# Patient Record
Sex: Male | Born: 1945 | Race: Black or African American | Hispanic: No | Marital: Married | Smoking: Former smoker
Health system: Southern US, Community
[De-identification: ages and names within clinical notes are randomized; demographics above are authoritative.]

## PROBLEM LIST (undated history)

## (undated) DIAGNOSIS — N189 Chronic kidney disease, unspecified: Secondary | ICD-10-CM

## (undated) DIAGNOSIS — E785 Hyperlipidemia, unspecified: Secondary | ICD-10-CM

## (undated) DIAGNOSIS — I2699 Other pulmonary embolism without acute cor pulmonale: Secondary | ICD-10-CM

## (undated) DIAGNOSIS — I10 Essential (primary) hypertension: Secondary | ICD-10-CM

## (undated) DIAGNOSIS — E119 Type 2 diabetes mellitus without complications: Secondary | ICD-10-CM

## (undated) DIAGNOSIS — I509 Heart failure, unspecified: Secondary | ICD-10-CM

## (undated) HISTORY — PX: OTHER SURGICAL HISTORY: SHX169

## (undated) HISTORY — PX: SKIN GRAFT: SHX250

## (undated) HISTORY — PX: KNEE SURGERY: SHX244

---

## 1999-02-28 ENCOUNTER — Encounter: Payer: Self-pay | Admitting: Family Medicine

## 1999-02-28 ENCOUNTER — Ambulatory Visit (HOSPITAL_COMMUNITY): Admission: RE | Admit: 1999-02-28 | Discharge: 1999-02-28 | Payer: Self-pay | Admitting: Family Medicine

## 2005-05-25 ENCOUNTER — Emergency Department (HOSPITAL_COMMUNITY): Admission: EM | Admit: 2005-05-25 | Discharge: 2005-05-25 | Payer: Self-pay | Admitting: Emergency Medicine

## 2006-05-12 ENCOUNTER — Emergency Department (HOSPITAL_COMMUNITY): Admission: EM | Admit: 2006-05-12 | Discharge: 2006-05-12 | Payer: Self-pay | Admitting: Emergency Medicine

## 2007-04-01 ENCOUNTER — Emergency Department (HOSPITAL_COMMUNITY): Admission: EM | Admit: 2007-04-01 | Discharge: 2007-04-01 | Payer: Self-pay | Admitting: Emergency Medicine

## 2009-02-13 ENCOUNTER — Emergency Department (HOSPITAL_COMMUNITY): Admission: EM | Admit: 2009-02-13 | Discharge: 2009-02-13 | Payer: Self-pay | Admitting: Emergency Medicine

## 2009-12-29 ENCOUNTER — Emergency Department (HOSPITAL_COMMUNITY): Admission: EM | Admit: 2009-12-29 | Discharge: 2009-12-30 | Payer: Self-pay | Admitting: Emergency Medicine

## 2010-05-26 ENCOUNTER — Encounter: Payer: Self-pay | Admitting: Cardiovascular Disease

## 2010-05-26 ENCOUNTER — Ambulatory Visit: Payer: Self-pay

## 2010-06-30 ENCOUNTER — Encounter: Payer: Self-pay | Admitting: Family Medicine

## 2010-07-10 NOTE — Miscellaneous (Signed)
Summary: Orders Update  Clinical Lists Changes  Problems: Added new problem of DIABETES MELLITUS, WITH VASCULAR COMPLICATIONS (ICD-250.70) Orders: Added new Test order of Arterial Duplex Lower Extremity (Arterial Duplex Low) - Signed

## 2010-08-23 LAB — CBC
Hemoglobin: 15.4 g/dL (ref 13.0–17.0)
MCH: 27.5 pg (ref 26.0–34.0)
MCV: 80 fL (ref 78.0–100.0)
RBC: 5.61 MIL/uL (ref 4.22–5.81)
WBC: 8.6 10*3/uL (ref 4.0–10.5)

## 2010-08-23 LAB — COMPREHENSIVE METABOLIC PANEL
AST: 19 U/L (ref 0–37)
Alkaline Phosphatase: 97 U/L (ref 39–117)
CO2: 27 mEq/L (ref 19–32)
Chloride: 104 mEq/L (ref 96–112)
Creatinine, Ser: 1.14 mg/dL (ref 0.4–1.5)
GFR calc Af Amer: >60 mL/min (ref >60)
GFR calc non Af Amer: >60 mL/min (ref >60)
Potassium: 3.8 mEq/L (ref 3.5–5.1)
Total Bilirubin: 0.6 mg/dL (ref 0.3–1.2)

## 2010-08-23 LAB — DIFFERENTIAL
Basophils Absolute: 0.1 10*3/uL (ref 0.0–0.1)
Basophils Relative: 1 % (ref 0–1)
Eosinophils Absolute: 0.3 10*3/uL (ref 0.0–0.7)
Eosinophils Relative: 4 % (ref 0–5)
Lymphocytes Relative: 19 % (ref 12–46)

## 2010-08-23 LAB — URINALYSIS, ROUTINE W REFLEX MICROSCOPIC
Leukocytes, UA: NEGATIVE
Nitrite: NEGATIVE
Protein, ur: NEGATIVE mg/dL

## 2010-08-23 LAB — LIPASE, BLOOD: Lipase: 39 U/L (ref 11–59)

## 2010-08-23 LAB — GLUCOSE, CAPILLARY: Glucose-Capillary: 368 mg/dL — ABNORMAL HIGH (ref 70–99)

## 2011-03-18 LAB — CULTURE, ROUTINE-ABSCESS

## 2011-06-26 ENCOUNTER — Encounter (HOSPITAL_COMMUNITY): Payer: Self-pay | Admitting: *Deleted

## 2011-06-26 ENCOUNTER — Emergency Department (HOSPITAL_COMMUNITY): Payer: Medicare Other

## 2011-06-26 ENCOUNTER — Other Ambulatory Visit: Payer: Self-pay

## 2011-06-26 ENCOUNTER — Observation Stay (HOSPITAL_COMMUNITY)
Admission: EM | Admit: 2011-06-26 | Discharge: 2011-06-26 | Disposition: A | Discharge status: Home / Self Care | Payer: Medicare Other | Attending: Emergency Medicine | Admitting: Emergency Medicine

## 2011-06-26 DIAGNOSIS — R5381 Other malaise: Principal | ICD-10-CM | POA: Insufficient documentation

## 2011-06-26 DIAGNOSIS — R5383 Other fatigue: Secondary | ICD-10-CM | POA: Insufficient documentation

## 2011-06-26 DIAGNOSIS — K1370 Unspecified lesions of oral mucosa: Secondary | ICD-10-CM | POA: Insufficient documentation

## 2011-06-26 DIAGNOSIS — E785 Hyperlipidemia, unspecified: Secondary | ICD-10-CM | POA: Insufficient documentation

## 2011-06-26 DIAGNOSIS — R42 Dizziness and giddiness: Secondary | ICD-10-CM | POA: Insufficient documentation

## 2011-06-26 DIAGNOSIS — R0682 Tachypnea, not elsewhere classified: Secondary | ICD-10-CM | POA: Insufficient documentation

## 2011-06-26 DIAGNOSIS — I10 Essential (primary) hypertension: Secondary | ICD-10-CM | POA: Insufficient documentation

## 2011-06-26 DIAGNOSIS — Z79899 Other long term (current) drug therapy: Secondary | ICD-10-CM | POA: Insufficient documentation

## 2011-06-26 DIAGNOSIS — E119 Type 2 diabetes mellitus without complications: Secondary | ICD-10-CM | POA: Insufficient documentation

## 2011-06-26 HISTORY — DX: Essential (primary) hypertension: I10

## 2011-06-26 LAB — URINE MICROSCOPIC-ADD ON

## 2011-06-26 LAB — URINALYSIS, ROUTINE W REFLEX MICROSCOPIC
Glucose, UA: NEGATIVE mg/dL
Ketones, ur: 15 mg/dL — AB
Leukocytes, UA: NEGATIVE
Nitrite: NEGATIVE
Protein, ur: >300 mg/dL — AB
pH: 5.5 (ref 5.0–8.0)

## 2011-06-26 LAB — COMPREHENSIVE METABOLIC PANEL
ALT: 18 U/L (ref 0–53)
Alkaline Phosphatase: 70 U/L (ref 39–117)
BUN: 11 mg/dL (ref 6–23)
Chloride: 105 mEq/L (ref 96–112)
GFR calc Af Amer: 83 mL/min — ABNORMAL LOW (ref >90)
Glucose, Bld: 105 mg/dL — ABNORMAL HIGH (ref 70–99)
Potassium: 3.3 mEq/L — ABNORMAL LOW (ref 3.5–5.1)
Sodium: 144 mEq/L (ref 135–145)
Total Bilirubin: 0.7 mg/dL (ref 0.3–1.2)
Total Protein: 6.7 g/dL (ref 6.0–8.3)

## 2011-06-26 LAB — DIFFERENTIAL
Lymphocytes Relative: 14 % (ref 12–46)
Lymphs Abs: 1.2 10*3/uL (ref 0.7–4.0)
Monocytes Relative: 7 % (ref 3–12)
Neutro Abs: 6.6 10*3/uL (ref 1.7–7.7)
Neutrophils Relative %: 77 % (ref 43–77)

## 2011-06-26 LAB — CBC
Hemoglobin: 14 g/dL (ref 13.0–17.0)
Platelets: 209 10*3/uL (ref 150–400)
RBC: 5.26 MIL/uL (ref 4.22–5.81)
WBC: 8.6 10*3/uL (ref 4.0–10.5)

## 2011-06-26 MED ORDER — DIAZEPAM 5 MG PO TABS: 5.0000 mg | ORAL_TABLET | Freq: Every evening | ORAL | Status: AC | PRN

## 2011-06-26 MED ORDER — FUROSEMIDE 10 MG/ML IJ SOLN
40.0000 mg | Freq: Once | INTRAMUSCULAR | Status: AC
  Administered 2011-06-26: 40 mg via INTRAVENOUS
  Filled 2011-06-26: qty 4

## 2011-06-26 MED ORDER — FUROSEMIDE 40 MG PO TABS: 40.0000 mg | ORAL_TABLET | Freq: Every day | ORAL | Status: DC

## 2011-06-26 MED ORDER — OLMESARTAN MEDOXOMIL 20 MG PO TABS: 20.0000 mg | ORAL_TABLET | Freq: Every day | ORAL | Status: DC

## 2011-06-26 MED ORDER — SODIUM CHLORIDE 0.9 % IV BOLUS (SEPSIS)
1000.0000 mL | Freq: Once | INTRAVENOUS | Status: DC
  Administered 2011-06-26: 1000 mL via INTRAVENOUS

## 2011-06-26 MED ORDER — OLMESARTAN MEDOXOMIL 20 MG PO TABS
20.0000 mg | ORAL_TABLET | Freq: Every day | ORAL | Status: DC
  Administered 2011-06-26: 20 mg via ORAL
  Filled 2011-06-26: qty 1

## 2011-06-26 NOTE — ED Notes (Signed)
Pt. Is unable to use the restroom at this time. 

## 2011-06-26 NOTE — ED Provider Notes (Signed)
History     CSN: [id number]  Arrival date & time 06/26/11  1335   First MD Initiated Contact with Patient 06/26/11 1343      Chief Complaint  Patient presents with  . Illness    generalized illness    (Consider location/radiation/quality/duration/timing/severity/associated sxs/prior treatment) HPI The patient presents with 2 weeks of generalized complaints.  He notes that he was in his usual state of health until the onset of symptoms.  Since onset he has been progressively more fatigued with generalized discomfort, anorexia.  He notes no changes in medications or diet.  He notes no relief with anything, nor any clear exacerbating factors.  He does complain of focal pain about his superior teeth.  He denies any fevers, chills, significant, dyspnea, chest pain, abdominal pain. He is incapable of describing all of his medications. Past Medical History  Diagnosis Date  . Diabetes mellitus   . Hypertension   . Hyperlipidemia     Past Surgical History  Procedure Date  . Right leg fracture   . Skin graft     No family history on file.  History  Substance Use Topics  . Smoking status: Never Smoker   . Smokeless tobacco: Not on file  . Alcohol Use: No      Review of Systems  Constitutional:       Per HPI, otherwise negative  HENT:       Per HPI, otherwise negative  Eyes: Negative.   Respiratory:       Per HPI, otherwise negative  Cardiovascular:       Per HPI, otherwise negative  Gastrointestinal: Negative for vomiting.  Genitourinary: Negative.   Musculoskeletal:       Per HPI, otherwise negative  Skin: Negative.   Neurological: Negative for syncope.    Allergies  Review of patient's allergies indicates no known allergies.  Home Medications   Current Outpatient Rx  Name Route Sig Dispense Refill  . METFORMIN HCL 1000 MG PO TABS Oral Take 1,000 mg by mouth 3 (three) times daily.    Marland Kitchen SITAGLIPTIN PHOSPHATE 100 MG PO TABS Oral Take 100 mg by mouth daily.        BP 198/111  Pulse 96  Temp(Src) 97.7 F (36.5 C) (Oral)  Resp 24  SpO2 100%  Physical Exam  Nursing note and vitals reviewed. Constitutional: He is oriented to person, place, and time. He appears well-developed. No distress.  HENT:  Head: Normocephalic and atraumatic.  Eyes: Conjunctivae and EOM are normal.  Cardiovascular: Normal rate and regular rhythm.   Pulmonary/Chest: Effort normal. No stridor. No respiratory distress.  Abdominal: He exhibits no distension.  Musculoskeletal: He exhibits no edema.  Neurological: He is alert and oriented to person, place, and time.  Skin: Skin is warm and dry.  Psychiatric: He has a normal mood and affect.    ED Course  Procedures (including critical care time)   Labs Reviewed  CBC  DIFFERENTIAL  COMPREHENSIVE METABOLIC PANEL  URINALYSIS, ROUTINE W REFLEX MICROSCOPIC  TROPONIN I  PRO B NATRIURETIC PEPTIDE   Dg Chest 2 View  06/26/2011  *RADIOLOGY REPORT*  Clinical Data: Short of breath and cough.  Chest discomfort.  CHEST - 2 VIEW  Comparison: 02/28/1999  Findings: The cardiac silhouette is enlarged.  Mediastinal shadows are normal.  There is venous hypertension with mild interstitial edema.  There are bilateral effusions, larger on the right than the left, with basilar atelectasis.  No significant bony finding.  IMPRESSION: Congestive heart failure  with mild interstitial edema and bilateral effusions with associated basilar volume loss, right more than left.  Original Report Authenticated By: Thomasenia Sales, M.D.   Ct Head Wo Contrast  06/26/2011  *RADIOLOGY REPORT*  Clinical Data: Altered mental status, generalized weakness, dizziness  CT HEAD WITHOUT CONTRAST  Technique:  Contiguous axial images were obtained from the base of the skull through the vertex without contrast.  Comparison: None.  Findings: The ventricular system is within normal limits in size. There is mild cortical atrophy present and the septum is midline in position.   Benign bilateral basal ganglial calcifications are noted.  There is mild small vessel ischemic change in the periventricular white matter.  No hemorrhage, mass lesion, or acute infarction is seen.  On bone window images, no calvarial abnormality is noted.  The paranasal sinuses are clear.  IMPRESSION: Mild atrophy and small vessel disease.  No acute intracranial abnormality.  Original Report Authenticated By: Juline Patch, M.D.   X-rays, reviewed by me.  Chest x-ray consistent with heart failure, abnormal   Date: 06/26/2011  Rate: 95  Rhythm: normal sinus rhythm  QRS Axis: normal  Intervals: normal  ST/T Wave abnormalities: normal  Conduction Disutrbances:repol abnormality  Narrative Interpretation:   Old EKG Reviewed: none available ABNORMAL ECG   No diagnosis found. Cardiac monitor: 89 SR - normal Pulse ox 100%ra- normal  3:42 PM Patient notes improvement in his Sx, and wants to go home.  The patient's wife arrived and notes that he has been very poor with medication compliance.   MDM  This 6 roll male with multiple medical problems, seemingly poor medication compliance now presents with several weeks of fatigue.  On exam the patient is in no distress, mildly tachypneic, not hypoxic, nor in any pain.  The patient's labs are suggestive of heart failure, although the patient denies this, he does note a prior history of "fluid problems."  Given the patient's indication of complaints.  He was restarted on Lasix, advised to restart his Benicar as well.  The patient requested discharge with PMD followup.  This was accommodated and the patient was discharged in stable condition.        Gerhard Munch, MD 06/26/11 1544

## 2011-06-26 NOTE — ED Notes (Signed)
[id number]<QM> Expected date:06/26/11<BR> Expected time: 1:21 PM<BR> Means of arrival:Ambulance<BR> Comments:<BR> EMS 31 GC - general illness

## 2011-06-26 NOTE — ED Notes (Signed)
Pt to xray

## 2011-06-26 NOTE — ED Notes (Signed)
When pharmacy tech was in room talking with pt, pt stated that he stopped taking his blood pressure medicine a few months ago because "it was not working".

## 2011-06-26 NOTE — ED Notes (Signed)
Per ems pt is from home, alert and oriented x4, ambulatory. Pt has been sick/ not feeling well for last 2 weeks. Pt has not been eating or drinking for last 2 weeks, reports his "teeth have been hurting". One front tooth is very loose. Denies n/v. Wife left urine in toilet for ems to see, urine dark, darker than sweet tea color. Productive yellow cough. Slightly short of breath, lung sounds clear. Short of breath for last few days.

## 2011-08-03 ENCOUNTER — Other Ambulatory Visit: Payer: Self-pay

## 2011-08-03 ENCOUNTER — Emergency Department (HOSPITAL_COMMUNITY): Payer: Medicare Other

## 2011-08-03 ENCOUNTER — Inpatient Hospital Stay (HOSPITAL_COMMUNITY)
Admission: EM | Admit: 2011-08-03 | Discharge: 2011-08-07 | DRG: 293 | Disposition: A | Discharge status: Home / Self Care | Payer: Medicare Other | Attending: Cardiology | Admitting: Cardiology

## 2011-08-03 DIAGNOSIS — Z7982 Long term (current) use of aspirin: Secondary | ICD-10-CM

## 2011-08-03 DIAGNOSIS — E7800 Pure hypercholesterolemia, unspecified: Secondary | ICD-10-CM | POA: Diagnosis present

## 2011-08-03 DIAGNOSIS — I5021 Acute systolic (congestive) heart failure: Secondary | ICD-10-CM | POA: Diagnosis present

## 2011-08-03 DIAGNOSIS — I509 Heart failure, unspecified: Secondary | ICD-10-CM | POA: Diagnosis present

## 2011-08-03 DIAGNOSIS — E119 Type 2 diabetes mellitus without complications: Secondary | ICD-10-CM | POA: Diagnosis present

## 2011-08-03 DIAGNOSIS — Z6833 Body mass index (BMI) 33.0-33.9, adult: Secondary | ICD-10-CM

## 2011-08-03 DIAGNOSIS — I110 Hypertensive heart disease with heart failure: Principal | ICD-10-CM | POA: Diagnosis present

## 2011-08-03 DIAGNOSIS — E876 Hypokalemia: Secondary | ICD-10-CM | POA: Diagnosis not present

## 2011-08-03 LAB — CBC
MCH: 26.9 pg (ref 26.0–34.0)
MCHC: 33.2 g/dL (ref 30.0–36.0)
MCV: 81.1 fL (ref 78.0–100.0)
Platelets: 236 10*3/uL (ref 150–400)
RDW: 16.1 % — ABNORMAL HIGH (ref 11.5–15.5)
WBC: 10.6 10*3/uL — ABNORMAL HIGH (ref 4.0–10.5)

## 2011-08-03 LAB — POCT I-STAT TROPONIN I

## 2011-08-03 MED ORDER — ASPIRIN 81 MG PO CHEW: 324.0000 mg | CHEWABLE_TABLET | Freq: Once | ORAL | Status: DC

## 2011-08-03 MED ORDER — NITROGLYCERIN 0.4 MG SL SUBL: 0.4000 mg | SUBLINGUAL_TABLET | SUBLINGUAL | Status: DC | PRN

## 2011-08-03 NOTE — ED Notes (Signed)
Pt c/o intermittent chest tightness x two months with mild shortness of breath. Has been seen for same multiple times.  Pt has mild LE edema and some abdominal distention noted by ems. Pt also noted hypertensive. Two NTG given without a change in pain.  Pt also received 324mg  of ASA. Sinus tach noted on 12 lead.

## 2011-08-04 ENCOUNTER — Encounter (HOSPITAL_COMMUNITY): Payer: Self-pay | Admitting: Internal Medicine

## 2011-08-04 LAB — COMPREHENSIVE METABOLIC PANEL
AST: 23 U/L (ref 0–37)
Albumin: 3.2 g/dL — ABNORMAL LOW (ref 3.5–5.2)
Calcium: 9.1 mg/dL (ref 8.4–10.5)
Chloride: 109 mEq/L (ref 96–112)
Creatinine, Ser: 1.12 mg/dL (ref 0.50–1.35)
Total Protein: 6.7 g/dL (ref 6.0–8.3)

## 2011-08-04 LAB — CBC
HCT: 43.7 % (ref 39.0–52.0)
Hemoglobin: 14.4 g/dL (ref 13.0–17.0)
RDW: 16.2 % — ABNORMAL HIGH (ref 11.5–15.5)
WBC: 10 10*3/uL (ref 4.0–10.5)

## 2011-08-04 LAB — CARDIAC PANEL(CRET KIN+CKTOT+MB+TROPI)
CK, MB: 5.7 ng/mL — ABNORMAL HIGH (ref 0.3–4.0)
Relative Index: 5.2 — ABNORMAL HIGH (ref 0.0–2.5)
Relative Index: 4.7 — ABNORMAL HIGH (ref 0.0–2.5)
Total CK: 110 U/L (ref 7–232)
Total CK: 113 U/L (ref 7–232)
Total CK: 108 U/L (ref 7–232)
Troponin I: <0.3 ng/mL (ref <0.30)

## 2011-08-04 LAB — PRO B NATRIURETIC PEPTIDE: Pro B Natriuretic peptide (BNP): 8213 pg/mL — ABNORMAL HIGH (ref 0–125)

## 2011-08-04 LAB — CREATININE, SERUM
GFR calc Af Amer: 78 mL/min — ABNORMAL LOW (ref >90)
GFR calc non Af Amer: 67 mL/min — ABNORMAL LOW (ref >90)

## 2011-08-04 MED ORDER — MOXIFLOXACIN HCL IN NACL 400 MG/250ML IV SOLN
400.0000 mg | Freq: Once | INTRAVENOUS | Status: AC
  Administered 2011-08-04: 400 mg via INTRAVENOUS
  Filled 2011-08-04: qty 250

## 2011-08-04 MED ORDER — SODIUM CHLORIDE 0.9 % IV SOLN
250.0000 mL | INTRAVENOUS | Status: DC | PRN
  Administered 2011-08-04: 250 mL via INTRAVENOUS

## 2011-08-04 MED ORDER — FUROSEMIDE 10 MG/ML IJ SOLN
40.0000 mg | Freq: Two times a day (BID) | INTRAMUSCULAR | Status: DC
  Administered 2011-08-04 – 2011-08-07 (×7): 40 mg via INTRAVENOUS
  Filled 2011-08-04 (×9): qty 4

## 2011-08-04 MED ORDER — LINAGLIPTIN 5 MG PO TABS
5.0000 mg | ORAL_TABLET | Freq: Every day | ORAL | Status: DC
  Administered 2011-08-04 – 2011-08-07 (×4): 5 mg via ORAL
  Filled 2011-08-04 (×4): qty 1

## 2011-08-04 MED ORDER — ONDANSETRON HCL 4 MG/2ML IJ SOLN: 4.0000 mg | Freq: Four times a day (QID) | INTRAMUSCULAR | Status: DC | PRN

## 2011-08-04 MED ORDER — ACETAMINOPHEN 325 MG PO TABS: 650.0000 mg | ORAL_TABLET | ORAL | Status: DC | PRN

## 2011-08-04 MED ORDER — NITROGLYCERIN IN D5W 200-5 MCG/ML-% IV SOLN
5.0000 ug/min | INTRAVENOUS | Status: DC
Start: 2011-08-04 — End: 2011-08-04
  Administered 2011-08-04: 10 ug/min via INTRAVENOUS
  Administered 2011-08-04: 5 ug/min via INTRAVENOUS
  Filled 2011-08-04: qty 250

## 2011-08-04 MED ORDER — SODIUM CHLORIDE 0.9 % IJ SOLN
3.0000 mL | Freq: Two times a day (BID) | INTRAMUSCULAR | Status: DC
  Administered 2011-08-04 – 2011-08-07 (×7): 3 mL via INTRAVENOUS

## 2011-08-04 MED ORDER — SPIRONOLACTONE 25 MG PO TABS
25.0000 mg | ORAL_TABLET | Freq: Every day | ORAL | Status: DC
  Administered 2011-08-05: 25 mg via ORAL
  Filled 2011-08-04: qty 1

## 2011-08-04 MED ORDER — SODIUM CHLORIDE 0.9 % IJ SOLN: 3.0000 mL | INTRAMUSCULAR | Status: DC | PRN

## 2011-08-04 MED ORDER — RAMIPRIL 5 MG PO CAPS
5.0000 mg | ORAL_CAPSULE | Freq: Two times a day (BID) | ORAL | Status: DC
  Administered 2011-08-05 – 2011-08-06 (×4): 5 mg via ORAL
  Filled 2011-08-04 (×5): qty 1

## 2011-08-04 MED ORDER — IRBESARTAN 75 MG PO TABS
75.0000 mg | ORAL_TABLET | Freq: Every day | ORAL | Status: DC
  Administered 2011-08-04 – 2011-08-07 (×4): 75 mg via ORAL
  Filled 2011-08-04 (×8): qty 1

## 2011-08-04 MED ORDER — NITROGLYCERIN IN D5W 200-5 MCG/ML-% IV SOLN: 5.0000 ug/min | INTRAVENOUS | Status: DC

## 2011-08-04 MED ORDER — METOPROLOL TARTRATE 12.5 MG HALF TABLET
12.5000 mg | ORAL_TABLET | Freq: Two times a day (BID) | ORAL | Status: DC
  Administered 2011-08-04 (×2): 12.5 mg via ORAL
  Filled 2011-08-04 (×2): qty 1

## 2011-08-04 MED ORDER — FUROSEMIDE 10 MG/ML IJ SOLN
40.0000 mg | Freq: Once | INTRAMUSCULAR | Status: AC
  Administered 2011-08-04: 40 mg via INTRAVENOUS
  Filled 2011-08-04: qty 4

## 2011-08-04 MED ORDER — CARVEDILOL 12.5 MG PO TABS
12.5000 mg | ORAL_TABLET | Freq: Two times a day (BID) | ORAL | Status: DC
  Administered 2011-08-05 – 2011-08-07 (×5): 12.5 mg via ORAL
  Filled 2011-08-04 (×9): qty 1

## 2011-08-04 MED ORDER — ASPIRIN EC 81 MG PO TBEC
81.0000 mg | DELAYED_RELEASE_TABLET | Freq: Every day | ORAL | Status: DC
  Administered 2011-08-04 – 2011-08-07 (×4): 81 mg via ORAL
  Filled 2011-08-04 (×5): qty 1

## 2011-08-04 MED ORDER — ENOXAPARIN SODIUM 40 MG/0.4ML ~~LOC~~ SOLN
40.0000 mg | SUBCUTANEOUS | Status: DC
  Administered 2011-08-04 – 2011-08-07 (×4): 40 mg via SUBCUTANEOUS
  Filled 2011-08-04 (×4): qty 0.4

## 2011-08-04 NOTE — Progress Notes (Signed)
*  PRELIMINARY RESULTS* Echocardiogram 2D Echocardiogram has been performed.  Jeryl Columbia R 08/04/2011, 3:20 PM

## 2011-08-04 NOTE — Progress Notes (Signed)
Nursing Note: Pt's blood pressure 180/97. Pt has no complaints of chest pain or other complaints at this time. MD made aware of pt's blood pressure. No orders received. Will continue to monitor pt. Kunio Cummiskey Scientist, clinical (histocompatibility and immunogenetics).

## 2011-08-04 NOTE — ED Provider Notes (Signed)
History     CSN: [id number]  Arrival date & time 08/03/11  2330   First MD Initiated Contact with Patient 08/03/11 2333      No chief complaint on file.   (Consider location/radiation/quality/duration/timing/severity/associated sxs/prior treatment) The history is provided by the patient and the spouse.   not feeling well the last few weeks. Chief complaint tonight is shortness of breath worse the last 24 hours. He denies any cough or fevers. He has noticed some lower extremity swelling and is worried that he may have heart failure without history of same. Some chest pain tonight. Dyspnea worse with exertion and lying flat. He called EMS tonight and received aspirin and nitroglycerin in route. Nitroglycerin did improve pain, he still feels short of breath. He is [facility] as prescribed Lasix and Benicar. He is a type II diabetic. No history of MI or known heart disease. Symptoms moderate to severe, somewhat improved with oxygen. Symptoms constant and worsening.  Past Medical History  Diagnosis Date  . Diabetes mellitus   . Hypertension   . Hyperlipidemia     Past Surgical History  Procedure Date  . Right leg fracture   . Skin graft     No family history on file.  History  Substance Use Topics  . Smoking status: Never Smoker   . Smokeless tobacco: Not on file  . Alcohol Use: No      Review of Systems  Constitutional: Negative for fever and chills.  HENT: Negative for neck pain and neck stiffness.   Eyes: Negative for pain.  Respiratory: Positive for shortness of breath. Negative for cough and wheezing.   Cardiovascular: Positive for chest pain and leg swelling. Negative for palpitations.  Gastrointestinal: Negative for abdominal pain.  Genitourinary: Negative for dysuria.  Musculoskeletal: Negative for back pain.  Skin: Negative for rash.  Neurological: Negative for headaches.  All other systems reviewed and are negative.    Allergies  Review  of patient's allergies indicates no known allergies.  Home Medications   Current Outpatient Rx  Name Route Sig Dispense Refill  . FUROSEMIDE 40 MG PO TABS Oral Take 1 tablet (40 mg total) by mouth daily. 15 tablet 0  . IBUPROFEN 200 MG PO TABS Oral Take 400 mg by mouth every 6 (six) hours as needed. For pain    . OLMESARTAN MEDOXOMIL 20 MG PO TABS Oral Take 1 tablet (20 mg total) by mouth daily. 20 tablet 0  . SITAGLIPTIN PHOSPHATE 100 MG PO TABS Oral Take 100 mg by mouth daily.      BP 191/128  Temp(Src) 98.3 F (36.8 C) (Oral)  Resp 24  SpO2 100%  Physical Exam  Constitutional: He is oriented to person, place, and time. He appears well-developed and well-nourished.  HENT:  Head: Normocephalic and atraumatic.  Eyes: Conjunctivae and EOM are normal. Pupils are equal, round, and reactive to light.  Neck: Trachea normal. Neck supple. No thyromegaly present.  Cardiovascular: Normal rate, regular rhythm, S1 normal, S2 normal and normal pulses.     No systolic murmur is present   No diastolic murmur is present  Pulses:      Radial pulses are 2+ on the right side, and 2+ on the left side.  Pulmonary/Chest: He has no rhonchi.       Tachypnea with decreased breath sounds bilaterally more so on the right  Abdominal: Soft. Normal appearance and bowel sounds are normal. He exhibits no mass. There is no tenderness. There is  no rebound, no guarding, no CVA tenderness and negative Murphy's sign.  Musculoskeletal:       BLE:s Calves nontender, no cords or erythema, negative Homans sign. 2+ peripheral and symmetric pitting edema.  Neurological: He is alert and oriented to person, place, and time. He has normal strength. No cranial nerve deficit or sensory deficit. GCS eye subscore is 4. GCS verbal subscore is 5. GCS motor subscore is 6.  Skin: Skin is warm and dry. No rash noted. He is not diaphoretic.  Psychiatric: His speech is normal.       Cooperative and appropriate    ED Course    Procedures (including critical care time)  Labs Reviewed  CBC - Abnormal; Notable for the following:    WBC 10.6 (*)    RDW 16.1 (*)    All other components within normal limits  COMPREHENSIVE METABOLIC PANEL - Abnormal; Notable for the following:    Sodium 148 (*)    Glucose, Bld 182 (*)    Albumin 3.2 (*)    GFR calc non Af Amer 67 (*)    GFR calc Af Amer 77 (*)    All other components within normal limits  POCT I-STAT TROPONIN I  PRO B NATRIURETIC PEPTIDE   Dg Chest Portable 1 View  08/04/2011  *RADIOLOGY REPORT*  Clinical Data: Worsening shortness of breath.  PORTABLE CHEST - 1 VIEW  Comparison: Chest radiograph performed 01/18 1013  Findings: There has been interval development of a large right- sided pleural effusion, with underlying airspace opacification.  A small left pleural effusion is also seen.  Given underlying vascular congestion and mildly increased interstitial markings, this could reflect interstitial edema, though pneumonia cannot be excluded.  No pneumothorax is identified.  The cardiomediastinal silhouette is mildly enlarged.  No acute osseous abnormalities are seen.  IMPRESSION:  1.  Significant interval increase in large right-sided pleural effusion, with underlying airspace opacification; increased small left pleural effusion also seen.  Given underlying vascular congestion and mildly increased interstitial markings, this could reflect interstitial edema, though pneumonia could have a similar appearance. 2.  Mild cardiomegaly noted.  Original Report Authenticated By: Tonia Ghent, M.D.    Date: 08/04/2011  Rate: 110  Rhythm: sinus tachycardia  QRS Axis: normal  Intervals: normal  ST/T Wave abnormalities: nonspecific ST/T changes  Conduction Disutrbances:none  Narrative Interpretation:   Old EKG Reviewed: none available  Nitro drip. Aspirin. Lasix. Labs and x-ray and EKG reviewed as above   MDM   Dyspnea with presentation concerning for new-onset heart  failure. X-ray discussed with radiologist as large right pleural effusion as above. No infectious symptoms. Nitroglycerin drip started. Lasix IV. Medicine admit. CAse discussed as above with [facility] hospitalist on-call Dr. Mikeal Hawthorne, who agrees to admission.        Sunnie Nielsen, MD 08/04/11 [phone]

## 2011-08-04 NOTE — Consult Note (Signed)
Reason for Consult: Decompensated systolic heart failure Referring Physician: [facility] hospitalist  [name] is an 66 y.o. male.  HPI: Patient is 66 year old black male with past medical history significant for hypertension insulin requiring diabetes mellitus hypercholesteremia morbid obesity came to ER complaining of progressive increasing shortness of breath associated with leg swelling and cough for last 1 week associated with generalized weakness. Patient also complains of the retrosternal and epigastric pain off and on without any associated symptoms. Patient denies any history of exertional chest pain but does give history of exertional dyspnea. Patient was noted to be in decompensated systolic heart failure 2-D echo done today showed EF of approximately 25-30%. Patient denies any her noninvasive or invasive cardiac workup in past. Patient states his blood pressure has always been elevated and had the problem controlling it. Cardiologic consultation is called regarding further management for heart failure and hypertensive urgency.  Past Medical History  Diagnosis Date  . Diabetes mellitus   . Hypertension   . Hyperlipidemia     Past Surgical History  Procedure Date  . Right leg fracture   . Skin graft     History reviewed. No pertinent family history.  Social History:  reports that he has never smoked. He does not have any smokeless tobacco history on file. He reports that he does not drink alcohol or use illicit drugs.  Allergies: No Known Allergies  Medications: I have reviewed the patient's current medications.  Results for orders placed during the hospital encounter of 08/03/11 (from the past 48 hour(s))  CBC     Status: Abnormal   Collection Time   08/03/11 11:38 PM      Component Value Range Comment   WBC 10.6 (*) 4.0 - 10.5 (K/uL)    RBC 5.50  4.22 - 5.81 (MIL/uL)    Hemoglobin 14.8  13.0 - 17.0 (g/dL)    HCT 32.4  40.1 - 02.7 (%)    MCV 81.1  78.0 - 100.0 (fL)    MCH 26.9  26.0 - 34.0 (pg)    MCHC 33.2  30.0 - 36.0 (g/dL)    RDW 25.3 (*) 66.4 - 15.5 (%)    Platelets 236  150 - 400 (K/uL)   COMPREHENSIVE METABOLIC PANEL     Status: Abnormal   Collection Time   08/03/11 11:38 PM      Component Value Range Comment   Sodium 148 (*) 135 - 145 (mEq/L)    Potassium 3.7  3.5 - 5.1 (mEq/L)    Chloride 109  96 - 112 (mEq/L)    CO2 32  19 - 32 (mEq/L)    Glucose, Bld 182 (*) 70 - 99 (mg/dL)    BUN 14  6 - 23 (mg/dL)    Creatinine, Ser 4.03  0.50 - 1.35 (mg/dL)    Calcium 9.1  8.4 - 10.5 (mg/dL)    Total Protein 6.7  6.0 - 8.3 (g/dL)    Albumin 3.2 (*) 3.5 - 5.2 (g/dL)    AST 23  0 - 37 (U/L) HEMOLYSIS AT THIS LEVEL MAY AFFECT RESULT   ALT 9  0 - 53 (U/L)    Alkaline Phosphatase 71  39 - 117 (U/L)    Total Bilirubin 0.6  0.3 - 1.2 (mg/dL)    GFR calc non Af Amer 67 (*) >90 (mL/min)    GFR calc Af Amer 77 (*) >90 (mL/min)   POCT I-STAT TROPONIN I     Status: Normal   Collection Time   08/03/11  11:46 PM      Component Value Range Comment   Troponin i, poc 0.01  0.00 - 0.08 (ng/mL)    Comment 3            PRO B NATRIURETIC PEPTIDE     Status: Abnormal   Collection Time   08/04/11 12:06 AM      Component Value Range Comment   Pro B Natriuretic peptide (BNP) 8213.0 (*) 0 - 125 (pg/mL)   TSH     Status: Normal   Collection Time   08/04/11  2:43 AM      Component Value Range Comment   TSH 4.060  0.350 - 4.500 (uIU/mL)   MAGNESIUM     Status: Normal   Collection Time   08/04/11  2:43 AM      Component Value Range Comment   Magnesium 2.2  1.5 - 2.5 (mg/dL)   CARDIAC PANEL(CRET KIN+CKTOT+MB+TROPI)     Status: Abnormal   Collection Time   08/04/11  2:43 AM      Component Value Range Comment   Total CK 110  7 - 232 (U/L)    CK, MB 5.7 (*) 0.3 - 4.0 (ng/mL)    Troponin I <0.30  <0.30 (ng/mL)    Relative Index 5.2 (*) 0.0 - 2.5    CBC     Status: Abnormal   Collection Time   08/04/11  2:43 AM      Component Value Range Comment   WBC 10.0  4.0 - 10.5  (K/uL)    RBC 5.40  4.22 - 5.81 (MIL/uL)    Hemoglobin 14.4  13.0 - 17.0 (g/dL)    HCT 40.9  81.1 - 91.4 (%)    MCV 80.9  78.0 - 100.0 (fL)    MCH 26.7  26.0 - 34.0 (pg)    MCHC 33.0  30.0 - 36.0 (g/dL)    RDW 78.2 (*) 95.6 - 15.5 (%)    Platelets 219  150 - 400 (K/uL)   CREATININE, SERUM     Status: Abnormal   Collection Time   08/04/11  2:43 AM      Component Value Range Comment   Creatinine, Ser 1.11  0.50 - 1.35 (mg/dL)    GFR calc non Af Amer 67 (*) >90 (mL/min)    GFR calc Af Amer 78 (*) >90 (mL/min)   CARDIAC PANEL(CRET KIN+CKTOT+MB+TROPI)     Status: Abnormal   Collection Time   08/04/11 10:50 AM      Component Value Range Comment   Total CK 113  7 - 232 (U/L)    CK, MB 5.9 (*) 0.3 - 4.0 (ng/mL)    Troponin I <0.30  <0.30 (ng/mL)    Relative Index 5.2 (*) 0.0 - 2.5    CARDIAC PANEL(CRET KIN+CKTOT+MB+TROPI)     Status: Abnormal   Collection Time   08/04/11  5:55 PM      Component Value Range Comment   Total CK 108  7 - 232 (U/L)    CK, MB 5.1 (*) 0.3 - 4.0 (ng/mL)    Troponin I <0.30  <0.30 (ng/mL)    Relative Index 4.7 (*) 0.0 - 2.5      Dg Chest Portable 1 View  08/04/2011  *RADIOLOGY REPORT*  Clinical Data: Worsening shortness of breath.  PORTABLE CHEST - 1 VIEW  Comparison: Chest radiograph performed 01/18 1013  Findings: There has been interval development of a large right- sided pleural effusion, with underlying airspace opacification.  A small  left pleural effusion is also seen.  Given underlying vascular congestion and mildly increased interstitial markings, this could reflect interstitial edema, though pneumonia cannot be excluded.  No pneumothorax is identified.  The cardiomediastinal silhouette is mildly enlarged.  No acute osseous abnormalities are seen.  IMPRESSION:  1.  Significant interval increase in large right-sided pleural effusion, with underlying airspace opacification; increased small left pleural effusion also seen.  Given underlying vascular congestion  and mildly increased interstitial markings, this could reflect interstitial edema, though pneumonia could have a similar appearance. 2.  Mild cardiomegaly noted.  Original Report Authenticated By: Tonia Ghent, M.D.    Review of Systems  Constitutional: Negative for fever and chills.  HENT: Negative for hearing loss and neck pain.   Respiratory: Positive for cough. Negative for hemoptysis and sputum production.   Cardiovascular: Positive for chest pain, orthopnea, leg swelling and PND. Negative for palpitations.  Gastrointestinal: Negative for nausea, vomiting and abdominal pain.  Neurological: Negative for dizziness and headaches.   Blood pressure 174/93, pulse 88, temperature 98.1 F (36.7 C), temperature source Oral, resp. rate 18, height 5\' 11"  (1.803 m), weight 171.2 kg (377 lb 6.8 oz), SpO2 94.00%. Physical Exam  Constitutional: He is oriented to person, place, and time. He appears well-nourished.  HENT:  Head: Normocephalic and atraumatic.  Mouth/Throat: No oropharyngeal exudate.  Eyes: Conjunctivae are normal. Pupils are equal, round, and reactive to light.  Neck: Normal range of motion. Neck supple. JVD present. No tracheal deviation present. No thyromegaly present.  Cardiovascular: Normal rate, regular rhythm and normal heart sounds.        Soft systolic murmur and S3 gallop noted  Respiratory:       Decreased breath sounds at bases with bibasilar rales noted  GI: Soft. Bowel sounds are normal. He exhibits distension. There is no tenderness. There is no rebound.  Musculoskeletal:       Negative for clubbing cyanosis 2+ edema  Lymphadenopathy:    He has no cervical adenopathy.  Neurological: He is alert and oriented to person, place, and time.    Assessment/Plan: Decompensated acute systolic heart failure Hypertensive heart disease with systolic dysfunction Insulin requiring diabetes mellitus Hypercholesteremia Morbid obesity Atypical chest pain Plan Will change  metoprolol to carvedilol per orders Add ACE inhibitors and Aldactone Monitor renal function Will need to repeat 2-D echo few months and his EF remains below 35% may need ICD We'll also need ischemic workup once fully compensated which can be done as outpatient.  Robynn Pane 08/04/2011, 10:49 PM

## 2011-08-04 NOTE — Progress Notes (Signed)
08/04/2011 Sumaiya Arruda [facility] Case Management Note [phone]  Utilization review completed.  

## 2011-08-04 NOTE — ED Notes (Signed)
Pt transported stable and in no acute distress via monitor with RN.

## 2011-08-04 NOTE — ED Notes (Signed)
Pt via ems to tx rm. Pt reports shortness of breath over two months. Seen for same in mid January and given lasix. Pt now out of same and shortness of breath is worsening. Pt also has 2+ pitting edema in lower extremities. Pt also reports his abdomen is distended. Pt placed on cardiac, bp and pulse ox monitor continuously. EKG obtained Placed on 2L [place] with O2 sats 98%. Pt notably dyspneic on exertion especially with repositioning in bed. Pt denies any pain. Pt alert and oriented. Wife to bedside. Iv placed. Portable chest xray at bedside.

## 2011-08-04 NOTE — Progress Notes (Signed)
Inpatient Diabetes Program Recommendations  AACE/ADA: New Consensus Statement on Inpatient Glycemic Control (2009)  Target Ranges:  Prepandial:   less than 140 mg/dL      Peak postprandial:   less than 180 mg/dL (1-2 hours)      Critically ill patients:  140 - 180 mg/dL   Reason for Visit: Elevated lab glucose  Inpatient Diabetes Program Recommendations Correction (SSI): Request MD consider ordering Novolog correction.  Needs order for CBG's ac & hs while eating and q 4 hours if NPO. Diet: Request MD consider either adding CHO Modifed Medium restriction to diet order-- or change diet to CHO Modified as it is heart healthy as well. Also, please consider ordering a Hbg A1C to assess prior glycemic control.  Note: The American Diabetes Association sets minimal standards of checking CBG's at least four times/day for patients with diabetes in the hospital setting.  Please consider completing the Glycemic Control Order Set to address above issues.  Thank you.

## 2011-08-04 NOTE — H&P (Signed)
Luisdaniel Kenton is an 66 y.o. male.   Chief Complaint: Shortness of Breath HPI: 66 YO man with history of DM2, HTN here with progressive SOB, cough and leg swelling. Was seen at [facility] before and told he might have CHF. Given some lasix but never had Echo. Now worsening shortness of breath and some chest pain. No fever or chills. Had some cough and PND, Orthopnea. Has been taking his prescribed medications. Was seen by [facility] cardiology 2 years ago with possible PVD. No significant Family history of CAD.  Past Medical History  Diagnosis Date  . Diabetes mellitus   . Hypertension   . Hyperlipidemia     Past Surgical History  Procedure Date  . Right leg fracture   . Skin graft     History reviewed. No pertinent family history. Social History:  reports that he has never smoked. He does not have any smokeless tobacco history on file. He reports that he does not drink alcohol or use illicit drugs.  Allergies: No Known Allergies  Medications Prior to Admission  Medication Dose Route Frequency Provider Last Rate Last Dose  . furosemide (LASIX) injection 40 mg  40 mg Intravenous Once Sunnie Nielsen, MD      . moxifloxacin (AVELOX) IVPB 400 mg  400 mg Intravenous Once Sunnie Nielsen, MD      . nitroGLYCERIN 0.2 mg/mL in dextrose 5 % infusion  5 mcg/min Intravenous Titrated Sunnie Nielsen, MD 3 mL/hr at 08/04/11 0113 10 mcg/min at 08/04/11 0113  . DISCONTD: aspirin chewable tablet 324 mg  324 mg Oral Once Sunnie Nielsen, MD      . DISCONTD: nitroGLYCERIN (NITROSTAT) SL tablet 0.4 mg  0.4 mg Sublingual Q5 min PRN Sunnie Nielsen, MD       Medications Prior to Admission  Medication Sig Dispense Refill  . ibuprofen (ADVIL,MOTRIN) 200 MG tablet Take 400 mg by mouth every 6 (six) hours as needed. For pain      . metFORMIN (GLUCOPHAGE) 500 MG tablet Take 1,000 mg by mouth at bedtime.      Marland Kitchen olmesartan (BENICAR) 20 MG tablet Take 1 tablet (20 mg total) by mouth daily.  20 tablet  0  . sitaGLIPtin (JANUVIA)  100 MG tablet Take 100 mg by mouth daily.        Results for orders placed during the hospital encounter of 08/03/11 (from the past 48 hour(s))  CBC     Status: Abnormal   Collection Time   08/03/11 11:38 PM      Component Value Range Comment   WBC 10.6 (*) 4.0 - 10.5 (K/uL)    RBC 5.50  4.22 - 5.81 (MIL/uL)    Hemoglobin 14.8  13.0 - 17.0 (g/dL)    HCT 04.5  40.9 - 81.1 (%)    MCV 81.1  78.0 - 100.0 (fL)    MCH 26.9  26.0 - 34.0 (pg)    MCHC 33.2  30.0 - 36.0 (g/dL)    RDW 91.4 (*) 78.2 - 15.5 (%)    Platelets 236  150 - 400 (K/uL)   COMPREHENSIVE METABOLIC PANEL     Status: Abnormal   Collection Time   08/03/11 11:38 PM      Component Value Range Comment   Sodium 148 (*) 135 - 145 (mEq/L)    Potassium 3.7  3.5 - 5.1 (mEq/L)    Chloride 109  96 - 112 (mEq/L)    CO2 32  19 - 32 (mEq/L)    Glucose, Bld 182 (*)  70 - 99 (mg/dL)    BUN 14  6 - 23 (mg/dL)    Creatinine, Ser 1.61  0.50 - 1.35 (mg/dL)    Calcium 9.1  8.4 - 10.5 (mg/dL)    Total Protein 6.7  6.0 - 8.3 (g/dL)    Albumin 3.2 (*) 3.5 - 5.2 (g/dL)    AST 23  0 - 37 (U/L) HEMOLYSIS AT THIS LEVEL MAY AFFECT RESULT   ALT 9  0 - 53 (U/L)    Alkaline Phosphatase 71  39 - 117 (U/L)    Total Bilirubin 0.6  0.3 - 1.2 (mg/dL)    GFR calc non Af Amer 67 (*) >90 (mL/min)    GFR calc Af Amer 77 (*) >90 (mL/min)   POCT I-STAT TROPONIN I     Status: Normal   Collection Time   08/03/11 11:46 PM      Component Value Range Comment   Troponin i, poc 0.01  0.00 - 0.08 (ng/mL)    Comment 3            PRO B NATRIURETIC PEPTIDE     Status: Abnormal   Collection Time   08/04/11 12:06 AM      Component Value Range Comment   Pro B Natriuretic peptide (BNP) 8213.0 (*) 0 - 125 (pg/mL)    Dg Chest Portable 1 View  08/04/2011  *RADIOLOGY REPORT*  Clinical Data: Worsening shortness of breath.  PORTABLE CHEST - 1 VIEW  Comparison: Chest radiograph performed 01/18 1013  Findings: There has been interval development of a large right- sided pleural  effusion, with underlying airspace opacification.  A small left pleural effusion is also seen.  Given underlying vascular congestion and mildly increased interstitial markings, this could reflect interstitial edema, though pneumonia cannot be excluded.  No pneumothorax is identified.  The cardiomediastinal silhouette is mildly enlarged.  No acute osseous abnormalities are seen.  IMPRESSION:  1.  Significant interval increase in large right-sided pleural effusion, with underlying airspace opacification; increased small left pleural effusion also seen.  Given underlying vascular congestion and mildly increased interstitial markings, this could reflect interstitial edema, though pneumonia could have a similar appearance. 2.  Mild cardiomegaly noted.  Original Report Authenticated By: Tonia Ghent, M.D.    Review of Systems  Respiratory: Positive for shortness of breath. Negative for hemoptysis and wheezing.   Cardiovascular: Positive for chest pain, palpitations, orthopnea, leg swelling and PND.  All other systems reviewed and are negative.    Blood pressure 180/116, temperature 98.3 F (36.8 C), temperature source Oral, resp. rate 24, SpO2 100.00%. Physical Exam  Constitutional: He is oriented to person, place, and time. He appears well-developed and well-nourished.  HENT:  Head: Normocephalic and atraumatic.  Right Ear: External ear normal.  Left Ear: External ear normal.  Nose: Nose normal.  Mouth/Throat: Oropharynx is clear and moist.  Eyes: Conjunctivae and EOM are normal. Pupils are equal, round, and reactive to light.  Neck: Normal range of motion. Neck supple.  Cardiovascular: Normal rate, regular rhythm, S1 normal, normal heart sounds, intact distal pulses and normal pulses.   Occasional extrasystoles are present.    No systolic murmur is present   No diastolic murmur is present  Respiratory: Effort normal. He has rales. He exhibits no tenderness.  GI: Soft. Bowel sounds are normal.   Musculoskeletal: He exhibits edema. He exhibits no tenderness.  Neurological: He is alert and oriented to person, place, and time. He has normal reflexes.  Skin: Skin is warm and  dry.  Psychiatric: He has a normal mood and affect. His behavior is normal. Judgment and thought content normal.     Assessment/Plan 1. Acute CHF: Based on history, exam and findings, patient seems to be having newly diagnosed CHF. His pro-BNP is elevated and has some mild JVP. Will admit for full workup and get 2D echo, diurese patient and add B-Blocker to his ARB as well. He will need Cardiology consult before DC 2. 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 Will place on Nitro, titrate BB and ARB 3. DM: Will hold Metformin and continue Januvia with SSI 4. Bilateral Pleural Effusion: Probably due to CHF. If not improved with diuresis, will consider Thoracentesis especially on the right which is bigger.   Joany Khatib,LAWAL 08/04/2011, 1:28 AM

## 2011-08-04 NOTE — Progress Notes (Signed)
   Patient seen and examined, and data base reviewed.  Patient seen earlier today by my colleague Dr. Mikeal Hawthorne.  Acute CHF, with hypertensive urgency. Patient is on nitroglycerin drip and diuresis started.  I will consult cardiology on call.  Clint Lipps Pager: [phone] 08/04/2011, 11:27 AM

## 2011-08-05 LAB — CBC
Hemoglobin: 12.7 g/dL — ABNORMAL LOW (ref 13.0–17.0)
MCHC: 31.8 g/dL (ref 30.0–36.0)
Platelets: 183 10*3/uL (ref 150–400)
RDW: 15.9 % — ABNORMAL HIGH (ref 11.5–15.5)

## 2011-08-05 LAB — BASIC METABOLIC PANEL
BUN: 11 mg/dL (ref 6–23)
GFR calc Af Amer: 69 mL/min — ABNORMAL LOW (ref >90)
GFR calc non Af Amer: 59 mL/min — ABNORMAL LOW (ref >90)
Potassium: 3.1 mEq/L — ABNORMAL LOW (ref 3.5–5.1)

## 2011-08-05 MED ORDER — POTASSIUM CHLORIDE CRYS ER 20 MEQ PO TBCR
20.0000 meq | EXTENDED_RELEASE_TABLET | Freq: Two times a day (BID) | ORAL | Status: DC
  Administered 2011-08-05 (×2): 20 meq via ORAL
  Filled 2011-08-05 (×3): qty 1

## 2011-08-05 MED ORDER — SPIRONOLACTONE 25 MG PO TABS
25.0000 mg | ORAL_TABLET | Freq: Two times a day (BID) | ORAL | Status: DC
  Administered 2011-08-05 – 2011-08-07 (×4): 25 mg via ORAL
  Filled 2011-08-05 (×8): qty 1

## 2011-08-05 NOTE — Progress Notes (Signed)
Subjective:  Complains of feeling weak and tired. Denies any chest pain  breathing improved  Objective:  Vital Signs in the last 24 hours: Temp:  [97.3 F (36.3 C)-98.2 F (36.8 C)] 98.2 F (36.8 C) (02/27 0639) Pulse Rate:  [79-97] 82  (02/27 0639) Resp:  [16-18] 18  (02/27 0639) BP: (142-174)/(77-109) 142/80 mmHg (02/27 1124) SpO2:  [94 %-98 %] 98 % (02/27 0639) Weight:  [112.2 kg (247 lb 5.7 oz)] 112.2 kg (247 lb 5.7 oz) (02/27 0639)  Intake/Output from previous day: 02/26 0701 - 02/27 0700 In: 790.3 [P.O.:540; I.V.:242.3; IV Piggyback:8] Out: 7460 [Urine:7460] Intake/Output from this shift: Total I/O In: 340 [P.O.:340] Out: 200 [Urine:200]  Physical Exam: Neck: JVD - 6 cm above sternal notch, no adenopathy, no carotid bruit and supple, symmetrical, trachea midline Lungs: Decreased breath sounds at bases with bibasilar Rales Heart: regular rate and rhythm, S1, S2 normal and Soft systolic murmur and S3 gallop noted Abdomen: soft, non-tender; bowel sounds normal; no masses,  no organomegaly Extremities: No clubbing cyanosis 1+ edema noted  Lab Results:  Basename 08/05/11 0508 08/04/11 0243  WBC 8.4 10.0  HGB 12.7* 14.4  PLT 183 219    Basename 08/05/11 0508 08/04/11 0243 08/03/11 2338  NA 154* -- 148*  K 3.1* -- 3.7  CL 111 -- 109  CO2 35* -- 32  GLUCOSE 158* -- 182*  BUN 11 -- 14  CREATININE 1.23 1.11 --    Basename 08/04/11 1755 08/04/11 1050  TROPONINI <0.30 <0.30   Hepatic Function Panel  Basename 08/03/11 2338  PROT 6.7  ALBUMIN 3.2*  AST 23  ALT 9  ALKPHOS 71  BILITOT 0.6  BILIDIR --  IBILI --   No results found for this basename: CHOL in the last 72 hours No results found for this basename: PROTIME in the last 72 hours  Imaging: Imaging results have been reviewed and Dg Chest Portable 1 View  08/04/2011  *RADIOLOGY REPORT*  Clinical Data: Worsening shortness of breath.  PORTABLE CHEST - 1 VIEW  Comparison: Chest radiograph performed 01/18  1013  Findings: There has been interval development of a large right- sided pleural effusion, with underlying airspace opacification.  A small left pleural effusion is also seen.  Given underlying vascular congestion and mildly increased interstitial markings, this could reflect interstitial edema, though pneumonia cannot be excluded.  No pneumothorax is identified.  The cardiomediastinal silhouette is mildly enlarged.  No acute osseous abnormalities are seen.  IMPRESSION:  1.  Significant interval increase in large right-sided pleural effusion, with underlying airspace opacification; increased small left pleural effusion also seen.  Given underlying vascular congestion and mildly increased interstitial markings, this could reflect interstitial edema, though pneumonia could have a similar appearance. 2.  Mild cardiomegaly noted.  Original Report Authenticated By: Tonia Ghent, M.D.    Cardiac Studies:  Assessment/Plan:  Decompensated acute systolic heart failure clinically improved Hypertensive heart disease with systolic dysfunction Insulin requiring diabetes mellitus Hypercholesteremia Morbid obesity Status post atypical chest pain Plan DC IV nitroglycerin Increase Aldactone per orders  LOS: 2 days    [name] N 08/05/2011, 12:55 PM

## 2011-08-05 NOTE — Plan of Care (Signed)
Problem: Consults Goal: Heart Failure Patient Education (See Patient Education module for education specifics.)  Outcome: Adequate for Discharge Pt given book he does not want to read it. Verbally went over sodium restriction & daily weights. Wife took book home to read. Pt says he cannot live without salt. Marisa Cyphers RN

## 2011-08-06 LAB — BASIC METABOLIC PANEL
CO2: 36 mEq/L — ABNORMAL HIGH (ref 19–32)
Glucose, Bld: 119 mg/dL — ABNORMAL HIGH (ref 70–99)
Potassium: 2.9 mEq/L — ABNORMAL LOW (ref 3.5–5.1)
Sodium: 149 mEq/L — ABNORMAL HIGH (ref 135–145)

## 2011-08-06 MED ORDER — RAMIPRIL 10 MG PO CAPS
10.0000 mg | ORAL_CAPSULE | Freq: Two times a day (BID) | ORAL | Status: DC
  Administered 2011-08-06 – 2011-08-07 (×2): 10 mg via ORAL
  Filled 2011-08-06 (×5): qty 1

## 2011-08-06 MED ORDER — POTASSIUM CHLORIDE CRYS ER 20 MEQ PO TBCR
40.0000 meq | EXTENDED_RELEASE_TABLET | Freq: Three times a day (TID) | ORAL | Status: DC
  Administered 2011-08-06 – 2011-08-07 (×4): 40 meq via ORAL
  Filled 2011-08-06 (×8): qty 2

## 2011-08-06 NOTE — Progress Notes (Signed)
Chaplain returned per pt request. Talking with pt in room. Earlier today pt and wife were arguing verbally - Pt became loud and told wife to leave. Wife left- called later to check on husband. Marisa Cyphers RN

## 2011-08-06 NOTE — Progress Notes (Signed)
Chaplain responded to a patient request for a visit. Chaplain attempted a visit, but patient was sleeping. Chaplain will try again at a later time.

## 2011-08-06 NOTE — Progress Notes (Signed)
Subjective:  Patient denies any chest pain states breathing has improved  Objective:  Vital Signs in the last 24 hours: Temp:  [98 F (36.7 C)-98.8 F (37.1 C)] 98.7 F (37.1 C) (02/28 0700) Pulse Rate:  [79-82] 79  (02/28 0700) Resp:  [20-22] 22  (02/28 0700) BP: (132-174)/(70-96) 174/96 mmHg (02/28 0700) SpO2:  [97 %-99 %] 99 % (02/28 0700) Weight:  [110 kg (242 lb 8.1 oz)] 110 kg (242 lb 8.1 oz) (02/28 0700)  Intake/Output from previous day: 02/27 0701 - 02/28 0700 In: 830.5 [P.O.:820; I.V.:10.5] Out: 2021 [Urine:2020; Stool:1] Intake/Output from this shift: Total I/O In: 240 [P.O.:240] Out: -   Physical Exam: Neck: JVD - 6 cm above sternal notch, no adenopathy, no carotid bruit and supple, symmetrical, trachea midline Lungs: Decreased breath sounds at bases with basilar rates Heart: regular rate and rhythm, S1, S2 normal and Soft systolic murmur and S3 gallop noted Abdomen: soft, non-tender; bowel sounds normal; no masses,  no organomegaly Extremities: No clubbing cyanosis trace edema noted  Lab Results:  Basename 08/05/11 0508 08/04/11 0243  WBC 8.4 10.0  HGB 12.7* 14.4  PLT 183 219    Basename 08/06/11 0515 08/05/11 0508  NA 149* 154*  K 2.9* 3.1*  CL 107 111  CO2 36* 35*  GLUCOSE 119* 158*  BUN 12 11  CREATININE 1.25 1.23    Basename 08/04/11 1755 08/04/11 1050  TROPONINI <0.30 <0.30   Hepatic Function Panel  Basename 08/03/11 2338  PROT 6.7  ALBUMIN 3.2*  AST 23  ALT 9  ALKPHOS 71  BILITOT 0.6  BILIDIR --  IBILI --   No results found for this basename: CHOL in the last 72 hours No results found for this basename: PROTIME in the last 72 hours  Imaging: Imaging results have been reviewed and No results found.  Cardiac Studies:  Assessment/Plan:  Resolving decompensated acute systolic heart failure Hypertensive heart disease with systolic dysfunction Insulin requiring diabetes mellitus Hypercholesteremia Morbid obesity Status post  atypical chest pain Hypokalemia Plan Replace K. Increase ACE inhibitor as per ordered Check labs in a.m.  LOS: 3 days    [name] N 08/06/2011, 11:12 AM

## 2011-08-07 ENCOUNTER — Inpatient Hospital Stay (HOSPITAL_COMMUNITY): Payer: Medicare Other

## 2011-08-07 LAB — BASIC METABOLIC PANEL
BUN: 15 mg/dL (ref 6–23)
Chloride: 107 mEq/L (ref 96–112)
GFR calc Af Amer: 76 mL/min — ABNORMAL LOW (ref >90)
Glucose, Bld: 111 mg/dL — ABNORMAL HIGH (ref 70–99)
Potassium: 3.6 mEq/L (ref 3.5–5.1)
Sodium: 148 mEq/L — ABNORMAL HIGH (ref 135–145)

## 2011-08-07 LAB — CBC
HCT: 37.1 % — ABNORMAL LOW (ref 39.0–52.0)
Hemoglobin: 11.6 g/dL — ABNORMAL LOW (ref 13.0–17.0)
MCH: 25.8 pg — ABNORMAL LOW (ref 26.0–34.0)
MCHC: 31.3 g/dL (ref 30.0–36.0)
RBC: 4.5 MIL/uL (ref 4.22–5.81)

## 2011-08-07 LAB — MAGNESIUM: Magnesium: 1.9 mg/dL (ref 1.5–2.5)

## 2011-08-07 MED ORDER — FUROSEMIDE 80 MG PO TABS: 80.0000 mg | ORAL_TABLET | Freq: Every day | ORAL | Status: DC

## 2011-08-07 MED ORDER — MOXIFLOXACIN HCL 400 MG PO TABS: 400.0000 mg | ORAL_TABLET | Freq: Every day | ORAL | Status: AC

## 2011-08-07 MED ORDER — SPIRONOLACTONE 25 MG PO TABS: 25.0000 mg | ORAL_TABLET | Freq: Two times a day (BID) | ORAL | Status: DC

## 2011-08-07 MED ORDER — ASPIRIN 81 MG PO TBEC: 81.0000 mg | DELAYED_RELEASE_TABLET | Freq: Every day | ORAL | Status: AC

## 2011-08-07 MED ORDER — POTASSIUM CHLORIDE CRYS ER 20 MEQ PO TBCR: 20.0000 meq | EXTENDED_RELEASE_TABLET | Freq: Every day | ORAL | Status: DC

## 2011-08-07 MED ORDER — MOXIFLOXACIN HCL 400 MG PO TABS
400.0000 mg | ORAL_TABLET | Freq: Every day | ORAL | Status: DC
  Filled 2011-08-07 (×2): qty 1

## 2011-08-07 MED ORDER — CARVEDILOL 25 MG PO TABS: 25.0000 mg | ORAL_TABLET | Freq: Two times a day (BID) | ORAL | Status: DC

## 2011-08-07 MED ORDER — RAMIPRIL 10 MG PO CAPS: 10.0000 mg | ORAL_CAPSULE | Freq: Two times a day (BID) | ORAL | Status: DC

## 2011-08-07 NOTE — Discharge Instructions (Signed)
Heart Failure Heart failure (HF) is a condition in which the heart has trouble pumping blood. This means your heart does not pump blood efficiently for your body to work well. In some cases of HF, fluid may back up into your lungs or you may have swelling (edema) in your lower legs. HF is a long-term (chronic) condition. It is important for you to take good care of yourself and follow your caregiver's treatment plan. CAUSES   Health conditions:   High blood pressure (hypertension) causes the heart muscle to work harder than normal. When pressure in the blood vessels is high, the heart needs to pump (contract) with more force in order to circulate blood throughout the body. High blood pressure eventually causes the heart to become stiff and weak.   Coronary artery disease (CAD) is the buildup of cholesterol and fat (plaques) in the arteries of the heart. The blockage in the arteries deprives the heart muscle of oxygen and blood. This can cause chest pain and may lead to a heart attack. High blood pressure can also contribute to CAD.   Heart attack (myocardial infarction) occurs when 1 or more arteries in the heart become blocked. The loss of oxygen damages the muscle tissue of the heart. When this happens, part of the heart muscle dies. The injured tissue does not contract as well and weakens the heart's ability to pump blood.   Abnormal heart valves can cause HF when the heart valves do not open and close properly. This makes the heart muscle pump harder to keep the blood flowing.   Heart muscle disease (cardiomyopathy or myocarditis) is damage to the heart muscle from a variety of causes. These can include drug or alcohol abuse, infections, or unknown reasons. These can increase the risk of HF.   Lung disease makes the heart work harder because the lungs do not work properly. This can cause a strain on the heart leading it to fail.   Diabetes increases the risk of HF. High blood sugar contributes  to high fat (lipid) levels in the blood. Diabetes can also cause slow damage to tiny blood vessels that carry important nutrients to the heart muscle. When the heart does not get enough oxygen and food, it can cause the heart to become weak and stiff. This leads to a heart that does not contract efficiently.   Other diseases can contribute to HF. These include abnormal heart rhythms, thyroid problems, and low blood counts (anemia).   Unhealthy lifestyle habits:   Obesity.   Smoking.   Eating foods high in fat and cholesterol.   Eating or drinking beverages high in salt.   Drug or alcohol abuse.   Lack of exercise.  SYMPTOMS  HF symptoms may vary and can be hard to detect. Symptoms may include:  Shortness of breath with activity, such as climbing stairs.   Persistent cough.   Swelling of the feet, ankles, legs, or abdomen.   Unexplained weight gain.   Difficulty breathing when lying flat.   Waking from sleep because of the need to sit up and get more air.   Rapid heartbeat.   Fatigue and loss of energy.   Feeling lightheaded or close to fainting.  DIAGNOSIS  A diagnosis of HF is based on your history, symptoms, physical examination, and diagnostic tests. Diagnostic tests for HF may include:  EKG.   Chest X-ray.   Blood tests.   Exercise stress test.   Blood oxygen test (arterial blood gas).   Evaluation   by a heart doctor (cardiologist).   Ultrasound evaluation of the heart (echocardiogram).   Heart artery test to look for blockages (angiogram).   Radioactive imaging to look at the heart (radionuclide test).  TREATMENT  Treatment is aimed at managing the symptoms of HF. Medicines, lifestyle changes, or surgical intervention may be necessary to treat HF.  Medicines to help treat HF may include:   Angiotensin-converting enzyme (ACE) inhibitors. These block the effects of a blood protein called angiotensin-converting enzyme. ACE inhibitors relax (dilate) the  blood vessels and help lower blood pressure. This decreases the workload of the heart, slows the progression of HF, and improves symptoms.   Angiotensin receptor blockers (ARBs). These medications work similar to ACE inhibitors. ARBs may be an alternative for people who cannot tolerate an ACE inhibitor.   Aldosterone antagonists. This medication helps get rid of extra fluid from your body. This lowers the volume of blood the heart has to pump.   Water pills (diuretics). Diuretics cause the kidneys to remove salt and water from the blood. The extra fluid is removed by urination. By removing extra fluid from the body, diuretics help lower the workload of the heart and help prevent fluid buildup in the lungs so breathing is easier.   Beta blockers. These prevent the heart from beating too fast and improve heart muscle strength. Beta blockers help maintain a normal heart rate, control blood pressure, and improve HF symptoms.   Digitalis. This increases the force of the heartbeat and may be helpful to people with HF or heart rhythm problems.   Healthy lifestyle changes include:   Stopping smoking.   Eating a healthy diet. Avoid foods high in fat. Avoid foods fried in oil or made with fat. A dietician can help with healthy food choices.   Limiting how much salt you eat.   Limiting alcohol intake to no more than 1 drink per day for women and 2 drinks per day for men. Drinking more than that is harmful to your heart. If your heart has already been damaged by alcohol or you have severe HF, drinking alcohol should be stopped completely.   Exercising as directed by your caregiver.   Surgical treatment for HF may include:   Procedures to open blocked arteries, repair damaged heart valves, or remove damaged heart muscle tissue.   A pacemaker to help heart muscle function and to control certain abnormal heart rhythms.   A defibrillator to possibly prevent sudden cardiac death.  HOME CARE  INSTRUCTIONS   Activity level. Your caregiver can help you determine what type of exercise program may be helpful. It is important to maintain your strength. Pace your physical activity to avoid shortness of breath or chest pain. Rest for 1 hour before and after meals. A cardiac rehabilitation program may be helpful to some people with HF.   Diet. Eat a heart healthy diet. Food choices should be low in saturated fat and cholesterol. Talk to a dietician to learn about heart healthy foods.   Salt intake. When you have HF, you need to limit the amount of salt you eat. Eat less than 1500 milligrams (mg) of salt per day or as recommended by your caregiver.   Weight monitoring. Weigh yourself every day. You should weigh yourself in the morning after you urinate and before you eat breakfast. Wear the same amount of clothing each time you weigh yourself. Record your weight daily. Bring your recorded weights to your clinic visits. Tell your caregiver right away if   you have gained 3 lb/1.4 kg in 1 day, or 5 lb/2.3 kg in a week or whatever amount you were told to report.   Blood pressure monitoring. This should be done as directed by your caregiver. A home blood pressure cuff can be purchased at a drugstore. Record your blood pressure numbers and bring them to your clinic visits. Tell your caregiver if you become dizzy or lightheaded upon standing up.   Smoking. If you are currently a smoker, it is time to quit. Nicotine makes your heart work harder by causing your blood vessels to constrict. Do not use nicotine gum or patches before talking to your caregiver.   Follow up. Be sure to schedule a follow-up visit with your caregiver. Keep all your appointments.  SEEK MEDICAL CARE IF:   Your weight increases by 3 lb/1.4 kg in 1 day or 5 lb/2.3 kg in a week.   You notice increasing shortness of breath that is unusual for you. This may happen during rest, sleep, or with activity.   You cough more than normal,  especially with physical activity.   You notice more swelling in your hands, feet, ankles, or belly (abdomen).   You are unable to sleep because it is hard to breathe.   You cough up bloody mucus (sputum).   You begin to feel "jumping" or "fluttering" sensations (palpitations) in your chest.  SEEK IMMEDIATE MEDICAL CARE IF:   You have severe chest pain or pressure which may include symptoms such as:   Pain or pressure in the arms, neck, jaw, or back.   Feeling sweaty.   Feeling sick to your stomach (nauseous).   Feeling short of breath while at rest.   Having a fast or irregular heartbeat.   You experience stroke symptoms. These symptoms include:   Facial weakness or numbness.   Weakness or numbness in an arm, leg, or on one side of your body.   Blurred vision.   Difficulty talking or thinking.   Dizziness or fainting.   Severe headache.  THESE ARE MEDICAL EMERGENCIES. Do not wait to see if the symptoms go away. Call your local emergency services (911 in [place] DO NOT drive yourself to the hospital. IMPORTANT  Make a list of every medicine, vitamin, or herbal supplement you are taking. Keep the list with you at all times. Show it to your caregiver at every visit. Keep the list up-to-date.   Ask your caregiver or pharmacist to write an explanation of each medicine you are taking. This should include:   Why you are taking it.   The possible side effects.   The best time of day to take it.   Foods to take with it or what foods to avoid.   When to stop taking it.  MAKE SURE YOU:   Understand these instructions.   Will watch your condition.   Will get help right away if you are not doing well or get worse.  Document Released: 05/25/2005 Document Revised: 02/04/2011 Document Reviewed: 09/06/2009 [facility] Patient Information 2012 [place], [place].Heart Failure Heart failure (HF) is a condition in which the heart has trouble pumping blood. This means your heart  does not pump blood efficiently for your body to work well. In some cases of HF, fluid may back up into your lungs or you may have swelling (edema) in your lower legs. HF is a long-term (chronic) condition. It is important for you to take good care of yourself and follow your caregiver's treatment plan. CAUSES  Health conditions:   High blood pressure (hypertension) causes the heart muscle to work harder than normal. When pressure in the blood vessels is high, the heart needs to pump (contract) with more force in order to circulate blood throughout the body. High blood pressure eventually causes the heart to become stiff and weak.   Coronary artery disease (CAD) is the buildup of cholesterol and fat (plaques) in the arteries of the heart. The blockage in the arteries deprives the heart muscle of oxygen and blood. This can cause chest pain and may lead to a heart attack. High blood pressure can also contribute to CAD.   Heart attack (myocardial infarction) occurs when 1 or more arteries in the heart become blocked. The loss of oxygen damages the muscle tissue of the heart. When this happens, part of the heart muscle dies. The injured tissue does not contract as well and weakens the heart's ability to pump blood.   Abnormal heart valves can cause HF when the heart valves do not open and close properly. This makes the heart muscle pump harder to keep the blood flowing.   Heart muscle disease (cardiomyopathy or myocarditis) is damage to the heart muscle from a variety of causes. These can include drug or alcohol abuse, infections, or unknown reasons. These can increase the risk of HF.   Lung disease makes the heart work harder because the lungs do not work properly. This can cause a strain on the heart leading it to fail.   Diabetes increases the risk of HF. High blood sugar contributes to high fat (lipid) levels in the blood. Diabetes can also cause slow damage to tiny blood vessels that carry  important nutrients to the heart muscle. When the heart does not get enough oxygen and food, it can cause the heart to become weak and stiff. This leads to a heart that does not contract efficiently.   Other diseases can contribute to HF. These include abnormal heart rhythms, thyroid problems, and low blood counts (anemia).   Unhealthy lifestyle habits:   Obesity.   Smoking.   Eating foods high in fat and cholesterol.   Eating or drinking beverages high in salt.   Drug or alcohol abuse.   Lack of exercise.  SYMPTOMS  HF symptoms may vary and can be hard to detect. Symptoms may include:  Shortness of breath with activity, such as climbing stairs.   Persistent cough.   Swelling of the feet, ankles, legs, or abdomen.   Unexplained weight gain.   Difficulty breathing when lying flat.   Waking from sleep because of the need to sit up and get more air.   Rapid heartbeat.   Fatigue and loss of energy.   Feeling lightheaded or close to fainting.  DIAGNOSIS  A diagnosis of HF is based on your history, symptoms, physical examination, and diagnostic tests. Diagnostic tests for HF may include:  EKG.   Chest X-ray.   Blood tests.   Exercise stress test.   Blood oxygen test (arterial blood gas).   Evaluation by a heart doctor (cardiologist).   Ultrasound evaluation of the heart (echocardiogram).   Heart artery test to look for blockages (angiogram).   Radioactive imaging to look at the heart (radionuclide test).  TREATMENT  Treatment is aimed at managing the symptoms of HF. Medicines, lifestyle changes, or surgical intervention may be necessary to treat HF.  Medicines to help treat HF may include:   Angiotensin-converting enzyme (ACE) inhibitors. These block the effects of a blood  protein called angiotensin-converting enzyme. ACE inhibitors relax (dilate) the blood vessels and help lower blood pressure. This decreases the workload of the heart, slows the progression  of HF, and improves symptoms.   Angiotensin receptor blockers (ARBs). These medications work similar to ACE inhibitors. ARBs may be an alternative for people who cannot tolerate an ACE inhibitor.   Aldosterone antagonists. This medication helps get rid of extra fluid from your body. This lowers the volume of blood the heart has to pump.   Water pills (diuretics). Diuretics cause the kidneys to remove salt and water from the blood. The extra fluid is removed by urination. By removing extra fluid from the body, diuretics help lower the workload of the heart and help prevent fluid buildup in the lungs so breathing is easier.   Beta blockers. These prevent the heart from beating too fast and improve heart muscle strength. Beta blockers help maintain a normal heart rate, control blood pressure, and improve HF symptoms.   Digitalis. This increases the force of the heartbeat and may be helpful to people with HF or heart rhythm problems.   Healthy lifestyle changes include:   Stopping smoking.   Eating a healthy diet. Avoid foods high in fat. Avoid foods fried in oil or made with fat. A dietician can help with healthy food choices.   Limiting how much salt you eat.   Limiting alcohol intake to no more than 1 drink per day for women and 2 drinks per day for men. Drinking more than that is harmful to your heart. If your heart has already been damaged by alcohol or you have severe HF, drinking alcohol should be stopped completely.   Exercising as directed by your caregiver.   Surgical treatment for HF may include:   Procedures to open blocked arteries, repair damaged heart valves, or remove damaged heart muscle tissue.   A pacemaker to help heart muscle function and to control certain abnormal heart rhythms.   A defibrillator to possibly prevent sudden cardiac death.  HOME CARE INSTRUCTIONS   Activity level. Your caregiver can help you determine what type of exercise program may be helpful. It  is important to maintain your strength. Pace your physical activity to avoid shortness of breath or chest pain. Rest for 1 hour before and after meals. A cardiac rehabilitation program may be helpful to some people with HF.   Diet. Eat a heart healthy diet. Food choices should be low in saturated fat and cholesterol. Talk to a dietician to learn about heart healthy foods.   Salt intake. When you have HF, you need to limit the amount of salt you eat. Eat less than 1500 milligrams (mg) of salt per day or as recommended by your caregiver.   Weight monitoring. Weigh yourself every day. You should weigh yourself in the morning after you urinate and before you eat breakfast. Wear the same amount of clothing each time you weigh yourself. Record your weight daily. Bring your recorded weights to your clinic visits. Tell your caregiver right away if you have gained 3 lb/1.4 kg in 1 day, or 5 lb/2.3 kg in a week or whatever amount you were told to report.   Blood pressure monitoring. This should be done as directed by your caregiver. A home blood pressure cuff can be purchased at a drugstore. Record your blood pressure numbers and bring them to your clinic visits. Tell your caregiver if you become dizzy or lightheaded upon standing up.   Smoking. If you are  currently a smoker, it is time to quit. Nicotine makes your heart work harder by causing your blood vessels to constrict. Do not use nicotine gum or patches before talking to your caregiver.   Follow up. Be sure to schedule a follow-up visit with your caregiver. Keep all your appointments.  SEEK MEDICAL CARE IF:   Your weight increases by 3 lb/1.4 kg in 1 day or 5 lb/2.3 kg in a week.   You notice increasing shortness of breath that is unusual for you. This may happen during rest, sleep, or with activity.   You cough more than normal, especially with physical activity.   You notice more swelling in your hands, feet, ankles, or belly (abdomen).   You  are unable to sleep because it is hard to breathe.   You cough up bloody mucus (sputum).   You begin to feel "jumping" or "fluttering" sensations (palpitations) in your chest.  SEEK IMMEDIATE MEDICAL CARE IF:   You have severe chest pain or pressure which may include symptoms such as:   Pain or pressure in the arms, neck, jaw, or back.   Feeling sweaty.   Feeling sick to your stomach (nauseous).   Feeling short of breath while at rest.   Having a fast or irregular heartbeat.   You experience stroke symptoms. These symptoms include:   Facial weakness or numbness.   Weakness or numbness in an arm, leg, or on one side of your body.   Blurred vision.   Difficulty talking or thinking.   Dizziness or fainting.   Severe headache.  THESE ARE MEDICAL EMERGENCIES. Do not wait to see if the symptoms go away. Call your local emergency services (911 in [place] DO NOT drive yourself to the hospital. IMPORTANT  Make a list of every medicine, vitamin, or herbal supplement you are taking. Keep the list with you at all times. Show it to your caregiver at every visit. Keep the list up-to-date.   Ask your caregiver or pharmacist to write an explanation of each medicine you are taking. This should include:   Why you are taking it.   The possible side effects.   The best time of day to take it.   Foods to take with it or what foods to avoid.   When to stop taking it.  MAKE SURE YOU:   Understand these instructions.   Will watch your condition.   Will get help right away if you are not doing well or get worse.  Document Released: 05/25/2005 Document Revised: 02/04/2011 Document Reviewed: 09/06/2009 [facility] Patient Information 2012 [place], [place].

## 2011-08-07 NOTE — Progress Notes (Signed)
Chaplain finally connected with patient, but patient was preparing to leave the hospital. Chaplain not needed.

## 2011-08-07 NOTE — Progress Notes (Signed)
Nursing Note: Pt to be discharged per MD's order. Pt has received all discharged information and verbalizes understanding. IV and cardiac monitor discontinued per MD's order. Will escort pt to car safely . Meital Riehl Scientist, clinical (histocompatibility and immunogenetics).

## 2011-08-07 NOTE — Discharge Summary (Signed)
  Discharge summary dictated on 08/07/2011 dictation number is [phone]

## 2011-08-07 NOTE — Progress Notes (Signed)
Chaplain attempted another visit with patient, but patient was asleep. Chaplain plans to try again later.

## 2011-08-07 NOTE — Discharge Summary (Signed)
[name], REASON             ACCOUNT NO.:  [id number]  MEDICAL RECORD NO.:  [id number]  LOCATION:  4705                         FACILITY:  [facility]  PHYSICIAN:  Eduardo Osier. Sharyn Lull, M.D. DATE OF BIRTH:  [birth year]  DATE OF ADMISSION:  08/03/2011 DATE OF DISCHARGE:  08/07/2011                              DISCHARGE SUMMARY   ADMITTING DIAGNOSES:  Decompensated acute systolic heart failure, hypertensive heart disease with systolic dysfunction, non-insulin- dependent diabetes mellitus, hypercholesteremia, morbid obesity, status post atypical chest pain.  FINAL DIAGNOSES:  Compensated systolic heart failure, hypertensive heart disease with systolic dysfunction, non-insulin-dependent diabetes mellitus, hypercholesteremia, morbid obesity, status post atypical chest pain.  DISCHARGE HOME MEDICATIONS: 1. Enteric-coated aspirin 81 mg 1 tablet daily. 2. Carvedilol 25 mg 1 tablet twice daily. 3. Lasix 80 mg 1 tablet daily. 4. Potassium chloride 20 mEq 1 tablet daily. 5. Ramipril 10 mg 1 capsule twice daily. 6. Spironolactone 25 mg 1 tablet twice daily. 7. Metformin 1000 mg daily as before. 8. Januvia 100 mg daily as before. 9. The patient has been advised to stop ibuprofen and Benicar.  DIET:  Low salt, low cholesterol 1800 calories ADA diet.  The patient has been advised to monitor blood pressure and blood sugar daily.  Heart failure instructions have been given.  Follow up with me in 1 week.  CONDITION AT DISCHARGE:  Stable.  BRIEF HISTORY AND HOSPITAL COURSE:  Mr. [name] is 66 year old black male with past medical history significant for hypertension, insulin- requiring diabetes mellitus, hypercholesteremia, morbid obesity.  He came to the ER complaining of progressive increasing shortness of breath associated with leg swelling, cough for last 1 week, associated with generalized weakness.  The patient also complains of retrosternal and epigastric pain off and on without  associated symptoms.  The patient denies any history of exertional chest pain but does give history of exertional dyspnea.  The patient was noted to be in decompensated systolic heart failure.  Two D echo done today showed EF of 25-30%.  The patient denies any noninvasive or invasive cardiac workup in the past. The patient states his blood pressure has always been elevated and has problem controlling it.  The patient was seen in Cardiology consultation regarding further management for heart failure and hypertensive urgency. The patient was transferred to my service the next day.  PAST MEDICAL HISTORY:  As above.  PAST SURGICAL HISTORY:  He had right leg fracture in the past, had also skin grafting of the left arm in the past.  SOCIAL HISTORY:  No history of smoking or alcohol abuse.  ALLERGIES:  No known drug allergies.  PHYSICAL EXAMINATION:  GENERAL:  He was alert, awake, oriented x3. VITAL SIGNS:  Blood pressure was 174/93, pulse was 88.  He was afebrile. EYES:  Conjunctivae were pink. NECK:  Supple.  Positive JVD. LUNGS:  Decreased breath sounds at bases with bibasilar rales. CARDIOVASCULAR:  S1 and S2 was normal.  There was soft systolic murmur and S3 gallop. ABDOMEN:  Soft.  Bowel sounds are present.  Mildly distended. EXTREMITIES:  There is no clubbing, cyanosis.  There was 2+ edema.  LABORATORY DATA:  His sodium was 148, potassium 3.7, BUN 14, creatinine 1.12.  His BNP was 8213.  Three sets of troponin I were negative.  CK was 110, MB 5.7, second set CK 113, MB 5.9, third set CK 108, MB 5.1. Hemoglobin was 14.8, hematocrit 44.6, white count of 10.6.  Today, hemoglobin is 11.6, hematocrit 37.1, white count of 7.6.  His blood sugar has been in 110-180.  Today, his sodium is 148, potassium 3.6, BUN 15, creatinine 1.14.  Fasting sugar is 111.  Repeat chest x-ray from this morning is still pending.  A 2D echo showed moderate concentric LVH.  Systolic function was severely  reduced with EF of 25-30%.  EKG showed sinus tachycardia with nonspecific ST-T wave changes.  BRIEF HOSPITAL COURSE:  The patient was admitted to telemetry unit.  MI was ruled out by serial enzymes and EKG.  The patient was started on IV Lasix with good diuresis and subsequently ACE inhibitors, beta-blockers, and spironolactone was added with good diuresis.  The patient's leg swelling is completely resolved.  His breathing has improved.  The patient is ambulating in hallway without any problems.  The patient will be discharged home on above medications.  Will be followed up in my office in 1 week.  We will repeat the echo in few months to evaluate the LV systolic function.  If his EF remains below 35%, we will refer him to EP for possible ICD.     Eduardo Osier. Sharyn Lull, M.D.     [facility]  D:  08/07/2011  T:  08/07/2011  Job:  [id number]

## 2012-02-10 ENCOUNTER — Emergency Department (HOSPITAL_COMMUNITY): Payer: Medicare Other

## 2012-02-10 ENCOUNTER — Emergency Department (HOSPITAL_COMMUNITY)
Admission: EM | Admit: 2012-02-10 | Discharge: 2012-02-11 | Disposition: A | Discharge status: Home / Self Care | Payer: Medicare Other | Attending: Emergency Medicine | Admitting: Emergency Medicine

## 2012-02-10 ENCOUNTER — Encounter (HOSPITAL_COMMUNITY): Payer: Self-pay | Admitting: Emergency Medicine

## 2012-02-10 DIAGNOSIS — T7840XA Allergy, unspecified, initial encounter: Secondary | ICD-10-CM | POA: Insufficient documentation

## 2012-02-10 DIAGNOSIS — I10 Essential (primary) hypertension: Secondary | ICD-10-CM | POA: Insufficient documentation

## 2012-02-10 DIAGNOSIS — R609 Edema, unspecified: Secondary | ICD-10-CM | POA: Insufficient documentation

## 2012-02-10 DIAGNOSIS — I509 Heart failure, unspecified: Secondary | ICD-10-CM | POA: Insufficient documentation

## 2012-02-10 DIAGNOSIS — L989 Disorder of the skin and subcutaneous tissue, unspecified: Secondary | ICD-10-CM | POA: Insufficient documentation

## 2012-02-10 DIAGNOSIS — E119 Type 2 diabetes mellitus without complications: Secondary | ICD-10-CM | POA: Insufficient documentation

## 2012-02-10 DIAGNOSIS — Z7982 Long term (current) use of aspirin: Secondary | ICD-10-CM | POA: Insufficient documentation

## 2012-02-10 DIAGNOSIS — R234 Changes in skin texture: Secondary | ICD-10-CM

## 2012-02-10 DIAGNOSIS — X58XXXA Exposure to other specified factors, initial encounter: Secondary | ICD-10-CM | POA: Insufficient documentation

## 2012-02-10 HISTORY — DX: Heart failure, unspecified: I50.9

## 2012-02-10 LAB — BASIC METABOLIC PANEL
BUN: 17 mg/dL (ref 6–23)
CO2: 28 mEq/L (ref 19–32)
Calcium: 9 mg/dL (ref 8.4–10.5)
Chloride: 107 mEq/L (ref 96–112)
Creatinine, Ser: 1.39 mg/dL — ABNORMAL HIGH (ref 0.50–1.35)
Glucose, Bld: 194 mg/dL — ABNORMAL HIGH (ref 70–99)

## 2012-02-10 LAB — CBC WITH DIFFERENTIAL/PLATELET
Basophils Absolute: 0.1 10*3/uL (ref 0.0–0.1)
Eosinophils Relative: 16 % — ABNORMAL HIGH (ref 0–5)
HCT: 40.7 % (ref 39.0–52.0)
Hemoglobin: 13.6 g/dL (ref 13.0–17.0)
Lymphocytes Relative: 9 % — ABNORMAL LOW (ref 12–46)
Lymphs Abs: 1 10*3/uL (ref 0.7–4.0)
MCV: 80.6 fL (ref 78.0–100.0)
Monocytes Absolute: 0.6 10*3/uL (ref 0.1–1.0)
Monocytes Relative: 6 % (ref 3–12)
Neutro Abs: 7.3 10*3/uL (ref 1.7–7.7)
RDW: 14.7 % (ref 11.5–15.5)
WBC: 10.7 10*3/uL — ABNORMAL HIGH (ref 4.0–10.5)

## 2012-02-10 LAB — GLUCOSE, CAPILLARY: Glucose-Capillary: 176 mg/dL — ABNORMAL HIGH (ref 70–99)

## 2012-02-10 MED ORDER — FAMOTIDINE 20 MG PO TABS
20.0000 mg | ORAL_TABLET | Freq: Once | ORAL | Status: AC
  Administered 2012-02-10: 20 mg via ORAL
  Filled 2012-02-10: qty 1

## 2012-02-10 MED ORDER — PREDNISONE 20 MG PO TABS: 40.0000 mg | ORAL_TABLET | Freq: Every day | ORAL | Status: AC

## 2012-02-10 MED ORDER — PREDNISONE 20 MG PO TABS
40.0000 mg | ORAL_TABLET | Freq: Once | ORAL | Status: AC
  Administered 2012-02-10: 40 mg via ORAL
  Filled 2012-02-10: qty 2

## 2012-02-10 MED ORDER — FUROSEMIDE 10 MG/ML IJ SOLN
80.0000 mg | Freq: Once | INTRAMUSCULAR | Status: AC
  Administered 2012-02-10: 80 mg via INTRAVENOUS
  Filled 2012-02-10: qty 8

## 2012-02-10 MED ORDER — DIPHENHYDRAMINE HCL 25 MG PO CAPS
25.0000 mg | ORAL_CAPSULE | Freq: Once | ORAL | Status: AC
  Administered 2012-02-10: 25 mg via ORAL
  Filled 2012-02-10: qty 1

## 2012-02-10 MED ORDER — SODIUM CHLORIDE 0.9 % IV SOLN
Freq: Once | INTRAVENOUS | Status: AC
  Administered 2012-02-10: 23:00:00 via INTRAVENOUS

## 2012-02-10 NOTE — ED Notes (Addendum)
Pt reports that he noticed a rash to entire body about 2 days ago, also having blisters, swelling and drainage to R foot; reports that saw PCP today and was told that his rash was d/t a medication- but unsure of want medication; also reports having drainage surgery to R foot since spring, and continues to have problems with swelling; pt has blisters to bottom of foot with serosanguinous drainage, also wound to bottom of foot below 2nd toe; CMS intact; pt denies difficulty breathing, pt reports that rash is itching

## 2012-02-10 NOTE — ED Notes (Signed)
Assumed care of pt.  Pt in radiology.  Introduced myself to family member at bedside.

## 2012-02-10 NOTE — ED Notes (Signed)
The patient's CBG was 176. 

## 2012-02-10 NOTE — ED Provider Notes (Signed)
History     CSN: [id number]  Arrival date & time 02/10/12  1514   First MD Initiated Contact with Patient 02/10/12 2045      Chief Complaint  Patient presents with  . Rash  . Wound Infection    (Consider location/radiation/quality/duration/timing/severity/associated sxs/prior treatment) HPI Comments: Patient comes in today with a few different complaints.  He was sent to the ED by his PCP Dr. Sharyn Lull.  He presents with a wound to the plantar aspect of his right foot.  He report that the wound has been there for several months.  It is currently being managed by Dr. Irving Shows with [facility].  He has not been putting anything on the wound.  He noticed blisters developing around the  wound over the past 2 days.  He has also had bilateral pitting edema, which began weeping earlier today.  He states that he is supposed to take Lasix 80 mg PO once daily, but he has only been taking his diuretic intermittently.  Last dose was yesterday.  Wife reports that the patient is not compliant with his medications.  Patient has also been non compliant with his antihypertensive medications.  He denies any chest pain or SOB.  He denies Orthopnea or PND.  He is also complaining of a rash on his back, chest, both legs, and both arms.  Rash has been present since yesterday.  The rash is not painful but does itch.  He states that Dr. Sharyn Lull told him that it was an allergic reaction.  He has not taken anything for the rash.  He denies new medications, soaps, detergents, or lotions.    The history is provided by the patient and the spouse.    Past Medical History  Diagnosis Date  . Diabetes mellitus   . Hypertension   . Hyperlipidemia   . CHF (congestive heart failure)   . Enlarged heart     Past Surgical History  Procedure Date  . Right leg fracture   . Skin graft   . Knee surgery     No family history on file.  History  Substance Use Topics  . Smoking status: Never Smoker   . Smokeless  tobacco: Not on file  . Alcohol Use: No      Review of Systems  Constitutional: Negative for fever and chills.  Respiratory: Negative for cough and shortness of breath.   Cardiovascular: Positive for leg swelling. Negative for chest pain.  Skin: Positive for wound. Negative for color change.  Neurological: Positive for numbness.  All other systems reviewed and are negative.    Allergies  Review of patient's allergies indicates no known allergies.  Home Medications   Current Outpatient Rx  Name Route Sig Dispense Refill  . ASPIRIN 81 MG PO TBEC Oral Take 1 tablet (81 mg total) by mouth daily. 30 tablet 3  . CARVEDILOL 25 MG PO TABS Oral Take 1 tablet (25 mg total) by mouth 2 (two) times daily with a meal. 60 tablet 3  . FUROSEMIDE 80 MG PO TABS Oral Take 1 tablet (80 mg total) by mouth daily. 30 tablet 3  . METFORMIN HCL 500 MG PO TABS Oral Take 1,000 mg by mouth at bedtime.    Marland Kitchen POTASSIUM CHLORIDE CRYS ER 20 MEQ PO TBCR Oral Take 1 tablet (20 mEq total) by mouth daily. 30 tablet 3  . RAMIPRIL 10 MG PO CAPS Oral Take 1 capsule (10 mg total) by mouth 2 (two) times daily. 60 capsule  3  . SITAGLIPTIN PHOSPHATE 100 MG PO TABS Oral Take 100 mg by mouth daily.    Marland Kitchen SPIRONOLACTONE 25 MG PO TABS Oral Take 1 tablet (25 mg total) by mouth 2 (two) times daily. 60 tablet 3    BP 166/71  Pulse 95  Temp 98.6 F (37 C) (Oral)  Resp 15  SpO2 100%  Physical Exam  Nursing note and vitals reviewed. Constitutional: He appears well-developed and well-nourished.  HENT:  Head: Normocephalic and atraumatic.  Mouth/Throat: Oropharynx is clear and moist.  Cardiovascular: Normal rate, regular rhythm and normal heart sounds.   Pulmonary/Chest: Effort normal and breath sounds normal. No respiratory distress. He has no wheezes. He has no rales.  Musculoskeletal:       3+ pitting edema of lower extremities from the knee distally.  Both feet are actively weeping.    Neurological: He is alert.    Skin: Skin is warm and dry. No petechiae and no purpura noted.       3 cm necrotic foot ulcer on the plantar aspect of the right  Foot.  Patient able to wiggle all of his toes.  Erythematous, blanchable, papular rash located on his arms, back, both arms, and both legs.    Psychiatric: He has a normal mood and affect.    ED Course  Procedures (including critical care time)  Labs Reviewed  CBC WITH DIFFERENTIAL - Abnormal; Notable for the following:    WBC 10.7 (*)     Lymphocytes Relative 9 (*)     Eosinophils Relative 16 (*)     Eosinophils Absolute 1.8 (*)     All other components within normal limits  BASIC METABOLIC PANEL - Abnormal; Notable for the following:    Glucose, Bld 194 (*)     Creatinine, Ser 1.39 (*)     GFR calc non Af Amer 51 (*)     GFR calc Af Amer 59 (*)     All other components within normal limits  GLUCOSE, CAPILLARY - Abnormal; Notable for the following:    Glucose-Capillary 176 (*)     All other components within normal limits   Dg Chest 2 View  02/10/2012  *RADIOLOGY REPORT*  Clinical Data: Feet swelling, hypertension, diabetes, history CHF  CHEST - 2 VIEW  Comparison: 08/07/2011  Findings: Minimal enlargement of cardiac silhouette. Mediastinal contours and pulmonary vascularity normal. Decrease in right basilar atelectasis and right pleural effusion since previous study. Tiny bibasilar effusions present. Minimal peribronchial thickening. No acute failure or consolidation. Bones unremarkable.  IMPRESSION: Tiny bibasilar effusions and minimal right base atelectasis. Minimal enlargement of cardiac silhouette. Minimal bronchitic changes.   Original Report Authenticated By: Lollie Marrow, M.D.    Dg Foot Complete Right  02/10/2012  *RADIOLOGY REPORT*  Clinical Data: Diabetic with nonhealing forefoot wound.  RIGHT FOOT COMPLETE - 3+ VIEW  Comparison: None.  Findings: Soft tissue ulceration is noted over the plantar aspect of the distal metatarsals on the lateral  view.  No foreign body or bone destruction is seen.  There are moderate degenerative changes at the first metatarsal phalangeal joint.  There are small calcaneal spurs.  There is post-traumatic deformity of the distal tibia and fibula status post tibial intramedullary nail fixation. There is forefoot soft tissue swelling.  IMPRESSION: Forefoot soft tissue swelling with plantar skin ulceration.  No radiographic evidence of osteomyelitis.   Original Report Authenticated By: Gerrianne Scale, M.D.      No diagnosis found.  10:32 PM Discussed  with Dr Sharyn Lull.  He states that he was concerned about the patient's foot and sent him to the hospital to ensure that he did not have an Osteomyelitis.  He also reports that he feels that the patient can have 80 mg IV Lasix for CHF exacerbation and recommends having the patient double up on the dose at home.  He will follow up in the office with the patient on Monday.  MDM  Patient with wound to the plantar aspect of the foot.  Wound is managed by Dr. Irving Shows with [facility].  Patient is afebrile.  The foot does not appear to be infected at this time.  No surrounding erythema.  Wound is necrotic.  Xray does not show evidence of Osteomyelits.  Patient also has significant peripheral edema and is weeping.  Patient is currently on 80 mg Lasix PO daily.  Patient given 80 mg Lasix IV while in the ED and has been instructed to double up on the dose.  He denies SOB.  CXR stable.  Discussed with Dr. Sharyn Lull.  He reports that the patient can follow up with him in the office on Monday.        Pascal Lux Butler, PA-C 02/12/12 0306  Pascal Lux Emery, PA-C 02/12/12 [phone]

## 2012-02-10 NOTE — ED Notes (Signed)
Pt. Reports rash all over body for x3 days. Small, itchy bumps throughout skin. Also reports draining blisters on bilateral feet today. Pt. Diabetic.

## 2012-02-12 NOTE — ED Provider Notes (Signed)
Medical screening examination/treatment/procedure(s) were conducted as a shared visit with non-physician practitioner(s) and myself.  I personally evaluated the patient during the encounter [name], M.D.  [name] is a 65 y.o. male with a PMH of DM, HTN, CHF presenting on referral from his primary care physician Dr. Sharyn Lull  for evaluation of leg swelling. Patient's wife says he is allergic to shrimp and I week ago went to [place] and had a large shrimp dinner. Shortly thereafter the patient began experiencing raised rash was very itchy over his body which has persisted and is currently present. Moderate, and has not gone away, and the constant, not worsening, not associated with any difficulty breathing or trouble swallowing.  Patient also has chronically swollen lower extremities due to his congestive heart failure, this also had a right foot nonhealing ulcer which is being followed by podiatry at the [facility].    Past Medical History  Diagnosis Date  . Diabetes mellitus   . Hypertension   . Hyperlipidemia   . CHF (congestive heart failure)   . Enlarged heart      VITAL SIGNS:   Filed Vitals:   02/11/12 0007  BP: 185/94  Pulse: 111  Temp:   Resp: 24   MDM: Patient does have some lower extreme edema which is chronic, his foot ulcer is also chronic and appears to be healing-does not appear to be infected. X-rays showed there is no associated osteomyelitis detectable. Patient's most concerned about the weeping in the lower aspects of his legs. Says likely due to the old over body maculopapular rash he has which is exacerbated at the sock line we can see rings around his ankles they're slightly more erythematous with some denser rash.  Patient does have an increase in his eosinophils and a slight bump in his creatinine, she'll consistent with an allergic reaction. 2 the patient with steroids and some Benadryl for his allergic reaction and refer him back to Dr.  Sharyn Lull for followup for this allergic reaction. Patient to followup with [facility] for his foot ulcer who has been managing it thus far. I do not think the patient needs to be admitted. Do not see any signs of systemic infection or worsening foot infection.   Justin Skene, MD 02/12/12 [phone]

## 2012-02-12 NOTE — ED Provider Notes (Signed)
Medical screening examination/treatment/procedure(s) were performed by non-physician practitioner and as supervising physician I was immediately available for consultation/collaboration.  Jones Skene, M.D.     Jones Skene, MD 02/12/12 2159

## 2012-02-19 ENCOUNTER — Emergency Department (HOSPITAL_COMMUNITY): Payer: Medicare Other

## 2012-02-19 ENCOUNTER — Encounter (HOSPITAL_COMMUNITY): Payer: Self-pay | Admitting: Emergency Medicine

## 2012-02-19 ENCOUNTER — Inpatient Hospital Stay (HOSPITAL_COMMUNITY)
Admission: EM | Admit: 2012-02-19 | Discharge: 2012-02-24 | DRG: 291 | Disposition: A | Discharge status: Home / Self Care | Payer: Medicare Other | Attending: Cardiology | Admitting: Cardiology

## 2012-02-19 DIAGNOSIS — E785 Hyperlipidemia, unspecified: Secondary | ICD-10-CM | POA: Diagnosis present

## 2012-02-19 DIAGNOSIS — I509 Heart failure, unspecified: Secondary | ICD-10-CM | POA: Diagnosis present

## 2012-02-19 DIAGNOSIS — Z6833 Body mass index (BMI) 33.0-33.9, adult: Secondary | ICD-10-CM

## 2012-02-19 DIAGNOSIS — L259 Unspecified contact dermatitis, unspecified cause: Secondary | ICD-10-CM | POA: Diagnosis present

## 2012-02-19 DIAGNOSIS — I428 Other cardiomyopathies: Secondary | ICD-10-CM | POA: Diagnosis present

## 2012-02-19 DIAGNOSIS — E1169 Type 2 diabetes mellitus with other specified complication: Secondary | ICD-10-CM | POA: Diagnosis present

## 2012-02-19 DIAGNOSIS — N179 Acute kidney failure, unspecified: Secondary | ICD-10-CM | POA: Diagnosis present

## 2012-02-19 DIAGNOSIS — I5023 Acute on chronic systolic (congestive) heart failure: Secondary | ICD-10-CM | POA: Diagnosis present

## 2012-02-19 DIAGNOSIS — Z79899 Other long term (current) drug therapy: Secondary | ICD-10-CM

## 2012-02-19 DIAGNOSIS — N182 Chronic kidney disease, stage 2 (mild): Secondary | ICD-10-CM | POA: Diagnosis present

## 2012-02-19 DIAGNOSIS — E7800 Pure hypercholesterolemia, unspecified: Secondary | ICD-10-CM | POA: Diagnosis present

## 2012-02-19 DIAGNOSIS — I130 Hypertensive heart and chronic kidney disease with heart failure and stage 1 through stage 4 chronic kidney disease, or unspecified chronic kidney disease: Principal | ICD-10-CM | POA: Diagnosis present

## 2012-02-19 DIAGNOSIS — L97509 Non-pressure chronic ulcer of other part of unspecified foot with unspecified severity: Secondary | ICD-10-CM | POA: Diagnosis present

## 2012-02-19 DIAGNOSIS — Z7982 Long term (current) use of aspirin: Secondary | ICD-10-CM

## 2012-02-19 LAB — URINALYSIS, ROUTINE W REFLEX MICROSCOPIC
Bilirubin Urine: NEGATIVE
Glucose, UA: 250 mg/dL — AB
Specific Gravity, Urine: 1.019 (ref 1.005–1.030)
Urobilinogen, UA: 2 mg/dL — ABNORMAL HIGH (ref 0.0–1.0)
pH: 6.5 (ref 5.0–8.0)

## 2012-02-19 LAB — GLUCOSE, CAPILLARY
Glucose-Capillary: 252 mg/dL — ABNORMAL HIGH (ref 70–99)
Glucose-Capillary: 256 mg/dL — ABNORMAL HIGH (ref 70–99)

## 2012-02-19 LAB — COMPREHENSIVE METABOLIC PANEL
ALT: 8 U/L (ref 0–53)
ALT: 7 U/L (ref 0–53)
AST: 13 U/L (ref 0–37)
AST: 12 U/L (ref 0–37)
Albumin: 3.3 g/dL — ABNORMAL LOW (ref 3.5–5.2)
Albumin: 3.2 g/dL — ABNORMAL LOW (ref 3.5–5.2)
Alkaline Phosphatase: 79 U/L (ref 39–117)
Alkaline Phosphatase: 74 U/L (ref 39–117)
Calcium: 9.3 mg/dL (ref 8.4–10.5)
Chloride: 97 mEq/L (ref 96–112)
GFR calc Af Amer: 81 mL/min — ABNORMAL LOW (ref >90)
Potassium: 3.8 mEq/L (ref 3.5–5.1)
Potassium: 3.6 mEq/L (ref 3.5–5.1)
Sodium: 138 mEq/L (ref 135–145)
Sodium: 137 mEq/L (ref 135–145)
Total Bilirubin: 0.6 mg/dL (ref 0.3–1.2)
Total Protein: 7.2 g/dL (ref 6.0–8.3)
Total Protein: 6.7 g/dL (ref 6.0–8.3)

## 2012-02-19 LAB — CBC WITH DIFFERENTIAL/PLATELET
Basophils Absolute: 0.1 10*3/uL (ref 0.0–0.1)
Basophils Relative: 1 % (ref 0–1)
Basophils Relative: 1 % (ref 0–1)
Eosinophils Absolute: 2.2 10*3/uL — ABNORMAL HIGH (ref 0.0–0.7)
HCT: 37.7 % — ABNORMAL LOW (ref 39.0–52.0)
Hemoglobin: 12.7 g/dL — ABNORMAL LOW (ref 13.0–17.0)
Lymphocytes Relative: 6 % — ABNORMAL LOW (ref 12–46)
MCH: 26.3 pg (ref 26.0–34.0)
MCHC: 33.7 g/dL (ref 30.0–36.0)
MCHC: 33.1 g/dL (ref 30.0–36.0)
Monocytes Absolute: 0.8 10*3/uL (ref 0.1–1.0)
Neutro Abs: 9.2 10*3/uL — ABNORMAL HIGH (ref 1.7–7.7)
Neutro Abs: 9.1 10*3/uL — ABNORMAL HIGH (ref 1.7–7.7)
Neutrophils Relative %: 70 % (ref 43–77)
Neutrophils Relative %: 69 % (ref 43–77)
Platelets: 192 10*3/uL (ref 150–400)
RBC: 4.6 MIL/uL (ref 4.22–5.81)
RDW: 14.5 % (ref 11.5–15.5)
WBC: 13.1 10*3/uL — ABNORMAL HIGH (ref 4.0–10.5)

## 2012-02-19 LAB — TROPONIN I: Troponin I: <0.3 ng/mL (ref <0.30)

## 2012-02-19 LAB — URINE MICROSCOPIC-ADD ON

## 2012-02-19 LAB — LIPASE, BLOOD
Lipase: 14 U/L (ref 11–59)
Lipase: 19 U/L (ref 11–59)

## 2012-02-19 LAB — PROTIME-INR: INR: 1.06 (ref 0.00–1.49)

## 2012-02-19 LAB — PRO B NATRIURETIC PEPTIDE: Pro B Natriuretic peptide (BNP): 15285 pg/mL — ABNORMAL HIGH (ref 0–125)

## 2012-02-19 LAB — MAGNESIUM: Magnesium: 2.1 mg/dL (ref 1.5–2.5)

## 2012-02-19 MED ORDER — ONDANSETRON HCL 4 MG/2ML IJ SOLN
4.0000 mg | Freq: Once | INTRAMUSCULAR | Status: AC
  Administered 2012-02-19: 4 mg via INTRAVENOUS
  Filled 2012-02-19: qty 2

## 2012-02-19 MED ORDER — HEPARIN SODIUM (PORCINE) 5000 UNIT/ML IJ SOLN
5000.0000 [IU] | Freq: Three times a day (TID) | INTRAMUSCULAR | Status: DC
  Administered 2012-02-19 – 2012-02-24 (×14): 5000 [IU] via SUBCUTANEOUS
  Filled 2012-02-19 (×20): qty 1

## 2012-02-19 MED ORDER — ACETAMINOPHEN 325 MG PO TABS: 650.0000 mg | ORAL_TABLET | ORAL | Status: DC | PRN

## 2012-02-19 MED ORDER — DIPHENHYDRAMINE HCL 25 MG PO CAPS
25.0000 mg | ORAL_CAPSULE | Freq: Three times a day (TID) | ORAL | Status: DC | PRN
  Administered 2012-02-19 – 2012-02-21 (×4): 25 mg via ORAL
  Filled 2012-02-19 (×4): qty 1

## 2012-02-19 MED ORDER — POTASSIUM CHLORIDE CRYS ER 20 MEQ PO TBCR
20.0000 meq | EXTENDED_RELEASE_TABLET | Freq: Two times a day (BID) | ORAL | Status: DC
  Administered 2012-02-19 – 2012-02-21 (×5): 20 meq via ORAL
  Filled 2012-02-19 (×6): qty 1

## 2012-02-19 MED ORDER — FUROSEMIDE 10 MG/ML IJ SOLN
80.0000 mg | Freq: Two times a day (BID) | INTRAMUSCULAR | Status: DC
  Administered 2012-02-19 – 2012-02-21 (×4): 80 mg via INTRAVENOUS
  Filled 2012-02-19 (×7): qty 8

## 2012-02-19 MED ORDER — SPIRONOLACTONE 25 MG PO TABS
25.0000 mg | ORAL_TABLET | Freq: Two times a day (BID) | ORAL | Status: DC
  Administered 2012-02-19 – 2012-02-24 (×10): 25 mg via ORAL
  Filled 2012-02-19 (×13): qty 1

## 2012-02-19 MED ORDER — LABETALOL HCL 5 MG/ML IV SOLN: 20.0000 mg | Freq: Once | INTRAVENOUS | Status: DC

## 2012-02-19 MED ORDER — ASPIRIN EC 81 MG PO TBEC
81.0000 mg | DELAYED_RELEASE_TABLET | Freq: Every day | ORAL | Status: DC
  Administered 2012-02-19 – 2012-02-24 (×6): 81 mg via ORAL
  Filled 2012-02-19 (×6): qty 1

## 2012-02-19 MED ORDER — SODIUM CHLORIDE 0.9 % IV SOLN
INTRAVENOUS | Status: DC
  Administered 2012-02-19: 10 mL/h via INTRAVENOUS

## 2012-02-19 MED ORDER — SODIUM CHLORIDE 0.9 % IJ SOLN: 3.0000 mL | INTRAMUSCULAR | Status: DC | PRN

## 2012-02-19 MED ORDER — SODIUM CHLORIDE 0.9 % IV SOLN
Freq: Once | INTRAVENOUS | Status: AC
  Administered 2012-02-19: 10:00:00 via INTRAVENOUS

## 2012-02-19 MED ORDER — METOLAZONE 5 MG PO TABS
5.0000 mg | ORAL_TABLET | Freq: Every day | ORAL | Status: DC
  Administered 2012-02-19 – 2012-02-21 (×3): 5 mg via ORAL
  Filled 2012-02-19 (×3): qty 1

## 2012-02-19 MED ORDER — ISOSORB DINITRATE-HYDRALAZINE 20-37.5 MG PO TABS
1.0000 | ORAL_TABLET | Freq: Three times a day (TID) | ORAL | Status: DC
  Administered 2012-02-19 – 2012-02-23 (×11): 1 via ORAL
  Filled 2012-02-19 (×16): qty 1

## 2012-02-19 MED ORDER — RAMIPRIL 10 MG PO CAPS
10.0000 mg | ORAL_CAPSULE | Freq: Two times a day (BID) | ORAL | Status: DC
  Administered 2012-02-19 – 2012-02-21 (×5): 10 mg via ORAL
  Filled 2012-02-19 (×6): qty 1

## 2012-02-19 MED ORDER — CARVEDILOL 25 MG PO TABS
25.0000 mg | ORAL_TABLET | Freq: Two times a day (BID) | ORAL | Status: DC
  Administered 2012-02-19 – 2012-02-24 (×10): 25 mg via ORAL
  Filled 2012-02-19 (×13): qty 1

## 2012-02-19 MED ORDER — SODIUM CHLORIDE 0.9 % IV SOLN: 250.0000 mL | INTRAVENOUS | Status: DC | PRN

## 2012-02-19 MED ORDER — NITROGLYCERIN IN D5W 200-5 MCG/ML-% IV SOLN
2.0000 ug/min | Freq: Once | INTRAVENOUS | Status: AC
  Administered 2012-02-19: 10 ug/min via INTRAVENOUS
  Filled 2012-02-19: qty 250

## 2012-02-19 MED ORDER — NITROGLYCERIN IN D5W 200-5 MCG/ML-% IV SOLN: 2.0000 ug/min | INTRAVENOUS | Status: DC

## 2012-02-19 MED ORDER — ASPIRIN 81 MG PO CHEW
324.0000 mg | CHEWABLE_TABLET | Freq: Once | ORAL | Status: AC
  Administered 2012-02-19: 324 mg via ORAL
  Filled 2012-02-19: qty 4

## 2012-02-19 MED ORDER — NITROGLYCERIN IN D5W 200-5 MCG/ML-% IV SOLN: 10.0000 ug/min | INTRAVENOUS | Status: DC

## 2012-02-19 MED ORDER — HYDRALAZINE HCL 20 MG/ML IJ SOLN
10.0000 mg | Freq: Once | INTRAMUSCULAR | Status: AC
  Administered 2012-02-19: 10 mg via INTRAVENOUS
  Filled 2012-02-19: qty 1

## 2012-02-19 MED ORDER — HYDROCERIN EX CREA
TOPICAL_CREAM | Freq: Two times a day (BID) | CUTANEOUS | Status: DC
  Administered 2012-02-19 – 2012-02-23 (×7): via TOPICAL
  Administered 2012-02-24: 1 via TOPICAL
  Filled 2012-02-19: qty 113

## 2012-02-19 MED ORDER — FAMOTIDINE IN NACL 20-0.9 MG/50ML-% IV SOLN
20.0000 mg | Freq: Two times a day (BID) | INTRAVENOUS | Status: DC
  Administered 2012-02-19 (×2): 20 mg via INTRAVENOUS
  Filled 2012-02-19 (×3): qty 50

## 2012-02-19 MED ORDER — ASPIRIN EC 81 MG PO TBEC: 81.0000 mg | DELAYED_RELEASE_TABLET | Freq: Every day | ORAL | Status: DC

## 2012-02-19 MED ORDER — PREDNISONE 20 MG PO TABS
20.0000 mg | ORAL_TABLET | Freq: Every day | ORAL | Status: DC
  Administered 2012-02-19 – 2012-02-22 (×4): 20 mg via ORAL
  Filled 2012-02-19 (×5): qty 1

## 2012-02-19 MED ORDER — INSULIN ASPART 100 UNIT/ML ~~LOC~~ SOLN
0.0000 [IU] | Freq: Three times a day (TID) | SUBCUTANEOUS | Status: DC
Start: 2012-02-20 — End: 2012-02-24
  Administered 2012-02-20: 3 [IU] via SUBCUTANEOUS
  Administered 2012-02-20 – 2012-02-21 (×3): 5 [IU] via SUBCUTANEOUS
  Administered 2012-02-21: 8 [IU] via SUBCUTANEOUS
  Administered 2012-02-21: 5 [IU] via SUBCUTANEOUS
  Administered 2012-02-22: 8 [IU] via SUBCUTANEOUS
  Administered 2012-02-22: 11 [IU] via SUBCUTANEOUS
  Administered 2012-02-22: 5 [IU] via SUBCUTANEOUS
  Administered 2012-02-23: 11 [IU] via SUBCUTANEOUS
  Administered 2012-02-23: 8 [IU] via SUBCUTANEOUS
  Administered 2012-02-23: 11 [IU] via SUBCUTANEOUS
  Administered 2012-02-24: 5 [IU] via SUBCUTANEOUS
  Administered 2012-02-24: 2 [IU] via SUBCUTANEOUS

## 2012-02-19 MED ORDER — SODIUM CHLORIDE 0.9 % IJ SOLN
3.0000 mL | Freq: Two times a day (BID) | INTRAMUSCULAR | Status: DC
  Administered 2012-02-19: 17:00:00 via INTRAVENOUS
  Administered 2012-02-20 – 2012-02-24 (×9): 3 mL via INTRAVENOUS

## 2012-02-19 MED ORDER — LABETALOL HCL 5 MG/ML IV SOLN
10.0000 mg | Freq: Four times a day (QID) | INTRAVENOUS | Status: DC | PRN
  Administered 2012-02-20: 10 mg via INTRAVENOUS
  Filled 2012-02-19: qty 4

## 2012-02-19 MED ORDER — FUROSEMIDE 10 MG/ML IJ SOLN
60.0000 mg | Freq: Once | INTRAMUSCULAR | Status: AC
  Administered 2012-02-19: 60 mg via INTRAVENOUS
  Filled 2012-02-19: qty 6

## 2012-02-19 MED ORDER — RAMIPRIL 10 MG PO CAPS: 10.0000 mg | ORAL_CAPSULE | Freq: Two times a day (BID) | ORAL | Status: DC

## 2012-02-19 MED ORDER — ONDANSETRON HCL 4 MG/2ML IJ SOLN
4.0000 mg | Freq: Four times a day (QID) | INTRAMUSCULAR | Status: DC | PRN
  Administered 2012-02-19 – 2012-02-20 (×3): 4 mg via INTRAVENOUS
  Filled 2012-02-19 (×3): qty 2

## 2012-02-19 NOTE — Care Management Note (Signed)
    Page 1 of 1   02/19/2012     2:16:25 PM   CARE MANAGEMENT NOTE 02/19/2012  Patient:  [name], [name]   Account Number:  [id number]  Date Initiated:  02/19/2012  Documentation initiated by:  Junius Creamer  Subjective/Objective Assessment:   adm w htn, hx chf     Action/Plan:   lives w wife, pcp dr Sharyn Lull   Anticipated DC Date:     Anticipated DC Plan:        DC Planning Services  CM consult      Choice offered to / List presented to:             Status of service:   Medicare Important Message given?   (If response is "NO", the following Medicare IM given date fields will be blank) Date Medicare IM given:   Date Additional Medicare IM given:    Discharge Disposition:    Per UR Regulation:  Reviewed for med. necessity/level of care/duration of stay  If discussed at Long Length of Stay Meetings, dates discussed:    Comments:  9/13 14:14p debbie Bodee Lafoe rn,bsn [phone] will moniter for dc needs as pt progresses.

## 2012-02-19 NOTE — ED Notes (Signed)
Pt here via EMS with multiple complaints. H/A, HTN, back pain, blister big toe left foot, n/v yesterday. Pt d/c from hosp last week for htn admission. Pt has not taken BP meds due to nausea. Pt denies nausea at this time.

## 2012-02-19 NOTE — H&P (Signed)
[name] is an 66 y.o. male.   Chief Complaint: Vague abdominal pain headache nausea or vomiting HPI: Patient is 66 year old male with past medical history significant for hypertensive heart disease with systolic dysfunction history of congestive heart failure secondary to systolic dysfunction hypertension, non-insulin-dependent diabetes mellitus hypercholesteremia, chronic kidney disease, diabetic foot ulcer right foot, morbid obesity, came to the ER by EMS complaining of vague abdominal pain associated with nausea vomiting headache and dizziness. Patient states he is unable to keep down anything. Patient was noted to have blood pressure 220/106 and was noted to be in mild congestive heart failure. Patient does give history of PND orthopnea leg swelling. Denies any chest pain or shortness of breath. Denies any palpitation lightheadedness or syncope.  Past Medical History  Diagnosis Date  . Diabetes mellitus   . Hypertension   . Hyperlipidemia   . CHF (congestive heart failure)   . Enlarged heart     Past Surgical History  Procedure Date  . Right leg fracture   . Skin graft   . Knee surgery     No family history on file. Social History:  reports that he has never smoked. He does not have any smokeless tobacco history on file. He reports that he does not drink alcohol or use illicit drugs.  Allergies: No Known Allergies   (Not in a hospital admission)  Results for orders placed during the hospital encounter of 02/19/12 (from the past 48 hour(s))  TROPONIN I     Status: Normal   Collection Time   02/19/12  9:21 AM      Component Value Range Comment   Troponin I <0.30  <0.30 ng/mL   CBC WITH DIFFERENTIAL     Status: Abnormal   Collection Time   02/19/12  9:21 AM      Component Value Range Comment   WBC 13.1 (*) 4.0 - 10.5 K/uL    RBC 4.66  4.22 - 5.81 MIL/uL    Hemoglobin 12.7 (*) 13.0 - 17.0 g/dL    HCT 45.4 (*) 09.8 - 52.0 %    MCV 80.9  78.0 - 100.0 fL    MCH 27.3   26.0 - 34.0 pg    MCHC 33.7  30.0 - 36.0 g/dL    RDW 11.9  14.7 - 82.9 %    Platelets 204  150 - 400 K/uL    Neutrophils Relative 70  43 - 77 %    Neutro Abs 9.2 (*) 1.7 - 7.7 K/uL    Lymphocytes Relative 6 (*) 12 - 46 %    Lymphs Abs 0.8  0.7 - 4.0 K/uL    Monocytes Relative 6  3 - 12 %    Monocytes Absolute 0.8  0.1 - 1.0 K/uL    Eosinophils Relative 18 (*) 0 - 5 %    Eosinophils Absolute 2.3 (*) 0.0 - 0.7 K/uL    Basophils Relative 1  0 - 1 %    Basophils Absolute 0.1  0.0 - 0.1 K/uL   PROTIME-INR     Status: Normal   Collection Time   02/19/12  9:21 AM      Component Value Range Comment   Prothrombin Time 14.0  11.6 - 15.2 seconds    INR 1.06  0.00 - 1.49   COMPREHENSIVE METABOLIC PANEL     Status: Abnormal   Collection Time   02/19/12  9:21 AM      Component Value Range Comment   Sodium 138  135 -  145 mEq/L    Potassium 3.8  3.5 - 5.1 mEq/L    Chloride 98  96 - 112 mEq/L    CO2 29  19 - 32 mEq/L    Glucose, Bld 268 (*) 70 - 99 mg/dL    BUN 21  6 - 23 mg/dL    Creatinine, Ser 1.61  0.50 - 1.35 mg/dL    Calcium 9.3  8.4 - 09.6 mg/dL    Total Protein 7.2  6.0 - 8.3 g/dL    Albumin 3.3 (*) 3.5 - 5.2 g/dL    AST 13  0 - 37 U/L    ALT 8  0 - 53 U/L    Alkaline Phosphatase 79  39 - 117 U/L    Total Bilirubin 0.7  0.3 - 1.2 mg/dL    GFR calc non Af Amer 70 (*) >90 mL/min    GFR calc Af Amer 81 (*) >90 mL/min   PRO B NATRIURETIC PEPTIDE     Status: Abnormal   Collection Time   02/19/12  9:21 AM      Component Value Range Comment   Pro B Natriuretic peptide (BNP) 13940.0 (*) 0 - 125 pg/mL   LIPASE, BLOOD     Status: Normal   Collection Time   02/19/12  9:21 AM      Component Value Range Comment   Lipase 14  11 - 59 U/L   URINALYSIS, ROUTINE W REFLEX MICROSCOPIC     Status: Abnormal   Collection Time   02/19/12  9:55 AM      Component Value Range Comment   Color, Urine YELLOW  YELLOW    APPearance CLEAR  CLEAR    Specific Gravity, Urine 1.019  1.005 - 1.030    pH 6.5   5.0 - 8.0    Glucose, UA 250 (*) NEGATIVE mg/dL    Hgb urine dipstick SMALL (*) NEGATIVE    Bilirubin Urine NEGATIVE  NEGATIVE    Ketones, ur 15 (*) NEGATIVE mg/dL    Protein, ur 045 (*) NEGATIVE mg/dL    Urobilinogen, UA 2.0 (*) 0.0 - 1.0 mg/dL    Nitrite NEGATIVE  NEGATIVE    Leukocytes, UA NEGATIVE  NEGATIVE   URINE MICROSCOPIC-ADD ON     Status: Abnormal   Collection Time   02/19/12  9:55 AM      Component Value Range Comment   Squamous Epithelial / LPF RARE  RARE    WBC, UA 0-2  <3 WBC/hpf    RBC / HPF 3-6  <3 RBC/hpf    Bacteria, UA RARE  RARE    Casts HYALINE CASTS (*) NEGATIVE    Dg Chest 2 View  02/19/2012  *RADIOLOGY REPORT*  Clinical Data: Hypertension, rash, question of allergic reaction  CHEST - 2 VIEW  Comparison: Chest x-ray of 02/10/2012  Findings: There is cardiomegaly present.  In addition there are small pleural effusions a small amount of fluid in the fissures suggesting mild interstitial edema.  No infiltrate is seen.  No bony abnormalities noted.  IMPRESSION: Question mild edema with cardiomegaly and small effusions.   Original Report Authenticated By: Juline Patch, M.D.    Ct Head Wo Contrast  02/19/2012  *RADIOLOGY REPORT*  Clinical Data: Hypertension, headache and dizziness.  CT HEAD WITHOUT CONTRAST  Technique:  Contiguous axial images were obtained from the base of the skull through the vertex without contrast.  Comparison: 06/26/2011  Findings: There are stable calcifications in the basal ganglia and along the  left corona radiata.  There is stable mild cerebral atrophy.  No evidence for acute hemorrhage, mass lesion, midline shift, hydrocephalus or large infarct.  Stable appearance of the ventricles and basal cisterns.  There is mucosal thickening of the sphenoid sinuses and ethmoid air cells.  No acute bony abnormality.  IMPRESSION: No acute intracranial abnormality.  Stable cerebral atrophy.  Paranasal sinus disease.   Original Report Authenticated By: Richarda Overlie,  M.D.     Review of Systems  Constitutional: Negative for fever, chills and weight loss.  HENT: Negative for hearing loss and tinnitus.   Eyes: Negative for blurred vision and double vision.  Respiratory: Positive for cough. Negative for hemoptysis, sputum production and shortness of breath.   Cardiovascular: Positive for orthopnea, leg swelling and PND. Negative for chest pain and palpitations.  Gastrointestinal: Positive for nausea, vomiting and abdominal pain.  Neurological: Positive for dizziness and headaches.    Blood pressure 193/106, pulse 90, temperature 98 F (36.7 C), temperature source Oral, resp. rate 18, SpO2 98.00%. Physical Exam  Constitutional: He is oriented to person, place, and time. He appears well-developed and well-nourished.  HENT:  Head: Normocephalic and atraumatic.  Eyes: Conjunctivae normal are normal. Pupils are equal, round, and reactive to light. Left eye exhibits no discharge. No scleral icterus.  Neck: Normal range of motion. Neck supple. JVD present.  Cardiovascular: Normal rate and regular rhythm.  Exam reveals no friction rub.        Soft systolic murmur and S3 gallop an S4 gallop noted  Respiratory:       Decreased breath sound at bases with bibasilar Rales  GI: Soft. Bowel sounds are normal.       Mild generalized tenderness no guarding or rebound  Musculoskeletal:       No clubbing cyanosis 3+ edema noted  Neurological: He is alert and oriented to person, place, and time.  Skin: Skin is warm. Rash noted. There is erythema.     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

## 2012-02-19 NOTE — Consult Note (Signed)
WOC consult Note Reason for Consult: eval chronic foot ulcerations.  Pt reports he has a chronic foot ulcer on the right plantar foot that his podiatrist treats. It will "swell and fester and drain". Today at the time of my exam he does not have any open wounds. He does have a significant rash all over his body that has been present x 3 wks or more.   Does not recall any changes in meds to precipitate this rash.  He does have small fissure at the base of his great toe on the right foot.  He has darkened at the base of all of his toes on the plantar surface and along the medial plantar aspect of this same foot.  He has scattered darkened area of the plantar surface of the left foot as well. There is no significant edema in either LE.  He has normal sensation in both feet.   The darkened tissue is hardened but not eschar and does not present over bony aspects of the foot.  He has some erythema noted on the left malleolar region that appears to be related to the rash he has.  He has palpable pulses bil. Scarring noted center of the plantar surface of the met. Heads of the right foot, indicative of old ulcerations here.  Unclear etiology of the darkened areas.  Wound type:No open wounds at current time.    Dressing procedure/placement/frequency: no topical care recommended at this time.  Outpatient follow up with podiatry to monitor feet for changes.   May need dermatology to eval for workup for the rash that covers his entire body.  Re consult if needed, will not follow at this time. Thanks  Qusay Villada [employer], CWOCN [phone])

## 2012-02-19 NOTE — ED Provider Notes (Signed)
History     CSN: [id number]  Arrival date & time 02/19/12  4098   First MD Initiated Contact with Patient 02/19/12 0901      Chief Complaint  Patient presents with  . Hypertension    (Consider location/radiation/quality/duration/timing/severity/associated sxs/prior treatment) HPI Comments: Patient arrives via EMS with multiple complaints including headache, low back pain, nausea, vomiting and epigastric pain. He has a nonhealing wound on his right foot. He was seen one week ago for leg swelling and rash that was thought to be allergic reaction or drug rash. He states the rash is getting better. He denies any difficulty breathing or chest pain. He states he's been unable to medicines down at home. He endorses a gradual onset headache for the past 2 days. No visual change. Says he has not had a bowel movement 2 days. States anything he needs he throws up. Denies any weakness, numbness, tingling, bowel or bladder incontinence or fever.  The history is provided by the patient and the EMS personnel.    Past Medical History  Diagnosis Date  . Diabetes mellitus   . Hypertension   . Hyperlipidemia   . CHF (congestive heart failure)   . Enlarged heart     Past Surgical History  Procedure Date  . Right leg fracture   . Skin graft   . Knee surgery     History reviewed. No pertinent family history.  History  Substance Use Topics  . Smoking status: Never Smoker   . Smokeless tobacco: Not on file  . Alcohol Use: No      Review of Systems  Constitutional: Positive for activity change, appetite change and fatigue. Negative for fever.  HENT: Positive for congestion and rhinorrhea.   Respiratory: Positive for shortness of breath. Negative for cough and chest tightness.   Cardiovascular: Negative for chest pain.  Gastrointestinal: Positive for nausea, vomiting and abdominal pain.  Genitourinary: Positive for genital sores. Negative for dysuria and hematuria.  Musculoskeletal:  Positive for myalgias, back pain and arthralgias.  Skin: Positive for rash and wound.  Neurological: Positive for weakness and headaches.    Allergies  Review of patient's allergies indicates no known allergies.  Home Medications   No current outpatient prescriptions on file.  BP 179/97  Pulse 93  Temp 98.7 F (37.1 C) (Oral)  Resp 20  Ht 5\' 11"  (1.803 m)  Wt 257 lb 15 oz (117 kg)  BMI 35.98 kg/m2  SpO2 92%  Physical Exam  Constitutional: He is oriented to person, place, and time. He appears well-developed and well-nourished. No distress.  HENT:  Head: Normocephalic and atraumatic.  Mouth/Throat: Oropharynx is clear and moist. No oropharyngeal exudate.  Eyes: Conjunctivae normal and EOM are normal. Pupils are equal, round, and reactive to light.  Neck: Normal range of motion. Neck supple.  Cardiovascular: Normal rate, regular rhythm and normal heart sounds.   No murmur heard. Pulmonary/Chest: Effort normal and breath sounds normal.  Abdominal: Soft. There is no tenderness. There is no rebound and no guarding.  Musculoskeletal: Normal range of motion. He exhibits edema.       +2 pitting edema to mid tibia bilaterally  Chronic ulceration plantar right foot does not appear to be infected. Intact distal pulses  Neurological: He is oriented to person, place, and time.  Skin: Skin is warm. Rash noted.       Diffuse erythematous macular papular rash.    ED Course  Procedures (including critical care time)  Labs Reviewed  CBC WITH  DIFFERENTIAL - Abnormal; Notable for the following:    WBC 13.1 (*)     Hemoglobin 12.7 (*)     HCT 37.7 (*)     Neutro Abs 9.2 (*)     Lymphocytes Relative 6 (*)     Eosinophils Relative 18 (*)     Eosinophils Absolute 2.3 (*)     All other components within normal limits  COMPREHENSIVE METABOLIC PANEL - Abnormal; Notable for the following:    Glucose, Bld 268 (*)     Albumin 3.3 (*)     GFR calc non Af Amer 70 (*)     GFR calc Af Amer 81  (*)     All other components within normal limits  PRO B NATRIURETIC PEPTIDE - Abnormal; Notable for the following:    Pro B Natriuretic peptide (BNP) 13940.0 (*)     All other components within normal limits  URINALYSIS, ROUTINE W REFLEX MICROSCOPIC - Abnormal; Notable for the following:    Glucose, UA 250 (*)     Hgb urine dipstick SMALL (*)     Ketones, ur 15 (*)     Protein, ur 100 (*)     Urobilinogen, UA 2.0 (*)     All other components within normal limits  URINE MICROSCOPIC-ADD ON - Abnormal; Notable for the following:    Casts HYALINE CASTS (*)     All other components within normal limits  CBC WITH DIFFERENTIAL - Abnormal; Notable for the following:    WBC 13.2 (*)     Hemoglobin 12.1 (*)     HCT 36.6 (*)     Neutro Abs 9.1 (*)     Lymphocytes Relative 8 (*)     Eosinophils Relative 17 (*)     Eosinophils Absolute 2.2 (*)     All other components within normal limits  COMPREHENSIVE METABOLIC PANEL - Abnormal; Notable for the following:    Glucose, Bld 276 (*)     Albumin 3.2 (*)     GFR calc non Af Amer 73 (*)     GFR calc Af Amer 85 (*)     All other components within normal limits  PRO B NATRIURETIC PEPTIDE - Abnormal; Notable for the following:    Pro B Natriuretic peptide (BNP) 15285.0 (*)     All other components within normal limits  TROPONIN I  PROTIME-INR  LIPASE, BLOOD  APTT  PROTIME-INR  MAGNESIUM  TROPONIN I  MRSA PCR SCREENING  TSH  TROPONIN I  TROPONIN I  CBC   Dg Chest 2 View  02/19/2012  *RADIOLOGY REPORT*  Clinical Data: Hypertension, rash, question of allergic reaction  CHEST - 2 VIEW  Comparison: Chest x-ray of 02/10/2012  Findings: There is cardiomegaly present.  In addition there are small pleural effusions a small amount of fluid in the fissures suggesting mild interstitial edema.  No infiltrate is seen.  No bony abnormalities noted.  IMPRESSION: Question mild edema with cardiomegaly and small effusions.   Original Report Authenticated By:  Juline Patch, M.D.    Ct Head Wo Contrast  02/19/2012  *RADIOLOGY REPORT*  Clinical Data: Hypertension, headache and dizziness.  CT HEAD WITHOUT CONTRAST  Technique:  Contiguous axial images were obtained from the base of the skull through the vertex without contrast.  Comparison: 06/26/2011  Findings: There are stable calcifications in the basal ganglia and along the left corona radiata.  There is stable mild cerebral atrophy.  No evidence for acute hemorrhage, mass lesion, midline  shift, hydrocephalus or large infarct.  Stable appearance of the ventricles and basal cisterns.  There is mucosal thickening of the sphenoid sinuses and ethmoid air cells.  No acute bony abnormality.  IMPRESSION: No acute intracranial abnormality.  Stable cerebral atrophy.  Paranasal sinus disease.   Original Report Authenticated By: Richarda Overlie, M.D.      1. CHF exacerbation       MDM  Hypertension, headache, low back pain, nausea and vomiting. Noncompliance of medications secondary to vomiting.  Patient is mildly fluid overloaded on exam with lower extremity edema and rales. He states he's been able to keep his medications down. He is significantly hypertensive.  We'll treat with aspirin, Lasix, nitroglycerin IV. Admission discussed with Dr. Sharyn Lull.     Date: 02/19/2012  Rate: 87  Rhythm: normal sinus rhythm  QRS Axis: normal  Intervals: normal  ST/T Wave abnormalities: normal  Conduction Disutrbances:none  Narrative Interpretation:   Old EKG Reviewed: unchanged  CRITICAL CARE Performed by: Glynn Octave   Total critical care time: 30  Critical care time was exclusive of separately billable procedures and treating other patients.  Critical care was necessary to treat or prevent imminent or life-threatening deterioration.  Critical care was time spent personally by me on the following activities: development of treatment plan with patient and/or surrogate as well as nursing, discussions with  consultants, evaluation of patient's response to treatment, examination of patient, obtaining history from patient or surrogate, ordering and performing treatments and interventions, ordering and review of laboratory studies, ordering and review of radiographic studies, pulse oximetry and re-evaluation of patient's condition.   Glynn Octave, MD 02/19/12 [phone]

## 2012-02-20 LAB — BASIC METABOLIC PANEL
BUN: 25 mg/dL — ABNORMAL HIGH (ref 6–23)
Chloride: 97 mEq/L (ref 96–112)
GFR calc Af Amer: 58 mL/min — ABNORMAL LOW (ref >90)
Potassium: 4.3 mEq/L (ref 3.5–5.1)

## 2012-02-20 LAB — GLUCOSE, CAPILLARY: Glucose-Capillary: 256 mg/dL — ABNORMAL HIGH (ref 70–99)

## 2012-02-20 LAB — TROPONIN I: Troponin I: <0.3 ng/mL (ref <0.30)

## 2012-02-20 MED ORDER — PNEUMOCOCCAL VAC POLYVALENT 25 MCG/0.5ML IJ INJ
0.5000 mL | INJECTION | INTRAMUSCULAR | Status: AC
  Administered 2012-02-21: 0.5 mL via INTRAMUSCULAR
  Filled 2012-02-20: qty 0.5

## 2012-02-20 MED ORDER — FAMOTIDINE 20 MG PO TABS
20.0000 mg | ORAL_TABLET | Freq: Two times a day (BID) | ORAL | Status: DC
  Administered 2012-02-20 – 2012-02-24 (×9): 20 mg via ORAL
  Filled 2012-02-20 (×12): qty 1

## 2012-02-20 MED ORDER — INFLUENZA VIRUS VACC SPLIT PF IM SUSP
0.5000 mL | INTRAMUSCULAR | Status: AC
  Administered 2012-02-21: 0.5 mL via INTRAMUSCULAR
  Filled 2012-02-20: qty 0.5

## 2012-02-20 NOTE — Progress Notes (Signed)
Subjective:  No chest pain. Deceasing shortness of breath.  Objective:  Vital Signs in the last 24 hours: Temp:  [98 F (36.7 C)-99.4 F (37.4 C)] 98.3 F (36.8 C) (09/14 0758) Pulse Rate:  [79-95] 79  (09/14 0400) Cardiac Rhythm:  [-] Normal sinus rhythm (09/14 0730) Resp:  [0-24] 18  (09/14 0758) BP: (112-211)/(56-143) 112/87 mmHg (09/14 0730) SpO2:  [90 %-100 %] 93 % (09/14 0758) Weight:  [117 kg (257 lb 15 oz)] 117 kg (257 lb 15 oz) (09/13 1352)  Physical Exam: BP Readings from Last 1 Encounters:  02/20/12 112/87    Wt Readings from Last 1 Encounters:  02/19/12 117 kg (257 lb 15 oz)    Weight change:   HEENT: [place]/AT, Eyes-Brown, PERL, EOMI, Conjunctiva-Pink, Sclera-Non-icteric Neck: + JVD, No bruit, Trachea midline. Lungs:  Clear, Bilateral. Cardiac:  Regular rhythm, normal S1 and S2, no S3. II/VI systolic murmur. Abdomen:  Soft, non-tender. Extremities:  2 + edema present. No cyanosis. No clubbing. CNS: AxOx3, Cranial nerves grossly intact, moves all 4 extremities. Right handed. Skin: Warm and dry.   Intake/Output from previous day: 09/13 0701 - 09/14 0700 In: 658.9 [P.O.:240; I.V.:358.9; IV Piggyback:60] Out: 1600 [Urine:1600]    Lab Results: BMET    Component Value Date/Time   NA 140 02/20/2012 0141   K 4.3 02/20/2012 0141   CL 97 02/20/2012 0141   CO2 34* 02/20/2012 0141   GLUCOSE 232* 02/20/2012 0141   BUN 25* 02/20/2012 0141   CREATININE 1.41* 02/20/2012 0141   CALCIUM 9.2 02/20/2012 0141   GFRNONAA 50* 02/20/2012 0141   GFRAA 58* 02/20/2012 0141   CBC    Component Value Date/Time   WBC 13.2* 02/19/2012 1438   RBC 4.60 02/19/2012 1438   HGB 12.1* 02/19/2012 1438   HCT 36.6* 02/19/2012 1438   PLT 192 02/19/2012 1438   MCV 79.6 02/19/2012 1438   MCH 26.3 02/19/2012 1438   MCHC 33.1 02/19/2012 1438   RDW 14.2 02/19/2012 1438   LYMPHSABS 1.1 02/19/2012 1438   MONOABS 0.7 02/19/2012 1438   EOSABS 2.2* 02/19/2012 1438   BASOSABS 0.1 02/19/2012 1438   CARDIAC  ENZYMES Lab Results  Component Value Date   CKTOTAL 108 08/04/2011   CKMB 5.1* 08/04/2011   TROPONINI <0.30 02/20/2012    Assessment/Plan:  Patient Active Hospital Problem List: Acute on chronic left heart systolic failure.  Hypertensive urgency   Non-insulin-dependent diabetes matters  Nonischemic cardiomyopathy  Chronic kidney disease stage II  Chronic diabetic right foot ulcer  Morbid obesity  Allergic dermatitis  Continue IV lasix.   LOS: 1 day    Orpah Cobb  MD  02/20/2012, 9:53 AM

## 2012-02-21 LAB — BASIC METABOLIC PANEL
BUN: 44 mg/dL — ABNORMAL HIGH (ref 6–23)
CO2: 35 mEq/L — ABNORMAL HIGH (ref 19–32)
Calcium: 9.1 mg/dL (ref 8.4–10.5)
Creatinine, Ser: 2.35 mg/dL — ABNORMAL HIGH (ref 0.50–1.35)
Glucose, Bld: 223 mg/dL — ABNORMAL HIGH (ref 70–99)

## 2012-02-21 LAB — GLUCOSE, CAPILLARY: Glucose-Capillary: 222 mg/dL — ABNORMAL HIGH (ref 70–99)

## 2012-02-21 MED ORDER — FUROSEMIDE 80 MG PO TABS
80.0000 mg | ORAL_TABLET | Freq: Every day | ORAL | Status: DC
  Administered 2012-02-22 – 2012-02-24 (×3): 80 mg via ORAL
  Filled 2012-02-21 (×3): qty 1

## 2012-02-21 MED ORDER — SODIUM CHLORIDE 0.9 % IV BOLUS (SEPSIS)
250.0000 mL | Freq: Once | INTRAVENOUS | Status: AC
  Administered 2012-02-21: 250 mL via INTRAVENOUS

## 2012-02-21 MED ORDER — ALBUMIN HUMAN 25 % IV SOLN
12.5000 g | Freq: Once | INTRAVENOUS | Status: AC
  Administered 2012-02-21: 12.5 g via INTRAVENOUS
  Filled 2012-02-21: qty 50

## 2012-02-21 MED ORDER — POTASSIUM CHLORIDE CRYS ER 10 MEQ PO TBCR
10.0000 meq | EXTENDED_RELEASE_TABLET | Freq: Two times a day (BID) | ORAL | Status: DC
  Administered 2012-02-21 – 2012-02-24 (×6): 10 meq via ORAL
  Filled 2012-02-21 (×9): qty 1

## 2012-02-21 NOTE — Progress Notes (Signed)
  Echocardiogram 2D Echocardiogram has been performed.  Muriel Wilber 02/21/2012, 10:40 AM

## 2012-02-21 NOTE — Progress Notes (Signed)
Subjective:  Feeling better.  Objective:  Vital Signs in the last 24 hours: Temp:  [98.3 F (36.8 C)-99.5 F (37.5 C)] 98.9 F (37.2 C) (09/15 0734) Pulse Rate:  [76-81] 76  (09/15 0345) Cardiac Rhythm:  [-] Normal sinus rhythm (09/14 1618) Resp:  [14-20] 16  (09/15 0734) BP: (99-145)/(46-57) 107/53 mmHg (09/15 0032) SpO2:  [88 %-95 %] 90 % (09/15 0734) Weight:  [109.7 kg (241 lb 13.5 oz)] 109.7 kg (241 lb 13.5 oz) (09/15 0500)  Physical Exam: BP Readings from Last 1 Encounters:  02/21/12 107/53    Wt Readings from Last 1 Encounters:  02/21/12 109.7 kg (241 lb 13.5 oz)    Weight change: -7.3 kg (-16 lb 1.5 oz)  HEENT: [place]/AT, Eyes-Brown, PERL, EOMI, Conjunctiva-Pink, Sclera-Non-icteric Neck: No JVD, No bruit, Trachea midline. Lungs:  Clear, Bilateral. Cardiac:  Regular rhythm, normal S1 and S2, no S3. II/VI systolic murmur Abdomen:  Soft, non-tender. Extremities:  Trace edema present. No cyanosis. No clubbing. CNS: AxOx3, Cranial nerves grossly intact, moves all 4 extremities. Right handed. Skin: Warm and dry.   Intake/Output from previous day: 09/14 0701 - 09/15 0700 In: 954 [P.O.:840; I.V.:94; IV Piggyback:20] Out: 1600 [Urine:1600]    Lab Results: BMET    Component Value Date/Time   NA 140 02/21/2012 0508   K 3.6 02/21/2012 0508   CL 96 02/21/2012 0508   CO2 35* 02/21/2012 0508   GLUCOSE 223* 02/21/2012 0508   BUN 44* 02/21/2012 0508   CREATININE 2.35* 02/21/2012 0508   CALCIUM 9.1 02/21/2012 0508   GFRNONAA 27* 02/21/2012 0508   GFRAA 31* 02/21/2012 0508   CBC    Component Value Date/Time   WBC 13.2* 02/19/2012 1438   RBC 4.60 02/19/2012 1438   HGB 12.1* 02/19/2012 1438   HCT 36.6* 02/19/2012 1438   PLT 192 02/19/2012 1438   MCV 79.6 02/19/2012 1438   MCH 26.3 02/19/2012 1438   MCHC 33.1 02/19/2012 1438   RDW 14.2 02/19/2012 1438   LYMPHSABS 1.1 02/19/2012 1438   MONOABS 0.7 02/19/2012 1438   EOSABS 2.2* 02/19/2012 1438   BASOSABS 0.1 02/19/2012 1438   CARDIAC  ENZYMES Lab Results  Component Value Date   CKTOTAL 108 08/04/2011   CKMB 5.1* 08/04/2011   TROPONINI <0.30 02/20/2012   2-D echo-moderate LVH with mild LV systolic and diastolic dysfunction. Dilated LA.  Assessment/Plan:  Patient Active Hospital Problem List: Acute on chronic left heart systolic failure.  Hypertensive urgency  Non-insulin-dependent diabetes matters  Nonischemic cardiomyopathy  Chronic kidney disease stage II  Chronic diabetic right foot ulcer  Morbid obesity  Allergic dermatitis Hypoalbuminemia  Decrease Lasix and KCl Add IV albumin   LOS: 2 days    Orpah Cobb  MD  02/21/2012, 10:53 AM

## 2012-02-22 LAB — GLUCOSE, CAPILLARY
Glucose-Capillary: 213 mg/dL — ABNORMAL HIGH (ref 70–99)
Glucose-Capillary: 278 mg/dL — ABNORMAL HIGH (ref 70–99)
Glucose-Capillary: 287 mg/dL — ABNORMAL HIGH (ref 70–99)
Glucose-Capillary: 336 mg/dL — ABNORMAL HIGH (ref 70–99)
Glucose-Capillary: 319 mg/dL — ABNORMAL HIGH (ref 70–99)

## 2012-02-22 LAB — BASIC METABOLIC PANEL
BUN: 53 mg/dL — ABNORMAL HIGH (ref 6–23)
Creatinine, Ser: 2.17 mg/dL — ABNORMAL HIGH (ref 0.50–1.35)
GFR calc Af Amer: 34 mL/min — ABNORMAL LOW (ref >90)
GFR calc non Af Amer: 30 mL/min — ABNORMAL LOW (ref >90)
Glucose, Bld: 297 mg/dL — ABNORMAL HIGH (ref 70–99)
Potassium: 3.9 mEq/L (ref 3.5–5.1)

## 2012-02-22 LAB — CBC
HCT: 35.2 % — ABNORMAL LOW (ref 39.0–52.0)
Hemoglobin: 11.7 g/dL — ABNORMAL LOW (ref 13.0–17.0)
MCH: 26.9 pg (ref 26.0–34.0)
MCHC: 33.2 g/dL (ref 30.0–36.0)
RDW: 14.7 % (ref 11.5–15.5)

## 2012-02-22 LAB — URIC ACID: Uric Acid, Serum: 11.4 mg/dL — ABNORMAL HIGH (ref 4.0–7.8)

## 2012-02-22 MED ORDER — PREDNISONE 10 MG PO TABS
10.0000 mg | ORAL_TABLET | Freq: Every day | ORAL | Status: DC
  Administered 2012-02-23 – 2012-02-24 (×2): 10 mg via ORAL
  Filled 2012-02-22 (×4): qty 1

## 2012-02-22 NOTE — Progress Notes (Signed)
Inpatient Diabetes Program Recommendations  AACE/ADA: New Consensus Statement on Inpatient Glycemic Control (2013)  Target Ranges:  Prepandial:   less than 140 mg/dL      Peak postprandial:   less than 180 mg/dL (1-2 hours)      Critically ill patients:  140 - 180 mg/dL   Results for [name], [name] (MRN [id number]) as of 02/22/2012 11:06  Ref. Range 02/21/2012 07:36 02/21/2012 12:28 02/21/2012 17:13 02/21/2012 22:18 02/22/2012 05:41 02/22/2012 07:48  Glucose-Capillary Latest Range: 70-99 mg/dL 956 (H) 213 (H) 086 (H) 383 (H) 278 (H) 234 (H)    Inpatient Diabetes Program Recommendations Insulin - Basal: Add Lantus 15 units  HgbA1C: check A1C  Thank you  Piedad Climes RN,BSN,CDE Inpatient Diabetes Coordinator [phone] (team pager)

## 2012-02-22 NOTE — Progress Notes (Signed)
Pt had BP 80/40; pt asymptomatic; denies dizziness; a/ox3; Dr. Algie Coffer paged; page returned; new order for NS 250cc Bolus; will continue to assess

## 2012-02-22 NOTE — Progress Notes (Signed)
Subjective:  Patient denies any chest pain states breathing is improved. Blood sugar stays high on steroids. Rash improved  Objective:  Vital Signs in the last 24 hours: Temp:  [97.9 F (36.6 C)-98.7 F (37.1 C)] 98.4 F (36.9 C) (09/16 1130) Pulse Rate:  [74-76] 74  (09/16 1130) Resp:  [16-18] 16  (09/16 1130) BP: (80-143)/(43-71) 100/51 mmHg (09/16 1130) SpO2:  [93 %-95 %] 95 % (09/16 1130) Weight:  [109.7 kg (241 lb 13.5 oz)] 109.7 kg (241 lb 13.5 oz) (09/16 0500)  Intake/Output from previous day: 09/15 0701 - 09/16 0700 In: 1083 [P.O.:1080; I.V.:3] Out: 1600 [Urine:1600] Intake/Output from this shift: Total I/O In: -  Out: 200 [Urine:200]  Physical Exam: Neck: no adenopathy, no carotid bruit, no JVD and supple, symmetrical, trachea midline Lungs: clear to auscultation bilaterally Heart: regular rate and rhythm, S1, S2 normal and Soft systolic murmur and S3 gallop noted Abdomen: soft, non-tender; bowel sounds normal; no masses,  no organomegaly Extremities: extremities normal, atraumatic, no cyanosis or edema  Lab Results:  Basename 02/22/12 0527 02/19/12 1438  WBC 16.2* 13.2*  HGB 11.7* 12.1*  PLT 183 192    Basename 02/22/12 0527 02/21/12 0508  NA 136 140  K 3.9 3.6  CL 95* 96  CO2 32 35*  GLUCOSE 297* 223*  BUN 53* 44*  CREATININE 2.17* 2.35*    Basename 02/20/12 0142 02/19/12 2032  TROPONINI <0.30 <0.30   Hepatic Function Panel  Basename 02/19/12 1438  PROT 6.7  ALBUMIN 3.2*  AST 12  ALT 7  ALKPHOS 74  BILITOT 0.6  BILIDIR --  IBILI --   No results found for this basename: CHOL in the last 72 hours No results found for this basename: PROTIME in the last 72 hours  Imaging: Imaging results have been reviewed and No results found.  Cardiac Studies:  Assessment/Plan:  Status post Hypertensive urgency  Resolving Decompensated systolic heart failure  Non-insulin-dependent diabetes matters  Nonischemic cardiomyopathy  Acute on Chronic  kidney disease improved Chronic diabetic right foot ulcer  Morbid obesity  Status post Allergy dermatitis Plan Wean off steroids Check labs in a.m. Transfer to telemetry Home soon t LOS: 3 days    Mariacristina Aday N 02/22/2012, 1:44 PM

## 2012-02-22 NOTE — Clinical Documentation Improvement (Signed)
Hypertension Documentation Clarification Query  THIS DOCUMENT IS NOT A PERMANENT PART OF THE MEDICAL RECORD  TO RESPOND TO THE THIS QUERY, FOLLOW THE INSTRUCTIONS BELOW:  1. If needed, update documentation for the patient's encounter via the notes activity.  2. Access this query again and click edit on the In [employer].  3. After updating, or not, click F2 to complete all highlighted (required) fields concerning your review. Select "additional documentation in the medical record" OR "no additional documentation provided".  4. Click Sign note button.  5. The deficiency will fall out of your In Basket *Please let us know if you are not able to complete this workflow by phone or e-mail (listed below).        02/22/12  Dear Dr. Sharyn Lull Marton Redwood  In an effort to better capture your patient's severity of illness, reflect appropriate length of stay and utilization of resources, a review of the patient medical record has revealed the following indicators. Based on your clinical judgment, please clarify and document in a progress note and/or discharge summary the clinical condition associated with the following supporting information: In responding to this query please exercise your independent judgment.  The fact that a query is asked, does not imply that any particular answer is desired or expected.  Possible Clinical Conditions?   " Accelerated Hypertension  " Malignant Hypertension  " Or Other Condition  " Cannot Clinically Determine   Supporting Information:Please clarify if hypertensive urgency could be further specified based upon the clinical                                            indicators   Risk Factors: CKD, Hypertensive Heart Disease;   Signs and Symptoms: SBP :223 DBP :130  Diagnostics: Troponin level: CPK: 113 CPKMB:5.9 Echo: Left ventricle: The cavity size was normal. There was moderate concentric hypertrophy. Systolic function was severely reduced. The  estimated ejection fraction was in the range of 25% to 30%. Regional wall motion abnormalities cannot be excluded.  Radiology: Question mild edema with cardiomegaly and small effusions. Treatment: aldactone, coreg,  Drips: Nitroglycerin 0.2 mg/ml in dextrose 5% infusion 2-200 mcg/min; 0.6-60 ml/hr IV Medications: labetolol; ;lasix 20-40 mg/mn; apresoline 10 mgs 0.5 ml   You may use possible, probable, or suspect with inpatient documentation. Possible, probable, suspected diagnoses MUST be documented at the time of discharge.  Reviewed: additional documentation in the medical record [facility]  Thank You,  Amada Kingfisher  RN, BSN, CCM  Clinical Documentation Specialist: [phone] [email or web address]  Health Information Management Pink

## 2012-02-22 NOTE — Progress Notes (Signed)
pts CBG = 378; pt had just eaten a snack consisting of cheerios, 2% milk prior to blood sugar being checked; will reassess

## 2012-02-23 LAB — BASIC METABOLIC PANEL
Calcium: 8.9 mg/dL (ref 8.4–10.5)
Creatinine, Ser: 1.99 mg/dL — ABNORMAL HIGH (ref 0.50–1.35)
GFR calc Af Amer: 38 mL/min — ABNORMAL LOW (ref >90)
GFR calc non Af Amer: 33 mL/min — ABNORMAL LOW (ref >90)
Sodium: 140 mEq/L (ref 135–145)

## 2012-02-23 LAB — GLUCOSE, CAPILLARY
Glucose-Capillary: 276 mg/dL — ABNORMAL HIGH (ref 70–99)
Glucose-Capillary: 321 mg/dL — ABNORMAL HIGH (ref 70–99)

## 2012-02-23 LAB — CBC
MCH: 26.1 pg (ref 26.0–34.0)
MCV: 81.2 fL (ref 78.0–100.0)
Platelets: 184 10*3/uL (ref 150–400)
RDW: 14.7 % (ref 11.5–15.5)

## 2012-02-23 MED ORDER — GLIPIZIDE ER 5 MG PO TB24
5.0000 mg | ORAL_TABLET | Freq: Every day | ORAL | Status: DC
  Administered 2012-02-23 – 2012-02-24 (×2): 5 mg via ORAL
  Filled 2012-02-23 (×3): qty 1

## 2012-02-23 MED ORDER — ISOSORB DINITRATE-HYDRALAZINE 20-37.5 MG PO TABS
2.0000 | ORAL_TABLET | Freq: Three times a day (TID) | ORAL | Status: DC
  Administered 2012-02-23 – 2012-02-24 (×3): 2 via ORAL
  Filled 2012-02-23 (×6): qty 2

## 2012-02-23 NOTE — Progress Notes (Signed)
Subjective:  Patient denies any chest pain or shortness of breath. Denies any palpitations. She had episode of nonsustained SVT on the monitor earlier this morning asymptomatic renal function gradually improving.  Objective:  Vital Signs in the last 24 hours: Temp:  [97.8 F (36.6 C)-99.6 F (37.6 C)] 98.8 F (37.1 C) (09/17 0642) Pulse Rate:  [73-77] 77  (09/17 0642) Resp:  [16-20] 18  (09/17 0642) BP: (100-150)/(43-79) 150/79 mmHg (09/17 0642) SpO2:  [90 %-97 %] 97 % (09/17 0642) Weight:  [109.1 kg (240 lb 8.4 oz)-110 kg (242 lb 8.1 oz)] 109.1 kg (240 lb 8.4 oz) (09/17 1610)  Intake/Output from previous day: 09/16 0701 - 09/17 0700 In: 960 [P.O.:960] Out: 650 [Urine:650] Intake/Output from this shift: Total I/O In: 240 [P.O.:240] Out: 150 [Urine:150]  Physical Exam: Neck: no adenopathy, no carotid bruit, no JVD and supple, symmetrical, trachea midline Lungs: clear to auscultation bilaterally Heart: regular rate and rhythm, S1, S2 normal and Soft systolic murmur and S3 gallop noted Abdomen: soft, non-tender; bowel sounds normal; no masses,  no organomegaly Extremities: extremities normal, atraumatic, no cyanosis or edema  Lab Results:  Basename 02/23/12 0600 02/22/12 0527  WBC 14.6* 16.2*  HGB 11.1* 11.7*  PLT 184 183    Basename 02/23/12 0600 02/22/12 0527  NA 140 136  K 3.9 3.9  CL 99 95*  CO2 34* 32  GLUCOSE 286* 297*  BUN 54* 53*  CREATININE 1.99* 2.17*   No results found for this basename: TROPONINI:2,CK,MB:2 in the last 72 hours Hepatic Function Panel No results found for this basename: PROT,ALBUMIN,AST,ALT,ALKPHOS,BILITOT,BILIDIR,IBILI in the last 72 hours No results found for this basename: CHOL in the last 72 hours No results found for this basename: PROTIME in the last 72 hours  Imaging: Imaging results have been reviewed and No results found.  Cardiac Studies:  Assessment/Plan:  Status post Hypertensive urgency  Status post malignant  hypertension Status post nonsustained SVT asymptomatic Resolving Decompensated systolic heart failure  Non-insulin-dependent diabetes matters  Nonischemic cardiomyopathy  Acute on Chronic kidney disease improved  Chronic diabetic right foot ulcer  Morbid obesity  Status post Allergy dermatitis  Plan As per orders  LOS: 4 days    [name] N 02/23/2012, 10:27 AM

## 2012-02-24 LAB — BASIC METABOLIC PANEL
Calcium: 9.2 mg/dL (ref 8.4–10.5)
GFR calc non Af Amer: 38 mL/min — ABNORMAL LOW (ref >90)
Sodium: 140 mEq/L (ref 135–145)

## 2012-02-24 LAB — MAGNESIUM: Magnesium: 2.4 mg/dL (ref 1.5–2.5)

## 2012-02-24 MED ORDER — POTASSIUM CHLORIDE CRYS ER 20 MEQ PO TBCR: 20.0000 meq | EXTENDED_RELEASE_TABLET | Freq: Every day | ORAL | Status: DC

## 2012-02-24 MED ORDER — ISOSORB DINITRATE-HYDRALAZINE 20-37.5 MG PO TABS: 2.0000 | ORAL_TABLET | Freq: Three times a day (TID) | ORAL | Status: DC

## 2012-02-24 MED ORDER — GLIPIZIDE ER 5 MG PO TB24: 5.0000 mg | ORAL_TABLET | Freq: Every day | ORAL | Status: DC

## 2012-02-24 NOTE — Discharge Summary (Signed)
  Discharge summary dictated on 02/24/2012 dictation [phone]

## 2012-02-24 NOTE — Progress Notes (Signed)
Patient discharged with all discharge paperwork and patients wife signed and verbalizes understanding of all discharge instructions.

## 2012-02-25 LAB — GLUCOSE, CAPILLARY

## 2012-02-25 NOTE — Discharge Summary (Signed)
[name], [name]             ACCOUNT NO.:  [id number]  MEDICAL RECORD NO.:  [id number]  LOCATION:  4710                         FACILITY:  [facility]  PHYSICIAN:  Eduardo Osier. Sharyn Lull, M.D. DATE OF BIRTH:  06-19-1945  DATE OF ADMISSION:  02/19/2012 DATE OF DISCHARGE:  02/24/2012                              DISCHARGE SUMMARY   ADMITTING DIAGNOSES: 1. Hypertensive urgency/malignant hypertension. 2. Decompensated systolic heart failure. 3. Abdominal pain, questionable etiology. 4. Non-insulin-dependent diabetes mellitus. 5. Nonischemic cardiomyopathy. 6. Chronic kidney disease, stage II. 7. Chronic diabetic right foot ulcer. 8. Morbid obesity. 9. Allergic dermatitis.  DISCHARGE DIAGNOSES: 1. Status post hypertensive urgency. 2. Status post malignant hypertension. 3. Compensated systolic heart failure. 4. Non-insulin-dependent diabetes mellitus. 5. Nonischemic cardiomyopathy. 6. Acute-on-chronic kidney disease, stage II, improving slowly. 7. Chronic diabetic foot ulcer, stable. 8. Morbid obesity. 9. Status post allergic dermatitis.  DISCHARGE HOME MEDICATIONS: 1. Enteric-coated aspirin 81 mg 1 tablet daily. 2. Carvedilol 25 mg 1 tablet twice daily. 3. Benadryl 1 tablet 3 times daily as needed. 4. Lasix 80 mg 1 tablet daily. 5. Spironolactone 25 mg twice daily. 6. Glipizide 5 mg 1 tablet daily. 7. BiDil 2 tablets 3 times daily. 8. Potassium chloride 20 mEq 1 tablet daily.  The patient has been     advised to stop ibuprofen, metolazone, prednisone, ramipril, silver     sulfadiazine, and Januvia.  DIET:  Low-salt, low-cholesterol, 1800 calories, ADA diet.  The patient has been advised to follow up with me in 1 week.  CONDITION AT DISCHARGE:  Stable.  Post heart failure instructions have been given.  The patient has been advised also to monitor weight daily.  BRIEF HISTORY AND HOSPITAL COURSE:  Mr. Verdejo is a 66 year old male with past medical history significant for  hypertensive heart disease with systolic dysfunction; history of congestive heart failure secondary to systolic dysfunction; hypertension; non-insulin-dependent diabetes mellitus; hypercholesteremia; chronic kidney disease, stage II; diabetic foot ulcer; morbid obesity.  He came to the ER by EMS complaining of vague abdominal pain associated with nausea, vomiting, headache and dizziness.  Also states, he is unable to keep anything down.  The patient was noted to have blood pressure of 220/106 and was noted to be in mild congestive heart failure.  The patient does give history of PND, orthopnea or leg swelling.  Denies any chest pain or shortness of breath.  Denies palpitation, lightheadedness, or syncope.  PAST MEDICAL HISTORY:  As above.  PAST SURGICAL HISTORY:  He had right leg fractures, has skin grafting and knee surgery in the past.  His proBNP in the ER was 13,940.  PHYSICAL EXAMINATION:  VITAL SIGNS:  When seen in the ER, his blood pressure was 193/106, pulse was 90 and regular. HEENT:  Conjunctivae was pink. NECK:  Supple.  No positive JVD.  No bruit. LUNGS:  He has decreased breath sounds at bases with bibasilar rales. CARDIOVASCULAR:  S1, S2 was normal.  There was soft systolic murmur and S3 and S4 gallop noted. ABDOMEN:  Soft.  Bowel sounds were normal.  Mild generalized tenderness. No guarding or rebound. EXTREMITIES:  No clubbing, cyanosis.  There was 3+ edema. NEUROLOGIC:  Grossly intact. SKIN:  Had erythematous  rash.  LABORATORY DATA:  His sodium was 137, potassium 3.6, BUN 22, creatinine 1.03, glucose was 278.  BNP was 13,940, repeat was 15,285.  Repeat BNP yesterday was 25,179 which is trending down.  Sodium yesterday 140, potassium 3.9, BUN 54, creatinine 1.99.  Today, BUN is 47, creatinine is trending down to 1.77.  Hemoglobin was 11.1, hematocrit 34.5, white count of 14.6 on steroids, which is being weaned off.  His blood sugar last night was 139, this morning  is 129.  His EKG showed normal sinus rhythm with minor ST-T wave changes in inferolateral leads.  BRIEF HOSPITAL COURSE:  The patient was admitted to telemetry unit.  MI was ruled out by serial enzymes and EKG.  The patient was started on IV Lasix with good diuresis with resolution of his leg swelling. Unfortunately, his renal function gradually trended up.  ACE inhibitors were discontinued and was started on BiDil, and Lasix dose was also switched to p.o. with improvement in his renal function.  The patient's leg swelling was completely resolved.  The patient has been ambulating in hallway without any problems.  Repeat 2D echo done yesterday showed marked improvement in his LV systolic function.  The patient will be discharged home on above medications and will be followed up in my office in 1 week.  Heart failure instructions have been given and also the patient has been advised to restrict fluids.  The patient will be followed up closely in my office in 1 week.     Eduardo Osier. Sharyn Lull, M.D.     [facility]/MED[facility]  D:  02/24/2012  T:  02/25/2012  Job:  [id number]

## 2012-03-09 ENCOUNTER — Encounter (HOSPITAL_COMMUNITY): Payer: Self-pay | Admitting: Family Medicine

## 2012-03-09 ENCOUNTER — Emergency Department (HOSPITAL_COMMUNITY): Payer: Medicare Other

## 2012-03-09 ENCOUNTER — Inpatient Hospital Stay (HOSPITAL_COMMUNITY)
Admission: EM | Admit: 2012-03-09 | Discharge: 2012-04-01 | DRG: 682 | Disposition: A | Discharge status: Skilled Nursing Facility | Payer: Medicare Other | Attending: Cardiology | Admitting: Cardiology

## 2012-03-09 DIAGNOSIS — L2989 Other pruritus: Secondary | ICD-10-CM | POA: Diagnosis not present

## 2012-03-09 DIAGNOSIS — E875 Hyperkalemia: Secondary | ICD-10-CM | POA: Diagnosis not present

## 2012-03-09 DIAGNOSIS — E860 Dehydration: Secondary | ICD-10-CM

## 2012-03-09 DIAGNOSIS — T50995A Adverse effect of other drugs, medicaments and biological substances, initial encounter: Secondary | ICD-10-CM | POA: Diagnosis not present

## 2012-03-09 DIAGNOSIS — A419 Sepsis, unspecified organism: Secondary | ICD-10-CM | POA: Diagnosis not present

## 2012-03-09 DIAGNOSIS — T80211A Bloodstream infection due to central venous catheter, initial encounter: Secondary | ICD-10-CM | POA: Diagnosis not present

## 2012-03-09 DIAGNOSIS — I509 Heart failure, unspecified: Secondary | ICD-10-CM | POA: Diagnosis present

## 2012-03-09 DIAGNOSIS — I9589 Other hypotension: Secondary | ICD-10-CM | POA: Diagnosis not present

## 2012-03-09 DIAGNOSIS — Z79899 Other long term (current) drug therapy: Secondary | ICD-10-CM

## 2012-03-09 DIAGNOSIS — Z87891 Personal history of nicotine dependence: Secondary | ICD-10-CM

## 2012-03-09 DIAGNOSIS — Z6835 Body mass index (BMI) 35.0-35.9, adult: Secondary | ICD-10-CM

## 2012-03-09 DIAGNOSIS — N189 Chronic kidney disease, unspecified: Secondary | ICD-10-CM

## 2012-03-09 DIAGNOSIS — I129 Hypertensive chronic kidney disease with stage 1 through stage 4 chronic kidney disease, or unspecified chronic kidney disease: Secondary | ICD-10-CM | POA: Diagnosis present

## 2012-03-09 DIAGNOSIS — Z7982 Long term (current) use of aspirin: Secondary | ICD-10-CM

## 2012-03-09 DIAGNOSIS — Y9210 Unspecified residential institution as the place of occurrence of the external cause: Secondary | ICD-10-CM | POA: Diagnosis not present

## 2012-03-09 DIAGNOSIS — Z794 Long term (current) use of insulin: Secondary | ICD-10-CM

## 2012-03-09 DIAGNOSIS — L298 Other pruritus: Secondary | ICD-10-CM | POA: Diagnosis not present

## 2012-03-09 DIAGNOSIS — N058 Unspecified nephritic syndrome with other morphologic changes: Secondary | ICD-10-CM | POA: Diagnosis not present

## 2012-03-09 DIAGNOSIS — Y849 Medical procedure, unspecified as the cause of abnormal reaction of the patient, or of later complication, without mention of misadventure at the time of the procedure: Secondary | ICD-10-CM | POA: Diagnosis not present

## 2012-03-09 DIAGNOSIS — I951 Orthostatic hypotension: Secondary | ICD-10-CM

## 2012-03-09 DIAGNOSIS — R739 Hyperglycemia, unspecified: Secondary | ICD-10-CM

## 2012-03-09 DIAGNOSIS — A4101 Sepsis due to Methicillin susceptible Staphylococcus aureus: Secondary | ICD-10-CM | POA: Diagnosis not present

## 2012-03-09 DIAGNOSIS — E785 Hyperlipidemia, unspecified: Secondary | ICD-10-CM | POA: Diagnosis present

## 2012-03-09 DIAGNOSIS — IMO0002 Reserved for concepts with insufficient information to code with codable children: Secondary | ICD-10-CM

## 2012-03-09 DIAGNOSIS — E7800 Pure hypercholesterolemia, unspecified: Secondary | ICD-10-CM | POA: Diagnosis present

## 2012-03-09 DIAGNOSIS — IMO0001 Reserved for inherently not codable concepts without codable children: Secondary | ICD-10-CM | POA: Diagnosis present

## 2012-03-09 DIAGNOSIS — I428 Other cardiomyopathies: Secondary | ICD-10-CM | POA: Diagnosis present

## 2012-03-09 DIAGNOSIS — N179 Acute kidney failure, unspecified: Principal | ICD-10-CM | POA: Diagnosis present

## 2012-03-09 DIAGNOSIS — E870 Hyperosmolality and hypernatremia: Secondary | ICD-10-CM | POA: Diagnosis not present

## 2012-03-09 DIAGNOSIS — E8809 Other disorders of plasma-protein metabolism, not elsewhere classified: Secondary | ICD-10-CM | POA: Diagnosis present

## 2012-03-09 DIAGNOSIS — E872 Acidosis, unspecified: Secondary | ICD-10-CM | POA: Diagnosis not present

## 2012-03-09 DIAGNOSIS — L538 Other specified erythematous conditions: Secondary | ICD-10-CM | POA: Diagnosis not present

## 2012-03-09 DIAGNOSIS — N184 Chronic kidney disease, stage 4 (severe): Secondary | ICD-10-CM | POA: Diagnosis present

## 2012-03-09 DIAGNOSIS — I5042 Chronic combined systolic (congestive) and diastolic (congestive) heart failure: Secondary | ICD-10-CM | POA: Diagnosis present

## 2012-03-09 LAB — POCT I-STAT 3, VENOUS BLOOD GAS (G3P V)
Bicarbonate: 25.3 mEq/L — ABNORMAL HIGH (ref 20.0–24.0)
TCO2: 27 mmol/L (ref 0–100)
pO2, Ven: 46 mmHg — ABNORMAL HIGH (ref 30.0–45.0)

## 2012-03-09 LAB — ABO/RH: ABO/RH(D): B POS

## 2012-03-09 LAB — CBC
Hemoglobin: 11.6 g/dL — ABNORMAL LOW (ref 13.0–17.0)
MCH: 26.5 pg (ref 26.0–34.0)
MCV: 82.6 fL (ref 78.0–100.0)
RBC: 4.37 MIL/uL (ref 4.22–5.81)

## 2012-03-09 LAB — URINE MICROSCOPIC-ADD ON

## 2012-03-09 LAB — GLUCOSE, CAPILLARY
Glucose-Capillary: 522 mg/dL — ABNORMAL HIGH (ref 70–99)
Glucose-Capillary: 508 mg/dL — ABNORMAL HIGH (ref 70–99)
Glucose-Capillary: 307 mg/dL — ABNORMAL HIGH (ref 70–99)
Glucose-Capillary: 328 mg/dL — ABNORMAL HIGH (ref 70–99)

## 2012-03-09 LAB — COMPREHENSIVE METABOLIC PANEL
ALT: 13 U/L (ref 0–53)
Albumin: 2.5 g/dL — ABNORMAL LOW (ref 3.5–5.2)
Alkaline Phosphatase: 103 U/L (ref 39–117)
BUN: 40 mg/dL — ABNORMAL HIGH (ref 6–23)
CO2: 23 mEq/L (ref 19–32)
Calcium: 8.4 mg/dL (ref 8.4–10.5)
Chloride: 107 mEq/L (ref 96–112)
Chloride: 114 mEq/L — ABNORMAL HIGH (ref 96–112)
GFR calc Af Amer: 34 mL/min — ABNORMAL LOW (ref >90)
GFR calc non Af Amer: 29 mL/min — ABNORMAL LOW (ref >90)
Glucose, Bld: 571 mg/dL (ref 70–99)
Glucose, Bld: 377 mg/dL — ABNORMAL HIGH (ref 70–99)
Potassium: 5 mEq/L (ref 3.5–5.1)
Sodium: 147 mEq/L — ABNORMAL HIGH (ref 135–145)
Total Bilirubin: 0.2 mg/dL — ABNORMAL LOW (ref 0.3–1.2)
Total Bilirubin: 0.2 mg/dL — ABNORMAL LOW (ref 0.3–1.2)

## 2012-03-09 LAB — TYPE AND SCREEN
ABO/RH(D): B POS
Antibody Screen: NEGATIVE

## 2012-03-09 LAB — DIFFERENTIAL
Basophils Absolute: 0.1 10*3/uL (ref 0.0–0.1)
Eosinophils Absolute: 3.7 10*3/uL — ABNORMAL HIGH (ref 0.0–0.7)
Lymphs Abs: 1.4 10*3/uL (ref 0.7–4.0)
Monocytes Absolute: 0.4 10*3/uL (ref 0.1–1.0)
Monocytes Relative: 4 % (ref 3–12)

## 2012-03-09 LAB — URINALYSIS, ROUTINE W REFLEX MICROSCOPIC
Bilirubin Urine: NEGATIVE
Hgb urine dipstick: NEGATIVE
Ketones, ur: NEGATIVE mg/dL
Nitrite: NEGATIVE
Protein, ur: NEGATIVE mg/dL
Specific Gravity, Urine: 1.025 (ref 1.005–1.030)
Urobilinogen, UA: 0.2 mg/dL (ref 0.0–1.0)

## 2012-03-09 LAB — POCT I-STAT 3, ART BLOOD GAS (G3+)
Bicarbonate: 24.4 mEq/L — ABNORMAL HIGH (ref 20.0–24.0)
Patient temperature: 98.6
TCO2: 26 mmol/L (ref 0–100)
pO2, Arterial: 71 mmHg — ABNORMAL LOW (ref 80.0–100.0)

## 2012-03-09 LAB — CBC WITH DIFFERENTIAL/PLATELET
Basophils Relative: 1 % (ref 0–1)
Eosinophils Absolute: 4.2 10*3/uL — ABNORMAL HIGH (ref 0.0–0.7)
Eosinophils Relative: 37 % — ABNORMAL HIGH (ref 0–5)
Hemoglobin: 12.9 g/dL — ABNORMAL LOW (ref 13.0–17.0)
Lymphocytes Relative: 10 % — ABNORMAL LOW (ref 12–46)
Neutrophils Relative %: 48 % (ref 43–77)
RBC: 4.76 MIL/uL (ref 4.22–5.81)
WBC: 11.3 10*3/uL — ABNORMAL HIGH (ref 4.0–10.5)

## 2012-03-09 LAB — APTT: aPTT: 33 seconds (ref 24–37)

## 2012-03-09 LAB — PROTIME-INR
INR: 1.24 (ref 0.00–1.49)
Prothrombin Time: 15.4 seconds — ABNORMAL HIGH (ref 11.6–15.2)

## 2012-03-09 LAB — TROPONIN I: Troponin I: <0.3 ng/mL (ref <0.30)

## 2012-03-09 MED ORDER — SODIUM CHLORIDE 0.9 % IJ SOLN
3.0000 mL | Freq: Two times a day (BID) | INTRAMUSCULAR | Status: DC
  Administered 2012-03-10 – 2012-03-28 (×15): 3 mL via INTRAVENOUS

## 2012-03-09 MED ORDER — SODIUM CHLORIDE 0.9 % IV SOLN
1000.0000 mL | INTRAVENOUS | Status: DC
  Administered 2012-03-09: 1000 mL via INTRAVENOUS

## 2012-03-09 MED ORDER — PANTOPRAZOLE SODIUM 40 MG PO TBEC
40.0000 mg | DELAYED_RELEASE_TABLET | Freq: Every day | ORAL | Status: DC
  Administered 2012-03-10 – 2012-04-01 (×23): 40 mg via ORAL
  Filled 2012-03-09 (×22): qty 1

## 2012-03-09 MED ORDER — SODIUM CHLORIDE 0.9 % IV SOLN
INTRAVENOUS | Status: DC
  Administered 2012-03-09: 18:00:00 via INTRAVENOUS
  Administered 2012-03-10: 75 mL/h via INTRAVENOUS
  Administered 2012-03-10: 04:00:00 via INTRAVENOUS

## 2012-03-09 MED ORDER — INSULIN ASPART 100 UNIT/ML ~~LOC~~ SOLN
0.0000 [IU] | SUBCUTANEOUS | Status: DC
  Administered 2012-03-10: 8 [IU] via SUBCUTANEOUS
  Administered 2012-03-10: 5 [IU] via SUBCUTANEOUS
  Administered 2012-03-10 – 2012-03-11 (×4): 3 [IU] via SUBCUTANEOUS
  Administered 2012-03-11: 8 [IU] via SUBCUTANEOUS
  Administered 2012-03-11: 2 [IU] via SUBCUTANEOUS
  Administered 2012-03-11 (×2): 3 [IU] via SUBCUTANEOUS
  Administered 2012-03-12: 8 [IU] via SUBCUTANEOUS
  Administered 2012-03-12: 5 [IU] via SUBCUTANEOUS
  Administered 2012-03-12: 3 [IU] via SUBCUTANEOUS
  Administered 2012-03-12: 8 [IU] via SUBCUTANEOUS
  Administered 2012-03-12: 0 [IU] via SUBCUTANEOUS
  Administered 2012-03-12: 5 [IU] via SUBCUTANEOUS
  Administered 2012-03-13 (×2): 3 [IU] via SUBCUTANEOUS
  Administered 2012-03-13 (×2): 5 [IU] via SUBCUTANEOUS
  Administered 2012-03-13: 2 [IU] via SUBCUTANEOUS
  Administered 2012-03-14 (×3): 3 [IU] via SUBCUTANEOUS
  Administered 2012-03-14: 8 [IU] via SUBCUTANEOUS
  Administered 2012-03-14 (×2): 5 [IU] via SUBCUTANEOUS
  Administered 2012-03-15: 3 [IU] via SUBCUTANEOUS
  Administered 2012-03-15: 8 [IU] via SUBCUTANEOUS
  Administered 2012-03-15 (×3): 5 [IU] via SUBCUTANEOUS
  Administered 2012-03-15: 3 [IU] via SUBCUTANEOUS
  Administered 2012-03-16 (×3): 5 [IU] via SUBCUTANEOUS
  Administered 2012-03-16 (×2): 3 [IU] via SUBCUTANEOUS
  Administered 2012-03-16: 8 [IU] via SUBCUTANEOUS
  Administered 2012-03-17: 1 [IU] via SUBCUTANEOUS
  Administered 2012-03-17: 8 [IU] via SUBCUTANEOUS
  Administered 2012-03-17: 5 [IU] via SUBCUTANEOUS
  Administered 2012-03-17 – 2012-03-18 (×2): 2 [IU] via SUBCUTANEOUS
  Administered 2012-03-18: 3 [IU] via SUBCUTANEOUS
  Administered 2012-03-18: 2 [IU] via SUBCUTANEOUS
  Administered 2012-03-18: 3 [IU] via SUBCUTANEOUS
  Administered 2012-03-18: 5 [IU] via SUBCUTANEOUS
  Administered 2012-03-19 (×2): 3 [IU] via SUBCUTANEOUS
  Administered 2012-03-19: 5 [IU] via SUBCUTANEOUS
  Administered 2012-03-19 – 2012-03-20 (×2): 2 [IU] via SUBCUTANEOUS
  Administered 2012-03-20: 5 [IU] via SUBCUTANEOUS
  Administered 2012-03-20: 3 [IU] via SUBCUTANEOUS
  Administered 2012-03-20 (×3): 5 [IU] via SUBCUTANEOUS
  Administered 2012-03-21 (×2): 3 [IU] via SUBCUTANEOUS
  Administered 2012-03-21 (×2): 2 [IU] via SUBCUTANEOUS
  Administered 2012-03-21: 3 [IU] via SUBCUTANEOUS
  Administered 2012-03-22: 2 [IU] via SUBCUTANEOUS
  Administered 2012-03-22: 3 [IU] via SUBCUTANEOUS
  Administered 2012-03-22 – 2012-03-23 (×4): 2 [IU] via SUBCUTANEOUS

## 2012-03-09 MED ORDER — ONDANSETRON HCL 4 MG/2ML IJ SOLN: 4.0000 mg | Freq: Four times a day (QID) | INTRAMUSCULAR | Status: DC | PRN

## 2012-03-09 MED ORDER — ALUM & MAG HYDROXIDE-SIMETH 200-200-20 MG/5ML PO SUSP: 30.0000 mL | Freq: Four times a day (QID) | ORAL | Status: DC | PRN

## 2012-03-09 MED ORDER — ONDANSETRON HCL 4 MG PO TABS: 4.0000 mg | ORAL_TABLET | Freq: Four times a day (QID) | ORAL | Status: DC | PRN

## 2012-03-09 MED ORDER — INSULIN GLARGINE 100 UNIT/ML ~~LOC~~ SOLN
10.0000 [IU] | Freq: Every day | SUBCUTANEOUS | Status: DC
  Administered 2012-03-09 – 2012-03-25 (×17): 10 [IU] via SUBCUTANEOUS

## 2012-03-09 MED ORDER — CARVEDILOL 25 MG PO TABS
25.0000 mg | ORAL_TABLET | Freq: Two times a day (BID) | ORAL | Status: DC
  Administered 2012-03-10 – 2012-03-11 (×3): 25 mg via ORAL
  Filled 2012-03-09 (×5): qty 1

## 2012-03-09 MED ORDER — GLIPIZIDE 10 MG PO TABS
10.0000 mg | ORAL_TABLET | Freq: Two times a day (BID) | ORAL | Status: DC
  Administered 2012-03-10 – 2012-03-11 (×3): 10 mg via ORAL
  Filled 2012-03-09 (×5): qty 1

## 2012-03-09 MED ORDER — ASPIRIN EC 81 MG PO TBEC: 81.0000 mg | DELAYED_RELEASE_TABLET | Freq: Every day | ORAL | Status: DC

## 2012-03-09 MED ORDER — ASPIRIN EC 81 MG PO TBEC
81.0000 mg | DELAYED_RELEASE_TABLET | Freq: Every day | ORAL | Status: DC
  Administered 2012-03-09 – 2012-04-01 (×24): 81 mg via ORAL
  Filled 2012-03-09 (×24): qty 1

## 2012-03-09 MED ORDER — INSULIN REGULAR HUMAN 100 UNIT/ML IJ SOLN
INTRAMUSCULAR | Status: DC
  Administered 2012-03-09: 4.7 [IU]/h via INTRAVENOUS
  Filled 2012-03-09: qty 1

## 2012-03-09 MED ORDER — HEPARIN SODIUM (PORCINE) 5000 UNIT/ML IJ SOLN
5000.0000 [IU] | Freq: Three times a day (TID) | INTRAMUSCULAR | Status: DC
  Administered 2012-03-09 – 2012-04-01 (×69): 5000 [IU] via SUBCUTANEOUS
  Filled 2012-03-09 (×74): qty 1

## 2012-03-09 MED ORDER — ISOSORB DINITRATE-HYDRALAZINE 20-37.5 MG PO TABS
1.0000 | ORAL_TABLET | Freq: Three times a day (TID) | ORAL | Status: DC
  Administered 2012-03-09 – 2012-03-15 (×16): 1 via ORAL
  Filled 2012-03-09 (×23): qty 1

## 2012-03-09 MED ORDER — SODIUM CHLORIDE 0.9 % IV BOLUS (SEPSIS)
1000.0000 mL | Freq: Once | INTRAVENOUS | Status: AC
  Administered 2012-03-09: 1000 mL via INTRAVENOUS

## 2012-03-09 NOTE — ED Notes (Signed)
Notified RN of CBG [phone]

## 2012-03-09 NOTE — H&P (Signed)
[name] is an 66 y.o. male.   Chief Complaint: Uncontrolled diabetes mellitus/poor appetite/nausea vomiting diarrhea HPI: Patient is 66 year old male with a surgical history significant for multiple medical problems i.e. hypertension, systolic heart failure in the past, non-insulin-dependent diabetes matters, nonischemic cardiomyopathy, chronic kidney disease stage II, peripheral vascular disease, morbid obesity, came to the ER complaining off poor appetite nausea vomiting diarrhea and blood sugar staying above 500 despite increasing glipizide for last week 4 days. Patient denies any chest pain shortness of breath abdominal pain. Denies any fever chills. Denies any urinary complaints . Patient was noted to be orthostatic with blood pressure in 80s but repeat blood pressure has been normal with no orthostatic changes. Patient also was noted to be mildly dehydrated with elevated creatinine patient denies any palpitation lightheadedness or syncope. Denies PND orthopnea leg swelling. Patient was recently discharged from the hospital treated for decompensated systolic heart failure approximately 2 weeks ago.  Past Medical History  Diagnosis Date  . Diabetes mellitus   . Hypertension   . Hyperlipidemia   . CHF (congestive heart failure)   . Enlarged heart     Past Surgical History  Procedure Date  . Right leg fracture   . Skin graft   . Knee surgery     History reviewed. No pertinent family history. Social History:  reports that he has never smoked. He does not have any smokeless tobacco history on file. He reports that he does not drink alcohol or use illicit drugs.  Allergies: No Known Allergies   (Not in a hospital admission)  Results for orders placed during the hospital encounter of 03/09/12 (from the past 48 hour(s))  GLUCOSE, CAPILLARY     Status: Abnormal   Collection Time   03/09/12 12:04 PM      Component Value Range Comment   Glucose-Capillary 522 (*) 70 - 99 mg/dL   CBC  WITH DIFFERENTIAL     Status: Abnormal   Collection Time   03/09/12 12:14 PM      Component Value Range Comment   WBC 11.3 (*) 4.0 - 10.5 K/uL    RBC 4.76  4.22 - 5.81 MIL/uL    Hemoglobin 12.9 (*) 13.0 - 17.0 g/dL    HCT 16.1  09.6 - 04.5 %    MCV 83.6  78.0 - 100.0 fL    MCH 27.1  26.0 - 34.0 pg    MCHC 32.4  30.0 - 36.0 g/dL    RDW 40.9  81.1 - 91.4 %    Platelets 169  150 - 400 K/uL    Neutrophils Relative 48  43 - 77 %    Lymphocytes Relative 10 (*) 12 - 46 %    Monocytes Relative 4  3 - 12 %    Eosinophils Relative 37 (*) 0 - 5 %    Basophils Relative 1  0 - 1 %    Neutro Abs 5.4  1.7 - 7.7 K/uL    Lymphs Abs 1.1  0.7 - 4.0 K/uL    Monocytes Absolute 0.5  0.1 - 1.0 K/uL    Eosinophils Absolute 4.2 (*) 0.0 - 0.7 K/uL    Basophils Absolute 0.1  0.0 - 0.1 K/uL   COMPREHENSIVE METABOLIC PANEL     Status: Abnormal   Collection Time   03/09/12 12:14 PM      Component Value Range Comment   Sodium 142  135 - 145 mEq/L    Potassium 5.0  3.5 - 5.1 mEq/L  Chloride 107  96 - 112 mEq/L    CO2 22  19 - 32 mEq/L    Glucose, Bld 571 (*) 70 - 99 mg/dL    BUN 40 (*) 6 - 23 mg/dL    Creatinine, Ser 1.61 (*) 0.50 - 1.35 mg/dL    Calcium 8.7  8.4 - 09.6 mg/dL    Total Protein 7.3  6.0 - 8.3 g/dL    Albumin 2.5 (*) 3.5 - 5.2 g/dL    AST 18  0 - 37 U/L    ALT 13  0 - 53 U/L    Alkaline Phosphatase 103  39 - 117 U/L    Total Bilirubin 0.2 (*) 0.3 - 1.2 mg/dL    GFR calc non Af Amer 29 (*) >90 mL/min    GFR calc Af Amer 34 (*) >90 mL/min   PROTIME-INR     Status: Abnormal   Collection Time   03/09/12 12:14 PM      Component Value Range Comment   Prothrombin Time 15.4 (*) 11.6 - 15.2 seconds    INR 1.24  0.00 - 1.49   APTT     Status: Normal   Collection Time   03/09/12 12:14 PM      Component Value Range Comment   aPTT 33  24 - 37 seconds   TROPONIN I     Status: Normal   Collection Time   03/09/12 12:23 PM      Component Value Range Comment   Troponin I <0.30  <0.30 ng/mL   TYPE  AND SCREEN     Status: Normal   Collection Time   03/09/12 12:42 PM      Component Value Range Comment   ABO/RH(D) B POS      Antibody Screen NEG      Sample Expiration 03/12/2012     AMMONIA     Status: Normal   Collection Time   03/09/12 12:43 PM      Component Value Range Comment   Ammonia 25  11 - 60 umol/L   POCT I-STAT 3, BLOOD GAS (G3+)     Status: Abnormal   Collection Time   03/09/12  2:12 PM      Component Value Range Comment   pH, Arterial 7.391  7.350 - 7.450    pCO2 arterial 40.3  35.0 - 45.0 mmHg    pO2, Arterial 71.0 (*) 80.0 - 100.0 mmHg    Bicarbonate 24.4 (*) 20.0 - 24.0 mEq/L    TCO2 26  0 - 100 mmol/L    O2 Saturation 94.0      Patient temperature 98.6 F      Collection site RADIAL, ALLEN'S TEST ACCEPTABLE      Drawn by Operator      Sample type ARTERIAL     POCT I-STAT 3, BLOOD GAS (G3P V)     Status: Abnormal   Collection Time   03/09/12  2:32 PM      Component Value Range Comment   pH, Ven 7.366 (*) 7.250 - 7.300    pCO2, Ven 44.2 (*) 45.0 - 50.0 mmHg    pO2, Ven 46.0 (*) 30.0 - 45.0 mmHg    Bicarbonate 25.3 (*) 20.0 - 24.0 mEq/L    TCO2 27  0 - 100 mmol/L    O2 Saturation 80.0      Sample type VENOUS      Dg Chest Port 1 View  03/09/2012  *RADIOLOGY REPORT*  Clinical Data: Hyperglycemia, dehydration, hypertension,  diabetes  PORTABLE CHEST - 1 VIEW  Comparison: 02/19/2012  Findings: Low volume exam with accentuation of the cardiac size and vascular markings.  Basilar atelectasis evident.  Negative for CHF or definite pneumonia.  No large effusion or pneumothorax.  IMPRESSION: Low volume exam with atelectasis   Original Report Authenticated By: Judie Petit. Ruel Favors, M.D.     Review of Systems  Constitutional: Negative for fever and chills.  Respiratory: Negative for cough, hemoptysis, sputum production and shortness of breath.   Cardiovascular: Negative for chest pain, palpitations, orthopnea, claudication and leg swelling.  Gastrointestinal: Positive for  nausea, vomiting and diarrhea. Negative for abdominal pain.  Genitourinary: Negative for dysuria and urgency.  Neurological: Positive for dizziness. Negative for headaches.    Blood pressure 161/87, pulse 87, temperature 98.3 F (36.8 C), resp. rate 18, SpO2 98.00%. Physical Exam  Constitutional: He is oriented to person, place, and time.  HENT:  Head: Normocephalic and atraumatic.  Eyes: Conjunctivae normal are normal. Pupils are equal, round, and reactive to light. Left eye exhibits no discharge. No scleral icterus.  Neck: Normal range of motion. Neck supple. No JVD present. No tracheal deviation present. No thyromegaly present.  Cardiovascular: Normal rate and regular rhythm.   Murmur (Soft systolic murmur noted no S3 gallop) heard. Respiratory: Effort normal and breath sounds normal. No respiratory distress. He has no wheezes. He has no rales.  GI: Soft. Bowel sounds are normal. He exhibits no distension. There is no tenderness. There is no rebound.  Musculoskeletal: He exhibits no edema and no tenderness.  Neurological: He is alert and oriented to person, place, and time.     Assessment/Plan Uncontrolled diabetes mellitus Dehydration Acute on chronic renal insufficiency Diarrhea rule out antibiotic associated colitis  Hypertension Hypercholesteremia  acute on chronic kidney disease stage IV Nonischemic cardiomyopathy in Compensated systolic heart failure Morbid obesity Plan As per orders  Asuzena Weis N 03/09/2012, 2:54 PM

## 2012-03-09 NOTE — ED Notes (Signed)
Patient's wife states patient has just been released from the hospital from an extended period of time for elevated blood sugars and hypertension. Patient is slow to respond to questions asked. Wife speaks for him. She states he has not had any oral intake since Saturday due to lack of appetite and today he couldn't hold water down.

## 2012-03-09 NOTE — ED Notes (Signed)
MD at bedside. 

## 2012-03-09 NOTE — ED Notes (Signed)
Per wife, pt has not been eating since Friday and is very weak. sts he is a diabetic and CBG has been in the 400s. Pt very lethargic at triage. BP low.

## 2012-03-09 NOTE — ED Provider Notes (Signed)
History     CSN: [id number] Arrival date & time 03/09/12  1143 First MD Initiated Contact with Patient 03/09/12 1155     Chief Complaint  Patient presents with  . Hyperglycemia  . Dehydration   HPI Pt was released from the hospital two weeks ago.  At that time he was being treated for a CHF exacerbation.  He also had elevated BP at that time.  Since last Friday he has been having trouble with high blood sugar, poor appetite and decreased po intake.  Pt has also been feeling weak.  He feels like he is dehydrated and the symptoms have been slowly worsening. He called his doctor and was told to come to the hospital.  No fever, or coughing.  No vomiting, but he has had diarrhea.  Pt does not want to eat because he can't stand the smell of it.  No pain.  He did have his medications adjusted during the previous hospitalization.  Past Medical History  Diagnosis Date  . Diabetes mellitus   . Hypertension   . Hyperlipidemia   . CHF (congestive heart failure)   . Enlarged heart     Past Surgical History  Procedure Date  . Right leg fracture   . Skin graft   . Knee surgery     History reviewed. No pertinent family history.  History  Substance Use Topics  . Smoking status: Never Smoker   . Smokeless tobacco: Not on file  . Alcohol Use: No      Review of Systems  Constitutional: Negative for fever.  Cardiovascular: Negative for chest pain.  Gastrointestinal: Negative for abdominal pain.  Genitourinary: Negative for dysuria.  Neurological: Negative for headaches.  All other systems reviewed and are negative.    Allergies  Review of patient's allergies indicates no known allergies.  Home Medications   Current Outpatient Rx  Name Route Sig Dispense Refill  . ASPIRIN 81 MG PO TBEC Oral Take 1 tablet (81 mg total) by mouth daily. 30 tablet 3  . CARVEDILOL 25 MG PO TABS Oral Take 1 tablet (25 mg total) by mouth 2 (two) times daily with a meal. 60 tablet 3  .  DIPHENHYDRAMINE-ZINC ACETATE 2-0.1 % EX CREA Topical Apply 1 application topically 3 (three) times daily as needed. As needed for rash.    . FUROSEMIDE 80 MG PO TABS Oral Take 1 tablet (80 mg total) by mouth daily. 30 tablet 3  . GLIPIZIDE ER 5 MG PO TB24 Oral Take 1 tablet (5 mg total) by mouth daily with breakfast. 30 tablet 3  . ISOSORB DINITRATE-HYDRALAZINE 20-37.5 MG PO TABS Oral Take 2 tablets by mouth 3 (three) times daily. 180 tablet 3  . POTASSIUM CHLORIDE CRYS ER 20 MEQ PO TBCR Oral Take 1 tablet (20 mEq total) by mouth daily. 30 tablet 3  . SPIRONOLACTONE 25 MG PO TABS Oral Take 1 tablet (25 mg total) by mouth 2 (two) times daily. 60 tablet 3    BP 146/73  Pulse 81  Temp 98.3 F (36.8 C)  Resp 18  SpO2 98%  Physical Exam  Nursing note and vitals reviewed. Constitutional: He appears well-developed and well-nourished. No distress.  HENT:  Head: Normocephalic and atraumatic.  Right Ear: External ear normal.  Left Ear: External ear normal.  Mouth/Throat: No oropharyngeal exudate (dry mm).  Eyes: Conjunctivae normal are normal. Right eye exhibits no discharge. Left eye exhibits no discharge. No scleral icterus.  Neck: Neck supple. No tracheal deviation present.  Cardiovascular:  Normal rate, regular rhythm and intact distal pulses.   Pulmonary/Chest: Effort normal and breath sounds normal. No stridor. No respiratory distress. He has no wheezes. He has no rales.  Abdominal: Soft. Bowel sounds are normal. He exhibits no distension. There is no tenderness. There is no rebound and no guarding.  Musculoskeletal: He exhibits no edema and no tenderness.  Neurological: He is alert. He is not disoriented. No sensory deficit. Cranial nerve deficit:  no gross defecits noted. He exhibits normal muscle tone. He displays no seizure activity. Coordination normal. GCS eye subscore is 4. GCS verbal subscore is 5. GCS motor subscore is 6.       General weakness, equal in all extremities  Skin: Skin  is warm and dry. No rash noted.       Dry scaling skin  Psychiatric: He has a normal mood and affect.    ED Course  Procedures (including critical care time) EKG Rate 80, nl axis, nl intervals SINUS RHYTHM ~ normal P axis, V-rate 50- 99 PROBABLE LEFT ATRIAL ABNORMALITY ~ P >30mS, <-0.3mV V1 ABNORMAL T, CONSIDER ISCHEMIA, DIFFUSE LEADS ~ T <-0.59mV, ant/lat/inf t wave changes are more prominent and new in some leads since last tracing Labs Reviewed  GLUCOSE, CAPILLARY - Abnormal; Notable for the following:    Glucose-Capillary 522 (*)     All other components within normal limits  CBC WITH DIFFERENTIAL - Abnormal; Notable for the following:    WBC 11.3 (*)     Hemoglobin 12.9 (*)     Lymphocytes Relative 10 (*)     Eosinophils Relative 37 (*)     Eosinophils Absolute 4.2 (*)     All other components within normal limits  COMPREHENSIVE METABOLIC PANEL - Abnormal; Notable for the following:    Glucose, Bld 571 (*)     BUN 40 (*)     Creatinine, Ser 2.20 (*)     Albumin 2.5 (*)     Total Bilirubin 0.2 (*)     GFR calc non Af Amer 29 (*)     GFR calc Af Amer 34 (*)     All other components within normal limits  PROTIME-INR - Abnormal; Notable for the following:    Prothrombin Time 15.4 (*)     All other components within normal limits  POCT I-STAT 3, BLOOD GAS (G3+) - Abnormal; Notable for the following:    pO2, Arterial 71.0 (*)     Bicarbonate 24.4 (*)     All other components within normal limits  POCT I-STAT 3, BLOOD GAS (G3P V) - Abnormal; Notable for the following:    pH, Ven 7.366 (*)     pCO2, Ven 44.2 (*)     pO2, Ven 46.0 (*)     Bicarbonate 25.3 (*)     All other components within normal limits  APTT  TYPE AND SCREEN  AMMONIA  TROPONIN I  URINALYSIS, ROUTINE W REFLEX MICROSCOPIC  ABO/RH  CLOSTRIDIUM DIFFICILE BY PCR   Dg Chest Port 1 View  03/09/2012  *RADIOLOGY REPORT*  Clinical Data: Hyperglycemia, dehydration, hypertension, diabetes  PORTABLE CHEST - 1  VIEW  Comparison: 02/19/2012  Findings: Low volume exam with accentuation of the cardiac size and vascular markings.  Basilar atelectasis evident.  Negative for CHF or definite pneumonia.  No large effusion or pneumothorax.  IMPRESSION: Low volume exam with atelectasis   Original Report Authenticated By: Judie Petit. Ruel Favors, M.D.      1. Dehydration   2. Orthostatic hypotension  3. Hyperglycemia without ketosis   4. Acute on chronic renal insufficiency       MDM  Pt presents with hyperglycemia and dehydration and worsening renal insufficiency.   He has been having diarrhea which is likely contributing.  UA pending to check for UTI.  No sign of CHF at this time.  Pt would benefit from blood sugar management, IV fluids.       Celene Kras, MD 03/09/12 1434

## 2012-03-10 ENCOUNTER — Encounter (HOSPITAL_COMMUNITY): Payer: Self-pay | Admitting: General Practice

## 2012-03-10 LAB — CBC
HCT: 36.5 % — ABNORMAL LOW (ref 39.0–52.0)
MCH: 26.7 pg (ref 26.0–34.0)
MCV: 82.6 fL (ref 78.0–100.0)
RBC: 4.42 MIL/uL (ref 4.22–5.81)
RDW: 15.4 % (ref 11.5–15.5)
WBC: 10.9 10*3/uL — ABNORMAL HIGH (ref 4.0–10.5)

## 2012-03-10 LAB — GLUCOSE, CAPILLARY
Glucose-Capillary: 173 mg/dL — ABNORMAL HIGH (ref 70–99)
Glucose-Capillary: 258 mg/dL — ABNORMAL HIGH (ref 70–99)
Glucose-Capillary: 290 mg/dL — ABNORMAL HIGH (ref 70–99)

## 2012-03-10 LAB — BASIC METABOLIC PANEL
BUN: 49 mg/dL — ABNORMAL HIGH (ref 6–23)
Calcium: 8.5 mg/dL (ref 8.4–10.5)
Creatinine, Ser: 2.63 mg/dL — ABNORMAL HIGH (ref 0.50–1.35)
GFR calc Af Amer: 27 mL/min — ABNORMAL LOW (ref >90)
GFR calc non Af Amer: 24 mL/min — ABNORMAL LOW (ref >90)
Glucose, Bld: 196 mg/dL — ABNORMAL HIGH (ref 70–99)
Potassium: 4.7 mEq/L (ref 3.5–5.1)

## 2012-03-10 LAB — HEMOGLOBIN A1C: Mean Plasma Glucose: 255 mg/dL — ABNORMAL HIGH (ref <117)

## 2012-03-10 MED ORDER — DIPHENHYDRAMINE HCL 25 MG PO CAPS
25.0000 mg | ORAL_CAPSULE | Freq: Three times a day (TID) | ORAL | Status: DC | PRN
  Administered 2012-03-11 – 2012-03-28 (×7): 25 mg via ORAL
  Filled 2012-03-10 (×7): qty 1

## 2012-03-10 NOTE — Progress Notes (Signed)
Utilization Review Completed.  

## 2012-03-10 NOTE — Progress Notes (Signed)
Subjective:  Patient's appetite over states has not received any IV fluids since last night IV infiltrated. Denies any chest pain or shortness of breath  Objective:  Vital Signs in the last 24 hours: Temp:  [97.6 F (36.4 C)-97.9 F (36.6 C)] 97.9 F (36.6 C) (10/03 1026) Pulse Rate:  [77-87] 83  (10/03 1026) Resp:  [13-21] 20  (10/03 1026) BP: (96-161)/(61-87) 96/61 mmHg (10/03 1026) SpO2:  [96 %-100 %] 96 % (10/03 1026) Weight:  [103.3 kg (227 lb 11.8 oz)-104.3 kg (229 lb 15 oz)] 104.3 kg (229 lb 15 oz) (10/03 0504)  Intake/Output from previous day: 10/02 0701 - 10/03 0700 In: 340 [P.O.:240; I.V.:100] Out: -  Intake/Output from this shift: Total I/O In: 360 [P.O.:360] Out: -   Physical Exam: Neck: no adenopathy, no carotid bruit, no JVD and supple, symmetrical, trachea midline Lungs: clear to auscultation bilaterally Heart: regular rate and rhythm, S1, S2 normal and Soft systolic murmur noted Abdomen: soft, non-tender; bowel sounds normal; no masses,  no organomegaly Extremities: extremities normal, atraumatic, no cyanosis or edema  Lab Results:  Basename 03/10/12 0803 03/09/12 1754  WBC 10.9* 10.6*  HGB 11.8* 11.6*  PLT 166 155    Basename 03/10/12 0803 03/09/12 1754  NA 150* 147*  K 4.7 4.2  CL 115* 114*  CO2 25 23  GLUCOSE 196* 377*  BUN 49* 43*  CREATININE 2.63* 2.22*    Basename 03/09/12 1223  TROPONINI <0.30   Hepatic Function Panel  Basename 03/09/12 1754  PROT 6.5  ALBUMIN 2.2*  AST 18  ALT 13  ALKPHOS 92  BILITOT 0.2*  BILIDIR --  IBILI --   No results found for this basename: CHOL in the last 72 hours No results found for this basename: PROTIME in the last 72 hours  Imaging: Imaging results have been reviewed and Dg Chest Port 1 View  03/09/2012  *RADIOLOGY REPORT*  Clinical Data: Hyperglycemia, dehydration, hypertension, diabetes  PORTABLE CHEST - 1 VIEW  Comparison: 02/19/2012  Findings: Low volume exam with accentuation of the  cardiac size and vascular markings.  Basilar atelectasis evident.  Negative for CHF or definite pneumonia.  No large effusion or pneumothorax.  IMPRESSION: Low volume exam with atelectasis   Original Report Authenticated By: Judie Petit. Ruel Favors, M.D.     Cardiac Studies:  Assessment/Plan:  Uncontrolled diabetes mellitus  Dehydration  Acute on chronic renal insufficiency  Diarrhea rule out antibiotic associated colitis  Hypertension  Hypercholesteremia  acute on chronic kidney disease stage IV  Nonischemic cardiomyopathy  Compensated systolic heart failure  Morbid obesity Plan Restart IV as per orders Check labs in a.m.  OT PT consult   LOS: 1 day     Charis Juliana N 03/10/2012, 1:15 PM

## 2012-03-10 NOTE — Progress Notes (Signed)
Inpatient Diabetes Program Recommendations  AACE/ADA: New Consensus Statement on Inpatient Glycemic Control (2013)  Target Ranges:  Prepandial:   less than 140 mg/dL      Peak postprandial:   less than 180 mg/dL (1-2 hours)      Critically ill patients:  140 - 180 mg/dL   Reason for Visit: Hyperglycemia  Pt was released from the hospital two weeks ago. At that time he was being treated for a CHF exacerbation. He also had elevated BP at that time.  Since last Friday he has been having trouble with high blood sugar, poor appetite and decreased po intake. Pt has also been feeling weak. He feels like he is dehydrated and the symptoms have been slowly worsening. He called his doctor and was told to come to the hospital.  No fever, or coughing. No vomiting, but he has had diarrhea. Pt does not want to eat because he can't stand the smell of it.  No pain. He did have his medications adjusted during the previous hospitalization. Pt was discharged on glipizide 10 mg bid.  Had previously been on Levemir 22 units QAM that was prescribed by PCP.  States blood sugars were controlled when on insulin.  PCP office had given pt some samples of the Levemir pen at previous visit, but wife states it had expired.  Results for KAYLUM, SHRUM (MRN [id number]) as of 03/10/2012 14:24  Ref. Range 03/10/2012 08:03  Sodium Latest Range: 135-145 mEq/L 150 (H)  Potassium Latest Range: 3.5-5.1 mEq/L 4.7  Chloride Latest Range: 96-112 mEq/L 115 (H)  CO2 Latest Range: 19-32 mEq/L 25  BUN Latest Range: 6-23 mg/dL 49 (H)  Creatinine Latest Range: 0.50-1.35 mg/dL 3.08 (H)  Calcium Latest Range: 8.4-10.5 mg/dL 8.5  GFR calc non Af Amer Latest Range: >90 mL/min 24 (L)  GFR calc Af Amer Latest Range: >90 mL/min 27 (L)  Glucose Latest Range: 70-99 mg/dL 657 (H)   Results for KANEN, MOTTOLA (MRN [id number]) as of 03/10/2012 14:24  Ref. Range 03/09/2012 16:40  Hemoglobin A1C Latest Range: <5.7 % 10.5 (H)    Recommendations:  Pt will need basal insulin when ready for discharge.  Previously on Levemir 22 units QAM and reported no hypoglycemia at home.  Will continue to follow.  Ailene Ards, RD, LDN, CDE Inpatient Diabetes Coordinator [phone]

## 2012-03-11 ENCOUNTER — Inpatient Hospital Stay (HOSPITAL_COMMUNITY): Payer: Medicare Other

## 2012-03-11 LAB — CBC
MCH: 26 pg (ref 26.0–34.0)
MCHC: 31.7 g/dL (ref 30.0–36.0)
Platelets: 142 10*3/uL — ABNORMAL LOW (ref 150–400)
RDW: 15.5 % (ref 11.5–15.5)

## 2012-03-11 LAB — GLUCOSE, CAPILLARY
Glucose-Capillary: 105 mg/dL — ABNORMAL HIGH (ref 70–99)
Glucose-Capillary: 175 mg/dL — ABNORMAL HIGH (ref 70–99)
Glucose-Capillary: 264 mg/dL — ABNORMAL HIGH (ref 70–99)

## 2012-03-11 LAB — BASIC METABOLIC PANEL
Calcium: 8.3 mg/dL — ABNORMAL LOW (ref 8.4–10.5)
GFR calc Af Amer: 21 mL/min — ABNORMAL LOW (ref >90)
GFR calc non Af Amer: 18 mL/min — ABNORMAL LOW (ref >90)
Sodium: 147 mEq/L — ABNORMAL HIGH (ref 135–145)

## 2012-03-11 MED ORDER — ACETAMINOPHEN 325 MG PO TABS
650.0000 mg | ORAL_TABLET | Freq: Four times a day (QID) | ORAL | Status: DC | PRN
Start: 2012-03-11 — End: 2012-04-01
  Administered 2012-03-13 – 2012-03-25 (×3): 650 mg via ORAL
  Filled 2012-03-11 (×3): qty 2

## 2012-03-11 MED ORDER — CARVEDILOL 12.5 MG PO TABS
12.5000 mg | ORAL_TABLET | Freq: Two times a day (BID) | ORAL | Status: DC
  Administered 2012-03-11 – 2012-03-14 (×6): 12.5 mg via ORAL
  Filled 2012-03-11 (×8): qty 1

## 2012-03-11 MED ORDER — GLIPIZIDE 5 MG PO TABS
5.0000 mg | ORAL_TABLET | Freq: Two times a day (BID) | ORAL | Status: DC
  Filled 2012-03-11 (×2): qty 1

## 2012-03-11 NOTE — Progress Notes (Signed)
Cosign for [employer] RN's assessment, med administration, note(s), I/O and care plan/education.  Lorretta Harp RN

## 2012-03-11 NOTE — Progress Notes (Signed)
Occupational Therapy Note Chart reviewed. Wife present and states pt very sleepy this am and she didn't think he would participate. Attempted to wake up pt but he is sleepy and only opening eyes briefly. Wife states she got him up to the chair yesterday as well as to the bathroom. Will try back at a later time. Judithann Sauger OTR/L [phone] 03/11/2012

## 2012-03-11 NOTE — Progress Notes (Signed)
Patient is being transported to radiology for procedure.  D. Junie Engram RN 

## 2012-03-11 NOTE — Progress Notes (Signed)
Subjective:  Appetite remains poor her IV infiltrated no IV access. Creatinine gradually trending up had episode of hypotension.  Objective:  Vital Signs in the last 24 hours: Temp:  [97.6 F (36.4 C)-99.1 F (37.3 C)] 99.1 F (37.3 C) (10/04 0922) Pulse Rate:  [65-104] 89  (10/04 0922) Resp:  [20] 20  (10/04 0922) BP: (92-109)/(53-73) 109/65 mmHg (10/04 0922) SpO2:  [92 %-98 %] 98 % (10/04 0439) Weight:  [104.4 kg (230 lb 2.6 oz)] 104.4 kg (230 lb 2.6 oz) (10/04 0439)  Intake/Output from previous day: 10/03 0701 - 10/04 0700 In: 480 [P.O.:480] Out: 300 [Urine:300] Intake/Output from this shift: Total I/O In: 120 [P.O.:120] Out: 100 [Urine:100]  Physical Exam: Neck: no adenopathy, no carotid bruit, no JVD and supple, symmetrical, trachea midline Lungs: clear to auscultation bilaterally Heart: regular rate and rhythm, S1, S2 normal and Soft systolic murmur noted Abdomen: soft, non-tender; bowel sounds normal; no masses,  no organomegaly Extremities: extremities normal, atraumatic, no cyanosis or edema  Lab Results:  Basename 03/11/12 0530 03/10/12 0803  WBC 10.9* 10.9*  HGB 10.9* 11.8*  PLT 142* 166    Basename 03/11/12 0530 03/10/12 0803  NA 147* 150*  K 4.3 4.7  CL 114* 115*  CO2 24 25  GLUCOSE 145* 196*  BUN 59* 49*  CREATININE 3.30* 2.63*    Basename 03/09/12 1223  TROPONINI <0.30   Hepatic Function Panel  Basename 03/09/12 1754  PROT 6.5  ALBUMIN 2.2*  AST 18  ALT 13  ALKPHOS 92  BILITOT 0.2*  BILIDIR --  IBILI --   No results found for this basename: CHOL in the last 72 hours No results found for this basename: PROTIME in the last 72 hours  Imaging: Imaging results have been reviewed and Dg Chest Port 1 View  03/09/2012  *RADIOLOGY REPORT*  Clinical Data: Hyperglycemia, dehydration, hypertension, diabetes  PORTABLE CHEST - 1 VIEW  Comparison: 02/19/2012  Findings: Low volume exam with accentuation of the cardiac size and vascular markings.   Basilar atelectasis evident.  Negative for CHF or definite pneumonia.  No large effusion or pneumothorax.  IMPRESSION: Low volume exam with atelectasis   Original Report Authenticated By: Judie Petit. Ruel Favors, M.D.     Cardiac Studies:  Assessment/Plan:  Acute on chronic renal insufficiency stage IV multifactorial Status post hypotension secondary to medication  dehydration Diabetes mellitus Hypertension Nonischemic cardiomyopathy Compensated systolic heart failure Morbid obesity Status post diarrhea Plan Reduce Coreg to 12.5 mg twice daily Hold glipizide in view of worsening renal function Restart IV fluids Check labs in a.m. Dr. Algie Coffer on-call for weekend  LOS: 2 days    Robynn Pane 03/11/2012, 12:28 PM

## 2012-03-11 NOTE — Procedures (Signed)
RIJV PICC 14 cm SVC RA

## 2012-03-11 NOTE — Progress Notes (Signed)
Patient has returned to the unit; will continue to monitor patient. D. Idania Desouza RN  

## 2012-03-11 NOTE — Progress Notes (Signed)
PT Cancellation Note  Treatment cancelled today due to patient's refusal to participate.    INGOLD,Sorren Vallier 03/11/2012, 8:43 AM  [facility] [phone] (pager)

## 2012-03-12 LAB — CBC
MCH: 25.9 pg — ABNORMAL LOW (ref 26.0–34.0)
MCHC: 31.6 g/dL (ref 30.0–36.0)
MCV: 82 fL (ref 78.0–100.0)
Platelets: 135 10*3/uL — ABNORMAL LOW (ref 150–400)

## 2012-03-12 LAB — BASIC METABOLIC PANEL
BUN: 67 mg/dL — ABNORMAL HIGH (ref 6–23)
CO2: 22 mEq/L (ref 19–32)
Calcium: 8.2 mg/dL — ABNORMAL LOW (ref 8.4–10.5)
GFR calc non Af Amer: 16 mL/min — ABNORMAL LOW (ref >90)
Glucose, Bld: 109 mg/dL — ABNORMAL HIGH (ref 70–99)

## 2012-03-12 LAB — GLUCOSE, CAPILLARY
Glucose-Capillary: 185 mg/dL — ABNORMAL HIGH (ref 70–99)
Glucose-Capillary: 268 mg/dL — ABNORMAL HIGH (ref 70–99)

## 2012-03-12 MED ORDER — DEXTROSE-NACL 5-0.2 % IV SOLN
INTRAVENOUS | Status: DC
  Administered 2012-03-12: 1000 mL via INTRAVENOUS
  Administered 2012-03-13 – 2012-03-16 (×5): via INTRAVENOUS
  Filled 2012-03-12: qty 1000

## 2012-03-12 MED ORDER — ALBUMIN HUMAN 5 % IV SOLN
12.5000 g | Freq: Once | INTRAVENOUS | Status: AC
  Administered 2012-03-12: 12.5 g via INTRAVENOUS
  Filled 2012-03-12: qty 250

## 2012-03-12 NOTE — Evaluation (Signed)
Physical Therapy Evaluation Patient Details Name: [name] MRN: [id number] DOB: May 16, 1946 Today's Date: 03/12/2012 Time: 5784-6962 PT Time Calculation (min): 24 min  PT Assessment / Plan / Recommendation Clinical Impression  Pt is a 66 y/o male admitted with n/v/d along with the below PT problem list. Pt would benefit from acute PT to maximize independence and facilitate d/c home with HHPT.    PT Assessment  Patient needs continued PT services    Follow Up Recommendations  Home health PT    Does the patient have the potential to tolerate intense rehabilitation      Barriers to Discharge None      Equipment Recommendations  None recommended by PT    Recommendations for Other Services     Frequency Min 3X/week    Precautions / Restrictions Precautions Precautions: Fall Restrictions Weight Bearing Restrictions: No   Pertinent Vitals/Pain None      Mobility  Bed Mobility Bed Mobility: Supine to Sit Supine to Sit: 4: Min assist Details for Bed Mobility Assistance: Assist for trunk to translate anterior over BOS. Cues for sequence. Transfers Transfers: Sit to Stand;Stand to Sit Sit to Stand: 4: Min assist;With upper extremity assist;From bed Stand to Sit: 4: Min assist;With upper extremity assist;To chair/3-in-1 Details for Transfer Assistance: Assist for balance and due to functional weakness of bilateral LEs. Cues for safest hand placement and slow descent to chair. Ambulation/Gait Ambulation/Gait Assistance: 4: Min assist Ambulation Distance (Feet): 120 Feet Assistive device: None (Pt, however, furniture walking with bilateral UEs.) Ambulation/Gait Assistance Details: Assist for balance and due to functional weakness in bilateral LEs. Pt furniture walking with bilateral UEs to increase safety/balance. Cues for sequence and tall posture. Gait Pattern: Step-through pattern;Decreased stride length;Right flexed knee in stance;Left flexed knee in stance;Trunk  flexed;Wide base of support Stairs: No Wheelchair Mobility Wheelchair Mobility: No           PT Diagnosis: Difficulty walking;Generalized weakness  PT Problem List: Decreased strength;Decreased activity tolerance;Decreased balance;Decreased mobility;Decreased knowledge of use of DME PT Treatment Interventions: DME instruction;Gait training;Functional mobility training;Therapeutic activities;Balance training;Patient/family education   PT Goals Acute Rehab PT Goals PT Goal Formulation: With patient Time For Goal Achievement: 03/19/12 Potential to Achieve Goals: Good Pt will go Supine/Side to Sit: with modified independence PT Goal: Supine/Side to Sit - Progress: Goal set today Pt will go Sit to Supine/Side: with modified independence PT Goal: Sit to Supine/Side - Progress: Goal set today Pt will go Sit to Stand: with modified independence PT Goal: Sit to Stand - Progress: Goal set today Pt will go Stand to Sit: with modified independence PT Goal: Stand to Sit - Progress: Goal set today Pt will Ambulate: >150 feet;with modified independence;with least restrictive assistive device PT Goal: Ambulate - Progress: Goal set today  Visit Information  Last PT Received On: 03/12/12 Assistance Needed: +1    Subjective Data  Subjective: "That would be fine.' Patient Stated Goal: Go home.   Prior Functioning  Home Living Lives With: Spouse Available Help at Discharge: Family;Available 24 hours/day Type of Home: House Home Access: Level entry Home Layout: One level Home Adaptive Equipment: Straight cane;Walker - rolling Prior Function Level of Independence: Independent with assistive device(s) (Occassionally uses cane.) Able to Take Stairs?: Yes Driving: Yes Vocation: Retired Musician: No difficulties    Cognition  Overall Cognitive Status: Appears within functional limits for tasks assessed/performed Arousal/Alertness: Awake/alert Orientation Level: Appears  intact for tasks assessed Behavior During Session: [facility] for tasks performed    Extremity/Trunk  Assessment Right Upper Extremity Assessment RUE ROM/Strength/Tone: Within functional levels RUE Sensation: WFL - Light Touch RUE Coordination: WFL - gross/fine motor Left Upper Extremity Assessment LUE ROM/Strength/Tone: Within functional levels LUE Sensation: WFL - Light Touch LUE Coordination: WFL - gross/fine motor Right Lower Extremity Assessment RLE ROM/Strength/Tone: Within functional levels RLE Sensation: WFL - Light Touch RLE Coordination: WFL - gross motor Left Lower Extremity Assessment LLE ROM/Strength/Tone: Within functional levels LLE Sensation: WFL - Light Touch LLE Coordination: WFL - gross motor Trunk Assessment Trunk Assessment: Normal   Balance Balance Balance Assessed: No  End of Session PT - End of Session Equipment Utilized During Treatment: Gait belt Activity Tolerance: Patient tolerated treatment well Patient left: in chair;with call bell/phone within reach Nurse Communication: Mobility status  GP     Cephus Shelling 03/12/2012, 12:19 PM  03/12/2012 Cephus Shelling, PT, DPT [phone]

## 2012-03-12 NOTE — Progress Notes (Signed)
Subjective:  Feels dry. No chest pain. T max 100.1 F  Objective:  Vital Signs in the last 24 hours: Temp:  [97.8 F (36.6 C)-100.1 F (37.8 C)] 100.1 F (37.8 C) (10/05 0500) Pulse Rate:  [81-94] 94  (10/05 0500) Cardiac Rhythm:  [-] Normal sinus rhythm (10/05 1020) Resp:  [18-20] 18  (10/05 0500) BP: (92-127)/(40-65) 127/65 mmHg (10/05 0500) SpO2:  [95 %-97 %] 95 % (10/05 0500) Weight:  [107.2 kg (236 lb 5.3 oz)] 107.2 kg (236 lb 5.3 oz) (10/05 0500)  Physical Exam: BP Readings from Last 1 Encounters:  03/12/12 127/65    Wt Readings from Last 1 Encounters:  03/12/12 107.2 kg (236 lb 5.3 oz)    Weight change: 2.8 kg (6 lb 2.8 oz)  HEENT: /AT, Eyes-Brown, PERL, EOMI, Conjunctiva-Pale pink, Sclera-Non-icteric Neck: No JVD, No bruit, Trachea midline. Lungs:  Clear, Bilateral. Cardiac:  Regular rhythm, normal S1 and S2, no S3.  Abdomen:  Soft, non-tender. Extremities:  1-2 + edema present. No cyanosis. No clubbing. CNS: AxOx3, Cranial nerves grossly intact, moves all 4 extremities. Right handed. Skin: Warm and dry.   Intake/Output from previous day: 10/04 0701 - 10/05 0700 In: 2085 [P.O.:360; I.V.:1725] Out: 300 [Urine:300]    Lab Results: BMET    Component Value Date/Time   NA 150* 03/12/2012 0500   K 4.0 03/12/2012 0500   CL 118* 03/12/2012 0500   CO2 22 03/12/2012 0500   GLUCOSE 109* 03/12/2012 0500   BUN 67* 03/12/2012 0500   CREATININE 3.65* 03/12/2012 0500   CALCIUM 8.2* 03/12/2012 0500   GFRNONAA 16* 03/12/2012 0500   GFRAA 18* 03/12/2012 0500   CBC    Component Value Date/Time   WBC 10.0 03/12/2012 0500   RBC 4.01* 03/12/2012 0500   HGB 10.4* 03/12/2012 0500   HCT 32.9* 03/12/2012 0500   PLT 135* 03/12/2012 0500   MCV 82.0 03/12/2012 0500   MCH 25.9* 03/12/2012 0500   MCHC 31.6 03/12/2012 0500   RDW 15.8* 03/12/2012 0500   LYMPHSABS 1.4 03/09/2012 1754   MONOABS 0.4 03/09/2012 1754   EOSABS 3.7* 03/09/2012 1754   BASOSABS 0.1 03/09/2012 1754   CARDIAC  ENZYMES Lab Results  Component Value Date   CKTOTAL 108 08/04/2011   CKMB 5.1* 08/04/2011   TROPONINI <0.30 03/09/2012    Assessment/Plan:  Patient Active Hospital Problem List:  Acute on chronic renal insufficiency stage IV multifactorial  Status post hypotension secondary to medication  dehydration  Diabetes mellitus  Hypertension  Nonischemic cardiomyopathy  Compensated systolic heart failure  Morbid obesity  Status post diarrhea Hypoalbuminemia  Change IV fluid and add albumin   LOS: 3 days    Orpah Cobb  MD  03/12/2012, 12:21 PM

## 2012-03-13 LAB — CBC
Hemoglobin: 10.4 g/dL — ABNORMAL LOW (ref 13.0–17.0)
MCHC: 32.6 g/dL (ref 30.0–36.0)
Platelets: 122 10*3/uL — ABNORMAL LOW (ref 150–400)
RBC: 3.86 MIL/uL — ABNORMAL LOW (ref 4.22–5.81)

## 2012-03-13 LAB — GLUCOSE, CAPILLARY
Glucose-Capillary: 181 mg/dL — ABNORMAL HIGH (ref 70–99)
Glucose-Capillary: 195 mg/dL — ABNORMAL HIGH (ref 70–99)
Glucose-Capillary: 286 mg/dL — ABNORMAL HIGH (ref 70–99)
Glucose-Capillary: 229 mg/dL — ABNORMAL HIGH (ref 70–99)

## 2012-03-13 LAB — COMPREHENSIVE METABOLIC PANEL
ALT: 77 U/L — ABNORMAL HIGH (ref 0–53)
AST: 135 U/L — ABNORMAL HIGH (ref 0–37)
Alkaline Phosphatase: 208 U/L — ABNORMAL HIGH (ref 39–117)
CO2: 22 mEq/L (ref 19–32)
Calcium: 8 mg/dL — ABNORMAL LOW (ref 8.4–10.5)
GFR calc Af Amer: 19 mL/min — ABNORMAL LOW (ref >90)
Glucose, Bld: 184 mg/dL — ABNORMAL HIGH (ref 70–99)
Potassium: 3.9 mEq/L (ref 3.5–5.1)
Sodium: 148 mEq/L — ABNORMAL HIGH (ref 135–145)
Total Protein: 6.3 g/dL (ref 6.0–8.3)

## 2012-03-13 MED ORDER — DEXTROSE 5 % IV SOLN
1.0000 g | Freq: Every day | INTRAVENOUS | Status: DC
  Administered 2012-03-14 – 2012-03-15 (×3): 1 g via INTRAVENOUS
  Filled 2012-03-13 (×5): qty 10

## 2012-03-13 MED ORDER — ALBUMIN HUMAN 5 % IV SOLN
12.5000 g | Freq: Once | INTRAVENOUS | Status: AC
  Administered 2012-03-13: 12.5 g via INTRAVENOUS
  Filled 2012-03-13: qty 250

## 2012-03-13 NOTE — Progress Notes (Signed)
OT Cancellation Note  Patient Details Name: Gyasi Hazzard MRN: [id number] DOB: 03-24-1946   Cancelled Treatment:    Reason Eval/Treat Not Completed: Fatigue/lethargy limiting ability to participate (pt refusal). Explained OT role and purpose, but pt continued to refuse. Reports he will try tomorrow.  03/13/2012 Cipriano Mile OTR/L Pager [phone] Office [phone]

## 2012-03-13 NOTE — Progress Notes (Signed)
Called for patient with elevated temp after 650 mg tylenol given earlier. BP 95/53, HR 94, RR 20, 95% RA, patient lethargic but arousable. Wife concerned about patient's condition. MD notified by bedside RN. Orders received for blood cultures and Rocephin. Will continue to monitor, advised bedside RN to call if needed

## 2012-03-13 NOTE — Progress Notes (Signed)
Pt temp 100.9, given tylenol 650 mg. Temp rechecked= 102.1. BP=95/53. Wife states that he is just not acting right. Rapid response assessed. MD notified, will continue to monitor.

## 2012-03-14 LAB — COMPREHENSIVE METABOLIC PANEL
ALT: 92 U/L — ABNORMAL HIGH (ref 0–53)
AST: 142 U/L — ABNORMAL HIGH (ref 0–37)
Albumin: 2.1 g/dL — ABNORMAL LOW (ref 3.5–5.2)
CO2: 20 mEq/L (ref 19–32)
Calcium: 7.9 mg/dL — ABNORMAL LOW (ref 8.4–10.5)
GFR calc non Af Amer: 16 mL/min — ABNORMAL LOW (ref >90)
Sodium: 147 mEq/L — ABNORMAL HIGH (ref 135–145)

## 2012-03-14 LAB — BASIC METABOLIC PANEL
CO2: 19 mEq/L (ref 19–32)
Chloride: 119 mEq/L — ABNORMAL HIGH (ref 96–112)
Creatinine, Ser: 3.49 mg/dL — ABNORMAL HIGH (ref 0.50–1.35)
GFR calc Af Amer: 19 mL/min — ABNORMAL LOW (ref >90)
Potassium: 3.8 mEq/L (ref 3.5–5.1)

## 2012-03-14 LAB — GLUCOSE, CAPILLARY
Glucose-Capillary: 165 mg/dL — ABNORMAL HIGH (ref 70–99)
Glucose-Capillary: 169 mg/dL — ABNORMAL HIGH (ref 70–99)

## 2012-03-14 MED ORDER — CARVEDILOL 3.125 MG PO TABS
3.1250 mg | ORAL_TABLET | Freq: Two times a day (BID) | ORAL | Status: DC
  Administered 2012-03-15 (×2): 3.125 mg via ORAL
  Filled 2012-03-14 (×7): qty 1

## 2012-03-14 MED ORDER — ALBUMIN HUMAN 25 % IV SOLN
50.0000 g | Freq: Once | INTRAVENOUS | Status: AC
  Administered 2012-03-14: 50 g via INTRAVENOUS
  Filled 2012-03-14: qty 200

## 2012-03-14 NOTE — Clinical Documentation Improvement (Signed)
Abnormal Labs Clarification  THIS DOCUMENT IS NOT A PERMANENT PART OF THE MEDICAL RECORD  TO RESPOND TO THE THIS QUERY, FOLLOW THE INSTRUCTIONS BELOW:  1. If needed, update documentation for the patient's encounter via the notes activity.  2. Access this query again and click edit on the [employer].  3. After updating, or not, click F2 to complete all highlighted (required) fields concerning your review. Select "additional documentation in the medical record" OR "no additional documentation provided".  4. Click Sign note button.  5. The deficiency will fall out of your InBasket *Please let us know if you are not able to complete this workflow by phone or e-mail (listed below).  Please update your documentation within the medical record to reflect your response to this query.                                                                                   03/14/12  Dear Dr. [facility]  In a better effort to capture your patient's severity of illness, reflect appropriate length of stay and utilization of resources, a review of the medical record has revealed the following indicators.    Based on your clinical judgment, please clarify and document in a progress note and/or discharge summary the clinical condition associated with the following supporting information:  In responding to this query please exercise your independent judgment.  The fact that a query is asked, does not imply that any particular answer is desired or expected.  Abnormal findings (laboratory, x-ray, pathologic, and other diagnostic results) are not coded and reported unless the physician indicates their clinical significance.   The medical record reflects the following clinical findings, please clarify the diagnostic and/or clinical significance:    Potential Diagnosis  Hypernatremia  Other condition  Unable to determine   Supporting Information: Risk Factors:  "Acute on chronic renal  insuffiency", "Nausea, vomiting & diarrhea" and "Dehydration" per notes  Signs/Symptoms: "Feel dry" per notes  Results for NICK, STULTS (MRN [id number]) as of 03/14/2012 13:19  Ref. Range 03/09/2012 17:54 03/10/2012 08:03 03/11/2012 05:30 03/12/2012 05:00 03/13/2012 05:20 03/14/2012 05:50  Sodium Latest Range: 135-145 mEq/L 147 (H) 150 (H) 147 (H) 150 (H) 148 (H) 149 (H)   Treatment: Monitoring labs D5&0.2NS@75ml /hr-continuous    Reviewed: Query answered  Thank You,   Rossie Muskrat RN, BSN  Clinical Documentation Specialist Pager:  [phone]  [email or web address]  Health Information Management [place]

## 2012-03-14 NOTE — Clinical Documentation Improvement (Signed)
GENERIC DOCUMENTATION CLARIFICATION QUERY  THIS DOCUMENT IS NOT A PERMANENT PART OF THE MEDICAL RECORD  TO RESPOND TO THE THIS QUERY, FOLLOW THE INSTRUCTIONS BELOW:  1. If needed, update documentation for the patient's encounter via the notes activity.  2. Access this query again and click edit on the In [employer].  3. After updating, or not, click F2 to complete all highlighted (required) fields concerning your review. Select "additional documentation in the medical record" OR "no additional documentation provided".  4. Click Sign note button.  5. The deficiency will fall out of your In Basket *Please let us know if you are not able to complete this workflow by phone or e-mail (listed below).  Please update your documentation within the medical record to reflect your response to this query.                                                                                        03/14/12   Dear Dr. Orpah Cobb [facility],  In a better effort to capture your patient's severity of illness, reflect appropriate length of stay and utilization of resources, a review of the patient medical record has revealed the following indicators.    Based on your clinical judgment, please clarify and document in a progress note and/or discharge summary the clinical condition associated with the following supporting information:  In responding to this query please exercise your independent judgment.  The fact that a query is asked, does not imply that any particular answer is desired or expected.  Patient is being treated with Rocephin per [facility].  Please document the possible, probable or suspected condition that is being treated.  Supporting Information: Signs & Symptoms: "Fever, lethargic," per nurses notes Intermittent Hypotension  Diagnostics: Blood Culture: Pending  Treatment Rocephin 1gm IVPB daily Monitoring and assessment of patient  You may use possible, probable, or suspect with  inpatient documentation. possible, probable, suspected diagnoses MUST be documented at the time of discharge  Reviewed: Query not answered directly Thank You,    Rossie Muskrat RN, BSN  Clinical Documentation Specialist Pager:  [phone] [email or web address]  Health Information Management [place]

## 2012-03-14 NOTE — Clinical Documentation Improvement (Signed)
RENAL FAILURE DOCUMENTATION CLARIFICATION QUERY  THIS DOCUMENT IS NOT A PERMANENT PART OF THE MEDICAL RECORD  TO RESPOND TO THE THIS QUERY, FOLLOW THE INSTRUCTIONS BELOW:  1. If needed, update documentation for the patient's encounter via the notes activity.  2. Access this query again and click edit on the In [employer].  3. After updating, or not, click F2 to complete all highlighted (required) fields concerning your review. Select "additional documentation in the medical record" OR "no additional documentation provided".  4. Click Sign note button.  5. The deficiency will fall out of your In Basket *Please let us know if you are not able to complete this workflow by phone or e-mail (listed below).  Please update your documentation within the medical record to reflect your response to this query.                                                                                    03/14/12  Dear Dr. Orpah [facility]  In a better effort to capture your patient's severity of illness, reflect appropriate length of stay and utilization of resources, a review of the patient medical record has revealed the following indicators.    Based on your clinical judgment, please clarify and document in a progress note and/or discharge summary the clinical condition associated with the following supporting information:  In responding to this query please exercise your independent judgment.  The fact that a query is asked, does not imply that any particular answer is desired or expected.  "Acute on chronic renal insufficiency stage IV multifactorial" has been documented.  For clarity and better specificity please document a more specific condition in the progress notes and discharge summary.   Possible Clinical Conditions  Acute Renal Failure on Chronic Kidney Disease Stage IV  Acute on Chronic Renal Failure,  Other Condition  Cannot Clinically Determine                   You may  use possible, probable, or suspect with inpatient documentation. possible, probable, suspected diagnoses MUST be documented at the time of discharge  Reviewed: Query answered by Nephrology  Thank You,    Rossie Muskrat RN, BSN  Clinical Documentation Specialist Pager:  [phone] [email or web address]  Health Information Management [place]

## 2012-03-14 NOTE — Progress Notes (Signed)
Physical Therapy Progress Note   03/14/12 1300  PT Visit Information  Last PT Received On 03/14/12  Assistance Needed +1  PT Time Calculation  PT Start Time 1154  PT Stop Time 1217  PT Time Calculation (min) 23 min  Subjective Data  Subjective People in here make your temperature run up.  Patient Stated Goal Get better to go home  Precautions  Precautions Fall  Restrictions  Weight Bearing Restrictions No  Cognition  Overall Cognitive Status Appears within functional limits for tasks assessed/performed  Arousal/Alertness Awake/alert  Orientation Level Appears intact for tasks assessed  Behavior During Session [facility] for tasks performed  Bed Mobility  Bed Mobility Not assessed  Transfers  Transfers Sit to Stand;Stand to Sit  Sit to Stand 1: +2 Total assist;With upper extremity assist;From chair/3-in-1  Sit to Stand: Patient Percentage 50%  Stand to Sit 3: Mod assist;With upper extremity assist;To chair/3-in-1  Details for Transfer Assistance (A) with initiating mvt due to chair being low to ground.  (A) with slowing descent and balance.  Cues for hand placement, safety and foot placement.  Ambulation/Gait  Ambulation/Gait Assistance 4: Min assist  Ambulation Distance (Feet) 65 Feet  Assistive device Rolling walker  Ambulation/Gait Assistance Details (A) with RW placement and balance.  Cues for posture, proper stepping sequence, and safety  Gait Pattern Step-through pattern;Decreased stride length;Shuffle  Gait velocity decreased  Stairs No  Wheelchair Mobility  Wheelchair Mobility No  Balance  Balance Assessed No  PT - End of Session  Equipment Utilized During Treatment Gait belt  Activity Tolerance Patient limited by fatigue  Patient left in chair;with call bell/phone within reach;with family/visitor present  Nurse Communication Mobility status  PT - Assessment/Plan  Comments on Treatment Session Pt was very fatigued and unable to transfer from chair to standing without  +2 (A).  Family and pt educated on laying sheets in their lift chair to make transfer easier for him.   PT Plan Discharge plan remains appropriate;Frequency remains appropriate  PT Frequency Min 3X/week  Recommendations for Other Services Other (comment) (None)  Follow Up Recommendations Home health PT;Supervision/Assistance - 24 hour; HH aid  Equipment Recommended 3 in 1 bedside comode;Hospital bed  Acute Rehab PT Goals  PT Goal Formulation With patient  Time For Goal Achievement 03/19/12  Potential to Achieve Goals Good  Pt will go Sit to Stand with modified independence  PT Goal: Sit to Stand - Progress Not met  Pt will go Stand to Sit with modified independence  PT Goal: Stand to Sit - Progress Not met  Pt will Ambulate >150 feet;with modified independence;with least restrictive assistive device  PT Goal: Ambulate - Progress Progressing toward goal    Pt did not report any pain just fatigue.  Amy DiTommaso, SPT  [place], [place] DPT [phone]

## 2012-03-14 NOTE — Progress Notes (Signed)
Subjective:  Denies any chest pain or shortness of breath. Denies any urinary complaints denies any cough denies abdominal pain. Spiked to 101 last night started on Rocephin empirically states feels slightly better  Objective:  Vital Signs in the last 24 hours: Temp:  [98.3 F (36.8 C)-102.1 F (38.9 C)] 98.3 F (36.8 C) (10/07 1057) Pulse Rate:  [77-94] 77  (10/07 1057) Resp:  [20] 20  (10/07 1057) BP: (95-112)/(53-63) 103/63 mmHg (10/07 1057) SpO2:  [95 %-98 %] 98 % (10/07 1057) Weight:  [106.1 kg (233 lb 14.5 oz)] 106.1 kg (233 lb 14.5 oz) (10/07 0522)  Intake/Output from previous day: 10/06 0701 - 10/07 0700 In: 195 [P.O.:195] Out: 350 [Urine:350] Intake/Output from this shift: Total I/O In: 465 [P.O.:240; I.V.:225] Out: 50 [Urine:50]  Physical Exam: Neck: no adenopathy, no carotid bruit, no JVD and supple, symmetrical, trachea midline Lungs: Decreased breath sound at bases Heart: regular rate and rhythm, S1, S2 normal, no murmur, click, rub or gallop Abdomen: soft, non-tender; bowel sounds normal; no masses,  no organomegaly Extremities: extremities normal, atraumatic, no cyanosis or edema  Lab Results:  Basename 03/13/12 0520 03/12/12 0500  WBC 9.2 10.0  HGB 10.4* 10.4*  PLT 122* 135*    Basename 03/14/12 0550 03/13/12 0520  NA 149* 148*  K 3.8 3.9  CL 119* 115*  CO2 19 22  GLUCOSE 179* 184*  BUN 69* 71*  CREATININE 3.49* 3.55*   No results found for this basename: TROPONINI:2,CK,MB:2 in the last 72 hours Hepatic Function Panel  Basename 03/13/12 0520  PROT 6.3  ALBUMIN 2.2*  AST 135*  ALT 77*  ALKPHOS 208*  BILITOT 0.4  BILIDIR --  IBILI --   No results found for this basename: CHOL in the last 72 hours No results found for this basename: PROTIME in the last 72 hours  Imaging: Imaging results have been reviewed and No results found.  Cardiac Studies:  Assessment/Plan: Acute on chronic renal insufficiency stage IV multifactorial  Status post  hypotension secondary to medication  dehydration  Diabetes mellitus  Hypertension  Nonischemic cardiomyopathy  Compensated systolic heart failure  Morbid obesity  Status post diarrhea  Hypoalbuminemia Plan  Reduce Coreg to 12.5 mg twice daily  Hold glipizide in view of worsening renal function  Restart IV fluids Add albumin as per orders Check labs in a.m.   LOS: 5 days    Duward Allbritton N 03/14/2012, 2:30 PM

## 2012-03-14 NOTE — Progress Notes (Signed)
Occupational Therapy Evaluation Patient Details Name: [name] MRN: [id number] DOB: 03-Oct-1945 Today's Date: 03/14/2012 Time: 4098-1191 OT Time Calculation (min): 33 min  OT Assessment / Plan / Recommendation Clinical Impression  66 yo with recent admission due to n/v/d. Pt with stage IV ESR disease, DM, Hypotension. Pt will benefit from skilled OT services due to below deficits to max independence with ADL and functional mobility for ADL to facilitate D/C to next venue.    OT Assessment  Patient needs continued OT Services    Follow Up Recommendations  Skilled nursing facility;Home health OT;Other (comment) (pending progress)    Barriers to Discharge None    Equipment Recommendations  3 in 1 bedside comode   Recommendations for Other Services    Frequency  Min 2X/week    Precautions / Restrictions Precautions Precautions: Fall Restrictions Weight Bearing Restrictions: No   Pertinent Vitals/Pain No c/o pain    ADL  Eating/Feeding: Simulated;Independent Where Assessed - Eating/Feeding: Chair Grooming: Simulated;Set up Where Assessed - Grooming: Supported sitting Upper Body Bathing: Simulated;Set up Where Assessed - Upper Body Bathing: Supported sitting Lower Body Bathing: Simulated;Maximal assistance Where Assessed - Lower Body Bathing: Supported sit to stand Upper Body Dressing: Simulated;Set up Where Assessed - Upper Body Dressing: Unsupported sitting Lower Body Dressing: Simulated;Maximal assistance Where Assessed - Lower Body Dressing: Supported sit to stand Toilet Transfer: Simulated;Moderate assistance Toilet Transfer Method: Sit to stand;Stand pivot Toilet Transfer Equipment: Other (comment) (bed - chair) Toileting - Clothing Manipulation and Hygiene: Simulated;Moderate assistance Where Assessed - Toileting Clothing Manipulation and Hygiene: Standing Equipment Used: Gait belt;Rolling walker Transfers/Ambulation Related to ADLs: Mod A. RW level. Pt's wife  states recent falls due to BLE weakness. ADL Comments: Limited by weakness     OT Diagnosis: Generalized weakness  OT Problem List: Decreased strength;Decreased activity tolerance;Impaired balance (sitting and/or standing);Decreased safety awareness;Decreased knowledge of use of DME or AE;Decreased knowledge of precautions;Impaired sensation;Obesity OT Treatment Interventions: Self-care/ADL training;Therapeutic exercise;Energy conservation;DME and/or AE instruction;Therapeutic activities;Patient/family education;Balance training   OT Goals Acute Rehab OT Goals OT Goal Formulation: With patient Time For Goal Achievement: 03/28/12 Potential to Achieve Goals: Good ADL Goals Pt Will Perform Grooming: with supervision;Standing at sink;Unsupported;with cueing (comment type and amount) ADL Goal: Grooming - Progress: Goal set today Pt Will Perform Upper Body Bathing: Standing at sink;with supervision;Unsupported;with cueing (comment type and amount) ADL Goal: Upper Body Bathing - Progress: Goal set today Pt Will Perform Lower Body Bathing: with min assist;Sit to stand from chair;Unsupported;with adaptive equipment;with cueing (comment type and amount) ADL Goal: Lower Body Bathing - Progress: Goal set today Pt Will Perform Lower Body Dressing: with min assist;Unsupported;with adaptive equipment;with cueing (comment type and amount);Sit to stand from chair ADL Goal: Lower Body Dressing - Progress: Goal set today Pt Will Transfer to Toilet: with supervision;3-in-1;Ambulation;with cueing (comment type and amount);with DME ADL Goal: Toilet Transfer - Progress: Goal set today Pt Will Perform Toileting - Clothing Manipulation: with supervision;Standing;with cueing (comment type and amount) ADL Goal: Toileting - Clothing Manipulation - Progress: Goal set today Pt Will Perform Toileting - Hygiene: with supervision;Sit to stand from 3-in-1/toilet;Standing at 3-in-1/toilet ADL Goal: Toileting - Hygiene -  Progress: Goal set today Pt Will Perform Tub/Shower Transfer: Shower transfer;with caregiver independent in assisting;with min assist;with DME;Ambulation;Shower seat with back ADL Goal: Web designer - Progress: Goal set today Arm Goals Pt Will Complete Theraband Exer: with supervision, verbal cues required/provided;to increase strength;Bilateral upper extremities;2 sets;Level 2 Theraband Arm Goal: Theraband Exercises - Progress: Goal set today  Visit  Information  Last OT Received On: 03/14/12 Assistance Needed: +1    Subjective Data      Prior Functioning     Home Living Lives With: Spouse Available Help at Discharge: Family;Available 24 hours/day Type of Home: House Home Access: Level entry Home Layout: One level Bathroom Shower/Tub: Tub/shower unit;Walk-in shower;Door [employer] Toilet: Standard Bathroom Accessibility: Yes How Accessible: Accessible via walker Home Adaptive Equipment: Straight cane;Walker - rolling Prior Function Level of Independence: Independent with assistive device(s) (Occassionally uses cane.) Able to Take Stairs?: Yes Driving: Yes Vocation: Retired Musician: No difficulties Dominant Hand: Right         Vision/Perception     Cognition  Overall Cognitive Status: Appears within functional limits for tasks assessed/performed Arousal/Alertness: Awake/alert Orientation Level: Appears intact for tasks assessed Behavior During Session: [facility] for tasks performed    Extremity/Trunk Assessment Right Upper Extremity Assessment RUE ROM/Strength/Tone: WFL for tasks assessed RUE Sensation: WFL - Light Touch;WFL - Proprioception RUE Coordination: WFL - gross/fine motor Left Upper Extremity Assessment LUE ROM/Strength/Tone: WFL for tasks assessed LUE Sensation: WFL - Light Touch;WFL - Proprioception LUE Coordination: WFL - gross/fine motor Right Lower Extremity Assessment RLE ROM/Strength/Tone: Deficits (overall weakness) RLE  Sensation: History of peripheral neuropathy Left Lower Extremity Assessment LLE ROM/Strength/Tone: Deficits (overall weakness) LLE Sensation: History of peripheral neuropathy Trunk Assessment Trunk Assessment: Normal     Mobility Bed Mobility Bed Mobility: Not assessed Sit to Supine: 4: Min assist (A to lift BLE) Transfers Transfers: Sit to Stand;Stand to Sit Sit to Stand: 1: +2 Total assist;With upper extremity assist;From chair/3-in-1 Sit to Stand: Patient Percentage: 50% Stand to Sit: 3: Mod assist;With upper extremity assist;To chair/3-in-1 Details for Transfer Assistance: (A) with initiating mvt due to chair being low to ground.  (A) with slowing descent and balance.  Cues for hand placement, safety and foot placement.     Shoulder Instructions     Exercise  BUE general strengthening - theraband level II   Balance Balance Balance Assessed: No   End of Session OT - End of Session Equipment Utilized During Treatment: Gait belt Activity Tolerance: Patient limited by fatigue Patient left: in bed;with call bell/phone within reach;with family/visitor present Nurse Communication: Mobility status  GO     Jerrico Covello,HILLARY 03/14/2012, 2:48 PM [name], OTR/L  [phone] 03/14/2012

## 2012-03-15 LAB — GLUCOSE, CAPILLARY
Glucose-Capillary: 190 mg/dL — ABNORMAL HIGH (ref 70–99)
Glucose-Capillary: 192 mg/dL — ABNORMAL HIGH (ref 70–99)
Glucose-Capillary: 209 mg/dL — ABNORMAL HIGH (ref 70–99)

## 2012-03-15 LAB — CBC
HCT: 29.9 % — ABNORMAL LOW (ref 39.0–52.0)
Hemoglobin: 9.8 g/dL — ABNORMAL LOW (ref 13.0–17.0)
MCV: 81 fL (ref 78.0–100.0)
RBC: 3.69 MIL/uL — ABNORMAL LOW (ref 4.22–5.81)
WBC: 10.9 10*3/uL — ABNORMAL HIGH (ref 4.0–10.5)

## 2012-03-15 MED ORDER — VANCOMYCIN HCL 1000 MG IV SOLR: 1250.0000 mg | INTRAVENOUS | Status: DC

## 2012-03-15 MED ORDER — VANCOMYCIN HCL 1000 MG IV SOLR
2000.0000 mg | Freq: Once | INTRAVENOUS | Status: AC
  Administered 2012-03-15: 2000 mg via INTRAVENOUS
  Filled 2012-03-15: qty 2000

## 2012-03-15 NOTE — Consult Note (Signed)
WOC consult Note Reason for Consult: Consult requested for sacrum/gluteal cleft Wound type: Partial thickness fissure Pressure Ulcer POA: This wound is NOT a pressure ulcer and IS present on admission Measurement: 3X.2X.1cm Wound bed:100% beefy red Drainage (amount, consistency, odor) Small yellow drainage Periwound: Intact skin surrounding Dressing procedure/placement/frequency: Wife at bedside.  Discussed moisture in skin folds as the etiology of a fissure.  She states he has the same problem under abd skin fold.  Demonstrated use of barrier cream to protect and promote healing and decrease discomfort and discussed use of antifungal powder if desired. Will not plan to follow further unless re-consulted.  [id number] RN, MSN, [employer]  [phone]

## 2012-03-15 NOTE — Progress Notes (Signed)
ANTIBIOTIC CONSULT NOTE - INITIAL  Pharmacy Consult for Vancomycin Indication: bacteremia  No Known Allergies  Patient Measurements: Height: 5\' 11"  (180.3 cm) Weight: 233 lb 14.5 oz (106.1 kg) IBW/kg (Calculated) : 75.3    Vital Signs: Temp: 98.3 F (36.8 C) (10/07 2100) Temp src: Oral (10/07 2100) BP: 121/56 mmHg (10/07 2100) Pulse Rate: 81  (10/07 2100) Intake/Output from previous day: 10/07 0701 - 10/08 0700 In: 1616.8 [P.O.:600; I.V.:816.8; IV Piggyback:200] Out: 300 [Urine:300] Intake/Output from this shift: Total I/O In: 3 [I.V.:3] Out: 250 [Urine:250]  Labs:  [facility] 03/14/12 1436 03/14/12 0550 03/13/12 0520 03/12/12 0500  WBC -- -- 9.2 10.0  HGB -- -- 10.4* 10.4*  PLT -- -- 122* 135*  LABCREA -- -- -- --  CREATININE 3.62* 3.49* 3.55* --   Estimated Creatinine Clearance: 24.5 ml/min (by C-G formula based on Cr of 3.62). No results found for this basename: VANCOTROUGH:2,VANCOPEAK:2,VANCORANDOM:2,GENTTROUGH:2,GENTPEAK:2,GENTRANDOM:2,TOBRATROUGH:2,TOBRAPEAK:2,TOBRARND:2,AMIKACINPEAK:2,AMIKACINTROU:2,AMIKACIN:2, in the last 72 hours   Microbiology: Recent Results (from the past 720 hour(s))  MRSA PCR SCREENING     Status: Normal   Collection Time   02/19/12  2:42 PM      Component Value Range Status Comment   MRSA by PCR NEGATIVE  NEGATIVE Final   CLOSTRIDIUM DIFFICILE BY PCR     Status: Normal   Collection Time   03/09/12  8:30 PM      Component Value Range Status Comment   C difficile by pcr NEGATIVE  NEGATIVE Final   CULTURE, BLOOD (ROUTINE X 2)     Status: Normal (Preliminary result)   Collection Time   03/13/12 10:45 PM      Component Value Range Status Comment   Specimen Description BLOOD LEFT ARM   Final    Special Requests     Final    Value: BOTTLES DRAWN AEROBIC AND ANAEROBIC 7CC BLUE,5CC RED   Culture  Setup Time 03/14/2012 12:19   Final    Culture     Final    Value: GRAM POSITIVE COCCI IN CLUSTERS     Note: CRITICAL RESULT CALLED TO, READ  BACK BY AND VERIFIED WITH: MANDISIA AT 2140 03/14/12 BY SNOLO   Report Status PENDING   Incomplete   CULTURE, BLOOD (ROUTINE X 2)     Status: Normal (Preliminary result)   Collection Time   03/13/12 10:45 PM      Component Value Range Status Comment   Specimen Description BLOOD LEFT ARM   Final    Special Requests BOTTLES DRAWN AEROBIC ONLY 5CC   Final    Culture  Setup Time 03/14/2012 12:19   Final    Culture     Final    Value: GRAM POSITIVE COCCI IN CLUSTERS     Note: CRITICAL RESULT CALLED TO, READ BACK BY AND VERIFIED WITH: MANDISIA AT 2141 03/14/12 BY SNOLO   Report Status PENDING   Incomplete     Medical History: Past Medical History  Diagnosis Date  . Diabetes mellitus   . Hypertension   . Hyperlipidemia   . CHF (congestive heart failure)   . Enlarged heart   . Pneumonia     hx of     Medications:  Prescriptions prior to admission  Medication Sig Dispense Refill  . aspirin EC 81 MG EC tablet Take 1 tablet (81 mg total) by mouth daily.  30 tablet  3  . carvedilol (COREG) 25 MG tablet Take 1 tablet (25 mg total) by mouth 2 (two) times daily with a meal.  60 tablet  3  . diphenhydrAMINE (BENADRYL) 25 MG tablet Take 25 mg by mouth every 8 (eight) hours as needed. For itching      . diphenhydrAMINE-zinc acetate (BENADRYL) cream Apply 1 application topically 3 (three) times daily as needed. As needed for rash.      . furosemide (LASIX) 80 MG tablet Take 80 mg by mouth 2 (two) times daily.      Marland Kitchen glipiZIDE (GLUCOTROL) 5 MG tablet Take 5 mg by mouth 2 (two) times daily before a meal.      . isosorbide-hydrALAZINE (BIDIL) 20-37.5 MG per tablet Take 1 tablet by mouth 3 (three) times daily.      . potassium chloride SA (K-DUR,KLOR-CON) 20 MEQ tablet Take 20 mEq by mouth 2 (two) times daily.      Marland Kitchen spironolactone (ALDACTONE) 25 MG tablet Take 25 mg by mouth 2 (two) times daily.       Assessment: 66 yo male with 2/2 blood cultures positive for GPC for empiric vancomycin   Goal of  Therapy:  Vancomycin trough level 15-20 mcg/ml  Plan:  Vancomycin 2 g IV now, then 1250 mg IV q48h F/U renal function  Adrieana Fennelly, Gary Fleet 03/15/2012,1:38 AM

## 2012-03-15 NOTE — Progress Notes (Signed)
Subjective:  Patient denies any chest pain or shortness of breath states feels better. Blood cultures positive for gram-positive cocci final culture pending started on IV vancomycin per pharmacy  Objective:  Vital Signs in the last 24 hours: Temp:  [98.1 F (36.7 C)-98.9 F (37.2 C)] 98.6 F (37 C) (10/08 0931) Pulse Rate:  [73-83] 83  (10/08 0931) Resp:  [18-20] 18  (10/08 0931) BP: (97-128)/(49-67) 124/67 mmHg (10/08 0931) SpO2:  [97 %-99 %] 97 % (10/08 0931) Weight:  [106.8 kg (235 lb 7.2 oz)] 106.8 kg (235 lb 7.2 oz) (10/08 0404)  Intake/Output from previous day: 10/07 0701 - 10/08 0700 In: 3190.5 [P.O.:660; I.V.:1730.5; IV Piggyback:800] Out: 300 [Urine:300] Intake/Output from this shift:    Physical Exam: Neck: no adenopathy, no carotid bruit, no JVD and supple, symmetrical, trachea midline Lungs: clear to auscultation bilaterally Heart: regular rate and rhythm, S1, S2 normal and Soft systolic murmur noted Abdomen: soft, non-tender; bowel sounds normal; no masses,  no organomegaly Extremities: extremities normal, atraumatic, no cyanosis or edema and Small sacral decubitus ulcer noted  Lab Results:  Basename 03/15/12 0427 03/13/12 0520  WBC 10.9* 9.2  HGB 9.8* 10.4*  PLT 121* 122*    Basename 03/14/12 1436 03/14/12 0550  NA 147* 149*  K 4.0 3.8  CL 115* 119*  CO2 20 19  GLUCOSE 214* 179*  BUN 72* 69*  CREATININE 3.62* 3.49*   No results found for this basename: TROPONINI:2,CK,MB:2 in the last 72 hours Hepatic Function Panel  Basename 03/14/12 1436  PROT 6.2  ALBUMIN 2.1*  AST 142*  ALT 92*  ALKPHOS 210*  BILITOT 0.7  BILIDIR --  IBILI --   No results found for this basename: CHOL in the last 72 hours No results found for this basename: PROTIME in the last 72 hours  Imaging: Imaging results have been reviewed and No results found.  Cardiac Studies:  Assessment/Plan:  Staph aureus bacteremia Acute on chronic renal insufficiency stage IV  multifactorial  Status post hypotension secondary to medication  dehydration  Diabetes mellitus  Hypertension  Nonischemic cardiomyopathy  Compensated systolic heart failure  Morbid obesity  Status post diarrhea  Hypoalbuminemia Plan Wound care consult Check  Cultures results Check labs in a.m.   LOS: 6 days    [name] N 03/15/2012, 11:57 AM

## 2012-03-15 NOTE — Progress Notes (Signed)
PT Cancellation Note  Patient Details Name: [name] MRN: [id number] DOB: [birth year]   Cancelled Treatment:    Pt deferring PT session at this time due to being tired & just got into recliner.  Also deferred LE exercises.  Will attempt back tomorrow.      Verdell Face, [place] [phone] 03/15/2012

## 2012-03-15 NOTE — Progress Notes (Signed)
[facility] lab called to notify about results of blood cultures x 2 for the patient. Was told that results came back positive for gram positive cocci. MD was called and notified. New orders for antibiotic were written.

## 2012-03-15 NOTE — Care Management Note (Signed)
    Page 1 of 2   03/15/2012     2:22:20 PM   CARE MANAGEMENT NOTE 03/15/2012  Patient:  [name], [name]   Account Number:  [id number]  Date Initiated:  03/15/2012  Documentation initiated by:  Tera Mater  Subjective/Objective Assessment:   65yo male admitted with CHF.  Pt. lives at home with spouse.     Action/Plan:   In to speak with pt. and spouse about [facility] services as well as DME. Pt. chose [facility] for [facility] RN and [facility] aide.   Anticipated DC Date:  03/18/2012   Anticipated DC Plan:  HOME W HOME HEALTH SERVICES      DC Planning Services  CM consult      PAC Choice  DURABLE MEDICAL EQUIPMENT  HOME HEALTH   Choice offered to / List presented to:  C-1 Patient   DME arranged  HOSPITAL BED      DME agency  [facility]     [facility] arranged  HH-1 RN  HH-4 NURSE'S AIDE      HH agency  [facility]   Status of service:  In process, will continue to follow Medicare Important Message given?   (If response is "NO", the following Medicare IM given date fields will be blank) Date Medicare IM given:   Date Additional Medicare IM given:    Discharge Disposition:    Per UR Regulation:  Reviewed for med. necessity/level of care/duration of stay  If discussed at Long Length of Stay Meetings, dates discussed:   03/15/2012    Comments:  Contact:  Malacai Grantz [phone]   03/15/12 1135 Tera Mater, RN, BSN NCM [phone] TC to Hilda Lias, with [facility] to give referral for [facility] RN, [facility] aide.  TC to [place], with [facility] to give referral for hospital bed.  Pt. has Congestive Heart failure and has to sleep with his HOB up at 30-45 degrees to sleep at night.  Pt. may dc in 1-2 days.

## 2012-03-16 ENCOUNTER — Inpatient Hospital Stay (HOSPITAL_COMMUNITY): Payer: Medicare Other

## 2012-03-16 LAB — COMPREHENSIVE METABOLIC PANEL
ALT: 91 U/L — ABNORMAL HIGH (ref 0–53)
AST: 124 U/L — ABNORMAL HIGH (ref 0–37)
CO2: 19 mEq/L (ref 19–32)
Calcium: 8.1 mg/dL — ABNORMAL LOW (ref 8.4–10.5)
Chloride: 117 mEq/L — ABNORMAL HIGH (ref 96–112)
Creatinine, Ser: 4.25 mg/dL — ABNORMAL HIGH (ref 0.50–1.35)
GFR calc Af Amer: 15 mL/min — ABNORMAL LOW (ref >90)
GFR calc non Af Amer: 13 mL/min — ABNORMAL LOW (ref >90)
Glucose, Bld: 205 mg/dL — ABNORMAL HIGH (ref 70–99)
Total Bilirubin: 0.6 mg/dL (ref 0.3–1.2)

## 2012-03-16 LAB — CBC
Hemoglobin: 10.2 g/dL — ABNORMAL LOW (ref 13.0–17.0)
MCH: 25.9 pg — ABNORMAL LOW (ref 26.0–34.0)
MCV: 80.2 fL (ref 78.0–100.0)
RBC: 3.94 MIL/uL — ABNORMAL LOW (ref 4.22–5.81)
WBC: 12.3 10*3/uL — ABNORMAL HIGH (ref 4.0–10.5)

## 2012-03-16 LAB — CULTURE, BLOOD (ROUTINE X 2)

## 2012-03-16 LAB — URINE MICROSCOPIC-ADD ON

## 2012-03-16 LAB — URINALYSIS, ROUTINE W REFLEX MICROSCOPIC
Hgb urine dipstick: NEGATIVE
Ketones, ur: 15 mg/dL — AB
Protein, ur: 30 mg/dL — AB
Urobilinogen, UA: 1 mg/dL (ref 0.0–1.0)

## 2012-03-16 LAB — GLUCOSE, CAPILLARY
Glucose-Capillary: 286 mg/dL — ABNORMAL HIGH (ref 70–99)
Glucose-Capillary: 195 mg/dL — ABNORMAL HIGH (ref 70–99)
Glucose-Capillary: 243 mg/dL — ABNORMAL HIGH (ref 70–99)
Glucose-Capillary: 201 mg/dL — ABNORMAL HIGH (ref 70–99)

## 2012-03-16 LAB — IRON AND TIBC: Iron: 67 ug/dL (ref 42–135)

## 2012-03-16 LAB — FERRITIN: Ferritin: 748 ng/mL — ABNORMAL HIGH (ref 22–322)

## 2012-03-16 MED ORDER — CEFAZOLIN SODIUM-DEXTROSE 2-3 GM-% IV SOLR
2.0000 g | Freq: Once | INTRAVENOUS | Status: AC
  Administered 2012-03-16: 2 g via INTRAVENOUS
  Filled 2012-03-16: qty 50

## 2012-03-16 MED ORDER — MOXIFLOXACIN HCL 400 MG PO TABS: 400.0000 mg | ORAL_TABLET | Freq: Every day | ORAL | Status: DC

## 2012-03-16 MED ORDER — SODIUM CHLORIDE 0.45 % IV SOLN: INTRAVENOUS | Status: DC

## 2012-03-16 MED ORDER — FUROSEMIDE 80 MG PO TABS
80.0000 mg | ORAL_TABLET | Freq: Two times a day (BID) | ORAL | Status: DC
  Administered 2012-03-16: 80 mg via ORAL
  Filled 2012-03-16 (×4): qty 1

## 2012-03-16 MED ORDER — SODIUM CHLORIDE 0.9 % IV SOLN
INTRAVENOUS | Status: DC
  Administered 2012-03-17: 10 mL/h via INTRAVENOUS

## 2012-03-16 MED ORDER — CEFAZOLIN SODIUM 1-5 GM-% IV SOLN
1.0000 g | Freq: Two times a day (BID) | INTRAVENOUS | Status: DC
  Administered 2012-03-17 – 2012-03-22 (×12): 1 g via INTRAVENOUS
  Filled 2012-03-16 (×15): qty 50

## 2012-03-16 MED ORDER — ALBUMIN HUMAN 25 % IV SOLN
25.0000 g | Freq: Once | INTRAVENOUS | Status: AC
  Administered 2012-03-16: 25 g via INTRAVENOUS
  Filled 2012-03-16 (×2): qty 100

## 2012-03-16 NOTE — Progress Notes (Signed)
PT Cancellation Note  Patient Details Name: [name] MRN: [id number] DOB: 17-Jun-1945   Cancelled Treatment:    Reason Eval/Treat Not Completed: Other (comment) (Pt declined PT this am.)   Cephus Shelling 03/16/2012, 10:22 AM  03/16/2012 Cephus Shelling, PT, DPT [phone]

## 2012-03-16 NOTE — Progress Notes (Signed)
[facility]  Patient Status: New  [facility] is providing the following services: RN and HHA  If patient discharges after hours, please call [phone].   Wynelle Bourgeois 03/16/2012, 9:27 AM

## 2012-03-16 NOTE — Consult Note (Signed)
[facility]     Reason for Consult:Staph aureus bacteremia    Referring Physician: Dr. Sharyn Lull  Active Problems:  * No active hospital problems. *       . albumin human  25 g Intravenous Once  . aspirin EC  81 mg Oral Daily  .  ceFAZolin (ANCEF) IV  1 g Intravenous Q12H  .  ceFAZolin (ANCEF) IV  2 g Intravenous Once  . furosemide  80 mg Oral BID  . heparin  5,000 Units Subcutaneous Q8H  . insulin aspart  0-15 Units Subcutaneous Q4H  . insulin glargine  10 Units Subcutaneous QHS  . pantoprazole  40 mg Oral Q0600  . sodium chloride  3 mL Intravenous Q12H  . DISCONTD: carvedilol  3.125 mg Oral BID WC  . DISCONTD: cefTRIAXone (ROCEPHIN) IVPB 1 gram/50 mL D5W  1 g Intravenous QHS  . DISCONTD: isosorbide-hydrALAZINE  1 tablet Oral TID  . DISCONTD: moxifloxacin  400 mg Oral q1800  . DISCONTD: vancomycin  1,250 mg Intravenous Q48H    Recommendations: Ancef, echo, repeat blood cultures  PICC line when able per nephrology  Assessment: Staph aureus bacteremia 2/2 MSSA.  Unclear source.  Will order echo, consider TEE if needed.   Duration dependent on work up.  Antibiotics: Ancef 10/9 -  Ceftriaxone   HPI: [name] is a 66 y.o. 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  He was treated symptomatically but on 10/6 developed fever to 102 and cultured.  Blood cultures then grew MSSA.  He received one dose of vancomycin and was on ceftriaxone for possible respiratory infection.  Overall he feels better.  He is developing a bed sore on his sacral area, new since admission.     Review of Systems: Pertinent items are noted in HPI.  Past Medical History  Diagnosis Date  . Diabetes mellitus   . Hypertension   . Hyperlipidemia   . CHF (congestive heart failure)   . Enlarged heart   . Pneumonia     hx of     History  Substance Use Topics  . Smoking status: Former  Smoker    Quit date: 03/10/1984  . Smokeless tobacco: Never Used  . Alcohol Use: No    History reviewed. No pertinent family history. No Known Allergies  OBJECTIVE: Blood pressure 93/40, pulse 82, temperature 98.5 F (36.9 C), temperature source Oral, resp. rate 18, height 5\' 11"  (1.803 m), weight 235 lb 7.2 oz (106.8 kg), SpO2 98.00%. General: Awake, alert, nad Skin: no rashes Lungs: CTA Abdomen: soft, ntnd Ext - 2+ pitting edema  Microbiology: Recent Results (from the past 240 hour(s))  CLOSTRIDIUM DIFFICILE BY PCR     Status: Normal   Collection Time   03/09/12  8:30 PM      Component Value Range Status Comment   C difficile by pcr NEGATIVE  NEGATIVE Final   CULTURE, BLOOD (ROUTINE X 2)     Status: Normal   Collection Time   03/13/12 10:45 PM      Component Value Range Status Comment   Specimen Description BLOOD LEFT ARM   Final    Special Requests     Final    Value: BOTTLES DRAWN AEROBIC AND ANAEROBIC 7CC BLUE,5CC RED   Culture  Setup Time 03/14/2012 12:19   Final    Culture     Final    Value: STAPHYLOCOCCUS AUREUS  Note: SUSCEPTIBILITIES PERFORMED ON PREVIOUS CULTURE WITHIN THE LAST 5 DAYS.     Note: CRITICAL RESULT CALLED TO, READ BACK BY AND VERIFIED WITH: MANDISIA AT 2140 03/14/12 BY SNOLO   Report Status 03/16/2012 FINAL   Final   CULTURE, BLOOD (ROUTINE X 2)     Status: Normal   Collection Time   03/13/12 10:45 PM      Component Value Range Status Comment   Specimen Description BLOOD LEFT ARM   Final    Special Requests BOTTLES DRAWN AEROBIC ONLY 5CC   Final    Culture  Setup Time 03/14/2012 12:19   Final    Culture     Final    Value: STAPHYLOCOCCUS AUREUS     Note: RIFAMPIN AND GENTAMICIN SHOULD NOT BE USED AS SINGLE DRUGS FOR TREATMENT OF STAPH INFECTIONS.     Note: CRITICAL RESULT CALLED TO, READ BACK BY AND VERIFIED WITH: MANDISIA AT 2141 03/14/12 BY SNOLO   Report Status 03/16/2012 FINAL   Final    Organism ID, Bacteria STAPHYLOCOCCUS AUREUS   Final       Staci Righter, MD [facility] [phone] pager  [phone] cell 03/16/2012, 1:36 PM

## 2012-03-16 NOTE — Progress Notes (Signed)
Subjective:  Patient denies any chest pain or shortness of breath. Appetite remains poor. Blood cultures grew staph aureus sensitive to Avelox. Had episode of hypotension and renal function progressively worst. Albumin remains low  Objective:  Vital Signs in the last 24 hours: Temp:  [98.5 F (36.9 C)-99 F (37.2 C)] 98.5 F (36.9 C) (10/09 0417) Pulse Rate:  [81-84] 82  (10/09 0611) Resp:  [18] 18  (10/09 0417) BP: (93-115)/(40-65) 93/40 mmHg (10/09 0611) SpO2:  [97 %-100 %] 98 % (10/09 0417) Weight:  [106.8 kg (235 lb 7.2 oz)] 106.8 kg (235 lb 7.2 oz) (10/09 0417)  Intake/Output from previous day: 10/08 0701 - 10/09 0700 In: 2160 [P.O.:420; I.V.:1740] Out: 150 [Urine:150] Intake/Output from this shift: Total I/O In: 100 [P.O.:100] Out: -   Physical Exam: Neck: no adenopathy, no carotid bruit, no JVD and supple, symmetrical, trachea midline Lungs: clear to auscultation bilaterally Heart: regular rate and rhythm, S1, S2 normal and Soft systolic murmur noted Abdomen: soft, non-tender; bowel sounds normal; no masses,  no organomegaly Extremities: No clubbing cyanosis 2+ edema noted  Lab Results:  Basename 03/16/12 0500 03/15/12 0427  WBC 12.3* 10.9*  HGB 10.2* 9.8*  PLT 133* 121*    Basename 03/16/12 0500 03/14/12 1436  NA 148* 147*  K 3.6 4.0  CL 117* 115*  CO2 19 20  GLUCOSE 205* 214*  BUN 77* 72*  CREATININE 4.25* 3.62*   No results found for this basename: TROPONINI:2,CK,MB:2 in the last 72 hours Hepatic Function Panel  Basename 03/16/12 0500  PROT 6.3  ALBUMIN 2.1*  AST 124*  ALT 91*  ALKPHOS 237*  BILITOT 0.6  BILIDIR --  IBILI --   No results found for this basename: CHOL in the last 72 hours No results found for this basename: PROTIME in the last 72 hours  Imaging: Imaging results have been reviewed and No results found.  Cardiac Studies:  Assessment/Plan:  Staph aureus bacteremia  Progressive Acute on chronic renal insufficiency stage IV  multifactorial  Status post hypotension secondary to medication  dehydration  Diabetes mellitus  Hypertension  Nonischemic cardiomyopathy  Compensated systolic heart failure  Morbid obesity  Status post diarrhea  Hypoalbuminemia  Plan Hold BP meds as per orders Check renal ultrasound Change IV fluid to normal saline Albumin  as per orders Renal service consult Check labs in a.m.  LOS: 7 days    Jamear Carbonneau N 03/16/2012, 11:32 AM

## 2012-03-16 NOTE — Consult Note (Signed)
[facility] Consult Note [facility]  Date: 03/16/2012  Patient name: [name] Medical record number: [id number] Date of birth: 04-12-1946 Age: 66 y.o. Gender: male PCP: Robynn Pane, MD  Medical Service: Internal Medicine  Conulting physician: Rinaldo Cloud    Chief Complaint: Nausea, vomiting, diarrhea, and hyperglycemia  History of Present Illness: [name] is a 66 yo AA male with a PMH of CKD stage 2, CHF, HTN, DM2, and hypercholesteremia. He was recently discharged on 9/18 for CHF exasperation. He presented to the ED on 10/2 with nausea, vomiting, diarrhea, and hyperglycemia (BGs in 500s) and was admitted. We are consulted for rising cr. On admission his cr was 2.20 which was elevated from his 9/18 discharge of 1.77. Today it is 4.25. His BP over admission has been low with multiple episodes of 90/50's. Of note he was on Aldactone, glipizide, and lasix prior to admission and received vancomycin since admission for MSSA bacteremia. His UA on admission was negative. Pt reports some difficulty emptying bladder. No new nephrotoxins.  Also found to have MSSA bacteremia  Meds: Prior to Admission medications   Medication Sig Start Date End Date Taking? Authorizing Provider  aspirin EC 81 MG EC tablet Take 1 tablet (81 mg total) by mouth daily. 08/07/11 08/06/12 Yes Robynn Pane, MD  carvedilol (COREG) 25 MG tablet Take 1 tablet (25 mg total) by mouth 2 (two) times daily with a meal. 08/07/11 08/06/12 Yes Robynn Pane, MD  diphenhydrAMINE (BENADRYL) 25 MG tablet Take 25 mg by mouth every 8 (eight) hours as needed. For itching   Yes Historical Provider, MD  diphenhydrAMINE-zinc acetate (BENADRYL) cream Apply 1 application topically 3 (three) times daily as needed. As needed for rash.   Yes Historical Provider, MD  furosemide (LASIX) 80 MG tablet Take 80 mg by mouth 2 (two) times daily.   Yes Historical Provider, MD  glipiZIDE (GLUCOTROL) 5 MG  tablet Take 5 mg by mouth 2 (two) times daily before a meal.   Yes Historical Provider, MD  isosorbide-hydrALAZINE (BIDIL) 20-37.5 MG per tablet Take 1 tablet by mouth 3 (three) times daily.   Yes Historical Provider, MD  potassium chloride SA (K-DUR,KLOR-CON) 20 MEQ tablet Take 20 mEq by mouth 2 (two) times daily.   Yes Historical Provider, MD  spironolactone (ALDACTONE) 25 MG tablet Take 25 mg by mouth 2 (two) times daily. 08/07/11 08/06/12 Yes Robynn Pane, MD   Current facility-administered medications:0.45 % sodium chloride infusion, , Intravenous, Continuous, Robynn Pane, MD;  acetaminophen (TYLENOL) tablet 650 mg, 650 mg, Oral, Q6H PRN, Ricki Rodriguez, MD, 650 mg at 03/13/12 2035;  albumin human 25 % solution 25 g, 25 g, Intravenous, Once, Robynn Pane, MD;  alum & mag hydroxide-simeth (MAALOX/MYLANTA) 200-200-20 MG/5ML suspension 30 mL, 30 mL, Oral, Q6H PRN, Robynn Pane, MD aspirin EC tablet 81 mg, 81 mg, Oral, Daily, Robynn Pane, MD, 81 mg at 03/16/12 0927;  carvedilol (COREG) tablet 3.125 mg, 3.125 mg, Oral, BID WC, Robynn Pane, MD, 3.125 mg at 03/15/12 1628;  diphenhydrAMINE (BENADRYL) capsule 25 mg, 25 mg, Oral, Q8H PRN, Robynn Pane, MD, 25 mg at 03/11/12 2209;  heparin injection 5,000 Units, 5,000 Units, Subcutaneous, Q8H, Robynn Pane, MD, 5,000 Units at 03/16/12 0927 insulin aspart (novoLOG) injection 0-15 Units, 0-15 Units, Subcutaneous, Q4H, Robynn Pane, MD, 3 Units at 03/16/12 0832;  insulin glargine (LANTUS) injection 10 Units, 10 Units, Subcutaneous, QHS, Robynn Pane, MD, 10  Units at 03/15/12 2136;  moxifloxacin (AVELOX) tablet 400 mg, 400 mg, Oral, q1800, Robynn Pane, MD;  ondansetron (ZOFRAN) injection 4 mg, 4 mg, Intravenous, Q6H PRN, Robynn Pane, MD ondansetron (ZOFRAN) tablet 4 mg, 4 mg, Oral, Q6H PRN, Robynn Pane, MD;  pantoprazole (PROTONIX) EC tablet 40 mg, 40 mg, Oral, Q0600, Robynn Pane, MD, 40 mg at 03/16/12 0611;  sodium  chloride 0.9 % injection 3 mL, 3 mL, Intravenous, Q12H, Robynn Pane, MD, 3 mL at 03/14/12 2126;  DISCONTD: cefTRIAXone (ROCEPHIN) 1 g in dextrose 5 % 50 mL IVPB, 1 g, Intravenous, QHS, Ricki Rodriguez, MD, 1 g at 03/15/12 2138 DISCONTD: dextrose 5 % and 0.2 % NaCl infusion, , Intravenous, Continuous, Ricki Rodriguez, MD, Last Rate: 75 mL/hr at 03/16/12 0415;  DISCONTD: isosorbide-hydrALAZINE (BIDIL) 20-37.5 MG per tablet 1 tablet, 1 tablet, Oral, TID, Robynn Pane, MD, 1 tablet at 03/15/12 2148;  DISCONTD: vancomycin (VANCOCIN) 1,250 mg in sodium chloride 0.9 % 250 mL IVPB, 1,250 mg, Intravenous, Q48H, Robynn Pane, MD  Allergies: Review of patient's allergies indicates no known allergies. Past Medical History  Diagnosis Date  . Diabetes mellitus   . Hypertension   . Hyperlipidemia   . CHF (congestive heart failure)   . Enlarged heart   . Pneumonia     hx of    Past Surgical History  Procedure Date  . Right leg fracture   . Skin graft   . Knee surgery    History reviewed. No pertinent family history. History   Social History  . Marital Status: Married    Spouse Name: N/A    Number of Children: N/A  . Years of Education: N/A   Occupational History  . Not on file.   Social History Main Topics  . Smoking status: Former Smoker    Quit date: 03/10/1984  . Smokeless tobacco: Never Used  . Alcohol Use: No  . Drug Use: No  . Sexually Active: Yes   Other Topics Concern  . Not on file   Social History Narrative  . No narrative on file   Family hx: Reports that sister has ESRD from HTN but has declined HD tx  Review of Systems: Review of Systems -  General ROS: negative for - chills, fever, night sweats or weight loss Cardiovascular ROS: no chest pain or dyspnea on exertion Respiratory ROS: no cough, shortness of breath, or wheezing Gastrointestinal ROS: no abdominal pain, change in bowel habits, or black or bloody stools No neuropathic sxs No dysuria  Physical  Exam: Blood pressure 93/40, pulse 82, temperature 98.5 F (36.9 C), temperature source Oral, resp. rate 18, height 5\' 11"  (1.803 m), weight 106.8 kg (235 lb 7.2 oz), SpO2 98.00%. BP 93/40  Pulse 82  Temp 98.5 F (36.9 C) (Oral)  Resp 18  Ht 5\' 11"  (1.803 m)  Wt 106.8 kg (235 lb 7.2 oz)  BMI 32.84 kg/m2  SpO2 98% General appearance: alert, cooperative, no distress and moderately obese Head: Normocephalic, without obvious abnormality, atraumatic Eyes: conjunctive clear, PERRL, EOMI Throat: lips, mucosa, and tongue normal; teeth and gums normal Neck: no adenopathy, supple, symmetrical, trachea midline and thyroid not enlarged, symmetric, no tenderness/mass/nodules Lungs: diminished breath sounds bilaterally Heart: regular rate and rhythm, S1, S2 normal, no murmur, click, rub or gallop Abdomen: soft, non-tender; bowel sounds normal; no masses,  no organomegaly Extremities: edema 2+ to calves bilaterally, diabetic foot wound on right foot, callused and dry, no pus Pulses:  2+ and symmetric Skin: dry, exfoliation over mostly hands and feet Neurologic: Grossly normal  Lab results: Basic Metabolic Panel:  Lab 03/16/12 1027 03/14/12 1436 03/14/12 0550  NA 148* 147* 149*  K 3.6 4.0 3.8  CL 117* 115* 119*  CO2 19 20 19   GLUCOSE 205* 214* 179*  BUN 77* 72* 69*  CREATININE 4.25* 3.62* 3.49*  CALCIUM 8.1* 7.9* 7.9*  ALB -- -- --  PHOS -- -- --   Liver Function Tests:  Lab 03/16/12 0500 03/14/12 1436 03/13/12 0520  AST 124* 142* 135*  ALT 91* 92* 77*  ALKPHOS 237* 210* 208*  BILITOT 0.6 0.7 0.4  PROT 6.3 6.2 6.3  ALBUMIN 2.1* 2.1* 2.2*    Lab 03/09/12 1243  AMMONIA 25    CBC:  Lab 03/16/12 0500 03/15/12 0427 03/13/12 0520 03/12/12 0500 03/11/12 0530 03/09/12 1754  WBC 12.3* 10.9* 9.2 -- -- --  NEUTROABS -- -- -- -- -- 5.0  HGB 10.2* 9.8* 10.4* -- -- --  HCT 31.6* 29.9* 31.9* -- -- --  MCV 80.2 81.0 82.6 82.0 81.9 --  PLT 133* 121* 122* -- -- --   Blood Culture      Component Value Date/Time   SDES BLOOD LEFT ARM 03/13/2012 2245   SDES BLOOD LEFT ARM 03/13/2012 2245   SPECREQUEST BOTTLES DRAWN AEROBIC AND ANAEROBIC 7CC BLUE,5CC RED 03/13/2012 2245   SPECREQUEST BOTTLES DRAWN AEROBIC ONLY 5CC 03/13/2012 2245   CULT  Value: STAPHYLOCOCCUS AUREUS Note: SUSCEPTIBILITIES PERFORMED ON PREVIOUS CULTURE WITHIN THE LAST 5 DAYS. Note: CRITICAL RESULT CALLED TO, READ BACK BY AND VERIFIED WITH: MANDISIA AT 2140 03/14/12 BY SNOLO 03/13/2012 2245   CULT  Value: STAPHYLOCOCCUS AUREUS Note: RIFAMPIN AND GENTAMICIN SHOULD NOT BE USED AS SINGLE DRUGS FOR TREATMENT OF STAPH INFECTIONS. Note: CRITICAL RESULT CALLED TO, READ BACK BY AND VERIFIED WITH: MANDISIA AT 2141 03/14/12 BY SNOLO 03/13/2012 2245   REPTSTATUS 03/16/2012 FINAL 03/13/2012 2245   REPTSTATUS 03/16/2012 FINAL 03/13/2012 2245    Cardiac Enzymes:  Lab 03/09/12 1223  CKTOTAL --  CKMB --  CKMBINDEX --  TROPONINI <0.30   CBG:  Lab 03/16/12 1159 03/16/12 0802 03/16/12 0415 03/16/12 0043 03/15/12 2132  GLUCAP 243* 178* 195* 221* 286*    Micro Results: Recent Results (from the past 240 hour(s))  CLOSTRIDIUM DIFFICILE BY PCR     Status: Normal   Collection Time   03/09/12  8:30 PM      Component Value Range Status Comment   C difficile by pcr NEGATIVE  NEGATIVE Final   CULTURE, BLOOD (ROUTINE X 2)     Status: Normal   Collection Time   03/13/12 10:45 PM      Component Value Range Status Comment   Specimen Description BLOOD LEFT ARM   Final    Special Requests     Final    Value: BOTTLES DRAWN AEROBIC AND ANAEROBIC 7CC BLUE,5CC RED   Culture  Setup Time 03/14/2012 12:19   Final    Culture     Final    Value: STAPHYLOCOCCUS AUREUS     Note: SUSCEPTIBILITIES PERFORMED ON PREVIOUS CULTURE WITHIN THE LAST 5 DAYS.     Note: CRITICAL RESULT CALLED TO, READ BACK BY AND VERIFIED WITH: MANDISIA AT 2140 03/14/12 BY SNOLO   Report Status 03/16/2012 FINAL   Final   CULTURE, BLOOD (ROUTINE X 2)     Status: Normal    Collection Time   03/13/12 10:45 PM      Component  Value Range Status Comment   Specimen Description BLOOD LEFT ARM   Final    Special Requests BOTTLES DRAWN AEROBIC ONLY 5CC   Final    Culture  Setup Time 03/14/2012 12:19   Final    Culture     Final    Value: STAPHYLOCOCCUS AUREUS     Note: RIFAMPIN AND GENTAMICIN SHOULD NOT BE USED AS SINGLE DRUGS FOR TREATMENT OF STAPH INFECTIONS.     Note: CRITICAL RESULT CALLED TO, READ BACK BY AND VERIFIED WITH: MANDISIA AT 2141 03/14/12 BY SNOLO   Report Status 03/16/2012 FINAL   Final    Organism ID, Bacteria STAPHYLOCOCCUS AUREUS   Final    Assessment & Plan by Problem: [name] is a 66 yo AA male with a PMH of CKD stage 2, CHF, HTN, DM2, and hypercholesteremia.  1. AKI on CKD- likely due to dehydration in the setting of lasix use on admission. Hypotension may be playing a part in continued rising cr. Will closely monitor  - Lasix   - Stop Aldactone and glipizide  - Renal Diet with strict fluid restriction  - Foley   - Stop BP meds and continue to monitor  - CMET in am  2. CHF- pt is very fluid overloaded + 8L since admission. Lasix should help.   - continue per primary team  3. HTN- pt has been hypotensive on admission which may be contributing to AKI  - d/c BP meds  - monitor  4. DM2- Pts most recent HbA1c is 10.5- restart basal insulin and consider short acting before meals  5. Hypoalbuminemia- continue per primary team.    This is a Psychologist, occupational Note.  The care of the patient was discussed with Dr. Briant Cedar and the assessment and plan was formulated with their assistance.  Please see their note for official documentation of the patient encounter.   Signed: V. Ol[place], [place], PA-S2  03/16/2012, 12:19 PM  Pt seen and examined and note reviewed.   66 yo BM with CKD3 sec DM now with worsening renal fx.  Acute component most likely due to hypotension and bacteremia.  BP remains on low side.  Plt are decreased which i suspect is due to  the MSSA and not TTP but will check peripheral smear for schistocytes.  RECs:  Stop Iv fluids, place foley, check renal US, stop carvedilol, resume lasix, check spep, daily Scr, iron studies, renal diet.

## 2012-03-16 NOTE — Progress Notes (Signed)
Inpatient Diabetes Program Recommendations  AACE/ADA: New Consensus Statement on Inpatient Glycemic Control (2013)  Target Ranges:  Prepandial:   less than 140 mg/dL      Peak postprandial:   less than 180 mg/dL (1-2 hours)      Critically ill patients:  140 - 180 mg/dL   Reason for Visit: CBGs 03-15-12  [phone] mg/dl    47-8-29  [phone] mg/dl  Inpatient Diabetes Program Recommendations Insulin - Basal: .Increase Lantus to 15 units if CBGs continue greater than 180 mg/dl [id number]: .  Note:

## 2012-03-16 NOTE — Progress Notes (Signed)
Physical Therapy Treatment Patient Details Name: [name] MRN: [id number] DOB: June 30, 1945 Today's Date: 03/16/2012 Time: 1121-1139 PT Time Calculation (min): 18 min  PT Assessment / Plan / Recommendation Comments on Treatment Session  Pt admitted with n/v/d and continues to work with therapy. Progression is slightly limited by self with c/o fatigue. Able to tolerate slight ambulation distance today and OOB to chair.    Follow Up Recommendations  Home health PT;Supervision/Assistance - 24 hour     Does the patient have the potential to tolerate intense rehabilitation     Barriers to Discharge        Equipment Recommendations  3 in 1 bedside comode;Hospital bed    Recommendations for Other Services    Frequency Min 3X/week   Plan Discharge plan remains appropriate;Frequency remains appropriate    Precautions / Restrictions Precautions Precautions: Fall Restrictions Weight Bearing Restrictions: No   Pertinent Vitals/Pain None    Mobility  Bed Mobility Bed Mobility: Supine to Sit Supine to Sit: 4: Min assist Details for Bed Mobility Assistance: Assist for trunk to translate anterior with cues for safe sequence. Pt very relunctant with mobility. Transfers Transfers: Sit to Stand;Stand to Sit Sit to Stand: 4: Min assist;With upper extremity assist;From bed Stand to Sit: 4: Min assist;With upper extremity assist;To chair/3-in-1 Details for Transfer Assistance: Assist for balance and to initiate anterior motion. Pt relunctant, but willing with increased time and encouragement. Cues for safest hand placement. Ambulation/Gait Ambulation/Gait Assistance: 4: Min assist Ambulation Distance (Feet): 10 Feet Assistive device: Rolling walker Ambulation/Gait Assistance Details: Assist for balance and due to fatigue. Distance self-limited in nature. "I just don't feel like it now." Cues for tall posture and safe use of RW. Gait Pattern: Step-through pattern;Decreased stride  length;Shuffle Stairs: No Wheelchair Mobility Wheelchair Mobility: No    Exercises     PT Diagnosis:    PT Problem List:   PT Treatment Interventions:     PT Goals Acute Rehab PT Goals PT Goal Formulation: With patient Time For Goal Achievement: 03/19/12 Potential to Achieve Goals: Good PT Goal: Supine/Side to Sit - Progress: Progressing toward goal PT Goal: Sit to Stand - Progress: Progressing toward goal PT Goal: Stand to Sit - Progress: Progressing toward goal PT Goal: Ambulate - Progress: Progressing toward goal  Visit Information  Last PT Received On: 03/16/12 Assistance Needed: +1 Reason Eval/Treat Not Completed: Other (comment) (Pt declined PT this am.)    Subjective Data  Subjective: "I just don't think I'm gonna walk now, but I will get up." Patient Stated Goal: Get better to go home   Cognition  Overall Cognitive Status: Appears within functional limits for tasks assessed/performed Arousal/Alertness: Awake/alert Orientation Level: Appears intact for tasks assessed Behavior During Session: [facility] for tasks performed    Balance  Balance Balance Assessed: No  End of Session PT - End of Session Equipment Utilized During Treatment: Gait belt Activity Tolerance: Patient limited by fatigue Patient left: in chair;with call bell/phone within reach;with family/visitor present Nurse Communication: Mobility status   GP     Cephus Shelling 03/16/2012, 11:57 AM  03/16/2012 Cephus Shelling, PT, DPT [phone]

## 2012-03-16 NOTE — Progress Notes (Signed)
Order placed to insert foley.  Called MD to verify.  MD stated to do a bladder scan and if more than . Of urine is in the bladder to insert foley.  Bladder scan indicated of urine in bladder.  Foley inserted and an order for a UA/urine culture was placed.  Urine sent to lab.  Will continue to monitor.

## 2012-03-16 NOTE — Progress Notes (Signed)
Abx consult:  Pt has active staph bacteremia (MSSA). He has been on vanc/rocephin for the past couple of days. Abx was changed to Avelox today. D/w with Dr. Sharyn Lull, will change avelox to cefazolin since it's the standard of care for MSSA bacteremia. He is also ok with getting an ID consult. I'll call Dr. Luciana Axe to see the patient to help with length of therapy and further diagnostic.   Plan  Dc avelox Ancef 2g IV x1 then 1g IV q12

## 2012-03-16 NOTE — Progress Notes (Signed)
1610 CNA and I were helping to give patient a bath when it was noticed that patient appeared to have wound to his buttocks. Wound appeared redned with excoriation. Area was cleansed and protective cream applied. Patient had previously had a wound care consult and had a wound noted, however after looking at patient's buttocks it was reported to day shift RN to please follow up with wound care this am for another wound consult.

## 2012-03-17 ENCOUNTER — Encounter (HOSPITAL_COMMUNITY)
Admission: EM | Disposition: A | Discharge status: Skilled Nursing Facility | Payer: Self-pay | Source: Home / Self Care | Attending: Cardiology

## 2012-03-17 ENCOUNTER — Encounter (HOSPITAL_COMMUNITY): Payer: Self-pay | Admitting: *Deleted

## 2012-03-17 HISTORY — PX: TEE WITHOUT CARDIOVERSION: SHX5443

## 2012-03-17 LAB — COMPREHENSIVE METABOLIC PANEL
ALT: 113 U/L — ABNORMAL HIGH (ref 0–53)
AST: 226 U/L — ABNORMAL HIGH (ref 0–37)
Albumin: 2.2 g/dL — ABNORMAL LOW (ref 3.5–5.2)
Alkaline Phosphatase: 294 U/L — ABNORMAL HIGH (ref 39–117)
BUN: 83 mg/dL — ABNORMAL HIGH (ref 6–23)
CO2: 20 mEq/L (ref 19–32)
Calcium: 8.1 mg/dL — ABNORMAL LOW (ref 8.4–10.5)
Chloride: 117 mEq/L — ABNORMAL HIGH (ref 96–112)
Creatinine, Ser: 4.78 mg/dL — ABNORMAL HIGH (ref 0.50–1.35)
GFR calc Af Amer: 13 mL/min — ABNORMAL LOW (ref >90)
GFR calc non Af Amer: 11 mL/min — ABNORMAL LOW (ref >90)
Glucose, Bld: 118 mg/dL — ABNORMAL HIGH (ref 70–99)
Potassium: 3.9 mEq/L (ref 3.5–5.1)
Sodium: 152 mEq/L — ABNORMAL HIGH (ref 135–145)
Total Bilirubin: 0.8 mg/dL (ref 0.3–1.2)
Total Protein: 6.3 g/dL (ref 6.0–8.3)

## 2012-03-17 LAB — URINALYSIS, ROUTINE W REFLEX MICROSCOPIC
Glucose, UA: NEGATIVE mg/dL
Ketones, ur: 15 mg/dL — AB
Protein, ur: 100 mg/dL — AB
pH: 5 (ref 5.0–8.0)

## 2012-03-17 LAB — CREATININE, URINE, RANDOM: Creatinine, Urine: 191.98 mg/dL

## 2012-03-17 LAB — CBC
HCT: 33.1 % — ABNORMAL LOW (ref 39.0–52.0)
Hemoglobin: 10.7 g/dL — ABNORMAL LOW (ref 13.0–17.0)
MCH: 25.8 pg — ABNORMAL LOW (ref 26.0–34.0)
MCHC: 32.3 g/dL (ref 30.0–36.0)
MCV: 79.8 fL (ref 78.0–100.0)
Platelets: 135 10*3/uL — ABNORMAL LOW (ref 150–400)
RBC: 4.15 MIL/uL — ABNORMAL LOW (ref 4.22–5.81)
RDW: 16.7 % — ABNORMAL HIGH (ref 11.5–15.5)
WBC: 13.3 10*3/uL — ABNORMAL HIGH (ref 4.0–10.5)

## 2012-03-17 LAB — GLUCOSE, CAPILLARY
Glucose-Capillary: 124 mg/dL — ABNORMAL HIGH (ref 70–99)
Glucose-Capillary: 132 mg/dL — ABNORMAL HIGH (ref 70–99)
Glucose-Capillary: 101 mg/dL — ABNORMAL HIGH (ref 70–99)

## 2012-03-17 LAB — URINE CULTURE
Colony Count: NO GROWTH
Culture: NO GROWTH

## 2012-03-17 LAB — URINE MICROSCOPIC-ADD ON

## 2012-03-17 SURGERY — ECHOCARDIOGRAM, TRANSESOPHAGEAL
Anesthesia: Moderate Sedation

## 2012-03-17 MED ORDER — MIDAZOLAM HCL 10 MG/2ML IJ SOLN
INTRAMUSCULAR | Status: DC | PRN
  Administered 2012-03-17 (×3): 1 mg via INTRAVENOUS

## 2012-03-17 MED ORDER — SODIUM CHLORIDE 0.9 % IV BOLUS (SEPSIS)
500.0000 mL | Freq: Once | INTRAVENOUS | Status: AC
  Administered 2012-03-17: 500 mL via INTRAVENOUS

## 2012-03-17 MED ORDER — BUTAMBEN-TETRACAINE-BENZOCAINE 2-2-14 % EX AERO
INHALATION_SPRAY | CUTANEOUS | Status: DC | PRN
  Administered 2012-03-17: 2 via TOPICAL

## 2012-03-17 MED ORDER — FENTANYL CITRATE 0.05 MG/ML IJ SOLN
INTRAMUSCULAR | Status: AC
  Filled 2012-03-17: qty 2

## 2012-03-17 MED ORDER — FENTANYL CITRATE 0.05 MG/ML IJ SOLN
INTRAMUSCULAR | Status: DC | PRN
  Administered 2012-03-17 (×2): 25 ug via INTRAVENOUS

## 2012-03-17 MED ORDER — MIDAZOLAM HCL 5 MG/ML IJ SOLN
INTRAMUSCULAR | Status: AC
  Filled 2012-03-17: qty 2

## 2012-03-17 NOTE — CV Procedure (Signed)
INDICATIONS:   The patient is 66 years old male with bacteremia is here for r/o endocarditis.  PROCEDURE:  Informed consent was discussed including risks, benefits and alternatives for the procedure.  Risks include, but are not limited to, cough, sore throat, vomiting, nausea, somnolence, esophageal and stomach trauma or perforation, bleeding, low blood pressure, aspiration, pneumonia, infection, trauma to the teeth and death.    Patient was given sedation.  The oropharynx was anesthetized with topical lidocaine.  The transesophageal probe was inserted in the esophagus and stomach and multiple views were obtained.  Agitated saline was used after the transesophageal probe was removed from the body.  The patient was kept under observation until the patient left the procedure room.  The patient left the procedure room in stable condition.   COMPLICATIONS:  There were no immediate complications.  FINDINGS:  1. LEFT VENTRICLE: The left ventricle has moderate hypertrophy and normal function.  Wall motion is normal.  No thrombus or masses seen in the left ventricle.  2. RIGHT VENTRICLE:  The right ventricle is normal in structure and function without any thrombus or masses. Prominent moderator band.   3. LEFT ATRIUM:  The left atrium is normal without any thrombus or masses.  4. LEFT ATRIAL APPENDAGE:  The left atrial appendage is free of any thrombus or masses.  5. RIGHT ATRIUM:  The right atrium is free of any thrombus or masses. Large euchtatian valve. Catheter seen in SVC and reaching up to to RA.  6. ATRIAL SEPTUM:  The atrial septum is normal with  PFO as evidenced by few bubbles crossing over with abdominal pressure.  7. MITRAL VALVE:  The mitral valve is normal in structure and function without masses, stenosis or vegetations. Trace MR.  8. TRICUSPID VALVE:  The tricuspid valve is normal in structure and function without masses, stenosis or vegetations. Trace TR  9. AORTIC VALVE:  The  aortic valve is normal in structure and function without regurgitation, masses, stenosis or vegetations.   10. PULMONIC VALVE:  The pulmonic valve is normal in structure and function without regurgitation, masses, stenosis or vegetations.  11. AORTIC ARCH, ASCENDING AND DESCENDING AORTA:  The aorta had moderate atherosclerosis in the ascending or descending aorta.  The aortic arch was normal.  IMPRESSION:   Moderate LVH with normal LV systolic function. Normal MV, TV, AV and PV without vegetation. Aortic atherosclerosis PFO.  RECOMMENDATIONS:    Medical treatment.

## 2012-03-17 NOTE — Progress Notes (Signed)
PT Cancellation Note  Patient Details Name: [name] MRN: [id number] DOB: 05-28-1946   Cancelled Treatment:     Pt sleeping upon arrival but pt's wife present.  Wife requests to hold therapy for today due to pt had TEE this AM & just now getting some rest.  Will f/u tomorrow.      Verdell Face, [place] [phone] 03/17/2012

## 2012-03-17 NOTE — Progress Notes (Signed)
PA Student Daily Progress Note [facility] Subjective: Pt states he is feeling well. Has many questions about the TEE. He denies CP, SOB, Nausea, vomiting, abd pain, dizziness, and headache. Complains of skin dryness and pruritis.   Objective:  Filed Vitals:   03/16/12 1543 03/16/12 1622 03/16/12 2116 03/17/12 0607  BP: 115/55 96/52 112/48 112/41  Pulse: 78 82 77 74  Temp: 97.5 F (36.4 C) 97.7 F (36.5 C) 97.8 F (36.6 C) 98.6 F (37 C)  TempSrc: Oral  Oral Oral  Resp: 18  18 18   Height:      Weight:    107.23 kg (236 lb 6.4 oz)  SpO2: 98%  99% 100%   Physical Exam: General appearance: alert, cooperative, no distress and moderately obese  Lungs: diminished breath sounds bilaterally  Heart: regular rate and rhythm, S1, S2 normal, no murmur, click, rub or gallop  Abdomen: soft, non-tender; bowel sounds normal; no masses, no organomegaly  Extremities: edema 2+ to calves bilaterally, diabetic foot wound on right foot, callused and dry, no pus  Skin: dry, exfoliation over mostly hands and feet  Neurologic: Grossly normal  Assessment/Plan: Mr. Alkire is a 66 yo AA male with a PMH of CKD stage 2, CHF, HTN, DM2, and hypercholesteremia.  1. AKI on CKD- likely due to dehydration in the setting of lasix use on admission. Hypotension may be playing a part in continued rising cr. Cr. Is 4.78 today, his BP has been stable at 112/41. Urine output is low despite lasix. Will closely monitor   - Lasix   - Renal Diet with strict fluid restriction    - CMET in am   2. CHF- pt is very fluid overloaded + 8L since admission. Lasix should help.   - continue per primary team  3. HTN- pt has been hypotensive on admission which may be contributing to AKI   - d/c BP meds   - monitor  4. DM2- Pts most recent HbA1c is 10.5- restart basal insulin and consider short acting before meals  5. Hypoalbuminemia- continue per primary team.   Labs: Basic Metabolic Panel:  Lab 03/17/12 1610  03/16/12 1415 03/16/12 0500 03/14/12 1436  NA 152* -- 148* 147*  K 3.9 -- 3.6 4.0  CL 117* -- 117* 115*  CO2 20 -- 19 20  GLUCOSE 118* -- 205* 214*  BUN 83* -- 77* 72*  CREATININE 4.78* -- 4.25* 3.62*  CALCIUM 8.1* -- 8.1* 7.9*  ALB -- -- -- --  PHOS -- 4.5 -- --   Liver Function Tests:  Lab 03/17/12 0700 03/16/12 0500 03/14/12 1436  AST 226* 124* 142*  ALT 113* 91* 92*  ALKPHOS 294* 237* 210*  BILITOT 0.8 0.6 0.7  PROT 6.3 6.3 6.2  ALBUMIN 2.2* 2.1* 2.1*   No results found for this basename: LIPASE:3,AMYLASE:3 in the last 168 hours No results found for this basename: AMMONIA:3 in the last 168 hours INR: @resultsinr3 @ CBC:  Lab 03/17/12 0700 03/16/12 0500 03/15/12 0427 03/13/12 0520 03/12/12 0500  WBC 13.3* 12.3* 10.9* -- --  NEUTROABS -- -- -- -- --  HGB 10.7* 10.2* 9.8* -- --  HCT 33.1* 31.6* 29.9* -- --  MCV 79.8 80.2 81.0 82.6 82.0  PLT 135* 133* 121* -- --   Blood Culture    Component Value Date/Time   SDES BLOOD LEFT ARM 03/13/2012 2245   SDES BLOOD LEFT ARM 03/13/2012 2245   SPECREQUEST BOTTLES DRAWN AEROBIC AND ANAEROBIC 7CC BLUE,5CC RED 03/13/2012 2245  SPECREQUEST BOTTLES DRAWN AEROBIC ONLY 5CC 03/13/2012 2245   CULT  Value: STAPHYLOCOCCUS AUREUS Note: SUSCEPTIBILITIES PERFORMED ON PREVIOUS CULTURE WITHIN THE LAST 5 DAYS. Note: CRITICAL RESULT CALLED TO, READ BACK BY AND VERIFIED WITH: MANDISIA AT 2140 03/14/12 BY SNOLO 03/13/2012 2245   CULT  Value: STAPHYLOCOCCUS AUREUS Note: RIFAMPIN AND GENTAMICIN SHOULD NOT BE USED AS SINGLE DRUGS FOR TREATMENT OF STAPH INFECTIONS. Note: CRITICAL RESULT CALLED TO, READ BACK BY AND VERIFIED WITH: MANDISIA AT 2141 03/14/12 BY SNOLO 03/13/2012 2245   REPTSTATUS 03/16/2012 FINAL 03/13/2012 2245   REPTSTATUS 03/16/2012 FINAL 03/13/2012 2245    Cardiac Enzymes: No results found for this basename: CKTOTAL:5,CKMB:5,CKMBINDEX:5,TROPONINI:5 in the last 168 hours CBG:  Lab 03/17/12 0743 03/17/12 0513 03/17/12 0138 03/16/12 2132  03/16/12 1606  GLUCAP 101* 132* 124* 201* 283*   Iron Studies:  Basename 03/16/12 1415  IRON 67  TIBC NOT CALC  TRANSFERRIN --  FERRITIN 748*    Micro Results: Recent Results (from the past 240 hour(s))  CLOSTRIDIUM DIFFICILE BY PCR     Status: Normal   Collection Time   03/09/12  8:30 PM      Component Value Range Status Comment   C difficile by pcr NEGATIVE  NEGATIVE Final   CULTURE, BLOOD (ROUTINE X 2)     Status: Normal   Collection Time   03/13/12 10:45 PM      Component Value Range Status Comment   Specimen Description BLOOD LEFT ARM   Final    Special Requests     Final    Value: BOTTLES DRAWN AEROBIC AND ANAEROBIC 7CC BLUE,5CC RED   Culture  Setup Time 03/14/2012 12:19   Final    Culture     Final    Value: STAPHYLOCOCCUS AUREUS     Note: SUSCEPTIBILITIES PERFORMED ON PREVIOUS CULTURE WITHIN THE LAST 5 DAYS.     Note: CRITICAL RESULT CALLED TO, READ BACK BY AND VERIFIED WITH: MANDISIA AT 2140 03/14/12 BY SNOLO   Report Status 03/16/2012 FINAL   Final   CULTURE, BLOOD (ROUTINE X 2)     Status: Normal   Collection Time   03/13/12 10:45 PM      Component Value Range Status Comment   Specimen Description BLOOD LEFT ARM   Final    Special Requests BOTTLES DRAWN AEROBIC ONLY 5CC   Final    Culture  Setup Time 03/14/2012 12:19   Final    Culture     Final    Value: STAPHYLOCOCCUS AUREUS     Note: RIFAMPIN AND GENTAMICIN SHOULD NOT BE USED AS SINGLE DRUGS FOR TREATMENT OF STAPH INFECTIONS.     Note: CRITICAL RESULT CALLED TO, READ BACK BY AND VERIFIED WITH: MANDISIA AT 2141 03/14/12 BY SNOLO   Report Status 03/16/2012 FINAL   Final    Organism ID, Bacteria STAPHYLOCOCCUS AUREUS   Final    Studies/Results: US Renal  03/16/2012  *RADIOLOGY REPORT*  Clinical Data: 66 year old male.  Hydronephrosis. Renal insufficiency.  RENAL/URINARY TRACT ULTRASOUND COMPLETE  Comparison:  None.  Findings:  Right Kidney:  No hydronephrosis.  Renal length 13.4 cm.  Cortical echogenicity within  normal limits.  Renal length 13.4 cm.  Left Kidney:  No hydronephrosis.  Cortical echogenicity within normal limits.  Renal length 13.4 cm.  Bladder:  Decompressed, Foley catheter balloon visible.  IMPRESSION: Negative; no hydronephrosis.   Original Report Authenticated By: Harley Hallmark, M.D.    Medications: Scheduled Meds:   . albumin human  25 g Intravenous  Once  . aspirin EC  81 mg Oral Daily  .  ceFAZolin (ANCEF) IV  1 g Intravenous Q12H  .  ceFAZolin (ANCEF) IV  2 g Intravenous Once  . heparin  5,000 Units Subcutaneous Q8H  . insulin aspart  0-15 Units Subcutaneous Q4H  . insulin glargine  10 Units Subcutaneous QHS  . pantoprazole  40 mg Oral Q0600  . sodium chloride  3 mL Intravenous Q12H  . DISCONTD: carvedilol  3.125 mg Oral BID WC  . DISCONTD: cefTRIAXone (ROCEPHIN) IVPB 1 gram/50 mL D5W  1 g Intravenous QHS  . DISCONTD: furosemide  80 mg Oral BID  . DISCONTD: isosorbide-hydrALAZINE  1 tablet Oral TID  . DISCONTD: moxifloxacin  400 mg Oral q1800  . DISCONTD: vancomycin  1,250 mg Intravenous Q48H   Continuous Infusions:   . sodium chloride    . DISCONTD: sodium chloride    . DISCONTD: dextrose 5 % and 0.2 % NaCl Stopped (03/16/12 1340)   PRN Meds:.acetaminophen, alum & mag hydroxide-simeth, diphenhydrAMINE, ondansetron (ZOFRAN) IV, ondansetron   This is a Psychologist, occupational Note.  The care of the patient was discussed with Dr. Briant Cedar and the assessment and plan formulated with their assistance.  Please see their attached note for official documentation of the daily encounter.  Aniceto Boss, MA, PA-S2 03/17/2012, 9:03 AM Pt seen and examined.  Scr has increased as has his SNa and I suspect he may be intravascularly dry.  Will hold lasix and 500cc NS.  Recheck labs in am.  Some blood in foley.  ? Foley trauma

## 2012-03-17 NOTE — Progress Notes (Signed)
[facility]  Date of Admission:  03/09/2012  Antibiotics: Ancef 10/9 -  Ceftriaxone 10/6-8  Subjective: Tells me he does not feel well, though nothing specific  Objective: Temp:  [97.1 F (36.2 C)-98.6 F (37 C)] 97.7 F (36.5 C) (10/10 1453) Pulse Rate:  [70-83] 83  (10/10 1453) Resp:  [10-75] 18  (10/10 1453) BP: (93-148)/(41-72) 125/51 mmHg (10/10 1453) SpO2:  [95 %-100 %] 99 % (10/10 1453) Weight:  [236 lb 6.4 oz (107.23 kg)] 236 lb 6.4 oz (107.23 kg) (10/10 0607)  General: Awake, nad Skin: no rashes Lungs: CTA B Cor: RRR   Lab Results Lab Results  Component Value Date   WBC 13.3* 03/17/2012   HGB 10.7* 03/17/2012   HCT 33.1* 03/17/2012   MCV 79.8 03/17/2012   PLT 135* 03/17/2012    Lab Results  Component Value Date   CREATININE 4.78* 03/17/2012   BUN 83* 03/17/2012   NA 152* 03/17/2012   K 3.9 03/17/2012   CL 117* 03/17/2012   CO2 20 03/17/2012    Lab Results  Component Value Date   ALT 113* 03/17/2012   AST 226* 03/17/2012   ALKPHOS 294* 03/17/2012   BILITOT 0.8 03/17/2012      Microbiology: Recent Results (from the past 240 hour(s))  CLOSTRIDIUM DIFFICILE BY PCR     Status: Normal   Collection Time   03/09/12  8:30 PM      Component Value Range Status Comment   C difficile by pcr NEGATIVE  NEGATIVE Final   CULTURE, BLOOD (ROUTINE X 2)     Status: Normal   Collection Time   03/13/12 10:45 PM      Component Value Range Status Comment   Specimen Description BLOOD LEFT ARM   Final    Special Requests     Final    Value: BOTTLES DRAWN AEROBIC AND ANAEROBIC 7CC BLUE,5CC RED   Culture  Setup Time 03/14/2012 12:19   Final    Culture     Final    Value: STAPHYLOCOCCUS AUREUS     Note: SUSCEPTIBILITIES PERFORMED ON PREVIOUS CULTURE WITHIN THE LAST 5 DAYS.     Note: CRITICAL RESULT CALLED TO, READ BACK BY AND VERIFIED WITH: MANDISIA AT 2140 03/14/12 BY SNOLO   Report Status 03/16/2012 FINAL   Final   CULTURE, BLOOD  (ROUTINE X 2)     Status: Normal   Collection Time   03/13/12 10:45 PM      Component Value Range Status Comment   Specimen Description BLOOD LEFT ARM   Final    Special Requests BOTTLES DRAWN AEROBIC ONLY 5CC   Final    Culture  Setup Time 03/14/2012 12:19   Final    Culture     Final    Value: STAPHYLOCOCCUS AUREUS     Note: RIFAMPIN AND GENTAMICIN SHOULD NOT BE USED AS SINGLE DRUGS FOR TREATMENT OF STAPH INFECTIONS.     Note: CRITICAL RESULT CALLED TO, READ BACK BY AND VERIFIED WITH: MANDISIA AT 2141 03/14/12 BY SNOLO   Report Status 03/16/2012 FINAL   Final    Organism ID, Bacteria STAPHYLOCOCCUS AUREUS   Final     Studies/Results: US Renal  03/16/2012  *RADIOLOGY REPORT*  Clinical Data: 66 year old male.  Hydronephrosis. Renal insufficiency.  RENAL/URINARY TRACT ULTRASOUND COMPLETE  Comparison:  None.  Findings:  Right Kidney:  No hydronephrosis.  Renal length 13.4 cm.  Cortical echogenicity within normal limits.  Renal length 13.4 cm.  Left  Kidney:  No hydronephrosis.  Cortical echogenicity within normal limits.  Renal length 13.4 cm.  Bladder:  Decompressed, Foley catheter balloon visible.  IMPRESSION: Negative; no hydronephrosis.   Original Report Authenticated By: Harley Hallmark, M.D.     Assessment/Plan: 1) MSSA bacteremia - repeat blood cultures pending, done today.  TEE without vegetation.  Will continue to follow blood cutlures.  Continue with Ancef.  If blood cultures remain negative, could do 2 weeks more of Ancef.  PICC if blood cultures negative at 48 hours.    Staci Righter, MD [facility] [phone] pager   03/17/2012, 4:01 PM

## 2012-03-17 NOTE — Progress Notes (Signed)
Dressing change done.  Initial dressing loose due to dry skin, but intact.  Once removed dressing, insertion site noted with purulent drainage and crusty, dry drainage.  Area cleaned with CHG swab for 45 seconds, skin prep applied, biopatch applied and dry sterile dressing applied.  Insertion site continues oozing purulent drainage.  Endo RN notified and visualized site will report to floor RN.  Assessed peripheral for PIV- no sites noted to attempt.  Skin dry, flaky and BUE red and swollen. Pt c/o skin discomfort.

## 2012-03-17 NOTE — Progress Notes (Signed)
Subjective:  Appreciate all consultants help. Patient tolerated TEE this a.m. which showed no evidence of vegetation. Patient denies any complaints appetite remains poor  Objective:  Vital Signs in the last 24 hours: Temp:  [97.1 F (36.2 C)-98.6 F (37 C)] 97.1 F (36.2 C) (10/10 0918) Pulse Rate:  [70-82] 70  (10/10 1135) Resp:  [10-75] 18  (10/10 1135) BP: (93-148)/(41-72) 115/70 mmHg (10/10 1135) SpO2:  [95 %-100 %] 99 % (10/10 1030) Weight:  [107.23 kg (236 lb 6.4 oz)] 107.23 kg (236 lb 6.4 oz) (10/10 0607)  Intake/Output from previous day: 10/09 0701 - 10/10 0700 In: 508 [P.O.:458; IV Piggyback:50] Out: 950 [Urine:950] Intake/Output from this shift:    Physical Exam: Neck: no adenopathy, no carotid bruit, no JVD and supple, symmetrical, trachea midline Lungs: clear to auscultation bilaterally Heart: regular rate and rhythm, S1, S2 normal and Soft systolic murmur noted no S3 gallop Abdomen: soft, non-tender; bowel sounds normal; no masses,  no organomegaly Extremities: No clubbing cyanosis 2+ edema and small sacral decubitus ulcer noted  Lab Results:  Basename 03/17/12 0700 03/16/12 0500  WBC 13.3* 12.3*  HGB 10.7* 10.2*  PLT 135* 133*    Basename 03/17/12 0700 03/16/12 0500  NA 152* 148*  K 3.9 3.6  CL 117* 117*  CO2 20 19  GLUCOSE 118* 205*  BUN 83* 77*  CREATININE 4.78* 4.25*   No results found for this basename: TROPONINI:2,CK,MB:2 in the last 72 hours Hepatic Function Panel  Basename 03/17/12 0700  PROT 6.3  ALBUMIN 2.2*  AST 226*  ALT 113*  ALKPHOS 294*  BILITOT 0.8  BILIDIR --  IBILI --   No results found for this basename: CHOL in the last 72 hours No results found for this basename: PROTIME in the last 72 hours  Imaging: Imaging results have been reviewed and US Renal  03/16/2012  *RADIOLOGY REPORT*  Clinical Data: 66 year old male.  Hydronephrosis. Renal insufficiency.  RENAL/URINARY TRACT ULTRASOUND COMPLETE  Comparison:  None.   Findings:  Right Kidney:  No hydronephrosis.  Renal length 13.4 cm.  Cortical echogenicity within normal limits.  Renal length 13.4 cm.  Left Kidney:  No hydronephrosis.  Cortical echogenicity within normal limits.  Renal length 13.4 cm.  Bladder:  Decompressed, Foley catheter balloon visible.  IMPRESSION: Negative; no hydronephrosis.   Original Report Authenticated By: Harley Hallmark, M.D.     Cardiac Studies:  Assessment/Plan:  Staph aureus bacteremia probably from skin Progressive Acute on chronic renal insufficiency stage IV multifactorial  Status post hypotension secondary to medication  dehydration  Diabetes mellitus  Hypertension  Nonischemic cardiomyopathy  Compensated systolic/diastolic heart failure heart failure  Morbid obesity  Status post diarrhea  Hypoalbuminemia Plan continue present management Check labs in a.m.  LOS: 8 days    Sai Moura N 03/17/2012, 12:09 PM

## 2012-03-17 NOTE — Progress Notes (Signed)
  Echocardiogram Echocardiogram Transesophageal has been performed.  [name] 03/17/2012, 10:26 AM

## 2012-03-17 NOTE — Consult Note (Signed)
Subjective:  Here for TEE for Staph aureus and fever, r/o endocarditis.  Objective:  Vital Signs in the last 24 hours: Temp:  [97.1 F (36.2 C)-98.6 F (37 C)] 97.1 F (36.2 C) (10/10 0918) Pulse Rate:  [74-82] 77  (10/10 0918) Cardiac Rhythm:  [-] Normal sinus rhythm (10/09 2000) Resp:  [14-18] 14  (10/10 0918) BP: (96-124)/(41-62) 124/58 mmHg (10/10 0918) SpO2:  [95 %-100 %] 95 % (10/10 0918) Weight:  [107.23 kg (236 lb 6.4 oz)] 107.23 kg (236 lb 6.4 oz) (10/10 0607)  Physical Exam: BP Readings from Last 1 Encounters:  03/17/12 124/58    Wt Readings from Last 1 Encounters:  03/17/12 107.23 kg (236 lb 6.4 oz)    Weight change: 0.43 kg (15.2 oz)  HEENT: [place]/AT, Eyes-Brown, PERL, EOMI, Conjunctiva-Pale pink, Sclera-Non-icteric Neck: No JVD, No bruit, Trachea midline. Lungs:  Clear, Bilateral. Cardiac:  Regular rhythm, normal S1 and S2, no S3. II/VI systolic murmur. Abdomen:  Soft, non-tender. Extremities:  No edema present. No cyanosis. No clubbing. CNS: AxOx3, Cranial nerves grossly intact, moves all 4 extremities. Right handed. Skin: Warm and dry and scaly.   Intake/Output from previous day: 10/09 0701 - 10/10 0700 In: 508 [P.O.:458; IV Piggyback:50] Out: 950 [Urine:950]    Lab Results: BMET    Component Value Date/Time   NA 152* 03/17/2012 0700   K 3.9 03/17/2012 0700   CL 117* 03/17/2012 0700   CO2 20 03/17/2012 0700   GLUCOSE 118* 03/17/2012 0700   BUN 83* 03/17/2012 0700   CREATININE 4.78* 03/17/2012 0700   CALCIUM 8.1* 03/17/2012 0700   GFRNONAA 11* 03/17/2012 0700   GFRAA 13* 03/17/2012 0700   CBC    Component Value Date/Time   WBC 13.3* 03/17/2012 0700   RBC 4.15* 03/17/2012 0700   HGB 10.7* 03/17/2012 0700   HCT 33.1* 03/17/2012 0700   PLT 135* 03/17/2012 0700   MCV 79.8 03/17/2012 0700   MCH 25.8* 03/17/2012 0700   MCHC 32.3 03/17/2012 0700   RDW 16.7* 03/17/2012 0700   LYMPHSABS 1.4 03/09/2012 1754   MONOABS 0.4 03/09/2012 1754   EOSABS 3.7*  03/09/2012 1754   BASOSABS 0.1 03/09/2012 1754   CARDIAC ENZYMES Lab Results  Component Value Date   CKTOTAL 108 08/04/2011   CKMB 5.1* 08/04/2011   TROPONINI <0.30 03/09/2012    Assessment/Plan:  Patient Active Hospital Problem List:  Staph aureus bacteremia  Progressive Acute on chronic renal insufficiency stage IV multifactorial  Status post hypotension secondary to medication  dehydration  Diabetes mellitus  Hypertension  Nonischemic cardiomyopathy  Compensated systolic heart failure  Morbid obesity  Status post diarrhea  Hypoalbuminemia   TEE today.   LOS: 8 days    Orpah Cobb  MD  03/17/2012, 9:21 AM

## 2012-03-17 NOTE — Progress Notes (Signed)
Patient up in chair resting. Insulin coverage given per sliding scale order. Patient awake, is calm, alert and oriented x3. Patient currently complains of no signs and symptoms of pain. Catheter tip of Picc line was sent to lab for evaluation. Results pending, will continue to monitor patient as needed to end of shift.

## 2012-03-18 ENCOUNTER — Encounter (HOSPITAL_COMMUNITY): Payer: Self-pay | Admitting: Cardiovascular Disease

## 2012-03-18 LAB — GLUCOSE, CAPILLARY
Glucose-Capillary: 268 mg/dL — ABNORMAL HIGH (ref 70–99)
Glucose-Capillary: 98 mg/dL (ref 70–99)
Glucose-Capillary: 145 mg/dL — ABNORMAL HIGH (ref 70–99)
Glucose-Capillary: 229 mg/dL — ABNORMAL HIGH (ref 70–99)

## 2012-03-18 LAB — COMPREHENSIVE METABOLIC PANEL
ALT: 78 U/L — ABNORMAL HIGH (ref 0–53)
Alkaline Phosphatase: 295 U/L — ABNORMAL HIGH (ref 39–117)
BUN: 91 mg/dL — ABNORMAL HIGH (ref 6–23)
CO2: 20 mEq/L (ref 19–32)
GFR calc Af Amer: 12 mL/min — ABNORMAL LOW (ref >90)
GFR calc non Af Amer: 10 mL/min — ABNORMAL LOW (ref >90)
Glucose, Bld: 117 mg/dL — ABNORMAL HIGH (ref 70–99)
Potassium: 3.8 mEq/L (ref 3.5–5.1)
Sodium: 151 mEq/L — ABNORMAL HIGH (ref 135–145)
Total Bilirubin: 0.8 mg/dL (ref 0.3–1.2)

## 2012-03-18 LAB — CBC
MCHC: 33.2 g/dL (ref 30.0–36.0)
Platelets: 147 10*3/uL — ABNORMAL LOW (ref 150–400)
RDW: 16.9 % — ABNORMAL HIGH (ref 11.5–15.5)
WBC: 11.9 10*3/uL — ABNORMAL HIGH (ref 4.0–10.5)

## 2012-03-18 MED ORDER — ALBUMIN HUMAN 25 % IV SOLN
50.0000 g | Freq: Once | INTRAVENOUS | Status: AC
  Administered 2012-03-18: 50 g via INTRAVENOUS
  Filled 2012-03-18: qty 200

## 2012-03-18 MED ORDER — SODIUM CHLORIDE 0.45 % IV BOLUS
500.0000 mL | Freq: Once | INTRAVENOUS | Status: AC
  Administered 2012-03-18: 500 mL via INTRAVENOUS

## 2012-03-18 MED ORDER — SODIUM CHLORIDE 0.9 % IV BOLUS (SEPSIS): 500.0000 mL | Freq: Once | INTRAVENOUS | Status: DC

## 2012-03-18 NOTE — Progress Notes (Addendum)
PA Student Daily Progress Note [facility] Subjective: Pt states he is feeling well. Denies CP, SOB, nausea, vomiting, abd pain, and headache. Still complaining of dry skin and pruritis.   Objective:  Filed Vitals:   03/17/12 1135 03/17/12 1453 03/17/12 2058 03/18/12 0411  BP: 115/70 125/51 98/52 115/66  Pulse: 70 83 69 68  Temp:  97.7 F (36.5 C) 97.3 F (36.3 C) 97.9 F (36.6 C)  TempSrc:  Oral Oral Oral  Resp: 18 18 18 18   Height:      Weight:    107.8 kg (237 lb 10.5 oz)  SpO2:  99% 97% 96%   Physical Exam: General appearance: alert, cooperative, no distress and moderately obese  Lungs: diminished breath sounds bilaterally  Heart: regular rate and rhythm, S1, S2 normal, no murmur, click, rub or gallop  Abdomen: soft, non-tender; bowel sounds normal; no masses, no organomegaly  Extremities: edema 2+ to calves bilaterally, diabetic foot wound on right foot, callused and dry, no pus  Skin: dry, exfoliation over mostly hands and feet  Neurologic: Grossly normal  Assessment/Plan: Mr. Stegman is a 66 yo AA male with a PMH of CKD stage 2, CHF, HTN, DM2, and hypercholesteremia.   1. AKI on CKD- likely due to dehydration in the setting of lasix use on admission. Hypotension may be playing a part in continued rising cr. Cr. Is 5.15 today, his BP has been stable at 115/66. Urine output is low. Will closely monitor   - hold lasix  - Renal Diet with strict fluid restriction   - CMET in am  2. CHF- stable.  Has edema but Fena low suggesting pre renal state 3. HTN- pt has been hypotensive on admission which may be contributing to AKI   - d/c BP meds   - monitor  4. DM2- Pts most recent HbA1c is 10.5- restart basal insulin and consider short acting before meals  5. Hypoalbuminemia- continue per primary team.   Labs: Basic Metabolic Panel:  Lab 03/18/12 0981 03/17/12 0700 03/16/12 1415 03/16/12 0500  NA 151* 152* -- 148*  K 3.8 3.9 -- 3.6  CL 118* 117* -- 117*    CO2 20 20 -- 19  GLUCOSE 117* 118* -- 205*  BUN 91* 83* -- 77*  CREATININE 5.15* 4.78* -- 4.25*  CALCIUM 8.0* 8.1* -- 8.1*  ALB -- -- -- --  PHOS -- -- 4.5 --   Liver Function Tests:  Lab 03/18/12 0505 03/17/12 0700 03/16/12 0500  AST 150* 226* 124*  ALT 78* 113* 91*  ALKPHOS 295* 294* 237*  BILITOT 0.8 0.8 0.6  PROT 6.5 6.3 6.3  ALBUMIN 2.0* 2.2* 2.1*   No results found for this basename: LIPASE:3,AMYLASE:3 in the last 168 hours No results found for this basename: AMMONIA:3 in the last 168 hours INR: @resultsinr3 @ CBC:  Lab 03/18/12 0505 03/17/12 0700 03/16/12 0500 03/15/12 0427 03/13/12 0520  WBC 11.9* 13.3* 12.3* -- --  NEUTROABS -- -- -- -- --  HGB 11.5* 10.7* 10.2* -- --  HCT 34.6* 33.1* 31.6* -- --  MCV 79.4 79.8 80.2 81.0 82.6  PLT 147* 135* 133* -- --   Blood Culture    Component Value Date/Time   SDES CATH TIP PICC LINE 03/17/2012 1627   SPECREQUEST NONE 03/17/2012 1627   CULT NO GROWTH 03/17/2012 1627   REPTSTATUS PENDING 03/17/2012 1627    Cardiac Enzymes: No results found for this basename: CKTOTAL:5,CKMB:5,CKMBINDEX:5,TROPONINI:5 in the last 168 hours CBG:  Lab 03/18/12 0732 03/18/12  0410 03/18/12 0019 03/17/12 2021 03/17/12 1641  GLUCAP 98 123* 162* 268* 248*   Iron Studies:  Basename 03/16/12 1415  IRON 67  TIBC NOT CALC  TRANSFERRIN --  FERRITIN 748*    Micro Results: Recent Results (from the past 240 hour(s))  CLOSTRIDIUM DIFFICILE BY PCR     Status: Normal   Collection Time   03/09/12  8:30 PM      Component Value Range Status Comment   C difficile by pcr NEGATIVE  NEGATIVE Final   CULTURE, BLOOD (ROUTINE X 2)     Status: Normal   Collection Time   03/13/12 10:45 PM      Component Value Range Status Comment   Specimen Description BLOOD LEFT ARM   Final    Special Requests     Final    Value: BOTTLES DRAWN AEROBIC AND ANAEROBIC 7CC BLUE,5CC RED   Culture  Setup Time 03/14/2012 12:19   Final    Culture     Final    Value:  STAPHYLOCOCCUS AUREUS     Note: SUSCEPTIBILITIES PERFORMED ON PREVIOUS CULTURE WITHIN THE LAST 5 DAYS.     Note: CRITICAL RESULT CALLED TO, READ BACK BY AND VERIFIED WITH: MANDISIA AT 2140 03/14/12 BY SNOLO   Report Status 03/16/2012 FINAL   Final   CULTURE, BLOOD (ROUTINE X 2)     Status: Normal   Collection Time   03/13/12 10:45 PM      Component Value Range Status Comment   Specimen Description BLOOD LEFT ARM   Final    Special Requests BOTTLES DRAWN AEROBIC ONLY 5CC   Final    Culture  Setup Time 03/14/2012 12:19   Final    Culture     Final    Value: STAPHYLOCOCCUS AUREUS     Note: RIFAMPIN AND GENTAMICIN SHOULD NOT BE USED AS SINGLE DRUGS FOR TREATMENT OF STAPH INFECTIONS.     Note: CRITICAL RESULT CALLED TO, READ BACK BY AND VERIFIED WITH: MANDISIA AT 2141 03/14/12 BY SNOLO   Report Status 03/16/2012 FINAL   Final    Organism ID, Bacteria STAPHYLOCOCCUS AUREUS   Final   URINE CULTURE     Status: Normal   Collection Time   03/16/12  6:51 PM      Component Value Range Status Comment   Specimen Description URINE, RANDOM   Final    Special Requests NONE   Final    Culture  Setup Time 03/16/2012 19:54   Final    Colony Count NO GROWTH   Final    Culture NO GROWTH   Final    Report Status 03/17/2012 FINAL   Final   CULTURE, BLOOD (ROUTINE X 2)     Status: Normal (Preliminary result)   Collection Time   03/17/12  7:00 AM      Component Value Range Status Comment   Specimen Description BLOOD LEFT HAND   Final    Special Requests BOTTLES DRAWN AEROBIC AND ANAEROBIC 10CC EACH   Final    Culture  Setup Time 03/17/2012 12:45   Final    Culture     Final    Value:        BLOOD CULTURE RECEIVED NO GROWTH TO DATE CULTURE WILL BE HELD FOR 5 DAYS BEFORE ISSUING A FINAL NEGATIVE REPORT   Report Status PENDING   Incomplete   CULTURE, BLOOD (ROUTINE X 2)     Status: Normal (Preliminary result)   Collection Time   03/17/12  7:00  AM      Component Value Range Status Comment   Specimen  Description BLOOD LEFT ARM   Final    Special Requests BOTTLES DRAWN AEROBIC AND ANAEROBIC 10CC EACH   Final    Culture  Setup Time 03/17/2012 12:45   Final    Culture     Final    Value:        BLOOD CULTURE RECEIVED NO GROWTH TO DATE CULTURE WILL BE HELD FOR 5 DAYS BEFORE ISSUING A FINAL NEGATIVE REPORT   Report Status PENDING   Incomplete   CATH TIP CULTURE     Status: Normal (Preliminary result)   Collection Time   03/17/12  4:27 PM      Component Value Range Status Comment   Specimen Description CATH TIP PICC LINE   Final    Special Requests NONE   Final    Culture NO GROWTH   Final    Report Status PENDING   Incomplete    Studies/Results: US Renal  03/16/2012  *RADIOLOGY REPORT*  Clinical Data: 66 year old male.  Hydronephrosis. Renal insufficiency.  RENAL/URINARY TRACT ULTRASOUND COMPLETE  Comparison:  None.  Findings:  Right Kidney:  No hydronephrosis.  Renal length 13.4 cm.  Cortical echogenicity within normal limits.  Renal length 13.4 cm.  Left Kidney:  No hydronephrosis.  Cortical echogenicity within normal limits.  Renal length 13.4 cm.  Bladder:  Decompressed, Foley catheter balloon visible.  IMPRESSION: Negative; no hydronephrosis.   Original Report Authenticated By: Harley Hallmark, M.D.    Medications: Scheduled Meds:   . aspirin EC  81 mg Oral Daily  .  ceFAZolin (ANCEF) IV  1 g Intravenous Q12H  . heparin  5,000 Units Subcutaneous Q8H  . insulin aspart  0-15 Units Subcutaneous Q4H  . insulin glargine  10 Units Subcutaneous QHS  . pantoprazole  40 mg Oral Q0600  . sodium chloride  500 mL Intravenous Once  . sodium chloride  3 mL Intravenous Q12H   Continuous Infusions:   . sodium chloride 10 mL/hr (03/17/12 2034)   PRN Meds:.acetaminophen, alum & mag hydroxide-simeth, diphenhydrAMINE, ondansetron (ZOFRAN) IV, ondansetron, DISCONTD: butamben-tetracaine-benzocaine, DISCONTD: fentaNYL, DISCONTD: midazolam   This is a Psychologist, occupational Note.  The care of the patient  was discussed with Dr. Briant Cedar and the assessment and plan formulated with their assistance.  Please see their attached note for official documentation of the daily encounter.  Aniceto Boss, MA,  PA-S2 03/18/2012, 9:14 AM Pt seen and examined and note reviewed.  Pt feels better.  I/O's not accurate.  Fena suggest pre-renal state.  Give another 500cc IV fluids today.  Hopefully renal fx will stabilize in next 1-2 days.  Suspect hematuria related to foley trauma.  Recheck labs in am. Consider derm consult for his skin issue.

## 2012-03-18 NOTE — Progress Notes (Signed)
Subjective:  Patient denies any chest pain or shortness of breath states appetite is slightly better feels little better  Objective:  Vital Signs in the last 24 hours: Temp:  [97.2 F (36.2 C)-97.9 F (36.6 C)] 97.2 F (36.2 C) (10/11 1007) Pulse Rate:  [68-86] 86  (10/11 1007) Resp:  [18] 18  (10/11 1007) BP: (98-127)/(51-66) 127/57 mmHg (10/11 1007) SpO2:  [96 %-100 %] 100 % (10/11 1007) Weight:  [107.8 kg (237 lb 10.5 oz)] 107.8 kg (237 lb 10.5 oz) (10/11 0411)  Intake/Output from previous day: 10/10 0701 - 10/11 0700 In: 914.5 [P.O.:238; I.V.:76.5; IV Piggyback:100] Out: 700 [Urine:700] Intake/Output from this shift: Total I/O In: 120 [P.O.:120] Out: -   Physical Exam: Neck: no adenopathy, no carotid bruit, no JVD and supple, symmetrical, trachea midline Lungs: clear to auscultation bilaterally Heart: regular rate and rhythm, S1, S2 normal and Soft systolic murmur noted Abdomen: soft, non-tender; bowel sounds normal; no masses,  no organomegaly Extremities: No clubbing cyanosis 2+ edema and small sacral decubitus ulcer noted  Lab Results:  Basename 03/18/12 0505 03/17/12 0700  WBC 11.9* 13.3*  HGB 11.5* 10.7*  PLT 147* 135*    Basename 03/18/12 0505 03/17/12 0700  NA 151* 152*  K 3.8 3.9  CL 118* 117*  CO2 20 20  GLUCOSE 117* 118*  BUN 91* 83*  CREATININE 5.15* 4.78*   No results found for this basename: TROPONINI:2,CK,MB:2 in the last 72 hours Hepatic Function Panel  Basename 03/18/12 0505  PROT 6.5  ALBUMIN 2.0*  AST 150*  ALT 78*  ALKPHOS 295*  BILITOT 0.8  BILIDIR --  IBILI --   No results found for this basename: CHOL in the last 72 hours No results found for this basename: PROTIME in the last 72 hours  Imaging: Imaging results have been reviewed and US Renal  03/16/2012  *RADIOLOGY REPORT*  Clinical Data: 66 year old male.  Hydronephrosis. Renal insufficiency.  RENAL/URINARY TRACT ULTRASOUND COMPLETE  Comparison:  None.  Findings:  Right  Kidney:  No hydronephrosis.  Renal length 13.4 cm.  Cortical echogenicity within normal limits.  Renal length 13.4 cm.  Left Kidney:  No hydronephrosis.  Cortical echogenicity within normal limits.  Renal length 13.4 cm.  Bladder:  Decompressed, Foley catheter balloon visible.  IMPRESSION: Negative; no hydronephrosis.   Original Report Authenticated By: Harley Hallmark, M.D.     Cardiac Studies:  Assessment/Plan:  Staph aureus bacteremia probably from skin /catheter related sepsis Progressive Acute on chronic renal insufficiency stage IV multifactorial  Status post hypotension secondary to medication  dehydration  Diabetes mellitus  Hypertension  Nonischemic cardiomyopathy  Compensated systolic/diastolic heart failure heart failure  Morbid obesity  Status post diarrhea  Hypoalbuminemia  Plan As per orders Dr. Algie Coffer on-call for weekend  LOS: 9 days    [name] N 03/18/2012, 12:16 PM

## 2012-03-18 NOTE — Progress Notes (Signed)
[facility]  Date of Admission:  03/09/2012  Antibiotics: Ancef 10/9 -  Ceftriaxone 10/6-8  Subjective: Tells me he does not feel well, though nothing specific  Objective: Temp:  [97.2 F (36.2 C)-97.9 F (36.6 C)] 97.3 F (36.3 C) (10/11 1321) Pulse Rate:  [68-86] 71  (10/11 1321) Resp:  [18] 18  (10/11 1321) BP: (93-127)/(41-66) 125/54 mmHg (10/11 1321) SpO2:  [96 %-100 %] 100 % (10/11 1321) Weight:  [237 lb 10.5 oz (107.8 kg)] 237 lb 10.5 oz (107.8 kg) (10/11 0411)  General: Awake, nad Skin: no rashes Lungs: CTA B Cor: RRR   Lab Results Lab Results  Component Value Date   WBC 11.9* 03/18/2012   HGB 11.5* 03/18/2012   HCT 34.6* 03/18/2012   MCV 79.4 03/18/2012   PLT 147* 03/18/2012    Lab Results  Component Value Date   CREATININE 5.15* 03/18/2012   BUN 91* 03/18/2012   NA 151* 03/18/2012   K 3.8 03/18/2012   CL 118* 03/18/2012   CO2 20 03/18/2012    Lab Results  Component Value Date   ALT 78* 03/18/2012   AST 150* 03/18/2012   ALKPHOS 295* 03/18/2012   BILITOT 0.8 03/18/2012      Microbiology: Recent Results (from the past 240 hour(s))  CLOSTRIDIUM DIFFICILE BY PCR     Status: Normal   Collection Time   03/09/12  8:30 PM      Component Value Range Status Comment   C difficile by pcr NEGATIVE  NEGATIVE Final   CULTURE, BLOOD (ROUTINE X 2)     Status: Normal   Collection Time   03/13/12 10:45 PM      Component Value Range Status Comment   Specimen Description BLOOD LEFT ARM   Final    Special Requests     Final    Value: BOTTLES DRAWN AEROBIC AND ANAEROBIC 7CC BLUE,5CC RED   Culture  Setup Time 03/14/2012 12:19   Final    Culture     Final    Value: STAPHYLOCOCCUS AUREUS     Note: SUSCEPTIBILITIES PERFORMED ON PREVIOUS CULTURE WITHIN THE LAST 5 DAYS.     Note: CRITICAL RESULT CALLED TO, READ BACK BY AND VERIFIED WITH: MANDISIA AT 2140 03/14/12 BY SNOLO   Report Status 03/16/2012 FINAL   Final   CULTURE, BLOOD  (ROUTINE X 2)     Status: Normal   Collection Time   03/13/12 10:45 PM      Component Value Range Status Comment   Specimen Description BLOOD LEFT ARM   Final    Special Requests BOTTLES DRAWN AEROBIC ONLY 5CC   Final    Culture  Setup Time 03/14/2012 12:19   Final    Culture     Final    Value: STAPHYLOCOCCUS AUREUS     Note: RIFAMPIN AND GENTAMICIN SHOULD NOT BE USED AS SINGLE DRUGS FOR TREATMENT OF STAPH INFECTIONS.     Note: CRITICAL RESULT CALLED TO, READ BACK BY AND VERIFIED WITH: MANDISIA AT 2141 03/14/12 BY SNOLO   Report Status 03/16/2012 FINAL   Final    Organism ID, Bacteria STAPHYLOCOCCUS AUREUS   Final   URINE CULTURE     Status: Normal   Collection Time   03/16/12  6:51 PM      Component Value Range Status Comment   Specimen Description URINE, RANDOM   Final    Special Requests NONE   Final    Culture  Setup Time 03/16/2012  19:54   Final    Colony Count NO GROWTH   Final    Culture NO GROWTH   Final    Report Status 03/17/2012 FINAL   Final   CULTURE, BLOOD (ROUTINE X 2)     Status: Normal (Preliminary result)   Collection Time   03/17/12  7:00 AM      Component Value Range Status Comment   Specimen Description BLOOD LEFT HAND   Final    Special Requests BOTTLES DRAWN AEROBIC AND ANAEROBIC 10CC EACH   Final    Culture  Setup Time 03/17/2012 12:45   Final    Culture     Final    Value:        BLOOD CULTURE RECEIVED NO GROWTH TO DATE CULTURE WILL BE HELD FOR 5 DAYS BEFORE ISSUING A FINAL NEGATIVE REPORT   Report Status PENDING   Incomplete   CULTURE, BLOOD (ROUTINE X 2)     Status: Normal (Preliminary result)   Collection Time   03/17/12  7:00 AM      Component Value Range Status Comment   Specimen Description BLOOD LEFT ARM   Final    Special Requests BOTTLES DRAWN AEROBIC AND ANAEROBIC 10CC EACH   Final    Culture  Setup Time 03/17/2012 12:45   Final    Culture     Final    Value:        BLOOD CULTURE RECEIVED NO GROWTH TO DATE CULTURE WILL BE HELD FOR 5 DAYS  BEFORE ISSUING A FINAL NEGATIVE REPORT   Report Status PENDING   Incomplete   CATH TIP CULTURE     Status: Normal (Preliminary result)   Collection Time   03/17/12  4:27 PM      Component Value Range Status Comment   Specimen Description CATH TIP PICC LINE   Final    Special Requests NONE   Final    Culture NO GROWTH   Final    Report Status PENDING   Incomplete     Studies/Results: US Renal  03/16/2012  *RADIOLOGY REPORT*  Clinical Data: 66 year old male.  Hydronephrosis. Renal insufficiency.  RENAL/URINARY TRACT ULTRASOUND COMPLETE  Comparison:  None.  Findings:  Right Kidney:  No hydronephrosis.  Renal length 13.4 cm.  Cortical echogenicity within normal limits.  Renal length 13.4 cm.  Left Kidney:  No hydronephrosis.  Cortical echogenicity within normal limits.  Renal length 13.4 cm.  Bladder:  Decompressed, Foley catheter balloon visible.  IMPRESSION: Negative; no hydronephrosis.   Original Report Authenticated By: Harley Hallmark, M.D.     Assessment/Plan: 1) MSSA bacteremia - repeat blood cultures pending, done today.  TEE without vegetation.  Will continue to follow blood cutlures.  Continue with Ancef.  If blood cultures remain negative, could do 2 weeks more of Ancef.  PICC if blood cultures negative at 48 hours which will be tomorrow (Saturday).   - I will arrange follow up with [facility] in 2 weeks -should get CMP and cbc after 1 week -call with questions, thanks  Staci Righter, MD [facility] Infect[facility] [phone] pager   03/18/2012, 4:48 PM

## 2012-03-18 NOTE — Progress Notes (Signed)
Physical Therapy Treatment Patient Details Name: Shreyan Hinz MRN: [id number] DOB: [birth year] Today's Date: 03/18/2012 Time: 4098-1191 PT Time Calculation (min): 13 min  PT Assessment / Plan / Recommendation Comments on Treatment Session  Slow improvements with mobility - limited by fatigue.      Follow Up Recommendations  Home health PT;Supervision/Assistance - 24 hour     Does the patient have the potential to tolerate intense rehabilitation     Barriers to Discharge        Equipment Recommendations  3 in 1 bedside comode;Hospital bed    Recommendations for Other Services    Frequency Min 3X/week   Plan Discharge plan remains appropriate;Frequency remains appropriate    Precautions / Restrictions Precautions Precautions: Fall Restrictions Weight Bearing Restrictions: No   Pertinent Vitals/Pain     Mobility  Bed Mobility Bed Mobility: Supine to Sit;Sitting - Scoot to Edge of Bed Supine to Sit: 3: Mod assist;With rails;HOB elevated Sitting - Scoot to Edge of Bed: 4: Min assist Details for Bed Mobility Assistance: Assist to raise trunk from bed.  Encouraged use of rails.  Patient c/o pain  on buttocks (sore area) impacting mobility. Transfers Transfers: Sit to Stand;Stand to Sit Sit to Stand: 4: Min assist;With upper extremity assist;From bed Stand to Sit: 4: Min assist;With upper extremity assist;With armrests;To chair/3-in-1 Details for Transfer Assistance: Verbal cues for safe hand placement.  Assist to slowly lower to chair. Ambulation/Gait Ambulation/Gait Assistance: 4: Min assist Ambulation Distance (Feet): 28 Feet Assistive device: Rolling walker Ambulation/Gait Assistance Details: Cues to stand upright, and for safe use of RW. Gait Pattern: Step-through pattern;Decreased stride length;Shuffle;Trunk flexed Gait velocity: decreased      PT Goals Acute Rehab PT Goals PT Goal: Supine/Side to Sit - Progress: Progressing toward goal PT Goal: Sit to Stand -  Progress: Progressing toward goal PT Goal: Stand to Sit - Progress: Progressing toward goal PT Goal: Ambulate - Progress: Progressing toward goal  Visit Information  Last PT Received On: 03/18/12 Assistance Needed: +1    Subjective Data  Subjective: "My bottom is sore"   Cognition  Overall Cognitive Status: Appears within functional limits for tasks assessed/performed Arousal/Alertness: Awake/alert Orientation Level: Appears intact for tasks assessed Behavior During Session: Flat affect    Balance     End of Session PT - End of Session Equipment Utilized During Treatment: Gait belt Activity Tolerance: Patient limited by fatigue;Patient limited by pain Patient left: in chair;with call bell/phone within reach;with family/visitor present Nurse Communication: Mobility status   GP     Vena Austria 03/18/2012, 2:31 PM Durenda Hurt. Renaldo Fiddler, [facility] Acute Rehab Services Pager [phone]

## 2012-03-19 LAB — COMPREHENSIVE METABOLIC PANEL
Albumin: 2.5 g/dL — ABNORMAL LOW (ref 3.5–5.2)
BUN: 86 mg/dL — ABNORMAL HIGH (ref 6–23)
Calcium: 8.2 mg/dL — ABNORMAL LOW (ref 8.4–10.5)
Chloride: 120 mEq/L — ABNORMAL HIGH (ref 96–112)
Creatinine, Ser: 4.73 mg/dL — ABNORMAL HIGH (ref 0.50–1.35)
Total Bilirubin: 0.9 mg/dL (ref 0.3–1.2)
Total Protein: 7.1 g/dL (ref 6.0–8.3)

## 2012-03-19 LAB — GLUCOSE, CAPILLARY
Glucose-Capillary: 124 mg/dL — ABNORMAL HIGH (ref 70–99)
Glucose-Capillary: 135 mg/dL — ABNORMAL HIGH (ref 70–99)

## 2012-03-19 LAB — CBC
HCT: 35.5 % — ABNORMAL LOW (ref 39.0–52.0)
MCH: 26.5 pg (ref 26.0–34.0)
MCHC: 33.2 g/dL (ref 30.0–36.0)
MCV: 79.8 fL (ref 78.0–100.0)
RDW: 17.2 % — ABNORMAL HIGH (ref 11.5–15.5)

## 2012-03-19 LAB — PROTEIN ELECTROPHORESIS, SERUM
Alpha-1-Globulin: 6.5 % — ABNORMAL HIGH (ref 2.9–4.9)
Alpha-2-Globulin: 11.1 % (ref 7.1–11.8)
Beta 2: 7 % — ABNORMAL HIGH (ref 3.2–6.5)
Gamma Globulin: 33.4 % — ABNORMAL HIGH (ref 11.1–18.8)

## 2012-03-19 LAB — PHOSPHORUS: Phosphorus: 4.7 mg/dL — ABNORMAL HIGH (ref 2.3–4.6)

## 2012-03-19 MED ORDER — DEXTROSE 5 % IV SOLN
INTRAVENOUS | Status: DC
  Administered 2012-03-20 – 2012-03-22 (×5): via INTRAVENOUS

## 2012-03-19 NOTE — Progress Notes (Signed)
Subjective:  Afebrile. No chest pain.   Objective:  Vital Signs in the last 24 hours: Temp:  [97.6 F (36.4 C)-98.3 F (36.8 C)] 97.6 F (36.4 C) (10/12 1327) Pulse Rate:  [80-86] 86  (10/12 1327) Cardiac Rhythm:  [-] Normal sinus rhythm (10/12 0913) Resp:  [18-19] 19  (10/12 1327) BP: (125-141)/(50-59) 141/59 mmHg (10/12 1327) SpO2:  [99 %-100 %] 99 % (10/12 1327) Weight:  [108.2 kg (238 lb 8.6 oz)] 108.2 kg (238 lb 8.6 oz) (10/12 8295)  Physical Exam: BP Readings from Last 1 Encounters:  03/19/12 141/59    Wt Readings from Last 1 Encounters:  03/19/12 108.2 kg (238 lb 8.6 oz)    Weight change: 0.4 kg (14.1 oz)  HEENT: [place]/AT, Eyes-Brown, PERL, EOMI, Conjunctiva-Pink, Sclera-Non-icteric Neck: No JVD, No bruit, Trachea midline. Lungs:  Clear, Bilateral. Cardiac:  Regular rhythm, normal S1 and S2, no S3.  Abdomen:  Soft, non-tender. Extremities:  No edema present. No cyanosis. No clubbing. CNS: AxOx3, Cranial nerves grossly intact, moves all 4 extremities. Right handed. Skin: Warm and dry and scaly.   Intake/Output from previous day: 10/11 0701 - 10/12 0700 In: 1060 [P.O.:960; IV Piggyback:100] Out: 725 [Urine:725]    Lab Results: BMET    Component Value Date/Time   NA 151* 03/19/2012 0803   K 4.2 03/19/2012 0803   CL 120* 03/19/2012 0803   CO2 17* 03/19/2012 0803   GLUCOSE 132* 03/19/2012 0803   BUN 86* 03/19/2012 0803   CREATININE 4.73* 03/19/2012 0803   CALCIUM 8.2* 03/19/2012 0803   GFRNONAA 12* 03/19/2012 0803   GFRAA 13* 03/19/2012 0803   CBC    Component Value Date/Time   WBC 11.5* 03/19/2012 0803   RBC 4.45 03/19/2012 0803   HGB 11.8* 03/19/2012 0803   HCT 35.5* 03/19/2012 0803   PLT 155 03/19/2012 0803   MCV 79.8 03/19/2012 0803   MCH 26.5 03/19/2012 0803   MCHC 33.2 03/19/2012 0803   RDW 17.2* 03/19/2012 0803   LYMPHSABS 1.4 03/09/2012 1754   MONOABS 0.4 03/09/2012 1754   EOSABS 3.7* 03/09/2012 1754   BASOSABS 0.1 03/09/2012 1754   CARDIAC  ENZYMES Lab Results  Component Value Date   CKTOTAL 108 08/04/2011   CKMB 5.1* 08/04/2011   TROPONINI <0.30 03/09/2012    Assessment/Plan:  Patient Active Hospital Problem List: Staph aureus bacteremia probably from skin /catheter related sepsis  Progressive Acute on chronic renal insufficiency stage IV, multifactorial  Dehydration  Diabetes mellitus  Hypertension  Nonischemic cardiomyopathy  Compensated systolic/diastolic heart failure Morbid obesity  Status post diarrhea  Hypoalbuminemia  Continue medical treatment Small dose IV fluid   LOS: 10 days    Orpah Cobb  MD  03/19/2012, 9:04 PM

## 2012-03-19 NOTE — Progress Notes (Signed)
PA Student Daily Progress Note [facility] Subjective: No new complaints, rash slowly improving. Creat down first time to 4.73 today  Objective:  Filed Vitals:   03/18/12 1321 03/18/12 2058 03/19/12 0626 03/19/12 1327  BP: 125/54 163/50 125/50 141/59  Pulse: 71 80 80 86  Temp: 97.3 F (36.3 C) 97.4 F (36.3 C) 98.3 F (36.8 C) 97.6 F (36.4 C)  TempSrc: Oral Oral Oral Oral  Resp: 18 18 18 19   Height:      Weight:   108.2 kg (238 lb 8.6 oz)   SpO2: 100% 100% 100% 99%   Physical Exam: General appearance: alert, cooperative, no distress and moderately obese  Lungs: diminished breath sounds bilaterally  Heart: regular rate and rhythm, S1, S2 normal, no murmur, click, rub or gallop  Abdomen: soft, non-tender; bowel sounds normal; no masses, no organomegaly  Extremities: edema 2+ to calves bilaterally, diabetic foot wound on right foot, callused and dry, no pus  Skin: dry, exfoliation over mostly hands and feet  Neurologic: Grossly normal  Assessment/Plan: [name] is a 66 yo AA male with a PMH of CKD stage 2, CHF, HTN, DM2, and hypercholesteremia.   1. Acute on CRF- vol overloaded but didn't tolerate diuretics (inc'd Creat). Initially thought to be due to low BP and poor perfusion. Lasix held yesterday and creat down today. Prob significant 3rd spacing due to allergic drug reaction. Continue off of diuretics. If creat improves significantly we can try diuretics again when fluid has mobilized to vascular space.  2. CHF, diast- normal EF 60%.  Low FeNa suggesting prerenal/low intravasc volume. 3. HTN- pt has been hypotensive on admission, BP meds stopped 4. DM2- Pts most recent HbA1c is 10.5- restart basal insulin and consider short acting before meals  5. Hypoalbuminemia- continue per primary team.  6. MSSA bacteremia- TEE negative. On Ancef.  7. N/V/D- initial presentation, resolved.  8. Hypernatremia- drink water as desired., inc'd fluids to 1800   Justin Moselle  MD [facility] [phone] pgr    [phone] cell 03/19/2012, 2:27 PM    Labs: Basic Metabolic Panel:  Lab 03/19/12 4782 03/18/12 0505 03/17/12 0700 03/16/12 1415  NA 151* 151* 152* --  K 4.2 3.8 3.9 --  CL 120* 118* 117* --  CO2 17* 20 20 --  GLUCOSE 132* 117* 118* --  BUN 86* 91* 83* --  CREATININE 4.73* 5.15* 4.78* --  CALCIUM 8.2* 8.0* 8.1* --  ALB -- -- -- --  PHOS 4.7* -- -- 4.5   Liver Function Tests:  Lab 03/19/12 0803 03/18/12 0505 03/17/12 0700  AST 139* 150* 226*  ALT 44 78* 113*  ALKPHOS 334* 295* 294*  BILITOT 0.9 0.8 0.8  PROT 7.1 6.5 6.3  ALBUMIN 2.5* 2.0* 2.2*   No results found for this basename: LIPASE:3,AMYLASE:3 in the last 168 hours No results found for this basename: AMMONIA:3 in the last 168 hours INR: @resultsinr3 @ CBC:  Lab 03/19/12 0803 03/18/12 0505 03/17/12 0700 03/16/12 0500 03/15/12 0427  WBC 11.5* 11.9* 13.3* -- --  NEUTROABS -- -- -- -- --  HGB 11.8* 11.5* 10.7* -- --  HCT 35.5* 34.6* 33.1* -- --  MCV 79.8 79.4 79.8 80.2 81.0  PLT 155 147* 135* -- --   Blood Culture    Component Value Date/Time   SDES CATH TIP PICC LINE 03/17/2012 1627   SPECREQUEST NONE 03/17/2012 1627   CULT >100 COLONIES STAPHYLOCOCCUS AUREUS 03/17/2012 1627   REPTSTATUS PENDING 03/17/2012 1627  Cardiac Enzymes: No results found for this basename: CKTOTAL:5,CKMB:5,CKMBINDEX:5,TROPONINI:5 in the last 168 hours CBG:  Lab 03/19/12 0801 03/19/12 0355 03/18/12 2358 03/18/12 2017 03/18/12 1612  GLUCAP 124* 77 159* 229* 178*   Iron Studies: No results found for this basename: IRON,TIBC,TRANSFERRIN,FERRITIN in the last 72 hours  Micro Results: Recent Results (from the past 240 hour(s))  CLOSTRIDIUM DIFFICILE BY PCR     Status: Normal   Collection Time   03/09/12  8:30 PM      Component Value Range Status Comment   C difficile by pcr NEGATIVE  NEGATIVE Final   CULTURE, BLOOD (ROUTINE X 2)     Status: Normal   Collection Time    03/13/12 10:45 PM      Component Value Range Status Comment   Specimen Description BLOOD LEFT ARM   Final    Special Requests     Final    Value: BOTTLES DRAWN AEROBIC AND ANAEROBIC 7CC BLUE,5CC RED   Culture  Setup Time 03/14/2012 12:19   Final    Culture     Final    Value: STAPHYLOCOCCUS AUREUS     Note: SUSCEPTIBILITIES PERFORMED ON PREVIOUS CULTURE WITHIN THE LAST 5 DAYS.     Note: CRITICAL RESULT CALLED TO, READ BACK BY AND VERIFIED WITH: MANDISIA AT 2140 03/14/12 BY SNOLO   Report Status 03/16/2012 FINAL   Final   CULTURE, BLOOD (ROUTINE X 2)     Status: Normal   Collection Time   03/13/12 10:45 PM      Component Value Range Status Comment   Specimen Description BLOOD LEFT ARM   Final    Special Requests BOTTLES DRAWN AEROBIC ONLY 5CC   Final    Culture  Setup Time 03/14/2012 12:19   Final    Culture     Final    Value: STAPHYLOCOCCUS AUREUS     Note: RIFAMPIN AND GENTAMICIN SHOULD NOT BE USED AS SINGLE DRUGS FOR TREATMENT OF STAPH INFECTIONS.     Note: CRITICAL RESULT CALLED TO, READ BACK BY AND VERIFIED WITH: MANDISIA AT 2141 03/14/12 BY SNOLO   Report Status 03/16/2012 FINAL   Final    Organism ID, Bacteria STAPHYLOCOCCUS AUREUS   Final   URINE CULTURE     Status: Normal   Collection Time   03/16/12  6:51 PM      Component Value Range Status Comment   Specimen Description URINE, RANDOM   Final    Special Requests NONE   Final    Culture  Setup Time 03/16/2012 19:54   Final    Colony Count NO GROWTH   Final    Culture NO GROWTH   Final    Report Status 03/17/2012 FINAL   Final   CULTURE, BLOOD (ROUTINE X 2)     Status: Normal (Preliminary result)   Collection Time   03/17/12  7:00 AM      Component Value Range Status Comment   Specimen Description BLOOD LEFT HAND   Final    Special Requests BOTTLES DRAWN AEROBIC AND ANAEROBIC 10CC EACH   Final    Culture  Setup Time 03/17/2012 12:45   Final    Culture     Final    Value:        BLOOD CULTURE RECEIVED NO GROWTH TO DATE  CULTURE WILL BE HELD FOR 5 DAYS BEFORE ISSUING A FINAL NEGATIVE REPORT   Report Status PENDING   Incomplete   CULTURE, BLOOD (ROUTINE X 2)  Status: Normal (Preliminary result)   Collection Time   03/17/12  7:00 AM      Component Value Range Status Comment   Specimen Description BLOOD LEFT ARM   Final    Special Requests BOTTLES DRAWN AEROBIC AND ANAEROBIC 10CC EACH   Final    Culture  Setup Time 03/17/2012 12:45   Final    Culture     Final    Value:        BLOOD CULTURE RECEIVED NO GROWTH TO DATE CULTURE WILL BE HELD FOR 5 DAYS BEFORE ISSUING A FINAL NEGATIVE REPORT   Report Status PENDING   Incomplete   CATH TIP CULTURE     Status: Normal (Preliminary result)   Collection Time   03/17/12  4:27 PM      Component Value Range Status Comment   Specimen Description CATH TIP PICC LINE   Final    Special Requests NONE   Final    Culture >100 COLONIES STAPHYLOCOCCUS AUREUS   Final    Report Status PENDING   Incomplete    Studies/Results: No results found. Medications: Scheduled Meds:    . albumin human  50 g Intravenous Once  . aspirin EC  81 mg Oral Daily  .  ceFAZolin (ANCEF) IV  1 g Intravenous Q12H  . heparin  5,000 Units Subcutaneous Q8H  . insulin aspart  0-15 Units Subcutaneous Q4H  . insulin glargine  10 Units Subcutaneous QHS  . pantoprazole  40 mg Oral Q0600  . sodium chloride  500 mL Intravenous Once  . sodium chloride  3 mL Intravenous Q12H   Continuous Infusions:  PRN Meds:.acetaminophen, alum & mag hydroxide-simeth, diphenhydrAMINE, ondansetron (ZOFRAN) IV, ondansetron   This is a Psychologist, occupational Note.  The care of the patient was discussed with Dr. Briant Cedar and the assessment and plan formulated with their assistance.  Please see their attached note for official documentation of the daily encounter.  Justin Boss, MA,  PA-S2 03/19/2012, 2:22 PM Pt seen and examined and note reviewed.  Pt feels better.  I/O's not accurate.  Fena suggest pre-renal state.  Give  another 500cc IV fluids today.  Hopefully renal fx will stabilize in next 1-2 days.  Suspect hematuria related to foley trauma.  Recheck labs in am. Consider derm consult for his skin issue.

## 2012-03-20 LAB — RENAL FUNCTION PANEL
BUN: 85 mg/dL — ABNORMAL HIGH (ref 6–23)
Glucose, Bld: 187 mg/dL — ABNORMAL HIGH (ref 70–99)
Phosphorus: 4.3 mg/dL (ref 2.3–4.6)
Potassium: 4.2 mEq/L (ref 3.5–5.1)

## 2012-03-20 LAB — GLUCOSE, CAPILLARY
Glucose-Capillary: 229 mg/dL — ABNORMAL HIGH (ref 70–99)
Glucose-Capillary: 232 mg/dL — ABNORMAL HIGH (ref 70–99)
Glucose-Capillary: 248 mg/dL — ABNORMAL HIGH (ref 70–99)
Glucose-Capillary: 221 mg/dL — ABNORMAL HIGH (ref 70–99)
Glucose-Capillary: 243 mg/dL — ABNORMAL HIGH (ref 70–99)

## 2012-03-20 LAB — BASIC METABOLIC PANEL
BUN: 86 mg/dL — ABNORMAL HIGH (ref 6–23)
Chloride: 123 mEq/L — ABNORMAL HIGH (ref 96–112)
GFR calc non Af Amer: 12 mL/min — ABNORMAL LOW (ref >90)
Glucose, Bld: 115 mg/dL — ABNORMAL HIGH (ref 70–99)
Potassium: 4.1 mEq/L (ref 3.5–5.1)

## 2012-03-20 MED ORDER — ALBUMIN HUMAN 5 % IV SOLN
12.5000 g | Freq: Once | INTRAVENOUS | Status: AC
  Administered 2012-03-20: 12.5 g via INTRAVENOUS
  Filled 2012-03-20: qty 250

## 2012-03-20 NOTE — Progress Notes (Signed)
OT Cancellation Note  Patient Details Name: [name] MRN: [id number] DOB: 03/31/1946   Cancelled Treatment:    Reason Eval/Treat Not Completed: (RN with pt. and requested to defer OT at this time  Boykin Reaper [phone] 03/20/2012, 3:55 PM

## 2012-03-20 NOTE — Progress Notes (Signed)
[facility] Subjective: No new complaints, getting OOB. Creat not back yet  Objective:  Filed Vitals:   03/19/12 1327 03/19/12 2100 03/19/12 2250 03/20/12 0419  BP: 141/59 128/48 122/58 131/66  Pulse: 86 90 84 80  Temp: 97.6 F (36.4 C) 97.3 F (36.3 C) 97.4 F (36.3 C) 97 F (36.1 C)  TempSrc: Oral Axillary Axillary Axillary  Resp: 19 20 20 18   Height:      Weight:    107.8 kg (237 lb 10.5 oz)  SpO2: 99% 99% 98% 99%   Physical Exam: General appearance: alert, cooperative, no distress and moderately obese  Lungs: diminished breath sounds bilaterally  Heart: regular rate and rhythm, S1, S2 normal, no murmur, click, rub or gallop  Abdomen: soft, non-tender; bowel sounds normal; no masses, no organomegaly  Extremities: edema 2+ to calves bilaterally, diabetic foot wound on right foot, callused and dry, no pus  Skin: dry, exfoliation over mostly hands and feet  Neurologic: Grossly normal  Assessment/Plan: [name] is a 66 yo AA male with a PMH of CKD stage 2, CHF, HTN, DM2, and hypercholesteremia.   1. Acute on CRF- vol overloaded but didn't tolerate diuretics (inc'd Creat). Initially thought to be due to low BP and poor perfusion. Lasix held yesterday and creat down today. Prob significant 3rd spacing due to allergic drug reaction and prior sepsis. Continue off of diuretics. If creat improves significantly we can try diuretics again when fluid has mobilized to vascular space. Labs pend today. 2. CHF, diast- normal EF 60%.  Low FeNa suggesting prerenal/low intravasc volume. 3. HTN- pt has been hypotensive on admission, BP meds stopped 4. DM2- Pts most recent HbA1c is 10.5- restart basal insulin and consider short acting before meals  5. Hypoalbuminemia- continue per primary team.  6. MSSA bacteremia- TEE negative. On Ancef.  7. N/V/D- initial presentation, resolved.  8. Hypernatremia- started on D5W per primary at 50/hr, lab pend   Vinson Moselle  MD [facility] [phone] pgr    [phone] cell 03/20/2012, 9:25 AM    Labs: Basic Metabolic Panel:  Lab 03/19/12 4782 03/18/12 0505 03/17/12 0700 03/16/12 1415  NA 151* 151* 152* --  K 4.2 3.8 3.9 --  CL 120* 118* 117* --  CO2 17* 20 20 --  GLUCOSE 132* 117* 118* --  BUN 86* 91* 83* --  CREATININE 4.73* 5.15* 4.78* --  CALCIUM 8.2* 8.0* 8.1* --  ALB -- -- -- --  PHOS 4.7* -- -- 4.5   Liver Function Tests:  Lab 03/19/12 0803 03/18/12 0505 03/17/12 0700  AST 139* 150* 226*  ALT 44 78* 113*  ALKPHOS 334* 295* 294*  BILITOT 0.9 0.8 0.8  PROT 7.1 6.5 6.3  ALBUMIN 2.5* 2.0* 2.2*   No results found for this basename: LIPASE:3,AMYLASE:3 in the last 168 hours No results found for this basename: AMMONIA:3 in the last 168 hours INR: @resultsinr3 @ CBC:  Lab 03/19/12 0803 03/18/12 0505 03/17/12 0700 03/16/12 0500 03/15/12 0427  WBC 11.5* 11.9* 13.3* -- --  NEUTROABS -- -- -- -- --  HGB 11.8* 11.5* 10.7* -- --  HCT 35.5* 34.6* 33.1* -- --  MCV 79.8 79.4 79.8 80.2 81.0  PLT 155 147* 135* -- --   Blood Culture    Component Value Date/Time   SDES CATH TIP PICC LINE 03/17/2012 1627   SPECREQUEST NONE 03/17/2012 1627   CULT >100 COLONIES STAPHYLOCOCCUS AUREUS 03/17/2012 1627   REPTSTATUS PENDING 03/17/2012 1627    Cardiac  Enzymes: No results found for this basename: CKTOTAL:5,CKMB:5,CKMBINDEX:5,TROPONINI:5 in the last 168 hours CBG:  Lab 03/20/12 0735 03/20/12 0422 03/20/12 0128 03/19/12 2344 03/19/12 2103  GLUCAP 130* 168* 229* 232* 248*   Iron Studies: No results found for this basename: IRON,TIBC,TRANSFERRIN,FERRITIN in the last 72 hours  Micro Results: Recent Results (from the past 240 hour(s))  CULTURE, BLOOD (ROUTINE X 2)     Status: Normal   Collection Time   03/13/12 10:45 PM      Component Value Range Status Comment   Specimen Description BLOOD LEFT ARM   Final    Special Requests     Final    Value: BOTTLES DRAWN AEROBIC AND ANAEROBIC 7CC BLUE,5CC RED    Culture  Setup Time 03/14/2012 12:19   Final    Culture     Final    Value: STAPHYLOCOCCUS AUREUS     Note: SUSCEPTIBILITIES PERFORMED ON PREVIOUS CULTURE WITHIN THE LAST 5 DAYS.     Note: CRITICAL RESULT CALLED TO, READ BACK BY AND VERIFIED WITH: MANDISIA AT 2140 03/14/12 BY SNOLO   Report Status 03/16/2012 FINAL   Final   CULTURE, BLOOD (ROUTINE X 2)     Status: Normal   Collection Time   03/13/12 10:45 PM      Component Value Range Status Comment   Specimen Description BLOOD LEFT ARM   Final    Special Requests BOTTLES DRAWN AEROBIC ONLY 5CC   Final    Culture  Setup Time 03/14/2012 12:19   Final    Culture     Final    Value: STAPHYLOCOCCUS AUREUS     Note: RIFAMPIN AND GENTAMICIN SHOULD NOT BE USED AS SINGLE DRUGS FOR TREATMENT OF STAPH INFECTIONS.     Note: CRITICAL RESULT CALLED TO, READ BACK BY AND VERIFIED WITH: MANDISIA AT 2141 03/14/12 BY SNOLO   Report Status 03/16/2012 FINAL   Final    Organism ID, Bacteria STAPHYLOCOCCUS AUREUS   Final   URINE CULTURE     Status: Normal   Collection Time   03/16/12  6:51 PM      Component Value Range Status Comment   Specimen Description URINE, RANDOM   Final    Special Requests NONE   Final    Culture  Setup Time 03/16/2012 19:54   Final    Colony Count NO GROWTH   Final    Culture NO GROWTH   Final    Report Status 03/17/2012 FINAL   Final   CULTURE, BLOOD (ROUTINE X 2)     Status: Normal (Preliminary result)   Collection Time   03/17/12  7:00 AM      Component Value Range Status Comment   Specimen Description BLOOD LEFT HAND   Final    Special Requests BOTTLES DRAWN AEROBIC AND ANAEROBIC 10CC EACH   Final    Culture  Setup Time 03/17/2012 12:45   Final    Culture     Final    Value:        BLOOD CULTURE RECEIVED NO GROWTH TO DATE CULTURE WILL BE HELD FOR 5 DAYS BEFORE ISSUING A FINAL NEGATIVE REPORT   Report Status PENDING   Incomplete   CULTURE, BLOOD (ROUTINE X 2)     Status: Normal (Preliminary result)   Collection Time    03/17/12  7:00 AM      Component Value Range Status Comment   Specimen Description BLOOD LEFT ARM   Final    Special Requests BOTTLES DRAWN AEROBIC  AND ANAEROBIC 10CC EACH   Final    Culture  Setup Time 03/17/2012 12:45   Final    Culture     Final    Value:        BLOOD CULTURE RECEIVED NO GROWTH TO DATE CULTURE WILL BE HELD FOR 5 DAYS BEFORE ISSUING A FINAL NEGATIVE REPORT   Report Status PENDING   Incomplete   CATH TIP CULTURE     Status: Normal (Preliminary result)   Collection Time   03/17/12  4:27 PM      Component Value Range Status Comment   Specimen Description CATH TIP PICC LINE   Final    Special Requests NONE   Final    Culture >100 COLONIES STAPHYLOCOCCUS AUREUS   Final    Report Status PENDING   Incomplete    Studies/Results: No results found. Medications: Scheduled Meds:    . aspirin EC  81 mg Oral Daily  .  ceFAZolin (ANCEF) IV  1 g Intravenous Q12H  . heparin  5,000 Units Subcutaneous Q8H  . insulin aspart  0-15 Units Subcutaneous Q4H  . insulin glargine  10 Units Subcutaneous QHS  . pantoprazole  40 mg Oral Q0600  . sodium chloride  3 mL Intravenous Q12H   Continuous Infusions:    . dextrose 50 mL/hr at 03/20/12 0031   PRN Meds:.acetaminophen, alum & mag hydroxide-simeth, diphenhydrAMINE, ondansetron (ZOFRAN) IV, ondansetron   This is a Psychologist, occupational Note.  The care of the patient was discussed with Dr. Briant Cedar and the assessment and plan formulated with their assistance.  Please see their attached note for official documentation of the daily encounter.  Aniceto Boss, MA,  PA-S2 03/20/2012, 9:25 AM Pt seen and examined and note reviewed.  Pt feels better.  I/O's not accurate.  Fena suggest pre-renal state.  Give another 500cc IV fluids today.  Hopefully renal fx will stabilize in next 1-2 days.  Suspect hematuria related to foley trauma.  Recheck labs in am. Consider derm consult for his skin issue.

## 2012-03-20 NOTE — Progress Notes (Signed)
Subjective:  Feeling better but hypernatremia persist. Afebrile.  Objective:  Vital Signs in the last 24 hours: Temp:  [97 F (36.1 C)-97.6 F (36.4 C)] 97 F (36.1 C) (10/13 0419) Pulse Rate:  [80-90] 80  (10/13 0419) Cardiac Rhythm:  [-] Normal sinus rhythm (10/13 0834) Resp:  [18-20] 18  (10/13 0419) BP: (122-141)/(48-66) 131/66 mmHg (10/13 0419) SpO2:  [98 %-99 %] 99 % (10/13 0419) Weight:  [107.8 kg (237 lb 10.5 oz)] 107.8 kg (237 lb 10.5 oz) (10/13 0419)  Physical Exam: BP Readings from Last 1 Encounters:  03/20/12 131/66    Wt Readings from Last 1 Encounters:  03/20/12 107.8 kg (237 lb 10.5 oz)    Weight change: -0.4 kg (-14.1 oz)  HEENT: [place]/AT, Eyes-Brown, PERL, EOMI, Conjunctiva-Pink, Sclera-Non-icteric Neck: No JVD, No bruit, Trachea midline. Lungs:  Clear, Bilateral. Cardiac:  Regular rhythm, normal S1 and S2, no S3.  Abdomen:  Soft, non-tender. Extremities:  1 + edema present. No cyanosis. No clubbing. CNS: AxOx3, Cranial nerves grossly intact, moves all 4 extremities. Right handed. Skin: Warm and dry and scaly.   Intake/Output from previous day: 10/12 0701 - 10/13 0700 In: 640 [P.O.:640] Out: 1150 [Urine:1150]    Lab Results: BMET    Component Value Date/Time   NA 155* 03/20/2012 0910   K 4.1 03/20/2012 0910   CL 123* 03/20/2012 0910   CO2 18* 03/20/2012 0910   GLUCOSE 115* 03/20/2012 0910   BUN 86* 03/20/2012 0910   CREATININE 4.59* 03/20/2012 0910   CALCIUM 8.2* 03/20/2012 0910   GFRNONAA 12* 03/20/2012 0910   GFRAA 14* 03/20/2012 0910   CBC    Component Value Date/Time   WBC 11.5* 03/19/2012 0803   RBC 4.45 03/19/2012 0803   HGB 11.8* 03/19/2012 0803   HCT 35.5* 03/19/2012 0803   PLT 155 03/19/2012 0803   MCV 79.8 03/19/2012 0803   MCH 26.5 03/19/2012 0803   MCHC 33.2 03/19/2012 0803   RDW 17.2* 03/19/2012 0803   LYMPHSABS 1.4 03/09/2012 1754   MONOABS 0.4 03/09/2012 1754   EOSABS 3.7* 03/09/2012 1754   BASOSABS 0.1 03/09/2012 1754    CARDIAC ENZYMES Lab Results  Component Value Date   CKTOTAL 108 08/04/2011   CKMB 5.1* 08/04/2011   TROPONINI <0.30 03/09/2012    Assessment/Plan:  Patient Active Hospital Problem List: Staph aureus bacteremia probably from skin /catheter related sepsis  Progressive Acute on chronic renal insufficiency stage IV, multifactorial  Dehydration  Diabetes mellitus  Hypertension  Nonischemic cardiomyopathy  Compensated systolic/diastolic heart failure  Morbid obesity  Status post diarrhea  Hypoalbuminemia  IV albumin with IV fluids.   LOS: 11 days    Justin Cobb  MD  03/20/2012, 12:18 PM

## 2012-03-21 LAB — RENAL FUNCTION PANEL
Albumin: 2.3 g/dL — ABNORMAL LOW (ref 3.5–5.2)
BUN: 82 mg/dL — ABNORMAL HIGH (ref 6–23)
Chloride: 123 mEq/L — ABNORMAL HIGH (ref 96–112)
GFR calc Af Amer: 15 mL/min — ABNORMAL LOW (ref >90)
GFR calc non Af Amer: 13 mL/min — ABNORMAL LOW (ref >90)
Potassium: 4.1 mEq/L (ref 3.5–5.1)
Sodium: 155 mEq/L — ABNORMAL HIGH (ref 135–145)

## 2012-03-21 LAB — CATH TIP CULTURE: Culture: >100

## 2012-03-21 LAB — GLUCOSE, CAPILLARY
Glucose-Capillary: 156 mg/dL — ABNORMAL HIGH (ref 70–99)
Glucose-Capillary: 105 mg/dL — ABNORMAL HIGH (ref 70–99)
Glucose-Capillary: 133 mg/dL — ABNORMAL HIGH (ref 70–99)
Glucose-Capillary: 185 mg/dL — ABNORMAL HIGH (ref 70–99)

## 2012-03-21 NOTE — Progress Notes (Signed)
Occupational Therapy Treatment Patient Details Name: Gaelan Glennon MRN: [id number] DOB: 1945/06/28 Today's Date: 03/21/2012 Time: 4098-1191 OT Time Calculation (min): 41 min  OT Assessment / Plan / Recommendation Comments on Treatment Session Pt requires max encouragement to participate. Once out of bed, pt participated in grooming and bathing task at sink. Required 2 rest breaks in 10 min standing time. Required Max cues cues to attempt ADL himself instead of asking his wife to do tasks. Wife educated on need to allow pt to do as much as possible to increase strength and endurance.    Follow Up Recommendations  Skilled nursing facility    Barriers to Discharge       Equipment Recommendations  3 in 1 bedside comode;Hospital bed    Recommendations for Other Services    Frequency Min 2X/week   Plan Discharge plan remains appropriate    Precautions / Restrictions Precautions Precautions: Fall Restrictions Weight Bearing Restrictions: No   Pertinent Vitals/Pain No c/o pain    ADL  Grooming: Performed;Set up Where Assessed - Grooming: Unsupported sitting Upper Body Bathing: Performed;Moderate assistance Where Assessed - Upper Body Bathing: Supported sitting Lower Body Bathing: Performed;Maximal assistance Where Assessed - Lower Body Bathing: Supported sit to stand Upper Body Dressing: Performed;Moderate assistance Where Assessed - Upper Body Dressing: Supported sitting Equipment Used: Rolling walker;Gait belt Transfers/Ambulation Related to ADLs: Overall +2 ambulation with RW. Max vc for safety and sequencing. ADL Comments: decreased performance due to fatigue    OT Diagnosis:    OT Problem List:   OT Treatment Interventions:     OT Goals Acute Rehab OT Goals OT Goal Formulation: With patient Time For Goal Achievement: 03/28/12 Potential to Achieve Goals: Good ADL Goals Pt Will Perform Grooming: with supervision;Standing at sink;Unsupported;with cueing (comment type  and amount) ADL Goal: Grooming - Progress: Progressing toward goals Pt Will Perform Upper Body Bathing: Standing at sink;with supervision;Unsupported;with cueing (comment type and amount) ADL Goal: Upper Body Bathing - Progress: Progressing toward goals Pt Will Perform Lower Body Bathing: with min assist;Sit to stand from chair;Unsupported;with adaptive equipment;with cueing (comment type and amount) ADL Goal: Lower Body Bathing - Progress: Progressing toward goals Pt Will Perform Lower Body Dressing: with min assist;Unsupported;with adaptive equipment;with cueing (comment type and amount);Sit to stand from chair ADL Goal: Lower Body Dressing - Progress: Progressing toward goals Pt Will Transfer to Toilet: with supervision;3-in-1;Ambulation;with cueing (comment type and amount);with DME Pt Will Perform Toileting - Clothing Manipulation: with supervision;Standing;with cueing (comment type and amount) Pt Will Perform Toileting - Hygiene: with supervision;Sit to stand from 3-in-1/toilet;Standing at 3-in-1/toilet Pt Will Perform Tub/Shower Transfer: Shower transfer;with caregiver independent in assisting;with min assist;with DME;Ambulation;Shower seat with back Arm Goals Pt Will Complete Theraband Exer: with supervision, verbal cues required/provided;to increase strength;Bilateral upper extremities;2 sets;Level 2 Theraband Arm Goal: Theraband Exercises - Progress: Progressing toward goal  Visit Information  Last OT Received On: 03/21/12 Assistance Needed: +2    Subjective Data      Prior Functioning       Cognition  Overall Cognitive Status: Impaired Area of Impairment: Safety/judgement;Awareness of deficits;Problem solving;Awareness of errors Arousal/Alertness: Lethargic Orientation Level: Appears intact for tasks assessed Behavior During Session: Flat affect Safety/Judgement: Decreased safety judgement for tasks assessed;Decreased awareness of safety precautions Safety/Judgement -  Other Comments: running into wall Awareness of Errors: Assistance required to identify errors made;Assistance required to correct errors made Problem Solving: max cues for funcitonal basic Cognition - Other Comments: appears unmotivated at times    Mobility  Shoulder Instructions  Bed Mobility Bed Mobility: Supine to Sit;Sitting - Scoot to Edge of Bed Supine to Sit: 1: +1 Total assist;With rails;HOB elevated Sitting - Scoot to Edge of Bed: 2: Max assist Transfers Transfers: Sit to Stand;Stand to Sit Sit to Stand: 3: Mod assist;With upper extremity assist;From chair/3-in-1 Sit to Stand: Patient Percentage: 70% Stand to Sit: 4: Min assist;With upper extremity assist;To chair/3-in-1 Details for Transfer Assistance: vc for participation and safe sequencing.       Exercises  General Exercises - Upper Extremity Shoulder Flexion: Strengthening;Both;10 reps;Seated;Theraband Theraband Level (Shoulder Flexion): Level 2 (Red) Shoulder Extension: Strengthening;Both;10 reps;Seated Shoulder ABduction: Strengthening;Both;10 reps;Theraband Theraband Level (Shoulder Abduction): Level 2 (Red) Shoulder ADduction: Strengthening;Both;10 reps;Seated;Theraband Theraband Level (Shoulder Adduction): Level 2 (Red)   Balance  Min A   End of Session OT - End of Session Equipment Utilized During Treatment: Gait belt Activity Tolerance: Patient limited by fatigue Patient left: in chair;with call bell/phone within reach;with family/visitor present Nurse Communication: Mobility status  GO     Finlee Milo,HILLARY 03/21/2012, 2:31 PM [facility], OTR/L  [phone] 03/21/2012

## 2012-03-21 NOTE — Progress Notes (Signed)
Physical Therapy Treatment Patient Details Name: [name] MRN: [id number] DOB: 09-May-1946 Today's Date: 03/21/2012 Time: 4098-1191 PT Time Calculation (min): 42 min  PT Assessment / Plan / Recommendation Comments on Treatment Session  Pt increased ambulation distance today but required increased assist for mobility compared to previous PT sessions.  At this date, due to increased weakness pt would benefit from SNF prior to d/c home.  Pt's wife present entire session & assisted with encouraging pt with increased participation & participation entire session.      Follow Up Recommendations  Post acute inpatient     Does the patient have the potential to tolerate intense rehabilitation  No, Recommend SNF  Barriers to Discharge        Equipment Recommendations  3 in 1 bedside comode;Hospital bed    Recommendations for Other Services    Frequency Min 3X/week   Plan Discharge plan needs to be updated    Precautions / Restrictions Precautions Precautions: Fall Restrictions Weight Bearing Restrictions: No    Pertinent Vitals/Pain C/o sacral pain due to sacral sores.  Nsing is aware of ulcers.  Changed bandaging on sores.       Mobility  Bed Mobility Bed Mobility: Supine to Sit;Sitting - Scoot to Edge of Bed Supine to Sit: 1: +1 Total assist;With rails;HOB elevated Sitting - Scoot to Edge of Bed: 1: +1 Total assist Details for Bed Mobility Assistance: Pt encouragement to increase active participation & perform as independently as possible before automatically asking for (A).  Max cues for sequencing & technique.   Transfers Transfers: Sit to Stand;Stand to Sit Sit to Stand: 1: +2 Total assist;With armrests;From bed;From chair/3-in-1;With upper extremity assist Sit to Stand: Patient Percentage: 70% Stand to Sit: 4: Min assist;With armrests;With upper extremity assist;To chair/3-in-1 Details for Transfer Assistance: Pt required increased assist to achieve standing from recliner  compared to bed due to bed elevated.  Cues for hand placement & technique.   Ambulation/Gait Ambulation/Gait Assistance: 1: +2 Total assist Ambulation/Gait: Patient Percentage: 80% Ambulation Distance (Feet): 140 Feet Assistive device: Rolling walker Ambulation/Gait Assistance Details: Max encouragement throughout as well as max cueing for increased safety awareness & safe use of RW.  Pt running into objects/wall on Rt side & tends to keep feet on outside of RW during turns.  Assist for balance, RW management, & safety.   Gait Pattern: Step-through pattern;Decreased stride length;Trunk flexed;Shuffle Gait velocity: decreased Stairs: No Wheelchair Mobility Wheelchair Mobility: No         PT Goals Acute Rehab PT Goals Time For Goal Achievement: 03/19/12 Potential to Achieve Goals: Good Pt will go Supine/Side to Sit: with modified independence PT Goal: Supine/Side to Sit - Progress: Not progressing Pt will go Sit to Supine/Side: with modified independence Pt will go Sit to Stand: with modified independence PT Goal: Sit to Stand - Progress: Not progressing Pt will go Stand to Sit: with modified independence PT Goal: Stand to Sit - Progress: Not progressing Pt will Ambulate: >150 feet;with modified independence;with least restrictive assistive device PT Goal: Ambulate - Progress: Other (comment) (progressing with distance but not (A) level)  Visit Information  Last PT Received On: 03/21/12 Assistance Needed: +2    Subjective Data  Subjective: "Just let me get back into bed"   Cognition  Overall Cognitive Status: Impaired Area of Impairment: Safety/judgement;Awareness of deficits;Problem solving;Awareness of errors Arousal/Alertness: Lethargic Orientation Level: Appears intact for tasks assessed Behavior During Session: Flat affect Safety/Judgement: Decreased awareness of safety precautions;Decreased safety judgement for tasks assessed;Decreased  awareness of need for  assistance Safety/Judgement - Other Comments: Pt required max cueing for increased safety awareness with ambulation due to pt running into objects  Awareness of Errors: Assistance required to identify errors made;Assistance required to correct errors made Problem Solving: Max cueing for supine>sit.  Pt immediatly requesting assistance & wanting therapist & wife to pull him to sitting upright.   Cognition - Other Comments: Pt required strong motivation entire session.  Self-limiting.      Balance  Balance Balance Assessed: Yes (See OT note for balance assessment. )  End of Session PT - End of Session Equipment Utilized During Treatment: Gait belt Activity Tolerance: Patient limited by fatigue;Other (comment) (self-limiting) Patient left: in chair;with call bell/phone within reach;with family/visitor present Nurse Communication: Mobility status    Verdell Face, [place] [phone] 03/21/2012

## 2012-03-21 NOTE — Progress Notes (Signed)
Subjective:  Denies any chest pain or shortness of breath. States feels better complains of itching. Denies any fever or chills  Objective:  Vital Signs in the last 24 hours: Temp:  [97.9 F (36.6 C)-98.4 F (36.9 C)] 97.9 F (36.6 C) (10/14 0622) Pulse Rate:  [84-91] 84  (10/14 0622) Resp:  [16-18] 18  (10/14 0622) BP: (111-128)/(52-76) 111/56 mmHg (10/14 0622) SpO2:  [99 %-100 %] 99 % (10/14 0622) Weight:  [107.6 kg (237 lb 3.4 oz)] 107.6 kg (237 lb 3.4 oz) (10/14 0622)  Intake/Output from previous day: 10/13 0701 - 10/14 0700 In: 800 [P.O.:800] Out: 825 [Urine:825] Intake/Output from this shift:    Physical Exam: Neck: no adenopathy, no carotid bruit, no JVD and supple, symmetrical, trachea midline Lungs: Decreased breath sound at bases Heart: regular rate and rhythm, S1, S2 normal and Soft systolic murmur noted Abdomen: soft, non-tender; bowel sounds normal; no masses,  no organomegaly Extremities: No clubbing cyanosis 1+ edema noted Skin: Diffuse exfoliative skin lesions noted no erythema.   Lab Results:  [facility] 03/19/12 0803  WBC 11.5*  HGB 11.8*  PLT 155    Basename 03/21/12 0520 03/20/12 0910  NA 155* 155*  K 4.1 4.1  CL 123* 123*  CO2 18* 18*  GLUCOSE 113* 115*  BUN 82* 86*  CREATININE 4.34* 4.59*   No results found for this basename: TROPONINI:2,CK,MB:2 in the last 72 hours Hepatic Function Panel  Basename 03/21/12 0520 03/19/12 0803  PROT -- 7.1  ALBUMIN 2.3* --  AST -- 139*  ALT -- 44  ALKPHOS -- 334*  BILITOT -- 0.9  BILIDIR -- --  IBILI -- --   No results found for this basename: CHOL in the last 72 hours No results found for this basename: PROTIME in the last 72 hours  Imaging: Imaging results have been reviewed and No results found.  Cardiac Studies:  Assessment/Plan:  Staph aureus bacteremia probably from skin /catheter related sepsis  Progressive Acute on chronic renal insufficiency stage IV multifactorial slowly  improving Status post hypotension secondary to medication  dehydration  Diabetes mellitus  Hypertension  Nonischemic cardiomyopathy  Compensated systolic/diastolic heart failure heart failure  Morbid obesity  Hypoalbuminemia Exfoliative dermatitis Plan Continue present management Dermatologic consult called Check labs in a.m.  LOS: 12 days    [name] 03/21/2012, 12:29 PM

## 2012-03-21 NOTE — Progress Notes (Signed)
PA Student Daily Progress Note [facility] Subjective: Pt is resting comfortably. Denies CP, SOB, dizziness, and abd pain. No new complaints.   Objective:  Filed Vitals:   03/20/12 0419 03/20/12 1451 03/20/12 2015 03/21/12 0622  BP: 131/66 128/76 116/52 111/56  Pulse: 80 91 84 84  Temp: 97 F (36.1 C) 98.3 F (36.8 C) 98.4 F (36.9 C) 97.9 F (36.6 C)  TempSrc: Axillary Axillary Axillary Oral  Resp: 18 18 16 18   Height:      Weight: 107.8 kg (237 lb 10.5 oz)   107.6 kg (237 lb 3.4 oz)  SpO2: 99% 100% 99% 99%  I/O last 3 completed shifts: In: 1020 [P.O.:1020] Out: 1975 [Urine:1975]   Physical Exam: General appearance: resting, no distress and moderately obese  Lungs: diminished breath sounds bilaterally  Heart: regular rate and rhythm, S1, S2 normal, no murmur, click, rub or gallop  Abdomen: soft, non-tender; bowel sounds normal; no masses, no organomegaly  Extremities: edema 2+ to calves bilaterally, diabetic foot wound on right foot, callused and dry, no pus  Skin: dry, exfoliation over entire body/arms/legs/face; skin breakdown on buttocks; foot ulcer right foot Neurologic: Grossly normal Cloudy urine from foley catheter  Assessment/Plan: [name] is a 66 yo AA male with a PMH of CKD stage 2, CHF, HTN, DM2, and hypercholesteremia.  1. Acute on CRF- vol overloaded but didn't tolerate diuretics (inc'd Creat). Initially thought to be due to low BP and poor perfusion. Lasix on hold and creat down a little more today. Prob significant 3rd spacing due to allergic drug reaction and prior sepsis. Continue off of diuretics. Cr. Is 4.34 today which is down from yesterday 4.59.  2. CHF, diast- normal EF 60%. Low FeNa suggesting prerenal/low intravasc volume at time of initial presentation.  3. HTN- pt has been hypotensive on admission, BP meds stopped Normotensive at present 4. DM2- Pts most recent HbA1c is 10.5- restart basal insulin and consider short acting before  meals  5. Hypoalbuminemia- continue per primary team.  6. MSSA bacteremia- TEE negative. On Ancef.  7. N/V/D- initial presentation, resolved.  8. Hypernatremia- started on D5W started 10/13  at 50/hr, Is stable today at 155- will continue to monitor.Increase D5W to 75 cc/hour; ad lib po fluids for relative water deficit 9. Diffuse exfoliative skin rash - on no specific therapy - etiology not clear ??? Drug reaction  Would strongly recommend having dermatology evaluate the patient  This is a Psychologist, occupational Note.  The care of the patient was discussed with Dr. Eliott Nine and the assessment and plan formulated with their assistance.  Please see their attached note for official documentation of the daily encounter. I have seen and examined this patient and agree with plan as above with highlighted additions.  Renal function is continuing to slowly improve.  Remains hypernatremic with free water deficit.  Increase D5W to 75/allow ad lib po fluids.  Would strongly recommend a dermatology evaluation for his diffuse skin eruption. [name] B,MD 03/21/2012 11:07 AM   Labs: Basic Metabolic Panel:  Lab 03/21/12 5621 03/20/12 0910 03/20/12 0907 03/19/12 0803  NA 155* 155* 155* --  K 4.1 4.1 4.2 --  CL 123* 123* 123* --  CO2 18* 18* 18* --  GLUCOSE 113* 115* 187* --  BUN 82* 86* 85* --  CREATININE 4.34* 4.59* 4.57* --  CALCIUM 8.0* 8.2* 8.1* --  ALB -- -- -- --  PHOS 4.1 -- 4.3 4.7*   Liver Function Tests:  Lab 03/21/12 3086  03/20/12 0907 03/19/12 0803 03/18/12 0505 03/17/12 0700  AST -- -- 139* 150* 226*  ALT -- -- 44 78* 113*  ALKPHOS -- -- 334* 295* 294*  BILITOT -- -- 0.9 0.8 0.8  PROT -- -- 7.1 6.5 6.3  ALBUMIN 2.3* 2.3* 2.5* -- --   No results found for this basename: LIPASE:3,AMYLASE:3 in the last 168 hours No results found for this basename: AMMONIA:3 in the last 168 hours INR: @resultsinr3 @ CBC:  Lab 03/19/12 0803 03/18/12 0505 03/17/12 0700 03/16/12 0500 03/15/12 0427    WBC 11.5* 11.9* 13.3* -- --  NEUTROABS -- -- -- -- --  HGB 11.8* 11.5* 10.7* -- --  HCT 35.5* 34.6* 33.1* -- --  MCV 79.8 79.4 79.8 80.2 81.0  PLT 155 147* 135* -- --   Blood Culture    Component Value Date/Time   SDES CATH TIP PICC LINE 03/17/2012 1627   SPECREQUEST NONE 03/17/2012 1627   CULT  Value: >100 COLONIES STAPHYLOCOCCUS AUREUS Note: RIFAMPIN AND GENTAMICIN SHOULD NOT BE USED AS SINGLE DRUGS FOR TREATMENT OF STAPH INFECTIONS. 03/17/2012 1627   REPTSTATUS PENDING 03/17/2012 1627    Cardiac Enzymes: No results found for this basename: CKTOTAL:5,CKMB:5,CKMBINDEX:5,TROPONINI:5 in the last 168 hours CBG:  Lab 03/21/12 0408 03/21/12 0119 03/20/12 2206 03/20/12 2022 03/20/12 1703  GLUCAP 105* 156* 175* 207* 243*   Iron Studies: No results found for this basename: IRON,TIBC,TRANSFERRIN,FERRITIN in the last 72 hours  Micro Results: Recent Results (from the past 240 hour(s))  CULTURE, BLOOD (ROUTINE X 2)     Status: Normal   Collection Time   03/13/12 10:45 PM      Component Value Range Status Comment   Specimen Description BLOOD LEFT ARM   Final    Special Requests     Final    Value: BOTTLES DRAWN AEROBIC AND ANAEROBIC 7CC BLUE,5CC RED   Culture  Setup Time 03/14/2012 12:19   Final    Culture     Final    Value: STAPHYLOCOCCUS AUREUS     Note: SUSCEPTIBILITIES PERFORMED ON PREVIOUS CULTURE WITHIN THE LAST 5 DAYS.     Note: CRITICAL RESULT CALLED TO, READ BACK BY AND VERIFIED WITH: MANDISIA AT 2140 03/14/12 BY SNOLO   Report Status 03/16/2012 FINAL   Final   CULTURE, BLOOD (ROUTINE X 2)     Status: Normal   Collection Time   03/13/12 10:45 PM      Component Value Range Status Comment   Specimen Description BLOOD LEFT ARM   Final    Special Requests BOTTLES DRAWN AEROBIC ONLY 5CC   Final    Culture  Setup Time 03/14/2012 12:19   Final    Culture     Final    Value: STAPHYLOCOCCUS AUREUS     Note: RIFAMPIN AND GENTAMICIN SHOULD NOT BE USED AS SINGLE DRUGS FOR TREATMENT  OF STAPH INFECTIONS.     Note: CRITICAL RESULT CALLED TO, READ BACK BY AND VERIFIED WITH: MANDISIA AT 2141 03/14/12 BY SNOLO   Report Status 03/16/2012 FINAL   Final    Organism ID, Bacteria STAPHYLOCOCCUS AUREUS   Final   URINE CULTURE     Status: Normal   Collection Time   03/16/12  6:51 PM      Component Value Range Status Comment   Specimen Description URINE, RANDOM   Final    Special Requests NONE   Final    Culture  Setup Time 03/16/2012 19:54   Final    Colony Count NO  GROWTH   Final    Culture NO GROWTH   Final    Report Status 03/17/2012 FINAL   Final   CULTURE, BLOOD (ROUTINE X 2)     Status: Normal (Preliminary result)   Collection Time   03/17/12  7:00 AM      Component Value Range Status Comment   Specimen Description BLOOD LEFT HAND   Final    Special Requests BOTTLES DRAWN AEROBIC AND ANAEROBIC 10CC EACH   Final    Culture  Setup Time 03/17/2012 12:45   Final    Culture     Final    Value:        BLOOD CULTURE RECEIVED NO GROWTH TO DATE CULTURE WILL BE HELD FOR 5 DAYS BEFORE ISSUING A FINAL NEGATIVE REPORT   Report Status PENDING   Incomplete   CULTURE, BLOOD (ROUTINE X 2)     Status: Normal (Preliminary result)   Collection Time   03/17/12  7:00 AM      Component Value Range Status Comment   Specimen Description BLOOD LEFT ARM   Final    Special Requests BOTTLES DRAWN AEROBIC AND ANAEROBIC 10CC EACH   Final    Culture  Setup Time 03/17/2012 12:45   Final    Culture     Final    Value:        BLOOD CULTURE RECEIVED NO GROWTH TO DATE CULTURE WILL BE HELD FOR 5 DAYS BEFORE ISSUING A FINAL NEGATIVE REPORT   Report Status PENDING   Incomplete   CATH TIP CULTURE     Status: Normal (Preliminary result)   Collection Time   03/17/12  4:27 PM      Component Value Range Status Comment   Specimen Description CATH TIP PICC LINE   Final    Special Requests NONE   Final    Culture     Final    Value: >100 COLONIES STAPHYLOCOCCUS AUREUS     Note: RIFAMPIN AND GENTAMICIN SHOULD  NOT BE USED AS SINGLE DRUGS FOR TREATMENT OF STAPH INFECTIONS.   Report Status PENDING   Incomplete    Studies/Results: No results found. Medications: Scheduled Meds:   . albumin human  12.5 g Intravenous Once  . aspirin EC  81 mg Oral Daily  .  ceFAZolin (ANCEF) IV  1 g Intravenous Q12H  . heparin  5,000 Units Subcutaneous Q8H  . insulin aspart  0-15 Units Subcutaneous Q4H  . insulin glargine  10 Units Subcutaneous QHS  . pantoprazole  40 mg Oral Q0600  . sodium chloride  3 mL Intravenous Q12H   Continuous Infusions:   . dextrose 50 mL/hr at 03/20/12 2159   PRN Meds:.acetaminophen, alum & mag hydroxide-simeth, diphenhydrAMINE, ondansetron (ZOFRAN) IV, ondansetron  V. [place], [place], PA-S2 03/21/2012, 8:51 AM

## 2012-03-22 LAB — RENAL FUNCTION PANEL
Albumin: 1.9 g/dL — ABNORMAL LOW (ref 3.5–5.2)
BUN: 83 mg/dL — ABNORMAL HIGH (ref 6–23)
CO2: 18 meq/L — ABNORMAL LOW (ref 19–32)
Calcium: 7.7 mg/dL — ABNORMAL LOW (ref 8.4–10.5)
Chloride: 120 meq/L — ABNORMAL HIGH (ref 96–112)
Creatinine, Ser: 4.42 mg/dL — ABNORMAL HIGH (ref 0.50–1.35)
GFR calc Af Amer: 15 mL/min — ABNORMAL LOW
GFR calc non Af Amer: 13 mL/min — ABNORMAL LOW
Glucose, Bld: 114 mg/dL — ABNORMAL HIGH (ref 70–99)
Phosphorus: 5.1 mg/dL — ABNORMAL HIGH (ref 2.3–4.6)
Potassium: 4.5 meq/L (ref 3.5–5.1)
Sodium: 151 meq/L — ABNORMAL HIGH (ref 135–145)

## 2012-03-22 LAB — GLUCOSE, CAPILLARY
Glucose-Capillary: 185 mg/dL — ABNORMAL HIGH (ref 70–99)
Glucose-Capillary: 160 mg/dL — ABNORMAL HIGH (ref 70–99)
Glucose-Capillary: 131 mg/dL — ABNORMAL HIGH (ref 70–99)
Glucose-Capillary: 136 mg/dL — ABNORMAL HIGH (ref 70–99)
Glucose-Capillary: 121 mg/dL — ABNORMAL HIGH (ref 70–99)
Glucose-Capillary: 149 mg/dL — ABNORMAL HIGH (ref 70–99)

## 2012-03-22 NOTE — Progress Notes (Addendum)
LClinical Social Work Department BRIEF PSYCHOSOCIAL ASSESSMENT 03/23/2012  Patient:  [name], [name]     Account Number:  [id number]     Admit date:  03/09/2012  Clinical Social Worker:  Juliette Mangle  Date/Time:  03/21/2012 04:30 PM  Referred by:  Physician  Date Referred:  03/21/2012 Referred for  SNF Placement   Other Referral:   Interview type:  Family Other interview type:   CSW spoke with patient's wife. Patient was sleeping    PSYCHOSOCIAL DATA Living Status:  WIFE Admitted from facility:   Level of care:   Primary support name:  [name],[name] Primary support relationship to patient:  SPOUSE Degree of support available:   Stong and Vested. Wife is constatly st patient's bedside    CURRENT CONCERNS Current Concerns  Post-Acute Placement   Other Concerns:    SOCIAL WORK ASSESSMENT / PLAN CSW met with patient and patient's wife at bedside to discuss SNF Placement. CSW introduced self, explained role and discussed the process of placement. Patient's wife stated that she is having difficulty taking care of the patient at home. Both patient and patient's wife were agreeable to SNF placement. CSW will begin paperwork for placement and continue to assist with all d/c needs.   Assessment/plan status:  Psychosocial Support/Ongoing Assessment of Needs Other assessment/ plan:   Information/referral to community resources:   SNF choice list    PATIENT'S/FAMILY'S RESPONSE TO PLAN OF CARE: Patien's wife  was very appreciative of support and information provided by CSW. CSW will continue to follow and will assist with all d/c planning needs     Sabino Niemann, MSW, LCSWA [phone]

## 2012-03-22 NOTE — Progress Notes (Signed)
PA Student Daily Progress Note [facility] Subjective: Pt is resting comfortably. Denies CP, SOB, Dizziness, headache, and abd pain. No new complaints. Skin looking more exfoliative, taking on a reddened appearance; bleeding from areas of cracking and peeling Having very severe pruritus   Objective:  Filed Vitals:   03/21/12 0622 03/21/12 1500 03/21/12 2200 03/22/12 0438  BP: 111/56 113/56 106/62 113/51  Pulse: 84 97 84 86  Temp: 97.9 F (36.6 C) 97.4 F (36.3 C) 98 F (36.7 C) 97.3 F (36.3 C)  TempSrc: Oral Oral Oral Oral  Resp: 18 18 20 19   Height:      Weight: 107.6 kg (237 lb 3.4 oz)   107.6 kg (237 lb 3.4 oz)  SpO2: 99% 99% 98% 95%  I/O last 3 completed shifts: In: 2115 [P.O.:380; I.V.:1685; IV Piggyback:50] Out: 850 [Urine:850]    Physical Exam: General appearance: resting, no distress and moderately obese  Lungs: diminished breath sounds bilaterally  Heart: regular rate and rhythm, S1, S2 normal, no murmur, click, rub or gallop  Abdomen: soft, non-tender; bowel sounds normal; no masses, no organomegaly  Extremities: edema 2+ to calves bilaterally, diabetic foot wound on right foot, callused and dry, no pus  Skin: dry, exfoliation over entire body/arms/legs/face; skin breakdown on buttocks Red/diffuse exfoliative process Neurologic: Grossly normal  GU: Clear, dark, urine from foley catheter Labs: Basic Metabolic Panel:  Lab 03/22/12 4098 03/21/12 0520 03/20/12 0910 03/20/12 0907  NA 151* 155* 155* --  K 4.5 4.1 4.1 --  CL 120* 123* 123* --  CO2 18* 18* 18* --  GLUCOSE 114* 113* 115* --  BUN 83* 82* 86* --  CREATININE 4.42* 4.34* 4.59* --  CALCIUM 7.7* 8.0* 8.2* --  ALB -- -- -- --  PHOS 5.1* 4.1 -- 4.3   Liver Function Tests:  Lab 03/22/12 0829 03/21/12 0520 03/20/12 0907 03/19/12 0803 03/18/12 0505 03/17/12 0700  AST -- -- -- 139* 150* 226*  ALT -- -- -- 44 78* 113*  ALKPHOS -- -- -- 334* 295* 294*  BILITOT -- -- -- 0.9 0.8 0.8  PROT  -- -- -- 7.1 6.5 6.3  ALBUMIN 1.9* 2.3* 2.3* -- -- --    CBC:  Lab 03/19/12 0803 03/18/12 0505 03/17/12 0700 03/16/12 0500  WBC 11.5* 11.9* 13.3* --  NEUTROABS -- -- -- --  HGB 11.8* 11.5* 10.7* --  HCT 35.5* 34.6* 33.1* --  MCV 79.8 79.4 79.8 80.2  PLT 155 147* 135* --   Results for KHIREE, BUKHARI (MRN [id number]) as of 03/22/2012 12:33  Ref. Range 02/10/2012 15:35 02/19/2012 09:21 02/19/2012 14:38 03/09/2012 12:14 03/09/2012 17:54  Eosinophils Relative Latest Range: 0-5 % 16 (H) 18 (H) 17 (H) 37 (H) 35 (H)   CBG:  Lab 03/22/12 0758 03/22/12 0446 03/22/12 0105 03/21/12 2040 03/21/12 1706  GLUCAP 115* 131* 160* 185* 185*   Micro Results: Recent Results (from the past 240 hour(s))  CULTURE, BLOOD (ROUTINE X 2)     Status: Normal   Collection Time   03/13/12 10:45 PM      Component Value Range Status Comment   Specimen Description BLOOD LEFT ARM   Final    Special Requests     Final    Value: BOTTLES DRAWN AEROBIC AND ANAEROBIC 7CC BLUE,5CC RED   Culture  Setup Time 03/14/2012 12:19   Final    Culture     Final    Value: STAPHYLOCOCCUS AUREUS     Note: SUSCEPTIBILITIES PERFORMED ON PREVIOUS CULTURE WITHIN  THE LAST 5 DAYS.     Note: CRITICAL RESULT CALLED TO, READ BACK BY AND VERIFIED WITH: MANDISIA AT 2140 03/14/12 BY SNOLO   Report Status 03/16/2012 FINAL   Final   CULTURE, BLOOD (ROUTINE X 2)     Status: Normal   Collection Time   03/13/12 10:45 PM      Component Value Range Status Comment   Specimen Description BLOOD LEFT ARM   Final    Special Requests BOTTLES DRAWN AEROBIC ONLY 5CC   Final    Culture  Setup Time 03/14/2012 12:19   Final    Culture     Final    Value: STAPHYLOCOCCUS AUREUS     Note: RIFAMPIN AND GENTAMICIN SHOULD NOT BE USED AS SINGLE DRUGS FOR TREATMENT OF STAPH INFECTIONS.     Note: CRITICAL RESULT CALLED TO, READ BACK BY AND VERIFIED WITH: MANDISIA AT 2141 03/14/12 BY SNOLO   Report Status 03/16/2012 FINAL   Final    Organism ID, Bacteria STAPHYLOCOCCUS  AUREUS   Final   URINE CULTURE     Status: Normal   Collection Time   03/16/12  6:51 PM      Component Value Range Status Comment   Specimen Description URINE, RANDOM   Final    Special Requests NONE   Final    Culture  Setup Time 03/16/2012 19:54   Final    Colony Count NO GROWTH   Final    Culture NO GROWTH   Final    Report Status 03/17/2012 FINAL   Final   CULTURE, BLOOD (ROUTINE X 2)     Status: Normal (Preliminary result)   Collection Time   03/17/12  7:00 AM      Component Value Range Status Comment   Specimen Description BLOOD LEFT HAND   Final    Special Requests BOTTLES DRAWN AEROBIC AND ANAEROBIC 10CC EACH   Final    Culture  Setup Time 03/17/2012 12:45   Final    Culture     Final    Value:        BLOOD CULTURE RECEIVED NO GROWTH TO DATE CULTURE WILL BE HELD FOR 5 DAYS BEFORE ISSUING A FINAL NEGATIVE REPORT   Report Status PENDING   Incomplete   CULTURE, BLOOD (ROUTINE X 2)     Status: Normal (Preliminary result)   Collection Time   03/17/12  7:00 AM      Component Value Range Status Comment   Specimen Description BLOOD LEFT ARM   Final    Special Requests BOTTLES DRAWN AEROBIC AND ANAEROBIC 10CC EACH   Final    Culture  Setup Time 03/17/2012 12:45   Final    Culture     Final    Value:        BLOOD CULTURE RECEIVED NO GROWTH TO DATE CULTURE WILL BE HELD FOR 5 DAYS BEFORE ISSUING A FINAL NEGATIVE REPORT   Report Status PENDING   Incomplete   CATH TIP CULTURE     Status: Normal   Collection Time   03/17/12  4:27 PM      Component Value Range Status Comment   Specimen Description CATH TIP PICC LINE   Final    Special Requests NONE   Final    Culture     Final    Value: >100 COLONIES STAPHYLOCOCCUS AUREUS     Note: RIFAMPIN AND GENTAMICIN SHOULD NOT BE USED AS SINGLE DRUGS FOR TREATMENT OF STAPH INFECTIONS.   Report Status 03/21/2012  FINAL   Final    Organism ID, Bacteria STAPHYLOCOCCUS AUREUS   Final    Medications: Scheduled Meds:   . aspirin EC  81 mg Oral  Daily  .  ceFAZolin (ANCEF) IV  1 g Intravenous Q12H  . heparin  5,000 Units Subcutaneous Q8H  . insulin aspart  0-15 Units Subcutaneous Q4H  . insulin glargine  10 Units Subcutaneous QHS  . pantoprazole  40 mg Oral Q0600  . sodium chloride  3 mL Intravenous Q12H   Continuous Infusions:   . dextrose 75 mL/hr at 03/22/12 0820   PRN Meds:.acetaminophen, diphenhydrAMINE, ondansetron (ZOFRAN) IV, ondansetron, DISCONTD: alum & mag hydroxide-simeth  Assessment/Plan: Mr. Ortel is a 66 yo AA male with a PMH of CKD stage 2, CHF, HTN, DM2, and hypercholesteremia.   1. Acute on CRF- vol overloaded but didn't tolerate diuretics (inc'd Creat). Initially thought to be due to low BP and poor perfusion. Lasix on hold and creat down a little more today. Prob significant 3rd spacing due to allergic drug reaction and prior sepsis. Continue off of diuretics. Cr. Is 4.42 today which is stable from yesterday 4.34. 2. CHF, diast- normal EF 60%. Low FeNa suggesting prerenal/low intravasc volume at time of initial presentation. See below - interstitial nephritis also in the differential. 3. HTN- pt has been hypotensive on admission, BP meds stopped Normotensive at present  4. DM2- Pts most recent HbA1c is 10.5- restart basal insulin and consider short acting before meals  5. Hypoalbuminemia- continue per primary team.  6. MSSA bacteremia- TEE negative. On Ancef.  7. N/V/D- initial presentation, resolved.  8. Hypernatremia- D5W increased on 10/14 to 75/hr without improvement in NA.  Is stable today at 155- will continue to monitor. 9. Diffuse exfoliative skin rash - on no specific therapy - etiology not clear ??? Drug reaction. Derm consult pending  This is a Psychologist, occupational Note.  The care of the patient was discussed with Dr. Eliott Nine and the assessment and plan formulated with their assistance.  Please see their attached note for official documentation of the daily encounter.  Aniceto Boss, MA, PA-S2 03/22/2012,  10:11 AM With the exfoliative erythroderma that appears to be worsening ,and a 35% peripheral eosinophilia earlier in the admission (repeat diff ordered)  he appears to be having allergic reaction to something - would suspect drug - and could have a component of acute interstitial nephritis playing a role in his renal pathophysiology.  I am told that dermatology does not come to the hospital here for consultation. Options would in my mind be empiric steroids (with the blessing of infections disease who might recommend alternate therapy for his MSSA bacteremia - could he be having an allergic reaction to cephalosporins?????)  For skin (and possible renal involvement) vs transfer to center where derm consultation available.  I attempted to review notes from last hosp in September - appears he did get a course of steroids then for the rash which was present even at that time, although I can't tell how much and for how long. Will contact Dr. Sharyn Lull with recommendations.

## 2012-03-22 NOTE — Progress Notes (Signed)
PT Cancellation Note  Patient Details Name: [name] MRN: [id number] DOB: [birth year]   Cancelled Treatment:     Wife requesting to hold therapy today.      Verdell Face, [place] [phone] 03/22/2012

## 2012-03-22 NOTE — Progress Notes (Signed)
Subjective:  Patient denies any chest pain or shortness of breath States feels better. Awaiting dermatology consult. Renal function slowly improving Objective:  Vital Signs in the last 24 hours: Temp:  [97.3 F (36.3 C)-98 F (36.7 C)] 97.3 F (36.3 C) (10/15 0438) Pulse Rate:  [84-97] 86  (10/15 0438) Resp:  [18-20] 19  (10/15 0438) BP: (106-113)/(51-62) 113/51 mmHg (10/15 0438) SpO2:  [95 %-99 %] 95 % (10/15 0438) Weight:  [107.6 kg (237 lb 3.4 oz)] 107.6 kg (237 lb 3.4 oz) (10/15 0438)  Intake/Output from previous day: 10/14 0701 - 10/15 0700 In: 2015 [P.O.:280; I.V.:1685; IV Piggyback:50] Out: 525 [Urine:525] Intake/Output from this shift:    Physical Exam: Neck: no adenopathy, no carotid bruit, no JVD and supple, symmetrical, trachea midline Lungs: clear to auscultation bilaterally Heart: regular rate and rhythm, S1, S2 normal and Soft systolic murmur noted Abdomen: soft, non-tender; bowel sounds normal; no masses,  no organomegaly Extremities: No clubbing cyanosis 1+ edema noted  Lab Results: No results found for this basename: WBC:2,HGB:2,PLT:2 in the last 72 hours  Basename 03/21/12 0520 03/20/12 0910  NA 155* 155*  K 4.1 4.1  CL 123* 123*  CO2 18* 18*  GLUCOSE 113* 115*  BUN 82* 86*  CREATININE 4.34* 4.59*   No results found for this basename: TROPONINI:2,CK,MB:2 in the last 72 hours Hepatic Function Panel  Basename 03/21/12 0520  PROT --  ALBUMIN 2.3*  AST --  ALT --  ALKPHOS --  BILITOT --  BILIDIR --  IBILI --   No results found for this basename: CHOL in the last 72 hours No results found for this basename: PROTIME in the last 72 hours  Imaging: Imaging results have been reviewed and No results found.  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

## 2012-03-22 NOTE — Progress Notes (Signed)
Chart review complete.  Patient is not eligible for [facility] Care Management services because her PCP is not [facility] affiliated.  For any additional questions or new referrals please contact Tim Henderson BSN RN [facility] Liaison at [phone]  °

## 2012-03-23 ENCOUNTER — Telehealth: Payer: Self-pay | Admitting: Cardiology

## 2012-03-23 LAB — GLUCOSE, CAPILLARY: Glucose-Capillary: 188 mg/dL — ABNORMAL HIGH (ref 70–99)

## 2012-03-23 LAB — CBC WITH DIFFERENTIAL/PLATELET
Basophils Absolute: 0.2 10*3/uL — ABNORMAL HIGH (ref 0.0–0.1)
Eosinophils Absolute: 2 10*3/uL — ABNORMAL HIGH (ref 0.0–0.7)
MCH: 26 pg (ref 26.0–34.0)
MCHC: 32.5 g/dL (ref 30.0–36.0)
Monocytes Absolute: 0.7 10*3/uL (ref 0.1–1.0)
Neutrophils Relative %: 53 % (ref 43–77)
Platelets: 153 10*3/uL (ref 150–400)

## 2012-03-23 LAB — RENAL FUNCTION PANEL
Albumin: 1.9 g/dL — ABNORMAL LOW (ref 3.5–5.2)
Calcium: 7.8 mg/dL — ABNORMAL LOW (ref 8.4–10.5)
Chloride: 123 mEq/L — ABNORMAL HIGH (ref 96–112)
Creatinine, Ser: 4.85 mg/dL — ABNORMAL HIGH (ref 0.50–1.35)
GFR calc Af Amer: 13 mL/min — ABNORMAL LOW (ref >90)
GFR calc non Af Amer: 11 mL/min — ABNORMAL LOW (ref >90)
Sodium: 153 mEq/L — ABNORMAL HIGH (ref 135–145)

## 2012-03-23 LAB — CULTURE, BLOOD (ROUTINE X 2): Culture: NO GROWTH

## 2012-03-23 MED ORDER — BIOTENE DRY MOUTH MT LIQD
15.0000 mL | Freq: Two times a day (BID) | OROMUCOSAL | Status: DC
  Administered 2012-03-23 – 2012-04-01 (×18): 15 mL via OROMUCOSAL

## 2012-03-23 MED ORDER — PREDNISONE 50 MG PO TABS
60.0000 mg | ORAL_TABLET | Freq: Every day | ORAL | Status: DC
  Administered 2012-03-23: 60 mg via ORAL
  Filled 2012-03-23 (×4): qty 1

## 2012-03-23 MED ORDER — VANCOMYCIN HCL 1000 MG IV SOLR
2000.0000 mg | Freq: Once | INTRAVENOUS | Status: AC
  Administered 2012-03-23: 2000 mg via INTRAVENOUS
  Filled 2012-03-23: qty 2000

## 2012-03-23 MED ORDER — VANCOMYCIN HCL 1000 MG IV SOLR
1500.0000 mg | INTRAVENOUS | Status: AC
  Administered 2012-03-25 – 2012-03-31 (×4): 1500 mg via INTRAVENOUS
  Filled 2012-03-23 (×4): qty 1500

## 2012-03-23 MED ORDER — TRIAMCINOLONE ACETONIDE 0.5 % EX CREA
TOPICAL_CREAM | Freq: Two times a day (BID) | CUTANEOUS | Status: DC
  Administered 2012-03-23 – 2012-03-25 (×4): via TOPICAL
  Filled 2012-03-23 (×3): qty 15

## 2012-03-23 MED ORDER — CHLORHEXIDINE GLUCONATE 0.12 % MT SOLN
15.0000 mL | Freq: Two times a day (BID) | OROMUCOSAL | Status: DC
  Administered 2012-03-23 – 2012-04-01 (×17): 15 mL via OROMUCOSAL
  Filled 2012-03-23 (×23): qty 15

## 2012-03-23 MED ORDER — CEPHALEXIN 500 MG PO CAPS
500.0000 mg | ORAL_CAPSULE | Freq: Three times a day (TID) | ORAL | Status: DC
  Administered 2012-03-23: 500 mg via ORAL
  Filled 2012-03-23 (×5): qty 1

## 2012-03-23 MED ORDER — INSULIN ASPART 100 UNIT/ML ~~LOC~~ SOLN
0.0000 [IU] | Freq: Three times a day (TID) | SUBCUTANEOUS | Status: DC
  Administered 2012-03-24: 3 [IU] via SUBCUTANEOUS
  Administered 2012-03-24 (×2): 5 [IU] via SUBCUTANEOUS
  Administered 2012-03-25: 11 [IU] via SUBCUTANEOUS
  Administered 2012-03-25: 5 [IU] via SUBCUTANEOUS
  Administered 2012-03-25: 8 [IU] via SUBCUTANEOUS
  Administered 2012-03-26: 15 [IU] via SUBCUTANEOUS
  Administered 2012-03-26: 11 [IU] via SUBCUTANEOUS

## 2012-03-23 NOTE — Progress Notes (Signed)
I discussed the case with Dr. Terri Piedra of dermatology.  He feels less likely to be consistent with DRESS syndrome and more related to medications, such as antibiotics, though not a typical reaction.  He also suggested triamcinolone bid, oral steroids (as you are doing) up to 1mg /kg/day and hydroxizine for pruritis.  He also will see the patient as an outpatient right after discharge.  Appt can be made at [phone] and they will get him in soon.

## 2012-03-23 NOTE — Progress Notes (Signed)
While transferring patient from [facility] to bed, pts IV pulled out. Pt. Refused to have a new IV placed. Ancef due at 0100. Dr. Sharyn Lull notified.Orders for PO abx. Will continue to monitor. Chason Mciver, Melida Quitter

## 2012-03-23 NOTE — Clinical Social Work Placement (Signed)
Late Entry: Clinical Social Work Department CLINICAL SOCIAL WORK PLACEMENT NOTE 03/23/2012  Patient:  [name], [name]  Account Number:  [id number] Admit date:  03/09/2012  Clinical Social Worker:  Keely Drennan Lubertha Basque  Date/time:  03/21/2012 04:30 PM  Clinical Social Work is seeking post-discharge placement for this patient at the following level of care:   SKILLED NURSING   (*CSW will update this form in Epic as items are completed)   03/21/2012  Patient/family provided with [name] Department of Clinical Social Work's list of facilities offering this level of care within the geographic area requested by the patient (or if unable, by the patient's family).  03/21/2012  Patient/family informed of their freedom to choose among providers that offer the needed level of care, that participate in Medicare, Medicaid or managed care program needed by the patient, have an available bed and are willing to accept the patient.  03/21/2012  Patient/family informed of [facility]' ownership interest in [facility], as well as of the fact that they are under no obligation to receive care at this facility.  PASARR submitted to EDS on 03/22/2012 PASARR number received from [employer] on 03/22/2012  FL2 transmitted to all facilities in geographic area requested by pt/family on  03/21/2012 FL2 transmitted to all facilities within larger geographic area on 03/21/2012  Patient informed that his/her managed care company has contracts with or will negotiate with  certain facilities, including the following:     Patient/family informed of bed offers received:  03/22/2012 Patient chooses bed at  Physician recommends and patient chooses bed at    Patient to be transferred to  on   Patient to be transferred to facility by   The following physician request were entered in Epic:   Additional Comments: CSW met with patient's wife for choice. Patient's wife is going to visit a few facilites before  deciding.  Sabino Niemann, MSW, [employer] [phone]

## 2012-03-23 NOTE — Progress Notes (Signed)
[facility] Rounding Note Subjective:  Thirsty Pulled IV out Says he will allow it to be replaced Still with very intense pruritis Appears derm has not seen (I am told there are no dermatologist to see patient in the hospital setting...)  Objective: BP 133/75  Pulse 96  Temp 98.7 F (37.1 C) (Axillary)  Resp 20  Ht 5\' 11"  (1.803 m)  Wt 107.1 kg (236 lb 1.8 oz)  BMI 32.93 kg/m2  SpO2 99% I/O last 3 completed shifts: In: 2332.5 [P.O.:220; I.V.:2012.5; IV Piggyback:100] Out: 1251 [Urine:1250; Stool:1] EXAM: Diffuse exfoliative erythroderma trunk, extremities, face Lungs grossly clear No pericardial rub 2+edema both lower extremities No asterixus   Basename 03/23/12 0710 03/22/12 0829  NA 153* 151*  K 4.8 4.5  CL 123* 120*  CO2 17* 18*  GLUCOSE 84 114*  BUN 91* 83*  CREATININE 4.85* 4.42*  CALCIUM 7.8* 7.7*  MG -- --  PHOS 5.9* 5.1*   Liver Function Tests:  Basename 03/23/12 0710 03/22/12 0829  AST -- --  ALT -- --  ALKPHOS -- --  BILITOT -- --  PROT -- --  ALBUMIN 1.9* 1.9*   CBC:  Basename 03/23/12 0710  WBC 9.8  NEUTROABS 5.1  HGB 11.6*  HCT 35.7*  MCV 80.0  PLT 153   Results for [name] (MRN [id number]) as of 03/23/2012 09:18  Ref. Range 02/19/2012 09:21 02/19/2012 14:38 03/09/2012 12:14 03/09/2012 17:54 03/23/2012 07:10  Eosinophils Relative Latest Range: 0-5 % 18 (H) 17 (H) 37 (H) 35 (H) 20 (H)      Medications: . aspirin EC  81 mg Oral Daily  .  ceFAZolin (ANCEF) IV  1 g Intravenous Q12H  . cephALEXin  500 mg Oral Q8H  . heparin  5,000 Units Subcutaneous Q8H  . insulin aspart  0-15 Units Subcutaneous Q4H  . insulin glargine  10 Units Subcutaneous QHS  . pantoprazole  40 mg Oral Q0600  . sodium chloride  3 mL Intravenous Q12H   Assessment/Recommendations  66 yo AA male with a PMH of CKD stage 2, CHF, HTN, DM2, and hypercholesteremia. Baseline creatinine of 1.77 on 02/24/12, 2.2 on admission for uncontrolled DM and  MSSA bacteremia, with continued worsening, in setting of an exfoliative erythroderma and significant peripheral eosinophilia  1. Acute on CRF  Low FeNa suggesting prerenal/low intravasc volume at time of initial presentation. Peripheral edema/evidence volume overload Renal function worsened with diuretics, continues to worsen with D/C of diuretics and administration of IVF Initially thought to be due to low BP and poor perfusion.  Continues to worsen With skin rash and eosinophilia concerned about allergic interstitial nephritis as possible contributor to kidney injury Needs steroids (in my opinion) for skin - and if AIN should help kidneys as well Do not favor renal biopsy at this point Will send urine for eosinophils  I am starting prednisone 60 mg/day (acknolwdging the effects on BS and possible issues with his bacteremia) PT REALLY NEEDS TO BE SEEN BY DERM Needs IV restarted (or feeding tube put in) for water administration for his hypernatremia  2. CHF, diast- normal EF 60%.  3. HTN- pt has been hypotensive on admission, BP meds stopped Normotensive at present  4. DM2- per primary 5. MSSA bacteremia- TEE negative. On Ancef.  6. N/V/D- initial presentation, resolved.  7. Hypernatremia- D5W increased on 10/14 to 75/hr without improvement in NA. Is stable today at 155- will continue to monitor.  8. Diffuse exfoliative skin rash - on no specific therapy -  etiology not clear ??? Drug reaction. Derm consult ???  Starting steroids for possible AIN - will likely help skin - but derm REALLY NEEDS TO SEE 9. Hypernatremia with free water deficit - see #1  [name]

## 2012-03-23 NOTE — Progress Notes (Signed)
Subjective: Appreciate renal service help. Started on steroids empirically Patient denies any chest pain or shortness of breath. No IV access at present. Called multiple dermatologic offices but no dermatologiist available for inpatient consult   Objective:  Vital Signs in the last 24 hours: Temp:  [97.8 F (36.6 C)-98.7 F (37.1 C)] 97.9 F (36.6 C) (10/16 0944) Pulse Rate:  [74-96] 82  (10/16 0944) Resp:  [18-20] 18  (10/16 0944) BP: (117-133)/(55-75) 117/66 mmHg (10/16 0944) SpO2:  [97 %-100 %] 97 % (10/16 0944) Weight:  [107.1 kg (236 lb 1.8 oz)] 107.1 kg (236 lb 1.8 oz) (10/16 1610)  Intake/Output from previous day: 10/15 0701 - 10/16 0700 In: 1162.5 [I.V.:1112.5; IV Piggyback:50] Out: 1026 [Urine:1025; Stool:1] Intake/Output from this shift: Total I/O In: 120 [P.O.:120] Out: 300 [Urine:300]  Physical Exam: Neck: no adenopathy, no carotid bruit, no JVD and supple, symmetrical, trachea midline Lungs: Clear to auscultation anterolaterally Heart: regular rate and rhythm, S1, S2 normal and Soft systolic murmur noted Abdomen: soft, non-tender; bowel sounds normal; no masses,  no organomegaly Extremities: No clubbing cyanosis 1+ edema Skin: Diffuse exfoliative rash with peeling  Lab Results:  Basename 03/23/12 0710  WBC 9.8  HGB 11.6*  PLT 153    Basename 03/23/12 0710 03/22/12 0829  NA 153* 151*  K 4.8 4.5  CL 123* 120*  CO2 17* 18*  GLUCOSE 84 114*  BUN 91* 83*  CREATININE 4.85* 4.42*   No results found for this basename: TROPONINI:2,CK,MB:2 in the last 72 hours Hepatic Function Panel  Basename 03/23/12 0710  PROT --  ALBUMIN 1.9*  AST --  ALT --  ALKPHOS --  BILITOT --  BILIDIR --  IBILI --   No results found for this basename: CHOL in the last 72 hours No results found for this basename: PROTIME in the last 72 hours  Imaging: Imaging results have been reviewed and No results found.  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

## 2012-03-23 NOTE — Progress Notes (Signed)
Physical Therapy Treatment Patient Details Name: [name] MRN: [id number] DOB: 02/06/1946 Today's Date: 03/23/2012 Time: 4098-1191 PT Time Calculation (min): 25 min  PT Assessment / Plan / Recommendation Comments on Treatment Session  Although pt agreeable to participate in therapy session, he still seems very unmotivated & requires strong encouragement entire session.  Pt very weak & fatigued.       Follow Up Recommendations  Post acute inpatient     Does the patient have the potential to tolerate intense rehabilitation  No, Recommend SNF  Barriers to Discharge        Equipment Recommendations  3 in 1 bedside comode;Hospital bed    Recommendations for Other Services    Frequency Min 3X/week   Plan      Precautions / Restrictions Precautions Precautions: Fall Restrictions Weight Bearing Restrictions: No       Mobility  Bed Mobility Bed Mobility: Supine to Sit;Sitting - Scoot to Edge of Bed Supine to Sit: 3: Mod assist;HOB elevated;With rails Sitting - Scoot to Edge of Bed: 3: Mod assist Details for Bed Mobility Assistance: Cont's to require strong encouragement to perform as independently as possible.  Cues for technique & use of UE's to increase ease of transition.  Pt automatically reaching for therapist & requesting for (A) without attempting 1st.   Transfers Transfers: Sit to Stand;Stand to Sit Sit to Stand: 3: Mod assist;With upper extremity assist;From bed Stand to Sit: 4: Min assist;With upper extremity assist;With armrests;To chair/3-in-1 Details for Transfer Assistance: cues for hand placement & technique.  (A) to achieve standing, balance, & controlled descent.   Ambulation/Gait Ambulation/Gait Assistance: 4: Min assist (+2 to follow with recliner) Ambulation Distance (Feet): 150 Feet Assistive device: Rolling walker Ambulation/Gait Assistance Details: Cues for increased safety awareness of environment, tall posture, hand placement on RW, to stay inside  RW, & safety with turns.  Pt with slow shuffling steps & flexed posture,  Gait Pattern: Step-through pattern;Decreased stride length;Shuffle;Trunk flexed Gait velocity: decreased Stairs: No Wheelchair Mobility Wheelchair Mobility: No      PT Goals Acute Rehab PT Goals Time For Goal Achievement: 03/19/12 Potential to Achieve Goals: Good Pt will go Supine/Side to Sit: with modified independence PT Goal: Supine/Side to Sit - Progress: Progressing toward goal Pt will go Sit to Supine/Side: with modified independence Pt will go Sit to Stand: with modified independence PT Goal: Sit to Stand - Progress: Not met Pt will go Stand to Sit: with modified independence PT Goal: Stand to Sit - Progress: Not met Pt will Ambulate: >150 feet;with modified independence;with least restrictive assistive device PT Goal: Ambulate - Progress: Progressing toward goal  Visit Information  Last PT Received On: 03/23/12 Assistance Needed: +2 (safety)    Subjective Data  Subjective: "You don't know me" Patient Stated Goal: Get better to go home   Cognition  Overall Cognitive Status: Impaired Area of Impairment: Safety/judgement;Awareness of errors;Problem solving Arousal/Alertness: Awake/alert Orientation Level: Appears intact for tasks assessed Behavior During Session: Flat affect Safety/Judgement: Decreased awareness of safety precautions;Decreased safety judgement for tasks assessed;Decreased awareness of need for assistance Awareness of Errors: Assistance required to identify errors made;Assistance required to correct errors made Problem Solving: cues for functional basic Cognition - Other Comments: Pt required strong motivation entire session.  Self-limiting.      Balance     End of Session PT - End of Session Equipment Utilized During Treatment: Gait belt Activity Tolerance: Patient limited by fatigue Patient left: in chair;with call bell/phone within reach;with family/visitor present  Nurse  Communication: Mobility status     Verdell Face, [place] [phone] 03/23/2012

## 2012-03-23 NOTE — Progress Notes (Signed)
ANTIBIOTIC CONSULT NOTE - INITIAL  Pharmacy Consult for vancomycin Indication: MSSA bacteremia   No Known Allergies  Patient Measurements: Height: 5\' 11"  (180.3 cm) Weight: 236 lb 1.8 oz (107.1 kg) (bed scale) IBW/kg (Calculated) : 75.3   Vital Signs: Temp: 97.9 F (36.6 C) (10/16 0944) Temp src: Oral (10/16 0944) BP: 117/66 mmHg (10/16 0944) Pulse Rate: 82  (10/16 0944) Intake/Output from previous day: 10/15 0701 - 10/16 0700 In: 1162.5 [I.V.:1112.5; IV Piggyback:50] Out: 1026 [Urine:1025; Stool:1] Intake/Output from this shift: Total I/O In: 120 [P.O.:120] Out: 300 [Urine:300]  Labs:  [facility] 03/23/12 0710 03/22/12 0829 03/21/12 0520  WBC 9.8 -- --  HGB 11.6* -- --  PLT 153 -- --  LABCREA -- -- --  CREATININE 4.85* 4.42* 4.34*   Estimated Creatinine Clearance: 18.4 ml/min (by C-G formula based on Cr of 4.85). No results found for this basename: VANCOTROUGH:2,VANCOPEAK:2,VANCORANDOM:2,GENTTROUGH:2,GENTPEAK:2,GENTRANDOM:2,TOBRATROUGH:2,TOBRAPEAK:2,TOBRARND:2,AMIKACINPEAK:2,AMIKACINTROU:2,AMIKACIN:2, in the last 72 hours   Microbiology: Recent Results (from the past 720 hour(s))  CLOSTRIDIUM DIFFICILE BY PCR     Status: Normal   Collection Time   03/09/12  8:30 PM      Component Value Range Status Comment   C difficile by pcr NEGATIVE  NEGATIVE Final   CULTURE, BLOOD (ROUTINE X 2)     Status: Normal   Collection Time   03/13/12 10:45 PM      Component Value Range Status Comment   Specimen Description BLOOD LEFT ARM   Final    Special Requests     Final    Value: BOTTLES DRAWN AEROBIC AND ANAEROBIC 7CC BLUE,5CC RED   Culture  Setup Time 03/14/2012 12:19   Final    Culture     Final    Value: STAPHYLOCOCCUS AUREUS     Note: SUSCEPTIBILITIES PERFORMED ON PREVIOUS CULTURE WITHIN THE LAST 5 DAYS.     Note: CRITICAL RESULT CALLED TO, READ BACK BY AND VERIFIED WITH: MANDISIA AT 2140 03/14/12 BY SNOLO   Report Status 03/16/2012 FINAL   Final   CULTURE, BLOOD (ROUTINE X  2)     Status: Normal   Collection Time   03/13/12 10:45 PM      Component Value Range Status Comment   Specimen Description BLOOD LEFT ARM   Final    Special Requests BOTTLES DRAWN AEROBIC ONLY 5CC   Final    Culture  Setup Time 03/14/2012 12:19   Final    Culture     Final    Value: STAPHYLOCOCCUS AUREUS     Note: RIFAMPIN AND GENTAMICIN SHOULD NOT BE USED AS SINGLE DRUGS FOR TREATMENT OF STAPH INFECTIONS.     Note: CRITICAL RESULT CALLED TO, READ BACK BY AND VERIFIED WITH: MANDISIA AT 2141 03/14/12 BY SNOLO   Report Status 03/16/2012 FINAL   Final    Organism ID, Bacteria STAPHYLOCOCCUS AUREUS   Final   URINE CULTURE     Status: Normal   Collection Time   03/16/12  6:51 PM      Component Value Range Status Comment   Specimen Description URINE, RANDOM   Final    Special Requests NONE   Final    Culture  Setup Time 03/16/2012 19:54   Final    Colony Count NO GROWTH   Final    Culture NO GROWTH   Final    Report Status 03/17/2012 FINAL   Final   CULTURE, BLOOD (ROUTINE X 2)     Status: Normal   Collection Time   03/17/12  7:00 AM  Component Value Range Status Comment   Specimen Description BLOOD LEFT HAND   Final    Special Requests BOTTLES DRAWN AEROBIC AND ANAEROBIC 10CC EACH   Final    Culture  Setup Time 03/17/2012 12:45   Final    Culture NO GROWTH 5 DAYS   Final    Report Status 03/23/2012 FINAL   Final   CULTURE, BLOOD (ROUTINE X 2)     Status: Normal   Collection Time   03/17/12  7:00 AM      Component Value Range Status Comment   Specimen Description BLOOD LEFT ARM   Final    Special Requests BOTTLES DRAWN AEROBIC AND ANAEROBIC 10CC EACH   Final    Culture  Setup Time 03/17/2012 12:45   Final    Culture NO GROWTH 5 DAYS   Final    Report Status 03/23/2012 FINAL   Final   CATH TIP CULTURE     Status: Normal   Collection Time   03/17/12  4:27 PM      Component Value Range Status Comment   Specimen Description CATH TIP PICC LINE   Final    Special Requests NONE    Final    Culture     Final    Value: >100 COLONIES STAPHYLOCOCCUS AUREUS     Note: RIFAMPIN AND GENTAMICIN SHOULD NOT BE USED AS SINGLE DRUGS FOR TREATMENT OF STAPH INFECTIONS.   Report Status 03/21/2012 FINAL   Final    Organism ID, Bacteria STAPHYLOCOCCUS AUREUS   Final     Medical History: Past Medical History  Diagnosis Date  . Diabetes mellitus   . Hypertension   . Hyperlipidemia   . CHF (congestive heart failure)   . Enlarged heart   . Pneumonia     hx of     Medications:  Prescriptions prior to admission  Medication Sig Dispense Refill  . aspirin EC 81 MG EC tablet Take 1 tablet (81 mg total) by mouth daily.  30 tablet  3  . carvedilol (COREG) 25 MG tablet Take 1 tablet (25 mg total) by mouth 2 (two) times daily with a meal.  60 tablet  3  . diphenhydrAMINE (BENADRYL) 25 MG tablet Take 25 mg by mouth every 8 (eight) hours as needed. For itching      . diphenhydrAMINE-zinc acetate (BENADRYL) cream Apply 1 application topically 3 (three) times daily as needed. As needed for rash.      . furosemide (LASIX) 80 MG tablet Take 80 mg by mouth 2 (two) times daily.      Marland Kitchen glipiZIDE (GLUCOTROL) 5 MG tablet Take 5 mg by mouth 2 (two) times daily before a meal.      . isosorbide-hydrALAZINE (BIDIL) 20-37.5 MG per tablet Take 1 tablet by mouth 3 (three) times daily.      . potassium chloride SA (K-DUR,KLOR-CON) 20 MEQ tablet Take 20 mEq by mouth 2 (two) times daily.      Marland Kitchen spironolactone (ALDACTONE) 25 MG tablet Take 25 mg by mouth 2 (two) times daily.       Assessment: 66 yo man to change antibiotics to vancomycin for MSSA bacteremia due to rash potentially caused by ancef. 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    Goal of Therapy:  Vancomycin trough level 15-20 mcg/ml  Plan:  Vancomycin 2 gm IV X1 then 1500 mg IV q48 hours.  Will f/u renal function and clinical course. Check  vanc level when appropriate.  Thanks  for allowing pharmacy to be a part of this patient's care.  Talbert Cage, PharmD Clinical Pharmacist, [phone]

## 2012-03-23 NOTE — Progress Notes (Signed)
[facility]  Date of Admission:  03/09/2012  Antibiotics: Ancef 10/9 -  Ceftriaxone 10/6-8  Subjective: Diffuse rash, exfoliative dermatitis  Objective: Temp:  [97.8 F (36.6 C)-98.7 F (37.1 C)] 97.9 F (36.6 C) (10/16 0944) Pulse Rate:  [74-96] 82  (10/16 0944) Resp:  [18-20] 18  (10/16 0944) BP: (117-133)/(55-75) 117/66 mmHg (10/16 0944) SpO2:  [97 %-100 %] 97 % (10/16 0944) Weight:  [236 lb 1.8 oz (107.1 kg)] 236 lb 1.8 oz (107.1 kg) (10/16 1610)  General: Awake, nad Skin: diffuse exfoliative rash Lungs: CTA B Cor: RRR   Lab Results Lab Results  Component Value Date   WBC 9.8 03/23/2012   HGB 11.6* 03/23/2012   HCT 35.7* 03/23/2012   MCV 80.0 03/23/2012   PLT 153 03/23/2012    Lab Results  Component Value Date   CREATININE 4.85* 03/23/2012   BUN 91* 03/23/2012   NA 153* 03/23/2012   K 4.8 03/23/2012   CL 123* 03/23/2012   CO2 17* 03/23/2012    Lab Results  Component Value Date   ALT 44 03/19/2012   AST 139* 03/19/2012   ALKPHOS 334* 03/19/2012   BILITOT 0.9 03/19/2012      Microbiology: Recent Results (from the past 240 hour(s))  CULTURE, BLOOD (ROUTINE X 2)     Status: Normal   Collection Time   03/13/12 10:45 PM      Component Value Range Status Comment   Specimen Description BLOOD LEFT ARM   Final    Special Requests     Final    Value: BOTTLES DRAWN AEROBIC AND ANAEROBIC 7CC BLUE,5CC RED   Culture  Setup Time 03/14/2012 12:19   Final    Culture     Final    Value: STAPHYLOCOCCUS AUREUS     Note: SUSCEPTIBILITIES PERFORMED ON PREVIOUS CULTURE WITHIN THE LAST 5 DAYS.     Note: CRITICAL RESULT CALLED TO, READ BACK BY AND VERIFIED WITH: MANDISIA AT 2140 03/14/12 BY SNOLO   Report Status 03/16/2012 FINAL   Final   CULTURE, BLOOD (ROUTINE X 2)     Status: Normal   Collection Time   03/13/12 10:45 PM      Component Value Range Status Comment   Specimen Description BLOOD LEFT ARM   Final    Special Requests BOTTLES  DRAWN AEROBIC ONLY 5CC   Final    Culture  Setup Time 03/14/2012 12:19   Final    Culture     Final    Value: STAPHYLOCOCCUS AUREUS     Note: RIFAMPIN AND GENTAMICIN SHOULD NOT BE USED AS SINGLE DRUGS FOR TREATMENT OF STAPH INFECTIONS.     Note: CRITICAL RESULT CALLED TO, READ BACK BY AND VERIFIED WITH: MANDISIA AT 2141 03/14/12 BY SNOLO   Report Status 03/16/2012 FINAL   Final    Organism ID, Bacteria STAPHYLOCOCCUS AUREUS   Final   URINE CULTURE     Status: Normal   Collection Time   03/16/12  6:51 PM      Component Value Range Status Comment   Specimen Description URINE, RANDOM   Final    Special Requests NONE   Final    Culture  Setup Time 03/16/2012 19:54   Final    Colony Count NO GROWTH   Final    Culture NO GROWTH   Final    Report Status 03/17/2012 FINAL   Final   CULTURE, BLOOD (ROUTINE X 2)     Status: Normal  Collection Time   03/17/12  7:00 AM      Component Value Range Status Comment   Specimen Description BLOOD LEFT HAND   Final    Special Requests BOTTLES DRAWN AEROBIC AND ANAEROBIC 10CC EACH   Final    Culture  Setup Time 03/17/2012 12:45   Final    Culture NO GROWTH 5 DAYS   Final    Report Status 03/23/2012 FINAL   Final   CULTURE, BLOOD (ROUTINE X 2)     Status: Normal   Collection Time   03/17/12  7:00 AM      Component Value Range Status Comment   Specimen Description BLOOD LEFT ARM   Final    Special Requests BOTTLES DRAWN AEROBIC AND ANAEROBIC 10CC EACH   Final    Culture  Setup Time 03/17/2012 12:45   Final    Culture NO GROWTH 5 DAYS   Final    Report Status 03/23/2012 FINAL   Final   CATH TIP CULTURE     Status: Normal   Collection Time   03/17/12  4:27 PM      Component Value Range Status Comment   Specimen Description CATH TIP PICC LINE   Final    Special Requests NONE   Final    Culture     Final    Value: >100 COLONIES STAPHYLOCOCCUS AUREUS     Note: RIFAMPIN AND GENTAMICIN SHOULD NOT BE USED AS SINGLE DRUGS FOR TREATMENT OF STAPH INFECTIONS.     Report Status 03/21/2012 FINAL   Final    Organism ID, Bacteria STAPHYLOCOCCUS AUREUS   Final     Studies/Results: No results found.  Assessment/Plan: 1) MSSA bacteremia - repeat blood cultures negative.  Will need 2 weeks of IV therapy through 10/23.  I am concerned with this rash, though has been long standing and will change to vancomycin, though I suspect if drug related, it is something he has been on.    2) Rash - pruritic, eosinophilia, atypical lymphs, exfoliative dermatitis.  I am concerned with DRESS syndrome.  Steroids are appropriate treatment.  I also have contacted dermatology offices and so far no one to see patient in house.  Once patient is at a SNF, he sheould see as outpatient, asap.    Staci Righter, MD [facility] for Infectious Disease [facility] [phone] pager   03/23/2012, 11:55 AM

## 2012-03-24 LAB — CBC WITH DIFFERENTIAL/PLATELET
Eosinophils Absolute: 0.4 10*3/uL (ref 0.0–0.7)
Eosinophils Relative: 4 % (ref 0–5)
MCH: 26.1 pg (ref 26.0–34.0)
Monocytes Absolute: 0.8 10*3/uL (ref 0.1–1.0)
Neutrophils Relative %: 69 % (ref 43–77)
Platelets: 125 10*3/uL — ABNORMAL LOW (ref 150–400)
RBC: 3.95 MIL/uL — ABNORMAL LOW (ref 4.22–5.81)
WBC: 9.7 10*3/uL (ref 4.0–10.5)

## 2012-03-24 LAB — RENAL FUNCTION PANEL
Albumin: 1.8 g/dL — ABNORMAL LOW (ref 3.5–5.2)
Chloride: 124 mEq/L — ABNORMAL HIGH (ref 96–112)
Creatinine, Ser: 5.13 mg/dL — ABNORMAL HIGH (ref 0.50–1.35)
GFR calc non Af Amer: 11 mL/min — ABNORMAL LOW (ref >90)
Potassium: 5.5 mEq/L — ABNORMAL HIGH (ref 3.5–5.1)
Sodium: 156 mEq/L — ABNORMAL HIGH (ref 135–145)

## 2012-03-24 LAB — GLUCOSE, CAPILLARY: Glucose-Capillary: 226 mg/dL — ABNORMAL HIGH (ref 70–99)

## 2012-03-24 MED ORDER — STERILE WATER FOR INJECTION IV SOLN
INTRAVENOUS | Status: DC
  Administered 2012-03-24 – 2012-03-25 (×4): via INTRAVENOUS
  Filled 2012-03-24 (×11): qty 9.7

## 2012-03-24 MED ORDER — METHYLPREDNISOLONE SODIUM SUCC 125 MG IJ SOLR
100.0000 mg | INTRAMUSCULAR | Status: AC
  Administered 2012-03-24 – 2012-03-26 (×3): 100 mg via INTRAVENOUS
  Filled 2012-03-24 (×3): qty 1.6

## 2012-03-24 NOTE — Progress Notes (Signed)
PA Student Daily Progress Note [facility] Subjective: Pt states he is feeling much worse than yesterday and tells me to go away. When pressed he described bi-lateral hip pain that is a 8/10. Says his skin feels better. Denies headache, dizziness, abd pain, CP, SOB, and nausea. Tells me he feels bad but allow exam; says he is trying to drink more  Objective:  Filed Vitals:   03/23/12 0944 03/23/12 1319 03/23/12 2126 03/24/12 0632  BP: 117/66 113/68 134/70 114/68  Pulse: 82 79 69 70  Temp: 97.9 F (36.6 C) 97.6 F (36.4 C) 97.3 F (36.3 C) 97.5 F (36.4 C)  TempSrc: Oral Oral Oral Oral  Resp: 18 19 20 18   Height:      Weight:    103.4 kg (227 lb 15.3 oz)  SpO2: 97% 98% 100% 99%  I/O last 3 completed shifts: In: 1790 [P.O.:390; I.V.:1400] Out: 1325 [Urine:1325]   Physical Exam: General appearance: resting, mild distress and moderately obese  Lungs: diminished breath sounds bilaterally  Heart: regular rate and rhythm, S1, S2 normal, no murmur, click, rub or gallop  Abdomen: soft, non-tender; bowel sounds normal  Extremities: edema 2+ to calves bilaterally, diabetic foot wound on right foot, callused and dry, no pus  Skin: dry, exfoliation over entire body/arms/legs/face; skin breakdown on buttocks  Neurologic: Grossly normal  GU: Clear, dark, urine from foley catheter   Labs: Basic Metabolic Panel:  Lab 03/24/12 9604 03/23/12 0710 03/22/12 0829  NA 156* 153* 151*  K 5.5* 4.8 4.5  CL 124* 123* 120*  CO2 16* 17* 18*  GLUCOSE 238* 84 114*  BUN 96* 91* 83*  CREATININE 5.13* 4.85* 4.42*  CALCIUM 7.4* 7.8* 7.7*  ALB -- -- --  PHOS 7.6* 5.9* 5.1*   Liver Function Tests:  Lab 03/24/12 0425 03/23/12 0710 03/22/12 0829 03/19/12 0803 03/18/12 0505  AST -- -- -- 139* 150*  ALT -- -- -- 44 78*  ALKPHOS -- -- -- 334* 295*  BILITOT -- -- -- 0.9 0.8  PROT -- -- -- 7.1 6.5  ALBUMIN 1.8* 1.9* 1.9* -- --   CBC:  Lab 03/24/12 0425 03/23/12 0710 03/19/12 0803  03/18/12 0505  WBC 9.7 9.8 11.5* --  NEUTROABS 6.7 5.1 -- --  HGB 10.3* 11.6* 11.8* --  HCT 31.7* 35.7* 35.5* --  MCV 80.3 80.0 79.8 79.4  PLT 125* 153 155 --   CBG:  Lab 03/24/12 0633 03/23/12 2018 03/23/12 1606 03/23/12 1152 03/23/12 0731  GLUCAP 234* 188* 122* 77 85   Medications: Scheduled Meds:   . antiseptic oral rinse  15 mL Mouth Rinse q12n4p  . aspirin EC  81 mg Oral Daily  . chlorhexidine  15 mL Mouth Rinse BID  . heparin  5,000 Units Subcutaneous Q8H  . insulin aspart  0-15 Units Subcutaneous TID WC  . insulin glargine  10 Units Subcutaneous QHS  . pantoprazole  40 mg Oral Q0600  . predniSONE  60 mg Oral Q breakfast  . sodium chloride  3 mL Intravenous Q12H  . triamcinolone cream   Topical BID  . vancomycin  1,500 mg Intravenous Q48H  . vancomycin  2,000 mg Intravenous Once  . DISCONTD:  ceFAZolin (ANCEF) IV  1 g Intravenous Q12H  . DISCONTD: cephALEXin  500 mg Oral Q8H  . DISCONTD: insulin aspart  0-15 Units Subcutaneous Q4H   Continuous Infusions:   . dextrose 75 mL/hr at 03/22/12 1343   PRN Meds:.acetaminophen, diphenhydrAMINE, ondansetron (ZOFRAN) IV, ondansetron  Assessment/Plan:  Mr. [name] is a 66 yo AA male with a PMH of CKD stage 2, CHF, HTN, DM2, and hypercholesteremia. Baseline Cr. Of 1.77 on 02/24/11. 2.2 on admission for uncontrolled DM and MSSA bacteremia with continued worsening, in the setting of an exfoliative erythroderma and significant peripheral eosinophilia.  1. Acute on CRF- cr 5.13 today (4.85 yesterday) Low FeNa suggested prerenal/low intravasc volume at time of initial presentation. Initially thought to be due to low BP and poor perfusion Peripheral edema/evidence volume overload but intravascular volume status tough to assess  (Renal function worsened with diuretics, continues to worsen with D/C of diuretics and administration of IVF)  With skin rash and eosinophilia concerned about allergic interstitial nephritis as possible  contributor to kidney injury  Steroids for skin started yesterday - See #9 will give as solumedrol for 3 days, then convert to a slightly higher dose of prednisone Do not favor renal biopsy at this point  urine for eosinophils was not collected yesterday. Will speak to nurse.  IV restarted yesterday May require dialysis if renal function continues to deteriorate  2. CHF- diast - normal EF 60%  3. HTN- pt has been hypotensive on admission, BP meds stopped Normotensive at present   4. DM2- Per primary  5. Hypoalbuminemia- continue per primary team.   6. MSSA bacteremia- TEE negative. On Ancef.   7. N/V/D- initial presentation, resolved.   8. Hypernatremia- D5W increased on 10/14 to 75/hr without improvement in NA. Is stable today at 156- will continue to monitor. See #1 Increase fluids to 100 hour and add an amp of sodium bicarb to each liter due to evolving acidosis  9. Diffuse exfoliative skin rash - Per ID who had a phone consult with Dr. Terri Piedra of derm. " he feels less likely to be consistent with DRESS syndrome and more related to medications, such as antibiotics, though not a typical reaction. He also suggested triamcinolone bid, oral steroids (as you are doing) up to 1mg /kg/day and hydroxizine for pruritis. He also will see the patient as an outpatient right after discharge. Appt can be made at [phone] and they will get him in soon". In my opinion a dermatologist still needs to physically see the patient.  - will increase steroids  - will start triamcinoline BID (done by primary)  - plan for out patient follow-up immediately following discharge.    This is a PA Student Note.  The care of the patient was discussed with Dr. Eliott Nine and the assessment and plan formulated with their assistance.  Please see their attached note for official documentation of the daily encounter.  Aniceto Boss, MA, PA-S2 03/24/2012, 8:22 AM  I have seen and examined this patient and agree with plan as  above. Renal function continues to worsen On steroids (day 2) for possibility of acute interstitial nephritis Will give IV solumedrol for 3 days then back to oral prednisone at higher dose Offending agent not known (cephalosporins stopped - may or may not be culprit) He may require dialysis with further deterioration bu no indications as of yet Change fluids to 1/4 ns with 1 amp bicarb/liter at 100 hour (HYPERnatremia and metabolic acidosis).  Nygel Prokop B,MD 03/24/2012 8:49 AM

## 2012-03-24 NOTE — Progress Notes (Signed)
[facility]  Date of Admission:  03/09/2012  Antibiotics: Ancef 10/9 -  Ceftriaxone 10/6-8 Vancomycin 10/16 -   Subjective: Diffuse rash, exfoliative dermatitis  Objective: Temp:  [97.3 F (36.3 C)-97.8 F (36.6 C)] 97.8 F (36.6 C) (10/17 1452) Pulse Rate:  [69-78] 72  (10/17 1452) Resp:  [18-20] 20  (10/17 1452) BP: (114-139)/(57-72) 131/72 mmHg (10/17 1452) SpO2:  [99 %-100 %] 99 % (10/17 1452) Weight:  [227 lb 15.3 oz (103.4 kg)] 227 lb 15.3 oz (103.4 kg) (10/17 1610)  General: Awake, nad Skin: diffuse exfoliative rash Lungs: CTA B Cor: RRR   Lab Results Lab Results  Component Value Date   WBC 9.7 03/24/2012   HGB 10.3* 03/24/2012   HCT 31.7* 03/24/2012   MCV 80.3 03/24/2012   PLT 125* 03/24/2012    Lab Results  Component Value Date   CREATININE 5.13* 03/24/2012   BUN 96* 03/24/2012   NA 156* 03/24/2012   K 5.5* 03/24/2012   CL 124* 03/24/2012   CO2 16* 03/24/2012    Lab Results  Component Value Date   ALT 44 03/19/2012   AST 139* 03/19/2012   ALKPHOS 334* 03/19/2012   BILITOT 0.9 03/19/2012      Microbiology: Recent Results (from the past 240 hour(s))  URINE CULTURE     Status: Normal   Collection Time   03/16/12  6:51 PM      Component Value Range Status Comment   Specimen Description URINE, RANDOM   Final    Special Requests NONE   Final    Culture  Setup Time 03/16/2012 19:54   Final    Colony Count NO GROWTH   Final    Culture NO GROWTH   Final    Report Status 03/17/2012 FINAL   Final   CULTURE, BLOOD (ROUTINE X 2)     Status: Normal   Collection Time   03/17/12  7:00 AM      Component Value Range Status Comment   Specimen Description BLOOD LEFT HAND   Final    Special Requests BOTTLES DRAWN AEROBIC AND ANAEROBIC 10CC EACH   Final    Culture  Setup Time 03/17/2012 12:45   Final    Culture NO GROWTH 5 DAYS   Final    Report Status 03/23/2012 FINAL   Final   CULTURE, BLOOD (ROUTINE X 2)     Status: Normal    Collection Time   03/17/12  7:00 AM      Component Value Range Status Comment   Specimen Description BLOOD LEFT ARM   Final    Special Requests BOTTLES DRAWN AEROBIC AND ANAEROBIC 10CC EACH   Final    Culture  Setup Time 03/17/2012 12:45   Final    Culture NO GROWTH 5 DAYS   Final    Report Status 03/23/2012 FINAL   Final   CATH TIP CULTURE     Status: Normal   Collection Time   03/17/12  4:27 PM      Component Value Range Status Comment   Specimen Description CATH TIP PICC LINE   Final    Special Requests NONE   Final    Culture     Final    Value: >100 COLONIES STAPHYLOCOCCUS AUREUS     Note: RIFAMPIN AND GENTAMICIN SHOULD NOT BE USED AS SINGLE DRUGS FOR TREATMENT OF STAPH INFECTIONS.   Report Status 03/21/2012 FINAL   Final    Organism ID, Bacteria STAPHYLOCOCCUS AUREUS  Final     Studies/Results: No results found.  Assessment/Plan: 1) MSSA bacteremia - repeat blood cultures negative.  Will need 2 weeks of IV therapy through 10/23. Now on vancomycin.    2) Rash - pruritic, eosinophilia, atypical lymphs, exfoliative dermatitis.  Close follow up with dermatology.    Staci Righter, MD [facility] for Infectious Disease [facility] [phone] pager   03/24/2012, 3:45 PM

## 2012-03-24 NOTE — Progress Notes (Signed)
Utilization Review Completed.  

## 2012-03-24 NOTE — Progress Notes (Signed)
Subjective:  Patient states he feels little better. Itching has improved Denies any chest pain or shortness of breath. Renal function progressively getting worst and albumin remains low. Will discuss with renal service regarding albumin transfusion in view of progressive hypernatremia and intravascular volume depletion Objective:  Vital Signs in the last 24 hours: Temp:  [97.3 F (36.3 C)-97.9 F (36.6 C)] 97.5 F (36.4 C) (10/17 4540) Pulse Rate:  [69-82] 70  (10/17 0632) Resp:  [18-20] 18  (10/17 9811) BP: (113-134)/(66-70) 114/68 mmHg (10/17 0632) SpO2:  [97 %-100 %] 99 % (10/17 9147) Weight:  [103.4 kg (227 lb 15.3 oz)] 103.4 kg (227 lb 15.3 oz) (10/17 8295)  Intake/Output from previous day: 10/16 0701 - 10/17 0700 In: 1340 [P.O.:390; I.V.:950] Out: 850 [Urine:850] Intake/Output from this shift:    Physical Exam: Neck: no adenopathy, no carotid bruit, no JVD and supple, symmetrical, trachea midline Lungs: clear to auscultation bilaterally Heart: regular rate and rhythm, S1, S2 normal and Soft systolic murmur noted Abdomen: soft, non-tender; bowel sounds normal; no masses,  no organomegaly Extremities: No clubbing cyanosis 3+ edema. Diffuse exfoliative rash noted  Lab Results:  Basename 03/24/12 0425 03/23/12 0710  WBC 9.7 9.8  HGB 10.3* 11.6*  PLT 125* 153    Basename 03/24/12 0425 03/23/12 0710  NA 156* 153*  K 5.5* 4.8  CL 124* 123*  CO2 16* 17*  GLUCOSE 238* 84  BUN 96* 91*  CREATININE 5.13* 4.85*   No results found for this basename: TROPONINI:2,CK,MB:2 in the last 72 hours Hepatic Function Panel  Basename 03/24/12 0425  PROT --  ALBUMIN 1.8*  AST --  ALT --  ALKPHOS --  BILITOT --  BILIDIR --  IBILI --   No results found for this basename: CHOL in the last 72 hours No results found for this basename: PROTIME in the last 72 hours  Imaging: Imaging results have been reviewed and No results found.  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

## 2012-03-25 LAB — RENAL FUNCTION PANEL
Albumin: 1.7 g/dL — ABNORMAL LOW (ref 3.5–5.2)
Calcium: 7 mg/dL — ABNORMAL LOW (ref 8.4–10.5)
Chloride: 120 mEq/L — ABNORMAL HIGH (ref 96–112)
Creatinine, Ser: 4.88 mg/dL — ABNORMAL HIGH (ref 0.50–1.35)
GFR calc non Af Amer: 11 mL/min — ABNORMAL LOW (ref >90)
Phosphorus: 6 mg/dL — ABNORMAL HIGH (ref 2.3–4.6)

## 2012-03-25 LAB — GLUCOSE, CAPILLARY
Glucose-Capillary: 246 mg/dL — ABNORMAL HIGH (ref 70–99)
Glucose-Capillary: 286 mg/dL — ABNORMAL HIGH (ref 70–99)
Glucose-Capillary: 342 mg/dL — ABNORMAL HIGH (ref 70–99)

## 2012-03-25 LAB — VANCOMYCIN, RANDOM: Vancomycin Rm: 18.9 ug/mL

## 2012-03-25 MED ORDER — TRIAMCINOLONE ACETONIDE 0.5 % EX CREA: TOPICAL_CREAM | Freq: Two times a day (BID) | CUTANEOUS | Status: DC

## 2012-03-25 MED ORDER — SODIUM POLYSTYRENE SULFONATE 15 GM/60ML PO SUSP
30.0000 g | Freq: Once | ORAL | Status: AC
  Administered 2012-03-25: 30 g via ORAL
  Filled 2012-03-25: qty 120

## 2012-03-25 MED ORDER — GLUCERNA SHAKE PO LIQD
237.0000 mL | Freq: Three times a day (TID) | ORAL | Status: DC
  Administered 2012-03-25 – 2012-04-01 (×16): 237 mL via ORAL
  Filled 2012-03-25 (×3): qty 237

## 2012-03-25 MED ORDER — TRIAMCINOLONE ACETONIDE 0.5 % EX CREA
TOPICAL_CREAM | Freq: Two times a day (BID) | CUTANEOUS | Status: DC
  Administered 2012-03-25: 1 via TOPICAL
  Administered 2012-03-25 – 2012-03-28 (×6): via TOPICAL
  Administered 2012-03-28: 1 via TOPICAL
  Administered 2012-03-29 – 2012-04-01 (×7): via TOPICAL
  Filled 2012-03-25 (×10): qty 60

## 2012-03-25 NOTE — Progress Notes (Signed)
PT Cancellation Note  Patient Details Name: [name] MRN: [id number] DOB: 05/26/1946   Cancelled Treatment:     Pt adamantly refusing participation in PT session at this time despite encouragement.  Becomes irritated with encouragement.      Verdell Face, [place] [phone] 03/25/2012

## 2012-03-25 NOTE — Progress Notes (Signed)
PA Student Daily Progress Note [facility] Subjective: Pt sleeping and tells me to "go away". States he is in pain. His wife is at bedside and tells me that he reports PT was "rough" with him this morning. He tells me his skin hurts and he does not want me to move him. He denies headache, CP, SOB, and abd pain. His wife states that his tongue hurts and he has not wanted to eat or drink because of it. Overall PO intake is poor.  Objective:  Filed Vitals:   03/24/12 1027 03/24/12 1452 03/24/12 2037 03/25/12 0700  BP: 139/57 131/72 149/59 156/71  Pulse: 78 72 65 72  Temp: 97.7 F (36.5 C) 97.8 F (36.6 C) 97.3 F (36.3 C) 98.3 F (36.8 C)  TempSrc: Oral Oral Oral Oral  Resp: 20 20 18 20   Height:      Weight:    103.3 kg (227 lb 11.8 oz)  SpO2: 100% 99% 100% 100%  I/O last 3 completed shifts: In: 3971.7 [P.O.:1080; I.V.:2891.7] Out: 1450 [Urine:1450]    Physical Exam: General appearance: resting, mild distress and moderately obese  Heart: regular rate and rhythm, S1, S2 normal, no murmur, click, rub or gallop  Abdomen: soft, non-tender; Extremities: edema 2+ to calves bilaterally, diabetic foot wound on right foot, callused and dry, no pus  Skin: dry, exfoliation over entire body/arms/legs/face; skin breakdown on buttocks  Neurologic: Grossly normal  GU: Cloudy, dark, urine with sediment from foley catheter Labs: Basic Metabolic Panel:  Lab 03/25/12 1610 03/24/12 0425 03/23/12 0710  NA 151* 156* 153*  K 5.7* 5.5* 4.8  CL 120* 124* 123*  CO2 19 16* 17*  GLUCOSE 352* 238* 84  BUN 100* 96* 91*  CREATININE 4.88* 5.13* 4.85*  CALCIUM 7.0* 7.4* 7.8*  ALB -- -- --  PHOS 6.0* 7.6* 5.9*   Liver Function Tests:  Lab 03/25/12 0630 03/24/12 0425 03/23/12 0710 03/19/12 0803  AST -- -- -- 139*  ALT -- -- -- 44  ALKPHOS -- -- -- 334*  BILITOT -- -- -- 0.9  PROT -- -- -- 7.1  ALBUMIN 1.7* 1.8* 1.9* --   CBC:  Lab 03/24/12 0425 03/23/12 0710 03/19/12 0803  WBC  9.7 9.8 11.5*  NEUTROABS 6.7 5.1 --  HGB 10.3* 11.6* 11.8*  HCT 31.7* 35.7* 35.5*  MCV 80.3 80.0 79.8  PLT 125* 153 155   CBG:  Lab 03/24/12 2148 03/24/12 1554 03/24/12 1142 03/24/12 0633 03/23/12 2018  GLUCAP 303* 186* 226* 234* 188*   Scheduled Meds:   . antiseptic oral rinse  15 mL Mouth Rinse q12n4p  . aspirin EC  81 mg Oral Daily  . chlorhexidine  15 mL Mouth Rinse BID  . heparin  5,000 Units Subcutaneous Q8H  . insulin aspart  0-15 Units Subcutaneous TID WC  . insulin glargine  10 Units Subcutaneous QHS  . methylPREDNISolone (SOLU-MEDROL) injection  100 mg Intravenous Q24H  . pantoprazole  40 mg Oral Q0600  . sodium chloride  3 mL Intravenous Q12H  . sodium polystyrene  30 g Oral Once  . triamcinolone cream   Topical BID  . vancomycin  1,500 mg Intravenous Q48H   Continuous Infusions:   .  sodium bicarbonate infusion 1/4 NS 1000 mL 100 mL/hr at 03/25/12 0937   PRN Meds:.acetaminophen, diphenhydrAMINE, ondansetron (ZOFRAN) IV, ondansetron   Assessment/Recommendations  Mr. Sciortino is a 66 yo AA male with a PMH of CKD stage 2, CHF, HTN, DM2, and hypercholesteremia. Baseline Cr. Of  1.77 on 02/24/11. 2.2 on admission for uncontrolled DM and MSSA bacteremia with continued worsening, in the setting of an exfoliative erythroderma and significant peripheral eosinophilia.   1. Acute on CRF- cr 4.88 today (5.13 yesterday)  Low FeNa suggested prerenal/low intravasc volume at time of initial presentation. Initially thought to be due to low BP and poor perfusion  Peripheral edema/evidence volume overload but intravascular volume status tough to assess  (Renal function worsened with diuretics, continues to worsen with D/C of diuretics and administration of IVF)  With skin rash and eosinophilia concerned about allergic interstitial nephritis as possible contributor to kidney injury however do not favor renal biopsy at this point Steroids for skin started Wed. Yesterday was first day  of IV solumedrol. Will continue total of 3 days of solumedrol and then convert to PO doses of prednisone. urine for eosinophils pending May require dialysis if renal function continues to deteriorate or develops uremic symptoms  2. CHF- diast - normal EF 60%  3. HTN- pt had been hypotensive on admission, BP meds stopped. BP is 156/71 at present. May need to restart meds. Continue to monitor closely  4. DM2- Per primary  5. Hypoalbuminemia- continue per primary team.  6. MSSA bacteremia- TEE negative. On Vanc given possible cephalosporin reaction 7. N/V/D- initial presentation, resolved.  8. Hypernatremia- improved today at 151 (156 yesterday) continue - fluids at 100 hour with an amp of sodium bicarb to each liter  9. Diffuse exfoliative skin rash   continue steroids,  triamcinoline BID changed to Kenalog cream per primary service.  It needs to be applied to ENTIRE BODY - this was not done yesterday with the triamcinoline cream as the very small tube of cream was still half empty. Spoke to nursing plan for out patient follow-up immediately following discharge.  10. Hyperkalemia- pts potassium is 5.7 today. Kayexalate 30g ordered continue to monitor  This is a PA Student Note.  The care of the patient was discussed with Dr. Eliott Nine and the assessment and plan formulated with their assistance.  Please see their attached note for official documentation of the daily encounter.  Aniceto Boss, MA, PA-S2 03/25/2012, 11:02 AM Patient seen and examined.   Agree with above Renal function about the same (1st day have not seen rise in creatinine) Vol stable (ECFV excess but 3rd spacing fluids d/t low albumin) Day 2 IV SoluMedrol Skin looks a little less red Still diffuse exfoliation Hopeful renal function will improve with steroids - if this is a drug induced interstitital nephritis it should (biopsy would be only way to tell but not inclined to do at this time since needs steroids for skin  anyway Would consider placement of a feeding tube for nutrition as po is poor Discussed renal issues and possible need for dialysis with wife  Marijke Guadiana B

## 2012-03-25 NOTE — Progress Notes (Signed)
INITIAL ADULT NUTRITION ASSESSMENT Date: 03/25/2012   Time: 11:13 AM Reason for Assessment: MST (Malnutrition Screening Tool)  INTERVENTION: 1. Recommend change Glucerna TID to Nepro shake BID 2. Will add [place] Ice as snacks to be stocked on floor.  3. RD will continue to follow    DOCUMENTATION CODES Per approved criteria  -Obesity Unspecified     ASSESSMENT: Male 66 y.o.  Dx: MSSA bacteremia, Rash   Hx:  Past Medical History  Diagnosis Date  . Diabetes mellitus   . Hypertension   . Hyperlipidemia   . CHF (congestive heart failure)   . Enlarged heart   . Pneumonia     hx of     Related Meds:     . antiseptic oral rinse  15 mL Mouth Rinse q12n4p  . aspirin EC  81 mg Oral Daily  . chlorhexidine  15 mL Mouth Rinse BID  . heparin  5,000 Units Subcutaneous Q8H  . insulin aspart  0-15 Units Subcutaneous TID WC  . insulin glargine  10 Units Subcutaneous QHS  . methylPREDNISolone (SOLU-MEDROL) injection  100 mg Intravenous Q24H  . pantoprazole  40 mg Oral Q0600  . sodium chloride  3 mL Intravenous Q12H  . sodium polystyrene  30 g Oral Once  . triamcinolone cream   Topical BID  . vancomycin  1,500 mg Intravenous Q48H     Ht: 5\' 11"  (180.3 cm)  Wt: 227 lb 11.8 oz (103.3 kg) (bedscale)  Ideal Wt: 78.2 kg  % Ideal Wt: 131 kg   Usual Wt:  Wt Readings from Last 5 Encounters:  03/25/12 227 lb 11.8 oz (103.3 kg)  03/25/12 227 lb 11.8 oz (103.3 kg)  03/25/12 227 lb 11.8 oz (103.3 kg)  02/24/12 239 lb 3.2 oz (108.5 kg)  08/07/11 238 lb 8.6 oz (108.2 kg)    % Usual Wt: 95%  Body mass index is 31.76 kg/(m^2). pt is obese class 1 per current BMI   Food/Nutrition Related Hx: pt has been eating poorly this admission  Labs:  CMP     Component Value Date/Time   NA 151* 03/25/2012 0630   K 5.7* 03/25/2012 0630   CL 120* 03/25/2012 0630   CO2 19 03/25/2012 0630   GLUCOSE 352* 03/25/2012 0630   BUN 100* 03/25/2012 0630   CREATININE 4.88* 03/25/2012 0630   CALCIUM 7.0* 03/25/2012 0630   PROT 7.1 03/19/2012 0803   ALBUMIN 1.7* 03/25/2012 0630   AST 139* 03/19/2012 0803   ALT 44 03/19/2012 0803   ALKPHOS 334* 03/19/2012 0803   BILITOT 0.9 03/19/2012 0803   GFRNONAA 11* 03/25/2012 0630   GFRAA 13* 03/25/2012 0630   Sodium  Date/Time Value Range Status  03/25/2012  6:30 AM 151* 135 - 145 mEq/L Final  03/24/2012  4:25 AM 156* 135 - 145 mEq/L Final  03/23/2012  7:10 AM 153* 135 - 145 mEq/L Final    Potassium  Date/Time Value Range Status  03/25/2012  6:30 AM 5.7* 3.5 - 5.1 mEq/L Final  03/24/2012  4:25 AM 5.5* 3.5 - 5.1 mEq/L Final  03/23/2012  7:10 AM 4.8  3.5 - 5.1 mEq/L Final    Phosphorus  Date/Time Value Range Status  03/25/2012  6:30 AM 6.0* 2.3 - 4.6 mg/dL Final  29/56/2130  8:65 AM 7.6* 2.3 - 4.6 mg/dL Final  78/46/9629  5:28 AM 5.9* 2.3 - 4.6 mg/dL Final    Magnesium  Date/Time Value Range Status  03/09/2012  4:40 PM 2.6* 1.5 - 2.5 mg/dL Final  02/24/2012  5:25 AM 2.4  1.5 - 2.5 mg/dL Final  1/61/0960  4:54 PM 2.1  1.5 - 2.5 mg/dL Final      Intake/Output Summary (Last 24 hours) at 03/25/12 1116 Last data filed at 03/25/12 0981  Gross per 24 hour  Intake 2471.67 ml  Output    800 ml  Net 1671.67 ml     Diet Order: Renal 80/90 no fluid limit   Supplements/Tube Feeding: none   IVF:    sodium bicarbonate infusion 1/4 NS 1000 mL Last Rate: 100 mL/hr at 03/25/12 1914    Estimated Nutritional Needs:   Kcal: 2300-2500 Protein: 68-80 gm  Fluid:  2.3-2.5 L   Family reports pt is not eating well, and taking in very little fluids. State that the MD said he could have Glucerna. Pt also wants ice pops or other cold snacks.  Weight is stable with admission weight.  Po intake has been variable, mostly less then 50% of meals.  Recommend change Glucerna to Nepro BID as pt with elevated K+, Na+, Phosphorous 2/2 acute on chronic renal insufficiency.   NUTRITION DIAGNOSIS: -Inadequate oral intake (NI-2.1).  Status:  Ongoing  RELATED TO: poor appetite  AS EVIDENCE BY: 50% meal completion   MONITORING/EVALUATION(Goals): Goal: PO intake of meals and supplements to meet >905 estimated nutrition needs  Monitor: PO intake, weight, labs, initiation of HD?   EDUCATION NEEDS: -No education needs identified at this time   Clarene Duke RD, LDN Pager [phone] After Hours pager [phone]  03/25/2012, 11:13 AM

## 2012-03-25 NOTE — Progress Notes (Signed)
Subjective:  Patient denies any chest pain or shortness of breath. Complains of excoriation in the axillary region rash. Blood sugar are elevated secondary to steroids  Objective:  Vital Signs in the last 24 hours: Temp:  [97.3 F (36.3 C)-98.3 F (36.8 C)] 98.3 F (36.8 C) (10/18 0700) Pulse Rate:  [65-72] 72  (10/18 0700) Resp:  [18-20] 20  (10/18 0700) BP: (131-156)/(59-72) 156/71 mmHg (10/18 0700) SpO2:  [99 %-100 %] 100 % (10/18 0700) Weight:  [103.3 kg (227 lb 11.8 oz)] 103.3 kg (227 lb 11.8 oz) (10/18 0700)  Intake/Output from previous day: 10/17 0701 - 10/18 0700 In: 2831.7 [P.O.:840; I.V.:1991.7] Out: 900 [Urine:900] Intake/Output from this shift:    Physical Exam: Neck: no adenopathy, no carotid bruit, no JVD and supple, symmetrical, trachea midline Lungs: clear to auscultation bilaterally Heart: regular rate and rhythm, S1, S2 normal and Soft systolic murmur noted no S3 gallop Abdomen: soft, non-tender; bowel sounds normal; no masses,  no organomegaly Extremities: extremities normal, atraumatic, no cyanosis or edema and Diffuse exfoliative rash noted  Lab Results:  Basename 03/24/12 0425 03/23/12 0710  WBC 9.7 9.8  HGB 10.3* 11.6*  PLT 125* 153    Basename 03/25/12 0630 03/24/12 0425  NA 151* 156*  K 5.7* 5.5*  CL 120* 124*  CO2 19 16*  GLUCOSE 352* 238*  BUN 100* 96*  CREATININE 4.88* 5.13*   No results found for this basename: TROPONINI:2,CK,MB:2 in the last 72 hours Hepatic Function Panel  Basename 03/25/12 0630  PROT --  ALBUMIN 1.7*  AST --  ALT --  ALKPHOS --  BILITOT --  BILIDIR --  IBILI --   No results found for this basename: CHOL in the last 72 hours No results found for this basename: PROTIME in the last 72 hours  Imaging: Imaging results have been reviewed and No results found.  Cardiac Studies:  Assessment/Plan:  Staph aureus Methylin sensitive bacteremia probably from skin /catheter related sepsis  Progressive Acute on  chronic renal insufficiency stage IV multifactorial rule out acute interstitial nephritis /nephrotic syndrome  Status post hypotension secondary to medication  dehydration  Diabetes mellitus uncontrolled Hypertension  Nonischemic cardiomyopathy  Compensated systolic/diastolic heart failure heart failure  Morbid obesity  Hypoalbuminemia  Exfoliative dermatitis Hyperkalemia/metabolic acidosis Plan Continue present management Rx for hyperkalemia as per renal service ordered Glucerna per orders Out of bed to chair Encourage ambulation Apply Kenalog cream deliberately twice a day  LOS: 16 days    Josalin Carneiro N 03/25/2012, 11:09 AM

## 2012-03-25 NOTE — Progress Notes (Signed)
Inpatient Diabetes Program Recommendations  AACE/ADA: New Consensus Statement on Inpatient Glycemic Control (2013)  Target Ranges:  Prepandial:   less than 140 mg/dL      Peak postprandial:   less than 180 mg/dL (1-2 hours)      Critically ill patients:  140 - 180 mg/dL   Inpatient Diabetes Program Recommendations Insulin - Basal: .Increase Lantus to 15 units if CBGs continue greater than 180 mg/dl Correction (SSI): Change to RESISTANT TIDac + HS scale during steroid therapy HgbA1C: .=10.5  Thank you  Piedad Climes [facility] Inpatient Diabetes Coordinator [phone] 8am-5pm [phone] after 5pm

## 2012-03-25 NOTE — Progress Notes (Signed)
ANTIBIOTIC CONSULT NOTE - follow up Pharmacy Consult for vancomycin Indication: MSSA bacteremia   No Known Allergies  Patient Measurements: Height: 5\' 11"  (180.3 cm) Weight: 227 lb 11.8 oz (103.3 kg) (bedscale) IBW/kg (Calculated) : 75.3   Vital Signs: Temp: 97.9 F (36.6 C) (10/18 1358) Temp src: Oral (10/18 1358) BP: 170/90 mmHg (10/18 1358) Pulse Rate: 84  (10/18 1358) Intake/Output from previous day: 10/17 0701 - 10/18 0700 In: 2831.7 [P.O.:840; I.V.:1991.7] Out: 900 [Urine:900] Intake/Output from this shift: Total I/O In: 825 [P.O.:30; I.V.:795] Out: 1 [Stool:1]  Labs:  [facility] 03/25/12 0630 03/24/12 0425 03/23/12 0710  WBC -- 9.7 9.8  HGB -- 10.3* 11.6*  PLT -- 125* 153  LABCREA -- -- --  CREATININE 4.88* 5.13* 4.85*   Estimated Creatinine Clearance: 18 ml/min (by C-G formula based on Cr of 4.88).  Basename 03/25/12 1618  VANCOTROUGH --  Leodis Binet --  VANCORANDOM 18.9  GENTTROUGH --  GENTPEAK --  GENTRANDOM --  TOBRATROUGH --  TOBRAPEAK --  TOBRARND --  AMIKACINPEAK --  AMIKACINTROU --  AMIKACIN --     Microbiology: Recent Results (from the past 720 hour(s))  CLOSTRIDIUM DIFFICILE BY PCR     Status: Normal   Collection Time   03/09/12  8:30 PM      Component Value Range Status Comment   C difficile by pcr NEGATIVE  NEGATIVE Final   CULTURE, BLOOD (ROUTINE X 2)     Status: Normal   Collection Time   03/13/12 10:45 PM      Component Value Range Status Comment   Specimen Description BLOOD LEFT ARM   Final    Special Requests     Final    Value: BOTTLES DRAWN AEROBIC AND ANAEROBIC 7CC BLUE,5CC RED   Culture  Setup Time 03/14/2012 12:19   Final    Culture     Final    Value: STAPHYLOCOCCUS AUREUS     Note: SUSCEPTIBILITIES PERFORMED ON PREVIOUS CULTURE WITHIN THE LAST 5 DAYS.     Note: CRITICAL RESULT CALLED TO, READ BACK BY AND VERIFIED WITH: MANDISIA AT 2140 03/14/12 BY SNOLO   Report Status 03/16/2012 FINAL   Final   CULTURE, BLOOD (ROUTINE X  2)     Status: Normal   Collection Time   03/13/12 10:45 PM      Component Value Range Status Comment   Specimen Description BLOOD LEFT ARM   Final    Special Requests BOTTLES DRAWN AEROBIC ONLY 5CC   Final    Culture  Setup Time 03/14/2012 12:19   Final    Culture     Final    Value: STAPHYLOCOCCUS AUREUS     Note: RIFAMPIN AND GENTAMICIN SHOULD NOT BE USED AS SINGLE DRUGS FOR TREATMENT OF STAPH INFECTIONS.     Note: CRITICAL RESULT CALLED TO, READ BACK BY AND VERIFIED WITH: MANDISIA AT 2141 03/14/12 BY SNOLO   Report Status 03/16/2012 FINAL   Final    Organism ID, Bacteria STAPHYLOCOCCUS AUREUS   Final   URINE CULTURE     Status: Normal   Collection Time   03/16/12  6:51 PM      Component Value Range Status Comment   Specimen Description URINE, RANDOM   Final    Special Requests NONE   Final    Culture  Setup Time 03/16/2012 19:54   Final    Colony Count NO GROWTH   Final    Culture NO GROWTH   Final    Report Status  03/17/2012 FINAL   Final   CULTURE, BLOOD (ROUTINE X 2)     Status: Normal   Collection Time   03/17/12  7:00 AM      Component Value Range Status Comment   Specimen Description BLOOD LEFT HAND   Final    Special Requests BOTTLES DRAWN AEROBIC AND ANAEROBIC 10CC EACH   Final    Culture  Setup Time 03/17/2012 12:45   Final    Culture NO GROWTH 5 DAYS   Final    Report Status 03/23/2012 FINAL   Final   CULTURE, BLOOD (ROUTINE X 2)     Status: Normal   Collection Time   03/17/12  7:00 AM      Component Value Range Status Comment   Specimen Description BLOOD LEFT ARM   Final    Special Requests BOTTLES DRAWN AEROBIC AND ANAEROBIC 10CC EACH   Final    Culture  Setup Time 03/17/2012 12:45   Final    Culture NO GROWTH 5 DAYS   Final    Report Status 03/23/2012 FINAL   Final   CATH TIP CULTURE     Status: Normal   Collection Time   03/17/12  4:27 PM      Component Value Range Status Comment   Specimen Description CATH TIP PICC LINE   Final    Special Requests NONE    Final    Culture     Final    Value: >100 COLONIES STAPHYLOCOCCUS AUREUS     Note: RIFAMPIN AND GENTAMICIN SHOULD NOT BE USED AS SINGLE DRUGS FOR TREATMENT OF STAPH INFECTIONS.   Report Status 03/21/2012 FINAL   Final    Organism ID, Bacteria STAPHYLOCOCCUS AUREUS   Final      Assessment: 66 yo man on vancomycin for MSSA bacteremia 2nd  to rash potentially caused by ancef. 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  Per ID consult he will need 2 weeks of IV tx through 10/23.  He was given vancomycin 2000 mg on 10/16 at 1759 and his random vanc level today at 1618 is 18.9.  This level is NOT steady-state but is within the desired range of 15-20.  He was started on his maintenance dose of 1500 mg IV q48 today at 1651.  Goal of Therapy:  Vancomycin trough level 15-20 mcg/ml  Plan:  Continue maintenance dose of  1500 mg IV q48 hours.  Will f/u renal function and clinical course. Check vanc level when appropriate.  Thanks for allowing pharmacy to be a part of this patient's care.  Herby Abraham, Pharm.D. [phone] 03/25/2012 5:41 PM

## 2012-03-26 ENCOUNTER — Inpatient Hospital Stay (HOSPITAL_COMMUNITY): Payer: Medicare Other

## 2012-03-26 LAB — CBC WITH DIFFERENTIAL/PLATELET
Basophils Absolute: 0.1 10*3/uL (ref 0.0–0.1)
Eosinophils Relative: 3 % (ref 0–5)
Monocytes Absolute: 0.3 10*3/uL (ref 0.1–1.0)
Monocytes Relative: 4 % (ref 3–12)
Neutrophils Relative %: 70 % (ref 43–77)
Platelets: 104 10*3/uL — ABNORMAL LOW (ref 150–400)
RBC: 4.22 MIL/uL (ref 4.22–5.81)
WBC: 8.4 10*3/uL (ref 4.0–10.5)

## 2012-03-26 LAB — RENAL FUNCTION PANEL
Albumin: 1.9 g/dL — ABNORMAL LOW (ref 3.5–5.2)
Chloride: 120 mEq/L — ABNORMAL HIGH (ref 96–112)
GFR calc non Af Amer: 12 mL/min — ABNORMAL LOW (ref >90)
Potassium: 5.2 mEq/L — ABNORMAL HIGH (ref 3.5–5.1)
Sodium: 151 mEq/L — ABNORMAL HIGH (ref 135–145)

## 2012-03-26 LAB — GLUCOSE, CAPILLARY
Glucose-Capillary: 350 mg/dL — ABNORMAL HIGH (ref 70–99)
Glucose-Capillary: 396 mg/dL — ABNORMAL HIGH (ref 70–99)

## 2012-03-26 MED ORDER — INSULIN ASPART 100 UNIT/ML ~~LOC~~ SOLN
20.0000 [IU] | Freq: Once | SUBCUTANEOUS | Status: AC
  Administered 2012-03-26: 20 [IU] via SUBCUTANEOUS

## 2012-03-26 MED ORDER — SODIUM CHLORIDE 0.9 % IJ SOLN
10.0000 mL | INTRAMUSCULAR | Status: DC | PRN
  Administered 2012-03-26 (×3): 10 mL
  Administered 2012-03-27: 20 mL
  Administered 2012-03-28: 10 mL
  Administered 2012-03-28: 30 mL
  Administered 2012-03-28 – 2012-03-29 (×2): 10 mL

## 2012-03-26 MED ORDER — INSULIN GLARGINE 100 UNIT/ML ~~LOC~~ SOLN
15.0000 [IU] | Freq: Every day | SUBCUTANEOUS | Status: DC
  Administered 2012-03-26: 15 [IU] via SUBCUTANEOUS

## 2012-03-26 NOTE — Progress Notes (Signed)
1630 Dr. Sharyn Lull inserted central venous line into internal jugular vein with  local anesthetic infiltrate under aseptic technique .  Tolerated the procedure well . opsite dressing applied . Chest xray to follow . breathsounds audible . No respiratory distress noted

## 2012-03-26 NOTE — Progress Notes (Signed)
Patient had an IV to his right hand that came out this am. IV team was called and was informed. Was told that patient was refusing any more IV sticks. MD called to notify. Reported to oncoming shift for follow-up.

## 2012-03-26 NOTE — Progress Notes (Signed)
Subjective:  Patient denies any chest pain or shortness of breath states appetite has slightly improved. No IV access but agrees for peripheral access may need central line if no IV access.  Objective:  Vital Signs in the last 24 hours: Temp:  [97.2 F (36.2 C)-98.2 F (36.8 C)] 98.2 F (36.8 C) (10/19 0600) Pulse Rate:  [74-84] 74  (10/19 0600) Resp:  [19-22] 20  (10/19 0600) BP: (139-170)/(59-91) 164/91 mmHg (10/19 0600) SpO2:  [99 %-100 %] 100 % (10/19 0600) Weight:  [104.4 kg (230 lb 2.6 oz)] 104.4 kg (230 lb 2.6 oz) (10/19 0600)  Intake/Output from previous day: 10/18 0701 - 10/19 0700 In: 1065 [P.O.:270; I.V.:795] Out: 651 [Urine:650; Stool:1] Intake/Output from this shift: Total I/O In: 240 [P.O.:240] Out: -   Physical Exam: Neck: no adenopathy, no carotid bruit, no JVD and supple, symmetrical, trachea midline Lungs: Decreased breath sound at bases Heart: regular rate and rhythm, S1, S2 normal and Soft systolic murmur noted  Abdomen: soft, non-tender; bowel sounds normal; no masses,  no organomegaly Extremities: No clubbing cyanosis 3+ edema noted Diffuse exfoliative rash noted decubitus ulcers noted  Lab Results:  Basename 03/26/12 0600 03/24/12 0425  WBC 8.4 9.7  HGB 10.9* 10.3*  PLT 104* 125*    Basename 03/26/12 0600 03/25/12 0630  NA 151* 151*  K 5.2* 5.7*  CL 120* 120*  CO2 20 19  GLUCOSE 487* 352*  BUN 96* 100*  CREATININE 4.46* 4.88*   No results found for this basename: TROPONINI:2,CK,MB:2 in the last 72 hours Hepatic Function Panel  Basename 03/26/12 0600  PROT --  ALBUMIN 1.9*  AST --  ALT --  ALKPHOS --  BILITOT --  BILIDIR --  IBILI --   No results found for this basename: CHOL in the last 72 hours No results found for this basename: PROTIME in the last 72 hours  Imaging: Imaging results have been reviewed and No results found.  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

## 2012-03-26 NOTE — Progress Notes (Signed)
Went to assess pt for PIV by ultrasound.  Pt's arms are very swollen and full of scales.  He is being treated with a lotion for his skin condition that does not allow tape to stick.  Don't have a way to secure a PIV even if one obtained.  Pt. Is refusing to let me do anything to him.   "I don't want to be messed with anymore"   He  will be on a bicarb drip and Vanco.  Recommended a central line.  Primary RN, Alexia Freestone made aware.   Also spoke with pt's wife at bedside of pros and cons of PIV vs a central line.   PICC not an option due to renal status.

## 2012-03-26 NOTE — Progress Notes (Signed)
[facility] ROUNDING NOTE  SUBJECTIVE: Pulled his IV out again and wouldn't allow restart IV therapy tryiing again now He may need another IJ as has IV meds (or possibly a feeding tube) for free water  Skin is looking better per wife and I agree  OBJECTIVE: Temp:  [97.2 F (36.2 C)-98.2 F (36.8 C)] 98.2 F (36.8 C) (10/19 0600) Pulse Rate:  [74-84] 74  (10/19 0600) Resp:  [19-22] 20  (10/19 0600) BP: (139-170)/(59-91) 164/91 mmHg (10/19 0600) SpO2:  [99 %-100 %] 100 % (10/19 0600) Weight:  [104.4 kg (230 lb 2.6 oz)] 104.4 kg (230 lb 2.6 oz) (10/19 0600) BP 164/91  Pulse 74  Temp 98.2 F (36.8 C) (Oral)  Resp 20  Ht 5\' 11"  (1.803 m)  Wt 104.4 kg (230 lb 2.6 oz)  BMI 32.10 kg/m2  SpO2 100% I/O last 3 completed shifts: In: 3416.7 [P.O.:630; I.V.:2786.7] Out: 1251 [Urine:1250; Stool:1] Total I/O In: 240 [P.O.:240] Out: -   PHYSICAL EXAMINATION: Diffuse exfoliation but redness underneath definitely fading Lungs with some crackles bilat bases (not good resp effort) No pericardial rub 2+ edema LE's Urine dark cloudy   LABORATORY: Basic Metabolic Panel:  Basename 03/26/12 0600 03/25/12 0630  NA 151* 151*  K 5.2* 5.7*  CL 120* 120*  CO2 20 19  GLUCOSE 487* 352*  BUN 96* 100*  CREATININE 4.46* 4.88*  CALCIUM 7.1* 7.0*  MG -- --  PHOS 5.1* 6.0*   Liver Function Tests:  Basename 03/26/12 0600 03/25/12 0630  AST -- --  ALT -- --  ALKPHOS -- --  BILITOT -- --  PROT -- --  ALBUMIN 1.9* 1.7*   CBC:  Basename 03/26/12 0600 03/24/12 0425  WBC 8.4 9.7  NEUTROABS 5.9 6.7  HGB 10.9* 10.3*  HCT 34.4* 31.7*  MCV 81.5 80.3  PLT 104* 125*  Results for [name], [name] (MRN [id number]) as of 03/26/2012 11:12  Ref. Range 02/19/2012 14:38 03/09/2012 12:14 03/09/2012 17:54 03/23/2012 07:10 03/24/2012 04:25 03/26/2012 06:00  Eosinophils Relative Latest Range: 0-5 % 17 (H) 37 (H) 35 (H) 20 (H) 4 3   Results for [name], [name] (MRN [id number]) as of  03/26/2012 11:12  Ref. Range 03/25/2012 16:18  Vancomycin Rm No range found 18.9    MEDICATIONS AND INFUSIONS .  sodium bicarbonate infusion 1/4 NS 1000 mL 100 mL/hr at 03/25/12 2236   . antiseptic oral rinse  15 mL Mouth Rinse q12n4p  . aspirin EC  81 mg Oral Daily  . chlorhexidine  15 mL Mouth Rinse BID  . feeding supplement  237 mL Oral TID WC  . heparin  5,000 Units Subcutaneous Q8H  . insulin aspart  0-15 Units Subcutaneous TID WC  . insulin aspart  20 Units Subcutaneous Once  . insulin glargine  10 Units Subcutaneous QHS  . methylPREDNISolone (SOLU-MEDROL) injection  100 mg Intravenous Q24H  . pantoprazole  40 mg Oral Q0600  . sodium chloride  3 mL Intravenous Q12H  . sodium polystyrene  30 g Oral Once  . triamcinolone cream   Topical BID  . vancomycin  1,500 mg Intravenous Q48H  . DISCONTD: triamcinolone cream   Topical BID  . DISCONTD: triamcinolone cream   Topical BID   URINE PATHOLOGY - path reported no malignant cells - did not stain for eosinophils as ordered and are closed for the weekend   Assessment/Recommendations   [name] is a 66 yo AA male with a PMH of CKD stage 2, CHF, HTN, DM2, and hypercholesteremia.  Baseline Cr. Of 1.77 on 02/24/11. 2.2 on admission for uncontrolled DM and MSSA bacteremia with continued worsening, in the setting of an exfoliative erythroderma and significant peripheral eosinophilia.   1. Acute kidney injury on CRF Low FeNa suggested prerenal/low intravasc volume at time of initial presentation. Initially thought to be due to low BP and poor perfusion  Peripheral edema/evidence volume overload but intravascular volume status tough to assess (Renal function worsened with diuretics, continued to worsen with D/C of diuretics and administration of IVF)  With skin rash and eosinophilia concerned about allergic interstitial nephritis as possible contributor to kidney injury however do not favor renal biopsy at this point  Day 3 IV SoluMedrol  then to change to po prednisone if he will take po Vol stable (ECFV excess but 3rd spacing fluids d/t low albumin) Check CXR Creatinine  (and eosinophilia) trending down a little since steroids started Urine for eosinophils pending *lab did cytology instead of eosinophil stain and are closed for the weekend so cannot reorder   2. CHF- diast - normal EF 60%  3. HTN pt had been hypotensive on admission BP meds stopped.   May need to restart meds.  Continue to monitor closely   4. DM2 Needs more aggressive insulin management DM mgmt recs increase lantus to 15 - ordered Blood sugars uncontrolled   5. Hypoalbuminemia consider feeding tube; pt with poor nutrition and poor po intake   6. MSSA bacteremia TEE negative. On Vanc given possible cephalosporin reaction   7. N/V/D- initial presentation, resolved.   8. Hypernatremia Hypotonic fluids at 100 hour with an amp of sodium bicarb to each liter when has IV access - refusing further Would recommend asking IR to place central line (not a PICC) OR could place feeding tube and give nutrition and free water via the tube.  Needs both.  9. Diffuse exfoliative skin rash with eosinophilia continue steroids,  triamcinoline BID changed to Kenalog cream per primary service.  It needs to be applied to ENTIRE BODY  Skin looking better since start steroids and d/c ancef

## 2012-03-26 NOTE — Progress Notes (Signed)
1845 with moderate bleeding oozing from  cvp insertion.Digital and pressure dressing applied until ceased .Seen and evaluated by iv team staff, dressing reinforced 1930  Portable chest xray done  Report given to night RN

## 2012-03-26 NOTE — CV Procedure (Signed)
Procedure note  Insertion of triple-lumen catheter we are right internal jugular approach  Under sterile condition triple-lumen catheter was inserted via right internal jugular vein without difficulty. Patient tolerated procedure well. No complications Chest x-ray to follow

## 2012-03-26 NOTE — Progress Notes (Signed)
0981  Dr. Sharyn Lull called . Made him aware of no iv access and pt's refusal to have re-inserted . Lab works relayed..Made him aware of  BS by CBG this am . With orders  . CBG    Changed to every 4 hours  As ordered .

## 2012-03-27 LAB — GLUCOSE, CAPILLARY: Glucose-Capillary: 213 mg/dL — ABNORMAL HIGH (ref 70–99)

## 2012-03-27 LAB — RENAL FUNCTION PANEL
CO2: 25 mEq/L (ref 19–32)
Chloride: 124 mEq/L — ABNORMAL HIGH (ref 96–112)
GFR calc Af Amer: 18 mL/min — ABNORMAL LOW (ref >90)
Glucose, Bld: 254 mg/dL — ABNORMAL HIGH (ref 70–99)
Potassium: 4.4 mEq/L (ref 3.5–5.1)
Sodium: 155 mEq/L — ABNORMAL HIGH (ref 135–145)

## 2012-03-27 MED ORDER — DEXTROSE 5 % IV SOLN
INTRAVENOUS | Status: DC
  Administered 2012-03-27: 100 mL via INTRAVENOUS
  Administered 2012-03-28: 10:00:00 via INTRAVENOUS

## 2012-03-27 MED ORDER — INSULIN ASPART 100 UNIT/ML ~~LOC~~ SOLN
0.0000 [IU] | SUBCUTANEOUS | Status: DC
  Administered 2012-03-27 (×2): 5 [IU] via SUBCUTANEOUS
  Administered 2012-03-27: 15 [IU] via SUBCUTANEOUS
  Administered 2012-03-27: 11 [IU] via SUBCUTANEOUS
  Administered 2012-03-27: 5 [IU] via SUBCUTANEOUS
  Administered 2012-03-28 (×2): 15 [IU] via SUBCUTANEOUS
  Administered 2012-03-28: 11 [IU] via SUBCUTANEOUS
  Administered 2012-03-29: 15 [IU] via SUBCUTANEOUS
  Administered 2012-03-29: 5 [IU] via SUBCUTANEOUS
  Administered 2012-03-29: 8 [IU] via SUBCUTANEOUS
  Administered 2012-03-29 – 2012-03-30 (×3): 15 [IU] via SUBCUTANEOUS
  Administered 2012-03-30: 8 [IU] via SUBCUTANEOUS
  Administered 2012-03-30: 11 [IU] via SUBCUTANEOUS
  Administered 2012-03-30: 8 [IU] via SUBCUTANEOUS
  Administered 2012-03-30: 11 [IU] via SUBCUTANEOUS
  Administered 2012-03-31: 4 [IU] via SUBCUTANEOUS
  Administered 2012-03-31: 5 [IU] via SUBCUTANEOUS
  Administered 2012-03-31: 8 [IU] via SUBCUTANEOUS
  Administered 2012-03-31 (×3): 15 [IU] via SUBCUTANEOUS
  Administered 2012-04-01: 5 [IU] via SUBCUTANEOUS
  Administered 2012-04-01: 8 [IU] via SUBCUTANEOUS
  Administered 2012-04-01: 5 [IU] via SUBCUTANEOUS
  Administered 2012-04-01: 11 [IU] via SUBCUTANEOUS
  Administered 2012-04-01: 2 [IU] via SUBCUTANEOUS

## 2012-03-27 MED ORDER — PREDNISONE 50 MG PO TABS
80.0000 mg | ORAL_TABLET | Freq: Once | ORAL | Status: AC
  Administered 2012-03-27: 80 mg via ORAL
  Filled 2012-03-27 (×2): qty 1

## 2012-03-27 MED ORDER — INSULIN GLARGINE 100 UNIT/ML ~~LOC~~ SOLN
20.0000 [IU] | Freq: Every day | SUBCUTANEOUS | Status: DC
  Administered 2012-03-27: 20 [IU] via SUBCUTANEOUS

## 2012-03-27 MED ORDER — PREDNISONE 50 MG PO TABS
80.0000 mg | ORAL_TABLET | Freq: Every day | ORAL | Status: DC
  Administered 2012-03-28: 80 mg via ORAL
  Filled 2012-03-27 (×2): qty 1

## 2012-03-27 NOTE — Progress Notes (Signed)
Page IV team IEJ dress had come off. Covered w/ sterile guaze to cover until IV team could apply new dressing.

## 2012-03-27 NOTE — Progress Notes (Signed)
Subjective:  Patient denies any chest pain states feels better today. Renal function gradually improving.  Objective:  Vital Signs in the last 24 hours: Temp:  [97.4 F (36.3 C)-98.4 F (36.9 C)] 97.8 F (36.6 C) (10/20 0610) Pulse Rate:  [74-79] 74  (10/20 0610) Resp:  [18-19] 19  (10/20 0610) BP: (138-153)/(64-76) 151/76 mmHg (10/20 0610) SpO2:  [97 %-99 %] 97 % (10/20 0610)  Intake/Output from previous day: 10/19 0701 - 10/20 0700 In: 3651.7 [P.O.:480; I.V.:3171.7] Out: 100 [Urine:100] Intake/Output from this shift: Total I/O In: 120 [P.O.:120] Out: -   Physical Exam: Neck: no adenopathy, no carotid bruit, no JVD and supple, symmetrical, trachea midline Lungs: Decreased breath sound at bases Heart: regular rate and rhythm, S1, S2 normal and Soft systolic murmur noted Abdomen: soft, non-tender; bowel sounds normal; no masses,  no organomegaly Extremities: No clubbing cyanosis 3+ edema noted Decubitus ulcers noted  Lab Results:  Basename 03/26/12 0600  WBC 8.4  HGB 10.9*  PLT 104*    Basename 03/27/12 0650 03/26/12 0600  NA 155* 151*  K 4.4 5.2*  CL 124* 120*  CO2 25 20  GLUCOSE 254* 487*  BUN 87* 96*  CREATININE 3.68* 4.46*   No results found for this basename: TROPONINI:2,CK,MB:2 in the last 72 hours Hepatic Function Panel  Basename 03/27/12 0650  PROT --  ALBUMIN 1.7*  AST --  ALT --  ALKPHOS --  BILITOT --  BILIDIR --  IBILI --   No results found for this basename: CHOL in the last 72 hours No results found for this basename: PROTIME in the last 72 hours  Imaging: Imaging results have been reviewed and Dg Chest Port 1v Same Day  03/26/2012  *RADIOLOGY REPORT*  Clinical Data: Status post triple-lumen catheter  PORTABLE CHEST - 1 VIEW SAME DAY  Comparison: 03/09/2012  Findings: Lungs are clear. No pleural effusion or pneumothorax.  Cardiomediastinal silhouette is within normal limits.  Right IJ catheter with its tip in the upper right atrium, 1.5  cm below the cavoatrial junction.  IMPRESSION: Right IJ catheter with its tip in the upper right atrium, 1.5 cm below the cavoatrial junction.  No pneumothorax is seen.   Original Report Authenticated By: Charline Bills, M.D.     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  LOS: 18 days    [name] N 03/27/2012, 11:05 AM

## 2012-03-27 NOTE — Progress Notes (Signed)
Subjective:  Patient stated "I wish you all would just leave me alone", and stated I could not examine him (student nurse in room who witness) From what I could see his skin looked better Now has a right IJ line in place Completed 3 days of IV methylprednisolone 100 mg To start prednisone today Urine output records appear to be incomplete  Objective:    Vital signs in last 24 hours: Filed Vitals:   03/26/12 0600 03/26/12 1352 03/26/12 2215 03/27/12 0610  BP: 164/91 138/73 153/64 151/76  Pulse: 74 79 77 74  Temp: 98.2 F (36.8 C) 98.4 F (36.9 C) 97.4 F (36.3 C) 97.8 F (36.6 C)  TempSrc: Oral Oral Oral Oral  Resp: 20 19 18 19   Height:      Weight: 104.4 kg (230 lb 2.6 oz)     SpO2: 100% 99% 99% 97%     Physical Exam:  Blood pressure 151/76, pulse 74, temperature 97.8 F (36.6 C), temperature source Oral, resp. rate 19, height 5\' 11"  (1.803 m), weight 104.4 kg (230 lb 2.6 oz), SpO2 97.00%. Patient adamantly refused to allow me to examine him  Lab 03/27/12 0650 03/26/12 0600 03/25/12 0630 03/24/12 0425 03/23/12 0710 03/22/12 0829 03/21/12 0520  NA 155* 151* 151* 156* 153* 151* 155*  K 4.4 5.2* 5.7* 5.5* 4.8 4.5 4.1  CL 124* 120* 120* 124* 123* 120* 123*  CO2 25 20 19  16* 17* 18* 18*  GLUCOSE 254* 487* 352* 238* 84 114* 113*  BUN 87* 96* 100* 96* 91* 83* 82*  CREATININE 3.68* 4.46* 4.88* 5.13* 4.85* 4.42* 4.34*  ALB -- -- -- -- -- -- --  CALCIUM 6.8* 7.1* 7.0* 7.4* 7.8* 7.7* 8.0*  PHOS 3.1 5.1* 6.0* 7.6* 5.9* 5.1* 4.1     Lab 03/27/12 0650 03/26/12 0600 03/25/12 0630  AST -- -- --  ALT -- -- --  ALKPHOS -- -- --  BILITOT -- -- --  PROT -- -- --  ALBUMIN 1.7* 1.9* 1.7*   Lab 03/26/12 0600 03/24/12 0425 03/23/12 0710  WBC 8.4 9.7 9.8  NEUTROABS 5.9 6.7 5.1  HGB 10.9* 10.3* 11.6*  HCT 34.4* 31.7* 35.7*  MCV 81.5 80.3 80.0  PLT 104* 125* 153    Lab 03/27/12 1205 03/27/12 0814 03/27/12 0407 03/27/12 0010 03/26/12 2044  GLUCAP 225* 213* 327* 423* 396*    Results for RAFAY, DAHAN (MRN [id number]) as of 03/26/2012 11:12   Ref. Range  02/19/2012 14:38  03/09/2012 12:14  03/09/2012 17:54  03/23/2012 07:10  03/24/2012 04:25  03/26/2012 06:00   Eosinophils Relative  Latest Range: 0-5 %  17 (H)  37 (H)  35 (H)  20 (H)  4  3    URINE PATHOLOGY - path reported no malignant cells - did not stain for eosinophils as ordered and are closed for the weekend   Studies/Results: Dg Chest Port 1v Same Day  03/26/2012  *RADIOLOGY REPORT*  Clinical Data: Status post triple-lumen catheter  PORTABLE CHEST - 1 VIEW SAME DAY  Comparison: 03/09/2012  Findings: Lungs are clear. No pleural effusion or pneumothorax.  Cardiomediastinal silhouette is within normal limits.  Right IJ catheter with its tip in the upper right atrium, 1.5 cm below the cavoatrial junction.  IMPRESSION: Right IJ catheter with its tip in the upper right atrium, 1.5 cm below the cavoatrial junction.  No pneumothorax is seen.   Original Report Authenticated By: Charline Bills, M.D.       Medications and Infusions: .  sodium  bicarbonate infusion 1/4 NS 1000 mL 100 mL/hr at 03/26/12 2250   . antiseptic oral rinse  15 mL Mouth Rinse q12n4p  . aspirin EC  81 mg Oral Daily  . chlorhexidine  15 mL Mouth Rinse BID  . feeding supplement  237 mL Oral TID WC  . heparin  5,000 Units Subcutaneous Q8H  . insulin aspart  0-15 Units Subcutaneous Q4H  . insulin glargine  15 Units Subcutaneous QHS  . pantoprazole  40 mg Oral Q0600  . sodium chloride  3 mL Intravenous Q12H  . triamcinolone cream   Topical BID  . vancomycin  1,500 mg Intravenous Q48H  . DISCONTD: insulin aspart  0-15 Units Subcutaneous TID WC     I  have reviewed scheduled and prn medications.  ASSESSMENT/RECOMMENDATIONS  Mr. Rosati is a 66 yo AA male with a PMH of CKD stage 2, CHF, HTN, DM2, and hypercholesteremia. Baseline Cr. Of 1.77 on 02/24/11. 2.2 on admission for uncontrolled DM and MSSA bacteremia with continued worsening, in  the setting of an exfoliative erythroderma and significant peripheral eosinophilia.   1. Acute kidney injury on CRF - most likely related to acute interstitial nephritis  Low FeNa suggested prerenal/low intravasc volume at time of initial presentation.  Initially thought to be due to low BP and poor perfusion  Peripheral edema/evidence volume overload but low alb made intravascular volume status tough to assess Renal function worsened with diuretics, continued to worsen with D/C of diuretics and administration of IVF)   With skin rash and eosinophilia concerned about allergic interstitial nephritis as possible contributor to kidney injury  Solumedrol started with improvement in both skin and renal function Offending drug ?? Ancef (changed to vanco by ID) S/p Day 3 IV SoluMedrol - change to po prednisone 1 mg/kg/day (80 mg) Urine for eosinophils pending *lab did cytology instead of eosinophil stain and are closed for the weekend so cannot reorder   2. CHF- diast - normal EF 60%; CXR neg 10/19   3. HTN  pt had been hypotensive on admission  BP meds stopped.  May need to restart meds.  Continue to monitor closely   4. DM2  Needs more aggressive insulin management  Increase lantus to 20 - ordered  Blood sugars uncontrolled   5. Hypoalbuminemia consider feeding tube; pt with poor nutrition and poor po intake   6. MSSA bacteremia  TEE negative. On Vanc given possible cephalosporin reaction   7. Hypernatremia with free water deficit Change to plain D5W as bicarb IV no longer needed and he won't drink po fluids well  9. Diffuse exfoliative skin rash with eosinophilia - see discussion in #1 Prednisone plus topical steroids  Camille Bal, MD [facility] [phone] Pager 03/27/2012, 1:00 PM

## 2012-03-28 LAB — COMPREHENSIVE METABOLIC PANEL
ALT: 38 U/L (ref 0–53)
AST: 45 U/L — ABNORMAL HIGH (ref 0–37)
Albumin: 1.8 g/dL — ABNORMAL LOW (ref 3.5–5.2)
CO2: 21 mEq/L (ref 19–32)
Calcium: 6.7 mg/dL — ABNORMAL LOW (ref 8.4–10.5)
Chloride: 114 mEq/L — ABNORMAL HIGH (ref 96–112)
GFR calc non Af Amer: 22 mL/min — ABNORMAL LOW (ref >90)
Sodium: 144 mEq/L (ref 135–145)

## 2012-03-28 LAB — GLUCOSE, CAPILLARY
Glucose-Capillary: 313 mg/dL — ABNORMAL HIGH (ref 70–99)
Glucose-Capillary: 330 mg/dL — ABNORMAL HIGH (ref 70–99)
Glucose-Capillary: 363 mg/dL — ABNORMAL HIGH (ref 70–99)
Glucose-Capillary: 435 mg/dL — ABNORMAL HIGH (ref 70–99)
Glucose-Capillary: 473 mg/dL — ABNORMAL HIGH (ref 70–99)
Glucose-Capillary: 486 mg/dL — ABNORMAL HIGH (ref 70–99)
Glucose-Capillary: 517 mg/dL — ABNORMAL HIGH (ref 70–99)

## 2012-03-28 LAB — CBC
MCH: 24.9 pg — ABNORMAL LOW (ref 26.0–34.0)
Platelets: 85 10*3/uL — ABNORMAL LOW (ref 150–400)
RBC: 3.69 MIL/uL — ABNORMAL LOW (ref 4.22–5.81)
RDW: 18.8 % — ABNORMAL HIGH (ref 11.5–15.5)
WBC: 7.4 10*3/uL (ref 4.0–10.5)

## 2012-03-28 MED ORDER — INSULIN ASPART 100 UNIT/ML ~~LOC~~ SOLN
20.0000 [IU] | Freq: Once | SUBCUTANEOUS | Status: AC
  Administered 2012-03-28: 20 [IU] via SUBCUTANEOUS

## 2012-03-28 MED ORDER — PREDNISONE 20 MG PO TABS
40.0000 mg | ORAL_TABLET | Freq: Every day | ORAL | Status: DC
  Administered 2012-03-29: 40 mg via ORAL
  Filled 2012-03-28 (×2): qty 2

## 2012-03-28 MED ORDER — INSULIN GLARGINE 100 UNIT/ML ~~LOC~~ SOLN
20.0000 [IU] | Freq: Two times a day (BID) | SUBCUTANEOUS | Status: DC
  Administered 2012-03-28 – 2012-03-29 (×2): 20 [IU] via SUBCUTANEOUS

## 2012-03-28 NOTE — Progress Notes (Signed)
Pt's cbg is 496, pt asymptomatic  Dr. Sharyn Lull notified and ordered 20 U to be given once and prednisone 40 mg once daily with breakfast.  Will continue to monitor.

## 2012-03-28 NOTE — Progress Notes (Signed)
PT Cancellation Note  Patient Details Name: [name] MRN: [id number] DOB: Dec 12, 1945   Cancelled Treatment:   Attempted 2x's.  Pt refusing both attempts.   Will attempt back tomorrow.  If pt refuses tomorrow as well, PT services will sign off.  Pt's wife is aware of this.     Verdell Face, [place] [phone] 03/28/2012

## 2012-03-28 NOTE — Progress Notes (Signed)
Subjective:  Patient denies any chest pain or shortness of breath. Skin lesions are markedly improved. Blood sugar uncontrolled secondary to steroids  Objective:  Vital Signs in the last 24 hours: Temp:  [97.1 F (36.2 C)-97.5 F (36.4 C)] 97.1 F (36.2 C) (10/21 0358) Pulse Rate:  [77-81] 81  (10/21 0358) Resp:  [18] 18  (10/21 0358) BP: (135-148)/(62-71) 144/62 mmHg (10/21 0358) SpO2:  [98 %] 98 % (10/21 0358) Weight:  [104.1 kg (229 lb 8 oz)] 104.1 kg (229 lb 8 oz) (10/21 0358)  Intake/Output from previous day: 10/20 0701 - 10/21 0700 In: 720 [P.O.:720] Out: 175 [Urine:175] Intake/Output from this shift:    Physical Exam: Neck: no adenopathy, no carotid bruit, no JVD and supple, symmetrical, trachea midline Lungs: Decreased breath sound at bases Heart: regular rate and rhythm, S1, S2 normal and Soft systolic murmur noted Abdomen: soft, non-tender; bowel sounds normal; no masses,  no organomegaly Extremities: No clubbing cyanosis 2+ edema decubitus ulcers noted  Lab Results:  Basename 03/28/12 0500 03/26/12 0600  WBC 7.4 8.4  HGB 9.2* 10.9*  PLT 85* 104*    Basename 03/28/12 0500 03/27/12 0650  NA 144 155*  K 4.7 4.4  CL 114* 124*  CO2 21 25  GLUCOSE 375* 254*  BUN 73* 87*  CREATININE 2.83* 3.68*   No results found for this basename: TROPONINI:2,CK,MB:2 in the last 72 hours Hepatic Function Panel  Basename 03/28/12 0500  PROT 6.8  ALBUMIN 1.8*  AST 45*  ALT 38  ALKPHOS 265*  BILITOT 0.5  BILIDIR --  IBILI --   No results found for this basename: CHOL in the last 72 hours No results found for this basename: PROTIME in the last 72 hours  Imaging: Imaging results have been reviewed and Dg Chest Port 1v Same Day  03/26/2012  *RADIOLOGY REPORT*  Clinical Data: Status post triple-lumen catheter  PORTABLE CHEST - 1 VIEW SAME DAY  Comparison: 03/09/2012  Findings: Lungs are clear. No pleural effusion or pneumothorax.  Cardiomediastinal silhouette is within  normal limits.  Right IJ catheter with its tip in the upper right atrium, 1.5 cm below the cavoatrial junction.  IMPRESSION: Right IJ catheter with its tip in the upper right atrium, 1.5 cm below the cavoatrial junction.  No pneumothorax is seen.   Original Report Authenticated By: Charline Bills, M.D.     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

## 2012-03-28 NOTE — Progress Notes (Signed)
Inpatient Diabetes Program Recommendations  AACE/ADA: New Consensus Statement on Inpatient Glycemic Control (2013)  Target Ranges:  Prepandial:   less than 140 mg/dL      Peak postprandial:   less than 180 mg/dL (1-2 hours)      Critically ill patients:  140 - 180 mg/dL    Inpatient Diabetes Program Recommendations Insulin - Basal: Increase Lantus to 30 units  Correction (SSI): Change to RESISTANT scale during steroid therapy HgbA1C: =10.5 Thank you  Piedad Climes [facility] Inpatient Diabetes Coordinator [phone] 8a-4p [phone] after 4p

## 2012-03-28 NOTE — Progress Notes (Signed)
[name]  Subjective:  Awake, alert, says he feels better, wife at bedside On prednisone 80/d   Objective: Vital signs in last 24 hours: Blood pressure 144/62, pulse 81, temperature 97.1 F (36.2 C), temperature source Oral, resp. rate 18, height 5\' 11"  (1.803 m), weight 104.1 kg (229 lb 8 oz), SpO2 98.00%.    PHYSICAL EXAM General--as above Chest--clear Heart--no rub Abd--nontender Extr--trace edema, skin starting to scale  Lab Results:   Lab 03/28/12 0500 03/27/12 0650 03/26/12 0600  NA 144 155* 151*  K 4.7 4.4 5.2*  CL 114* 124* 120*  CO2 21 25 20   BUN 73* 87* 96*  CREATININE 2.83* 3.68* 4.46*  ALB -- -- --  GLUCOSE 375* -- --  CALCIUM 6.7* 6.8* 7.1*  PHOS 3.0 3.1 5.1*     Basename 03/28/12 0500 03/26/12 0600  WBC 7.4 8.4  HGB 9.2* 10.9*  HCT 30.2* 34.4*  PLT 85* 104*    I have reviewed the patient's current medications.    Assessment/Plan: 1. 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 CHF- diast - normal EF 60%  3. HTN --pt had been hypotensive on admission --BP meds stopped. May need to restart meds. Continue to monitor     closely  4. DM2  Needs more aggressive insulin management  DM mgmt recs increase lantus to 15 - ordered  Blood sugars uncontrolled.  Glu likely to increase with prednisone  5. Hypoalbuminemia consider feeding tube; pt with poor nutrition and poor po intake  6. MSSA bacteremia --TEE negative. On Vanc given possible cephalosporin reaction  7. N/V/D- initial presentation, resolved.  8. Hypernatremia  Hypotonic fluids at 100 hour with an amp of sodium bicarb to each liter  - refusing further  Would recommend asking IR to place central line (not a PICC) OR  could place feeding tube and give nutrition and free water via the tube. Needs both.  9. Diffuse exfoliative skin rash with eosinophilia  continue steroids,  triamcinoline BID changed to Kenalog cream per primary service.  It needs to be applied to ENTIRE BODY  Skin looking better since start steroids and d/c ancef    LOS: 19 days   [name] F 03/28/2012,1:45 PM   .labalb

## 2012-03-28 NOTE — Progress Notes (Addendum)
ANTIBIOTIC CONSULT NOTE - follow up Pharmacy Consult for vancomycin Indication: MSSA bacteremia   No Known Allergies   Labs:  Basename 03/28/12 0500 03/27/12 0650 03/26/12 0600  WBC 7.4 -- 8.4  HGB 9.2* -- 10.9*  PLT 85* -- 104*  LABCREA -- -- --  CREATININE 2.83* 3.68* 4.46*   Estimated Creatinine Clearance: 31.1 ml/min (by C-G formula based on Cr of 2.83).  Basename 03/25/12 1618  VANCOTROUGH --  Leodis Binet --  VANCORANDOM 18.9  GENTTROUGH --  GENTPEAK --  GENTRANDOM --  TOBRATROUGH --  TOBRAPEAK --  TOBRARND --  AMIKACINPEAK --  AMIKACINTROU --  AMIKACIN --     Assessment: 66 yo man on vancomycin for MSSA bacteremia 2nd  to rash potentially caused by ancef. 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  Per ID consult he will need 2 weeks of IV tx through 10/23.    Goal of Therapy:  Vancomycin trough level 15-20 mcg/ml  Plan:  Continue maintenance dose of  1500 mg IV q48 hours.  Will f/u renal function and clinical course. Vancomycin to be discontinued in 2 days  Progressive decline in platelet count - consider dc heparin, start scds?  Thank you. Okey Regal, PharmD [phone]  03/28/2012 10:00 AM

## 2012-03-28 NOTE — Progress Notes (Signed)
Patients CBG is 477. Patient asymptomatic. Dr. Sharyn Lull notified and ordered 20 U to be given once.  RN will continue to monitor.  Louretta Parma, RN

## 2012-03-29 LAB — CBC WITH DIFFERENTIAL/PLATELET
Eosinophils Absolute: 0.3 10*3/uL (ref 0.0–0.7)
Eosinophils Relative: 4 % (ref 0–5)
HCT: 28 % — ABNORMAL LOW (ref 39.0–52.0)
Hemoglobin: 8.6 g/dL — ABNORMAL LOW (ref 13.0–17.0)
Lymphocytes Relative: 19 % (ref 12–46)
Lymphs Abs: 1.5 10*3/uL (ref 0.7–4.0)
MCH: 24.9 pg — ABNORMAL LOW (ref 26.0–34.0)
MCV: 81.2 fL (ref 78.0–100.0)
Monocytes Relative: 8 % (ref 3–12)
Platelets: 105 10*3/uL — ABNORMAL LOW (ref 150–400)
RBC: 3.45 MIL/uL — ABNORMAL LOW (ref 4.22–5.81)
WBC: 7.7 10*3/uL (ref 4.0–10.5)

## 2012-03-29 LAB — BASIC METABOLIC PANEL
CO2: 20 mEq/L (ref 19–32)
Calcium: 6.5 mg/dL — ABNORMAL LOW (ref 8.4–10.5)
Potassium: 5 mEq/L (ref 3.5–5.1)
Sodium: 143 mEq/L (ref 135–145)

## 2012-03-29 LAB — RENAL FUNCTION PANEL
BUN: 71 mg/dL — ABNORMAL HIGH (ref 6–23)
CO2: 23 mEq/L (ref 19–32)
Chloride: 115 mEq/L — ABNORMAL HIGH (ref 96–112)
Creatinine, Ser: 2.68 mg/dL — ABNORMAL HIGH (ref 0.50–1.35)
Glucose, Bld: 272 mg/dL — ABNORMAL HIGH (ref 70–99)
Potassium: 4.4 mEq/L (ref 3.5–5.1)

## 2012-03-29 LAB — GLUCOSE, CAPILLARY
Glucose-Capillary: 247 mg/dL — ABNORMAL HIGH (ref 70–99)
Glucose-Capillary: 265 mg/dL — ABNORMAL HIGH (ref 70–99)
Glucose-Capillary: 382 mg/dL — ABNORMAL HIGH (ref 70–99)
Glucose-Capillary: 409 mg/dL — ABNORMAL HIGH (ref 70–99)
Glucose-Capillary: 477 mg/dL — ABNORMAL HIGH (ref 70–99)
Glucose-Capillary: 542 mg/dL — ABNORMAL HIGH (ref 70–99)
Glucose-Capillary: 547 mg/dL — ABNORMAL HIGH (ref 70–99)

## 2012-03-29 MED ORDER — INSULIN GLARGINE 100 UNIT/ML ~~LOC~~ SOLN: 25.0000 [IU] | Freq: Two times a day (BID) | SUBCUTANEOUS | Status: DC

## 2012-03-29 MED ORDER — PREDNISONE 50 MG PO TABS
60.0000 mg | ORAL_TABLET | Freq: Every day | ORAL | Status: DC
  Administered 2012-03-30 – 2012-04-01 (×3): 60 mg via ORAL
  Filled 2012-03-29 (×4): qty 1

## 2012-03-29 MED ORDER — INSULIN ASPART 100 UNIT/ML ~~LOC~~ SOLN
20.0000 [IU] | Freq: Once | SUBCUTANEOUS | Status: AC
  Administered 2012-03-29: 20 [IU] via SUBCUTANEOUS

## 2012-03-29 MED ORDER — SODIUM CHLORIDE 0.9 % IJ SOLN
10.0000 mL | Freq: Two times a day (BID) | INTRAMUSCULAR | Status: DC
  Administered 2012-03-30 – 2012-03-31 (×2): 10 mL

## 2012-03-29 MED ORDER — SODIUM CHLORIDE 0.9 % IJ SOLN
10.0000 mL | INTRAMUSCULAR | Status: DC | PRN
  Administered 2012-03-29 – 2012-03-31 (×7): 10 mL

## 2012-03-29 MED ORDER — INSULIN GLARGINE 100 UNIT/ML ~~LOC~~ SOLN
30.0000 [IU] | Freq: Two times a day (BID) | SUBCUTANEOUS | Status: DC
  Administered 2012-03-29 – 2012-03-30 (×2): 30 [IU] via SUBCUTANEOUS

## 2012-03-29 MED ORDER — ENSURE COMPLETE PO LIQD
237.0000 mL | Freq: Three times a day (TID) | ORAL | Status: DC
  Administered 2012-03-29 – 2012-03-30 (×5): 237 mL via ORAL

## 2012-03-29 MED ORDER — SODIUM CHLORIDE 0.45 % IV SOLN
INTRAVENOUS | Status: DC
  Administered 2012-03-29 – 2012-03-30 (×2): via INTRAVENOUS
  Administered 2012-03-31: 40 mL/h via INTRAVENOUS

## 2012-03-29 NOTE — Progress Notes (Signed)
Subjective:  Patient denies any chest pain or shortness of breath. States feels hungry. Blood sugars are better controlled today after increasing Lantus to 20 units twice daily subcutaneous. Prednisone reduced to 40 mg daily. Renal function gradually improving skin lesions scaling of markedly improved Objective:  Vital Signs in the last 24 hours: Temp:  [97.6 F (36.4 C)-98.7 F (37.1 C)] 97.6 F (36.4 C) (10/22 0818) Pulse Rate:  [62-66] 62  (10/22 0818) Resp:  [18-22] 20  (10/22 0818) BP: (142-168)/(53-65) 142/53 mmHg (10/22 0818) SpO2:  [98 %-100 %] 98 % (10/22 0818) Weight:  [107.8 kg (237 lb 10.5 oz)] 107.8 kg (237 lb 10.5 oz) (10/22 0434)  Intake/Output from previous day: 10/21 0701 - 10/22 0700 In: 960 [P.O.:960] Out: 626 [Urine:625; Stool:1] Intake/Output from this shift: Total I/O In: 240 [P.O.:240] Out: -   Physical Exam: Neck: no adenopathy, no carotid bruit, no JVD and supple, symmetrical, trachea midline Lungs: clear to auscultation bilaterally Heart: regular rate and rhythm, S1, S2 normal and Soft systolic murmur noted Abdomen: soft, non-tender; bowel sounds normal; no masses,  no organomegaly Extremities: No clubbing cyanosis 2+ edema noted decubitus ulcers noted  Lab Results:  Basename 03/29/12 0500 03/28/12 0500  WBC 7.7 7.4  HGB 8.6* 9.2*  PLT 105* 85*    Basename 03/29/12 0500 03/28/12 0500  NA 145 144  K 4.4 4.7  CL 115* 114*  CO2 23 21  GLUCOSE 272* 375*  BUN 71* 73*  CREATININE 2.68* 2.83*   No results found for this basename: TROPONINI:2,CK,MB:2 in the last 72 hours Hepatic Function Panel  Basename 03/29/12 0500 03/28/12 0500  PROT -- 6.8  ALBUMIN 1.8* --  AST -- 45*  ALT -- 38  ALKPHOS -- 265*  BILITOT -- 0.5  BILIDIR -- --  IBILI -- --   No results found for this basename: CHOL in the last 72 hours No results found for this basename: PROTIME in the last 72 hours  Imaging: Imaging results have been reviewed and No results  found.  Cardiac Studies:  Assessment/Plan:  Staph aureus Methylin sensitive bacteremia probably from skin /catheter related sepsis  Resolving Acute on chronic renal insufficiency stage IV multifactorial rule out acute interstitial nephritis /nephrotic syndrome  Status post hypotension secondary to medication  dehydration  Diabetes mellitus uncontrolled  Hypertension  Nonischemic cardiomyopathy  Compensated systolic/diastolic heart failure heart failure  Morbid obesity  Hypoalbuminemia  Exfoliative dermatitis improved  Plan Continue present management Ensure supplement twice daily Increase ambulation Check labs in a.m.  LOS: 20 days    Daysean Tinkham N 03/29/2012, 9:02 AM

## 2012-03-29 NOTE — Progress Notes (Signed)
Occupational Therapy Treatment Patient Details Name: [name] MRN: [id number] DOB: 12/09/1945 Today's Date: 03/29/2012 Time: 7829-5621 OT Time Calculation (min): 40 min  OT Assessment / Plan / Recommendation Comments on Treatment Session Pt requires max encouragement to participate with self limiting behaviors, which are inappropriate at times. discussed use of Stedy with nursing to increase safety and success with transfers. Will follow.    Follow Up Recommendations  Skilled nursing facility    Barriers to Discharge       Equipment Recommendations  3 in 1 bedside comode;Hospital bed    Recommendations for Other Services    Frequency Min 2X/week   Plan Discharge plan remains appropriate    Precautions / Restrictions Precautions Precautions: Fall Precaution Comments: multiple areas of skin breakdown   Pertinent Vitals/Pain Vitals WNL    ADL  Grooming: Performed;Set up;Other (comment) (with max encouragement) Lower Body Bathing: Performed;Maximal assistance;Other (comment) (with max encouragement) Toileting - Clothing Manipulation and Hygiene: Performed;+1 Total assistance (unaware of BM incontinence) Transfers/Ambulation Related to ADLs: used stedy for transfers with minguard ADL Comments: Pt with mobility but is self limiting. Increased time    OT Diagnosis:    OT Problem List:   OT Treatment Interventions:     OT Goals Acute Rehab OT Goals OT Goal Formulation: With patient Time For Goal Achievement: 04/11/12 Potential to Achieve Goals: Good ADL Goals Pt Will Perform Grooming: with supervision;Standing at sink;Unsupported;with cueing (comment type and amount) ADL Goal: Grooming - Progress: Progressing toward goals Pt Will Perform Upper Body Bathing: Standing at sink;with supervision;Unsupported;with cueing (comment type and amount) ADL Goal: Upper Body Bathing - Progress: Progressing toward goals Pt Will Perform Lower Body Bathing: with min assist;Sit to stand  from chair;Unsupported;with adaptive equipment;with cueing (comment type and amount) ADL Goal: Lower Body Bathing - Progress: Not progressing Pt Will Perform Lower Body Dressing: with min assist;Unsupported;with adaptive equipment;with cueing (comment type and amount);Sit to stand from chair ADL Goal: Lower Body Dressing - Progress: Not progressing Pt Will Transfer to Toilet: with supervision;3-in-1;Ambulation;with cueing (comment type and amount);with DME ADL Goal: Toilet Transfer - Progress: Progressing toward goals Pt Will Perform Toileting - Clothing Manipulation: with supervision;Standing;with cueing (comment type and amount) ADL Goal: Toileting - Clothing Manipulation - Progress: Not progressing Pt Will Perform Toileting - Hygiene: with supervision;Sit to stand from 3-in-1/toilet;Standing at 3-in-1/toilet ADL Goal: Toileting - Hygiene - Progress: Not progressing Pt Will Perform Tub/Shower Transfer: Shower transfer;with caregiver independent in assisting;with min assist;with DME;Ambulation;Shower seat with back ADL Goal: Web designer - Progress: Discontinued (comment) Arm Goals Pt Will Complete Theraband Exer: with supervision, verbal cues required/provided;to increase strength;Bilateral upper extremities;2 sets;Level 2 Theraband Arm Goal: Theraband Exercises - Progress: Progressing toward goal  Visit Information  Last OT Received On: 03/29/12 Assistance Needed: +2 PT/OT Co-Evaluation/Treatment: Yes    Subjective Data      Prior Functioning       Cognition  Overall Cognitive Status: Impaired Area of Impairment: Attention;Memory;Following commands;Safety/judgement;Awareness of errors;Awareness of deficits;Problem solving;Executive functioning Arousal/Alertness: Awake/alert Orientation Level: Appears intact for tasks assessed Behavior During Session: Agitated Current Attention Level: Sustained Attention - Other Comments: internally distracted Memory: Decreased recall of  precautions Following Commands: Follows one step commands consistently Safety/Judgement: Decreased awareness of safety precautions;Decreased safety judgement for tasks assessed;Decreased awareness of need for assistance Safety/Judgement - Other Comments: attempts to get out of bed without equipment being ready regardless of multiple vc to not get up Awareness of Errors: Assistance required to identify errors made;Assistance required to correct errors made  Awareness of Errors - Other Comments: poor anticipatory awareness Problem Solving: cues for functional basic Executive Functioning: decreased reasoning, decreased self regulatory behavior Cognition - Other Comments: Pt beligerant at imes. verbally inappropriate.    Mobility  Shoulder Instructions Bed Mobility Bed Mobility: Supine to Sit;Sitting - Scoot to Edge of Bed Supine to Sit: 4: Min assist;HOB elevated;With rails Sitting - Scoot to Edge of Bed: 5: Supervision Details for Bed Mobility Assistance: pt with better performance when he is given increased time to complete on his own due to skin breakdown on buttocks Transfers Transfers: Sit to Stand;Stand to Sit Sit to Stand: With upper extremity assist;4: Min guard;Other (comment) (with use of stedy) Stand to Sit: 3: Mod assist;To chair/3-in-1 (needed controlled descent)                 End of Session OT - End of Session Equipment Utilized During Treatment: Other (comment) (stedy) Activity Tolerance: Other (comment) (self limiting) Patient left: in chair;with call bell/phone within reach;with family/visitor present Nurse Communication: Need for lift equipment;Other (comment) (stedy)  GO     Jadier Rockers,HILLARY 03/29/2012, 1:43 PM [facility], OTR/L  [phone] 03/29/2012

## 2012-03-29 NOTE — Progress Notes (Signed)
Pt's CBG=409. Notified Dr Sharyn Lull. Order received to give 15 units of Novolog insulin SQ. Will follow orders and continue to monitor pt closely.  Landry Dyke

## 2012-03-29 NOTE — Progress Notes (Signed)
[facility]  Subjective:  Awake, alert, hungry (on prednisone).  Wife at bedside   Objective: Vital signs in last 24 hours: Blood pressure 142/53, pulse 62, temperature 97.6 F (36.4 C), temperature source Oral, resp. rate 20, height 5\' 11"  (1.803 m), weight 107.8 kg (237 lb 10.5 oz), SpO2 98.00%.    PHYSICAL EXAM General--as above Chest--clear Heart--no rub Abd--nontender Extr--1-2+ edema, scaly skin.  Feet have cracks on botom--wife asked me to call podiatrist at [facility] (I did)  Lab Results:   Lab 03/29/12 0500 03/28/12 0500 03/27/12 0650  NA 145 144 155*  K 4.4 4.7 4.4  CL 115* 114* 124*  CO2 23 21 25   BUN 71* 73* 87*  CREATININE 2.68* 2.83* 3.68*  ALB -- -- --  GLUCOSE 272* -- --  CALCIUM 6.6* 6.7* 6.8*  PHOS 3.0 3.0 3.1     Basename 03/29/12 0500 03/28/12 0500  WBC 7.7 7.4  HGB 8.6* 9.2*  HCT 28.0* 30.2*  PLT 105* 85*   I have reviewed the patient's current medications.   Assessment/Plan:  1. 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   2. CHF- diast - normal EF 60%  3. HTN --pt had been hypotensive on admission --BP meds stopped. May need to restart meds. BP today 143/53 Continue to monitor closely  4. DM2 --Needs more aggressive insulin management  Glu likely to increase with prednisone  5. Hypoalbuminemia consider feeding tube; pt with poor nutrition and poor po intake  6. MSSA bacteremia --TEE negative. On Vanc given possible cephalosporin reaction  7. N/V/D- initial presentation, resolved.  8. Hypernatremia --Hypotonic fluids  (1/2 NS 40 cc/hr)--will need furosemide, too for edema--watch  [Na] 9. Diffuse exfoliative skin rash with eosinophilia  continue steroids,  triamcinoline BID changed to Kenalog cream per primary service.  It needs to be applied to ENTIRE BODY  Skin looking better since start steroids and d/c ancef   LOS: 20 days   Niccole Witthuhn F 03/29/2012,11:34 AM   .labalb

## 2012-03-29 NOTE — Progress Notes (Addendum)
Physical Therapy Treatment Patient Details Name: [name] MRN: [id number] DOB: [birth year] Today's Date: 03/29/2012 Time: 4098-1191 PT Time Calculation (min): 33 min  PT Assessment / Plan / Recommendation Comments on Treatment Session  Pt. admit with dehydration and renal insufficiency.  Pt. difficult to motivate.  Wife asked to be present today and she did provide encouragement and pt agreed reluctantly to get up.  Feel motivating factor was that he got his food tray and he agreed to  get up to eat that.  Stedy workded well for patient.     Follow Up Recommendations  Post acute inpatient     Does the patient have the potential to tolerate intense rehabilitation  No, Recommend SNF  Barriers to Discharge        Equipment Recommendations  3 in 1 bedside comode;Hospital bed    Recommendations for Other Services    Frequency Min 3X/week   Plan Discharge plan remains appropriate;Frequency remains appropriate    Precautions / Restrictions Precautions Precautions: Fall Precaution Comments: multiple areas of skin breakdown Restrictions Weight Bearing Restrictions: No   Pertinent Vitals/Pain VSS, No pain    Mobility  Bed Mobility Bed Mobility: Supine to Sit;Sitting - Scoot to Edge of Bed Supine to Sit: 4: Min assist;HOB elevated;With rails Sitting - Scoot to Edge of Bed: 5: Supervision Sit to Supine: 4: Min assist (A to lift BLE) Details for Bed Mobility Assistance: Pt. needed mepiplex bandages and tegaderm on his wounds which nursing came in and assisted with on right LE, buttocks, and heels.  Cleaned patient as well as his bed was soaked on arrival.  Does better when he can complete transitions on his own.   Transfers Transfers: Sit to Stand;Stand to Sit Sit to Stand: 4: Min guard;With upper extremity assist;From bed (with Stedy) Stand to Sit: With upper extremity assist;3: Mod assist;With armrests;To chair/3-in-1 Transfer via Lift Equipment: Stedy Details for Transfer  Assistance: cues for hand placement and technique.  Pt. attempting to get up prior to receiving instruction in how to use equipment and prior to it being in place.  Assist to achieve standing, balance, and controllled descent.   Ambulation/Gait Ambulation/Gait Assistance: Not tested (comment) Stairs: No Wheelchair Mobility Wheelchair Mobility: No     PT Goals Acute Rehab PT Goals PT Goal: Supine/Side to Sit - Progress: Progressing toward goal PT Goal: Sit to Stand - Progress: Progressing toward goal PT Goal: Stand to Sit - Progress: Progressing toward goal  Visit Information  Last PT Received On: 03/29/12 Assistance Needed: +2 PT/OT Co-Evaluation/Treatment: Yes    Subjective Data  Subjective: "I don't like you people coming in here telling me what to do."   Cognition  Overall Cognitive Status: Impaired Area of Impairment: Attention;Memory;Following commands;Safety/judgement;Awareness of errors;Awareness of deficits;Problem solving Arousal/Alertness: Awake/alert Orientation Level: Appears intact for tasks assessed Behavior During Session: Agitated Current Attention Level: Sustained Attention - Other Comments: internally distracted Memory: Decreased recall of precautions Following Commands: Follows one step commands consistently Safety/Judgement: Decreased awareness of safety precautions;Decreased safety judgement for tasks assessed;Decreased awareness of need for assistance Safety/Judgement - Other Comments: Pt. attempting to get out of bed without equipment being ready and in place regardless of multiple vc to not get up.   Awareness of Errors: Assistance required to identify errors made;Assistance required to correct errors made Awareness of Errors - Other Comments: poor anticipatory awareness Problem Solving: cues for functional basic Executive Functioning: decreased reasoning and decreased self regulatory behavior Cognition - Other Comments: Pt. beligerant at times.  End of Session PT - End of Session Equipment Utilized During Treatment: Gait belt Activity Tolerance: Patient tolerated treatment well Patient left: in chair;with call bell/phone within reach;with family/visitor present Nurse Communication: Mobility status;Need for lift equipment       INGOLD,Scotlynn Noyes 03/29/2012, 2:43 PM [facility] [phone] (pager)

## 2012-03-30 LAB — GLUCOSE, CAPILLARY
Glucose-Capillary: 254 mg/dL — ABNORMAL HIGH (ref 70–99)
Glucose-Capillary: 406 mg/dL — ABNORMAL HIGH (ref 70–99)

## 2012-03-30 LAB — RENAL FUNCTION PANEL
Albumin: 1.9 g/dL — ABNORMAL LOW (ref 3.5–5.2)
BUN: 65 mg/dL — ABNORMAL HIGH (ref 6–23)
Calcium: 6.9 mg/dL — ABNORMAL LOW (ref 8.4–10.5)
Chloride: 120 mEq/L — ABNORMAL HIGH (ref 96–112)
Creatinine, Ser: 2.39 mg/dL — ABNORMAL HIGH (ref 0.50–1.35)
Phosphorus: 2.2 mg/dL — ABNORMAL LOW (ref 2.3–4.6)

## 2012-03-30 LAB — IRON AND TIBC
Iron: 56 ug/dL (ref 42–135)
UIBC: 73 ug/dL — ABNORMAL LOW (ref 125–400)

## 2012-03-30 MED ORDER — INSULIN GLARGINE 100 UNIT/ML ~~LOC~~ SOLN
40.0000 [IU] | Freq: Two times a day (BID) | SUBCUTANEOUS | Status: DC
  Administered 2012-03-30 – 2012-04-01 (×4): 40 [IU] via SUBCUTANEOUS

## 2012-03-30 MED ORDER — FUROSEMIDE 80 MG PO TABS
80.0000 mg | ORAL_TABLET | Freq: Every day | ORAL | Status: DC
  Filled 2012-03-30: qty 1

## 2012-03-30 MED ORDER — TAMSULOSIN HCL 0.4 MG PO CAPS
0.4000 mg | ORAL_CAPSULE | Freq: Every day | ORAL | Status: DC
  Administered 2012-03-30 – 2012-04-01 (×3): 0.4 mg via ORAL
  Filled 2012-03-30 (×3): qty 1

## 2012-03-30 MED ORDER — FUROSEMIDE 80 MG PO TABS
80.0000 mg | ORAL_TABLET | Freq: Every day | ORAL | Status: DC
  Administered 2012-03-31 – 2012-04-01 (×2): 80 mg via ORAL
  Filled 2012-03-30 (×2): qty 1

## 2012-03-30 MED ORDER — FUROSEMIDE 10 MG/ML IJ SOLN
40.0000 mg | Freq: Once | INTRAMUSCULAR | Status: AC
  Administered 2012-03-30: 40 mg via INTRAVENOUS
  Filled 2012-03-30 (×2): qty 4

## 2012-03-30 NOTE — Progress Notes (Signed)
Pt CBG 406. MD notified and new orders given. Will continue to monitor. Jobe Igo A RN

## 2012-03-30 NOTE — Progress Notes (Addendum)
Physical Therapy Treatment Patient Details Name: [name] MRN: [id number] DOB: 1946-04-05 Today's Date: 03/30/2012 Time: 7829-5621 PT Time Calculation (min): 25 min  PT Assessment / Plan / Recommendation Comments on Treatment Session  Pt agreeable to get up today with min encouragement but immediatly became aggravated with linen change & pericare at beginning of session.   pt is easily aggravated throughout entire session.   Feel pt is self-limiting.  Cont to recommend Nsing to use stedy for transfers at this time to ensure safety.  Due to pt's motivation level & slow progression, frequency being decreased to 2x's/week.  Discussed with Myking Sar, PT, & she agrees with change.      Follow Up Recommendations  Post acute inpatient     Does the patient have the potential to tolerate intense rehabilitation  No, Recommend SNF  Barriers to Discharge        Equipment Recommendations  3 in 1 bedside comode;Hospital bed    Recommendations for Other Services    Frequency Min 2X/week   Plan Discharge plan remains appropriate;Frequency needs to be updated    Precautions / Restrictions Precautions Precautions: Fall Restrictions Weight Bearing Restrictions: No   Pertinent Vitals/Pain Pt does not report any pain.      Mobility  Bed Mobility Bed Mobility: Rolling Right;Supine to Sit;Sitting - Scoot to Edge of Bed Rolling Right: 6: Modified independent (Device/Increase time);With rail Supine to Sit: 1: +2 Total assist;With rails;HOB elevated Supine to Sit: Patient Percentage: 40% Sitting - Scoot to Edge of Bed: 5: Supervision Details for Bed Mobility Assistance: Pt with BM & soiled linens upon arrival.  Pt refusing pericare & linen change initially but Nurse Tech able to get pt to agree.  Pt required increased (A) for supine>sit today but it may have been because pt angry at this point & just lack of motivation to participate.  Max encouragement to remain sitting EOB while equipment being put  in place for transfer. (pt trying to lay back down) Transfers Transfers: Sit to Stand;Stand to Sit;Stand Pivot Transfers Sit to Stand: 4: Min assist;With upper extremity assist;From bed Stand to Sit: 3: Mod assist;With upper extremity assist;To chair/3-in-1 Stand Pivot Transfers: Other (comment) (use of stedy) Transfer via Lift Equipment: Stedy Details for Transfer Assistance: cues for hand placement & foot placement on stedy.  Pt pulling himself to standing using grab bar on stedy despite cues to start from bed.  Pt performed sit<>stand 3x's while in stedy with Min Guard (A).  While standing, pt perfoming standing marching (without being cued to).   Ambulation/Gait Ambulation/Gait Assistance: Not tested (comment)      PT Goals Acute Rehab PT Goals Time For Goal Achievement: 03/19/12 Potential to Achieve Goals: Good Pt will go Supine/Side to Sit: with modified independence PT Goal: Supine/Side to Sit - Progress: Not met Pt will go Sit to Supine/Side: with modified independence Pt will go Sit to Stand: with modified independence PT Goal: Sit to Stand - Progress: Not met Pt will go Stand to Sit: with modified independence PT Goal: Stand to Sit - Progress: Not met Pt will Ambulate: >150 feet;with modified independence;with least restrictive assistive device  Visit Information  Last PT Received On: 03/30/12 Assistance Needed: +2    Subjective Data  Subjective: "Look lady, don't come in here telling me what to do" Patient Stated Goal: wants to go home   Cognition  Overall Cognitive Status: Impaired Area of Impairment: Attention;Memory;Following commands;Safety/judgement;Awareness of errors;Awareness of deficits;Problem solving Arousal/Alertness: Awake/alert Orientation Level: Appears intact  for tasks assessed Behavior During Session: Agitated Attention - Other Comments: internally distracted Memory: Decreased recall of precautions Following Commands: Follows multi-step commands  inconsistently;Follows one step commands with increased time Safety/Judgement: Decreased awareness of safety precautions;Decreased safety judgement for tasks assessed;Decreased awareness of need for assistance Safety/Judgement - Other Comments: Cues required to wait for equipment to be set up & in place before standing.   Awareness of Errors: Assistance required to identify errors made;Assistance required to correct errors made Awareness of Errors - Other Comments: poor anticipatory awareness Problem Solving: cues for functional basic Executive Functioning: decreased reasoning and decreased self regulatory behavior Cognition - Other Comments: Pt beligerant at times.   (RN, Nurse Tech, & rehab tech present for session)    Balance     End of Session PT - End of Session Equipment Utilized During Treatment: Gait belt Activity Tolerance: Patient tolerated treatment well (self-limiting.  ) Patient left: in chair;with call bell/phone within reach;with nursing in room Nurse Communication: Mobility status;Need for lift equipment     Verdell Face, [place] [phone] 03/30/2012

## 2012-03-30 NOTE — Progress Notes (Signed)
Thank you for consult on Mr. [name]. Chart reviewed and note that patient has required much encouragement to participate with therapy and has had limited participation. Note that patient and wife are agreeable to SNF.  SNF recommended by therapy team and we would concur.  Will defer rehab consult for now.

## 2012-03-30 NOTE — Progress Notes (Signed)
Subjective:  Patient denies any chest pain or shortness of breath States appetite has improved Objective:  Vital Signs in the last 24 hours: Temp:  [97.4 F (36.3 C)-98.7 F (37.1 C)] 98.3 F (36.8 C) (10/23 1104) Pulse Rate:  [78-104] 98  (10/23 1104) Resp:  [18-20] 20  (10/23 1104) BP: (143-167)/(69-93) 150/69 mmHg (10/23 1104) SpO2:  [98 %-100 %] 98 % (10/23 1104) Weight:  [111.4 kg (245 lb 9.5 oz)] 111.4 kg (245 lb 9.5 oz) (10/23 0459)  Intake/Output from previous day: 10/22 0701 - 10/23 0700 In: 3456.7 [P.O.:1320; I.V.:636.7; IV Piggyback:1500] Out: 800 [Urine:800] Intake/Output from this shift: Total I/O In: 240 [P.O.:240] Out: 150 [Urine:150]  Physical Exam: Neck: no adenopathy, no carotid bruit, no JVD and supple, symmetrical, trachea midline Lungs: clear to auscultation bilaterally Heart: regular rate and rhythm, S1, S2 normal and Soft systolic murmur noted Abdomen: soft, non-tender; bowel sounds normal; no masses,  no organomegaly Extremities: No clubbing cyanosis 2+ edema noted  Lab Results:  Basename 03/29/12 0500 03/28/12 0500  WBC 7.7 7.4  HGB 8.6* 9.2*  PLT 105* 85*    Basename 03/30/12 0440 03/29/12 2100  NA 151* 143  K 4.7 5.0  CL 120* 112  CO2 22 20  GLUCOSE 276* 591*  BUN 65* 69*  CREATININE 2.39* 2.54*   No results found for this basename: TROPONINI:2,CK,MB:2 in the last 72 hours Hepatic Function Panel  Basename 03/30/12 0440 03/28/12 0500  PROT -- 6.8  ALBUMIN 1.9* --  AST -- 45*  ALT -- 38  ALKPHOS -- 265*  BILITOT -- 0.5  BILIDIR -- --  IBILI -- --   No results found for this basename: CHOL in the last 72 hours No results found for this basename: PROTIME in the last 72 hours  Imaging: Imaging results have been reviewed and No results found.  Cardiac Studies:  Assessment/Plan:  Status post staph aureus methicillin sensitive bacteremia Resolving Acute on chronic renal insufficiency stage IV multifactorial rule out acute  interstitial nephritis /nephrotic syndrome  Status post hypotension secondary to medication  Volume overload Diabetes mellitus uncontrolled  Hypertension  Nonischemic cardiomyopathy  Compensated systolic/diastolic heart failure heart failure  Morbid obesity  Hypoalbuminemia  Exfoliative dermatitis improved  Plan We'll add low-dose IV Lasix We'll get inpatient rehabilitation consult Increase Lantus insulin per orders  LOS: 21 days    [name] N 03/30/2012, 1:01 PM

## 2012-03-30 NOTE — Progress Notes (Signed)
Pt CBG 547. MD notified and new order given. Will continue to monitor and assess. Jobe Igo A RN

## 2012-03-30 NOTE — Progress Notes (Addendum)
Patient and patient's wife would like to choose [facility] for Skilled Nursing Placement.  CSW will send updated clinicals and make the facility aware of the patient's choice. CSW will continue to follow.   Sabino Niemann, MSW, [employer] [phone]

## 2012-03-30 NOTE — Progress Notes (Signed)
[facility]  Subjective:  Awake, alert, eating lunch Wife wonders whether he can go to [facility] rehab (good idea in my opinion)   Objective: Vital signs in last 24 hours: Blood pressure 150/69, pulse 98, temperature 98.3 F (36.8 C), temperature source Oral, resp. rate 20, height 5\' 11"  (1.803 m), weight 111.4 kg (245 lb 9.5 oz), SpO2 98.00%.    PHYSICAL EXAM General--as above  Chest--clear  Heart--no rub  Abd--nontender  Extr--2+ edema, scaly skin. Feet have cracks on botom--podiatrist called yesterday  Lab Results:   Lab 03/30/12 0440 03/29/12 2100 03/29/12 0500 03/28/12 0500  NA 151* 143 145 --  K 4.7 5.0 4.4 --  CL 120* 112 115* --  CO2 22 20 23  --  BUN 65* 69* 71* --  CREATININE 2.39* 2.54* 2.68* --  ALB -- -- -- --  GLUCOSE 276* -- -- --  CALCIUM 6.9* 6.5* 6.6* --  PHOS 2.2* -- 3.0 3.0     Basename 03/29/12 0500 03/28/12 0500  WBC 7.7 7.4  HGB 8.6* 9.2*  HCT 28.0* 30.2*  PLT 105* 85*   I have reviewed the patient's current medications.  Assessment/Plan: 1. 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  Need to watch glucose closely.  2. CHF- diast - normal EF 60%  3. HTN --pt had been hypotensive on admission --BP meds stopped. May need to restart meds. BP today 150/69  Continue to monitor closely  4. DM2 --Needs more aggressive insulin management Glu likely to increase with prednisone  5. Hypoalbuminemia--consider feeding tube; pt with poor nutrition and poor po intake  6. MSSA bacteremia --TEE negative. On Vanc given possible  cephalosporin reaction  7. N/V/D- initial presentation, resolved.  8. Hypernatremia --Hypotonic fluids (1/2 NS 40 cc/hr)--will need furosemide, too for edema--watch [Na]  9. Hypernatremia--on 1/2 NS at 40/hr plus po lasix 10. Diffuse exfoliative skin rash with eosinophilia--see 1.     LOS: 21 days   Jahanna Raether F 03/30/2012,12:23 PM   .labalb

## 2012-03-30 NOTE — Progress Notes (Signed)
PHARMACIST - PHYSICIAN COMMUNICATION DR:   Sharyn Lull CONCERNING:  IV Vancomycin  RECOMMENDATION: Please consider discontinuing vancomycin.  DESCRIPTION: Original plan per ID was to complete 2 weeks of IV antibiotic therapy through October 23   Thank you. Okey Regal, PharmD

## 2012-03-30 NOTE — Progress Notes (Signed)
[name],PT Acute Rehabilitation [phone] (pager)  

## 2012-03-30 NOTE — Progress Notes (Signed)
I cosign for [name]'s assessment, med administration, notes, I/O, and care plan/education.  

## 2012-03-31 ENCOUNTER — Inpatient Hospital Stay: Payer: Medicare Other | Admitting: Internal Medicine

## 2012-03-31 LAB — GLUCOSE, CAPILLARY
Glucose-Capillary: 346 mg/dL — ABNORMAL HIGH (ref 70–99)
Glucose-Capillary: 510 mg/dL — ABNORMAL HIGH (ref 70–99)
Glucose-Capillary: 232 mg/dL — ABNORMAL HIGH (ref 70–99)
Glucose-Capillary: 357 mg/dL — ABNORMAL HIGH (ref 70–99)
Glucose-Capillary: 433 mg/dL — ABNORMAL HIGH (ref 70–99)

## 2012-03-31 LAB — RENAL FUNCTION PANEL
Albumin: 1.8 g/dL — ABNORMAL LOW (ref 3.5–5.2)
BUN: 61 mg/dL — ABNORMAL HIGH (ref 6–23)
CO2: 22 mEq/L (ref 19–32)
Chloride: 118 mEq/L — ABNORMAL HIGH (ref 96–112)
Creatinine, Ser: 2.11 mg/dL — ABNORMAL HIGH (ref 0.50–1.35)

## 2012-03-31 LAB — CBC
Hemoglobin: 8.3 g/dL — ABNORMAL LOW (ref 13.0–17.0)
MCH: 25.1 pg — ABNORMAL LOW (ref 26.0–34.0)
MCV: 81.6 fL (ref 78.0–100.0)
RBC: 3.31 MIL/uL — ABNORMAL LOW (ref 4.22–5.81)

## 2012-03-31 MED ORDER — FUROSEMIDE 10 MG/ML IJ SOLN
40.0000 mg | Freq: Once | INTRAMUSCULAR | Status: AC
  Administered 2012-03-31: 40 mg via INTRAVENOUS
  Filled 2012-03-31: qty 4

## 2012-03-31 NOTE — Progress Notes (Signed)
Inpatient Diabetes Program Recommendations  AACE/ADA: New Consensus Statement on Inpatient Glycemic Control (2013)  Target Ranges:  Prepandial:   less than 140 mg/dL      Peak postprandial:   less than 180 mg/dL (1-2 hours)      Critically ill patients:  140 - 180 mg/dL  Results for SAGAR, [name] (MRN [id number]) as of 03/31/2012 10:37  Ref. Range 03/30/2012 11:21 03/30/2012 16:45 03/30/2012 20:25 03/30/2012 22:12 03/31/2012 07:50  Glucose-Capillary Latest Range: 70-99 mg/dL 469 (H) 629 (H) 528 (H) 510 (H) 171 (H)    Inpatient Diabetes Program Recommendations Insulin - Basal: agree with current order Correction (SSI): . Insulin - Meal Coverage: Add Novolog 5 units with meals for post prandial hyperglycemia related to steroid therapy HgbA1C: =10.5  Note: Much improved fasting CBG=171 this morning.  Addition of scheduled Novolog with meals may help prevent postprandial hyperglycemia.   Thank you  Piedad Climes [facility] Inpatient Diabetes Coordinator [phone]

## 2012-03-31 NOTE — Progress Notes (Addendum)
Due to documented "belligerent and aggitated" behavior and lack of motivation by PT and OT, [facility] will be unable to accept patient at this time. CSW gently explained to the patient's wife that due to some of the patient's behavior towards staff and patient's lack of participation/motivation, [facility] would be unable to accept him at their facility. Patient's wife became very upset and stated, "you are just trying to kick him out on the street and let him die". CSW attempted to calm patient's wife and shared that [facility] will be able to accept the patient.   Patient's wife did not want to discuss anything further with CSW. CSW will attempt to meet with patient's wife again at some point to discuss d/c planning.  Sabino Niemann, MSW, [employer] [phone]

## 2012-03-31 NOTE — Progress Notes (Signed)
Nutrition Follow-up  Intervention:   1. D/c Ensure, does not need 6 supplements per day. Continue Glucerna TID.  2. RD will continue to follow    Assessment:   Pt states he is "hungry all the time" likely related to steroids.  Renal function improving a bit.    Diet Order:  Renal 80/90  Supplements: Ensure Complete TID And Glucerna Shake TID Meds: Scheduled Meds:   . antiseptic oral rinse  15 mL Mouth Rinse q12n4p  . aspirin EC  81 mg Oral Daily  . chlorhexidine  15 mL Mouth Rinse BID  . feeding supplement  237 mL Oral TID WC  . feeding supplement  237 mL Oral TID WC  . furosemide  40 mg Intravenous Once  . furosemide  80 mg Oral Daily  . heparin  5,000 Units Subcutaneous Q8H  . insulin aspart  0-15 Units Subcutaneous Q4H  . insulin glargine  40 Units Subcutaneous BID  . pantoprazole  40 mg Oral Q0600  . predniSONE  60 mg Oral Q breakfast  . sodium chloride  10-40 mL Intracatheter Q12H  . Tamsulosin HCl  0.4 mg Oral Daily  . triamcinolone cream   Topical BID  . vancomycin  1,500 mg Intravenous Q48H  . DISCONTD: furosemide  80 mg Oral Daily  . DISCONTD: insulin glargine  30 Units Subcutaneous BID   Continuous Infusions:   . sodium chloride 40 mL/hr at 03/30/12 1623   PRN Meds:.acetaminophen, diphenhydrAMINE, ondansetron (ZOFRAN) IV, ondansetron, sodium chloride  Labs:  CMP     Component Value Date/Time   NA 149* 03/31/2012 0450   K 4.3 03/31/2012 0450   CL 118* 03/31/2012 0450   CO2 22 03/31/2012 0450   GLUCOSE 275* 03/31/2012 0450   BUN 61* 03/31/2012 0450   CREATININE 2.11* 03/31/2012 0450   CALCIUM 7.0* 03/31/2012 0450   PROT 6.8 03/28/2012 0500   ALBUMIN 1.8* 03/31/2012 0450   AST 45* 03/28/2012 0500   ALT 38 03/28/2012 0500   ALKPHOS 265* 03/28/2012 0500   BILITOT 0.5 03/28/2012 0500   GFRNONAA 31* 03/31/2012 0450   GFRAA 36* 03/31/2012 0450   Sodium  Date/Time Value Range Status  03/31/2012  4:50 AM 149* 135 - 145 mEq/L Final  03/30/2012  4:40 AM  151* 135 - 145 mEq/L Final     DELTA CHECK NOTED  03/29/2012  9:00 PM 143  135 - 145 mEq/L Final    Potassium  Date/Time Value Range Status  03/31/2012  4:50 AM 4.3  3.5 - 5.1 mEq/L Final  03/30/2012  4:40 AM 4.7  3.5 - 5.1 mEq/L Final  03/29/2012  9:00 PM 5.0  3.5 - 5.1 mEq/L Final    Phosphorus  Date/Time Value Range Status  03/31/2012  4:50 AM 2.3  2.3 - 4.6 mg/dL Final  91/47/8295  6:21 AM 2.2* 2.3 - 4.6 mg/dL Final  30/86/5784  6:96 AM 3.0  2.3 - 4.6 mg/dL Final    Magnesium  Date/Time Value Range Status  03/09/2012  4:40 PM 2.6* 1.5 - 2.5 mg/dL Final  2/95/2841  3:24 AM 2.4  1.5 - 2.5 mg/dL Final  09/07/270  5:36 PM 2.1  1.5 - 2.5 mg/dL Final   CBG (last 3)   Basename 03/31/12 0750 03/30/12 2212 03/30/12 2025  GLUCAP 171* 510* 481*        Intake/Output Summary (Last 24 hours) at 03/31/12 0957 Last data filed at 03/31/12 0854  Gross per 24 hour  Intake   2140 ml  Output  701 ml  Net   1439 ml    Weight Status:  249 lbs, trending up   Re-estimated needs:  2300-2500 kcal, 68-80 gm protein   Nutrition Dx:  Inadequate oral intake- resolved   Goal: PO intake to meet >90% estimated nutrition needs, met   Monitor:  PO intake, weight, labs   Clarene Duke RD, LDN Pager [phone] After Hours pager [phone]

## 2012-03-31 NOTE — Progress Notes (Signed)
ANTIBIOTIC CONSULT NOTE - Follow up Pharmacy Consult for vancomycin Indication: MSSA bacteremia   Allergies  Allergen Reactions  . Ancef (Cefazolin)     rash     Labs:  Basename 03/31/12 0450 03/30/12 0440 03/29/12 2100 03/29/12 0500  WBC 5.9 -- -- 7.7  HGB 8.3* -- -- 8.6*  PLT 116* -- -- 105*  LABCREA -- -- -- --  CREATININE 2.11* 2.39* 2.54* --   Estimated Creatinine Clearance: 43.5 ml/min (by C-G formula based on Cr of 2.11). No results found for this basename: VANCOTROUGH:2,VANCOPEAK:2,VANCORANDOM:2,GENTTROUGH:2,GENTPEAK:2,GENTRANDOM:2,TOBRATROUGH:2,TOBRAPEAK:2,TOBRARND:2,AMIKACINPEAK:2,AMIKACINTROU:2,AMIKACIN:2, in the last 72 hours   Assessment: 66 yo man on vancomycin for MSSA bacteremia 2nd  to rash potentially caused by ancef. 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  Per ID consult he will need 2 weeks of IV tx through 10/23. Pt with improved renal function.  Goal of Therapy:  Vancomycin trough level 15-20 mcg/ml  Plan:  Continue Vancomycin 1500mg  IV q48h. Spoke to Dr. Sharyn Lull who would like to continue for one more dose today - plan for SNF tomorrow. No levels needed.  Christoper Fabian, PharmD, BCPS Clinical pharmacist, pager [phone] 03/31/2012 10:42 AM

## 2012-03-31 NOTE — Progress Notes (Signed)
Subjective:  Patient denies any chest pain or shortness of breath. Complains of difficulty voiding. Denies any dysuria no fever or chills. Started on Flomax. Renal function gradually improving. Patient was found not the good candidate for inpatient rehabilitation. Patient has been off her status with nursing facilities. Blood sugar this morning is improved hopefully will stay stable during the day.  Objective:  Vital Signs in the last 24 hours: Temp:  [97.2 F (36.2 C)-98.3 F (36.8 C)] 97.2 F (36.2 C) (10/24 0528) Pulse Rate:  [66-98] 66  (10/24 0528) Resp:  [18-20] 20  (10/24 0528) BP: (146-163)/(69-92) 156/69 mmHg (10/24 0528) SpO2:  [98 %-100 %] 100 % (10/24 0528) Weight:  [113.3 kg (249 lb 12.5 oz)] 113.3 kg (249 lb 12.5 oz) (10/24 0528)  Intake/Output from previous day: 10/23 0701 - 10/24 0700 In: 2140 [P.O.:1060; I.V.:1080] Out: 852 [Urine:850; Stool:2] Intake/Output from this shift: Total I/O In: 240 [P.O.:240] Out: -   Physical Exam: Neck: no adenopathy, no carotid bruit, no JVD and supple, symmetrical, trachea midline Lungs: clear to auscultation bilaterally Heart: regular rate and rhythm, S1, S2 normal and Soft systolic murmur noted Abdomen: soft, non-tender; bowel sounds normal; no masses,  no organomegaly Extremities: No clubbing cyanosis 2+ edema  Lab Results:  Basename 03/31/12 0450 03/29/12 0500  WBC 5.9 7.7  HGB 8.3* 8.6*  PLT 116* 105*    Basename 03/31/12 0450 03/30/12 0440  NA 149* 151*  K 4.3 4.7  CL 118* 120*  CO2 22 22  GLUCOSE 275* 276*  BUN 61* 65*  CREATININE 2.11* 2.39*   No results found for this basename: TROPONINI:2,CK,MB:2 in the last 72 hours Hepatic Function Panel  Basename 03/31/12 0450  PROT --  ALBUMIN 1.8*  AST --  ALT --  ALKPHOS --  BILITOT --  BILIDIR --  IBILI --   No results found for this basename: CHOL in the last 72 hours No results found for this basename: PROTIME in the last 72 hours  Imaging: Imaging  results have been reviewed and No results found.  Cardiac Studies:  Assessment/Plan:  Status post staph aureus methicillin sensitive bacteremia  Resolving Acute on chronic renal insufficiency stage IV multifactorial rule out acute interstitial nephritis /nephrotic syndrome  Status post hypotension secondary to medication  Volume overload  Diabetes mellitus uncontrolled  Hypertension  Nonischemic cardiomyopathy  Compensated systolic/diastolic heart failure heart failure  Morbid obesity  Hypoalbuminemia  Exfoliative dermatitis improved  Plan Continue present management Monitor CBGs if stable will DC to skilled nursing facility tomorrow if okay with renal service  LOS: 22 days    [name] N 03/31/2012, 10:13 AM

## 2012-03-31 NOTE — Progress Notes (Signed)
Patient has been very pleasant with nursing staff as well as wife and other family members today. Though wife has been very disappointed from previous issues, the wife has complimented greatly on nurse and nurse techs that have been caring for her husband. Patient has not complained of any pain nor has he complained of anything that not related to his care. Will continue to monitor as needed to end of shift.

## 2012-03-31 NOTE — Progress Notes (Signed)
1. AKI on CKD, improving 2 Hypernatremia 3 Edema  Slowly improving AKI and hypernatremia.  Cont. Current Tx.   Subjective: Interval History: has no complaint of dysuria.  Objective: Vital signs in last 24 hours: Temp:  [97.2 F (36.2 C)-98.3 F (36.8 C)] 98.3 F (36.8 C) (10/24 1400) Pulse Rate:  [66-95] 76  (10/24 1400) Resp:  [20] 20  (10/24 1400) BP: (132-163)/(69-92) 132/70 mmHg (10/24 1400) SpO2:  [98 %-100 %] 99 % (10/24 1400) Weight:  [113.3 kg (249 lb 12.5 oz)] 113.3 kg (249 lb 12.5 oz) (10/24 0528) Weight change: 1.9 kg (4 lb 3 oz)  Intake/Output from previous day: 10/23 0701 - 10/24 0700 In: 2140 [P.O.:1060; I.V.:1080] Out: 852 [Urine:850; Stool:2] Intake/Output this shift: Total I/O In: 480 [P.O.:480] Out: -   General appearance: alert, cooperative and appears stated age Lungs clear Cor RRR Abd Obese Extrem 2-3+ le edema with scaly feet and legs  Lab Results:  Basename 03/31/12 0450 03/29/12 0500  WBC 5.9 7.7  HGB 8.3* 8.6*  HCT 27.0* 28.0*  PLT 116* 105*   BMET:  Basename 03/31/12 0450 03/30/12 0440  NA 149* 151*  K 4.3 4.7  CL 118* 120*  CO2 22 22  GLUCOSE 275* 276*  BUN 61* 65*  CREATININE 2.11* 2.39*  CALCIUM 7.0* 6.9*   No results found for this basename: PTH:2 in the last 72 hours Iron Studies:  Basename 03/30/12 0440  IRON 56  TIBC 129*  TRANSFERRIN --  FERRITIN 723*   Studies/Results: No results found.  Scheduled:   . antiseptic oral rinse  15 mL Mouth Rinse q12n4p  . aspirin EC  81 mg Oral Daily  . chlorhexidine  15 mL Mouth Rinse BID  . feeding supplement  237 mL Oral TID WC  . furosemide  40 mg Intravenous Once  . furosemide  80 mg Oral Daily  . heparin  5,000 Units Subcutaneous Q8H  . insulin aspart  0-15 Units Subcutaneous Q4H  . insulin glargine  40 Units Subcutaneous BID  . pantoprazole  40 mg Oral Q0600  . predniSONE  60 mg Oral Q breakfast  . sodium chloride  10-40 mL Intracatheter Q12H  . Tamsulosin HCl   0.4 mg Oral Daily  . triamcinolone cream   Topical BID  . vancomycin  1,500 mg Intravenous Q48H  . DISCONTD: feeding supplement  237 mL Oral TID WC       LOS: 22 days   Ahja Martello C 03/31/2012,3:28 PM

## 2012-04-01 LAB — RENAL FUNCTION PANEL
CO2: 23 mEq/L (ref 19–32)
Calcium: 7.2 mg/dL — ABNORMAL LOW (ref 8.4–10.5)
GFR calc Af Amer: 39 mL/min — ABNORMAL LOW (ref >90)
GFR calc non Af Amer: 34 mL/min — ABNORMAL LOW (ref >90)
Glucose, Bld: 227 mg/dL — ABNORMAL HIGH (ref 70–99)
Phosphorus: 2.4 mg/dL (ref 2.3–4.6)
Potassium: 4.1 mEq/L (ref 3.5–5.1)
Sodium: 148 mEq/L — ABNORMAL HIGH (ref 135–145)

## 2012-04-01 LAB — GLUCOSE, CAPILLARY
Glucose-Capillary: 202 mg/dL — ABNORMAL HIGH (ref 70–99)
Glucose-Capillary: 218 mg/dL — ABNORMAL HIGH (ref 70–99)

## 2012-04-01 MED ORDER — GLUCERNA SHAKE PO LIQD: 237.0000 mL | Freq: Three times a day (TID) | ORAL | Status: DC

## 2012-04-01 MED ORDER — PREDNISONE 20 MG PO TABS: 60.0000 mg | ORAL_TABLET | Freq: Every day | ORAL | Status: AC

## 2012-04-01 MED ORDER — CARVEDILOL 25 MG PO TABS: 12.5000 mg | ORAL_TABLET | Freq: Two times a day (BID) | ORAL | Status: DC

## 2012-04-01 MED ORDER — TRIAMCINOLONE ACETONIDE 0.5 % EX CREA: TOPICAL_CREAM | Freq: Two times a day (BID) | CUTANEOUS | Status: DC

## 2012-04-01 MED ORDER — INSULIN GLARGINE 100 UNIT/ML ~~LOC~~ SOLN: 40.0000 [IU] | Freq: Two times a day (BID) | SUBCUTANEOUS | Status: DC

## 2012-04-01 MED ORDER — TAMSULOSIN HCL 0.4 MG PO CAPS: 0.4000 mg | ORAL_CAPSULE | Freq: Every day | ORAL | Status: AC

## 2012-04-01 MED ORDER — INSULIN ASPART 100 UNIT/ML ~~LOC~~ SOLN: 0.0000 [IU] | SUBCUTANEOUS | Status: DC

## 2012-04-01 MED ORDER — CIPROFLOXACIN HCL 250 MG PO TABS: 250.0000 mg | ORAL_TABLET | Freq: Two times a day (BID) | ORAL | Status: DC

## 2012-04-01 NOTE — Progress Notes (Signed)
[facility] has a bed available for patient and patient's wife plans to fill out paperwork today.   Sabino Niemann, MSW, [employer] [phone]

## 2012-04-01 NOTE — Progress Notes (Signed)
[facility]  Subjective:  Awake, wants to get out of hospital. Thinks he may go to [facility] IV 1/2 NS at 40/hr (for hypernatremia)   Objective: Vital signs in last 24 hours: Blood pressure 180/83, pulse 93, temperature 98 F (36.7 C), temperature source Oral, resp. rate 18, height 5\' 11"  (1.803 m), weight 114.2 kg (251 lb 12.3 oz), SpO2 100.00%.    PHYSICAL EXAM General--awake, alert Chest--clear, cath in R IJ Heart--no rub Abd--nontender  Extr--3+ pretibial  Edema, scaly skin better  Lab Results:   Lab 04/01/12 0505 03/31/12 0450 03/30/12 0440  NA 148* 149* 151*  K 4.1 4.3 4.7  CL 119* 118* 120*  CO2 23 22 22   BUN 55* 61* 65*  CREATININE 1.95* 2.11* 2.39*  ALB -- -- --  GLUCOSE 227* -- --  CALCIUM 7.2* 7.0* 6.9*  PHOS 2.4 2.3 2.2*     Basename 03/31/12 0450  WBC 5.9  HGB 8.3*  HCT 27.0*  PLT 116*   I have reviewed the patient's current medications.  Assessment/Plan: 1. 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  He'll need prednisone for ~ 2 weeks at high dose (steroids started on 17 Oct.  I've decreased dose to 60/day) then taper to zero over next six weeks (60 mg/d x 2 wk, then decrease by 10 mg/wk over next 6 wk. Need to watch glucose closely.  2. CHF- diast - normal EF 60% --still with lots of edema.  Will check urine protein/Cr ratio 3. HTN --pt had been hypotensive on admission --BP meds stopped. May need to restart meds. BP today 180/83 Continue to monitor closely  4. DM2 --Needs more aggressive insulin management Glu 227 today.  Likely to increase with prednisone.  Once steroid dose is  tapered, his insulin requirements will be less  5. Hypoalbuminemia--consider feeding tube; pt with poor nutrition and poor po intake  6. MSSA bacteremia --TEE negative.  Off  Vanc 7. N/V/D- initial presentation, resolved.  8. Hypernatremia -I've d/c'd IV  1/2 NS and d/c'd furosemide for now.  Watch Na off 1/2 NS and off furosemide 9.Diffuse exfoliative skin rash with eosinophilia--see 1      LOS: 23 days   [name] F 04/01/2012,11:20 AM   .labalb

## 2012-04-01 NOTE — Progress Notes (Signed)
Pt d/c to NH via ambulance.  Joshuah Minella, RN. 

## 2012-04-01 NOTE — Discharge Summary (Signed)
  Discharge summary dictated on 04/01/2012 dictation number is [phone]

## 2012-04-01 NOTE — Progress Notes (Addendum)
Patient's wife was very apologetic to CSW for events that occurred yesterday.  Patient and Patient's wife were both  agreeable to placement at [facility]. Clinical social worker assisted with patient discharge to skilled nursing facility [facility],.  CSW addressed all family questions and concerns. CSW copied chart and added all important documents. CSW also set up patient transportation with [employer]. Clinical Social Worker will sign off for now as social work intervention is no longer needed.   Sabino Niemann, MSW, [employer] [phone]

## 2012-04-01 NOTE — Discharge Summary (Signed)
[name], [name]             ACCOUNT NO.:  [id number]  MEDICAL RECORD NO.:  [id number]  LOCATION:  4731                         FACILITY:  [facility]  PHYSICIAN:  Eduardo Osier. Sharyn Lull, M.D. DATE OF BIRTH:  20-Aug-1945  DATE OF ADMISSION:  03/09/2012 DATE OF DISCHARGE:  04/01/2012                              DISCHARGE SUMMARY   DISPOSITION:  [facility].  ADMITTING DIAGNOSES: 1. Uncontrolled diabetes mellitus. 2. Dehydration. 3. Acute-on-chronic renal insufficiency. 4. Diarrhea, rule out antibiotic associated colitis. 5. Hypertension. 6. Hypercholesteremia. 7. Acute-on-chronic kidney disease stage IV. 8. Nonischemic cardiomyopathy. 9. Compensated systolic heart failure. 10.Morbid obesity.  FINAL DIAGNOSES: 1. Status post staph aureus methicillin-sensitive bacteremia. 2. Resolving acute-on-chronic renal insufficiency stage IV. 3. Interstitial nephritis resolving. 4. Status post hypotensive shock secondary to medications/sepsis. 5. Diabetes mellitus, uncontrolled secondary to large doses of     steroids for interstitial nephritis. 6. Hypertension. 7. Compensated systolic/diastolic heart failure. 8. Morbid obesity. 9. Hypoalbuminemia. 10.Exfoliative dermatitis, improved.  DISCHARGE MEDICATIONS:  Ciprofloxacin 250 mg twice daily for one week; Glucerna supplement one can three times daily as tolerated; sliding scale insulin every 4 hours; Lantus insulin 40 units subcu twice daily; prednisone 60 mg daily for one week, then 50 mg daily for one week, 40 mg daily for one week, 30 mg daily for one week, 20 mg daily for one week, and 10 mg daily for one week; Flomax 0.4 mg daily; Kenalog cream apply locally twice daily; carvedilol 12.5 mg twice daily; enteric- coated aspirin 81 mg daily; Lasix 80 mg twice daily; and potassium chloride 20 mEq twice daily.  The patient has been advised to stop taking Benadryl, Glucotrol, Biodyl, and Aldactone.  DIET:  Low salt,  low cholesterol 1800 calories, ADA diet. The patient will require close blood sugar and blood pressure monitoring.  Follow up with me in one week and Renal service in two weeks.  CONDITION ON DISCHARGE:  Stable.  BRIEF HISTORY AND HOSPITAL COURSE:  The patient is a 66 year old male with past medical history significant for multiple medical problems, i.e. hypertension, systolic heart failure in the past, non-insulin- dependent diabetes mellitus, nonischemic cardiomyopathy, chronic kidney disease stage II, peripheral vascular disease, morbid obesity, he came to the ER complaining of poor appetite, nausea, vomiting, diarrhea, and blood sugars staying above 500 despite increasing glipizide for last four days.  The patient denies any chest pain, shortness of breath, or abdominal pain.  Denies any fever or chills.  Denies any urinary complaints.  The patient was noted to be orthostatic with blood pressure in 80s.  Repeat blood pressure has been normal, but no orthostatic changes.  The patient was noted to be mildly dehydrated with elevated creatinine.  The patient denies any palpitation, lightheadedness, or syncope.  Denies PND, orthopnea, or leg swelling.  The patient was recently discharged from the hospital, treated for decompensated systolic heart failure approximately 2 weeks ago.  PAST MEDICAL HISTORY:  As above.  PHYSICAL EXAMINATION:  VITAL SIGNS:  His blood pressure was 161/87, pulse was 87.  He was afebrile. HEENT:  Conjunctivae was pink. NECK:  Supple.  No JVD.  No bruit. CARDIOVASCULAR:  S1 and S2 was normal.  There was soft  systolic murmur. No S3 or gallop. LUNGS:  Clear to auscultation without rhonchi or rales. ABDOMEN:  Soft.  Bowel sounds were present.  Nontender. EXTREMITIES:  No clubbing, cyanosis, or edema. SKIN:  He had diffuse exfoliative rash.  LABORATORY DATA:  Admission lab sodium was 155, potassium 4.1, BUN was 82, creatinine 4.34.  His blood sugar was 133.   Hemoglobin was 11.6, hematocrit 35.7, white count of 9.8.  Blood cultures grew Staph aureus which was methicillin sensitive.  Repeat blood cultures were negative. The patient had TEE which showed no evidence of vegetations.  Labs from today; sodium is 148, potassium 4.1, chloride 119, BUN 55, creatinine 1.95, his albumin is low at 2.0 which is trending up.  Last hemoglobin was 8.3, hematocrit 27, white count of 5.9 which has been stable, and platelet count has improved to 116,000.  BRIEF HOSPITAL COURSE:  The patient was admitted to telemetry unit.  The patient was slowly started on IV hydration with improvement in his symptoms.  The patient subsequently developed high-grade fever and was noted to have purulent drainage from the catheter site.  Pan cultures were obtained.  The patient initially was started on Rocephin which was switched to vancomycin and then subsequently to Ancef.  If the patient develops severe exfoliative rash, Ancef will be switched to IV vancomycin which he has received for more than 2 weeks.  Repeat blood cultures have been negative.  The patient also had TEE which showed no evidence of vegetations.  Nephrology consultation was obtained as the patient had progressive worsening of renal function.  The patient was noted to have significantly elevated eosinophil counts and was felt to have interstitial nephritis.  The patient was started on high dose steroids with improvement in his renal function towards baseline.  His skin lesions also has markedly improved.  OT and PT consultation was called.  The patient has been ambulating in hallway with assistance.  His blood sugar has been anywhere between 300 to 500.  His Lantus dose has been increased due to high steroid dose.  The patient will need close blood sugar followup with steroid tapers over six weeks. The patient will be discharged to [facility] and will be followed up in my office in 1  week.     Eduardo Osier. Sharyn Lull, M.D.     [facility]/MED[facility]  D:  04/01/2012  T:  04/01/2012  Job:  [id number]

## 2012-04-01 NOTE — Progress Notes (Addendum)
Pt voided about 150cc of concentrated yellow urine and states sometimes lower abdomen hurt and wanted to Dr. Caryn Section informed.  Dr. Caryn Section notified.  Made pt and wife aware.   Will continue to monitor.  Amanda Pea, Charity fundraiser.

## 2012-04-01 NOTE — Progress Notes (Signed)
Physical Therapy Treatment Patient Details Name: [name] MRN: [id number] DOB: Aug 28, 1945 Today's Date: 04/01/2012 Time: 6578-4696 PT Time Calculation (min): 30 min  PT Assessment / Plan / Recommendation Comments on Treatment Session  Pt much more pleasant & willing to participate in therapy session today.  Moved fairly well.  However, cont to recommend SNF to maximize functional recovery.  Pt's & pt's wife both agree.   At end of session discussed with pt & pt's wife Re: previous issues. (did not bill for this time)      Follow Up Recommendations  Post acute inpatient     Does the patient have the potential to tolerate intense rehabilitation  No, Recommend SNF  Barriers to Discharge        Equipment Recommendations  3 in 1 bedside comode;Hospital bed    Recommendations for Other Services    Frequency Min 2X/week   Plan Discharge plan remains appropriate;Frequency needs to be updated    Precautions / Restrictions Precautions Precautions: Fall Restrictions Weight Bearing Restrictions: No       Mobility  Bed Mobility Bed Mobility: Supine to Sit;Sitting - Scoot to Edge of Bed Supine to Sit: 6: Modified independent (Device/Increase time);HOB elevated;With rails Sitting - Scoot to Edge of Bed: 6: Modified independent (Device/Increase time) Details for Bed Mobility Assistance: No physical (A) needed today, only increased time.  HOB elevated & relied heavily on Lt rail.   Transfers Transfers: Sit to Stand;Stand to Sit Sit to Stand: 3: Mod assist;4: Min guard;With upper extremity assist;With armrests;From bed;From chair/3-in-1 Stand to Sit: 4: Min guard;With upper extremity assist;With armrests;To chair/3-in-1 Details for Transfer Assistance: cues for hand placement.  Increased (A) to achieve standing from recliner compared to bed.   Ambulation/Gait Ambulation/Gait Assistance: 4: Min assist;4: Min Government social research officer (Feet): 120 Feet Assistive device: Rolling  walker Ambulation/Gait Assistance Details: Mostly Min Guard (A) with ocassional Min (A) due to LE's appearing to "give out".   Gait Pattern: Step-through pattern;Decreased stride length (Decreased step height.  ) Gait velocity: decreased General Gait Details: "Im ok, I'm supporting all my weight with my arms".   Stairs: No Wheelchair Mobility Wheelchair Mobility: No     PT Goals Acute Rehab PT Goals Time For Goal Achievement: 03/19/12 Potential to Achieve Goals: Good Pt will go Supine/Side to Sit: with modified independence PT Goal: Supine/Side to Sit - Progress: Met Pt will go Sit to Supine/Side: with modified independence Pt will go Sit to Stand: with modified independence PT Goal: Sit to Stand - Progress: Progressing toward goal Pt will go Stand to Sit: with modified independence PT Goal: Stand to Sit - Progress: Progressing toward goal Pt will Ambulate: >150 feet;with modified independence;with least restrictive assistive device PT Goal: Ambulate - Progress: Progressing toward goal  Visit Information  Last PT Received On: 04/01/12 Assistance Needed: +2 (safety; pt's performance fluctuates on his motivation)    Subjective Data  Subjective: "I know I can do it" Patient Stated Goal: wants to go home             End of Session PT - End of Session Equipment Utilized During Treatment: Gait belt Activity Tolerance: Patient tolerated treatment well Patient left: in chair;with call bell/phone within reach;with family/visitor present Nurse Communication: Mobility status    Verdell Face, [place] [phone] 04/01/2012

## 2012-04-01 NOTE — Progress Notes (Signed)
Report called to Jonny Ruiz nurse at [facility] were pt is being d/c'd to.  Pt currently waiting for ambulance transportation.  Will continue to monitor.  Amanda Pea, Charity fundraiser.

## 2012-04-05 ENCOUNTER — Inpatient Hospital Stay: Payer: Medicare Other | Admitting: Infectious Disease

## 2012-05-25 ENCOUNTER — Emergency Department (HOSPITAL_COMMUNITY): Payer: Medicare Other

## 2012-05-25 ENCOUNTER — Encounter (HOSPITAL_COMMUNITY): Payer: Self-pay | Admitting: *Deleted

## 2012-05-25 ENCOUNTER — Inpatient Hospital Stay (HOSPITAL_COMMUNITY)
Admission: EM | Admit: 2012-05-25 | Discharge: 2012-05-27 | DRG: 690 | Disposition: A | Discharge status: Home Health | Payer: Medicare Other | Attending: Cardiology | Admitting: Cardiology

## 2012-05-25 DIAGNOSIS — R627 Adult failure to thrive: Secondary | ICD-10-CM | POA: Diagnosis present

## 2012-05-25 DIAGNOSIS — IMO0002 Reserved for concepts with insufficient information to code with codable children: Secondary | ICD-10-CM

## 2012-05-25 DIAGNOSIS — I5042 Chronic combined systolic (congestive) and diastolic (congestive) heart failure: Secondary | ICD-10-CM | POA: Diagnosis present

## 2012-05-25 DIAGNOSIS — I129 Hypertensive chronic kidney disease with stage 1 through stage 4 chronic kidney disease, or unspecified chronic kidney disease: Secondary | ICD-10-CM | POA: Diagnosis present

## 2012-05-25 DIAGNOSIS — N390 Urinary tract infection, site not specified: Principal | ICD-10-CM | POA: Diagnosis present

## 2012-05-25 DIAGNOSIS — N184 Chronic kidney disease, stage 4 (severe): Secondary | ICD-10-CM | POA: Diagnosis present

## 2012-05-25 DIAGNOSIS — I509 Heart failure, unspecified: Secondary | ICD-10-CM | POA: Diagnosis present

## 2012-05-25 LAB — URINALYSIS, ROUTINE W REFLEX MICROSCOPIC
Bilirubin Urine: NEGATIVE
Glucose, UA: NEGATIVE mg/dL
Ketones, ur: NEGATIVE mg/dL
Nitrite: NEGATIVE
Specific Gravity, Urine: 1.018 (ref 1.005–1.030)
pH: 5 (ref 5.0–8.0)

## 2012-05-25 LAB — CBC WITH DIFFERENTIAL/PLATELET
Basophils Absolute: 0.1 10*3/uL (ref 0.0–0.1)
Eosinophils Absolute: 0.5 10*3/uL (ref 0.0–0.7)
Hemoglobin: 9.6 g/dL — ABNORMAL LOW (ref 13.0–17.0)
Lymphocytes Relative: 14 % (ref 12–46)
MCH: 27.4 pg (ref 26.0–34.0)
MCHC: 31.3 g/dL (ref 30.0–36.0)
Monocytes Absolute: 0.4 10*3/uL (ref 0.1–1.0)
Neutrophils Relative %: 71 % (ref 43–77)
Platelets: 224 10*3/uL (ref 150–400)
RDW: 14.4 % (ref 11.5–15.5)

## 2012-05-25 LAB — COMPREHENSIVE METABOLIC PANEL
ALT: 35 U/L (ref 0–53)
Albumin: 2.2 g/dL — ABNORMAL LOW (ref 3.5–5.2)
Alkaline Phosphatase: 238 U/L — ABNORMAL HIGH (ref 39–117)
Calcium: 8.1 mg/dL — ABNORMAL LOW (ref 8.4–10.5)
GFR calc Af Amer: 37 mL/min — ABNORMAL LOW (ref >90)
Glucose, Bld: 259 mg/dL — ABNORMAL HIGH (ref 70–99)
Potassium: 3.8 mEq/L (ref 3.5–5.1)
Sodium: 142 mEq/L (ref 135–145)
Total Protein: 6.5 g/dL (ref 6.0–8.3)

## 2012-05-25 LAB — URINE MICROSCOPIC-ADD ON

## 2012-05-25 LAB — GLUCOSE, CAPILLARY: Glucose-Capillary: 263 mg/dL — ABNORMAL HIGH (ref 70–99)

## 2012-05-25 MED ORDER — CIPROFLOXACIN IN D5W 400 MG/200ML IV SOLN
400.0000 mg | Freq: Once | INTRAVENOUS | Status: AC
  Administered 2012-05-26: 400 mg via INTRAVENOUS
  Filled 2012-05-25: qty 200

## 2012-05-25 MED ORDER — FUROSEMIDE 10 MG/ML IJ SOLN
40.0000 mg | Freq: Once | INTRAMUSCULAR | Status: AC
  Administered 2012-05-26: 40 mg via INTRAVENOUS
  Filled 2012-05-25: qty 4

## 2012-05-25 NOTE — ED Notes (Signed)
CBG was 263. Reported to RN

## 2012-05-25 NOTE — ED Provider Notes (Signed)
History     CSN: [id number]  Arrival date & time 05/25/12  1655   First MD Initiated Contact with Patient 05/25/12 1837      Chief Complaint  Patient presents with  . Hyperglycemia  . Fever  . Fatigue    (Consider location/radiation/quality/duration/timing/severity/associated sxs/prior treatment) Patient is a 66 y.o. male presenting with fever. The history is provided by the patient.  Fever Primary symptoms of the febrile illness include fever and fatigue. Primary symptoms do not include cough, abdominal pain, nausea or vomiting.   patient was sent in by home health nurse. Poorly had fever of 99.8. Poorly has smelled of urine and has had some irregular glucose checks. Patient states that he just feels weak all over. He has been urinating less. No chest pain. No cough. No abdominal pain. He does have peripheral edema. He's had decreased appetite. He states she's felt bad for 3 months. He has been in the hospital and rehabilitation for almost 2 months up until 3 weeks ago.  Past Medical History  Diagnosis Date  . Diabetes mellitus   . Hypertension   . Hyperlipidemia   . CHF (congestive heart failure)   . Enlarged heart   . Pneumonia     hx of     Past Surgical History  Procedure Date  . Right leg fracture   . Skin graft   . Knee surgery   . Tee without cardioversion 03/17/2012    Procedure: TRANSESOPHAGEAL ECHOCARDIOGRAM (TEE);  Surgeon: Ricki Rodriguez, MD;  Location: [facility] ENDOSCOPY;  Service: Cardiovascular;  Laterality: N/A;    History reviewed. No pertinent family history.  History  Substance Use Topics  . Smoking status: Former Smoker    Quit date: 03/10/1984  . Smokeless tobacco: Never Used  . Alcohol Use: No      Review of Systems  Constitutional: Positive for fever and fatigue.  HENT: Negative for neck stiffness.   Respiratory: Negative for cough and choking.   Cardiovascular: Positive for leg swelling. Negative for chest pain.  Gastrointestinal:  Negative for nausea, vomiting and abdominal pain.  Genitourinary: Positive for decreased urine volume. Negative for penile swelling and genital sores.  Musculoskeletal: Negative for back pain.  Skin: Negative for color change and wound.  Neurological: Positive for weakness. Negative for numbness.  Psychiatric/Behavioral: Negative for confusion.    Allergies  Ancef  Home Medications   Current Outpatient Rx  Name  Route  Sig  Dispense  Refill  . ASPIRIN 81 MG PO TBEC   Oral   Take 1 tablet (81 mg total) by mouth daily.   30 tablet   3   . CARVEDILOL 25 MG PO TABS   Oral   Take 25 mg by mouth 3 (three) times daily.         Marland Kitchen VITAMIN D 2000 UNITS PO CAPS   Oral   Take 1 capsule by mouth daily.         . FUROSEMIDE 80 MG PO TABS   Oral   Take 80 mg by mouth 3 (three) times daily.          . INSULIN ASPART 100 UNIT/ML  [place] SOLN   Subcutaneous   Inject 0-15 Units into the skin 3 (three) times daily before meals.         . INSULIN GLARGINE 100 UNIT/ML [place] SOLN   Subcutaneous   Inject 20 Units into the skin daily as needed.         Kingsley Spittle  20-37.5 MG PO TABS   Oral   Take 1 tablet by mouth 3 (three) times daily.         Marland Kitchen TAMSULOSIN HCL 0.4 MG PO CAPS   Oral   Take 1 capsule (0.4 mg total) by mouth daily.   30 capsule   3   . TRIAMCINOLONE ACETONIDE 0.5 % EX CREA   Topical   Apply 1 application topically 2 (two) times daily.           BP 131/62  Pulse 86  Temp 97.8 F (36.6 C) (Oral)  Resp 21  SpO2 92%  Physical Exam  Nursing note and vitals reviewed. Constitutional: He is oriented to person, place, and time. He appears well-developed and well-nourished.  HENT:  Head: Normocephalic and atraumatic.  Eyes: EOM are normal. Pupils are equal, round, and reactive to light.  Neck: Normal range of motion. Neck supple.  Cardiovascular: Normal rate, regular rhythm and normal heart sounds.   No murmur heard. Pulmonary/Chest:  Effort normal and breath sounds normal.  Abdominal: Soft. Bowel sounds are normal. He exhibits distension. He exhibits no mass. There is tenderness. There is no rebound and no guarding.       Mild diffuse distension.  Musculoskeletal: Normal range of motion. He exhibits edema.       Bilateral LE pitting edema.   Neurological: He is alert and oriented to person, place, and time. No cranial nerve deficit.  Skin: Skin is warm and dry.  Psychiatric: He has a normal mood and affect.    ED Course  Procedures (including critical care time)  Labs Reviewed  GLUCOSE, CAPILLARY - Abnormal; Notable for the following:    Glucose-Capillary 263 (*)     All other components within normal limits  CBC WITH DIFFERENTIAL - Abnormal; Notable for the following:    RBC 3.51 (*)     Hemoglobin 9.6 (*)     HCT 30.7 (*)     Eosinophils Relative 7 (*)     Basophils Relative 2 (*)     All other components within normal limits  COMPREHENSIVE METABOLIC PANEL - Abnormal; Notable for the following:    Glucose, Bld 259 (*)     BUN 25 (*)     Creatinine, Ser 2.05 (*)     Calcium 8.1 (*)     Albumin 2.2 (*)     AST 67 (*)     Alkaline Phosphatase 238 (*)     GFR calc non Af Amer 32 (*)     GFR calc Af Amer 37 (*)     All other components within normal limits  URINALYSIS, ROUTINE W REFLEX MICROSCOPIC - Abnormal; Notable for the following:    APPearance CLOUDY (*)     Protein, ur 30 (*)     Leukocytes, UA LARGE (*)     All other components within normal limits  PRO B NATRIURETIC PEPTIDE - Abnormal; Notable for the following:    Pro B Natriuretic peptide (BNP) 9079.0 (*)     All other components within normal limits  TROPONIN I  URINE MICROSCOPIC-ADD ON  URINE CULTURE   Dg Chest 2 View  05/25/2012  *RADIOLOGY REPORT*  Clinical Data: Shortness of breath  CHEST - 2 VIEW  Comparison: 03/26/2012  Findings: Lungs are clear. No pleural effusion or pneumothorax.  Cardiomediastinal silhouette is within normal  limits.  Mild degenerative changes of the visualized thoracolumbar spine.  IMPRESSION: No evidence of acute cardiopulmonary disease.   Original Report Authenticated  By: Charline Bills, M.D.      1. UTI (urinary tract infection)   2. CHF (congestive heart failure)   3. Failure to thrive      Date: 05/25/2012  Rate:84  Rhythm: normal sinus rhythm  QRS Axis: normal  Intervals: normal  ST/T Wave abnormalities: nonspecific ST/T changes  Conduction Disutrbances:none  Narrative Interpretation:   Old EKG Reviewed: unchanged    MDM  Patient with generalized weakness. Possible fevers at home. Possible urinary symptoms. Has not been taking his diabetes medications. X-ray shows no pneumonia. His BNP is elevated. He has many white cells in the urine and will empirically treat for UTI. Patient's been managing poorly at home and will be admitted to medicine. Discussed with Dr. Sharyn Lull.        Juliet Rude. Rubin Payor, MD 05/25/12 [phone]

## 2012-05-25 NOTE — ED Notes (Signed)
Per EMS: home health nurse called EMS and stated pt had fever 99.8, was smelling of urine and was having irregular CBG checks. Per EMS pt has not been taking his diabetes medications. Per EMS pt has no complaints.

## 2012-05-25 NOTE — ED Notes (Signed)
Per wife and pt home health nurse came today to see pt and it was her first time seeing pt; wife sts pt was on prednisone and his cbg's are in 71's; wife sts gives novolog and lantus per sliding scale and per order. Wife sts pt has not being eating as well last 2-3 days. Wife sts pt feels weak and very SOB when working with OT at home. Per wife pt is c/o being very tired all the time. Pt has hx of CHF. Wife sts dr Sharyn Lull was called by hh nurse and they were told to bring pt to ED.

## 2012-05-26 LAB — URINALYSIS, ROUTINE W REFLEX MICROSCOPIC
Glucose, UA: NEGATIVE mg/dL
Ketones, ur: NEGATIVE mg/dL
Specific Gravity, Urine: 1.013 (ref 1.005–1.030)
pH: 6.5 (ref 5.0–8.0)

## 2012-05-26 LAB — BLOOD GAS, ARTERIAL
Acid-Base Excess: 5.6 mmol/L — ABNORMAL HIGH (ref 0.0–2.0)
Bicarbonate: 29.4 mEq/L — ABNORMAL HIGH (ref 20.0–24.0)
Drawn by: 11249
O2 Content: 2 L/min
O2 Saturation: 99.1 %
Patient temperature: 98.6
pO2, Arterial: 122 mmHg — ABNORMAL HIGH (ref 80.0–100.0)

## 2012-05-26 LAB — CBC
Hemoglobin: 11.1 g/dL — ABNORMAL LOW (ref 13.0–17.0)
MCH: 27.2 pg (ref 26.0–34.0)
MCHC: 31.5 g/dL (ref 30.0–36.0)
Platelets: 229 10*3/uL (ref 150–400)
RDW: 14.3 % (ref 11.5–15.5)

## 2012-05-26 LAB — URINE MICROSCOPIC-ADD ON

## 2012-05-26 LAB — DIFFERENTIAL
Basophils Relative: 2 % — ABNORMAL HIGH (ref 0–1)
Eosinophils Absolute: 0.7 10*3/uL (ref 0.0–0.7)
Eosinophils Relative: 8 % — ABNORMAL HIGH (ref 0–5)
Lymphs Abs: 1.6 10*3/uL (ref 0.7–4.0)
Monocytes Absolute: 0.5 10*3/uL (ref 0.1–1.0)
Neutro Abs: 6 10*3/uL (ref 1.7–7.7)
Neutrophils Relative %: 67 % (ref 43–77)

## 2012-05-26 LAB — COMPREHENSIVE METABOLIC PANEL
ALT: 33 U/L (ref 0–53)
AST: 45 U/L — ABNORMAL HIGH (ref 0–37)
Albumin: 2.4 g/dL — ABNORMAL LOW (ref 3.5–5.2)
Alkaline Phosphatase: 209 U/L — ABNORMAL HIGH (ref 39–117)
Calcium: 8.4 mg/dL (ref 8.4–10.5)
GFR calc Af Amer: 41 mL/min — ABNORMAL LOW (ref >90)
Potassium: 3.7 mEq/L (ref 3.5–5.1)
Sodium: 143 mEq/L (ref 135–145)
Total Protein: 6.6 g/dL (ref 6.0–8.3)

## 2012-05-26 LAB — GLUCOSE, CAPILLARY
Glucose-Capillary: 162 mg/dL — ABNORMAL HIGH (ref 70–99)
Glucose-Capillary: 167 mg/dL — ABNORMAL HIGH (ref 70–99)
Glucose-Capillary: 184 mg/dL — ABNORMAL HIGH (ref 70–99)

## 2012-05-26 MED ORDER — SODIUM CHLORIDE 0.9 % IJ SOLN
3.0000 mL | Freq: Two times a day (BID) | INTRAMUSCULAR | Status: DC
  Administered 2012-05-26 – 2012-05-27 (×3): 3 mL via INTRAVENOUS

## 2012-05-26 MED ORDER — VITAMIN D3 25 MCG (1000 UNIT) PO TABS
2000.0000 [IU] | ORAL_TABLET | Freq: Every day | ORAL | Status: DC
  Administered 2012-05-26 – 2012-05-27 (×2): 2000 [IU] via ORAL
  Filled 2012-05-26 (×2): qty 2

## 2012-05-26 MED ORDER — INSULIN ASPART 100 UNIT/ML ~~LOC~~ SOLN
0.0000 [IU] | Freq: Three times a day (TID) | SUBCUTANEOUS | Status: DC
  Administered 2012-05-26 – 2012-05-27 (×4): 2 [IU] via SUBCUTANEOUS
  Administered 2012-05-27: 3 [IU] via SUBCUTANEOUS

## 2012-05-26 MED ORDER — ISOSORB DINITRATE-HYDRALAZINE 20-37.5 MG PO TABS
1.0000 | ORAL_TABLET | Freq: Three times a day (TID) | ORAL | Status: DC
  Administered 2012-05-26 (×2): 1 via ORAL
  Filled 2012-05-26 (×3): qty 1

## 2012-05-26 MED ORDER — HEPARIN SODIUM (PORCINE) 5000 UNIT/ML IJ SOLN
5000.0000 [IU] | Freq: Three times a day (TID) | INTRAMUSCULAR | Status: DC
  Administered 2012-05-26 – 2012-05-27 (×4): 5000 [IU] via SUBCUTANEOUS
  Filled 2012-05-26 (×6): qty 1

## 2012-05-26 MED ORDER — ASPIRIN EC 81 MG PO TBEC: 81.0000 mg | DELAYED_RELEASE_TABLET | Freq: Every day | ORAL | Status: DC

## 2012-05-26 MED ORDER — ASPIRIN EC 81 MG PO TBEC
81.0000 mg | DELAYED_RELEASE_TABLET | Freq: Every day | ORAL | Status: DC
  Administered 2012-05-26 – 2012-05-27 (×2): 81 mg via ORAL
  Filled 2012-05-26 (×2): qty 1

## 2012-05-26 MED ORDER — CARVEDILOL 12.5 MG PO TABS
12.5000 mg | ORAL_TABLET | Freq: Two times a day (BID) | ORAL | Status: DC
  Administered 2012-05-26 – 2012-05-27 (×3): 12.5 mg via ORAL
  Filled 2012-05-26 (×4): qty 1

## 2012-05-26 MED ORDER — VITAMIN D 50 MCG (2000 UT) PO CAPS: 1.0000 | ORAL_CAPSULE | Freq: Every day | ORAL | Status: DC

## 2012-05-26 MED ORDER — CIPROFLOXACIN HCL 250 MG PO TABS
250.0000 mg | ORAL_TABLET | Freq: Two times a day (BID) | ORAL | Status: DC
  Administered 2012-05-26 – 2012-05-27 (×3): 250 mg via ORAL
  Filled 2012-05-26 (×5): qty 1

## 2012-05-26 MED ORDER — TAMSULOSIN HCL 0.4 MG PO CAPS
0.4000 mg | ORAL_CAPSULE | Freq: Every day | ORAL | Status: DC
  Administered 2012-05-26 – 2012-05-27 (×2): 0.4 mg via ORAL
  Filled 2012-05-26 (×2): qty 1

## 2012-05-26 NOTE — Progress Notes (Addendum)
Call received from [facility] 4 east 2100. Pt with manual bp 80s systolic and lethargic, slow to respond. Upon my arrival 2105 pt found awake in bed lethargic and slow to respond but follows commands, denies pain and oriented X 4. Wife at bedside. Per RN had heavy perspiration about an hour ago, blood sugar was checked and wnl. He did receive his BP meds at 1810. Per pt wife Malachi Bonds pt had a similar episode at home with sweating and low bp just prior to coming to the hospital. Pt placed on RRT monitor with bp yielding 116/60 at 2115. See VS flowsheet for further VS. Dr Sharyn Lull called and updated on pt status per [facility]. MD ordered abg and dopamine gtt for bp, call back received from Dr Sharyn Lull and updated on improved bp, dopamine order canceled and parameters given to hold cardiac meds based on pt blood pressure. Melinda RN encouraged to recheck pt bp at midnight and q4 hrs. Pt ABG completed and resulted at 2200 with slightly elevated ph other wise WNL, results given to Dyke Maes RN. Melinda encouraged to notify myself and MD if further BP problems.  Pt left resting in bed awake and more talkative, denies pain.

## 2012-05-26 NOTE — Progress Notes (Signed)
Inpatient Diabetes Program Recommendations  AACE/ADA: New Consensus Statement on Inpatient Glycemic Control (2013)  Target Ranges:  Prepandial:   less than 140 mg/dL      Peak postprandial:   less than 180 mg/dL (1-2 hours)      Critically ill patients:  140 - 180 mg/dL   Reason for Visit: Hyperglycemia  66 year old male with past medical history significant for multiple medical problems i.e. hypertension, insulin requiring diabetes mellitus, chronic kidney disease stage IV, history of interstitial nephritis, morbid obesity, history of MSSA bacteremia secondary to catheter-related sepsis and recent past, history of exfoliative generalized dermatitis, came to the ER by EMS complaining of generalized weakness fatigue and inability to urinate and low-grade fever and labile blood sugar. Wife states he was taking large amt of Lantus at home when he was on steroids, but recently has tapered back to Lantus 20 units QHS and Novolog s/s.  Discussed blood sugar monitoring before meals and bedtime and wife voices understanding.  Discussed hypoglycemia and treatment.  Results for BARNELL, SHIEH (MRN [id number]) as of 05/26/2012 12:00  Ref. Range 05/26/2012 07:50  Chloride Latest Range: 96-112 mEq/L 105  CO2 Latest Range: 19-32 mEq/L 29  BUN Latest Range: 6-23 mg/dL 24 (H)  Creatinine Latest Range: 0.50-1.35 mg/dL 4.09 (H)  Calcium Latest Range: 8.4-10.5 mg/dL 8.4  GFR calc non Af Amer Latest Range: >90 mL/min 36 (L)  GFR calc Af Amer Latest Range: >90 mL/min 41 (L)  Glucose Latest Range: 70-99 mg/dL 811 (H)  Magnesium Latest Range: 1.5-2.5 mg/dL 2.1  Alkaline Phosphatase Latest Range: 39-117 U/L 209 (H)  Albumin Latest Range: 3.5-5.2 g/dL 2.4 (L)  AST Latest Range: 0-37 U/L 45 (H)  ALT Latest Range: 0-53 U/L 33  Total Protein Latest Range: 6.0-8.3 g/dL 6.6  Total Bilirubin Latest Range: 0.3-1.2 mg/dL 0.4  Results for VINSON, TIETZE (MRN [id number]) as of 05/26/2012 12:00  Ref. Range 05/25/2012  17:47 05/26/2012 07:23  Glucose-Capillary Latest Range: 70-99 mg/dL 213 (H) 086 (H)  Results for ASKARI, KINLEY (MRN [id number]) as of 05/26/2012 12:00  Ref. Range 03/09/2012 16:40  Hemoglobin A1C Latest Range: <5.7 % 10.5 (H)     Inpatient Diabetes Program Recommendations Insulin - Basal: Add Lantus 15 units QHS - titrate up until FBS < 180 mg/dL  Note: Will continue to follow.  Pt and wife state they have received diabetes education from previous admissions.  Would like guidance for Lantus dose for home since pt is not on steroids.  Thank you.  Ailene Ards, RD, LDN, CDE Inpatient Diabetes Coordinator [phone]

## 2012-05-26 NOTE — Progress Notes (Signed)
Pts mental status improved, talking and seems much more alert, had snack, updated Dr. Sharyn Lull of pts condition.  Will continue to assess throughout night. Barnett Hatter P

## 2012-05-26 NOTE — Progress Notes (Signed)
OT Cancellation Note  Patient Details Name: Davyn Morandi MRN: [id number] DOB: 1945/12/25   Cancelled Treatment:    Reason Eval/Treat Not Completed: Other (comment) (Pt's refusal to participate)  Zonya Gudger A OTR/L [phone] 05/26/2012, 3:35 PM

## 2012-05-26 NOTE — Care Management Note (Unsigned)
    Page 1 of 1   05/27/2012     3:47:24 PM   CARE MANAGEMENT NOTE 05/27/2012  Patient:  [name], [name]   Account Number:  [id number]  Date Initiated:  05/26/2012  Documentation initiated by:  Lanier Clam  Subjective/Objective Assessment:   ADMITTED W/GEN'L WEAKNESS,FATIGUE,INABILITY TO URINATE.ZO:XWRU BACTEREMIA.     Action/Plan:   FROM HOME W/SPOUSE.   Anticipated DC Date:  05/28/2012   Anticipated DC Plan:  HOME W HOME HEALTH SERVICES      DC Planning Services  CM consult      Choice offered to / List presented to:             Status of service:  In process, will continue to follow Medicare Important Message given?   (If response is "NO", the following Medicare IM given date fields will be blank) Date Medicare IM given:   Date Additional Medicare IM given:    Discharge Disposition:    Per UR Regulation:  Reviewed for med. necessity/level of care/duration of stay  If discussed at Long Length of Stay Meetings, dates discussed:    Comments:  05/27/12 De Jaworski RN,BSN NCM [phone] RAPID RESPONSE CALLED LAST NIGHT-LOW BP,LETHARGIC.[facility]AN(LIASON0FOLLOWING FOR HHRN/PT/OT. 05/26/12 Jennavie Martinek RN,BSN NCM [phone] SPOKE TO SPOUSE ABOUT D/C PLANS.WANTS HOME W/HH.USED [facility] IN PAST,BUT DECLINE THEIR SERVICES.[facility] CHOSEN FOR HH.[facility](LIAS[facility]) INFORMED & FOLLOWING.RECOMMEND HHRN/PT.SPOUSE SAYS SHE ASSISTS W/ADL'S. PT/OT CONS ORDERED,AWAIT RECOMMENDATIONS.

## 2012-05-26 NOTE — H&P (Signed)
[name] is an 66 y.o. male.   Chief Complaint: Generalized weakness/inability to urinate and low-grade fever HPI: Patient is 66 year old male with past medical history significant for multiple medical problems i.e. hypertension, insulin requiring diabetes mellitus, chronic kidney disease stage IV, history of interstitial nephritis, morbid obesity, history of MSSA bacteremia secondary to catheter-related sepsis and recent past, history of exfoliative generalized dermatitis, came to the ER by EMS complaining of generalized weakness fatigue and inability to urinate and low-grade fever and labile blood sugar. Patient states his wife cannot take care of him at home and has generalized weakness in arms or legs. Patient denies any chills but complains of low-grade fever. Denies any cough or shortness of breath. Denies any chest pain nausea vomiting or diaphoresis. States his blood sugar has been labile.  Past Medical History  Diagnosis Date  . Diabetes mellitus   . Hypertension   . Hyperlipidemia   . CHF (congestive heart failure)   . Enlarged heart   . Pneumonia     hx of     Past Surgical History  Procedure Date  . Right leg fracture   . Skin graft   . Knee surgery   . Tee without cardioversion 03/17/2012    Procedure: TRANSESOPHAGEAL ECHOCARDIOGRAM (TEE);  Surgeon: Ricki Rodriguez, MD;  Location: [facility] ENDOSCOPY;  Service: Cardiovascular;  Laterality: N/A;    History reviewed. No pertinent family history. Social History:  reports that he quit smoking about 28 years ago. He has never used smokeless tobacco. He reports that he does not drink alcohol or use illicit drugs.  Allergies:  Allergies  Allergen Reactions  . Ancef (Cefazolin)     rash    Medications Prior to Admission  Medication Sig Dispense Refill  . aspirin EC 81 MG EC tablet Take 1 tablet (81 mg total) by mouth daily.  30 tablet  3  . carvedilol (COREG) 25 MG tablet Take 25 mg by mouth 3 (three) times daily.      .  Cholecalciferol (VITAMIN D) 2000 UNITS CAPS Take 1 capsule by mouth daily.      . furosemide (LASIX) 80 MG tablet Take 80 mg by mouth 3 (three) times daily.       . insulin aspart (NOVOLOG) 100 UNIT/ML injection Inject 0-15 Units into the skin 3 (three) times daily before meals.      . insulin glargine (LANTUS) 100 UNIT/ML injection Inject 20 Units into the skin daily as needed.      . isosorbide-hydrALAZINE (BIDIL) 20-37.5 MG per tablet Take 1 tablet by mouth 3 (three) times daily.      . Tamsulosin HCl (FLOMAX) 0.4 MG CAPS Take 1 capsule (0.4 mg total) by mouth daily.  30 capsule  3  . triamcinolone cream (KENALOG) 0.5 % Apply 1 application topically 2 (two) times daily.        Results for orders placed during the hospital encounter of 05/25/12 (from the past 48 hour(s))  GLUCOSE, CAPILLARY     Status: Abnormal   Collection Time   05/25/12  5:47 PM      Component Value Range Comment   Glucose-Capillary 263 (*) 70 - 99 mg/dL    Comment 1 Notify RN     CBC WITH DIFFERENTIAL     Status: Abnormal   Collection Time   05/25/12  6:10 PM      Component Value Range Comment   WBC 7.3  4.0 - 10.5 K/uL    RBC 3.51 (*) 4.22 -  5.81 MIL/uL    Hemoglobin 9.6 (*) 13.0 - 17.0 g/dL    HCT 16.1 (*) 09.6 - 52.0 %    MCV 87.5  78.0 - 100.0 fL    MCH 27.4  26.0 - 34.0 pg    MCHC 31.3  30.0 - 36.0 g/dL    RDW 04.5  40.9 - 81.1 %    Platelets 224  150 - 400 K/uL    Neutrophils Relative 71  43 - 77 %    Lymphocytes Relative 14  12 - 46 %    Monocytes Relative 6  3 - 12 %    Eosinophils Relative 7 (*) 0 - 5 %    Basophils Relative 2 (*) 0 - 1 %    Neutro Abs 5.3  1.7 - 7.7 K/uL    Lymphs Abs 1.0  0.7 - 4.0 K/uL    Monocytes Absolute 0.4  0.1 - 1.0 K/uL    Eosinophils Absolute 0.5  0.0 - 0.7 K/uL    Basophils Absolute 0.1  0.0 - 0.1 K/uL    Smear Review MORPHOLOGY UNREMARKABLE     COMPREHENSIVE METABOLIC PANEL     Status: Abnormal   Collection Time   05/25/12  6:10 PM      Component Value Range  Comment   Sodium 142  135 - 145 mEq/L    Potassium 3.8  3.5 - 5.1 mEq/L    Chloride 103  96 - 112 mEq/L    CO2 28  19 - 32 mEq/L    Glucose, Bld 259 (*) 70 - 99 mg/dL    BUN 25 (*) 6 - 23 mg/dL    Creatinine, Ser 9.14 (*) 0.50 - 1.35 mg/dL    Calcium 8.1 (*) 8.4 - 10.5 mg/dL    Total Protein 6.5  6.0 - 8.3 g/dL    Albumin 2.2 (*) 3.5 - 5.2 g/dL    AST 67 (*) 0 - 37 U/L    ALT 35  0 - 53 U/L    Alkaline Phosphatase 238 (*) 39 - 117 U/L    Total Bilirubin 0.3  0.3 - 1.2 mg/dL    GFR calc non Af Amer 32 (*) >90 mL/min    GFR calc Af Amer 37 (*) >90 mL/min   TROPONIN I     Status: Normal   Collection Time   05/25/12  6:10 PM      Component Value Range Comment   Troponin I <0.30  <0.30 ng/mL   PRO B NATRIURETIC PEPTIDE     Status: Abnormal   Collection Time   05/25/12  6:10 PM      Component Value Range Comment   Pro B Natriuretic peptide (BNP) 9079.0 (*) 0 - 125 pg/mL   URINALYSIS, ROUTINE W REFLEX MICROSCOPIC     Status: Abnormal   Collection Time   05/25/12  8:23 PM      Component Value Range Comment   Color, Urine YELLOW  YELLOW    APPearance CLOUDY (*) CLEAR    Specific Gravity, Urine 1.018  1.005 - 1.030    pH 5.0  5.0 - 8.0    Glucose, UA NEGATIVE  NEGATIVE mg/dL    Hgb urine dipstick NEGATIVE  NEGATIVE    Bilirubin Urine NEGATIVE  NEGATIVE    Ketones, ur NEGATIVE  NEGATIVE mg/dL    Protein, ur 30 (*) NEGATIVE mg/dL    Urobilinogen, UA 1.0  0.0 - 1.0 mg/dL    Nitrite NEGATIVE  NEGATIVE  Leukocytes, UA LARGE (*) NEGATIVE   URINE MICROSCOPIC-ADD ON     Status: Normal   Collection Time   05/25/12  8:23 PM      Component Value Range Comment   Squamous Epithelial / LPF RARE  RARE    WBC, UA TOO NUMEROUS TO COUNT  <3 WBC/hpf    RBC / HPF 0-2  <3 RBC/hpf    Bacteria, UA RARE  RARE    Urine-Other RARE YEAST     GLUCOSE, CAPILLARY     Status: Abnormal   Collection Time   05/26/12  7:23 AM      Component Value Range Comment   Glucose-Capillary 167 (*) 70 - 99 mg/dL    CBC     Status: Abnormal   Collection Time   05/26/12  7:50 AM      Component Value Range Comment   WBC 9.0  4.0 - 10.5 K/uL    RBC 4.08 (*) 4.22 - 5.81 MIL/uL    Hemoglobin 11.1 (*) 13.0 - 17.0 g/dL    HCT 47.8 (*) 29.5 - 52.0 %    MCV 86.3  78.0 - 100.0 fL    MCH 27.2  26.0 - 34.0 pg    MCHC 31.5  30.0 - 36.0 g/dL    RDW 62.1  30.8 - 65.7 %    Platelets 229  150 - 400 K/uL   COMPREHENSIVE METABOLIC PANEL     Status: Abnormal   Collection Time   05/26/12  7:50 AM      Component Value Range Comment   Sodium 143  135 - 145 mEq/L    Potassium 3.7  3.5 - 5.1 mEq/L    Chloride 105  96 - 112 mEq/L    CO2 29  19 - 32 mEq/L    Glucose, Bld 210 (*) 70 - 99 mg/dL    BUN 24 (*) 6 - 23 mg/dL    Creatinine, Ser 8.46 (*) 0.50 - 1.35 mg/dL    Calcium 8.4  8.4 - 96.2 mg/dL    Total Protein 6.6  6.0 - 8.3 g/dL    Albumin 2.4 (*) 3.5 - 5.2 g/dL    AST 45 (*) 0 - 37 U/L    ALT 33  0 - 53 U/L    Alkaline Phosphatase 209 (*) 39 - 117 U/L    Total Bilirubin 0.4  0.3 - 1.2 mg/dL    GFR calc non Af Amer 36 (*) >90 mL/min    GFR calc Af Amer 41 (*) >90 mL/min    Dg Chest 2 View  05/25/2012  *RADIOLOGY REPORT*  Clinical Data: Shortness of breath  CHEST - 2 VIEW  Comparison: 03/26/2012  Findings: Lungs are clear. No pleural effusion or pneumothorax.  Cardiomediastinal silhouette is within normal limits.  Mild degenerative changes of the visualized thoracolumbar spine.  IMPRESSION: No evidence of acute cardiopulmonary disease.   Original Report Authenticated By: Charline Bills, M.D.     Review of Systems  Constitutional: Positive for fever. Negative for chills.  Eyes: Negative for blurred vision and double vision.  Respiratory: Negative for cough, hemoptysis, sputum production and shortness of breath.   Cardiovascular: Negative for chest pain, palpitations, orthopnea and claudication.  Gastrointestinal: Negative for heartburn, nausea and vomiting.  Genitourinary: Positive for dysuria.   Musculoskeletal: Positive for myalgias.  Skin: Negative for itching and rash.  Neurological: Negative for dizziness and headaches.    Blood pressure 163/77, pulse 97, temperature 98.1 F (36.7 C), temperature source Oral,  resp. rate 20, height 5\' 11"  (1.803 m), weight 107.548 kg (237 lb 1.6 oz), SpO2 95.00%. Physical Exam  Constitutional: He appears well-developed and well-nourished.  HENT:  Head: Normocephalic and atraumatic.  Eyes: Conjunctivae normal are normal. Pupils are equal, round, and reactive to light. Left eye exhibits no discharge. No scleral icterus.  Neck: Normal range of motion. Neck supple. No JVD present. No tracheal deviation present. No thyromegaly present.  Cardiovascular: Normal rate and regular rhythm.        Soft systolic murmur noted no S3 gallop  Respiratory: Effort normal and breath sounds normal. No respiratory distress. He has no wheezes. He has no rales.  GI: Soft. Bowel sounds are normal. He exhibits no distension. There is no tenderness. There is no rebound.  Musculoskeletal:       No clubbing cyanosis trace edema noted     Assessment/Plan Urinary tract infection Failure to thrive Hypertension Insulin requiring diabetes mellitus Chronic kidney disease stage IV Morbid obesity History of interstitial nephritis in recent past History of MSSA bacteremia secondary to catheter related sepsis and recent past History of exfoliative dermatitis Plan As per orders OT PT consult Social service for discharge planning  Robynn Pane 05/26/2012, 8:29 AM

## 2012-05-27 LAB — URINE CULTURE
Colony Count: 35000
Colony Count: 40000

## 2012-05-27 LAB — BASIC METABOLIC PANEL
Chloride: 106 mEq/L (ref 96–112)
GFR calc Af Amer: 34 mL/min — ABNORMAL LOW (ref >90)
GFR calc non Af Amer: 30 mL/min — ABNORMAL LOW (ref >90)
Potassium: 3.9 mEq/L (ref 3.5–5.1)
Sodium: 146 mEq/L — ABNORMAL HIGH (ref 135–145)

## 2012-05-27 LAB — GLUCOSE, CAPILLARY
Glucose-Capillary: 184 mg/dL — ABNORMAL HIGH (ref 70–99)
Glucose-Capillary: 169 mg/dL — ABNORMAL HIGH (ref 70–99)

## 2012-05-27 LAB — CBC
HCT: 33.9 % — ABNORMAL LOW (ref 39.0–52.0)
Hemoglobin: 10.7 g/dL — ABNORMAL LOW (ref 13.0–17.0)
WBC: 8.9 10*3/uL (ref 4.0–10.5)

## 2012-05-27 MED ORDER — FUROSEMIDE 80 MG PO TABS: 80.0000 mg | ORAL_TABLET | Freq: Two times a day (BID) | ORAL | Status: DC

## 2012-05-27 MED ORDER — CARVEDILOL 25 MG PO TABS: 12.5000 mg | ORAL_TABLET | Freq: Two times a day (BID) | ORAL | Status: DC

## 2012-05-27 MED ORDER — ISOSORB DINITRATE-HYDRALAZINE 20-37.5 MG PO TABS: 1.0000 | ORAL_TABLET | Freq: Two times a day (BID) | ORAL | Status: DC

## 2012-05-27 MED ORDER — CIPROFLOXACIN HCL 250 MG PO TABS: 250.0000 mg | ORAL_TABLET | Freq: Two times a day (BID) | ORAL | Status: DC

## 2012-05-27 MED ORDER — DIPHENHYDRAMINE HCL 25 MG PO CAPS
25.0000 mg | ORAL_CAPSULE | Freq: Two times a day (BID) | ORAL | Status: DC | PRN
  Administered 2012-05-27: 25 mg via ORAL
  Filled 2012-05-27: qty 1

## 2012-05-27 MED ORDER — INSULIN GLARGINE 100 UNIT/ML ~~LOC~~ SOLN: 10.0000 [IU] | Freq: Two times a day (BID) | SUBCUTANEOUS | Status: DC

## 2012-05-27 NOTE — Evaluation (Signed)
Physical Therapy Evaluation Patient Details Name: [name] MRN: [id number] DOB: July 25, 1945 Today's Date: 05/27/2012 Time: 3086-5784 PT Time Calculation (min): 22 min  PT Assessment / Plan / Recommendation Clinical Impression  66 y.o. male admitted with CHF, UTI, FTT. Today he ambulated 37' with RW and supervision. Pt states he has 24* care at home from his wife. No DME needed, HHPT recommended. Pt would benefit from acute PT to [facility] safety and independence with mobility.     PT Assessment  Patient needs continued PT services    Follow Up Recommendations  Home health PT    Does the patient have the potential to tolerate intense rehabilitation      Barriers to Discharge None      Equipment Recommendations  Other (comment) (may need home O2)    Recommendations for Other Services OT consult   Frequency Min 3X/week    Precautions / Restrictions Precautions Precaution Comments: monitor O2 sats Restrictions Weight Bearing Restrictions: No   Pertinent Vitals/Pain **SaO2 90% on 2L O2 with walking, no dyspnea noted*      Mobility  Bed Mobility Bed Mobility: Left Sidelying to Sit Left Sidelying to Sit: 6: Modified independent (Device/Increase time);With rails;HOB elevated Transfers Transfers: Sit to Stand;Stand to Sit Sit to Stand: From bed;From elevated surface;5: Supervision Stand to Sit: 5: Supervision;With armrests;To chair/3-in-1 Ambulation/Gait Ambulation/Gait Assistance: 5: Supervision Ambulation Distance (Feet): 120 Feet Assistive device: Rolling walker Ambulation/Gait Assistance Details: pt walked with 2L O2 [place], SaO2 90% Gait Pattern: Within Functional Limits General Gait Details: no LOB, no deviations noted    Shoulder Instructions     Exercises     PT Diagnosis: Generalized weakness  PT Problem List: Decreased activity tolerance;Cardiopulmonary status limiting activity;Decreased mobility PT Treatment Interventions: Gait training;Stair  training;Functional mobility training;Therapeutic activities;Therapeutic exercise;Patient/family education   PT Goals Acute Rehab PT Goals PT Goal Formulation: With patient Time For Goal Achievement: 06/03/12 Potential to Achieve Goals: Good Pt will go Supine/Side to Sit: Independently;with HOB 0 degrees PT Goal: Supine/Side to Sit - Progress: Goal set today Pt will go Sit to Stand: with modified independence PT Goal: Sit to Stand - Progress: Goal set today Pt will Ambulate: >150 feet;with modified independence;with rolling walker PT Goal: Ambulate - Progress: Goal set today Pt will Go Up / Down Stairs: 1-2 stairs;with min assist;with rolling walker PT Goal: Up/Down Stairs - Progress: Goal set today  Visit Information  Last PT Received On: 05/27/12    Subjective Data  Subjective: I don't make plans for anything bc my wife just changes the plan.  Patient Stated Goal: none stated   Prior Functioning  Home Living Lives With: Spouse Available Help at Discharge: Family Home Access: Stairs to enter Entrance Stairs-Number of Steps: 2 Entrance Stairs-Rails: None Home Layout: One level Bathroom Shower/Tub: Naval architect Equipment: Bedside commode/3-in-1;Walker - rolling;Wheelchair - manual Additional Comments: hospital bed Prior Function Level of Independence: Needs assistance Needs Assistance: Bathing Driving: Yes Comments: used RW Communication Communication: No difficulties    Cognition  Overall Cognitive Status: Appears within functional limits for tasks assessed/performed Arousal/Alertness: Awake/alert Orientation Level: Appears intact for tasks assessed Behavior During Session: [facility] for tasks performed    Extremity/Trunk Assessment Right Upper Extremity Assessment RUE ROM/Strength/Tone: [facility] for tasks assessed Left Upper Extremity Assessment LUE ROM/Strength/Tone: WFL for tasks assessed Right Lower Extremity Assessment RLE ROM/Strength/Tone: Within  functional levels RLE Sensation: WFL - Light Touch RLE Coordination: WFL - gross/fine motor Left Lower Extremity Assessment LLE ROM/Strength/Tone: Within functional levels LLE  Sensation: WFL - Light Touch LLE Coordination: WFL - gross/fine motor Trunk Assessment Trunk Assessment: Normal   Balance Balance Balance Assessed: Yes Static Sitting Balance Static Sitting - Balance Support: Feet supported;No upper extremity supported Static Sitting - Level of Assistance: 7: Independent Static Sitting - Comment/# of Minutes: 4  End of Session PT - End of Session Equipment Utilized During Treatment: Gait belt Activity Tolerance: Patient tolerated treatment well Patient left: in chair;with call bell/phone within reach Nurse Communication: Mobility status  GP Functional Assessment Tool Used: clinical judgement Functional Limitation: Mobility: Walking and moving around Mobility: Walking and Moving Around Current Status (Z6109): At least 20 percent but less than 40 percent impaired, limited or restricted Mobility: Walking and Moving Around Goal Status [phone]): At least 1 percent but less than 20 percent impaired, limited or restricted   Tamala Ser 05/27/2012, 11:20 AM  [phone]

## 2012-05-27 NOTE — Progress Notes (Signed)
Pt VS are stable, pt found with oxygen off and sat was 82, replaced oxygen at 2liters and sats increased to 100.  Pt concerned because he does not have oxygen at home.  Will continue to observe. [name] P

## 2012-05-27 NOTE — Evaluation (Signed)
Occupational Therapy Evaluation Patient Details Name: [name] MRN: [id number] DOB: Dec 30, 1945 Today's Date: 05/27/2012 Time: 4098-1191 OT Time Calculation (min): 19 min  OT Assessment / Plan / Recommendation Clinical Impression    66 y.o. male admitted with CHF, UTI, FTT. Skilled OT indicated to maximize independence with BADLs to supervision level in prep for d/c home with HHOT.    OT Assessment  Patient needs continued OT Services    Follow Up Recommendations  Home health OT    Barriers to Discharge      Equipment Recommendations  None recommended by OT    Recommendations for Other Services    Frequency  Min 2X/week    Precautions / Restrictions Precautions Precautions: Fall Precaution Comments: monitor O2 sats  Restrictions Weight Bearing Restrictions: No   Pertinent Vitals/Pain Denied pain    ADL  Grooming: Set up Where Assessed - Grooming: Unsupported sitting Upper Body Bathing: Set up Where Assessed - Upper Body Bathing: Unsupported sitting Lower Body Bathing: Minimal assistance Where Assessed - Lower Body Bathing: Supported sit to stand Upper Body Dressing: Set up Where Assessed - Upper Body Dressing: Unsupported sitting Lower Body Dressing: Moderate assistance Where Assessed - Lower Body Dressing: Supported sit to Pharmacist, hospital: Supervision/safety Statistician Method: Sit to Barista: Other (comment) Nurse, children's) Toileting - Clothing Manipulation and Hygiene: Minimal assistance Where Assessed - Toileting Clothing Manipulation and Hygiene: Sit to stand from 3-in-1 or toilet Equipment Used: Rolling walker Transfers/Ambulation Related to ADLs: Pt ambulated 120 feet in the hallway with supervision.    OT Diagnosis: Generalized weakness  OT Problem List: Decreased activity tolerance;Decreased knowledge of use of DME or AE OT Treatment Interventions: Self-care/ADL training;Therapeutic activities;DME and/or AE  instruction;Patient/family education   OT Goals Acute Rehab OT Goals OT Goal Formulation: With patient Time For Goal Achievement: 06/10/12 Potential to Achieve Goals: Good ADL Goals Pt Will Perform Grooming: Standing at sink;with modified independence (x 3 tasks to improve standing activity tolerance.) ADL Goal: Grooming - Progress: Goal set today Pt Will Transfer to Toilet: with modified independence;Ambulation;with DME ADL Goal: Toilet Transfer - Progress: Goal set today Pt Will Perform Toileting - Clothing Manipulation: with modified independence;Sitting on 3-in-1 or toilet;Standing ADL Goal: Toileting - Clothing Manipulation - Progress: Goal set today Pt Will Perform Toileting - Hygiene: with modified independence;Sit to stand from 3-in-1/toilet ADL Goal: Toileting - Hygiene - Progress: Goal set today Additional ADL Goal #1: Pt will complete all bathing and dressing with supervision taking prn RBs without prompting. ADL Goal: Additional Goal #1 - Progress: Goal set today  Visit Information  Last OT Received On: 05/27/12 Assistance Needed: +1 PT/OT Co-Evaluation/Treatment: Yes    Subjective Data  Subjective: My wife is cooking Christmas dinner this year. Patient Stated Goal: To return home tomorrow.   Prior Functioning     Home Living Lives With: Spouse Available Help at Discharge: Family Type of Home: House Home Access: Stairs to enter Secretary/administrator of Steps: 2 Entrance Stairs-Rails: None Home Layout: One level Bathroom Shower/Tub: Health visitor: Standard Home Adaptive Equipment: Bedside commode/3-in-1;Walker - rolling;Wheelchair - manual Prior Function Level of Independence: Needs assistance Needs Assistance: Bathing;Meal Prep;Light Housekeeping Meal Prep: Total Light Housekeeping: Total Driving: Yes Vocation: Retired Comments: Pt did not specify how much assist he actually needs with bathing. Communication Communication: No  difficulties Dominant Hand: Right         Vision/Perception     Cognition  Overall Cognitive Status: Appears within functional limits for tasks  assessed/performed Arousal/Alertness: Awake/alert Orientation Level: Appears intact for tasks assessed Behavior During Session: Flat affect    Extremity/Trunk Assessment Right Upper Extremity Assessment RUE ROM/Strength/Tone: WFL for tasks assessed Left Upper Extremity Assessment LUE ROM/Strength/Tone: WFL for tasks assessed Right Lower Extremity Assessment RLE ROM/Strength/Tone: Within functional levels RLE Sensation: WFL - Light Touch RLE Coordination: WFL - gross/fine motor Left Lower Extremity Assessment LLE ROM/Strength/Tone: Within functional levels LLE Sensation: WFL - Light Touch LLE Coordination: WFL - gross/fine motor Trunk Assessment Trunk Assessment: Normal     Mobility Bed Mobility Bed Mobility: Left Sidelying to Sit Left Sidelying to Sit: 6: Modified independent (Device/Increase time) Transfers Sit to Stand: 5: Supervision;With upper extremity assist;From bed Stand to Sit: 5: Supervision;With upper extremity assist;With armrests;To chair/3-in-1 Details for Transfer Assistance: min cues for hand placement and safety.     Shoulder Instructions     Exercise     Balance Balance Balance Assessed: Yes Static Sitting Balance Static Sitting - Balance Support: Feet supported;No upper extremity supported Static Sitting - Level of Assistance: 7: Independent Static Sitting - Comment/# of Minutes: 4   End of Session OT - End of Session Equipment Utilized During Treatment: Gait belt Activity Tolerance: Patient tolerated treatment well Patient left: in chair;with call bell/phone within reach  GO Functional Assessment Tool Used: Clinical Judgement Functional Limitation: Self care Self Care Current Status (U9811): At least 20 percent but less than 40 percent impaired, limited or restricted Self Care Goal Status  (B1478): At least 1 percent but less than 20 percent impaired, limited or restricted   [name] A OTR/L [phone] 05/27/2012, 3:08 PM

## 2012-05-28 NOTE — Discharge Summary (Signed)
[name], [name]             ACCOUNT NO.:  [id number]  MEDICAL RECORD NO.:  [id number]  LOCATION:  1421                         FACILITY:  [facility]  PHYSICIAN:  Belford Pascucci N. Sharyn Lull, M.D. DATE OF BIRTH:  20-Nov-1945  DATE OF ADMISSION:  05/25/2012 DATE OF DISCHARGE:  05/27/2012                              DISCHARGE SUMMARY   ADMITTING DIAGNOSES: 1. Probable urinary tract infection, failure to thrive. 2. Hypertension. 3. Insulin-requiring diabetes mellitus. 4. Chronic kidney disease, stage IV. 5. Morbid obesity. 6. 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 7. History of methicillin-susceptible Staphylococcus aureus bacteremia     secondary to catheter-related sepsis in the recent past. 8. History of exfoliative dermatitis.  DISCHARGE DIAGNOSES: 1. Resolving urinary tract infection. 2. Hypertension. 3. Insulin-requiring diabetes mellitus. 4. Chronic kidney disease, stage IV. 5. Morbid obesity. 6. 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 7. History of methicillin-susceptible Staphylococcus aureus bacteremia     secondary to catheter-related sepsis in the recent past. 8. History of exfoliative dermatitis.  DISCHARGE HOME MEDICATIONS: 1. Ciprofloxacin 250 mg 1 tablet twice daily for 7 days. 2. Carvedilol 12.5 mg twice daily. 3. Lasix 80 mg twice daily. 4. Lantus insulin 10 units twice daily. 5. BiDil 1 tablet twice daily. 6. Enteric-coated aspirin 81 mg 1 tablet daily. 7. Flomax 0.4 mg capsule daily. 8. Kenalog cream apply locally twice daily as before. 9. Vitamin D 2000 units capsule, 1 capsule daily.  DIET:  Low-salt, low-cholesterol, 1800 calories, ADA diet.  The patient has been advised to monitor blood sugar twice daily and chart.  Follow up with me in 1 week.  CONDITION AT DISCHARGE:  Stable.  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  He came to the ER by EMS complaining of generalized weakness, fatigue, inability to urinate and low-grade fever, and labile blood sugar.  The patient states his wife cannot take care of him at home, and has generalized weakness in arms and legs.  The patient denies any chills, but complains of low-grade fever.  Denies any cough or shortness of breath.  Denies any chest pain, nausea, vomiting, or diaphoresis. Denies any blurring of vision or slurred speech.  PAST MEDICAL HISTORY:  As above.  PAST SURGICAL HISTORY:  He had right leg fracture in the past, had skin grafting in the past, had knee surgery in the past, had TEE in the past, which was negative for vegetations.  PHYSICAL EXAMINATION:  VITAL SIGNS:  His blood pressure was 163/77, pulse was 97.  He was afebrile. HEENT:  Conjunctivae was pink. NECK:  Supple.  No JVD.  No bruit. LUNGS:  Clear to auscultation without rhonchi or rales. CARDIOVASCULAR:  S1, S2 was normal.  There was soft systolic murmur.  No S3 gallop. ABDOMEN:  Soft.  Bowel sounds were present. EXTREMITIES:  There was no clubbing, cyanosis.  There was trace  edema.  LABORATORY DATA:  His sodium was 142, potassium 3.8, BUN 25, creatinine 2.05.  Hemoglobin was 9.6, hematocrit 30.7, white count of 7.3. Troponin-I was negative.  Pro-BNP has been elevated at 9079.  Repeat electrolytes; sodium 146, potassium 3.9, BUN 27, creatinine 2.18, glucose 191.  Hemoglobin was 10.7, hematocrit 33.9, white count of 8.9. Hemoglobin A1c was 7.4.  Urinalysis showed too numerous WBCs count, there was large leukocytes.  Cultures showed some yeast,  colony count was only 35,000.  HOSPITAL COURSE:  The patient was admitted to Telemetry Unit, and was received one dose of IV Cipro and then switched to p.o.  Cipro with improvement in his urinary symptoms.  The patient remained afebrile during the hospital stay.  OT/PT consultation was called.  The patient was ambulated in the hallway today.  Discussed with the patient at length regarding skilled nursing facility placement, but states he wanted to go home.  We will arrange for OT/PT as outpatient and the patient will be discharged home.  The patient has been advised to monitor blood pressure and blood sugar daily and an advised to cooperate with physical and occupational therapy, and ambulate as tolerated.     Eduardo Osier. Sharyn Lull, M.D.     MNH/MEDQ  D:  05/27/2012  T:  05/28/2012  Job:  [id number]

## 2012-06-03 ENCOUNTER — Encounter (HOSPITAL_COMMUNITY): Payer: Self-pay | Admitting: Neurology

## 2012-06-03 ENCOUNTER — Emergency Department (HOSPITAL_COMMUNITY): Payer: Medicare Other

## 2012-06-03 ENCOUNTER — Observation Stay (HOSPITAL_COMMUNITY)
Admission: EM | Admit: 2012-06-03 | Discharge: 2012-06-06 | Disposition: A | Discharge status: Home / Self Care | Payer: Medicare Other | Attending: Cardiology | Admitting: Cardiology

## 2012-06-03 DIAGNOSIS — E119 Type 2 diabetes mellitus without complications: Secondary | ICD-10-CM | POA: Insufficient documentation

## 2012-06-03 DIAGNOSIS — N184 Chronic kidney disease, stage 4 (severe): Secondary | ICD-10-CM | POA: Insufficient documentation

## 2012-06-03 DIAGNOSIS — I129 Hypertensive chronic kidney disease with stage 1 through stage 4 chronic kidney disease, or unspecified chronic kidney disease: Secondary | ICD-10-CM | POA: Insufficient documentation

## 2012-06-03 DIAGNOSIS — R531 Weakness: Secondary | ICD-10-CM

## 2012-06-03 DIAGNOSIS — E870 Hyperosmolality and hypernatremia: Secondary | ICD-10-CM | POA: Insufficient documentation

## 2012-06-03 DIAGNOSIS — R55 Syncope and collapse: Principal | ICD-10-CM

## 2012-06-03 DIAGNOSIS — M6250 Muscle wasting and atrophy, not elsewhere classified, unspecified site: Secondary | ICD-10-CM | POA: Insufficient documentation

## 2012-06-03 DIAGNOSIS — N390 Urinary tract infection, site not specified: Secondary | ICD-10-CM

## 2012-06-03 DIAGNOSIS — R7989 Other specified abnormal findings of blood chemistry: Secondary | ICD-10-CM | POA: Insufficient documentation

## 2012-06-03 DIAGNOSIS — Z794 Long term (current) use of insulin: Secondary | ICD-10-CM | POA: Insufficient documentation

## 2012-06-03 LAB — URINALYSIS, ROUTINE W REFLEX MICROSCOPIC
Bilirubin Urine: NEGATIVE
Glucose, UA: NEGATIVE mg/dL
Ketones, ur: NEGATIVE mg/dL
Nitrite: NEGATIVE
Protein, ur: 30 mg/dL — AB
Specific Gravity, Urine: 1.016 (ref 1.005–1.030)
Urobilinogen, UA: 0.2 mg/dL (ref 0.0–1.0)
pH: 5 (ref 5.0–8.0)

## 2012-06-03 LAB — COMPREHENSIVE METABOLIC PANEL
ALT: 88 U/L — ABNORMAL HIGH (ref 0–53)
AST: 190 U/L — ABNORMAL HIGH (ref 0–37)
AST: 192 U/L — ABNORMAL HIGH (ref 0–37)
Albumin: 2.4 g/dL — ABNORMAL LOW (ref 3.5–5.2)
Albumin: 2.2 g/dL — ABNORMAL LOW (ref 3.5–5.2)
CO2: 30 mEq/L (ref 19–32)
Calcium: 9.1 mg/dL (ref 8.4–10.5)
Chloride: 112 mEq/L (ref 96–112)
Creatinine, Ser: 2.16 mg/dL — ABNORMAL HIGH (ref 0.50–1.35)
Potassium: 4 mEq/L (ref 3.5–5.1)
Sodium: 153 mEq/L — ABNORMAL HIGH (ref 135–145)
Total Bilirubin: 0.5 mg/dL (ref 0.3–1.2)
Total Protein: 8.1 g/dL (ref 6.0–8.3)
Total Protein: 7.2 g/dL (ref 6.0–8.3)

## 2012-06-03 LAB — COMPREHENSIVE METABOLIC PANEL WITH GFR
ALT: 93 U/L — ABNORMAL HIGH (ref 0–53)
Alkaline Phosphatase: 414 U/L — ABNORMAL HIGH (ref 39–117)
BUN: 35 mg/dL — ABNORMAL HIGH (ref 6–23)
CO2: 26 meq/L (ref 19–32)
Calcium: 9 mg/dL (ref 8.4–10.5)
GFR calc Af Amer: 35 mL/min — ABNORMAL LOW (ref >90)
GFR calc non Af Amer: 30 mL/min — ABNORMAL LOW (ref >90)
Glucose, Bld: 182 mg/dL — ABNORMAL HIGH (ref 70–99)
Sodium: 150 meq/L — ABNORMAL HIGH (ref 135–145)

## 2012-06-03 LAB — URINE MICROSCOPIC-ADD ON

## 2012-06-03 LAB — CG4 I-STAT (LACTIC ACID): Lactic Acid, Venous: 2.9 mmol/L — ABNORMAL HIGH (ref 0.5–2.2)

## 2012-06-03 LAB — GLUCOSE, CAPILLARY: Glucose-Capillary: 173 mg/dL — ABNORMAL HIGH (ref 70–99)

## 2012-06-03 LAB — CBC WITH DIFFERENTIAL/PLATELET
Basophils Absolute: 0.1 K/uL (ref 0.0–0.1)
Basophils Relative: 1 % (ref 0–1)
Eosinophils Absolute: 1.9 K/uL — ABNORMAL HIGH (ref 0.0–0.7)
Eosinophils Absolute: 1.1 10*3/uL — ABNORMAL HIGH (ref 0.0–0.7)
Eosinophils Relative: 16 % — ABNORMAL HIGH (ref 0–5)
Eosinophils Relative: 13 % — ABNORMAL HIGH (ref 0–5)
HCT: 37.9 % — ABNORMAL LOW (ref 39.0–52.0)
HCT: 33.9 % — ABNORMAL LOW (ref 39.0–52.0)
Hemoglobin: 11.4 g/dL — ABNORMAL LOW (ref 13.0–17.0)
Hemoglobin: 10.3 g/dL — ABNORMAL LOW (ref 13.0–17.0)
Lymphocytes Relative: 11 % — ABNORMAL LOW (ref 12–46)
Lymphocytes Relative: 13 % (ref 12–46)
Lymphs Abs: 1.3 10*3/uL (ref 0.7–4.0)
Lymphs Abs: 1.1 10*3/uL (ref 0.7–4.0)
MCH: 27 pg (ref 26.0–34.0)
MCH: 26.9 pg (ref 26.0–34.0)
MCHC: 30.1 g/dL (ref 30.0–36.0)
MCV: 89.6 fL (ref 78.0–100.0)
MCV: 88.5 fL (ref 78.0–100.0)
Monocytes Absolute: 0.4 K/uL (ref 0.1–1.0)
Monocytes Relative: 3 % (ref 3–12)
Monocytes Relative: 5 % (ref 3–12)
Neutro Abs: 8.1 10*3/uL — ABNORMAL HIGH (ref 1.7–7.7)
Neutrophils Relative %: 69 % (ref 43–77)
Platelets: 230 K/uL (ref 150–400)
RBC: 4.23 MIL/uL (ref 4.22–5.81)
RBC: 3.83 MIL/uL — ABNORMAL LOW (ref 4.22–5.81)
RDW: 14.7 % (ref 11.5–15.5)
WBC: 11.7 10*3/uL — ABNORMAL HIGH (ref 4.0–10.5)

## 2012-06-03 LAB — LIPASE, BLOOD: Lipase: 9 U/L — ABNORMAL LOW (ref 11–59)

## 2012-06-03 LAB — PRO B NATRIURETIC PEPTIDE: Pro B Natriuretic peptide (BNP): 8122 pg/mL — ABNORMAL HIGH (ref 0–125)

## 2012-06-03 LAB — TROPONIN I: Troponin I: <0.3 ng/mL (ref <0.30)

## 2012-06-03 MED ORDER — TRIAMCINOLONE ACETONIDE 0.5 % EX CREA
1.0000 "application " | TOPICAL_CREAM | Freq: Two times a day (BID) | CUTANEOUS | Status: DC
  Administered 2012-06-04 – 2012-06-06 (×6): 1 via TOPICAL
  Filled 2012-06-03 (×5): qty 15

## 2012-06-03 MED ORDER — DOCUSATE SODIUM 100 MG PO CAPS
100.0000 mg | ORAL_CAPSULE | Freq: Two times a day (BID) | ORAL | Status: DC
  Administered 2012-06-04 – 2012-06-06 (×3): 100 mg via ORAL
  Filled 2012-06-03 (×7): qty 1

## 2012-06-03 MED ORDER — INSULIN GLARGINE 100 UNIT/ML ~~LOC~~ SOLN
10.0000 [IU] | Freq: Two times a day (BID) | SUBCUTANEOUS | Status: DC
  Administered 2012-06-04 (×3): 10 [IU] via SUBCUTANEOUS

## 2012-06-03 MED ORDER — CIPROFLOXACIN IN D5W 400 MG/200ML IV SOLN
400.0000 mg | Freq: Once | INTRAVENOUS | Status: AC
  Administered 2012-06-03: 400 mg via INTRAVENOUS
  Filled 2012-06-03: qty 200

## 2012-06-03 MED ORDER — CARVEDILOL 12.5 MG PO TABS
12.5000 mg | ORAL_TABLET | Freq: Two times a day (BID) | ORAL | Status: DC
  Administered 2012-06-04 – 2012-06-06 (×5): 12.5 mg via ORAL
  Filled 2012-06-03 (×7): qty 1

## 2012-06-03 MED ORDER — HEPARIN SODIUM (PORCINE) 5000 UNIT/ML IJ SOLN
5000.0000 [IU] | Freq: Three times a day (TID) | INTRAMUSCULAR | Status: DC
  Administered 2012-06-04 – 2012-06-06 (×9): 5000 [IU] via SUBCUTANEOUS
  Filled 2012-06-03 (×11): qty 1

## 2012-06-03 MED ORDER — SODIUM CHLORIDE 0.9 % IV BOLUS (SEPSIS)
500.0000 mL | Freq: Once | INTRAVENOUS | Status: AC
  Administered 2012-06-03: 500 mL via INTRAVENOUS

## 2012-06-03 MED ORDER — SODIUM CHLORIDE 0.9 % IV BOLUS (SEPSIS): 500.0000 mL | Freq: Once | INTRAVENOUS | Status: DC

## 2012-06-03 MED ORDER — PANTOPRAZOLE SODIUM 40 MG PO TBEC
40.0000 mg | DELAYED_RELEASE_TABLET | Freq: Every day | ORAL | Status: DC
  Administered 2012-06-04 – 2012-06-06 (×3): 40 mg via ORAL
  Filled 2012-06-03 (×3): qty 1

## 2012-06-03 MED ORDER — TAMSULOSIN HCL 0.4 MG PO CAPS
0.4000 mg | ORAL_CAPSULE | Freq: Every day | ORAL | Status: DC
  Administered 2012-06-04 – 2012-06-06 (×3): 0.4 mg via ORAL
  Filled 2012-06-03 (×3): qty 1

## 2012-06-03 MED ORDER — SODIUM CHLORIDE 0.45 % IV SOLN
INTRAVENOUS | Status: DC
  Administered 2012-06-04: via INTRAVENOUS
  Administered 2012-06-04: 1000 mL via INTRAVENOUS
  Administered 2012-06-05: 10 mL/h via INTRAVENOUS

## 2012-06-03 MED ORDER — ASPIRIN EC 81 MG PO TBEC
81.0000 mg | DELAYED_RELEASE_TABLET | Freq: Every day | ORAL | Status: DC
  Administered 2012-06-04 – 2012-06-06 (×3): 81 mg via ORAL
  Filled 2012-06-03 (×3): qty 1

## 2012-06-03 MED ORDER — GLUCERNA SHAKE PO LIQD
237.0000 mL | Freq: Three times a day (TID) | ORAL | Status: DC
  Administered 2012-06-03 – 2012-06-06 (×8): 237 mL via ORAL
  Filled 2012-06-03: qty 237

## 2012-06-03 MED ORDER — SODIUM CHLORIDE 0.9 % IJ SOLN
3.0000 mL | Freq: Two times a day (BID) | INTRAMUSCULAR | Status: DC
  Administered 2012-06-04 – 2012-06-06 (×5): 3 mL via INTRAVENOUS

## 2012-06-03 MED ORDER — ASPIRIN EC 81 MG PO TBEC: 81.0000 mg | DELAYED_RELEASE_TABLET | Freq: Every day | ORAL | Status: DC

## 2012-06-03 MED ORDER — INSULIN ASPART 100 UNIT/ML ~~LOC~~ SOLN
0.0000 [IU] | Freq: Three times a day (TID) | SUBCUTANEOUS | Status: DC
  Administered 2012-06-04 (×2): 3 [IU] via SUBCUTANEOUS
  Administered 2012-06-04: 2 [IU] via SUBCUTANEOUS
  Administered 2012-06-05 (×2): 5 [IU] via SUBCUTANEOUS
  Administered 2012-06-05: 18:00:00 via SUBCUTANEOUS
  Administered 2012-06-06: 7 [IU] via SUBCUTANEOUS
  Administered 2012-06-06: 5 [IU] via SUBCUTANEOUS

## 2012-06-03 MED ORDER — CIPROFLOXACIN IN D5W 200 MG/100ML IV SOLN
200.0000 mg | Freq: Two times a day (BID) | INTRAVENOUS | Status: DC
  Administered 2012-06-04 – 2012-06-06 (×5): 200 mg via INTRAVENOUS
  Filled 2012-06-03 (×7): qty 100

## 2012-06-03 NOTE — ED Notes (Signed)
Report given to floor nurse, Clydie Braun, RN. Nurse has no further questions upon report given. Pt being prepared for transfer to floor.

## 2012-06-03 NOTE — ED Notes (Signed)
Upon standing patients gait became unbalanced and unsteady. Patient stated, "I don't feel good. Something don't feel right."

## 2012-06-03 NOTE — ED Notes (Signed)
Pt moving to CDU to wait for admission. Report given to CDU RN.

## 2012-06-03 NOTE — ED Notes (Signed)
Meal tray arrived and ate bedside; pt given feeding supplement.

## 2012-06-03 NOTE — ED Notes (Signed)
Report taken from Eleonore Chiquito., RN

## 2012-06-03 NOTE — ED Notes (Signed)
Pt denies nausea currently. Pt denies pain. Pt denies dizziness and lightheadedness. Pt mentating appropriately.

## 2012-06-03 NOTE — ED Provider Notes (Signed)
History     CSN: [id number]  Arrival date & time 06/03/12  4098   First MD Initiated Contact with Patient 06/03/12 1000      Chief Complaint  Patient presents with  . Loss of Consciousness  . Weakness    (Consider location/radiation/quality/duration/timing/severity/associated sxs/prior treatment) HPI Comments: Patient arrives from home by EMS after a witnessed syncopal episode. He had been complaining of generalized weakness that was quite severe upon waking up. The patient reports that he was admitted to the hospital about a week or 2 ago. Old records reveal that the patient had a urinary tract infection and fever for which she was admitted. He reports he is in the hospital for about 2 days. He reports he feels about the same today as he did he was discharged. He denies headache, chest pain, abdominal pain, back pain. He recalls feeling very weak. He knows that he did have one episode of nausea and vomiting and thinks he passed out at that point. He continues to endorse feeling globally fatigued and weak. He has significant history of diabetes, hypertension, congestive heart failure. He denies any blood in are  Patient is a 66 y.o. male presenting with syncope and weakness. The history is provided by the patient, medical records and the EMS personnel.  Loss of Consciousness Pertinent negatives include no chest pain, no abdominal pain and no shortness of breath.  Weakness The primary symptoms include syncope, dizziness, nausea and vomiting. Primary symptoms do not include fever.  Before the onset of the syncopal episode there was dizziness, weakness and nausea. The syncopal episode did not occur with shortness of breath.  Dizziness also occurs with nausea, vomiting and weakness.  Additional symptoms include weakness.    Past Medical History  Diagnosis Date  . Diabetes mellitus   . Hypertension   . Hyperlipidemia   . CHF (congestive heart failure)   . Enlarged heart   . Pneumonia       hx of     Past Surgical History  Procedure Date  . Right leg fracture   . Skin graft   . Knee surgery   . Tee without cardioversion 03/17/2012    Procedure: TRANSESOPHAGEAL ECHOCARDIOGRAM (TEE);  Surgeon: Ricki Rodriguez, MD;  Location: [facility] ENDOSCOPY;  Service: Cardiovascular;  Laterality: N/A;    No family history on file.  History  Substance Use Topics  . Smoking status: Former Smoker    Quit date: 03/10/1984  . Smokeless tobacco: Never Used  . Alcohol Use: No      Review of Systems  Constitutional: Positive for fatigue. Negative for fever.  Respiratory: Negative for cough and shortness of breath.   Cardiovascular: Positive for syncope. Negative for chest pain.  Gastrointestinal: Positive for nausea and vomiting. Negative for abdominal pain and diarrhea.  Musculoskeletal: Negative for back pain.  Neurological: Positive for dizziness, syncope, weakness and light-headedness.  All other systems reviewed and are negative.    Allergies  Ancef  Home Medications   Current Outpatient Rx  Name  Route  Sig  Dispense  Refill  . ASPIRIN 81 MG PO TBEC   Oral   Take 1 tablet (81 mg total) by mouth daily.   30 tablet   3   . CARVEDILOL 25 MG PO TABS   Oral   Take 0.5 tablets (12.5 mg total) by mouth 2 (two) times daily with a meal.   60 tablet   3   . VITAMIN D 2000 UNITS PO CAPS  Oral   Take 1 capsule by mouth daily.         Marland Kitchen CIPROFLOXACIN HCL 250 MG PO TABS   Oral   Take 1 tablet (250 mg total) by mouth 2 (two) times daily.   14 tablet   0   . DIPHENHYDRAMINE HCL 25 MG PO CAPS   Oral   Take 25 mg by mouth every 6 (six) hours as needed. For allergies         . FUROSEMIDE 80 MG PO TABS   Oral   Take 1 tablet (80 mg total) by mouth 2 (two) times daily.   60 tablet   3   . INSULIN GLARGINE 100 UNIT/ML [place] SOLN   Subcutaneous   Inject 10 Units into the skin 2 (two) times daily.   10 mL   5   . ISOSORB DINITRATE-HYDRALAZINE 20-37.5 MG PO TABS    Oral   Take 1 tablet by mouth 2 (two) times daily.   60 tablet   3   . TAMSULOSIN HCL 0.4 MG PO CAPS   Oral   Take 1 capsule (0.4 mg total) by mouth daily.   30 capsule   3   . TRIAMCINOLONE ACETONIDE 0.5 % EX CREA   Topical   Apply 1 application topically 2 (two) times daily.           BP 118/41  Pulse 81  Temp 97.3 F (36.3 C) (Rectal)  Resp 16  SpO2 97%  Physical Exam  Nursing note and vitals reviewed. Constitutional: He appears well-developed and well-nourished.  HENT:  Head: Normocephalic and atraumatic.  Eyes: No scleral icterus.  Neck: Normal range of motion. Neck supple.  Cardiovascular: Normal rate and regular rhythm.   No murmur heard. Pulmonary/Chest: Effort normal. No respiratory distress. He has no wheezes.  Abdominal: Soft. He exhibits no distension. There is no tenderness.  Musculoskeletal: He exhibits edema.  Neurological: He is alert. He displays normal reflexes.  Skin: Skin is warm.    ED Course  Procedures (including critical care time)  Labs Reviewed  GLUCOSE, CAPILLARY - Abnormal; Notable for the following:    Glucose-Capillary 173 (*)     All other components within normal limits  CBC WITH DIFFERENTIAL - Abnormal; Notable for the following:    WBC 11.7 (*)     Hemoglobin 11.4 (*)     HCT 37.9 (*)     Neutro Abs 8.1 (*)     Lymphocytes Relative 11 (*)     Eosinophils Relative 16 (*)     Eosinophils Absolute 1.9 (*)     All other components within normal limits  PRO B NATRIURETIC PEPTIDE - Abnormal; Notable for the following:    Pro B Natriuretic peptide (BNP) 8122.0 (*)     All other components within normal limits  URINALYSIS, ROUTINE W REFLEX MICROSCOPIC - Abnormal; Notable for the following:    APPearance TURBID (*)     Hgb urine dipstick SMALL (*)     Protein, ur 30 (*)     Leukocytes, UA LARGE (*)     All other components within normal limits  COMPREHENSIVE METABOLIC PANEL - Abnormal; Notable for the following:    Sodium  150 (*)     Glucose, Bld 182 (*)     BUN 35 (*)     Creatinine, Ser 2.16 (*)     Albumin 2.4 (*)     AST 190 (*)     ALT 93 (*)  Alkaline Phosphatase 414 (*)     GFR calc non Af Amer 30 (*)     GFR calc Af Amer 35 (*)     All other components within normal limits  LIPASE, BLOOD - Abnormal; Notable for the following:    Lipase 9 (*)     All other components within normal limits  URINE MICROSCOPIC-ADD ON - Abnormal; Notable for the following:    Bacteria, UA MANY (*)     All other components within normal limits  TROPONIN I  URINE CULTURE   Dg Chest 2 View  06/03/2012  *RADIOLOGY REPORT*  Clinical Data: Cough.  Syncope.  CHEST - 2 VIEW  Comparison: 05/25/2012  Findings: Right lower paratracheal density is stable and probably from vascular structures, although paratracheal adenopathy is not excluded.  Heart size in the upper normal range.  Mildly low lung volumes noted.  Blunting of both posterior costophrenic angles suggest small bilateral pleural effusions.  Linear subsegmental atelectasis noted along the right hemidiaphragm.  IMPRESSION:  1.  Small bilateral pleural effusions. 2.  Mild right basilar subsegmental atelectasis. 3.  Right paratracheal density is likely vascular although paratracheal adenopathy is difficult to exclude.   Original Report Authenticated By: Gaylyn Rong, M.D.      1. Urinary tract infection   2. Syncope   3. Weakness     Room air saturation is 94% and I interpret to be adequate.  EKG performed at time 10:24, shows sinus rhythm at a rate of 92, left atrial enlargement, nonspecific ST and T-wave abnormality in the lateral leads. Borderline prolonged QT interval at a QTC of 505 ms. This EKG is abnormal but similar in appearance to that from 05/25/2012.  2:52 PM Pt is orthostatic, BP is improved after IVF's.  I spoke to Dr. Sharyn Lull and I feel pt needs at least brief admission to continue to hydrate with IVF's, restart IV abx to ensure that pt doesn't  either become septic or renal function and hypotensive episodes improve.    MDM   Generalized fatigue with borderline low blood pressures here. He is afebrile. Electrolytes suggest the patient is hypernatremic, likely from some dehydration. Does not appear to be in DKA. The patient did have a syncopal event at home. Given hypotension, syncope, age and risk factors, I feel admission is likely warranted. It seems from the patient's admission last week, he has not fully recovered. He may benefit from a brief hospitalization at a rehabilitation facility resident at home. This can be better delineated during his inpatient evaluation.        Gavin Pound. Irvine Glorioso, MD 06/03/12 1453

## 2012-06-03 NOTE — ED Notes (Signed)
Pt denies pain; pt denies nausea; pt denies dizziness and lightheadedness; pt denies numbness and tingling; pt mentating appropriately.

## 2012-06-03 NOTE — H&P (Signed)
[name] is an 67 y.o. male.   Chief Complaint: Generalized weakness/poor appetite/questionable syncope HPI: Patient is 66 year old male with past medical history significant for multiple medical problems i.e. 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 Patient refused for rehabilitation and wanted to go home and was discharged home approximately one week ago came back today by EMS as patient was noted to be hypotensive and orthostatic with generalized weakness and questionable syncopal episode. Patient denies any palpitations prior to syncopal episode. Patient denies any chest pain nausea vomiting. Denies any weakness in the arms or legs. Denies any seizure activity. States overall feels generalized weakness and no energy. Patient denies any urinary complaints but was noted to have urinary tract infection.  Past Medical History  Diagnosis Date  . Diabetes mellitus   . Hypertension   . Hyperlipidemia   . CHF (congestive heart failure)   . Enlarged heart   . Pneumonia     hx of     Past Surgical History  Procedure Date  . Right leg fracture   . Skin graft   . Knee surgery   . Tee without cardioversion 03/17/2012    Procedure: TRANSESOPHAGEAL ECHOCARDIOGRAM (TEE);  Surgeon: Ricki Rodriguez, MD;  Location: [facility] ENDOSCOPY;  Service: Cardiovascular;  Laterality: N/A;    No family history on file. Social History:  reports that he quit smoking about 28 years ago. He has never used smokeless tobacco. He reports that he does not drink alcohol or use illicit drugs.  Allergies:    Allergies  Allergen Reactions  . Ancef (Cefazolin)     rash     (Not in a hospital admission)  Results for orders placed during the hospital encounter of 06/03/12 (from the past 48 hour(s))  GLUCOSE, CAPILLARY     Status: Abnormal   Collection Time   06/03/12 10:08 AM      Component Value Range Comment   Glucose-Capillary 173 (*) 70 - 99 mg/dL   CBC WITH DIFFERENTIAL     Status: Abnormal   Collection Time   06/03/12 10:24 AM      Component Value Range Comment   WBC 11.7 (*) 4.0 - 10.5 K/uL    RBC 4.23  4.22 - 5.81 MIL/uL    Hemoglobin 11.4 (*) 13.0 - 17.0 g/dL    HCT 16.1 (*) 09.6 - 52.0 %    MCV 89.6  78.0 - 100.0 fL    MCH 27.0  26.0 - 34.0 pg    MCHC 30.1  30.0 - 36.0 g/dL    RDW 04.5  40.9 - 81.1 %    Platelets 230  150 - 400 K/uL    Neutrophils Relative 69  43 - 77 %    Neutro Abs 8.1 (*) 1.7 - 7.7 K/uL    Lymphocytes Relative 11 (*) 12 - 46 %    Lymphs Abs 1.3  0.7 - 4.0 K/uL    Monocytes Relative 3  3 - 12 %    Monocytes Absolute 0.4  0.1 - 1.0 K/uL    Eosinophils Relative 16 (*) 0 - 5 %    Eosinophils Absolute 1.9 (*) 0.0 - 0.7 K/uL    Basophils  Relative 1  0 - 1 %    Basophils Absolute 0.1  0.0 - 0.1 K/uL   COMPREHENSIVE METABOLIC PANEL     Status: Abnormal   Collection Time   06/03/12 10:24 AM      Component Value Range Comment   Sodium 150 (*) 135 - 145 mEq/L    Potassium 4.0  3.5 - 5.1 mEq/L    Chloride 112  96 - 112 mEq/L    CO2 26  19 - 32 mEq/L    Glucose, Bld 182 (*) 70 - 99 mg/dL    BUN 35 (*) 6 - 23 mg/dL    Creatinine, Ser 4.78 (*) 0.50 - 1.35 mg/dL    Calcium 9.0  8.4 - 29.5 mg/dL    Total Protein 8.1  6.0 - 8.3 g/dL    Albumin 2.4 (*) 3.5 - 5.2 g/dL    AST 621 (*) 0 - 37 U/L    ALT 93 (*) 0 - 53 U/L    Alkaline Phosphatase 414 (*) 39 - 117 U/L    Total Bilirubin 0.5  0.3 - 1.2 mg/dL    GFR calc non Af Amer 30 (*) >90 mL/min    GFR calc Af Amer 35 (*) >90 mL/min   LIPASE, BLOOD     Status: Abnormal   Collection Time   06/03/12 10:24 AM       Component Value Range Comment   Lipase 9 (*) 11 - 59 U/L   TROPONIN I     Status: Normal   Collection Time   06/03/12 10:31 AM      Component Value Range Comment   Troponin I <0.30  <0.30 ng/mL   PRO B NATRIURETIC PEPTIDE     Status: Abnormal   Collection Time   06/03/12 10:31 AM      Component Value Range Comment   Pro B Natriuretic peptide (BNP) 8122.0 (*) 0 - 125 pg/mL   URINALYSIS, ROUTINE W REFLEX MICROSCOPIC     Status: Abnormal   Collection Time   06/03/12 12:47 PM      Component Value Range Comment   Color, Urine YELLOW  YELLOW    APPearance TURBID (*) CLEAR    Specific Gravity, Urine 1.016  1.005 - 1.030    pH 5.0  5.0 - 8.0    Glucose, UA NEGATIVE  NEGATIVE mg/dL    Hgb urine dipstick SMALL (*) NEGATIVE    Bilirubin Urine NEGATIVE  NEGATIVE    Ketones, ur NEGATIVE  NEGATIVE mg/dL    Protein, ur 30 (*) NEGATIVE mg/dL    Urobilinogen, UA 0.2  0.0 - 1.0 mg/dL    Nitrite NEGATIVE  NEGATIVE    Leukocytes, UA LARGE (*) NEGATIVE   URINE MICROSCOPIC-ADD ON     Status: Abnormal   Collection Time   06/03/12 12:47 PM      Component Value Range Comment   Squamous Epithelial / LPF RARE  RARE    WBC, UA TOO NUMEROUS TO COUNT  <3 WBC/hpf    RBC / HPF 0-2  <3 RBC/hpf    Bacteria, UA MANY (*) RARE    Urine-Other FEW YEAST     CG4 I-STAT (LACTIC ACID)     Status: Abnormal   Collection Time   06/03/12  4:16 PM      Component Value Range Comment   Lactic Acid, Venous 2.90 (*) 0.5 - 2.2 mmol/L    Dg Chest 2 View  06/03/2012  *RADIOLOGY REPORT*  Clinical Data: Cough.  Syncope.  CHEST - 2 VIEW  Comparison: 05/25/2012  Findings: Right lower paratracheal density is stable and probably from vascular structures, although paratracheal adenopathy is not excluded.  Heart size in the upper normal range.  Mildly low lung volumes noted.  Blunting of both posterior costophrenic angles suggest small bilateral pleural effusions.  Linear subsegmental atelectasis noted along the right  hemidiaphragm.  IMPRESSION:  1.  Small bilateral pleural effusions. 2.  Mild right basilar subsegmental atelectasis. 3.  Right paratracheal density is likely vascular although paratracheal adenopathy is difficult to exclude.   Original Report Authenticated By: Gaylyn Rong, M.D.     Review of Systems  Constitutional: Positive for malaise/fatigue. Negative for fever and chills.  Cardiovascular: Negative for chest pain, palpitations, orthopnea and claudication.  Gastrointestinal: Positive for abdominal pain. Negative for nausea and vomiting.  Genitourinary: Negative for dysuria.  Neurological: Positive for dizziness and weakness.    Blood pressure 128/72, pulse 91, temperature 97.9 F (36.6 C), temperature source Rectal, resp. rate 19, SpO2 100.00%. Physical Exam  Constitutional: He is oriented to person, place, and time.  HENT:  Head: Normocephalic and atraumatic.  Mouth/Throat: No oropharyngeal exudate.  Eyes: Conjunctivae normal are normal. Pupils are equal, round, and reactive to light. Left eye exhibits no discharge.  Neck: Normal range of motion. Neck supple. No JVD present. No tracheal deviation present. No thyromegaly present.  Cardiovascular: Normal rate and regular rhythm.   Murmur (Systolic murmur noted) heard. Respiratory:       Decrease breath sound at bases  GI: Soft. Bowel sounds are normal. He exhibits no distension. There is no rebound.  Musculoskeletal: He exhibits no edema and no tenderness.  Neurological: He is alert and oriented to person, place, and time.  Skin:       Exfoliation noted     Assessment/Plan Status post syncope probably secondary to orthostatic hypotension rule out cardiac arrhythmias Hypertension Insulin requiring diabetes mellitus Urosepsis Chronic kidney disease stage IV Morbid obesity History of interstitial nephritis in the recent past History of MSSA bacteremia in the recent past History of exfoliative  dermatitis Hypernatremia Elevated LFTs questionable etiology Deconditioning Plan As per orders OT PT consult Inpatient rehabilitation consult  Robynn Pane 06/03/2012, 5:30 PM

## 2012-06-03 NOTE — ED Notes (Signed)
Per EMS- Pt comes from home had witnessed syncopal episode by PTAR. Pt reporting weakness since this morning. After syncope had episode of vomiting x 1. CBG 146. No acute neuro deficits. Pt is alert and oriented. Moving all extremities. Pt refused IV with EMS. BP 100/70, HR 85 SR.

## 2012-06-03 NOTE — ED Notes (Signed)
Spoke with Dr. Sharyn Lull and notified that pts work was being done for admission

## 2012-06-03 NOTE — Progress Notes (Signed)
Received call from Olegario Messier, RN with [name]. Patient is currently active with them for RN/PT/ and OT. Will need resumption of care orders upon discharge from hospital. Levon Hedger also requested notification of disposition from ED.

## 2012-06-03 NOTE — Progress Notes (Signed)
[name] with [facility]- [phone]

## 2012-06-03 NOTE — ED Notes (Signed)
Meal tray ordered 

## 2012-06-04 LAB — GLUCOSE, CAPILLARY
Glucose-Capillary: 245 mg/dL — ABNORMAL HIGH (ref 70–99)
Glucose-Capillary: 247 mg/dL — ABNORMAL HIGH (ref 70–99)

## 2012-06-04 LAB — URINE CULTURE: Colony Count: 85000

## 2012-06-04 LAB — BASIC METABOLIC PANEL
BUN: 37 mg/dL — ABNORMAL HIGH (ref 6–23)
CO2: 32 mEq/L (ref 19–32)
Calcium: 8.8 mg/dL (ref 8.4–10.5)
Creatinine, Ser: 2.25 mg/dL — ABNORMAL HIGH (ref 0.50–1.35)
GFR calc Af Amer: 33 mL/min — ABNORMAL LOW (ref >90)

## 2012-06-04 LAB — CBC
HCT: 30.6 % — ABNORMAL LOW (ref 39.0–52.0)
MCHC: 29.4 g/dL — ABNORMAL LOW (ref 30.0–36.0)
MCV: 89.5 fL (ref 78.0–100.0)
Platelets: 191 10*3/uL (ref 150–400)
RDW: 14.9 % (ref 11.5–15.5)
WBC: 8 10*3/uL (ref 4.0–10.5)

## 2012-06-04 NOTE — Evaluation (Signed)
Occupational Therapy Evaluation Patient Details Name: [name] MRN: [id number] DOB: 09/26/1945 Today's Date: 06/04/2012 Time: 3086-5784 OT Time Calculation (min): 30 min  OT Assessment / Plan / Recommendation Clinical Impression  Patient is a 66 yo male admitted with syncope, weakness, and UTI. Pt presents as generally weak and will benefit from skilled OT in the acute setting to maximize I with ADL and ADL mobility prior to d/c. Recommend HHOT at d/c.     OT Assessment  Patient needs continued OT Services    Follow Up Recommendations  Home health OT    Barriers to Discharge      Equipment Recommendations  None recommended by OT    Recommendations for Other Services    Frequency  Min 2X/week    Precautions / Restrictions Precautions Precautions: Fall   Pertinent Vitals/Pain Pt with no c/o pain    ADL  Grooming: Wash/dry face;Wash/dry hands;Min guard Where Assessed - Grooming: Supported standing (unilateral support on sink) Upper Body Bathing: Min guard Where Assessed - Upper Body Bathing: Supported standing Lower Body Bathing: Min guard Where Assessed - Lower Body Bathing: Supported standing Upper Body Dressing: Min guard Where Assessed - Upper Body Dressing: Supported sitting Lower Body Dressing: Maximal assistance Where Assessed - Lower Body Dressing: Unsupported sit to stand Toilet Transfer: Supervision/safety Statistician Method: Sit to Barista: Materials engineer and Hygiene: Minimal assistance Where Assessed - Engineer, mining and Hygiene: Sit to stand from 3-in-1 or toilet Equipment Used: Gait belt;Rolling walker Transfers/Ambulation Related to ADLs: min guard A with RW ambulation- cues to keep RW on ground as opposed to lifting it over objects when navigating in tight spaces. Cues to remain within RW especially during turns    OT Diagnosis: Generalized weakness  OT Problem List:  Decreased activity tolerance;Decreased knowledge of use of DME or AE;Increased edema OT Treatment Interventions: Self-care/ADL training;Therapeutic activities;DME and/or AE instruction;Patient/family education   OT Goals Acute Rehab OT Goals OT Goal Formulation: With patient Time For Goal Achievement: 06/11/12 Potential to Achieve Goals: Good ADL Goals Pt Will Perform Grooming: Standing at sink;with modified independence ADL Goal: Grooming - Progress: Goal set today Pt Will Perform Upper Body Dressing: Independently;Sitting, bed;Sitting, chair ADL Goal: Upper Body Dressing - Progress: Goal set today Pt Will Perform Lower Body Dressing: with mod assist;Sit to stand from bed;Sit to stand from chair ADL Goal: Lower Body Dressing - Progress: Goal set today Pt Will Transfer to Toilet: with modified independence;Ambulation;with DME ADL Goal: Toilet Transfer - Progress: Goal set today Pt Will Perform Toileting - Clothing Manipulation: with modified independence;Sitting on 3-in-1 or toilet;Standing ADL Goal: Toileting - Clothing Manipulation - Progress: Goal set today Pt Will Perform Toileting - Hygiene: with modified independence;Sit to stand from 3-in-1/toilet ADL Goal: Toileting - Hygiene - Progress: Goal set today  Visit Information  Last OT Received On: 06/04/12 Assistance Needed: +1    Subjective Data  Subjective: "I won't do a dang thing until you get me my carton of milk!" Patient Stated Goal: Return home   Prior Functioning     Home Living Lives With: Spouse Available Help at Discharge: Family;Available 24 hours/day Type of Home: House Home Access: Stairs to enter [employer] of Steps: 2 Entrance Stairs-Rails: None Home Layout: One level Bathroom Shower/Tub: Health visitor: Standard Bathroom Accessibility: Yes How Accessible: Accessible via walker Home Adaptive Equipment: Bedside commode/3-in-1;Hospital bed;Straight cane;Walker -  rolling;Wheelchair - manual Additional Comments: hospital bed Prior Function Level of  Independence: Needs assistance Needs Assistance: Bathing;Dressing;Meal Prep;Light Housekeeping Bath: Moderate Dressing: Moderate Meal Prep: Total Light Housekeeping: Total Able to Take Stairs?: Yes Driving: Yes Vocation: Retired Musician: No difficulties Dominant Hand: Right         Vision/Perception     Cognition  Overall Cognitive Status: Appears within functional limits for tasks assessed/performed Arousal/Alertness: Awake/alert Orientation Level: Appears intact for tasks assessed Behavior During Session: Flat affect    Extremity/Trunk Assessment Right Upper Extremity Assessment RUE ROM/Strength/Tone: WFL for tasks assessed (poor endurance) Left Upper Extremity Assessment LUE ROM/Strength/Tone: WFL for tasks assessed (poor endurance)     Mobility       Shoulder Instructions     Exercise     Balance     End of Session OT - End of Session Equipment Utilized During Treatment: Gait belt Activity Tolerance: Patient tolerated treatment well Patient left: in chair;with call bell/phone within reach Nurse Communication: Mobility status  GO Functional Assessment Tool Used: Clinical Judgement Functional Limitation: Self care Self Care Current Status (Z6109): At least 40 percent but less than 60 percent impaired, limited or restricted Self Care Goal Status (U0454): At least 20 percent but less than 40 percent impaired, limited or restricted   [name] 06/04/2012, 2:35 PM

## 2012-06-04 NOTE — Evaluation (Signed)
Physical Therapy Evaluation Patient Details Name: [name] MRN: [id number] DOB: 1946/05/25 Today's Date: 06/04/2012 Time: 9629-5284 PT Time Calculation (min): 29 min  PT Assessment / Plan / Recommendation Clinical Impression  Patient is a 66 yo male admitted with syncope, weakness, and UTI.  Able to ambulate 148' with RW and min guard assist.  Patient has general weakness impacting functional mobility.  Will benefit from acute PT to maximize independence prior to return home with wife.  Patient has 24 hour assist.  Recommend HHPT at discharge.    PT Assessment  Patient needs continued PT services    Follow Up Recommendations  Home health PT;Supervision/Assistance - 24 hour    Does the patient have the potential to tolerate intense rehabilitation      Barriers to Discharge None      Equipment Recommendations  None recommended by PT    Recommendations for Other Services     Frequency Min 3X/week    Precautions / Restrictions Precautions Precautions: Fall Restrictions Weight Bearing Restrictions: No   Pertinent Vitals/Pain       Mobility  Bed Mobility Bed Mobility: Supine to Sit;Sitting - Scoot to Edge of Bed Supine to Sit: 5: Supervision;HOB elevated;With rails Sitting - Scoot to Edge of Bed: 5: Supervision;With rail Details for Bed Mobility Assistance: No cues or assist needed. Transfers Transfers: Sit to Stand;Stand to Sit Sit to Stand: 4: Min guard;With upper extremity assist;From bed;With armrests;From chair/3-in-1 Stand to Sit: 4: Min guard;With upper extremity assist;With armrests;To chair/3-in-1 Details for Transfer Assistance: Verbal cues for hand placement and safety.  Assist due to decreased balance initially - improved with time. Ambulation/Gait Ambulation/Gait Assistance: 4: Min guard Ambulation Distance (Feet): 148 Feet Assistive device: Rolling walker Ambulation/Gait Assistance Details: Verbal cues to stand upright and stay close to RW for  safety.  Patient lost balance during first turn - physical assist to regain balance.  Instructed patient to take small steps and turn RW during turns.  No difficulty with next 3 turns. Gait Pattern: Step-through pattern;Decreased stride length;Trunk flexed Gait velocity: Slow gait speed.           PT Diagnosis: Abnormality of gait;Generalized weakness  PT Problem List: Decreased strength;Decreased activity tolerance;Decreased balance;Decreased mobility;Obesity PT Treatment Interventions: DME instruction;Gait training;Functional mobility training;Therapeutic exercise;Patient/family education   PT Goals Acute Rehab PT Goals PT Goal Formulation: With patient Time For Goal Achievement: 06/11/12 Potential to Achieve Goals: Good Pt will go Sit to Stand: with modified independence PT Goal: Sit to Stand - Progress: Goal set today Pt will go Stand to Sit: with modified independence PT Goal: Stand to Sit - Progress: Goal set today Pt will Ambulate: >150 feet;with modified independence;with rolling walker PT Goal: Ambulate - Progress: Goal set today  Visit Information  Last PT Received On: 06/04/12 Assistance Needed: +1    Subjective Data  Subjective: I didn't fall on the floor.  I just passed out at the table. Patient Stated Goal: To return home.   Prior Functioning  Home Living Lives With: Spouse Available Help at Discharge: Family;Available 24 hours/day Type of Home: House Home Access: Stairs to enter [employer] of Steps: 2 Entrance Stairs-Rails: None Home Layout: One level Bathroom Shower/Tub: Health visitor: Standard Bathroom Accessibility: Yes How Accessible: Accessible via walker Home Adaptive Equipment: Bedside commode/3-in-1;Hospital bed;Straight cane;Walker - rolling;Wheelchair - manual Prior Function Level of Independence: Needs assistance Needs Assistance: Bathing;Dressing;Meal Prep;Light Housekeeping Bath: Moderate Dressing:  Moderate Meal Prep: Total Light Housekeeping: Total Able to Take Stairs?: Yes  Driving: Yes Vocation: Retired Musician: No difficulties Dominant Hand: Right    Cognition  Overall Cognitive Status: Appears within functional limits for tasks assessed/performed Arousal/Alertness: Awake/alert Orientation Level: Appears intact for tasks assessed Behavior During Session: Flat affect    Extremity/Trunk Assessment Right Upper Extremity Assessment RUE ROM/Strength/Tone: WFL for tasks assessed RUE Sensation: WFL - Light Touch Left Upper Extremity Assessment LUE ROM/Strength/Tone: WFL for tasks assessed LUE Sensation: WFL - Light Touch Right Lower Extremity Assessment RLE ROM/Strength/Tone: Deficits RLE ROM/Strength/Tone Deficits: Strength 4/5; Edema noted bil. LE's RLE Sensation: WFL - Light Touch Left Lower Extremity Assessment LLE ROM/Strength/Tone: Deficits LLE ROM/Strength/Tone Deficits: Strength 4/5; Edema noted bil. LE's LLE Sensation: WFL - Light Touch   Balance Balance Balance Assessed: Yes Static Sitting Balance Static Sitting - Balance Support: No upper extremity supported;Feet supported Static Sitting - Level of Assistance: 7: Independent Static Sitting - Comment/# of Minutes: Patient able to sit EOB x 6 minutes with good balance.  End of Session PT - End of Session Equipment Utilized During Treatment: Gait belt Activity Tolerance: Patient limited by fatigue Patient left: in chair;with call bell/phone within reach Nurse Communication: Mobility status  GP Functional Assessment Tool Used: clinical judgement Functional Limitation: Mobility: Walking and moving around Mobility: Walking and Moving Around Current Status (Z6109): At least 20 percent but less than 40 percent impaired, limited or restricted Mobility: Walking and Moving Around Goal Status [phone]): At least 1 percent but less than 20 percent impaired, limited or restricted   Vena Austria 06/04/2012, 10:12 AM Durenda Hurt. Renaldo Fiddler, [facility] Acute Rehab Services Pager [phone]

## 2012-06-04 NOTE — Progress Notes (Signed)
Subjective:  Patient denies any chest pain or shortness of breath states feels better denies urinary complaints  Objective:  Vital Signs in the last 24 hours: Temp:  [97.3 F (36.3 C)-99.3 F (37.4 C)] 98.1 F (36.7 C) (12/28 0621) Pulse Rate:  [80-105] 105  (12/28 0635) Resp:  [13-22] 18  (12/28 0621) BP: (100-171)/(39-78) 108/68 mmHg (12/28 0635) SpO2:  [94 %-100 %] 100 % (12/28 0621) Weight:  [104.9 kg (231 lb 4.2 oz)-104.917 kg (231 lb 4.8 oz)] 104.9 kg (231 lb 4.2 oz) (12/28 4098)  Intake/Output from previous day: 12/27 0701 - 12/28 0700 In: 480 [P.O.:480] Out: 200 [Urine:200] Intake/Output from this shift: Total I/O In: 720 [P.O.:720] Out: -   Physical Exam: Neck: no adenopathy, no carotid bruit, no JVD and supple, symmetrical, trachea midline Lungs: Decreased breath sound at bases Heart: regular rate and rhythm, S1, S2 normal and Soft systolic murmur noted Abdomen: soft, non-tender; bowel sounds normal; no masses,  no organomegaly Extremities: No clubbing cyanosis and 1+ edema noted  Lab Results:  [facility] 06/04/12 0550 06/03/12 2237  WBC 8.0 8.7  HGB 9.0* 10.3*  PLT 191 196    Basename 06/04/12 0550 06/03/12 2237  NA 150* 153*  K 4.5 4.0  CL 110 113*  CO2 32 30  GLUCOSE 266* 205*  BUN 37* 36*  CREATININE 2.25* 2.20*    Basename 06/03/12 1031  TROPONINI <0.30   Hepatic Function Panel  Basename 06/03/12 2237  PROT 7.2  ALBUMIN 2.2*  AST 192*  ALT 88*  ALKPHOS 383*  BILITOT 0.3  BILIDIR --  IBILI --   No results found for this basename: CHOL in the last 72 hours No results found for this basename: PROTIME in the last 72 hours  Imaging: Imaging results have been reviewed and Dg Chest 2 View  06/03/2012  *RADIOLOGY REPORT*  Clinical Data: Cough.  Syncope.  CHEST - 2 VIEW  Comparison: 05/25/2012  Findings: Right lower paratracheal density is stable and probably from vascular structures, although paratracheal adenopathy is not excluded.  Heart  size in the upper normal range.  Mildly low lung volumes noted.  Blunting of both posterior costophrenic angles suggest small bilateral pleural effusions.  Linear subsegmental atelectasis noted along the right hemidiaphragm.  IMPRESSION:  1.  Small bilateral pleural effusions. 2.  Mild right basilar subsegmental atelectasis. 3.  Right paratracheal density is likely vascular although paratracheal adenopathy is difficult to exclude.   Original Report Authenticated By: Gaylyn Rong, M.D.     Cardiac Studies:  Assessment/Plan:  Status post syncope probably secondary to orthostatic hypotension rule out cardiac arrhythmias  Hypertension  Insulin requiring diabetes mellitus  Urosepsis  Chronic kidney disease stage IV  Morbid obesity  History of interstitial nephritis in the recent past  History of MSSA bacteremia in the recent past  History of exfoliative dermatitis  Hypernatremia  Elevated LFTs questionable etiology  Deconditioning Plan Increase ambulation Decrease IV fluid to KVO TED stockings  LOS: 1 day    [name] 06/04/2012, 9:29 AM

## 2012-06-05 LAB — GLUCOSE, CAPILLARY: Glucose-Capillary: 312 mg/dL — ABNORMAL HIGH (ref 70–99)

## 2012-06-05 LAB — URINE CULTURE

## 2012-06-05 MED ORDER — INSULIN GLARGINE 100 UNIT/ML ~~LOC~~ SOLN
12.0000 [IU] | Freq: Two times a day (BID) | SUBCUTANEOUS | Status: DC
  Administered 2012-06-05 – 2012-06-06 (×3): 12 [IU] via SUBCUTANEOUS

## 2012-06-05 NOTE — Progress Notes (Signed)
Subjective:  Patient denies any chest pain or shortness of breath states overall feels little better. Discussed with patient regarding rehabilitation and skilled nursing facility states wants to go home when stable  Objective:  Vital Signs in the last 24 hours: Temp:  [97.9 F (36.6 C)-98.1 F (36.7 C)] 97.9 F (36.6 C) (12/29 0406) Pulse Rate:  [80-88] 80  (12/29 0406) Resp:  [20-22] 20  (12/29 0406) BP: (130-162)/(64-76) 152/76 mmHg (12/29 0406) SpO2:  [100 %] 100 % (12/29 0406) Weight:  [102.9 kg (226 lb 13.7 oz)] 102.9 kg (226 lb 13.7 oz) (12/29 0406)  Intake/Output from previous day: 12/28 0701 - 12/29 0700 In: 1320 [P.O.:1320] Out: 375 [Urine:375] Intake/Output from this shift: Total I/O In: 480 [P.O.:480] Out: -   Physical Exam: Neck: no adenopathy, no carotid bruit, no JVD and supple, symmetrical, trachea midline Lungs: Decreased breath sound at bases Heart: regular rate and rhythm, S1, S2 normal and Soft systolic murmur noted Abdomen: soft, non-tender; bowel sounds normal; no masses,  no organomegaly Extremities: No clubbing cyanosis 1+ edema noted  Lab Results:  [facility] 06/04/12 0550 06/03/12 2237  WBC 8.0 8.7  HGB 9.0* 10.3*  PLT 191 196    Basename 06/04/12 0550 06/03/12 2237  NA 150* 153*  K 4.5 4.0  CL 110 113*  CO2 32 30  GLUCOSE 266* 205*  BUN 37* 36*  CREATININE 2.25* 2.20*    Basename 06/03/12 1031  TROPONINI <0.30   Hepatic Function Panel  Basename 06/03/12 2237  PROT 7.2  ALBUMIN 2.2*  AST 192*  ALT 88*  ALKPHOS 383*  BILITOT 0.3  BILIDIR --  IBILI --   No results found for this basename: CHOL in the last 72 hours No results found for this basename: PROTIME in the last 72 hours  Imaging: Imaging results have been reviewed and Dg Chest 2 View  06/03/2012  *RADIOLOGY REPORT*  Clinical Data: Cough.  Syncope.  CHEST - 2 VIEW  Comparison: 05/25/2012  Findings: Right lower paratracheal density is stable and probably from vascular  structures, although paratracheal adenopathy is not excluded.  Heart size in the upper normal range.  Mildly low lung volumes noted.  Blunting of both posterior costophrenic angles suggest small bilateral pleural effusions.  Linear subsegmental atelectasis noted along the right hemidiaphragm.  IMPRESSION:  1.  Small bilateral pleural effusions. 2.  Mild right basilar subsegmental atelectasis. 3.  Right paratracheal density is likely vascular although paratracheal adenopathy is difficult to exclude.   Original Report Authenticated By: Gaylyn Rong, M.D.     Cardiac Studies:  Assessment/Plan:  Status post syncope probably secondary to orthostatic hypotension rule out cardiac arrhythmias  Hypertension  Insulin requiring diabetes mellitus  Urosepsis  Chronic kidney disease stage IV  Morbid obesity  History of interstitial nephritis in the recent past  History of MSSA bacteremia in the recent past  History of exfoliative dermatitis  Hypernatremia  Elevated LFTs questionable etiology  Deconditioning Plan Check labs in a.m. Increase ambulation  LOS: 2 days    [name] 06/05/2012, 10:19 AM

## 2012-06-05 NOTE — Progress Notes (Signed)
Utilization review completed.  

## 2012-06-06 LAB — CBC
Hemoglobin: 9.5 g/dL — ABNORMAL LOW (ref 13.0–17.0)
MCV: 87.9 fL (ref 78.0–100.0)
Platelets: 195 10*3/uL (ref 150–400)
RBC: 3.54 MIL/uL — ABNORMAL LOW (ref 4.22–5.81)
WBC: 7 10*3/uL (ref 4.0–10.5)

## 2012-06-06 LAB — GLUCOSE, CAPILLARY: Glucose-Capillary: 343 mg/dL — ABNORMAL HIGH (ref 70–99)

## 2012-06-06 LAB — BASIC METABOLIC PANEL
CO2: 29 mEq/L (ref 19–32)
Chloride: 107 mEq/L (ref 96–112)
Creatinine, Ser: 1.9 mg/dL — ABNORMAL HIGH (ref 0.50–1.35)
Sodium: 144 mEq/L (ref 135–145)

## 2012-06-06 MED ORDER — FUROSEMIDE 80 MG PO TABS: 40.0000 mg | ORAL_TABLET | Freq: Two times a day (BID) | ORAL | Status: DC

## 2012-06-06 MED ORDER — CIPROFLOXACIN HCL 250 MG PO TABS
250.0000 mg | ORAL_TABLET | Freq: Two times a day (BID) | ORAL | Status: DC
  Administered 2012-06-06: 250 mg via ORAL
  Filled 2012-06-06 (×3): qty 1

## 2012-06-06 MED ORDER — CIPROFLOXACIN HCL 250 MG PO TABS: 250.0000 mg | ORAL_TABLET | Freq: Two times a day (BID) | ORAL | Status: DC

## 2012-06-06 NOTE — Progress Notes (Signed)
Thank you for consult on [name]. Event leading to hospitalization noted. PT/OT notes reviewed and note that patient did well on evaluation. HHPT/OT recommended past discharge and patient prefers discharge to home. Will defer CIR consult for now.

## 2012-06-06 NOTE — Progress Notes (Signed)
Physical Therapy Treatment Patient Details Name: [name] MRN: [id number] DOB: [birth year] Today's Date: 06/06/2012 Time: 4098-1191 PT Time Calculation (min): 25 min  PT Assessment / Plan / Recommendation Comments on Treatment Session  Pt admitted with UTI, syncope and weakness and demonstrates improved activity progression from eval. Pt agitated with questions regarding home function and environment. Pt at or near baseline and will continue to follow but anticipate pt is at level to return home with spouse.     Follow Up Recommendations        Does the patient have the potential to tolerate intense rehabilitation     Barriers to Discharge        Equipment Recommendations       Recommendations for Other Services    Frequency     Plan Discharge plan remains appropriate;Frequency remains appropriate    Precautions / Restrictions Precautions Precautions: Fall Restrictions Weight Bearing Restrictions: No   Pertinent Vitals/Pain No pain HR 105 with gait    Mobility  Bed Mobility Bed Mobility: Not assessed Details for Bed Mobility Assistance: pt in chair before and after Transfers Sit to Stand: 6: Modified independent (Device/Increase time);From chair/3-in-1 Stand to Sit: 6: Modified independent (Device/Increase time);To chair/3-in-1 Details for Transfer Assistance: pt stopped to sit on rolling stool in hall despite cueing to return last 20' to room and required min assist to maintain stationery stool with transfers. Stood x 2 from chair with armrests in room without difficulty Ambulation/Gait Ambulation/Gait Assistance: 5: Supervision Ambulation Distance (Feet): 350 Feet, pt sat on stool at 330' for grossly 15 sec prior to completion of gait Assistive device: Rolling walker Ambulation/Gait Assistance Details: cueing to step into RW and extend trunk but pt ignored stating he preferred his way of slight flexion and posterior position in RW Gait Pattern: Step-through  pattern;Decreased stride length;Trunk flexed Gait velocity: decreased Stairs: Yes Stairs Assistance: 4: Min assist Stairs Assistance Details (indicate cue type and reason): attempted 8"steps with pt with difficulty ascending one step holding rail in one hand and upper stair in left with assist to ascend and descend. Pt agitated with attempt at steps stating his steps at home are short 4" and he can get up those fine. Stair Management Technique: One rail Right Number of Stairs: 1     Exercises General Exercises - Lower Extremity Long Arc Quad: AROM;Both;15 reps;Seated Hip Flexion/Marching: AROM;Both;15 reps;Seated   PT Diagnosis:    PT Problem List:   PT Treatment Interventions:     PT Goals Acute Rehab PT Goals PT Goal: Sit to Stand - Progress: Met PT Goal: Stand to Sit - Progress: Met PT Goal: Ambulate - Progress: Progressing toward goal PT Goal: Up/Down Stairs - Progress: Progressing toward goal  Visit Information  Last PT Received On: 06/06/12 Assistance Needed: +1    Subjective Data  Subjective: "I have a therapist at home and that's why I don't like any of yall" "you sure ask a lot of damn questions"   Cognition  Overall Cognitive Status: Appears within functional limits for tasks assessed/performed Arousal/Alertness: Awake/alert Orientation Level: Appears intact for tasks assessed Behavior During Session: [facility] for tasks performed    Balance     End of Session PT - End of Session Equipment Utilized During Treatment: Gait belt Activity Tolerance: Patient tolerated treatment well Patient left: in chair;with call bell/phone within reach Nurse Communication: Mobility status   GP     [name] 06/06/2012, 10:53 AM Delaney Meigs, PT [phone]

## 2012-06-06 NOTE — Discharge Summary (Signed)
  Discharge summary dictated on 06/06/2012 dictation [phone]

## 2012-06-06 NOTE — Progress Notes (Signed)
[name] to be D/C'd Home per MD order.  Discussed with the patient and all questions fully answered.   [name], [name]  Home Medication Instructions ZOX:[id number]   Printed on:06/06/12 1339  Medication Information                    aspirin EC 81 MG EC tablet Take 1 tablet (81 mg total) by mouth daily.           Tamsulosin HCl (FLOMAX) 0.4 MG CAPS Take 1 capsule (0.4 mg total) by mouth daily.           Cholecalciferol (VITAMIN D) 2000 UNITS CAPS Take 1 capsule by mouth daily.           triamcinolone cream (KENALOG) 0.5 % Apply 1 application topically 2 (two) times daily.           carvedilol (COREG) 25 MG tablet Take 0.5 tablets (12.5 mg total) by mouth 2 (two) times daily with a meal.           insulin glargine (LANTUS) 100 UNIT/ML injection Inject 10 Units into the skin 2 (two) times daily.           ciprofloxacin (CIPRO) 250 MG tablet Take 1 tablet (250 mg total) by mouth 2 (two) times daily.           furosemide (LASIX) 80 MG tablet Take 0.5 tablets (40 mg total) by mouth 2 (two) times daily.             VVS, Skin clean, dry and intact without evidence of skin break down, no evidence of skin tears noted. IV catheter discontinued intact. Site without signs and symptoms of complications. Dressing and pressure applied.  An After Visit Summary was printed and given to the patient. Patient escorted via WC, and D/C home via private auto with family  [name], [name] 06/06/2012 1:39 PM

## 2012-06-07 NOTE — Discharge Summary (Signed)
NAMEAB, LEAMING             ACCOUNT NO.:  [id number]  MEDICAL RECORD NO.:  [id number]  LOCATION:  4742                         FACILITY:  [facility]  PHYSICIAN:  Eduardo Osier. Sharyn Lull, M.D. DATE OF BIRTH:  [birth year]  DATE OF ADMISSION:  06/03/2012 DATE OF DISCHARGE:  06/06/2012                              DISCHARGE SUMMARY   ADMITTING DIAGNOSES: 1. Status post questionable syncope probably secondary to orthostatic     hypotension, rule out cardiac arrhythmias. 2. Hypertension. 3. Insulin-requiring diabetes mellitus. 4. Urosepsis. 5. Chronic kidney disease, stage IV. 6. Morbid obesity. 7. 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 8. History of methicillin-susceptible Staphylococcus aureus bacteremia     in the recent past. 9. History of exfoliative dermatitis. 10.Hypernatremia. 11.Elevated LFT, questionable etiology. 12.Deconditioning.  FINAL DIAGNOSES: 1. Status post questionable syncope probably secondary to orthostatic     hypotension, rule out cardiac arrhythmias. 2. Hypertension. 3. Insulin-requiring diabetes mellitus. 4. Urosepsis. 5. Chronic kidney disease, stage IV. 6. Morbid obesity. 7. 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 8. History of methicillin-susceptible Staphylococcus aureus bacteremia     in the recent past. 9. History of exfoliative dermatitis. 10.Hypernatremia. 11.Elevated LFT, questionable etiology. 12.Deconditioning.  DISCHARGED HOME MEDICATIONS: 1. Lasix 40 mg twice daily. 2. Enteric-coated aspirin 81 mg daily. 3. Carvedilol 12.5 mg twice daily. 4. Ciprofloxacin 250 mg twice daily for 5 more days. 5. Lantus insulin 10 units subcu twice daily. 6. Flomax 0.4 mg daily. 7. Kenalog cream apply locally.  The patient has been advised to stop BiDil and Benadryl for now.  CONDITION AT DISCHARGE:  Stable.  The patient has been advised to monitor blood pressure and blood sugar twice daily and chart.  Heart failure instructions  have been given. Follow up with me in 1 week.  BRIEF HISTORY AND HOSPITAL COURSE:  Mr. Ellinwood is a 66 year old male with past medical history significant for multiple medical problems, i.e. hypertension, insulin-requiring diabetes mellitus, chronic kidney disease stage IV, history of interstitial nephritis, morbid obesity, history of MSSA bacteremia secondary to catheter-related sepsis recently, history of exfoliative dermatitis, history of congestive heart failure secondary to diastolic and systolic heart failure in the past. Recently discharged from the hospital approximately 1 week ago, treated for urinary tract infection.  The patient refused for rehab and wanted to go home and was discharged home approximately 1 week ago.  Came back today by EMS as the patient was noted to be hypotensive and had orthostatic hypotension and generalized weakness and questionable syncopal episode.  The patient denies any palpitation prior to syncopal episodes.  Denies any chest pain, nausea, vomiting.  Denies any weakness in the arms or legs.  Denies any seizure activity.  States overall feels generalized weak and no energy.  The patient denies any urinary complaints, but what noted to have urosepsis.  PAST MEDICAL HISTORY:  As above.  PAST SURGICAL HISTORY:  He had TEE in October of 2013, which showed no evidence of vegetations.  Had knee surgery in the past.  Had skin grafting and right leg fracture in the past.  PHYSICAL EXAMINATION:  GENERAL:  He was alert, awake, and oriented x3. VITAL SIGNS:  Blood pressure was 128/72, pulse was 91,  he was afebrile. HEENT:  Conjunctivae was pink. NECK:  Supple.  No JVD. LUNGS:  Decreased breath sounds at bases. CARDIOVASCULAR:  S1, S2 was normal.  There was soft systolic murmur. ABDOMEN:  Soft.  Bowel sounds were present.  Nontender. EXTREMITIES:  There was no clubbing, cyanosis, or edema. SKIN:  There was exfoliation noted.  LABS:  Sodium was 153,  potassium was 4.0, BUN 36, creatinine 2.20. Repeat electrolytes today; sodium 144, potassium 4.0, BUN 35, creatinine 1.90.  Blood sugars little bit elevated 314.  Hemoglobin is 9.5, hematocrit 31.1, white count of 7.0, which has come down from 11.7. Urine culture grew multiple bacterial morphotypes, none predominant.  BRIEF HOSPITAL COURSE:  The patient was admitted to Telemetry Unit.  The patient did not have any episodes of cardiac arrhythmias during the hospital stay, nor the patient had any episodes of hypotension.  His BiDil was discontinued and Lasix was also held, but was continued on beta-blockers and Cipro p.o. was switched to IV Cipro with improvement in his symptoms.  Discussed at length with the patient regarding inpatient rehab versus skilled nursing facilities as outpatient.  The patient states he wanted to go home.  OT/PT consultation was obtained. The patient is ambulating in the hallway.  The patient will be discharged home on above medications and will be followed up in my office in 1 week.  The patient has been encouraged to follow up with dermatologist as outpatient also.     Eduardo Osier. Sharyn Lull, M.D.     [facility]  D:  06/06/2012  T:  06/07/2012  Job:  [id number]

## 2012-08-25 ENCOUNTER — Other Ambulatory Visit: Payer: Self-pay | Admitting: Dermatology

## 2012-11-26 ENCOUNTER — Emergency Department (HOSPITAL_COMMUNITY): Payer: Medicare Other

## 2012-11-26 ENCOUNTER — Emergency Department (HOSPITAL_COMMUNITY)
Admission: EM | Admit: 2012-11-26 | Discharge: 2012-11-26 | Disposition: A | Discharge status: Home / Self Care | Payer: Medicare Other | Attending: Emergency Medicine | Admitting: Emergency Medicine

## 2012-11-26 ENCOUNTER — Encounter (HOSPITAL_COMMUNITY): Payer: Self-pay | Admitting: Emergency Medicine

## 2012-11-26 DIAGNOSIS — Z8639 Personal history of other endocrine, nutritional and metabolic disease: Secondary | ICD-10-CM | POA: Insufficient documentation

## 2012-11-26 DIAGNOSIS — E119 Type 2 diabetes mellitus without complications: Secondary | ICD-10-CM | POA: Insufficient documentation

## 2012-11-26 DIAGNOSIS — M25511 Pain in right shoulder: Secondary | ICD-10-CM

## 2012-11-26 DIAGNOSIS — R4789 Other speech disturbances: Secondary | ICD-10-CM

## 2012-11-26 DIAGNOSIS — Z862 Personal history of diseases of the blood and blood-forming organs and certain disorders involving the immune mechanism: Secondary | ICD-10-CM | POA: Insufficient documentation

## 2012-11-26 DIAGNOSIS — R52 Pain, unspecified: Secondary | ICD-10-CM | POA: Insufficient documentation

## 2012-11-26 DIAGNOSIS — Z8701 Personal history of pneumonia (recurrent): Secondary | ICD-10-CM | POA: Insufficient documentation

## 2012-11-26 DIAGNOSIS — I509 Heart failure, unspecified: Secondary | ICD-10-CM | POA: Insufficient documentation

## 2012-11-26 DIAGNOSIS — Z79899 Other long term (current) drug therapy: Secondary | ICD-10-CM | POA: Insufficient documentation

## 2012-11-26 DIAGNOSIS — M25519 Pain in unspecified shoulder: Secondary | ICD-10-CM | POA: Insufficient documentation

## 2012-11-26 DIAGNOSIS — Z87891 Personal history of nicotine dependence: Secondary | ICD-10-CM | POA: Insufficient documentation

## 2012-11-26 DIAGNOSIS — R5381 Other malaise: Secondary | ICD-10-CM | POA: Insufficient documentation

## 2012-11-26 DIAGNOSIS — Z8679 Personal history of other diseases of the circulatory system: Secondary | ICD-10-CM | POA: Insufficient documentation

## 2012-11-26 DIAGNOSIS — I10 Essential (primary) hypertension: Secondary | ICD-10-CM | POA: Insufficient documentation

## 2012-11-26 DIAGNOSIS — R531 Weakness: Secondary | ICD-10-CM

## 2012-11-26 DIAGNOSIS — R5383 Other fatigue: Secondary | ICD-10-CM | POA: Insufficient documentation

## 2012-11-26 DIAGNOSIS — R4781 Slurred speech: Secondary | ICD-10-CM

## 2012-11-26 DIAGNOSIS — Z794 Long term (current) use of insulin: Secondary | ICD-10-CM | POA: Insufficient documentation

## 2012-11-26 LAB — DIFFERENTIAL
Eosinophils Relative: 4 % (ref 0–5)
Lymphocytes Relative: 12 % (ref 12–46)
Lymphs Abs: 1.1 10*3/uL (ref 0.7–4.0)

## 2012-11-26 LAB — URINALYSIS, ROUTINE W REFLEX MICROSCOPIC
Leukocytes, UA: NEGATIVE
Nitrite: NEGATIVE
Specific Gravity, Urine: 1.016 (ref 1.005–1.030)
pH: 5.5 (ref 5.0–8.0)

## 2012-11-26 LAB — COMPREHENSIVE METABOLIC PANEL
ALT: <5 U/L (ref 0–53)
Alkaline Phosphatase: 63 U/L (ref 39–117)
CO2: 32 mEq/L (ref 19–32)
Calcium: 8.6 mg/dL (ref 8.4–10.5)
GFR calc Af Amer: 49 mL/min — ABNORMAL LOW (ref >90)
GFR calc non Af Amer: 42 mL/min — ABNORMAL LOW (ref >90)
Glucose, Bld: 191 mg/dL — ABNORMAL HIGH (ref 70–99)
Sodium: 146 mEq/L — ABNORMAL HIGH (ref 135–145)

## 2012-11-26 LAB — POCT I-STAT, CHEM 8
Chloride: 105 mEq/L (ref 96–112)
HCT: 34 % — ABNORMAL LOW (ref 39.0–52.0)
Potassium: 3.6 mEq/L (ref 3.5–5.1)

## 2012-11-26 LAB — CBC
HCT: 35 % — ABNORMAL LOW (ref 39.0–52.0)
Hemoglobin: 11.9 g/dL — ABNORMAL LOW (ref 13.0–17.0)
MCV: 81.6 fL (ref 78.0–100.0)
Platelets: 187 10*3/uL (ref 150–400)
RBC: 4.29 MIL/uL (ref 4.22–5.81)
WBC: 8.8 10*3/uL (ref 4.0–10.5)

## 2012-11-26 LAB — URINE MICROSCOPIC-ADD ON

## 2012-11-26 LAB — PROTIME-INR: Prothrombin Time: 13.8 seconds (ref 11.6–15.2)

## 2012-11-26 LAB — GLUCOSE, CAPILLARY: Glucose-Capillary: 172 mg/dL — ABNORMAL HIGH (ref 70–99)

## 2012-11-26 LAB — POCT I-STAT TROPONIN I: Troponin i, poc: 0.02 ng/mL (ref 0.00–0.08)

## 2012-11-26 LAB — RAPID URINE DRUG SCREEN, HOSP PERFORMED: Barbiturates: NOT DETECTED

## 2012-11-26 NOTE — ED Provider Notes (Signed)
History     CSN: [id number]  Arrival date & time 11/26/12  1601   First MD Initiated Contact with Patient 11/26/12 1604      Chief Complaint  Patient presents with  . Aphasia    (Consider location/radiation/quality/duration/timing/severity/associated sxs/prior treatment) HPI Pt presents with c/o report of possible slurred speech and generalized weakness. Pt came to the ED as a code stroke notification.  Upon arrival he has no neurologic symptoms.  His primary complaint is right shoulder pain and diffuse body pain in his joints.  Dr. Thad Ranger saw the patient upon arrival and cancelled code stroke- she does not recommend any further stroke workup or MRI she does not think this represents stroke or TIA.  Pt states pain in shoulder has been going on for several weeks.  No new injury.  No chest pain or shortness of breath.  No fever/chills.  No fainting or palpitations.  He states he has been eating and drinking normally.  There are no other associated systemic symptoms, there are no other alleviating or modifying factors.   Past Medical History  Diagnosis Date  . Diabetes mellitus   . Hypertension   . Hyperlipidemia   . CHF (congestive heart failure)   . Enlarged heart   . Pneumonia     hx of     Past Surgical History  Procedure Laterality Date  . Right leg fracture    . Skin graft    . Knee surgery    . Tee without cardioversion  03/17/2012    Procedure: TRANSESOPHAGEAL ECHOCARDIOGRAM (TEE);  Surgeon: Ricki Rodriguez, MD;  Location: [facility] ENDOSCOPY;  Service: Cardiovascular;  Laterality: N/A;    History reviewed. No pertinent family history.  History  Substance Use Topics  . Smoking status: Former Smoker    Quit date: 03/10/1984  . Smokeless tobacco: Never Used  . Alcohol Use: No      Review of Systems ROS reviewed and all otherwise negative except for mentioned in HPI  Allergies  Ancef  Home Medications   Current Outpatient Rx  Name  Route  Sig  Dispense  Refill   . carvedilol (COREG) 25 MG tablet   Oral   Take 0.5 tablets (12.5 mg total) by mouth 2 (two) times daily with a meal.   60 tablet   3   . Cholecalciferol (VITAMIN D) 2000 UNITS CAPS   Oral   Take 1 capsule by mouth daily.         . ciprofloxacin (CIPRO) 250 MG tablet   Oral   Take 1 tablet (250 mg total) by mouth 2 (two) times daily.   10 tablet   0   . furosemide (LASIX) 80 MG tablet   Oral   Take 0.5 tablets (40 mg total) by mouth 2 (two) times daily.   60 tablet   3   . insulin glargine (LANTUS) 100 UNIT/ML injection   Subcutaneous   Inject 10 Units into the skin 2 (two) times daily.   10 mL   5   . Tamsulosin HCl (FLOMAX) 0.4 MG CAPS   Oral   Take 1 capsule (0.4 mg total) by mouth daily.   30 capsule   3   . triamcinolone cream (KENALOG) 0.5 %   Topical   Apply 1 application topically 2 (two) times daily.           BP 182/81  Pulse 70  Temp(Src) 97.9 F (36.6 C)  Resp 17  SpO2 92% Vitals reviewed Physical  Exam Physical Examination: General appearance - alert, well appearing, and in no distress Mental status - alert, oriented to person, place, and time Eyes - pupils equal and reactive, extraocular eye movements intact Mouth - mucous membranes moist, pharynx normal without lesions Chest - clear to auscultation, no wheezes, rales or rhonchi, symmetric air entry Heart - normal rate, regular rhythm, normal S1, S2, no murmurs, rubs, clicks or gallops Abdomen - soft, nontender, nondistended, no masses or organomegaly Back exam - full range of motion, no tenderness, palpable spasm or pain on motion Neurological - alert, oriented x 3, cranial nerves 2-12 tested and intact, strength 5/5 in extremities x 4, sensation intact, FTN and heel to shin testing normal Extremities - peripheral pulses normal, no pedal edema, no clubbing or cyanosis Musculoskeletal- diffuse pain with palpation of right shoulder, pain with ROM of right shoulder Skin - normal coloration  and turgor, no rashes, no suspicious skin lesions noted  ED Course  Procedures (including critical care time)   Date: 11/26/2012  Rate: 74  Rhythm: normal sinus rhythm  QRS Axis: normal  Intervals: normal  ST/T Wave abnormalities: nonspecific ST/T changes  Conduction Disutrbances:prolonged QT  Narrative Interpretation:   Old EKG Reviewed: no significant changes compared to prior ekg of 06/03/12   Labs Reviewed  CBC - Abnormal; Notable for the following:    Hemoglobin 11.9 (*)    HCT 35.0 (*)    All other components within normal limits  DIFFERENTIAL - Abnormal; Notable for the following:    Neutrophils Relative % 78 (*)    All other components within normal limits  COMPREHENSIVE METABOLIC PANEL - Abnormal; Notable for the following:    Sodium 146 (*)    Glucose, Bld 191 (*)    Creatinine, Ser 1.62 (*)    Albumin 3.0 (*)    GFR calc non Af Amer 42 (*)    GFR calc Af Amer 49 (*)    All other components within normal limits  GLUCOSE, CAPILLARY - Abnormal; Notable for the following:    Glucose-Capillary 172 (*)    All other components within normal limits  POCT I-STAT, CHEM 8 - Abnormal; Notable for the following:    Sodium 148 (*)    Creatinine, Ser 1.70 (*)    Glucose, Bld 188 (*)    Calcium, Ion 1.10 (*)    Hemoglobin 11.6 (*)    HCT 34.0 (*)    All other components within normal limits  ETHANOL  PROTIME-INR  APTT  TROPONIN I  URINE RAPID DRUG SCREEN (HOSP PERFORMED)  URINALYSIS, ROUTINE W REFLEX MICROSCOPIC  POCT I-STAT TROPONIN I   Dg Shoulder Right  11/26/2012   *RADIOLOGY REPORT*  Clinical Data: Right shoulder pain.  RIGHT SHOULDER - 2+ VIEW  Comparison: None  Findings: There are mild AC joint and glenohumeral joint degenerative changes but no acute fracture or evidence of avascular necrosis.  No abnormal soft tissue calcifications.  The right lung apex is grossly clear.  IMPRESSION: Degenerative changes but no acute bony findings.   Original Report  Authenticated By: Rudie Meyer, M.D.   Ct Head Wo Contrast  11/26/2012   *RADIOLOGY REPORT*  Clinical Data: Slurred speech, code stroke  CT HEAD WITHOUT CONTRAST  Technique:  Contiguous axial images were obtained from the base of the skull through the vertex without contrast.  Comparison: None.  Findings: No acute intracranial hemorrhage.  No focal mass lesion. No CT evidence of acute infarction.   No midline shift or mass effect.  No hydrocephalus.  Basilar cisterns are patent.  Dystrophic calcifications within the lentiform nuclei and within the cerebellum.  There is generalized cortical atrophy.  There is mild periventricular and white matter hypodensities.  Paranasal sinuses and mastoid air cells are clear.  Orbits are normal.  IMPRESSION:  1.  No acute cortical infarction by CT. 2.  No intracranial hemorrhage. 3.  Atrophy and microvascular disease similar to prior.  Findings conveyed to Dr. Thad Ranger on 11/26/2012 at 1630 hours   Original Report Authenticated By: Genevive Bi, M.D.     1. Slurred speech       MDM  Pt presenting with c/o generalized weakness an episode of slurred speech, normal neurologic exam on evaluation by myself and neurology. Neurology does not recommend any further TIA/stroke workup.  Pts main complaint is right shoulder pain.  Workup reassuring, degenerative disease in right shoulder.  Discharged with strict return precautions.  Pt agreeable with plan.        Ethelda Chick, MD 11/27/12 1540

## 2012-11-26 NOTE — ED Notes (Signed)
Pt given paper scrubs to go home in. Pt has ride home.

## 2012-11-26 NOTE — ED Notes (Signed)
Code Stroke Cancelled by Dr. Thad Ranger

## 2012-11-26 NOTE — ED Notes (Signed)
Per EMS, pt has been having generalized weakness for 3-4 days. Pt became more weak today. When he was trying to get from the bathroom, pt could not walk from the bathroom. He also begin having slurred speech. He became diaphoretic at that time. Currently he does not have slurred speech. He is alert and oriented x4. Complaining of right shoulder and hip pain. CBG 172.

## 2012-11-26 NOTE — ED Notes (Signed)
184/146 is not correct blood pressure.

## 2012-11-26 NOTE — Consult Note (Addendum)
Referring Physician: Linker    Chief Complaint: Slurred speech  HPI: [name] is an 67 y.o. male with multiple complaints of pain that has been having some [progresiveweakness over the past few weeks.  Today when his wife was attempting to get him to the bathroom she felt that he had the acute onset of slurred speech.  EMS was called at that time and the patient was brought in as a code stroke.  Although EMS noted left sided weakness the patient reports that his left leg has been week for months.    Date last known well: Date: 11/26/2012 Time last known well: Time: 14:00 tPA Given: No: Not felt to be a stroke  Past Medical History  Diagnosis Date  . Diabetes mellitus   . Hypertension   . Hyperlipidemia   . CHF (congestive heart failure)   . Enlarged heart   . Pneumonia     hx of     Past Surgical History  Procedure Laterality Date  . Right leg fracture    . Skin graft    . Knee surgery    . Tee without cardioversion  03/17/2012    Procedure: TRANSESOPHAGEAL ECHOCARDIOGRAM (TEE);  Surgeon: Ricki Rodriguez, MD;  Location: [facility] ENDOSCOPY;  Service: Cardiovascular;  Laterality: N/A;    No family history on file.   Social History:  reports that he quit smoking about 28 years ago. He has never used smokeless tobacco. He reports that he does not drink alcohol or use illicit drugs.  Allergies:  Allergies  Allergen Reactions  . Ancef (Cefazolin)     rash    Medications: I have reviewed the patient's current medications. Prior to Admission:  Current outpatient prescriptions:carvedilol (COREG) 25 MG tablet, Take 0.5 tablets (12.5 mg total) by mouth 2 (two) times daily with a meal., Disp: 60 tablet, Rfl: 3;  Cholecalciferol (VITAMIN D) 2000 UNITS CAPS, Take 1 capsule by mouth daily., Disp: , Rfl: ;  ciprofloxacin (CIPRO) 250 MG tablet, Take 1 tablet (250 mg total) by mouth 2 (two) times daily., Disp: 10 tablet, Rfl: 0 furosemide (LASIX) 80 MG tablet, Take 0.5 tablets (40 mg  total) by mouth 2 (two) times daily., Disp: 60 tablet, Rfl: 3;  insulin glargine (LANTUS) 100 UNIT/ML injection, Inject 10 Units into the skin 2 (two) times daily., Disp: 10 mL, Rfl: 5;  Tamsulosin HCl (FLOMAX) 0.4 MG CAPS, Take 1 capsule (0.4 mg total) by mouth daily., Disp: 30 capsule, Rfl: 3 triamcinolone cream (KENALOG) 0.5 %, Apply 1 application topically 2 (two) times daily., Disp: , Rfl:   ROS: History obtained from the patient  General ROS: generalized weakness Psychological ROS: negative for - behavioral disorder, hallucinations, memory difficulties, mood swings or suicidal ideation Ophthalmic ROS: negative for - blurry vision, double vision, eye pain or loss of vision ENT ROS: negative for - epistaxis, nasal discharge, oral lesions, sore throat, tinnitus or vertigo Allergy and Immunology ROS: negative for - hives or itchy/watery eyes Hematological and Lymphatic ROS: negative for - bleeding problems, bruising or swollen lymph nodes Endocrine ROS: negative for - galactorrhea, hair pattern changes, polydipsia/polyuria or temperature intolerance Respiratory ROS: negative for - cough, hemoptysis, shortness of breath or wheezing Cardiovascular ROS: negative for - chest pain, dyspnea on exertion, edema or irregular heartbeat Gastrointestinal ROS: negative for - abdominal pain, diarrhea, hematemesis, nausea/vomiting or stool incontinence Genito-Urinary ROS: negative for - dysuria, hematuria, incontinence or urinary frequency/urgency Musculoskeletal ROS: pain in multiple joints Neurological ROS: as noted in HPI Dermatological ROS:  negative for rash and skin lesion changes  Physical Examination: Blood pressure 152/92, pulse 76, resp. rate 20, SpO2 96.00%.  Neurologic Examination: Mental Status: Alert, oriented, thought content appropriate.  Speech fluent without evidence of aphasia.  Able to follow 3 step commands without difficulty. Cranial Nerves: II: Discs flat bilaterally; Visual  fields grossly normal, pupils equal, round, reactive to light and accommodation III,IV, VI: ptosis not present, extra-ocular motions intact bilaterally V,VII: smile symmetric, facial light touch sensation normal bilaterally VIII: hearing normal bilaterally IX,X: gag reflex present XI: bilateral shoulder shrug XII: midline tongue extension Motor: Right : Upper extremity   5/5 within range but range limited by pain   Left:     Upper extremity   5/5  Lower extremity   5/5         Lower extremity   Only able to lift a few inches off bed and unable to maintain Tone and bulk:normal tone throughout; no atrophy noted Sensory: Pinprick and light touch intact throughout, bilaterally Deep Tendon Reflexes: 2+ in the upper extremities and 1+ in the lower extremities.   Plantars: Right: downgoing   Left: downgoing Cerebellar: normal finger-to-nose and normal heel-to-shin test Gait: Unable to test CV: pulses palpable throughout     Laboratory Studies:  Basic Metabolic Panel:  Recent Labs Lab 11/26/12 1618  NA 148*  K 3.6  CL 105  GLUCOSE 188*  BUN 17  CREATININE 1.70*    Liver Function Tests: No results found for this basename: AST, ALT, ALKPHOS, BILITOT, PROT, ALBUMIN,  in the last 168 hours No results found for this basename: LIPASE, AMYLASE,  in the last 168 hours No results found for this basename: AMMONIA,  in the last 168 hours  CBC:  Recent Labs Lab 11/26/12 1608 11/26/12 1618  WBC 8.8  --   NEUTROABS 6.9  --   HGB 11.9* 11.6*  HCT 35.0* 34.0*  MCV 81.6  --   PLT 187  --     Cardiac Enzymes: No results found for this basename: CKTOTAL, CKMB, CKMBINDEX, TROPONINI,  in the last 168 hours  BNP: No components found with this basename: POCBNP,   CBG:  Recent Labs Lab 11/26/12 1632  GLUCAP 172*    Microbiology: Results for orders placed during the hospital encounter of 06/03/12  URINE CULTURE     Status: None   Collection Time    06/03/12 12:47 PM       Result Value Range Status   Specimen Description URINE, RANDOM   Final   Special Requests NONE   Final   Culture  Setup Time 06/03/2012 14:58   Final   Colony Count 85,000 COLONIES/ML   Final   Culture     Final   Value: Multiple bacterial morphotypes present, none predominant. Suggest appropriate recollection if clinically indicated.   Report Status 06/04/2012 FINAL   Final  URINE CULTURE     Status: None   Collection Time    06/04/12  6:32 AM      Result Value Range Status   Specimen Description URINE, CLEAN CATCH   Final   Special Requests NONE   Final   Culture  Setup Time 06/04/2012 13:18   Final   Colony Count 85,000 COLONIES/ML   Final   Culture     Final   Value: Multiple bacterial morphotypes present, none predominant. Suggest appropriate recollection if clinically indicated.   Report Status 06/05/2012 FINAL   Final    Coagulation Studies:  Recent Labs  11/26/12 1608  LABPROT 13.8  INR 1.07    Urinalysis: No results found for this basename: COLORURINE, APPERANCEUR, LABSPEC, PHURINE, GLUCOSEU, HGBUR, BILIRUBINUR, KETONESUR, PROTEINUR, UROBILINOGEN, NITRITE, LEUKOCYTESUR,  in the last 168 hours  Lipid Panel: No results found for this basename: chol, trig, hdl, cholhdl, vldl, ldlcalc    HgbA1C:  Lab Results  Component Value Date   HGBA1C 7.6* 06/03/2012    Urine Drug Screen:   No results found for this basename: labopia, cocainscrnur, labbenz, amphetmu, thcu, labbarb    Alcohol Level: No results found for this basename: ETH,  in the last 168 hours  Other results: EKG: sinus rhythm at 74 bpm  Imaging: Ct Head Wo Contrast  11/26/2012   *RADIOLOGY REPORT*  Clinical Data: Slurred speech, code stroke  CT HEAD WITHOUT CONTRAST  Technique:  Contiguous axial images were obtained from the base of the skull through the vertex without contrast.  Comparison: None.  Findings: No acute intracranial hemorrhage.  No focal mass lesion. No CT evidence of acute infarction.   No  midline shift or mass effect.  No hydrocephalus.  Basilar cisterns are patent.  Dystrophic calcifications within the lentiform nuclei and within the cerebellum.  There is generalized cortical atrophy.  There is mild periventricular and white matter hypodensities.  Paranasal sinuses and mastoid air cells are clear.  Orbits are normal.  IMPRESSION:  1.  No acute cortical infarction by CT. 2.  No intracranial hemorrhage. 3.  Atrophy and microvascular disease similar to prior.  Findings conveyed to Dr. Thad Ranger on 11/26/2012 at 1630 hours   Original Report Authenticated By: Genevive Bi, M.D.    Assessment: 67 y.o. male presenting with complaints of slurred speech and left sided weakness.  It seems that patient is at baseline and his biggest complaint at this time is that of joint pain.  The pain does seem to affect his speech during evaluation.  No acute focality noted on examination.  Head CT reviewed and shows no acute changes.  Do not feel that patient has had a stroke or TIA at this time.    Stroke Risk Factors - diabetes mellitus, hyperlipidemia and hypertension  Plan: 1. No further neurologic intervention is recommended at this time.  If further questions arise, please call or page at that time.  Thank you for allowing neurology to participate in the care of this patient.  Case discussed with Dr. Jimmey Ralph, MD [facility] [phone] 11/26/2012  5:05 PM

## 2012-11-26 NOTE — ED Notes (Signed)
Report given to James RN

## 2012-12-07 ENCOUNTER — Encounter (HOSPITAL_COMMUNITY): Payer: Self-pay | Admitting: Vascular Surgery

## 2012-12-07 ENCOUNTER — Emergency Department (HOSPITAL_COMMUNITY)
Admission: EM | Admit: 2012-12-07 | Discharge: 2012-12-07 | Disposition: A | Discharge status: Home / Self Care | Payer: Medicare Other | Attending: Emergency Medicine | Admitting: Emergency Medicine

## 2012-12-07 DIAGNOSIS — R531 Weakness: Secondary | ICD-10-CM

## 2012-12-07 DIAGNOSIS — E119 Type 2 diabetes mellitus without complications: Secondary | ICD-10-CM | POA: Insufficient documentation

## 2012-12-07 DIAGNOSIS — Z79899 Other long term (current) drug therapy: Secondary | ICD-10-CM | POA: Insufficient documentation

## 2012-12-07 DIAGNOSIS — Z87891 Personal history of nicotine dependence: Secondary | ICD-10-CM | POA: Insufficient documentation

## 2012-12-07 DIAGNOSIS — I509 Heart failure, unspecified: Secondary | ICD-10-CM | POA: Insufficient documentation

## 2012-12-07 DIAGNOSIS — I10 Essential (primary) hypertension: Secondary | ICD-10-CM | POA: Insufficient documentation

## 2012-12-07 DIAGNOSIS — R609 Edema, unspecified: Secondary | ICD-10-CM | POA: Insufficient documentation

## 2012-12-07 DIAGNOSIS — M25511 Pain in right shoulder: Secondary | ICD-10-CM

## 2012-12-07 DIAGNOSIS — Z794 Long term (current) use of insulin: Secondary | ICD-10-CM | POA: Insufficient documentation

## 2012-12-07 DIAGNOSIS — E785 Hyperlipidemia, unspecified: Secondary | ICD-10-CM | POA: Insufficient documentation

## 2012-12-07 DIAGNOSIS — I517 Cardiomegaly: Secondary | ICD-10-CM | POA: Insufficient documentation

## 2012-12-07 DIAGNOSIS — Z8701 Personal history of pneumonia (recurrent): Secondary | ICD-10-CM | POA: Insufficient documentation

## 2012-12-07 DIAGNOSIS — M6281 Muscle weakness (generalized): Secondary | ICD-10-CM | POA: Insufficient documentation

## 2012-12-07 DIAGNOSIS — M25519 Pain in unspecified shoulder: Secondary | ICD-10-CM | POA: Insufficient documentation

## 2012-12-07 LAB — CBC WITH DIFFERENTIAL/PLATELET
Basophils Absolute: 0 10*3/uL (ref 0.0–0.1)
Basophils Relative: 0 % (ref 0–1)
Eosinophils Absolute: 0.4 10*3/uL (ref 0.0–0.7)
Eosinophils Relative: 6 % — ABNORMAL HIGH (ref 0–5)
HCT: 34.2 % — ABNORMAL LOW (ref 39.0–52.0)
Hemoglobin: 11.5 g/dL — ABNORMAL LOW (ref 13.0–17.0)
Lymphocytes Relative: 17 % (ref 12–46)
Lymphs Abs: 1.2 10*3/uL (ref 0.7–4.0)
MCH: 27.4 pg (ref 26.0–34.0)
MCHC: 33.6 g/dL (ref 30.0–36.0)
MCV: 81.4 fL (ref 78.0–100.0)
Monocytes Absolute: 0.5 10*3/uL (ref 0.1–1.0)
Monocytes Relative: 6 % (ref 3–12)
Neutro Abs: 5.1 10*3/uL (ref 1.7–7.7)
Neutrophils Relative %: 71 % (ref 43–77)
Platelets: 180 10*3/uL (ref 150–400)
RBC: 4.2 MIL/uL — ABNORMAL LOW (ref 4.22–5.81)
RDW: 14.3 % (ref 11.5–15.5)
WBC: 7.2 10*3/uL (ref 4.0–10.5)

## 2012-12-07 LAB — BASIC METABOLIC PANEL
BUN: 16 mg/dL (ref 6–23)
CO2: 33 mEq/L — ABNORMAL HIGH (ref 19–32)
Calcium: 8.5 mg/dL (ref 8.4–10.5)
Chloride: 106 mEq/L (ref 96–112)
Creatinine, Ser: 1.38 mg/dL — ABNORMAL HIGH (ref 0.50–1.35)
GFR calc Af Amer: 60 mL/min — ABNORMAL LOW (ref >90)
GFR calc non Af Amer: 51 mL/min — ABNORMAL LOW (ref >90)
Glucose, Bld: 171 mg/dL — ABNORMAL HIGH (ref 70–99)
Potassium: 3.1 mEq/L — ABNORMAL LOW (ref 3.5–5.1)
Sodium: 145 mEq/L (ref 135–145)

## 2012-12-07 LAB — URINE MICROSCOPIC-ADD ON

## 2012-12-07 LAB — URINALYSIS, ROUTINE W REFLEX MICROSCOPIC
Bilirubin Urine: NEGATIVE
Glucose, UA: NEGATIVE mg/dL
Ketones, ur: NEGATIVE mg/dL
Leukocytes, UA: NEGATIVE
Nitrite: NEGATIVE
Protein, ur: 100 mg/dL — AB
Specific Gravity, Urine: 1.015 (ref 1.005–1.030)
Urobilinogen, UA: 1 mg/dL (ref 0.0–1.0)
pH: 5.5 (ref 5.0–8.0)

## 2012-12-07 LAB — TSH: TSH: 2.621 u[IU]/mL (ref 0.350–4.500)

## 2012-12-07 MED ORDER — ONDANSETRON HCL 4 MG/2ML IJ SOLN
4.0000 mg | Freq: Once | INTRAMUSCULAR | Status: AC
  Administered 2012-12-07: 4 mg via INTRAVENOUS
  Filled 2012-12-07: qty 2

## 2012-12-07 MED ORDER — MORPHINE SULFATE 4 MG/ML IJ SOLN
6.0000 mg | Freq: Once | INTRAMUSCULAR | Status: AC
  Administered 2012-12-07: 6 mg via INTRAVENOUS
  Filled 2012-12-07: qty 2

## 2012-12-07 MED ORDER — TRAMADOL HCL 50 MG PO TABS: 50.0000 mg | ORAL_TABLET | Freq: Four times a day (QID) | ORAL | Status: DC | PRN

## 2012-12-07 MED ORDER — POTASSIUM CHLORIDE CRYS ER 20 MEQ PO TBCR
40.0000 meq | EXTENDED_RELEASE_TABLET | Freq: Once | ORAL | Status: AC
  Administered 2012-12-07: 40 meq via ORAL
  Filled 2012-12-07: qty 2

## 2012-12-07 NOTE — ED Notes (Addendum)
Pt reports to the ED for eval of generalized weakness. Pt was here a week ago for hx of the same. Pts family reports increased weakness, dehydration, decreased appetite, hypertension, and cramping pain in his right shoulder, neck, right arm, and right middle and ring finger. CMS and full ROM. 12 lead revealed elevation V1 and V2 but pt has hx of LVH and CHF. Pt reports dependent swelling to bilateral feet. Swelling worse on the left foot. Pt denies any SOB or CP. Pt A&O x4.

## 2012-12-12 NOTE — ED Provider Notes (Signed)
History     67 year old male with generalized weakness. Patient is in no energy and low motivation he bed. No focal weakness. Decreased appetite. Persistent pain in his right shoulder. This has been ongoing for several weeks. Denies trauma. No acute numbness, tingling or loss of strength. No chest pain. No shortness of breath. No fevers or chills. No urinary complaints. Chronic lower extremity edema. Denies acute change. No recent medication changes. Reports blood sugars have been consistently in 100s.   CSN: [id number] Arrival date & time 12/07/12  1418  First MD Initiated Contact with Patient 12/07/12 1424     Chief Complaint  Patient presents with  . Weakness   (Consider location/radiation/quality/duration/timing/severity/associated sxs/prior Treatment) HPI Past Medical History  Diagnosis Date  . Diabetes mellitus   . Hypertension   . Hyperlipidemia   . CHF (congestive heart failure)   . Enlarged heart   . Pneumonia     hx of    Past Surgical History  Procedure Laterality Date  . Right leg fracture    . Skin graft    . Knee surgery    . Tee without cardioversion  03/17/2012    Procedure: TRANSESOPHAGEAL ECHOCARDIOGRAM (TEE);  Surgeon: Ricki Rodriguez, MD;  Location: [facility] ENDOSCOPY;  Service: Cardiovascular;  Laterality: N/A;   No family history on file. History  Substance Use Topics  . Smoking status: Former Smoker    Quit date: 03/10/1984  . Smokeless tobacco: Never Used  . Alcohol Use: No    Review of Systems  All systems reviewed and negative, other than as noted in HPI.   Allergies  Ancef  Home Medications   Current Outpatient Rx  Name  Route  Sig  Dispense  Refill  . carvedilol (COREG) 25 MG tablet   Oral   Take 0.5 tablets (12.5 mg total) by mouth 2 (two) times daily with a meal.   60 tablet   3   . Cholecalciferol (VITAMIN D) 2000 UNITS CAPS   Oral   Take 1 capsule by mouth daily.         . furosemide (LASIX) 80 MG tablet   Oral   Take 0.5  tablets (40 mg total) by mouth 2 (two) times daily.   60 tablet   3   . insulin glargine (LANTUS) 100 UNIT/ML injection   Subcutaneous   Inject 10 Units into the skin 2 (two) times daily.   10 mL   5   . Tamsulosin HCl (FLOMAX) 0.4 MG CAPS   Oral   Take 1 capsule (0.4 mg total) by mouth daily.   30 capsule   3   . triamcinolone cream (KENALOG) 0.5 %   Topical   Apply 1 application topically 2 (two) times daily.         . traMADol (ULTRAM) 50 MG tablet   Oral   Take 1 tablet (50 mg total) by mouth every 6 (six) hours as needed for pain.   20 tablet   0    BP 153/69  Pulse 69  Temp(Src) 97.6 F (36.4 C) (Oral)  Resp 12  SpO2 97% Physical Exam  Nursing note and vitals reviewed. Constitutional: He is oriented to person, place, and time. He appears well-developed and well-nourished. No distress.  HENT:  Head: Normocephalic and atraumatic.  Eyes: Conjunctivae are normal. Right eye exhibits no discharge. Left eye exhibits no discharge.  Neck: Neck supple.  Cardiovascular: Normal rate, regular rhythm and normal heart sounds.  Exam reveals no gallop  and no friction rub.   No murmur heard. Pulmonary/Chest: Effort normal and breath sounds normal. No respiratory distress.  Abdominal: Soft. He exhibits no distension. There is no tenderness.  Musculoskeletal: He exhibits no edema and no tenderness.  Neurological: He is alert and oriented to person, place, and time. No cranial nerve deficit. He exhibits normal muscle tone. Coordination normal.  Skin: Skin is warm and dry.  Psychiatric: He has a normal mood and affect. His behavior is normal. Thought content normal.    ED Course  Procedures (including critical care time) Labs Reviewed  BASIC METABOLIC PANEL - Abnormal; Notable for the following:    Potassium 3.1 (*)    CO2 33 (*)    Glucose, Bld 171 (*)    Creatinine, Ser 1.38 (*)    GFR calc non Af Amer 51 (*)    GFR calc Af Amer 60 (*)    All other components within  normal limits  CBC WITH DIFFERENTIAL - Abnormal; Notable for the following:    RBC 4.20 (*)    Hemoglobin 11.5 (*)    HCT 34.2 (*)    Eosinophils Relative 6 (*)    All other components within normal limits  URINALYSIS, ROUTINE W REFLEX MICROSCOPIC - Abnormal; Notable for the following:    Hgb urine dipstick TRACE (*)    Protein, ur 100 (*)    All other components within normal limits  URINE MICROSCOPIC-ADD ON - Abnormal; Notable for the following:    Casts HYALINE CASTS (*)    All other components within normal limits  TSH   No results found. 1. Generalized weakness   2. Right shoulder pain    EKG:  Rhythm: normal sinus Vent. rate 80 BPM PR interval 164 ms QRS duration 102 ms QT/QTc 436/503 ms LVH ST segments: NS ST changes, likely repol abnormalities from LVH   MDM  66yM with generalized weakness. Appears to be deconditioned. Nonfocal neuro exam. W/u pretty unremarkable. TSH ordered, but discussed w/ pt/wife that won't be resulted during ED visit today. Has PCP established. Atraumatic R shoulder pain. No emergent need for imaging. May benefit from injection. Discuss with PCP and referral to sports med/ortho if felt may help. Close outpt FU.   Raeford Razor, MD 12/12/12 1010

## 2013-01-24 ENCOUNTER — Emergency Department (HOSPITAL_COMMUNITY)
Admission: EM | Admit: 2013-01-24 | Discharge: 2013-01-24 | Disposition: A | Discharge status: Home / Self Care | Payer: Medicare Other | Attending: Emergency Medicine | Admitting: Emergency Medicine

## 2013-01-24 ENCOUNTER — Encounter (HOSPITAL_COMMUNITY): Payer: Self-pay

## 2013-01-24 ENCOUNTER — Emergency Department (HOSPITAL_COMMUNITY): Payer: Medicare Other

## 2013-01-24 DIAGNOSIS — R350 Frequency of micturition: Secondary | ICD-10-CM | POA: Insufficient documentation

## 2013-01-24 DIAGNOSIS — R531 Weakness: Secondary | ICD-10-CM

## 2013-01-24 DIAGNOSIS — R5381 Other malaise: Secondary | ICD-10-CM | POA: Insufficient documentation

## 2013-01-24 DIAGNOSIS — Z87891 Personal history of nicotine dependence: Secondary | ICD-10-CM | POA: Insufficient documentation

## 2013-01-24 DIAGNOSIS — R300 Dysuria: Secondary | ICD-10-CM | POA: Insufficient documentation

## 2013-01-24 DIAGNOSIS — R42 Dizziness and giddiness: Secondary | ICD-10-CM | POA: Insufficient documentation

## 2013-01-24 DIAGNOSIS — I10 Essential (primary) hypertension: Secondary | ICD-10-CM | POA: Insufficient documentation

## 2013-01-24 DIAGNOSIS — Z8679 Personal history of other diseases of the circulatory system: Secondary | ICD-10-CM | POA: Insufficient documentation

## 2013-01-24 DIAGNOSIS — I509 Heart failure, unspecified: Secondary | ICD-10-CM | POA: Insufficient documentation

## 2013-01-24 DIAGNOSIS — Z794 Long term (current) use of insulin: Secondary | ICD-10-CM | POA: Insufficient documentation

## 2013-01-24 DIAGNOSIS — Z8701 Personal history of pneumonia (recurrent): Secondary | ICD-10-CM | POA: Insufficient documentation

## 2013-01-24 DIAGNOSIS — R3989 Other symptoms and signs involving the genitourinary system: Secondary | ICD-10-CM | POA: Insufficient documentation

## 2013-01-24 DIAGNOSIS — Z79899 Other long term (current) drug therapy: Secondary | ICD-10-CM | POA: Insufficient documentation

## 2013-01-24 DIAGNOSIS — Z8639 Personal history of other endocrine, nutritional and metabolic disease: Secondary | ICD-10-CM | POA: Insufficient documentation

## 2013-01-24 DIAGNOSIS — E119 Type 2 diabetes mellitus without complications: Secondary | ICD-10-CM | POA: Insufficient documentation

## 2013-01-24 DIAGNOSIS — Z862 Personal history of diseases of the blood and blood-forming organs and certain disorders involving the immune mechanism: Secondary | ICD-10-CM | POA: Insufficient documentation

## 2013-01-24 DIAGNOSIS — R61 Generalized hyperhidrosis: Secondary | ICD-10-CM | POA: Insufficient documentation

## 2013-01-24 LAB — CBC WITH DIFFERENTIAL/PLATELET
Basophils Absolute: 0 10*3/uL (ref 0.0–0.1)
Basophils Relative: 0 % (ref 0–1)
Eosinophils Relative: 4 % (ref 0–5)
HCT: 38 % — ABNORMAL LOW (ref 39.0–52.0)
MCHC: 33.4 g/dL (ref 30.0–36.0)
Monocytes Absolute: 0.5 10*3/uL (ref 0.1–1.0)
Neutro Abs: 8 10*3/uL — ABNORMAL HIGH (ref 1.7–7.7)
Platelets: 227 10*3/uL (ref 150–400)
RDW: 14 % (ref 11.5–15.5)

## 2013-01-24 LAB — URINALYSIS, ROUTINE W REFLEX MICROSCOPIC
Glucose, UA: NEGATIVE mg/dL
Ketones, ur: NEGATIVE mg/dL
Leukocytes, UA: NEGATIVE
Protein, ur: 100 mg/dL — AB
Urobilinogen, UA: 0.2 mg/dL (ref 0.0–1.0)

## 2013-01-24 LAB — COMPREHENSIVE METABOLIC PANEL
AST: 14 U/L (ref 0–37)
Albumin: 3.2 g/dL — ABNORMAL LOW (ref 3.5–5.2)
Calcium: 9.3 mg/dL (ref 8.4–10.5)
Chloride: 106 mEq/L (ref 96–112)
Creatinine, Ser: 1.45 mg/dL — ABNORMAL HIGH (ref 0.50–1.35)
Sodium: 145 mEq/L (ref 135–145)

## 2013-01-24 LAB — URINE MICROSCOPIC-ADD ON

## 2013-01-24 LAB — POCT I-STAT TROPONIN I: Troponin i, poc: 0.02 ng/mL (ref 0.00–0.08)

## 2013-01-24 MED ORDER — SODIUM CHLORIDE 0.9 % IV SOLN: INTRAVENOUS | Status: DC

## 2013-01-24 NOTE — ED Provider Notes (Signed)
CSN: [id number]     Arrival date & time 01/24/13  1221 History     First MD Initiated Contact with Patient 01/24/13 1303     Chief Complaint  Patient presents with  . Weakness  . Urinary Frequency   (Consider location/radiation/quality/duration/timing/severity/associated sxs/prior Treatment) Patient is a 67 y.o. male presenting with weakness and frequency. The history is provided by the patient.  Weakness  Urinary Frequency   patient here complaining of increased weakness stands up x3 days. No symptoms when he is lying flat. Denies any chest pain or shortness of breath. No abdominal pain. When he stands up he becomes dizzy and lightheaded became diaphoretic and his symptoms quickly resolve when he sits down. No skin crease lower extremity edema. Also has some dysuria with foul-smelling urine. Called his Dr. about this and so to come to the ER 3 days ago but he did not want to. No new treatment used prior to arrival  Past Medical History  Diagnosis Date  . Diabetes mellitus   . Hypertension   . Hyperlipidemia   . CHF (congestive heart failure)   . Enlarged heart   . Pneumonia     hx of    Past Surgical History  Procedure Laterality Date  . Right leg fracture    . Skin graft    . Knee surgery    . Tee without cardioversion  03/17/2012    Procedure: TRANSESOPHAGEAL ECHOCARDIOGRAM (TEE);  Surgeon: Ricki Rodriguez, MD;  Location: [facility] ENDOSCOPY;  Service: Cardiovascular;  Laterality: N/A;   No family history on file. History  Substance Use Topics  . Smoking status: Former Smoker    Quit date: 03/10/1984  . Smokeless tobacco: Never Used  . Alcohol Use: No    Review of Systems  Genitourinary: Positive for frequency.  Neurological: Positive for weakness.  All other systems reviewed and are negative.    Allergies  Ancef  Home Medications   Current Outpatient Rx  Name  Route  Sig  Dispense  Refill  . acetaminophen (TYLENOL) 500 MG tablet   Oral   Take 500 mg by mouth  every 6 (six) hours as needed for pain.         Marland Kitchen aspirin 81 MG chewable tablet   Oral   Chew 324 mg by mouth once.         . carvedilol (COREG) 25 MG tablet   Oral   Take 25 mg by mouth 2 (two) times daily with a meal.         . furosemide (LASIX) 80 MG tablet   Oral   Take 40-80 mg by mouth 2 (two) times daily. 80mg  in morning and 40mg  at night         . insulin glargine (LANTUS) 100 UNIT/ML injection   Subcutaneous   Inject 26-28 Units into the skin 2 (two) times daily. 26 units at night 28 units in the morning         . lisinopril (PRINIVIL,ZESTRIL) 10 MG tablet   Oral   Take 20 mg by mouth daily.         . Menthol-Methyl Salicylate (MUSCLE RUB) 10-15 % CREA   Topical   Apply 1 application topically as needed (neck and shoulder pain).         . Multiple Vitamins-Minerals (CENTRUM SILVER PO)   Oral   Take 1 tablet by mouth daily.         . Tamsulosin HCl (FLOMAX) 0.4 MG CAPS  Oral   Take 1 capsule (0.4 mg total) by mouth daily.   30 capsule   3   . triamcinolone cream (KENALOG) 0.5 %   Topical   Apply 1 application topically 2 (two) times daily. Back and buttocks and feet          There were no vitals taken for this visit. Physical Exam  Nursing note and vitals reviewed. Constitutional: He is oriented to person, place, and time. He appears well-developed and well-nourished.  Non-toxic appearance. No distress.  HENT:  Head: Normocephalic and atraumatic.  Eyes: Conjunctivae, EOM and lids are normal. Pupils are equal, round, and reactive to light.  Neck: Normal range of motion. Neck supple. No tracheal deviation present. No mass present.  Cardiovascular: Normal rate, regular rhythm and normal heart sounds.  Exam reveals no gallop.   No murmur heard. Pulmonary/Chest: Effort normal and breath sounds normal. No stridor. No respiratory distress. He has no decreased breath sounds. He has no wheezes. He has no rhonchi. He has no rales.  Abdominal:  Soft. Normal appearance and bowel sounds are normal. He exhibits no distension. There is no tenderness. There is no rebound and no CVA tenderness.  Musculoskeletal: Normal range of motion. He exhibits no edema and no tenderness.  3 plus bilatreal pitting edema  Neurological: He is alert and oriented to person, place, and time. He has normal strength. No cranial nerve deficit or sensory deficit. GCS eye subscore is 4. GCS verbal subscore is 5. GCS motor subscore is 6.  Skin: Skin is warm and dry. No abrasion and no rash noted.  Psychiatric: He has a normal mood and affect. His speech is normal and behavior is normal.    ED Course   Procedures (including critical care time)  Labs Reviewed  URINALYSIS, ROUTINE W REFLEX MICROSCOPIC  CBC WITH DIFFERENTIAL  COMPREHENSIVE METABOLIC PANEL  PRO B NATRIURETIC PEPTIDE   No results found. No diagnosis found.  MDM  Labs reviewed him BP is improved from prior. Hemoglobin stable. Patient is not orthostatic at this time. Chest x-ray without acute findings. Urinalysis is pending and patient to be discharged once that is resolved at  Toy Baker, MD 01/24/13 1554

## 2013-01-24 NOTE — ED Notes (Addendum)
Pt. Is unable to stand to finish orthostatic vital signs. RN and MD was made aware.

## 2013-01-24 NOTE — ED Notes (Signed)
Bed: ZO10 Expected date:  Expected time:  Means of arrival:  Comments: Hall g

## 2013-01-24 NOTE — ED Notes (Signed)
Pt lives at home, c/o increase weakness x3days, urinary frequency, strong urine odor, swelling to feet with hx of CHF

## 2013-03-06 ENCOUNTER — Inpatient Hospital Stay (HOSPITAL_COMMUNITY)
Admission: EM | Admit: 2013-03-06 | Discharge: 2013-03-15 | DRG: 871 | Disposition: A | Discharge status: Skilled Nursing Facility | Payer: Medicare Other | Attending: Cardiology | Admitting: Cardiology

## 2013-03-06 ENCOUNTER — Encounter (HOSPITAL_COMMUNITY): Payer: Self-pay | Admitting: *Deleted

## 2013-03-06 ENCOUNTER — Emergency Department (HOSPITAL_COMMUNITY): Payer: Medicare Other

## 2013-03-06 DIAGNOSIS — E1149 Type 2 diabetes mellitus with other diabetic neurological complication: Secondary | ICD-10-CM | POA: Diagnosis present

## 2013-03-06 DIAGNOSIS — R4182 Altered mental status, unspecified: Secondary | ICD-10-CM

## 2013-03-06 DIAGNOSIS — M79605 Pain in left leg: Secondary | ICD-10-CM

## 2013-03-06 DIAGNOSIS — Z794 Long term (current) use of insulin: Secondary | ICD-10-CM

## 2013-03-06 DIAGNOSIS — E876 Hypokalemia: Secondary | ICD-10-CM | POA: Diagnosis present

## 2013-03-06 DIAGNOSIS — A419 Sepsis, unspecified organism: Secondary | ICD-10-CM | POA: Diagnosis present

## 2013-03-06 DIAGNOSIS — D696 Thrombocytopenia, unspecified: Secondary | ICD-10-CM | POA: Diagnosis present

## 2013-03-06 DIAGNOSIS — R578 Other shock: Secondary | ICD-10-CM | POA: Diagnosis present

## 2013-03-06 DIAGNOSIS — Z87891 Personal history of nicotine dependence: Secondary | ICD-10-CM

## 2013-03-06 DIAGNOSIS — R0600 Dyspnea, unspecified: Secondary | ICD-10-CM

## 2013-03-06 DIAGNOSIS — G9341 Metabolic encephalopathy: Secondary | ICD-10-CM | POA: Diagnosis present

## 2013-03-06 DIAGNOSIS — I5033 Acute on chronic diastolic (congestive) heart failure: Secondary | ICD-10-CM | POA: Diagnosis present

## 2013-03-06 DIAGNOSIS — R5381 Other malaise: Secondary | ICD-10-CM | POA: Diagnosis present

## 2013-03-06 DIAGNOSIS — Z882 Allergy status to sulfonamides status: Secondary | ICD-10-CM

## 2013-03-06 DIAGNOSIS — M79609 Pain in unspecified limb: Secondary | ICD-10-CM

## 2013-03-06 DIAGNOSIS — M7989 Other specified soft tissue disorders: Secondary | ICD-10-CM

## 2013-03-06 DIAGNOSIS — E1142 Type 2 diabetes mellitus with diabetic polyneuropathy: Secondary | ICD-10-CM | POA: Diagnosis present

## 2013-03-06 DIAGNOSIS — R7881 Bacteremia: Secondary | ICD-10-CM

## 2013-03-06 DIAGNOSIS — Z79899 Other long term (current) drug therapy: Secondary | ICD-10-CM

## 2013-03-06 DIAGNOSIS — R601 Generalized edema: Secondary | ICD-10-CM

## 2013-03-06 DIAGNOSIS — E1159 Type 2 diabetes mellitus with other circulatory complications: Secondary | ICD-10-CM

## 2013-03-06 DIAGNOSIS — E785 Hyperlipidemia, unspecified: Secondary | ICD-10-CM | POA: Diagnosis present

## 2013-03-06 DIAGNOSIS — Z7982 Long term (current) use of aspirin: Secondary | ICD-10-CM

## 2013-03-06 DIAGNOSIS — E8809 Other disorders of plasma-protein metabolism, not elsewhere classified: Secondary | ICD-10-CM | POA: Diagnosis present

## 2013-03-06 DIAGNOSIS — N184 Chronic kidney disease, stage 4 (severe): Secondary | ICD-10-CM | POA: Diagnosis present

## 2013-03-06 DIAGNOSIS — I509 Heart failure, unspecified: Secondary | ICD-10-CM | POA: Diagnosis present

## 2013-03-06 DIAGNOSIS — D649 Anemia, unspecified: Secondary | ICD-10-CM | POA: Diagnosis present

## 2013-03-06 DIAGNOSIS — Z6831 Body mass index (BMI) 31.0-31.9, adult: Secondary | ICD-10-CM

## 2013-03-06 DIAGNOSIS — R579 Shock, unspecified: Secondary | ICD-10-CM

## 2013-03-06 DIAGNOSIS — I129 Hypertensive chronic kidney disease with stage 1 through stage 4 chronic kidney disease, or unspecified chronic kidney disease: Secondary | ICD-10-CM | POA: Diagnosis present

## 2013-03-06 DIAGNOSIS — N179 Acute kidney failure, unspecified: Secondary | ICD-10-CM | POA: Diagnosis present

## 2013-03-06 DIAGNOSIS — Z23 Encounter for immunization: Secondary | ICD-10-CM

## 2013-03-06 DIAGNOSIS — E870 Hyperosmolality and hypernatremia: Secondary | ICD-10-CM | POA: Diagnosis present

## 2013-03-06 LAB — COMPREHENSIVE METABOLIC PANEL
AST: 14 U/L (ref 0–37)
Alkaline Phosphatase: 78 U/L (ref 39–117)
BUN: 13 mg/dL (ref 6–23)
CO2: 30 mEq/L (ref 19–32)
Chloride: 107 mEq/L (ref 96–112)
Creatinine, Ser: 1.37 mg/dL — ABNORMAL HIGH (ref 0.50–1.35)
GFR calc non Af Amer: 51 mL/min — ABNORMAL LOW (ref >90)
Potassium: 3.3 mEq/L — ABNORMAL LOW (ref 3.5–5.1)
Total Bilirubin: 0.5 mg/dL (ref 0.3–1.2)
Total Protein: 7.4 g/dL (ref 6.0–8.3)

## 2013-03-06 LAB — URINALYSIS, ROUTINE W REFLEX MICROSCOPIC
Glucose, UA: NEGATIVE mg/dL
Ketones, ur: NEGATIVE mg/dL
Leukocytes, UA: NEGATIVE
Nitrite: NEGATIVE
Specific Gravity, Urine: 1.019 (ref 1.005–1.030)
pH: 5 (ref 5.0–8.0)

## 2013-03-06 LAB — URINE MICROSCOPIC-ADD ON

## 2013-03-06 LAB — LIPASE, BLOOD: Lipase: 8 U/L — ABNORMAL LOW (ref 11–59)

## 2013-03-06 LAB — LACTIC ACID, PLASMA: Lactic Acid, Venous: 1.5 mmol/L (ref 0.5–2.2)

## 2013-03-06 LAB — TROPONIN I
Troponin I: <0.3 ng/mL (ref <0.30)
Troponin I: <0.3 ng/mL (ref <0.30)

## 2013-03-06 LAB — GLUCOSE, CAPILLARY: Glucose-Capillary: 132 mg/dL — ABNORMAL HIGH (ref 70–99)

## 2013-03-06 MED ORDER — INSULIN ASPART 100 UNIT/ML ~~LOC~~ SOLN
0.0000 [IU] | Freq: Three times a day (TID) | SUBCUTANEOUS | Status: DC
  Administered 2013-03-07: 2 [IU] via SUBCUTANEOUS
  Administered 2013-03-07: 1 [IU] via SUBCUTANEOUS
  Administered 2013-03-08: 2 [IU] via SUBCUTANEOUS

## 2013-03-06 MED ORDER — POTASSIUM CHLORIDE CRYS ER 20 MEQ PO TBCR
20.0000 meq | EXTENDED_RELEASE_TABLET | Freq: Two times a day (BID) | ORAL | Status: DC
  Administered 2013-03-06 – 2013-03-08 (×4): 20 meq via ORAL
  Filled 2013-03-06 (×5): qty 1

## 2013-03-06 MED ORDER — SODIUM CHLORIDE 0.9 % IJ SOLN: 3.0000 mL | INTRAMUSCULAR | Status: DC | PRN

## 2013-03-06 MED ORDER — ASPIRIN EC 81 MG PO TBEC
81.0000 mg | DELAYED_RELEASE_TABLET | Freq: Every day | ORAL | Status: DC
  Administered 2013-03-07 – 2013-03-14 (×8): 81 mg via ORAL
  Filled 2013-03-06 (×9): qty 1

## 2013-03-06 MED ORDER — ONDANSETRON HCL 4 MG/2ML IJ SOLN
4.0000 mg | Freq: Once | INTRAMUSCULAR | Status: AC
  Administered 2013-03-06: 4 mg via INTRAVENOUS
  Filled 2013-03-06: qty 2

## 2013-03-06 MED ORDER — ASPIRIN 81 MG PO CHEW
324.0000 mg | CHEWABLE_TABLET | Freq: Once | ORAL | Status: AC
  Administered 2013-03-06: 23:00:00 324 mg via ORAL
  Filled 2013-03-06: qty 4

## 2013-03-06 MED ORDER — CARVEDILOL 25 MG PO TABS
25.0000 mg | ORAL_TABLET | Freq: Two times a day (BID) | ORAL | Status: DC
  Administered 2013-03-07: 07:00:00 25 mg via ORAL
  Filled 2013-03-06 (×3): qty 1

## 2013-03-06 MED ORDER — HEPARIN SODIUM (PORCINE) 5000 UNIT/ML IJ SOLN
5000.0000 [IU] | Freq: Three times a day (TID) | INTRAMUSCULAR | Status: DC
  Administered 2013-03-07 – 2013-03-08 (×5): 5000 [IU] via SUBCUTANEOUS
  Filled 2013-03-06 (×7): qty 1

## 2013-03-06 MED ORDER — FUROSEMIDE 10 MG/ML IJ SOLN
40.0000 mg | Freq: Two times a day (BID) | INTRAMUSCULAR | Status: DC
  Administered 2013-03-06 – 2013-03-07 (×2): 40 mg via INTRAVENOUS
  Filled 2013-03-06 (×3): qty 4

## 2013-03-06 MED ORDER — SODIUM CHLORIDE 0.9 % IV SOLN
250.0000 mL | INTRAVENOUS | Status: DC | PRN
  Filled 2013-03-06: qty 250

## 2013-03-06 MED ORDER — ACETAMINOPHEN 325 MG PO TABS
650.0000 mg | ORAL_TABLET | ORAL | Status: DC | PRN
  Administered 2013-03-07 – 2013-03-12 (×2): 650 mg via ORAL
  Filled 2013-03-06 (×2): qty 2

## 2013-03-06 MED ORDER — PANTOPRAZOLE SODIUM 40 MG PO TBEC
40.0000 mg | DELAYED_RELEASE_TABLET | Freq: Every day | ORAL | Status: DC
  Administered 2013-03-07 – 2013-03-10 (×3): 40 mg via ORAL
  Filled 2013-03-06 (×3): qty 1

## 2013-03-06 MED ORDER — SODIUM CHLORIDE 0.9 % IJ SOLN
3.0000 mL | Freq: Two times a day (BID) | INTRAMUSCULAR | Status: DC
  Administered 2013-03-06 – 2013-03-14 (×14): 3 mL via INTRAVENOUS

## 2013-03-06 MED ORDER — TAMSULOSIN HCL 0.4 MG PO CAPS
0.4000 mg | ORAL_CAPSULE | Freq: Every day | ORAL | Status: DC
  Administered 2013-03-07: 11:00:00 0.4 mg via ORAL
  Filled 2013-03-06: qty 1

## 2013-03-06 MED ORDER — LISINOPRIL 10 MG PO TABS
10.0000 mg | ORAL_TABLET | Freq: Every day | ORAL | Status: DC
  Filled 2013-03-06: qty 1

## 2013-03-06 MED ORDER — ONDANSETRON HCL 4 MG/2ML IJ SOLN: 4.0000 mg | Freq: Four times a day (QID) | INTRAMUSCULAR | Status: DC | PRN

## 2013-03-06 MED ORDER — ISOSORB DINITRATE-HYDRALAZINE 20-37.5 MG PO TABS
1.0000 | ORAL_TABLET | Freq: Two times a day (BID) | ORAL | Status: DC
  Administered 2013-03-06 – 2013-03-07 (×2): 1 via ORAL
  Filled 2013-03-06 (×3): qty 1

## 2013-03-06 MED ORDER — INSULIN GLARGINE 100 UNIT/ML ~~LOC~~ SOLN
10.0000 [IU] | Freq: Every day | SUBCUTANEOUS | Status: DC
  Administered 2013-03-06: 10 [IU] via SUBCUTANEOUS
  Filled 2013-03-06 (×2): qty 0.1

## 2013-03-06 NOTE — ED Provider Notes (Signed)
CSN: [id number]     Arrival date & time 03/06/13  1453 History   First MD Initiated Contact with Patient 03/06/13 1503     Chief Complaint  Patient presents with  . Nausea  . Emesis  . Weakness   (Consider location/radiation/quality/duration/timing/severity/associated sxs/prior Treatment) Patient is a 67 y.o. male presenting with leg pain and vomiting.  Leg Pain Location:  Leg Injury: no   Leg location:  L leg and L lower leg Pain details:    Quality:  Aching   Radiates to:  Does not radiate   Severity:  Moderate   Onset quality:  Gradual   Duration: unknown onset.   Timing:  Constant   Progression:  Worsening Chronicity:  Chronic Relieved by:  Nothing Worsened by:  Activity, bearing weight, adduction and exercise Ineffective treatments:  None tried Associated symptoms: swelling   Associated symptoms: no back pain, no fever, no neck pain, no numbness, no stiffness and no tingling   Emesis Severity:  Moderate Duration:  1 day Timing:  Constant Quality:  Stomach contents Progression:  Unchanged Chronicity:  New Relieved by:  Nothing Worsened by:  Nothing tried Ineffective treatments:  None tried Associated symptoms: cough and diarrhea   Associated symptoms: no abdominal pain, no arthralgias, no chills, no fever, no headaches and no sore throat     Past Medical History  Diagnosis Date  . Diabetes mellitus   . Hypertension   . Hyperlipidemia   . CHF (congestive heart failure)   . Enlarged heart   . Pneumonia     hx of    Past Surgical History  Procedure Laterality Date  . Right leg fracture    . Skin graft    . Knee surgery    . Tee without cardioversion  03/17/2012    Procedure: TRANSESOPHAGEAL ECHOCARDIOGRAM (TEE);  Surgeon: Ricki Rodriguez, MD;  Location: [facility] ENDOSCOPY;  Service: Cardiovascular;  Laterality: N/A;   No family history on file. History  Substance Use Topics  . Smoking status: Former Smoker    Quit date: 03/10/1984  . Smokeless tobacco:  Never Used  . Alcohol Use: No    Review of Systems  Constitutional: Negative for fever and chills.  HENT: Negative for congestion, sore throat, rhinorrhea and neck pain.   Eyes: Negative for photophobia and visual disturbance.  Respiratory: Negative for cough and shortness of breath.   Cardiovascular: Negative for chest pain and leg swelling.  Gastrointestinal: Positive for vomiting and diarrhea. Negative for nausea, abdominal pain and constipation.  Endocrine: Negative for polydipsia and polyuria.  Genitourinary: Negative for dysuria and hematuria.  Musculoskeletal: Negative for back pain, arthralgias and stiffness.  Skin: Negative for color change and rash.  Neurological: Negative for dizziness, syncope, light-headedness and headaches.  Hematological: Negative for adenopathy. Does not bruise/bleed easily.  All other systems reviewed and are negative.    Allergies  Ancef and Sulfa antibiotics  Home Medications   No current outpatient prescriptions on file. BP 195/96  Pulse 93  Temp(Src) 97.8 F (36.6 C) (Oral)  Resp 20  Ht 5\' 11"  (1.803 m)  Wt 226 lb 6.4 oz (102.694 kg)  BMI 31.59 kg/m2  SpO2 94% Physical Exam  Vitals reviewed. Constitutional: He is oriented to person, place, and time. He appears well-developed and well-nourished.  HENT:  Head: Normocephalic and atraumatic.  Eyes: Conjunctivae and EOM are normal.  Neck: Normal range of motion. Neck supple.  Cardiovascular: Normal rate, regular rhythm and normal heart sounds.   Pulmonary/Chest: Effort normal  and breath sounds normal. No respiratory distress.  Abdominal: He exhibits no distension. There is no tenderness. There is no rebound and no guarding.  Musculoskeletal: Normal range of motion.       Right lower leg: He exhibits swelling and edema (2+). He exhibits no tenderness.       Left lower leg: He exhibits tenderness, swelling and edema (3+).  Neurological: He is alert and oriented to person, place, and  time.  Skin: Skin is warm and dry.    ED Course  Procedures (including critical care time) Labs Review Labs Reviewed  COMPREHENSIVE METABOLIC PANEL - Abnormal; Notable for the following:    Sodium 147 (*)    Potassium 3.3 (*)    Glucose, Bld 128 (*)    Creatinine, Ser 1.37 (*)    Albumin 3.2 (*)    GFR calc non Af Amer 51 (*)    GFR calc Af Amer 60 (*)    All other components within normal limits  LIPASE, BLOOD - Abnormal; Notable for the following:    Lipase 8 (*)    All other components within normal limits  URINALYSIS, ROUTINE W REFLEX MICROSCOPIC - Abnormal; Notable for the following:    APPearance CLOUDY (*)    Hgb urine dipstick MODERATE (*)    Bilirubin Urine SMALL (*)    Protein, ur >300 (*)    All other components within normal limits  PRO B NATRIURETIC PEPTIDE - Abnormal; Notable for the following:    Pro B Natriuretic peptide (BNP) 13246.0 (*)    All other components within normal limits  GLUCOSE, CAPILLARY - Abnormal; Notable for the following:    Glucose-Capillary 132 (*)    All other components within normal limits  URINE MICROSCOPIC-ADD ON - Abnormal; Notable for the following:    Squamous Epithelial / LPF FEW (*)    Casts HYALINE CASTS (*)    All other components within normal limits  LACTIC ACID, PLASMA  TROPONIN I  TROPONIN I  BASIC METABOLIC PANEL  CBC WITH DIFFERENTIAL  COMPREHENSIVE METABOLIC PANEL  MAGNESIUM  PRO B NATRIURETIC PEPTIDE  TROPONIN I  TROPONIN I  TROPONIN I  CBC  HEMOGLOBIN A1C   Results for orders placed during the hospital encounter of 03/06/13  COMPREHENSIVE METABOLIC PANEL      Result Value Range   Sodium 147 (*) 135 - 145 mEq/L   Potassium 3.3 (*) 3.5 - 5.1 mEq/L   Chloride 107  96 - 112 mEq/L   CO2 30  19 - 32 mEq/L   Glucose, Bld 128 (*) 70 - 99 mg/dL   BUN 13  6 - 23 mg/dL   Creatinine, Ser 8.29 (*) 0.50 - 1.35 mg/dL   Calcium 8.9  8.4 - 56.2 mg/dL   Total Protein 7.4  6.0 - 8.3 g/dL   Albumin 3.2 (*) 3.5 - 5.2  g/dL   AST 14  0 - 37 U/L   ALT <5  0 - 53 U/L   Alkaline Phosphatase 78  39 - 117 U/L   Total Bilirubin 0.5  0.3 - 1.2 mg/dL   GFR calc non Af Amer 51 (*) >90 mL/min   GFR calc Af Amer 60 (*) >90 mL/min  LIPASE, BLOOD      Result Value Range   Lipase 8 (*) 11 - 59 U/L  LACTIC ACID, PLASMA      Result Value Range   Lactic Acid, Venous 1.5  0.5 - 2.2 mmol/L  URINALYSIS, ROUTINE W REFLEX  MICROSCOPIC      Result Value Range   Color, Urine YELLOW  YELLOW   APPearance CLOUDY (*) CLEAR   Specific Gravity, Urine 1.019  1.005 - 1.030   pH 5.0  5.0 - 8.0   Glucose, UA NEGATIVE  NEGATIVE mg/dL   Hgb urine dipstick MODERATE (*) NEGATIVE   Bilirubin Urine SMALL (*) NEGATIVE   Ketones, ur NEGATIVE  NEGATIVE mg/dL   Protein, ur >478 (*) NEGATIVE mg/dL   Urobilinogen, UA 0.2  0.0 - 1.0 mg/dL   Nitrite NEGATIVE  NEGATIVE   Leukocytes, UA NEGATIVE  NEGATIVE  PRO B NATRIURETIC PEPTIDE      Result Value Range   Pro B Natriuretic peptide (BNP) 13246.0 (*) 0 - 125 pg/mL  TROPONIN I      Result Value Range   Troponin I <0.30  <0.30 ng/mL  GLUCOSE, CAPILLARY      Result Value Range   Glucose-Capillary 132 (*) 70 - 99 mg/dL   Comment 1 Documented in Chart     Comment 2 Notify RN    TROPONIN I      Result Value Range   Troponin I <0.30  <0.30 ng/mL  URINE MICROSCOPIC-ADD ON      Result Value Range   Squamous Epithelial / LPF FEW (*) RARE   WBC, UA 3-6  <3 WBC/hpf   RBC / HPF 7-10  <3 RBC/hpf   Bacteria, UA RARE  RARE   Casts HYALINE CASTS (*) NEGATIVE   Urine-Other MUCOUS PRESENT      Imaging Review Dg Chest 2 View  03/06/2013   *RADIOLOGY REPORT*  Clinical Data: Nausea, emesis, weakness  CHEST - 2 VIEW  Comparison: 01/24/2013  Findings: The heart size and vascular pattern are normal.  The left lung is clear.  On the right, there is a moderate pleural effusion. Prior studies performed 01/24/2013 and June 23, 2012 show chronic right pleural thickening, but this has significantly increased when  compared to prior study.  IMPRESSION: Superimposed on chronic right pleural thickening, there is a moderate right pleural effusion.   Original Report Authenticated By: Esperanza Heir, M.D.     Date: 03/06/2013  Rate: 89  Rhythm: normal sinus rhythm  QRS Axis: normal  Intervals: normal  ST/T Wave abnormalities: nonspecific ST changes and nonspecific T wave changes  Conduction Disutrbances:none  Narrative Interpretation: NSR, no acute changes  Old EKG Reviewed: unchanged   MDM   1. CHF (congestive heart failure)   2. Leg pain, left   3. Dyspnea   4. Malaise    67 y.o. male  with pertinent PMH of DM, HTN, CHF, PNA presents with leg pain and weakness acutely worsening today.  Pt has been seen in the past for failure to thrive and has poor functional status due to medical comorbities, however states that over the past 24 hours he has now been unable to walk due to pain in his legs, primarily his left.  He denies fever however has had chills, as well as malaise, nausea, vomiting, diarrhea.  He states these issues are chronic, but worse than normal over the last few days.  Physical exam with bil edema of legs, however L>>>R, in setting of increased pain and popliteal pain in pt with poor functional status concerning for DVT.  He denies chest pain or dyspnea, as well as any history of DVT or PE.      [place] negative for DVT, no erythema or warmth to suggest septic arthritis or phlebitis.  Labs returned  as above with acutely elevated BNP.  In setting of dyspnea and malaise, feel pt warrants admission for same and serial troponin.  No fevers or cxr findings to suggest need for abx.  Pt admitted in stable condition.  Lasix given.  Labs and imaging as above reviewed by myself and attending,Dr. Ranae Palms, with whom case was discussed.   1. CHF (congestive heart failure)   2. Leg pain, left   3. Dyspnea   4. Veva Holes, MD 03/06/13 [phone]

## 2013-03-06 NOTE — Progress Notes (Signed)
VASCULAR LAB PRELIMINARY  PRELIMINARY  PRELIMINARY  PRELIMINARY  Left lower extremity venous duplex completed.    Preliminary report:  Left:  No evidence of DVT, superficial thrombosis, or Baker's cyst.  Dalin Caldera, RVT 03/06/2013, 6:08 PM

## 2013-03-06 NOTE — ED Notes (Signed)
Spoke with IV team, notified patient needs IV. Attempts by 2 RN's.

## 2013-03-06 NOTE — H&P (Signed)
Justin Schramm Sr. is an 67 y.o. male.   Chief Complaint: Generalized weakness inability to walk left leg swelling/nausea vomiting poor appetite HPI: Patient is 67 year old male with past medical history significant for multiple medical problems i.e. hypertension, insulin requiring diabetes mellitus, history of congestive heart failure secondary to diastolic dysfunction, chronic kidney disease stage IV, morbid obesity, anemia, came to the ER by EMS complaining of generalized weakness inability to walk left leg swelling poor appetite nausea or vomiting . Patient denies any abdominal pain. As per his wife patient has not been eating for last few days and she is unable to take care of him at home. Patient denies any chest pain or shortness of breath. States most of the time he is in the bed. Patient's wife states he has recently reduced his Lasix dose on her own. Patient was noted to have marked elevated. BNP.  Past Medical History  Diagnosis Date  . Diabetes mellitus   . Hypertension   . Hyperlipidemia   . CHF (congestive heart failure)   . Enlarged heart   . Pneumonia     hx of     Past Surgical History  Procedure Laterality Date  . Right leg fracture    . Skin graft    . Knee surgery    . Tee without cardioversion  03/17/2012    Procedure: TRANSESOPHAGEAL ECHOCARDIOGRAM (TEE);  Surgeon: Ricki Rodriguez, MD;  Location: [facility] ENDOSCOPY;  Service: Cardiovascular;  Laterality: N/A;    No family history on file. Social History:  reports that he quit smoking about 29 years ago. He has never used smokeless tobacco. He reports that he does not drink alcohol or use illicit drugs.  Allergies:  Allergies  Allergen Reactions  . Ancef [Cefazolin]     rash  . Sulfa Antibiotics Rash     (Not in a hospital admission)  Results for orders placed during the hospital encounter of 03/06/13 (from the past 48 hour(s))  GLUCOSE, CAPILLARY     Status: Abnormal   Collection Time    03/06/13  5:16 PM    Result Value Range   Glucose-Capillary 132 (*) 70 - 99 mg/dL   Comment 1 Documented in Chart     Comment 2 Notify RN    COMPREHENSIVE METABOLIC PANEL     Status: Abnormal   Collection Time    03/06/13  6:00 PM      Result Value Range   Sodium 147 (*) 135 - 145 mEq/L   Potassium 3.3 (*) 3.5 - 5.1 mEq/L   Chloride 107  96 - 112 mEq/L   CO2 30  19 - 32 mEq/L   Glucose, Bld 128 (*) 70 - 99 mg/dL   BUN 13  6 - 23 mg/dL   Creatinine, Ser 2.13 (*) 0.50 - 1.35 mg/dL   Calcium 8.9  8.4 - 08.6 mg/dL   Total Protein 7.4  6.0 - 8.3 g/dL   Albumin 3.2 (*) 3.5 - 5.2 g/dL   AST 14  0 - 37 U/L   ALT <5  0 - 53 U/L   Alkaline Phosphatase 78  39 - 117 U/L   Total Bilirubin 0.5  0.3 - 1.2 mg/dL   GFR calc non Af Amer 51 (*) >90 mL/min   GFR calc Af Amer 60 (*) >90 mL/min   Comment: (NOTE)     The eGFR has been calculated using the CKD EPI equation.     This calculation has not been validated in all clinical  situations.     eGFR's persistently <90 mL/min signify possible Chronic Kidney     Disease.  LIPASE, BLOOD     Status: Abnormal   Collection Time    03/06/13  6:00 PM      Result Value Range   Lipase 8 (*) 11 - 59 U/L  LACTIC ACID, PLASMA     Status: None   Collection Time    03/06/13  6:00 PM      Result Value Range   Lactic Acid, Venous 1.5  0.5 - 2.2 mmol/L  PRO B NATRIURETIC PEPTIDE     Status: Abnormal   Collection Time    03/06/13  6:00 PM      Result Value Range   Pro B Natriuretic peptide (BNP) 13246.0 (*) 0 - 125 pg/mL  TROPONIN I     Status: None   Collection Time    03/06/13  6:00 PM      Result Value Range   Troponin I <0.30  <0.30 ng/mL   Comment:            Due to the release kinetics of cTnI,     a negative result within the first hours     of the onset of symptoms does not rule out     myocardial infarction with certainty.     If myocardial infarction is still suspected,     repeat the test at appropriate intervals.  TROPONIN I     Status: None   Collection  Time    03/06/13  6:13 PM      Result Value Range   Troponin I <0.30  <0.30 ng/mL   Comment:            Due to the release kinetics of cTnI,     a negative result within the first hours     of the onset of symptoms does not rule out     myocardial infarction with certainty.     If myocardial infarction is still suspected,     repeat the test at appropriate intervals.   Dg Chest 2 View  03/06/2013   *RADIOLOGY REPORT*  Clinical Data: Nausea, emesis, weakness  CHEST - 2 VIEW  Comparison: 01/24/2013  Findings: The heart size and vascular pattern are normal.  The left lung is clear.  On the right, there is a moderate pleural effusion. Prior studies performed 01/24/2013 and June 04, 2012 show chronic right pleural thickening, but this has significantly increased when compared to prior study.  IMPRESSION: Superimposed on chronic right pleural thickening, there is a moderate right pleural effusion.   Original Report Authenticated By: Esperanza Heir, M.D.    Review of Systems  Constitutional: Positive for malaise/fatigue. Negative for fever and chills.  Eyes: Negative for blurred vision and double vision.  Respiratory: Positive for cough. Negative for hemoptysis, sputum production and shortness of breath.   Cardiovascular: Positive for leg swelling. Negative for chest pain, palpitations and orthopnea.  Gastrointestinal: Positive for nausea and vomiting. Negative for abdominal pain, diarrhea and constipation.  Genitourinary: Negative for dysuria.  Musculoskeletal: Positive for myalgias and joint pain.  Neurological: Positive for dizziness and weakness. Negative for headaches.    Blood pressure 173/140, pulse 93, temperature 99.3 F (37.4 C), temperature source Rectal, resp. rate 20, SpO2 93.00%. Physical Exam  Constitutional: He is oriented to person, place, and time.  HENT:  Head: Normocephalic and atraumatic.  Eyes: Conjunctivae are normal. Pupils are equal, round, and reactive to light. Left  eye  exhibits no discharge. No scleral icterus.  Neck: Normal range of motion. Neck supple. JVD present. No tracheal deviation present. No thyromegaly present.  Cardiovascular: Normal rate and regular rhythm.   Murmur (Soft systolic murmur S4 and S3 gallop noted) heard. Respiratory:  Decreased breath sound at bases right more than left with faint rales  GI: Soft. Bowel sounds are normal. He exhibits distension. There is no tenderness. There is no rebound and no guarding.  Musculoskeletal:  No clubbing cyanosis 2+ edema left leg 1+ edema right leg  Neurological: He is alert and oriented to person, place, and time.     Assessment/Plan Acute decompensated diastolic heart failure Uncontrolled hypertension Generalized weakness/deconditioning Insulin requiring diabetes mellitus Morbid obesity Chronic kidney disease stage IV Anemia Plan As per orders OT PT consult   Padraic Marinos N 03/06/2013, 8:34 PM

## 2013-03-06 NOTE — ED Notes (Signed)
Per EMS pt from home with c/o nausea/vomiting/weakness x 4 days. Unable to keep foods/medications down. Also reports diarrhea. Last episode this am. Pain to bilateral legs, left foot edematous. No abdominal pain. Refused IV with EMS. VS 174/113 HR 97. CBG 150.

## 2013-03-07 ENCOUNTER — Inpatient Hospital Stay (HOSPITAL_COMMUNITY): Payer: Medicare Other

## 2013-03-07 LAB — HEMOGLOBIN A1C: Hgb A1c MFr Bld: 6.2 % — ABNORMAL HIGH (ref <5.7)

## 2013-03-07 LAB — PRO B NATRIURETIC PEPTIDE: Pro B Natriuretic peptide (BNP): 10696 pg/mL — ABNORMAL HIGH (ref 0–125)

## 2013-03-07 LAB — COMPREHENSIVE METABOLIC PANEL
AST: 12 U/L (ref 0–37)
Albumin: 2.5 g/dL — ABNORMAL LOW (ref 3.5–5.2)
Alkaline Phosphatase: 63 U/L (ref 39–117)
BUN: 14 mg/dL (ref 6–23)
CO2: 28 mEq/L (ref 19–32)
Chloride: 110 mEq/L (ref 96–112)
Creatinine, Ser: 1.43 mg/dL — ABNORMAL HIGH (ref 0.50–1.35)
GFR calc non Af Amer: 49 mL/min — ABNORMAL LOW (ref >90)
Glucose, Bld: 179 mg/dL — ABNORMAL HIGH (ref 70–99)
Potassium: 3 mEq/L — ABNORMAL LOW (ref 3.5–5.1)
Total Bilirubin: 0.3 mg/dL (ref 0.3–1.2)

## 2013-03-07 LAB — CBC WITH DIFFERENTIAL/PLATELET
Basophils Relative: 0 % (ref 0–1)
HCT: 31.2 % — ABNORMAL LOW (ref 39.0–52.0)
Hemoglobin: 10.7 g/dL — ABNORMAL LOW (ref 13.0–17.0)
Lymphocytes Relative: 17 % (ref 12–46)
Lymphs Abs: 1.2 10*3/uL (ref 0.7–4.0)
Monocytes Absolute: 0.5 10*3/uL (ref 0.1–1.0)
Monocytes Relative: 7 % (ref 3–12)
Neutro Abs: 4.9 10*3/uL (ref 1.7–7.7)
Neutrophils Relative %: 70 % (ref 43–77)
RBC: 3.87 MIL/uL — ABNORMAL LOW (ref 4.22–5.81)
WBC: 7 10*3/uL (ref 4.0–10.5)

## 2013-03-07 LAB — GLUCOSE, CAPILLARY
Glucose-Capillary: 114 mg/dL — ABNORMAL HIGH (ref 70–99)
Glucose-Capillary: 140 mg/dL — ABNORMAL HIGH (ref 70–99)
Glucose-Capillary: 163 mg/dL — ABNORMAL HIGH (ref 70–99)
Glucose-Capillary: 161 mg/dL — ABNORMAL HIGH (ref 70–99)
Glucose-Capillary: 157 mg/dL — ABNORMAL HIGH (ref 70–99)

## 2013-03-07 LAB — MRSA PCR SCREENING: MRSA by PCR: NEGATIVE

## 2013-03-07 LAB — BLOOD GAS, ARTERIAL
Acid-Base Excess: 2 mmol/L (ref 0.0–2.0)
Bicarbonate: 26.3 mEq/L — ABNORMAL HIGH (ref 20.0–24.0)
Patient temperature: 98.6
TCO2: 27.6 mmol/L (ref 0–100)
pCO2 arterial: 42.5 mmHg (ref 35.0–45.0)
pH, Arterial: 7.408 (ref 7.350–7.450)
pO2, Arterial: 159 mmHg — ABNORMAL HIGH (ref 80.0–100.0)

## 2013-03-07 LAB — BASIC METABOLIC PANEL
Chloride: 110 mEq/L (ref 96–112)
Creatinine, Ser: 1.61 mg/dL — ABNORMAL HIGH (ref 0.50–1.35)
GFR calc Af Amer: 49 mL/min — ABNORMAL LOW (ref >90)
GFR calc non Af Amer: 42 mL/min — ABNORMAL LOW (ref >90)
Potassium: 3.4 mEq/L — ABNORMAL LOW (ref 3.5–5.1)
Sodium: 148 mEq/L — ABNORMAL HIGH (ref 135–145)

## 2013-03-07 LAB — MAGNESIUM: Magnesium: 2 mg/dL (ref 1.5–2.5)

## 2013-03-07 MED ORDER — VANCOMYCIN HCL 10 G IV SOLR
1500.0000 mg | INTRAVENOUS | Status: DC
  Administered 2013-03-08: 1500 mg via INTRAVENOUS
  Filled 2013-03-07 (×2): qty 1500

## 2013-03-07 MED ORDER — VANCOMYCIN HCL 10 G IV SOLR
2000.0000 mg | Freq: Once | INTRAVENOUS | Status: AC
  Administered 2013-03-07: 2000 mg via INTRAVENOUS
  Filled 2013-03-07: qty 2000

## 2013-03-07 MED ORDER — DOPAMINE-DEXTROSE 3.2-5 MG/ML-% IV SOLN
2.0000 ug/kg/min | INTRAVENOUS | Status: DC
  Administered 2013-03-07: 5 ug/kg/min via INTRAVENOUS
  Administered 2013-03-08: 9 ug/kg/min via INTRAVENOUS
  Filled 2013-03-07 (×2): qty 250

## 2013-03-07 MED ORDER — ONDANSETRON HCL 4 MG/2ML IJ SOLN
4.0000 mg | Freq: Three times a day (TID) | INTRAMUSCULAR | Status: DC | PRN
  Filled 2013-03-07: qty 2

## 2013-03-07 MED ORDER — PIPERACILLIN-TAZOBACTAM 3.375 G IVPB
3.3750 g | Freq: Three times a day (TID) | INTRAVENOUS | Status: DC
  Administered 2013-03-07 – 2013-03-10 (×9): 3.375 g via INTRAVENOUS
  Filled 2013-03-07 (×11): qty 50

## 2013-03-07 NOTE — Progress Notes (Signed)
Subjetive Called to see patient as patient suddenly became hypotensive lethargic and hypoxic. Started on dopamine with increased blood pressure and 90s now more awake and alert denies any complaints Objective:  Vital Signs in the last 24 hours: Temp:  [97.2 F (36.2 C)-100.2 F (37.9 C)] 99 F (37.2 C) (09/30 1259) Pulse Rate:  [76-112] 93 (09/30 1310) Resp:  [16-32] 32 (09/30 1310) BP: (78-203)/(52-140) 96/54 mmHg (09/30 1310) SpO2:  [76 %-100 %] 100 % (09/30 1310) Weight:  [102.694 kg (226 lb 6.4 oz)-103.057 kg (227 lb 3.2 oz)] 103.057 kg (227 lb 3.2 oz) (09/30 0500)  Intake/Output from previous day: 09/29 0701 - 09/30 0700 In: 120 [P.O.:120] Out: -  Intake/Output from this shift:    Physical Exam: Neck: no adenopathy, no carotid bruit, no JVD and supple, symmetrical, trachea midline Lungs: Decreased breath sound at bases Heart: regular rate and rhythm, S1, S2 normal and Soft systolic murmur and S3 gallop noted Abdomen: soft, non-tender; bowel sounds normal; no masses,  no organomegaly Extremities: No clubbing cyanosis 2+ edema  Lab Results:  Recent Labs  03/06/13 2300  WBC 7.0  HGB 10.7*  PLT 187    Recent Labs  03/06/13 2300 03/07/13 0545  NA 146* 148*  K 3.0* 3.4*  CL 110 110  CO2 28 28  GLUCOSE 179* 123*  BUN 14 15  CREATININE 1.43* 1.61*    Recent Labs  03/06/13 2300 03/07/13 0545  TROPONINI <0.30 <0.30   Hepatic Function Panel  Recent Labs  03/06/13 2300  PROT 5.9*  ALBUMIN 2.5*  AST 12  ALT <5  ALKPHOS 63  BILITOT 0.3   No results found for this basename: CHOL,  in the last 72 hours No results found for this basename: PROTIME,  in the last 72 hours  Imaging: Imaging results have been reviewed and Dg Chest 2 View  03/06/2013   *RADIOLOGY REPORT*  Clinical Data: Nausea, emesis, weakness  CHEST - 2 VIEW  Comparison: 01/24/2013  Findings: The heart size and vascular pattern are normal.  The left lung is clear.  On the right, there is a  moderate pleural effusion. Prior studies performed 01/24/2013 and Jun 12, 2012 show chronic right pleural thickening, but this has significantly increased when compared to prior study.  IMPRESSION: Superimposed on chronic right pleural thickening, there is a moderate right pleural effusion.   Original Report Authenticated By: Esperanza Heir, M.D.    Cardiac Studies:  Assessment/Plan:  Hypotensive shock rule out sepsis Acute decompensated diastolic heart failure  Uncontrolled hypertension  Generalized weakness/deconditioning  Insulin requiring diabetes mellitus  Morbid obesity  Acute on Chronic kidney disease stage IV  Anemia  Plan  Plan cultures EKG chest x-ray Check serial enzymes has ordered Start vancomycin and Zosyn pending cultures Hold all BP meds for now    LOS: 1 day    [name] 03/07/2013, 1:34 PM

## 2013-03-07 NOTE — Progress Notes (Signed)
PT Cancellation Note  Patient Details Name: Justin Lea Sr. MRN: [id number] DOB: 06-24-1945   Cancelled Treatment:    Reason Eval/Treat Not Completed: Patient not medically ready. Went into pt room. Pt with noted lethargy and difficulty to arouse. Gown was saturated in saliva from pt drooling. RN and RN tech notified and present to assess situation. PT to re-attempt as able.   Marcene Brawn 03/07/2013, 1:46 PM

## 2013-03-07 NOTE — Progress Notes (Signed)
Subjective:  Patient denies any chest pain shortness of breath or abdominal pain states overall feels weak.  Objective:  Vital Signs in the last 24 hours: Temp:  [97.2 F (36.2 C)-100.2 F (37.9 C)] 100.2 F (37.9 C) (09/30 1100) Pulse Rate:  [76-112] 112 (09/30 1100) Resp:  [16-25] 20 (09/30 1100) BP: (144-203)/(75-140) 146/75 mmHg (09/30 1100) SpO2:  [88 %-98 %] 92 % (09/30 1100) Weight:  [102.694 kg (226 lb 6.4 oz)-103.057 kg (227 lb 3.2 oz)] 103.057 kg (227 lb 3.2 oz) (09/30 0500)  Intake/Output from previous day: 09/29 0701 - 09/30 0700 In: 120 [P.O.:120] Out: -  Intake/Output from this shift:    Physical Exam: Neck: no adenopathy, no carotid bruit and supple, symmetrical, trachea midline Lungs: Decreased breath sound at bases right more than left with faint rales Heart: regular rate and rhythm, S1, S2 normal and Soft systolic murmur and S3 gallop noted Abdomen: soft, non-tender; bowel sounds normal; no masses,  no organomegaly Extremities: No clubbing cyanosis 2+ edema noted  Lab Results:  Recent Labs  03/06/13 2300  WBC 7.0  HGB 10.7*  PLT 187    Recent Labs  03/06/13 2300 03/07/13 0545  NA 146* 148*  K 3.0* 3.4*  CL 110 110  CO2 28 28  GLUCOSE 179* 123*  BUN 14 15  CREATININE 1.43* 1.61*    Recent Labs  03/06/13 2300 03/07/13 0545  TROPONINI <0.30 <0.30   Hepatic Function Panel  Recent Labs  03/06/13 2300  PROT 5.9*  ALBUMIN 2.5*  AST 12  ALT <5  ALKPHOS 63  BILITOT 0.3   No results found for this basename: CHOL,  in the last 72 hours No results found for this basename: PROTIME,  in the last 72 hours  Imaging: Imaging results have been reviewed and Dg Chest 2 View  03/06/2013   *RADIOLOGY REPORT*  Clinical Data: Nausea, emesis, weakness  CHEST - 2 VIEW  Comparison: 01/24/2013  Findings: The heart size and vascular pattern are normal.  The left lung is clear.  On the right, there is a moderate pleural effusion. Prior studies performed  01/24/2013 and 06/03/2012 show chronic right pleural thickening, but this has significantly increased when compared to prior study.  IMPRESSION: Superimposed on chronic right pleural thickening, there is a moderate right pleural effusion.   Original Report Authenticated By: Esperanza Heir, M.D.    Cardiac Studies:  Assessment/Plan:  Acute decompensated diastolic heart failure  Uncontrolled hypertension  Generalized weakness/deconditioning  Insulin requiring diabetes mellitus  Morbid obesity  Acute on Chronic kidney disease stage IV  Anemia  Plan Hold ACE inhibitor for now in view of worsening renal function Out of bed to chair OT PT consult Check labs in a.m.  LOS: 1 day    [name] 03/07/2013, 11:09 AM

## 2013-03-07 NOTE — Progress Notes (Signed)
ANTIBIOTIC CONSULT NOTE - INITIAL  Pharmacy Consult for Vancomycin and zosyn Indication: rule out pneumonia  Allergies  Allergen Reactions  . Ancef [Cefazolin]     rash  . Sulfa Antibiotics Rash    Patient Measurements: Height: 5\' 11"  (180.3 cm) Weight: 227 lb 3.2 oz (103.057 kg) IBW/kg (Calculated) : 75.3 Vital Signs: Temp: 98 F (36.7 C) (09/30 1259) Temp src: Oral (09/30 1259) BP: 122/52 mmHg (09/30 1259) Pulse Rate: 93 (09/30 1259) Intake/Output from previous day: 09/29 0701 - 09/30 0700 In: 120 [P.O.:120] Out: -  Intake/Output from this shift:    Labs:  Recent Labs  03/06/13 1800 03/06/13 2300 03/07/13 0545  WBC  --  7.0  --   HGB  --  10.7*  --   PLT  --  187  --   CREATININE 1.37* 1.43* 1.61*   Estimated Creatinine Clearance: 53.7 ml/min (by C-G formula based on Cr of 1.61). No results found for this basename: VANCOTROUGH, VANCOPEAK, VANCORANDOM, GENTTROUGH, GENTPEAK, GENTRANDOM, TOBRATROUGH, TOBRAPEAK, TOBRARND, AMIKACINPEAK, AMIKACINTROU, AMIKACIN,  in the last 72 hours   Microbiology: No results found for this or any previous visit (from the past 720 hour(s)).  Medical History: Past Medical History  Diagnosis Date  . Diabetes mellitus   . Hypertension   . Hyperlipidemia   . CHF (congestive heart failure)   . Enlarged heart   . Pneumonia     hx of     Assessment: 4 YOM with multiple medical problems oresented with generalized weakness, poor appetite and nausea, elevated pro-BNP, and acute on chronic renal failure. BP dropped to 60s this afternoon, and received fluid bolus and dopamine gtt. Pt. Will be transferred to ICU, pharmacy is consulted to start vancomycin and zosyn empiric for possible PNA. Temp = 100.2, wbc 7. Blood, urine and respiratory cultures are ordered. Scr 1.61 trending up, Est. crcl ~ 50 ml/min.  Pt. With documented allergy to ancef- rash. Will monitor s/sx of allergy reaction with first dose of zosyn.  Goal of Therapy:   Vancomycin trough level 15-20 mcg/ml  Plan:  - Vancomycin 2000 mg loading dose then vancomycin 1500 mg IV Q24 hrs - zosyn 3.375g IV Q 8hrs (4 hr infusion) - f/u cultures are renal function - Vancomycin trough at steady state.  Bayard Hugger, PharmD, BCPS  Clinical Pharmacist  Pager: [phone]   03/07/2013,1:06 PM

## 2013-03-07 NOTE — Progress Notes (Signed)
OT Cancellation Note  Patient Details Name: Justin Montenegro Sr. MRN: [id number] DOB: 11/11/1945   Cancelled Treatment:    Reason Eval/Treat Not Completed: Medical issues which prohibited therapy. Pressure is running low  (98/52) and pt is currently having to use a non-rebreather. Will attempt eval tomorrow as appropriate.  Evette Georges [phone] 03/07/2013, 3:01 PM

## 2013-03-07 NOTE — Significant Event (Signed)
Rapid Response Event Note  Overview: Time Called: 1230 Arrival Time: 1234 Event Type: Hypotension;Neurologic  Initial Focused Assessment: BP 78/53  HR 93, RR 24, O2 sat 97% on 4L [place].  Patient minimally responsive, cold and clammy.  CBG 163.  Lung sounds decreased bases, congested cough, heart tones regular.   Family at bedside.  Interventions: 250 cc NS bolus,  BP improved to 122/52 post stopping bolus BP again 96/54. Placed on NRB ABG done Dopamine gtt started at 5 mcg IV team placed new NSL Left hand. Transferred to 2S01 via bed, with O2 and heart monitor. Patient more alert and interactive.  Event Summary: Name of Physician Notified: Dr Sharyn Lull at 1235    at    Outcome: Transferred (Comment) [phone]  Event End Time: 1400  Marcellina Millin

## 2013-03-07 NOTE — Progress Notes (Signed)
Admitted pt from ED aaox3. Oriented to room and call bell. VS taken and recorded. Put on tele monitor,NSR. Continued to monitor pt.

## 2013-03-07 NOTE — Progress Notes (Signed)
Utilization Review Completed.   Vernon Ariel, RN, BSN Nurse Case Manager  [phone]  

## 2013-03-08 ENCOUNTER — Other Ambulatory Visit: Payer: Self-pay

## 2013-03-08 ENCOUNTER — Inpatient Hospital Stay (HOSPITAL_COMMUNITY): Payer: Medicare Other

## 2013-03-08 DIAGNOSIS — E876 Hypokalemia: Secondary | ICD-10-CM

## 2013-03-08 DIAGNOSIS — N189 Chronic kidney disease, unspecified: Secondary | ICD-10-CM

## 2013-03-08 DIAGNOSIS — E870 Hyperosmolality and hypernatremia: Secondary | ICD-10-CM

## 2013-03-08 DIAGNOSIS — E1142 Type 2 diabetes mellitus with diabetic polyneuropathy: Secondary | ICD-10-CM | POA: Diagnosis present

## 2013-03-08 DIAGNOSIS — N179 Acute kidney failure, unspecified: Secondary | ICD-10-CM

## 2013-03-08 DIAGNOSIS — R5381 Other malaise: Secondary | ICD-10-CM

## 2013-03-08 DIAGNOSIS — R4182 Altered mental status, unspecified: Secondary | ICD-10-CM

## 2013-03-08 DIAGNOSIS — R609 Edema, unspecified: Secondary | ICD-10-CM

## 2013-03-08 DIAGNOSIS — R579 Shock, unspecified: Secondary | ICD-10-CM

## 2013-03-08 LAB — CBC
HCT: 29.1 % — ABNORMAL LOW (ref 39.0–52.0)
Hemoglobin: 10 g/dL — ABNORMAL LOW (ref 13.0–17.0)
MCHC: 34.4 g/dL (ref 30.0–36.0)
MCV: 80.6 fL (ref 78.0–100.0)
Platelets: 163 10*3/uL (ref 150–400)
RDW: 15.7 % — ABNORMAL HIGH (ref 11.5–15.5)
WBC: 8.9 10*3/uL (ref 4.0–10.5)

## 2013-03-08 LAB — TROPONIN I
Troponin I: <0.3 ng/mL (ref <0.30)
Troponin I: 8.54 ng/mL (ref <0.30)
Troponin I: <0.3 ng/mL (ref <0.30)

## 2013-03-08 LAB — GLUCOSE, CAPILLARY
Glucose-Capillary: 137 mg/dL — ABNORMAL HIGH (ref 70–99)
Glucose-Capillary: 138 mg/dL — ABNORMAL HIGH (ref 70–99)
Glucose-Capillary: 126 mg/dL — ABNORMAL HIGH (ref 70–99)

## 2013-03-08 LAB — BASIC METABOLIC PANEL
BUN: 29 mg/dL — ABNORMAL HIGH (ref 6–23)
CO2: 26 mEq/L (ref 19–32)
Calcium: 7.6 mg/dL — ABNORMAL LOW (ref 8.4–10.5)
Chloride: 111 mEq/L (ref 96–112)
Creatinine, Ser: 2.66 mg/dL — ABNORMAL HIGH (ref 0.50–1.35)

## 2013-03-08 LAB — PROCALCITONIN: Procalcitonin: 16.32 ng/mL

## 2013-03-08 MED ORDER — ALBUMIN HUMAN 5 % IV SOLN
INTRAVENOUS | Status: AC
  Administered 2013-03-08: 12.5 g
  Filled 2013-03-08: qty 250

## 2013-03-08 MED ORDER — CARVEDILOL 3.125 MG PO TABS
3.1250 mg | ORAL_TABLET | Freq: Two times a day (BID) | ORAL | Status: DC
  Administered 2013-03-09 – 2013-03-10 (×2): 3.125 mg via ORAL
  Filled 2013-03-08 (×4): qty 1

## 2013-03-08 MED ORDER — HEPARIN BOLUS VIA INFUSION
3000.0000 [IU] | Freq: Once | INTRAVENOUS | Status: AC
  Administered 2013-03-08: 3000 [IU] via INTRAVENOUS
  Filled 2013-03-08: qty 3000

## 2013-03-08 MED ORDER — HEPARIN (PORCINE) IN NACL 100-0.45 UNIT/ML-% IJ SOLN
1400.0000 [IU]/h | INTRAMUSCULAR | Status: DC
  Administered 2013-03-08: 1200 [IU]/h via INTRAVENOUS
  Filled 2013-03-08 (×3): qty 250

## 2013-03-08 MED ORDER — DEXTROSE 5 % IV SOLN
2.0000 ug/min | INTRAVENOUS | Status: DC
  Administered 2013-03-08: 2 ug/min via INTRAVENOUS
  Filled 2013-03-08: qty 4

## 2013-03-08 MED ORDER — ALBUMIN HUMAN 25 % IV SOLN
25.0000 g | INTRAVENOUS | Status: DC | PRN
  Administered 2013-03-08: 25 g via INTRAVENOUS
  Filled 2013-03-08 (×2): qty 100

## 2013-03-08 MED ORDER — LIDOCAINE HCL (PF) 1 % IJ SOLN
INTRAMUSCULAR | Status: AC
  Administered 2013-03-08: 5 mL
  Filled 2013-03-08: qty 5

## 2013-03-08 MED ORDER — POTASSIUM CL IN DEXTROSE 5% 20 MEQ/L IV SOLN
20.0000 meq | INTRAVENOUS | Status: DC
  Administered 2013-03-08 – 2013-03-10 (×2): 20 meq via INTRAVENOUS
  Filled 2013-03-08 (×4): qty 1000

## 2013-03-08 MED ORDER — INSULIN ASPART 100 UNIT/ML ~~LOC~~ SOLN
0.0000 [IU] | SUBCUTANEOUS | Status: DC
  Administered 2013-03-08 – 2013-03-09 (×5): 1 [IU] via SUBCUTANEOUS

## 2013-03-08 NOTE — Progress Notes (Signed)
Subjective:  Patient more alert and awake. Denies any chest pain or shortness of breath. BP remains labile on dopamine urine cultures still pending. Chest x-ray showed improvement in right pleural effusion and no obvious infiltrate  Objective:  Vital Signs in the last 24 hours: Temp:  [97.8 F (36.6 C)-100.7 F (38.2 C)] 97.8 F (36.6 C) (10/01 0729) Pulse Rate:  [81-114] 114 (10/01 0810) Resp:  [7-33] 19 (10/01 0810) BP: (78-122)/(43-74) 116/53 mmHg (10/01 0810) SpO2:  [91 %-100 %] 98 % (10/01 0810) Weight:  [87.862 kg (193 lb 11.2 oz)] 87.862 kg (193 lb 11.2 oz) (10/01 0500)  Intake/Output from previous day: 09/30 0701 - 10/01 0700 In: 1607.2 [P.O.:500; I.V.:519.7; IV Piggyback:587.5] Out: -  Intake/Output from this shift: Total I/O In: 32.4 [I.V.:32.4] Out: -   Physical Exam: Neck: no adenopathy, no carotid bruit, no JVD and supple, symmetrical, trachea midline Lungs: Decreased breath sound at bases with faint rales Heart:  Tachycardic S1-S2 soft there is soft systolic murmur and S3 gallop Abdomen: soft, non-tender; bowel sounds normal; no masses,  no organomegaly Extremities: No clubbing cyanosis 2+ edema  Lab Results:  Recent Labs  03/06/13 2300  WBC 7.0  HGB 10.7*  PLT 187    Recent Labs  03/06/13 2300 03/07/13 0545  NA 146* 148*  K 3.0* 3.4*  CL 110 110  CO2 28 28  GLUCOSE 179* 123*  BUN 14 15  CREATININE 1.43* 1.61*    Recent Labs  03/06/13 2300 03/07/13 0545  TROPONINI <0.30 <0.30   Hepatic Function Panel  Recent Labs  03/06/13 2300  PROT 5.9*  ALBUMIN 2.5*  AST 12  ALT <5  ALKPHOS 63  BILITOT 0.3   No results found for this basename: CHOL,  in the last 72 hours No results found for this basename: PROTIME,  in the last 72 hours  Imaging: Imaging results have been reviewed and Dg Chest 2 View  03/06/2013   *RADIOLOGY REPORT*  Clinical Data: Nausea, emesis, weakness  CHEST - 2 VIEW  Comparison: 01/24/2013  Findings: The heart size  and vascular pattern are normal.  The left lung is clear.  On the right, there is a moderate pleural effusion. Prior studies performed 01/24/2013 and 06-19-2012 show chronic right pleural thickening, but this has significantly increased when compared to prior study.  IMPRESSION: Superimposed on chronic right pleural thickening, there is a moderate right pleural effusion.   Original Report Authenticated By: Esperanza Heir, M.D.   Dg Chest Port 1 View  03/07/2013   CLINICAL DATA:  Rule out pneumonia. Shortness of breath.  EXAM: PORTABLE CHEST - 1 VIEW  COMPARISON:  03/06/2013  FINDINGS: Fissure rotated minimally right. Normal heart size for level of inspiration. No definite pleural fluid identified (previous pleural fluid most readily apparent on the lateral view). No pneumothorax. Low lung volumes with resultant pulmonary interstitial prominence. Mild retrocardiac left lower lobe atelectasis.  IMPRESSION: No evidence of pneumonia.  Improved to resolved right-sided pleural effusion.  Low lung volumes.   Electronically Signed   By: Jeronimo Greaves   On: 03/07/2013 14:58    Cardiac Studies:  Assessment/Plan:  Status post hypotensive shock probably secondary to meds rule out sepsis questionable adrenal insufficiency Acute decompensated diastolic heart failure  Uncontrolled hypertension  Generalized weakness/deconditioning  Insulin requiring diabetes mellitus  Morbid obesity  Acute on Chronic kidney disease stage IV  Anemia  Plan Check 2-D echo Check labs in a.m. Wean off dopamine as blood pressure tolerates check serum cortisol level  LOS: 2 days    [name] N 03/08/2013, 11:56 AM

## 2013-03-08 NOTE — Progress Notes (Signed)
Utilization Review Completed.  

## 2013-03-08 NOTE — Procedures (Signed)
PROCEDURE NOTE: L IJ CVL PLACEMENT  INDICATION:    Monitoring of central venous pressures and/or administration of medications optimally administered in central vein  CONSENT:   Risks of procedure as well as the alternatives were explained to the patient or surrogate. Consent for procedure obtained. A time out was performed to review patient identification, procedure to be performed, correct patient position, medications/allergies/relevent history, required imaging and test results.  PROCEDURE  Maximum sterile technique was used including antiseptics, cap, gloves, gown, hand hygiene, mask and sheet.  Skin prep: Chlorhexidine; local anesthetic administered  After several unsuccessful attempts to cannulate the L [place] vein, a triple lumen catheter was placed in the L IJ vein using the Seldinger technique.  Ultrasound was used for vessel identification and guidance.   EVALUATION:  Blood flow good  Complications: No apparent complications  Patient tolerated the procedure well.  Chest X-ray ordered to verify placement and is pending   Billy Fischer, MD PCCM service Mobile [phone]

## 2013-03-08 NOTE — Progress Notes (Signed)
CRITICAL VALUE ALERT  Critical value received:  Troponin-8.54  Date of notification:  03/08/2013  Time of notification:  1844  Critical value read back:yes  Nurse who received alert:  Carlos American  MD notified (1st page):  Harwani  Time of first page:  1844  MD notified (2nd page):  Time of second page:  Responding MD:  Sharyn Lull  Time MD responded:  [id number]

## 2013-03-08 NOTE — Progress Notes (Signed)
ANTICOAGULATION CONSULT NOTE - Initial Consult  Pharmacy Consult for Heparin  Indication: elevated Troponins  Allergies  Allergen Reactions  . Ancef [Cefazolin]     rash  . Sulfa Antibiotics Rash    Patient Measurements: Height: 5\' 11"  (180.3 cm) Weight: 193 lb 11.2 oz (87.862 kg) IBW/kg (Calculated) : 75.3 Heparin dosing weight: 87.8 kg  Vital Signs: Temp: 98.5 F (36.9 C) (10/01 1526) Temp src: Oral (10/01 1526) BP: 113/65 mmHg (10/01 1845) Pulse Rate: 91 (10/01 1845)  Labs:  Recent Labs  03/06/13 2300 03/07/13 0545 03/08/13 0800 03/08/13 1019 03/08/13 1619  HGB 10.7*  --  10.0*  --   --   HCT 31.2*  --  29.1*  --   --   PLT 187  --  163  --   --   CREATININE 1.43* 1.61* 2.66*  --   --   TROPONINI <0.30 <0.30  --  <0.30 8.54*    Estimated Creatinine Clearance: 28.3 ml/min (by C-G formula based on Cr of 2.66).   Medical History: Past Medical History  Diagnosis Date  . Diabetes mellitus   . Hypertension   . Hyperlipidemia   . CHF (congestive heart failure)   . Enlarged heart   . Pneumonia     hx of     Medications:  Prescriptions prior to admission  Medication Sig Dispense Refill  . acetaminophen (TYLENOL) 500 MG tablet Take 500 mg by mouth every 6 (six) hours as needed for pain.      Marland Kitchen aspirin 81 MG chewable tablet Chew 324 mg by mouth once.      . carvedilol (COREG) 25 MG tablet Take 25 mg by mouth 2 (two) times daily with a meal.      . furosemide (LASIX) 80 MG tablet Take 40-80 mg by mouth 2 (two) times daily. 80mg  in morning and 40mg  at night      . insulin glargine (LANTUS) 100 UNIT/ML injection Inject 26-28 Units into the skin 2 (two) times daily. 26 units at night 28 units in the morning      . lisinopril (PRINIVIL,ZESTRIL) 10 MG tablet Take 10 mg by mouth daily.       . Menthol-Methyl Salicylate (MUSCLE RUB) 10-15 % CREA Apply 1 application topically as needed (neck and shoulder pain).      . Multiple Vitamins-Minerals (CENTRUM SILVER ADULT  50+ PO) Take 0.5 tablets by mouth daily.      . Tamsulosin HCl (FLOMAX) 0.4 MG CAPS Take 1 capsule (0.4 mg total) by mouth daily.  30 capsule  3  . triamcinolone cream (KENALOG) 0.5 % Apply 1 application topically 2 (two) times daily. Back and buttocks and feet       Scheduled:  . aspirin EC  81 mg Oral Daily  . [START ON 03/09/2013] carvedilol  3.125 mg Oral BID WC  . insulin aspart  0-9 Units Subcutaneous Q4H  . pantoprazole  40 mg Oral Q0600  . piperacillin-tazobactam (ZOSYN)  IV  3.375 g Intravenous Q8H  . potassium chloride  20 mEq Oral BID  . sodium chloride  3 mL Intravenous Q12H  . vancomycin  1,500 mg Intravenous Q24H    Assessment: 67 y.o male with pertinent PMH of DM, HTN, CHF, PNA presents with leg pain and weakness acutely worsening today and nausea/vomiting.  US dopplers negative for DVT.   Troponin increased to 8.54.  Pharmacy consulted to start IV heparin for elevated troponin. Platelets 163K, H/H 10.0/29/1.  SCr 2.66.  No bleeding  noted.  Not on anticoagulant medication prior to admission.  H has received SQ heparin 5000 units SQ q8h since admission for VTE prophylaxis. Last SQ heparin dose given today at 14:25.    Goal of Therapy:  Heparin level 0.3-0.7 units/ml Monitor platelets by anticoagulation protocol: Yes   Plan:  Heparin bolus 3000 units IV x1 (lower due to SQ heparin dose given at 14:25). Heparin drip infuse at 1200 units/hr. Check 6 hour heparin level  Daily Heparin level and CBC.    Noah Delaine, RPh Clinical Pharmacist Pager: [phone] 03/08/2013,7:02 PM

## 2013-03-08 NOTE — Progress Notes (Signed)
Pt receiving Dopamine through only PIV. RN walks in room and IV is in bed, hand bleeding. Pt stated he pulled IV out because he didn't want it. IV team notified. 2 IV RNs at bedside. MD Harwani notified. Order for PICC, BP too low for PICC placement per IV RN. MD Simonds notified about central line placement.  1000-IV RN able to place PIV, Dopamine resumed, consent signed by pt's wife for central line placement.  Will cont to monitor pt. Verona Hartshorn L

## 2013-03-08 NOTE — Progress Notes (Signed)
PT Cancellation Note  Patient Details Name: Justin Rylee Sr. MRN: [id number] DOB: 24-Apr-1946   Cancelled Treatment:    Reason Eval/Treat Not Completed: Patient not medically ready; pt BP low, placed on pressors will hold per RN.   Fabio Asa 03/08/2013, 2:28 PM

## 2013-03-08 NOTE — Consult Note (Signed)
PULMONARY  / CRITICAL CARE MEDICINE  Name: Justin Lamountain Sr. MRN: [id number] DOB: 04/08/1946    ADMISSION DATE:  03/06/2013 CONSULTATION DATE:  10/01  REFERRING MD :  Sharyn Lull PRIMARY SERVICE: Harwani  REASON FOR CONSULTATION:  Hypotension, poor venous access  BRIEF PATIENT DESCRIPTION:  36M with hx of diastolic CHF, CRI, DM II, Htn, hyperlipidemia admitted via ED with generalized weakness and treated for decompensated CHF, poorly controlled htn. Transferred to ICU 9/30 with fever and hypotension. PCCM asked to assist with eval and mgmt.  SIGNIFICANT EVENTS / STUDIES:  9/29 Admit with weakness 9/30 fever, hypotension. Transferred to ICU. DA started. Empiric abx started 9/29 LLE venous Doppler: no evidence of DVT 10/01 Echo:    LINES / TUBES: L IJ CVL 10/01 >>   MICRO: PCT  10/01: 16.32  10/02:   10/03:  Urine 9/30 >>  Resp 9/30 >>  Blood 9/30 >>   ANTIBIOTICS: Vanc 9/30 >>  Pip-tazo 9/30 >>   HISTORY OF PRESENT ILLNESS:   36M with hx of diastolic CHF, CRI, DM II, Htn, hyperlipidemia admitted via ED with generalized weakness and treated for decompensated CHF, poorly controlled htn. Transferred to ICU 9/30 with fever and hypotension. He was started on empiric abx and vasopressors. On the morning of this consultation, he lost his peripheral venous access. PICC was requested but could not be placed due to low BP. PCCM asked to assist with eval and mgmt. Pt is poorly oriented and unable to provide much further history. He denies acute pain or dyspnea.   PAST MEDICAL HISTORY :  Past Medical History  Diagnosis Date  . Diabetes mellitus   . Hypertension   . Hyperlipidemia   . CHF (congestive heart failure)   . Enlarged heart   . Pneumonia     hx of   Hx of staph bacteremia   Past Surgical History  Procedure Laterality Date  . Right leg fracture    . Skin graft    . Knee surgery    . Tee without cardioversion  03/17/2012    Procedure: TRANSESOPHAGEAL ECHOCARDIOGRAM  (TEE);  Surgeon: Ricki Rodriguez, MD;  Location[facility] ENDOSCOPY;  Service: Cardiovascular;  Laterality: N/A;   Prior to Admission medications   Medication Sig Start Date End Date Taking? Authorizing Provider  acetaminophen (TYLENOL) 500 MG tablet Take 500 mg by mouth every 6 (six) hours as needed for pain.   Yes Historical Provider, MD  aspirin 81 MG chewable tablet Chew 324 mg by mouth once.   Yes Historical Provider, MD  carvedilol (COREG) 25 MG tablet Take 25 mg by mouth 2 (two) times daily with a meal.   Yes Historical Provider, MD  furosemide (LASIX) 80 MG tablet Take 40-80 mg by mouth 2 (two) times daily. 80mg  in morning and 40mg  at night   Yes Historical Provider, MD  insulin glargine (LANTUS) 100 UNIT/ML injection Inject 26-28 Units into the skin 2 (two) times daily. 26 units at night 28 units in the morning   Yes Historical Provider, MD  lisinopril (PRINIVIL,ZESTRIL) 10 MG tablet Take 10 mg by mouth daily.    Yes Historical Provider, MD  Menthol-Methyl Salicylate (MUSCLE RUB) 10-15 % CREA Apply 1 application topically as needed (neck and shoulder pain).   Yes Historical Provider, MD  Multiple Vitamins-Minerals (CENTRUM SILVER ADULT 50+ PO) Take 0.5 tablets by mouth daily.   Yes Historical Provider, MD  Tamsulosin HCl (FLOMAX) 0.4 MG CAPS Take 1 capsule (0.4 mg total) by mouth daily. 04/01/12  Yes Robynn Pane, MD  triamcinolone cream (KENALOG) 0.5 % Apply 1 application topically 2 (two) times daily. Back and buttocks and feet 04/01/12  Yes Robynn Pane, MD   Allergies  Allergen Reactions  . Ancef [Cefazolin]     rash  . Sulfa Antibiotics Rash   Current meds reviewed in detail  FAMILY HISTORY:  Noncontributory   SOCIAL HISTORY:  reports that he quit smoking about 29 years ago. He has never used smokeless tobacco. He reports that he does not drink alcohol or use illicit drugs. Has been nonambulatory for an undefined period of time due to peripheral neuropathy  REVIEW OF  SYSTEMS:   No CP, cough, hemoptysis, abd pian, N/V/D, dysuria. Has chronic L>R LE edema  SUBJECTIVE:   VITAL SIGNS: Temp:  [97.8 F (36.6 C)-98.5 F (36.9 C)] 97.9 F (36.6 C) (10/01 1210) Pulse Rate:  [81-124] 92 (10/01 1415) Resp:  [7-33] 20 (10/01 1415) BP: (55-121)/(23-74) 105/56 mmHg (10/01 1415) SpO2:  [76 %-100 %] 100 % (10/01 1415) Weight:  [87.862 kg (193 lb 11.2 oz)] 87.862 kg (193 lb 11.2 oz) (10/01 0500) HEMODYNAMICS: CVP:  [2 mmHg-7 mmHg] 3 mmHg VENTILATOR SETTINGS:   INTAKE / OUTPUT: Intake/Output     09/30 0701 - 10/01 0700 10/01 0701 - 10/02 0700   P.O. 500    I.V. (mL/kg) 519.7 (5.9) 120 (1.4)   IV Piggyback 587.5 250   Total Intake(mL/kg) 1607.2 (18.3) 370 (4.2)   Net +1607.2 +370        Urine Occurrence 5 x      PHYSICAL EXAMINATION: General:  Frail, NAD Neuro:  Poorly oriented, pleasant,CNs intact, diffusely weak, absent DTRs in LEs HEENT: NCAT, poor dentition Cardiovascular:  Regular, no M Lungs: CTA anteriorly Abdomen: soft, NT, no masses, diminished BS Ext: dependent edema, 2-3+ L>R LE edema to thighs Skin: no lesions of note  LABS:  CBC Recent Labs     03/06/13  2300  03/08/13  0800  WBC  7.0  8.9  HGB  10.7*  10.0*  HCT  31.2*  29.1*  PLT  187  163   Coag's No results found for this basename: APTT, INR,  in the last 72 hours BMET Recent Labs     03/06/13  2300  03/07/13  0545  03/08/13  0800  NA  146*  148*  146*  K  3.0*  3.4*  4.0  CL  110  110  111  CO2  28  28  26   BUN  14  15  29*  CREATININE  1.43*  1.61*  2.66*  GLUCOSE  179*  123*  162*   Electrolytes Recent Labs     03/06/13  2300  03/07/13  0545  03/08/13  0800  CALCIUM  8.2*  8.5  7.6*  MG  2.0   --    --    Sepsis Markers No results found for this basename: LACTICACIDVEN, PROCALCITON, O2SATVEN,  in the last 72 hours ABG Recent Labs     03/07/13  1250  PHART  7.408  PCO2ART  42.5  PO2ART  159.0*   Liver Enzymes Recent Labs     03/06/13  1800   03/06/13  2300  AST  14  12  ALT  <5  <5  ALKPHOS  78  63  BILITOT  0.5  0.3  ALBUMIN  3.2*  2.5*   Cardiac Enzymes Recent Labs     03/06/13  1800   03/06/13  2300  03/07/13  0545  03/08/13  1019  TROPONINI  <0.30   < >  <0.30  <0.30  <0.30  PROBNP  13246.0*   --   10696.0*   --    --    < > = values in this interval not displayed.   Glucose Recent Labs     03/07/13  1229  03/07/13  1734  03/07/13  2205  03/08/13  0603  03/08/13  0731  03/08/13  1211  GLUCAP  163*  161*  157*  160*  154*  137*    CXR: CM, no overt infiltrates or edema, small bilateral effusions  ASSESSMENT / PLAN:  Principal Problem:   Shock Active Problems:   DIABETES MELLITUS, WITH VASCULAR COMPLICATIONS   diastolic CHF   Chronic hypertension   Acute on chronic renal failure   Hypernatremia   Anasarca   Physical deconditioning   Unable to ambulate   Diabetic peripheral neuropathy   Bilateral leg edema, L>R   History of Bacteremia due to Staphylococcus   Tremor   Hypokalemia   Altered mental status   PULMONARY A: Small bilateral pleural effusions, likely mild CHF P:   Supplemental O2 Monitor resp status Caution with volume resuscitation  CARDIOVASCULAR A: Shock, cardiogenic vs septic. R/O adrenal insufficiency P:  Check cortisol (sent) Monitor CVP - goal 8-12 Albumin ordered for CVP < 8 Change pressors to NE with goal MAP > 65 mmHg Check cortisol (ordered) F/U echo (ordered) Cycle markers  RENAL A:  Acute on chronic renal insuff Hypernatremia, mild Hypokalemia, mild P:   Place Foley to monitor Uo Avoid nephrotoxins IVFs adjusted Monitor BMET intermittently Correct electrolytes as indicated   GASTROINTESTINAL A:  No issues P:   SUP: oral pantoprazole Keep NPO until hemodynamically more stable  HEMATOLOGIC A:  No issues P:  DVT px: SQ heparin Monitor CBC intermittently  INFECTIOUS A:  Suspect severe sepsis H/O staph bacteremia P:   Micro and abx as  above  ENDOCRINE A:  DMII, poorly controlled P:   Change SSI to q 4 hrs while NPO  NEUROLOGIC A:  Mild confusion, likely septic encephalopathy P:   Avoid BZDs Monitor   TODAY'S SUMMARY:   I have personally obtained a history, examined the patient, evaluated laboratory and imaging results, formulated the assessment and plan and placed orders. CRITICAL CARE: The patient is critically ill with multiple organ systems failure and requires high complexity decision making for assessment and support, frequent evaluation and titration of therapies, application of advanced monitoring technologies and extensive interpretation of multiple databases. Critical Care Time devoted to patient care services described in this note is 45 minutes.   Billy Fischer, MD ; [facility] [phone].  After 5:30 PM or weekends, call [phone]  Pulmonary and Critical Care Medicine [facility] Pager: [phone]  03/08/2013, 2:22 PM

## 2013-03-08 NOTE — Progress Notes (Signed)
OT Cancellation Note  Patient Details Name: Justin Mathes Sr. MRN: [id number] DOB: [birth year]   Cancelled Treatment:    Reason Eval/Treat Not Completed: Medical issues which prohibited therapy. Cancelled in AM due to pt receiving central line placement.  Checked back in PM but RN requesting to hold therapy due to BP running low.  Will re-attempt tomorrow as appropriate.  03/08/2013 Cipriano Mile OTR/L Pager [phone] Office [phone]

## 2013-03-08 NOTE — Care Management Note (Signed)
    Page 1 of 1   03/08/2013     11:40:53 AM   CARE MANAGEMENT NOTE 03/08/2013  Patient:  JEMARCUS, DOUGAL   Account Number:  [id number]  Date Initiated:  03/08/2013  Documentation initiated by:  Eutha Cude  Subjective/Objective Assessment:   transferred to 2S 2/2 lethargy and hypotension, started on dopamine gtt     DC Planning Services  CM consult      Per UR Regulation:  Reviewed for med. necessity/level of care/duration of stay  Comments:  03/08/13 1126 Aquilla Voiles RN MSN BSN CCM Pt lives with spouse and grandson, has walker, BSC, hospital bed, and w/c.  Was @ [facility] for rehab last year and discharged home with [facility], then received services from [facility].  Spouse assists with all ADLs and IADLs.

## 2013-03-09 LAB — BASIC METABOLIC PANEL
BUN: 32 mg/dL — ABNORMAL HIGH (ref 6–23)
CO2: 24 mEq/L (ref 19–32)
Calcium: 7.8 mg/dL — ABNORMAL LOW (ref 8.4–10.5)
GFR calc Af Amer: 24 mL/min — ABNORMAL LOW (ref >90)
GFR calc non Af Amer: 21 mL/min — ABNORMAL LOW (ref >90)
Glucose, Bld: 143 mg/dL — ABNORMAL HIGH (ref 70–99)
Potassium: 3.8 mEq/L (ref 3.5–5.1)
Sodium: 145 mEq/L (ref 135–145)

## 2013-03-09 LAB — GLUCOSE, CAPILLARY
Glucose-Capillary: 117 mg/dL — ABNORMAL HIGH (ref 70–99)
Glucose-Capillary: 122 mg/dL — ABNORMAL HIGH (ref 70–99)
Glucose-Capillary: 130 mg/dL — ABNORMAL HIGH (ref 70–99)
Glucose-Capillary: 125 mg/dL — ABNORMAL HIGH (ref 70–99)

## 2013-03-09 LAB — CBC
HCT: 27.3 % — ABNORMAL LOW (ref 39.0–52.0)
Hemoglobin: 9.2 g/dL — ABNORMAL LOW (ref 13.0–17.0)
MCHC: 33.7 g/dL (ref 30.0–36.0)
MCV: 81.3 fL (ref 78.0–100.0)
RBC: 3.36 MIL/uL — ABNORMAL LOW (ref 4.22–5.81)
RDW: 16.1 % — ABNORMAL HIGH (ref 11.5–15.5)
WBC: 8.6 10*3/uL (ref 4.0–10.5)

## 2013-03-09 LAB — HEPATIC FUNCTION PANEL
ALT: 12 U/L (ref 0–53)
AST: 23 U/L (ref 0–37)
Albumin: 2.7 g/dL — ABNORMAL LOW (ref 3.5–5.2)
Alkaline Phosphatase: 70 U/L (ref 39–117)
Indirect Bilirubin: 0.5 mg/dL (ref 0.3–0.9)
Total Bilirubin: 0.9 mg/dL (ref 0.3–1.2)
Total Protein: 5.7 g/dL — ABNORMAL LOW (ref 6.0–8.3)

## 2013-03-09 LAB — URINE CULTURE: Culture: NO GROWTH

## 2013-03-09 LAB — CORTISOL: Cortisol, Plasma: 19.7 ug/dL

## 2013-03-09 LAB — PRO B NATRIURETIC PEPTIDE: Pro B Natriuretic peptide (BNP): 3517 pg/mL — ABNORMAL HIGH (ref 0–125)

## 2013-03-09 LAB — PROCALCITONIN: Procalcitonin: 11.83 ng/mL

## 2013-03-09 LAB — TROPONIN I: Troponin I: <0.3 ng/mL

## 2013-03-09 LAB — HEPARIN LEVEL (UNFRACTIONATED)
Heparin Unfractionated: 0.11 [IU]/mL — ABNORMAL LOW (ref 0.30–0.70)
Heparin Unfractionated: 0.21 IU/mL — ABNORMAL LOW (ref 0.30–0.70)

## 2013-03-09 MED ORDER — INSULIN ASPART 100 UNIT/ML ~~LOC~~ SOLN
0.0000 [IU] | Freq: Every day | SUBCUTANEOUS | Status: DC
Start: 2013-03-09 — End: 2013-03-15

## 2013-03-09 MED ORDER — DIPHENHYDRAMINE HCL 25 MG PO CAPS
25.0000 mg | ORAL_CAPSULE | Freq: Three times a day (TID) | ORAL | Status: DC | PRN
  Administered 2013-03-09 – 2013-03-15 (×5): 25 mg via ORAL
  Filled 2013-03-09 (×5): qty 1

## 2013-03-09 MED ORDER — VANCOMYCIN HCL 10 G IV SOLR
1500.0000 mg | INTRAVENOUS | Status: DC
  Filled 2013-03-09: qty 1500

## 2013-03-09 MED ORDER — ALBUMIN HUMAN 25 % IV SOLN
25.0000 g | Freq: Once | INTRAVENOUS | Status: AC
  Administered 2013-03-09: 25 g via INTRAVENOUS
  Filled 2013-03-09: qty 100

## 2013-03-09 MED ORDER — HEPARIN SODIUM (PORCINE) 5000 UNIT/ML IJ SOLN
5000.0000 [IU] | Freq: Three times a day (TID) | INTRAMUSCULAR | Status: DC
  Administered 2013-03-09 – 2013-03-15 (×17): 5000 [IU] via SUBCUTANEOUS
  Filled 2013-03-09 (×21): qty 1

## 2013-03-09 MED ORDER — INSULIN ASPART 100 UNIT/ML ~~LOC~~ SOLN
0.0000 [IU] | Freq: Three times a day (TID) | SUBCUTANEOUS | Status: DC
  Administered 2013-03-09 – 2013-03-11 (×5): 2 [IU] via SUBCUTANEOUS
  Administered 2013-03-12: 5 [IU] via SUBCUTANEOUS
  Administered 2013-03-12 – 2013-03-14 (×6): 2 [IU] via SUBCUTANEOUS

## 2013-03-09 MED ORDER — TRIAMCINOLONE ACETONIDE 0.025 % EX CREA
TOPICAL_CREAM | Freq: Two times a day (BID) | CUTANEOUS | Status: DC
  Administered 2013-03-09 – 2013-03-14 (×9): via TOPICAL
  Filled 2013-03-09: qty 15

## 2013-03-09 MED ORDER — INSULIN ASPART 100 UNIT/ML ~~LOC~~ SOLN
4.0000 [IU] | Freq: Three times a day (TID) | SUBCUTANEOUS | Status: DC
  Administered 2013-03-09 – 2013-03-10 (×3): 4 [IU] via SUBCUTANEOUS

## 2013-03-09 NOTE — Evaluation (Signed)
Occupational Therapy Evaluation Patient Details Name: Justin Liford Sr. MRN: [id number] DOB: 03-18-1946 Today's Date: 03/09/2013 Time: 5621-3086 OT Time Calculation (min): 16 min  OT Assessment / Plan / Recommendation History of present illness 36M with hx of diastolic CHF, CRI, DM II, Htn, hyperlipidemia admitted via ED with generalized weakness and treated for decompensated CHF, poorly controlled htn. Transferred to ICU 9/30 with fever and hypotension.   Clinical Impression    Pt admitted with above and is generally weak with poor activity tolerance. Will continue to follow acutely in order to address below problem list. Recommending SNF for d/c planning.  OT Assessment  Patient needs continued OT Services    Follow Up Recommendations  SNF    Barriers to Discharge      Equipment Recommendations   (TBD at next venue of care)    Recommendations for Other Services    Frequency  Min 2X/week    Precautions / Restrictions Precautions Precautions: Fall   Pertinent Vitals/Pain See vitals    ADL  Grooming: Performed;Wash/dry hands;Wash/dry face;Teeth care;Supervision/safety;Set up Where Assessed - Grooming: Unsupported sitting Upper Body Bathing: Simulated;Minimal assistance Where Assessed - Upper Body Bathing: Unsupported sitting Lower Body Bathing: Simulated;Moderate assistance Where Assessed - Lower Body Bathing: Supported sit to stand Upper Body Dressing: Simulated;Minimal assistance Where Assessed - Upper Body Dressing: Unsupported sitting Lower Body Dressing: Simulated;Moderate assistance Where Assessed - Lower Body Dressing: Supported sit to Pharmacist, hospital: [employer] Method: Sit to Barista:  (simulated at [employer]) Equipment Used: Gait belt;Rolling walker Transfers/Ambulation Related to ADLs: sit<>stand with min assist ADL Comments: Pt generally weak.  Declining mobility/ambulation due to walking earlier  but agreeable to standing at chair.    OT Diagnosis: Generalized weakness;Acute pain  OT Problem List: Decreased strength;Decreased activity tolerance;Impaired balance (sitting and/or standing);Decreased knowledge of use of DME or AE;Pain OT Treatment Interventions: Self-care/ADL training;Therapeutic exercise;Therapeutic activities;DME and/or AE instruction;Patient/family education;Balance training   OT Goals(Current goals can be found in the care plan section) Acute Rehab OT Goals OT Goal Formulation: With patient Time For Goal Achievement: 03/23/13 Potential to Achieve Goals: Good  Visit Information  Last OT Received On: 03/09/13 Assistance Needed: +2 (bed mobility and safety) History of Present Illness: 36M with hx of diastolic CHF, CRI, DM II, Htn, hyperlipidemia admitted via ED with generalized weakness and treated for decompensated CHF, poorly controlled htn. Transferred to ICU 9/30 with fever and hypotension.       Prior Functioning     Home Living Family/patient expects to be discharged to:: Skilled nursing facility Living Arrangements: Spouse/significant other Prior Function Level of Independence: Needs assistance ADL's / Homemaking Assistance Needed: pt reports he has required assist with all ADLS for the last 3-4 mo was previously independent Communication Communication: No difficulties Dominant Hand: Right         Vision/Perception     Cognition  Cognition Arousal/Alertness: Awake/alert Behavior During Therapy: WFL for tasks assessed/performed Overall Cognitive Status: Impaired/Different from baseline Area of Impairment: Orientation Orientation Level: Situation;Time    Extremity/Trunk Assessment Upper Extremity Assessment Upper Extremity Assessment: RUE deficits/detail;Generalized weakness RUE Deficits / Details: Pt reports pain in R shoulder with movement.  RUE: Unable to fully assess due to pain     Mobility Bed Mobility Bed Mobility: Not  assessed Transfers Transfers: Sit to Stand;Stand to Sit Sit to Stand: 4: Min assist;From chair/3-in-1;With upper extremity assist Stand to Sit: 4: Min assist;To chair/3-in-1;With armrests;With upper extremity assist Details for Transfer Assistance: VCs for hand  placement     Exercise     Balance     End of Session OT - End of Session Equipment Utilized During Treatment: Gait belt;Rolling walker Activity Tolerance: Patient limited by fatigue Patient left: in chair;with call bell/phone within reach  GO   03/09/2013 [name] OTR/L Pager [phone] Office [phone]   [name] 03/09/2013, 5:27 PM

## 2013-03-09 NOTE — Progress Notes (Signed)
ANTICOAGULATION CONSULT NOTE - Follow Up Consult  Pharmacy Consult for Heparin  Indication: elevated Troponins  Allergies  Allergen Reactions  . Ancef [Cefazolin]     rash  . Sulfa Antibiotics Rash    Patient Measurements: Height: 5\' 11"  (180.3 cm) Weight: 193 lb 11.2 oz (87.862 kg) IBW/kg (Calculated) : 75.3 Heparin dosing weight: 87.8 kg  Vital Signs: Temp: 99.2 F (37.3 C) (10/01 2303) Temp src: Oral (10/01 2303) BP: 110/51 mmHg (10/02 0100) Pulse Rate: 85 (10/02 0100)  Labs:  Recent Labs  03/06/13 2300 03/07/13 0545 03/08/13 0800 03/08/13 1019 03/08/13 1619 03/08/13 2210 03/09/13 0200  HGB 10.7*  --  10.0*  --   --   --   --   HCT 31.2*  --  29.1*  --   --   --   --   PLT 187  --  163  --   --   --   --   HEPARINUNFRC  --   --   --   --   --   --  0.11*  CREATININE 1.43* 1.61* 2.66*  --   --   --   --   TROPONINI <0.30 <0.30  --  <0.30 8.54* <0.30  --     Estimated Creatinine Clearance: 28.3 ml/min (by C-G formula based on Cr of 2.66).   Medical History: Past Medical History  Diagnosis Date  . Diabetes mellitus   . Hypertension   . Hyperlipidemia   . CHF (congestive heart failure)   . Enlarged heart   . Pneumonia     hx of     Medications:  Prescriptions prior to admission  Medication Sig Dispense Refill  . acetaminophen (TYLENOL) 500 MG tablet Take 500 mg by mouth every 6 (six) hours as needed for pain.      Marland Kitchen aspirin 81 MG chewable tablet Chew 324 mg by mouth once.      . carvedilol (COREG) 25 MG tablet Take 25 mg by mouth 2 (two) times daily with a meal.      . furosemide (LASIX) 80 MG tablet Take 40-80 mg by mouth 2 (two) times daily. 80mg  in morning and 40mg  at night      . insulin glargine (LANTUS) 100 UNIT/ML injection Inject 26-28 Units into the skin 2 (two) times daily. 26 units at night 28 units in the morning      . lisinopril (PRINIVIL,ZESTRIL) 10 MG tablet Take 10 mg by mouth daily.       . Menthol-Methyl Salicylate (MUSCLE RUB)  10-15 % CREA Apply 1 application topically as needed (neck and shoulder pain).      . Multiple Vitamins-Minerals (CENTRUM SILVER ADULT 50+ PO) Take 0.5 tablets by mouth daily.      . Tamsulosin HCl (FLOMAX) 0.4 MG CAPS Take 1 capsule (0.4 mg total) by mouth daily.  30 capsule  3  . triamcinolone cream (KENALOG) 0.5 % Apply 1 application topically 2 (two) times daily. Back and buttocks and feet       Scheduled:  . aspirin EC  81 mg Oral Daily  . carvedilol  3.125 mg Oral BID WC  . insulin aspart  0-9 Units Subcutaneous Q4H  . pantoprazole  40 mg Oral Q0600  . piperacillin-tazobactam (ZOSYN)  IV  3.375 g Intravenous Q8H  . sodium chloride  3 mL Intravenous Q12H  . vancomycin  1,500 mg Intravenous Q24H    Assessment: 67 y.o male with pertinent PMH of DM, HTN, CHF, PNA presents  with leg pain and weakness acutely worsening today and nausea/vomiting.  US dopplers negative for DVT.   Troponin increased to 8.54.  Pharmacy consulted to start IV heparin for elevated troponin. Platelets 163K, H/H 10.0/29/1.  SCr 2.66.  No bleeding noted.  Not on anticoagulant medication prior to admission.  He has received SQ heparin 5000 units SQ q8h since admission for VTE prophylaxis. Last SQ heparin dose given today at 14:25. His initial heparin level is subtherapeutic, 0.11 units/ml.   Goal of Therapy:  Heparin level 0.3-0.7 units/ml Monitor platelets by anticoagulation protocol: Yes   Plan:  Heparin drip increase to 1400 units/hr. Check 6 hour heparin level  Daily Heparin level and CBC.    Talbert Cage, PharmD Clinical Pharmacist Pager: [phone] 03/09/2013,2:43 AM

## 2013-03-09 NOTE — Progress Notes (Signed)
INITIAL NUTRITION ASSESSMENT  DOCUMENTATION CODES Per approved criteria  -Obesity Unspecified   INTERVENTION: 1. Diet advance as medically able, expect good intake once diet advanced 2. RD will continue to follow  NUTRITION DIAGNOSIS: Inadequate oral intake  related to N/V as evidenced by pt report.   Goal: PO intake to meet >/=90% estimated nutrition needs.   Monitor:  PO intake, weight trends, labs   Reason for Assessment: Malnutrition Screening Tool  67 y.o. male  Admitting Dx: Shock  ASSESSMENT: Pt admitted from home with increased weakness, swelling in lowe extremity and N/V. Per notes, pt wife recently reduced pt's lasix dose. After admission pt required transfer to ICU for fever and hypotension.    Pt states he was N/V prior to admission for several days. Thinks he has lost some weight but unsure of amount. Unsure of his usual weight. Hungry at this time, requesting a diet but RN continues to educate pt that a diet will not be ordered until cleared by MD.   Height: Ht Readings from Last 1 Encounters:  03/06/13 5\' 11"  (1.803 m)    Weight: Wt Readings from Last 1 Encounters:  03/09/13 234 lb 9.1 oz (106.4 kg)    Ideal Body Weight: 172 lbs   % Ideal Body Weight: 136%  Wt Readings from Last 10 Encounters:  03/09/13 234 lb 9.1 oz (106.4 kg)  06/06/12 227 lb 4.7 oz (103.1 kg)  05/27/12 238 lb 6.4 oz (108.138 kg)  04/01/12 251 lb 12.3 oz (114.2 kg)  04/01/12 251 lb 12.3 oz (114.2 kg)  04/01/12 251 lb 12.3 oz (114.2 kg)  02/24/12 239 lb 3.2 oz (108.5 kg)  08/07/11 238 lb 8.6 oz (108.2 kg)    Usual Body Weight: unsure, 250 lbs about 1 year ago  % Usual Body Weight: 94%  BMI:  Body mass index is 32.73 kg/(m^2). obesity class 1   Estimated Nutritional Needs: Kcal: 2200-2400 Protein: 85-100 gm  Fluid: >/= 1.5 L   Skin: intact   Diet Order: NPO  EDUCATION NEEDS: -No education needs identified at this time   Intake/Output Summary (Last 24 hours) at  03/09/13 0851 Last data filed at 03/09/13 0800  Gross per 24 hour  Intake 2528.06 ml  Output    650 ml  Net 1878.06 ml    Last BM: PTA    Labs:   Recent Labs Lab 03/06/13 2300 03/07/13 0545 03/08/13 0800 03/09/13 0200  NA 146* 148* 146* 145  K 3.0* 3.4* 4.0 3.8  CL 110 110 111 110  CO2 28 28 26 24   BUN 14 15 29* 32*  CREATININE 1.43* 1.61* 2.66* 2.92*  CALCIUM 8.2* 8.5 7.6* 7.8*  MG 2.0  --   --   --   GLUCOSE 179* 123* 162* 143*    CBG (last 3)   Recent Labs  03/08/13 1953 03/08/13 2301 03/09/13 0408  GLUCAP 126* 130* 125*    Scheduled Meds: . aspirin EC  81 mg Oral Daily  . carvedilol  3.125 mg Oral BID WC  . insulin aspart  0-9 Units Subcutaneous Q4H  . pantoprazole  40 mg Oral Q0600  . piperacillin-tazobactam (ZOSYN)  IV  3.375 g Intravenous Q8H  . sodium chloride  3 mL Intravenous Q12H  . vancomycin  1,500 mg Intravenous Q24H    Continuous Infusions: . dextrose 5 % with KCl 20 mEq / L 20 mEq (03/08/13 1147)  . heparin 1,400 Units/hr (03/09/13 0800)  . norepinephrine (LEVOPHED) Adult infusion Stopped (03/09/13 0500)  Past Medical History  Diagnosis Date  . Diabetes mellitus   . Hypertension   . Hyperlipidemia   . CHF (congestive heart failure)   . Enlarged heart   . Pneumonia     hx of     Past Surgical History  Procedure Laterality Date  . Right leg fracture    . Skin graft    . Knee surgery    . Tee without cardioversion  03/17/2012    Procedure: TRANSESOPHAGEAL ECHOCARDIOGRAM (TEE);  Surgeon: Ricki Rodriguez, MD;  Location: [facility] ENDOSCOPY;  Service: Cardiovascular;  Laterality: N/A;    Isabell Jarvis RD, LDN Pager [phone] After Hours pager [phone]

## 2013-03-09 NOTE — Evaluation (Signed)
Physical Therapy Evaluation Patient Details Name: [name]. MRN: [id number] DOB: February 13, 1946 Today's Date: 03/09/2013 Time: 4098-1191 PT Time Calculation (min): 17 min  PT Assessment / Plan / Recommendation History of Present Illness  35M with hx of diastolic CHF, CRI, DM II, Htn, hyperlipidemia admitted via ED with generalized weakness and treated for decompensated CHF, poorly controlled htn. Transferred to ICU 9/30 with fever and hypotension.  Clinical Impression  Pt able to ambulate today farther than he has been recently at home. Pt reports he has required increased assist from wife over the last few months and they were discussing SNF for rehab prior to admission with pt stating he is currently agreeable to that plan. Pt with extensive assist needed with bed mobility but decreased assist for gait and standing transfers. Will follow acutely to maximize mobility, strength, transfers and function to decrease burden of care.     PT Assessment  Patient needs continued PT services    Follow Up Recommendations  SNF;Supervision/Assistance - 24 hour    Does the patient have the potential to tolerate intense rehabilitation      Barriers to Discharge Decreased caregiver support      Equipment Recommendations  None recommended by PT    Recommendations for Other Services OT consult   Frequency Min 3X/week    Precautions / Restrictions Precautions Precautions: Fall Precaution Comments: painful right shoulder with limited ROM   Pertinent Vitals/Pain No pain VSS      Mobility  Bed Mobility Bed Mobility: Rolling Left;Left Sidelying to Sit Rolling Left: 1: +2 Total assist Rolling Left: Patient Percentage: 10% Left Sidelying to Sit: 1: +2 Total assist Left Sidelying to Sit: Patient Percentage: 20% Details for Bed Mobility Assistance: use of pad for transfers as well as max cueing for sequence and assist to rotate body and elevate trunk Transfers Transfers: Sit to Stand;Stand  to Sit Sit to Stand: 4: Min assist;From bed;From elevated surface Stand to Sit: 4: Min assist;To chair/3-in-1;With armrests Details for Transfer Assistance: cueing for hand placement and safety with assist to control descent at pt only reaching back with RUE but required assist to initially place RUE on RW with gait Ambulation/Gait Ambulation/Gait Assistance: 4: Min assist Ambulation Distance (Feet): 60 Feet Assistive device: Rolling walker Ambulation/Gait Assistance Details: cueing for posture, position in RW and safety Gait Pattern: Step-through pattern;Decreased stride length Gait velocity: decreased    Exercises     PT Diagnosis: Difficulty walking;Generalized weakness  PT Problem List: Decreased strength;Decreased activity tolerance;Decreased mobility;Decreased knowledge of use of DME PT Treatment Interventions: Gait training;Functional mobility training;Stair training;Therapeutic activities;Therapeutic exercise;Patient/family education;DME instruction     PT Goals(Current goals can be found in the care plan section) Acute Rehab PT Goals Patient Stated Goal: return to gardening PT Goal Formulation: With patient Time For Goal Achievement: 03/23/13 Potential to Achieve Goals: Fair  Visit Information  Last PT Received On: 03/09/13 Assistance Needed: +2 (bed mobility and safety) History of Present Illness: 35M with hx of diastolic CHF, CRI, DM II, Htn, hyperlipidemia admitted via ED with generalized weakness and treated for decompensated CHF, poorly controlled htn. Transferred to ICU 9/30 with fever and hypotension.       Prior Functioning  Home Living Family/patient expects to be discharged to:: Skilled nursing facility Living Arrangements: Spouse/significant other Prior Function Level of Independence: Needs assistance ADL's / Homemaking Assistance Needed: pt reports he has required assist with all ADLS for the last 3-4 mo was previously  independent Communication Communication: No difficulties Dominant Hand:  Right    Cognition  Cognition Arousal/Alertness: Awake/alert Behavior During Therapy: WFL for tasks assessed/performed Overall Cognitive Status: Impaired/Different from baseline Area of Impairment: Orientation;Problem solving Orientation Level: Situation;Time Problem Solving: Slow processing    Extremity/Trunk Assessment Upper Extremity Assessment Upper Extremity Assessment: Defer to OT evaluation Lower Extremity Assessment Lower Extremity Assessment: Generalized weakness Cervical / Trunk Assessment Cervical / Trunk Assessment: Normal   Balance    End of Session PT - End of Session Equipment Utilized During Treatment: Gait belt Activity Tolerance: Patient limited by fatigue Patient left: in chair;with call bell/phone within reach;with nursing/sitter in room Nurse Communication: Mobility status  GP     [name] 03/09/2013, 9:53 AM Delaney Meigs, PT [phone]

## 2013-03-09 NOTE — Progress Notes (Signed)
CC: 1 YOM with multiple medical problems oresented with generalized weakness, poor appetite and nausea, elevated pro-BNP, and acute on chronic renal failure. BP dropped to 60s this afternoon, and received fluid bolus and dopamine gtt.  AC: Heparin for elevated trop 10/1>10/2 heparin DC'd.  Troponins neg <0.3;  SQ hep resumed vte ppx  ID: Vanc/Zosyn to r/o sepsis. Tm 99.2, WBC wnl. SCr 2.92, CrCl 30 ml/min  9/30 BCx2 - ngtd MRSA PCR neg  CV: HTN, HLD, CHF - ASA. BP was soft- now 138/73, tachy 94  Endo: DM (A1c 6.2), CBGs 125-138  GI/Nutrition: cardiac diet  Neuro:  Renal: Stage IV CKD, SCr worse 2.92 <2.66 <1.61, K 3.8 better  Pulm: 2L [place]  Heme/Onc: CBC stable  PTA Med Issues: , lasix, lisinopril, flomax Best Practices: Hep SQ  Plan: hep dc'd -Vanc 1500 mg IV Q24 h -zosyn 3.375g IV Q 8hrs (4 hr infusion) -f/u cx's are renal fxn -Vanc trough at steady state -Watch K

## 2013-03-09 NOTE — Progress Notes (Signed)
Subjective:  Patient is more alert and awake off pressors. Denies any chest pain or shortness of breath. Troponin I1 set was markedly elevated repeat troponin I 2 sets are negative.   Objective:  Vital Signs in the last 24 hours: Temp:  [97.9 F (36.6 C)-99.2 F (37.3 C)] 98 F (36.7 C) (10/02 0732) Pulse Rate:  [79-124] 94 (10/02 0745) Resp:  [15-24] 21 (10/02 0830) BP: (77-138)/(46-95) 138/73 mmHg (10/02 0830) SpO2:  [91 %-100 %] 100 % (10/02 0830) Weight:  [106.4 kg (234 lb 9.1 oz)] 106.4 kg (234 lb 9.1 oz) (10/02 0500)  Intake/Output from previous day: 10/01 0701 - 10/02 0700 In: 2489 [P.O.:200; I.V.:1339; IV Piggyback:950] Out: 600 [Urine:600] Intake/Output from this shift: Total I/O In: 84 [I.V.:59; IV Piggyback:25] Out: 50 [Urine:50]  Physical Exam: Neck: no adenopathy, no carotid bruit, no JVD and supple, symmetrical, trachea midline Lungs: Decreased breath sound at bases Heart: regular rate and rhythm, S1, S2 normal and Soft systolic murmur and S3 gallop noted Abdomen: soft, non-tender; bowel sounds normal; no masses,  no organomegaly Extremities: No clubbing cyanosis 2+ edema noted  Lab Results:  Recent Labs  03/08/13 0800 03/09/13 0200  WBC 8.9 8.6  HGB 10.0* 9.2*  PLT 163 145*    Recent Labs  03/08/13 0800 03/09/13 0200  NA 146* 145  K 4.0 3.8  CL 111 110  CO2 26 24  GLUCOSE 162* 143*  BUN 29* 32*  CREATININE 2.66* 2.92*    Recent Labs  03/08/13 2210 03/09/13 0908  TROPONINI <0.30 <0.30   Hepatic Function Panel  Recent Labs  03/09/13 0200  PROT 5.7*  ALBUMIN 2.7*  AST 23  ALT 12  ALKPHOS 70  BILITOT 0.9  BILIDIR 0.4*  IBILI 0.5   No results found for this basename: CHOL,  in the last 72 hours No results found for this basename: PROTIME,  in the last 72 hours  Imaging: Imaging results have been reviewed and Dg Chest Port 1 View  03/08/2013   CLINICAL DATA:  Central catheter placement  EXAM: PORTABLE CHEST - 1 VIEW  COMPARISON:   03/07/2013  FINDINGS: Left jugular catheter tip in the right atrium. Recommend withdrawal approximately 2 cm. Negative for pneumothorax  Cardiac enlargement without heart failure. Small pleural effusions bilaterally.  IMPRESSION: Central venous catheter tip in the right atrium, withdraw 2 cm.   Electronically Signed   By: Marlan Palau M.D.   On: 03/08/2013 14:24   Dg Chest Port 1 View  03/07/2013   CLINICAL DATA:  Rule out pneumonia. Shortness of breath.  EXAM: PORTABLE CHEST - 1 VIEW  COMPARISON:  03/06/2013  FINDINGS: Fissure rotated minimally right. Normal heart size for level of inspiration. No definite pleural fluid identified (previous pleural fluid most readily apparent on the lateral view). No pneumothorax. Low lung volumes with resultant pulmonary interstitial prominence. Mild retrocardiac left lower lobe atelectasis.  IMPRESSION: No evidence of pneumonia.  Improved to resolved right-sided pleural effusion.  Low lung volumes.   Electronically Signed   By: Jeronimo Greaves   On: 03/07/2013 14:58    Cardiac Studies:  Assessment/Plan:  Status post septic shock questionable source Resolving Acute decompensated diastolic heart failure  Uncontrolled hypertension  Generalized weakness/deconditioning  Insulin requiring diabetes mellitus  Morbid obesity  Acute on Chronic kidney disease stage IV  Anemia  Hypoalbuminemia Thrombocytopenia Plan As per orders  LOS: 3 days    [name] N 03/09/2013, 10:54 AM

## 2013-03-09 NOTE — Progress Notes (Signed)
*  PRELIMINARY RESULTS* Echocardiogram 2D Echocardiogram has been performed.  Jeryl Columbia 03/09/2013, 2:45 PM

## 2013-03-09 NOTE — Progress Notes (Signed)
ANTIBIOTIC CONSULT NOTE - Follow up  Pharmacy Consult for Vancomycin and zosyn Indication: suspected severe sepsis; h/o staph bacteremia  Allergies  Allergen Reactions  . Ancef [Cefazolin]     rash  . Sulfa Antibiotics Rash    Patient Measurements: Height: 5\' 11"  (180.3 cm) Weight: 234 lb 9.1 oz (106.4 kg) IBW/kg (Calculated) : 75.3 Vital Signs: Temp: 98 F (36.7 C) (10/02 0732) Temp src: Oral (10/02 0732) BP: 138/73 mmHg (10/02 0830) Pulse Rate: 94 (10/02 0745) Intake/Output from previous day: 10/01 0701 - 10/02 0700 In: 2489 [P.O.:200; I.V.:1339; IV Piggyback:950] Out: 600 [Urine:600] Intake/Output from this shift: Total I/O In: 84 [I.V.:59; IV Piggyback:25] Out: 50 [Urine:50]  Labs:  Recent Labs  03/06/13 2300 03/07/13 0545 03/08/13 0800 03/09/13 0200  WBC 7.0  --  8.9 8.6  HGB 10.7*  --  10.0* 9.2*  PLT 187  --  163 145*  CREATININE 1.43* 1.61* 2.66* 2.92*   Estimated Creatinine Clearance: 30 ml/min (by C-G formula based on Cr of 2.92). No results found for this basename: VANCOTROUGH, Leodis Binet, VANCORANDOM, GENTTROUGH, GENTPEAK, GENTRANDOM, TOBRATROUGH, TOBRAPEAK, TOBRARND, AMIKACINPEAK, AMIKACINTROU, AMIKACIN,  in the last 72 hours   Microbiology: Recent Results (from the past 720 hour(s))  MRSA PCR SCREENING     Status: None   Collection Time    03/07/13  2:02 PM      Result Value Range Status   MRSA by PCR NEGATIVE  NEGATIVE Final   Comment:            The GeneXpert MRSA Assay (FDA     approved for NASAL specimens     only), is one component of a     comprehensive MRSA colonization     surveillance program. It is not     intended to diagnose MRSA     infection nor to guide or     monitor treatment for     MRSA infections.  CULTURE, BLOOD (ROUTINE X 2)     Status: None   Collection Time    03/07/13  3:00 PM      Result Value Range Status   Specimen Description BLOOD LEFT ARM   Final   Special Requests BOTTLES DRAWN AEROBIC AND ANAEROBIC 10CC    Final   Culture  Setup Time     Final   Value: 03/07/2013 20:50     Performed at [facility]   Culture     Final   Value:        BLOOD CULTURE RECEIVED NO GROWTH TO DATE CULTURE WILL BE HELD FOR 5 DAYS BEFORE ISSUING A FINAL NEGATIVE REPORT     Performed at [facility]   Report Status PENDING   Incomplete  CULTURE, BLOOD (ROUTINE X 2)     Status: None   Collection Time    03/07/13  3:15 PM      Result Value Range Status   Specimen Description BLOOD RIGHT HAND   Final   Special Requests BOTTLES DRAWN AEROBIC ONLY [facility]   Final   Culture  Setup Time     Final   Value: 03/07/2013 20:50     Performed at [facility]   Culture     Final   Value:        BLOOD CULTURE RECEIVED NO GROWTH TO DATE CULTURE WILL BE HELD FOR 5 DAYS BEFORE ISSUING A FINAL NEGATIVE REPORT     Performed at [facility]   Report Status PENDING  Incomplete    Medical History: Past Medical History  Diagnosis Date  . Diabetes mellitus   . Hypertension   . Hyperlipidemia   . CHF (congestive heart failure)   . Enlarged heart   . Pneumonia     hx of     Assessment: Empiric vancomycin and zosyn continue for suspected severe sepsis in this 67 y.o male with h/o staph bacteremia.  Acute on chronic renal insufficiency- SCr has increased 1.61> 2.66, today is 2.92.  Estimated CrCl ~ 30 ml/min. I/O 2457/570 yesterday. Blood cultures no growth to date.    Goal of Therapy:  Vancomycin trough level 15-20 mcg/ml  Plan:  - Adjust Vancomycin to 1500 mg IV Q48 hrs - Continue Zosyn 3.375g IV Q 8hrs (4 hr infusion) - f/u cultures and renal function - Vancomycin trough at steady state.  Noah Delaine, RPh Clinical Pharmacist Pager: [phone]  03/09/2013,11:28 AM

## 2013-03-09 NOTE — Progress Notes (Signed)
PULMONARY  / CRITICAL CARE MEDICINE  Name: Justin Amendola Sr. MRN: [id number] DOB: Sep 18, 1945    ADMISSION DATE:  03/06/2013 CONSULTATION DATE:  10/01  REFERRING MD :  Sharyn Lull PRIMARY SERVICE: Harwani  REASON FOR CONSULTATION:  Hypotension, poor venous access  BRIEF PATIENT DESCRIPTION:  61M with hx of diastolic CHF, CRI, DM II, Htn, hyperlipidemia admitted via ED with generalized weakness and treated for decompensated CHF, poorly controlled htn. Transferred to ICU 9/30 with fever and hypotension. PCCM asked to assist with eval and mgmt.  SIGNIFICANT EVENTS / STUDIES:  9/29 Admit with weakness 9/30 fever, hypotension. Transferred to ICU. DA started. Empiric abx started 9/29 LLE venous Doppler: no evidence of DVT 10/01 Echo: LVEF 50-55%. Grade I diastolic dysfunction. No vegetations   LINES / TUBES: L IJ CVL 10/01 >>   MICRO: PCT  10/01: 16.32  10/02: 11.83  10/03:  Urine 10/01 >> NEG Resp 9/30 >> Cancelled  Blood 9/30 >>   ANTIBIOTICS: Vanc 9/30 >>  Pip-tazo 9/30 >>    SUBJECTIVE:  Feels much better. No distress. No new complaints   VITAL SIGNS: Temp:  [97.8 F (36.6 C)-99.2 F (37.3 C)] 97.9 F (36.6 C) (10/02 2000) Pulse Rate:  [85-107] 105 (10/02 2000) Resp:  [15-30] 19 (10/02 1200) BP: (83-145)/(44-95) 145/68 mmHg (10/02 2000) SpO2:  [95 %-100 %] 97 % (10/02 2000) Weight:  [106.4 kg (234 lb 9.1 oz)] 106.4 kg (234 lb 9.1 oz) (10/02 0500) HEMODYNAMICS: CVP:  [10 mmHg-11 mmHg] 10 mmHg VENTILATOR SETTINGS:   INTAKE / OUTPUT: Intake/Output     10/02 0701 - 10/03 0700   P.O.    I.V. (mL/kg) 456 (4.3)   IV Piggyback 112.5   Total Intake(mL/kg) 568.5 (5.3)   Urine (mL/kg/hr) 430 (0.3)   Total Output 430   Net +138.5         PHYSICAL EXAMINATION: General:  Frail, NAD Neuro:  Diffusely weak, no focal deficits HEENT: NCAT, poor dentition Cardiovascular:  Regular, no M Lungs: CTA anteriorly Abdomen: soft, NT, no masses, diminished BS Ext: dependent  edema, 2-3+ L>R LE edema to thighs Skin: no lesions of note  LABS:  CBC Recent Labs     03/06/13  2300  03/08/13  0800  03/09/13  0200  WBC  7.0  8.9  8.6  HGB  10.7*  10.0*  9.2*  HCT  31.2*  29.1*  27.3*  PLT  187  163  145*   Coag's No results found for this basename: APTT, INR,  in the last 72 hours BMET Recent Labs     03/07/13  0545  03/08/13  0800  03/09/13  0200  NA  148*  146*  145  K  3.4*  4.0  3.8  CL  110  111  110  CO2  28  26  24   BUN  15  29*  32*  CREATININE  1.61*  2.66*  2.92*  GLUCOSE  123*  162*  143*   Electrolytes Recent Labs     03/06/13  2300  03/07/13  0545  03/08/13  0800  03/09/13  0200  CALCIUM  8.2*  8.5  7.6*  7.8*  MG  2.0   --    --    --    Sepsis Markers Recent Labs     03/08/13  1014  03/09/13  0200  PROCALCITON  16.32  11.83   ABG Recent Labs     03/07/13  1250  PHART  7.408  PCO2ART  42.5  PO2ART  159.0*   Liver Enzymes Recent Labs     03/06/13  2300  03/09/13  0200  AST  12  23  ALT  <5  12  ALKPHOS  63  70  BILITOT  0.3  0.9  ALBUMIN  2.5*  2.7*   Cardiac Enzymes Recent Labs     03/06/13  2300   03/08/13  1619  03/08/13  2210  03/09/13  0200  03/09/13  0908  TROPONINI  <0.30   < >  8.54*  <0.30   --   <0.30  PROBNP  10696.0*   --    --    --   3517.0*   --    < > = values in this interval not displayed.   Glucose Recent Labs     03/08/13  1211  03/08/13  1526  03/08/13  1953  03/08/13  2301  03/09/13  0408  03/09/13  1659  GLUCAP  137*  138*  126*  130*  125*  145*    CXR: No new film  ASSESSMENT / PLAN:  PULMONARY A: Small bilateral pleural effusions, likely mild CHF P:   Supplemental O2 Monitor resp status  CARDIOVASCULAR A: Shock, suspect septic, resolved Mildly elevated troponin I X 1 P:  DC hep infusion OK to transfer to SDU  RENAL A:  Acute on chronic renal insuff Hypernatremia, mild Hypokalemia, resolved P:   Monitor BMET intermittently Correct electrolytes  as indicated   GASTROINTESTINAL A:  No issues P:   SUP: oral pantoprazole Begin diet  HEMATOLOGIC A:  No issues P:  DVT px: SQ heparin Monitor CBC intermittently  INFECTIOUS A:  Suspect severe sepsis, source unclear H/O staph bacteremia P:   Micro and abx as above  ENDOCRINE A:  DMII, poorly controlled P:   Change SSI to ACHS  NEUROLOGIC A:  Mild confusion, likely septic encephalopathy, resolved P:   Avoid BZDs Monitor   TODAY'S SUMMARY:   I have personally obtained a history, examined the patient, evaluated laboratory and imaging results, formulated the assessment and plan and placed orders. CRITICAL CARE: The patient is critically ill with multiple organ systems failure and requires high complexity decision making for assessment and support, frequent evaluation and titration of therapies, application of advanced monitoring technologies and extensive interpretation of multiple databases. Critical Care Time devoted to patient care services described in this note is 30 minutes.   Billy Fischer, MD ; [facility] [phone].  After 5:30 PM or weekends, call [phone]  Pulmonary and Critical Care Medicine [facility] Pager: [phone]  03/09/2013, 10:18 PM

## 2013-03-10 DIAGNOSIS — E1159 Type 2 diabetes mellitus with other circulatory complications: Secondary | ICD-10-CM

## 2013-03-10 LAB — GLUCOSE, CAPILLARY
Glucose-Capillary: 146 mg/dL — ABNORMAL HIGH (ref 70–99)
Glucose-Capillary: 150 mg/dL — ABNORMAL HIGH (ref 70–99)
Glucose-Capillary: 117 mg/dL — ABNORMAL HIGH (ref 70–99)
Glucose-Capillary: 119 mg/dL — ABNORMAL HIGH (ref 70–99)

## 2013-03-10 LAB — CBC
HCT: 26.7 % — ABNORMAL LOW (ref 39.0–52.0)
Hemoglobin: 8.8 g/dL — ABNORMAL LOW (ref 13.0–17.0)
MCH: 26.8 pg (ref 26.0–34.0)
MCHC: 33 g/dL (ref 30.0–36.0)
MCV: 81.4 fL (ref 78.0–100.0)
RDW: 16.1 % — ABNORMAL HIGH (ref 11.5–15.5)

## 2013-03-10 LAB — BASIC METABOLIC PANEL
BUN: 32 mg/dL — ABNORMAL HIGH (ref 6–23)
Chloride: 106 mEq/L (ref 96–112)
Creatinine, Ser: 2.64 mg/dL — ABNORMAL HIGH (ref 0.50–1.35)
GFR calc Af Amer: 27 mL/min — ABNORMAL LOW (ref >90)
Glucose, Bld: 132 mg/dL — ABNORMAL HIGH (ref 70–99)
Potassium: 3.5 mEq/L (ref 3.5–5.1)

## 2013-03-10 LAB — PROCALCITONIN: Procalcitonin: 5.94 ng/mL

## 2013-03-10 MED ORDER — LEVOFLOXACIN 500 MG PO TABS
500.0000 mg | ORAL_TABLET | Freq: Every day | ORAL | Status: DC
  Administered 2013-03-10: 500 mg via ORAL
  Filled 2013-03-10: qty 1

## 2013-03-10 MED ORDER — CARVEDILOL 6.25 MG PO TABS
6.2500 mg | ORAL_TABLET | Freq: Two times a day (BID) | ORAL | Status: DC
  Administered 2013-03-10 – 2013-03-11 (×2): 6.25 mg via ORAL
  Filled 2013-03-10 (×4): qty 1

## 2013-03-10 MED ORDER — POTASSIUM CHLORIDE CRYS ER 20 MEQ PO TBCR
40.0000 meq | EXTENDED_RELEASE_TABLET | Freq: Once | ORAL | Status: AC
  Administered 2013-03-10: 40 meq via ORAL
  Filled 2013-03-10: qty 2

## 2013-03-10 MED ORDER — ALBUMIN HUMAN 25 % IV SOLN
25.0000 g | Freq: Once | INTRAVENOUS | Status: DC
  Filled 2013-03-10: qty 100

## 2013-03-10 MED ORDER — LEVOFLOXACIN 250 MG PO TABS
250.0000 mg | ORAL_TABLET | Freq: Every day | ORAL | Status: DC
  Administered 2013-03-11 – 2013-03-14 (×4): 250 mg via ORAL
  Filled 2013-03-10 (×5): qty 1

## 2013-03-10 MED ORDER — FUROSEMIDE 10 MG/ML IJ SOLN
40.0000 mg | Freq: Every day | INTRAMUSCULAR | Status: DC
  Administered 2013-03-10 – 2013-03-11 (×2): 40 mg via INTRAVENOUS
  Filled 2013-03-10 (×3): qty 4

## 2013-03-10 MED ORDER — INSULIN GLARGINE 100 UNIT/ML ~~LOC~~ SOLN
8.0000 [IU] | Freq: Every day | SUBCUTANEOUS | Status: DC
  Administered 2013-03-10 – 2013-03-14 (×4): 8 [IU] via SUBCUTANEOUS
  Filled 2013-03-10 (×6): qty 0.08

## 2013-03-10 MED ORDER — LEVOFLOXACIN 500 MG PO TABS: 500.0000 mg | ORAL_TABLET | Freq: Every day | ORAL | Status: DC

## 2013-03-10 NOTE — Clinical Social Work Note (Signed)
Clinical Social Work Department BRIEF PSYCHOSOCIAL ASSESSMENT 03/10/2013  Patient:  [name], [name]     Account Number:  [id number]     Admit date:  03/06/2013  Clinical Social Worker:  Hulan Fray  Date/Time:  03/10/2013 02:34 PM  Referred by:  Care Management  Date Referred:  03/10/2013 Referred for  SNF Placement   Other Referral:   Interview type:  Patient Other interview type:    PSYCHOSOCIAL DATA Living Status:  WIFE Admitted from facility:   Level of care:   Primary support name:  Lyndal Reggio Primary support relationship to patient:  SPOUSE Degree of support available:   supportive    CURRENT CONCERNS Current Concerns  Post-Acute Placement   Other Concerns:    SOCIAL WORK ASSESSMENT / PLAN Clinical Social Worker received referral for patient needing SNF at discharge. CSW introduced self and explained reason for visit. CSW explained SNF process and patient reported that he was familiar with these facilities. CSW provided SNF list and patient was agreeable for CSW to initate SNF referral in [facility].    CSW will complete FL2 for MD's signature and update patient when bed offers are made.   Assessment/plan status:  Psychosocial Support/Ongoing Assessment of Needs Other assessment/ plan:   Information/referral to community resources:   SNF packet    PATIENT'S/FAMILY'S RESPONSE TO PLAN OF CARE: Patient reported that he was agreeable for CSW to initiate SNF referral in [facility]. Patient was appreciative of CSW's visit and assistance.

## 2013-03-10 NOTE — Progress Notes (Signed)
Subjective:  Patient is any chest pain or shortness of breath more alert and awake. Blood cultures so far negative  Objective:  Vital Signs in the last 24 hours: Temp:  [97.9 F (36.6 C)-99.1 F (37.3 C)] 98.6 F (37 C) (10/03 1058) Pulse Rate:  [87-105] 103 (10/03 1058) Resp:  [20-23] 20 (10/03 1058) BP: (120-156)/(56-85) 149/59 mmHg (10/03 1058) SpO2:  [97 %-100 %] 99 % (10/03 1058) Weight:  [107.6 kg (237 lb 3.4 oz)] 107.6 kg (237 lb 3.4 oz) (10/03 0419)  Intake/Output from previous day: 10/02 0701 - 10/03 0700 In: 1268.5 [I.V.:1056; IV Piggyback:212.5] Out: 1105 [Urine:1105] Intake/Output from this shift: Total I/O In: 100 [I.V.:100] Out: -   Physical Exam: Neck: no adenopathy, no carotid bruit, no JVD and supple, symmetrical, trachea midline Lungs: Decreased breath sounds at bases Heart: regular rate and rhythm, S1, S2 normal and Soft systolic murmur and S3 gallop noted Abdomen: soft, non-tender; bowel sounds normal; no masses,  no organomegaly Extremities: No clubbing cyanosis 2+ edema noted  Lab Results:  Recent Labs  03/09/13 0200 03/10/13 0415  WBC 8.6 6.6  HGB 9.2* 8.8*  PLT 145* 140*    Recent Labs  03/09/13 0200 03/10/13 0415  NA 145 141  K 3.8 3.5  CL 110 106  CO2 24 24  GLUCOSE 143* 132*  BUN 32* 32*  CREATININE 2.92* 2.64*    Recent Labs  03/08/13 2210 03/09/13 0908  TROPONINI <0.30 <0.30   Hepatic Function Panel  Recent Labs  03/09/13 0200  PROT 5.7*  ALBUMIN 2.7*  AST 23  ALT 12  ALKPHOS 70  BILITOT 0.9  BILIDIR 0.4*  IBILI 0.5   No results found for this basename: CHOL,  in the last 72 hours No results found for this basename: PROTIME,  in the last 72 hours  Imaging: Imaging results have been reviewed and No results found.  Cardiac Studies:  Assessment/Plan:  Status post septic shock questionable source  History of staph bacteremia in the past Resolving Acute decompensated diastolic heart failure  Uncontrolled  hypertension  Generalized weakness/deconditioning  Insulin requiring diabetes mellitus  Morbid obesity  Acute on Chronic kidney disease stage IV  Anemia  Hypoalbuminemia  Thrombocytopenia  Plan As per orders Increase Coreg and restart Lasix Albumin 25% 2 bottles today Check labs in a.m. Okay to ambulate as tolerated  LOS: 4 days    Ellen Goris N 03/10/2013, 12:30 PM

## 2013-03-10 NOTE — Clinical Social Work Placement (Addendum)
Clinical Social Work Department CLINICAL SOCIAL WORK PLACEMENT NOTE 03/10/2013  Patient:  [name], [name]  Account Number:  [id number] Admit date:  03/06/2013  Clinical Social Worker:  Hulan Fray  Date/time:  03/10/2013 02:41 PM  Clinical Social Work is seeking post-discharge placement for this patient at the following level of care:   SKILLED NURSING   (*CSW will update this form in Epic as items are completed)   03/10/2013  Patient/family provided with [name] Department of Clinical Social Work's list of facilities offering this level of care within the geographic area requested by the patient (or if unable, by the patient's family).  03/10/2013  Patient/family informed of their freedom to choose among providers that offer the needed level of care, that participate in Medicare, Medicaid or managed care program needed by the patient, have an available bed and are willing to accept the patient.  03/10/2013  Patient/family informed of [facility]' ownership interest in [facility], as well as of the fact that they are under no obligation to receive care at this facility.  PASARR submitted to EDS on 03/22/2012 PASARR number received from [employer] on 03/22/2012  FL2 transmitted to all facilities in geographic area requested by pt/family on  03/10/2013 FL2 transmitted to all facilities within larger geographic area on   Patient informed that his/her managed care company has contracts with or will negotiate with  certain facilities, including the following:     Patient/family informed of bed offers received:   Patient chooses bed at [facility] [place], [place] 03/15/2013) Physician recommends and patient chooses bed at  n/a  Patient to be transferred to  [facility] on  October 29,5621 [place], [place] 03/15/13) Patient to be transferred to facility by [facility] [place], LCSWA 03/15/2013)  The following physician request were  entered in Epic:   Additional Comments:

## 2013-03-10 NOTE — ED Provider Notes (Addendum)
I saw and evaluated the patient, reviewed the resident's note and I agree with the findings and plan. Pt p/w L>R LE swelling and DOE. Labs consistent with CHF exacerbation. Will admit for diuresis  Loren Racer, MD 03/10/13 0226   I have reviewed and agree with resident EKG interpretation.  Loren Racer, MD 03/30/13 [phone]

## 2013-03-10 NOTE — Progress Notes (Signed)
PULMONARY  / CRITICAL CARE MEDICINE  Name: Justin Travelstead Sr. MRN: [id number] DOB: Jul 09, 1945    ADMISSION DATE:  03/06/2013 CONSULTATION DATE:  10/01  REFERRING MD :  Sharyn Lull PRIMARY SERVICE: Harwani  REASON FOR CONSULTATION:  Hypotension, poor venous access  BRIEF PATIENT DESCRIPTION:  79M with hx of diastolic CHF, CRI, DM II, Htn, hyperlipidemia admitted via ED with generalized weakness and treated for decompensated CHF, poorly controlled htn. Transferred to ICU 9/30 with fever and hypotension. PCCM asked to assist with eval and mgmt.  SIGNIFICANT EVENTS / STUDIES:  9/29 Admit with weakness 9/30 fever, hypotension. Transferred to ICU. DA started. Empiric abx started 9/29 LLE venous Doppler: no evidence of DVT 10/01 Echo: LVEF 50-55%. Grade I diastolic dysfunction. No vegetations   LINES / TUBES: L IJ CVL 10/01 >> 10/03  MICRO: PCT  10/01: 16.32  10/02: 11.83  10/03: 5.94 Urine 10/01 >> NEG Resp 9/30 >> Cancelled  Blood 9/30 >> ngtd >>   ANTIBIOTICS: Vanc 9/30 >> 10/03 Pip-tazo 9/30 >> 10/03 Levofloxacin 10/03 (7 days planned)   SUBJECTIVE:  Appears comfortable. No distress. No new complaints   VITAL SIGNS: Temp:  [98 F (36.7 C)-99.1 F (37.3 C)] 98.9 F (37.2 C) (10/03 1850) Pulse Rate:  [87-107] 95 (10/03 1850) Resp:  [20-23] 20 (10/03 1850) BP: (107-156)/(55-83) 147/73 mmHg (10/03 1850) SpO2:  [97 %-100 %] 98 % (10/03 1850) Weight:  [107.1 kg (236 lb 1.8 oz)-107.6 kg (237 lb 3.4 oz)] 107.1 kg (236 lb 1.8 oz) (10/03 1850) HEMODYNAMICS:   VENTILATOR SETTINGS:   INTAKE / OUTPUT: Intake/Output     10/03 0701 - 10/04 0700   P.O. 960   I.V. (mL/kg) 100 (0.9)   IV Piggyback    Total Intake(mL/kg) 1060 (9.9)   Urine (mL/kg/hr) 1100 (0.8)   Total Output 1100   Net -40       Urine Occurrence 1 x   Stool Occurrence 1 x     PHYSICAL EXAMINATION: General:  NAD Neuro:  no focal deficits HEENT: NCAT, poor dentition Cardiovascular:  Regular, no  M Lungs: CTA anteriorly Abdomen: soft, NT, no masses, diminished BS Ext: dependent edema, 2-3+ L>R LE edema to thighs Skin: no lesions of note  LABS:  CBC Recent Labs     03/08/13  0800  03/09/13  0200  03/10/13  0415  WBC  8.9  8.6  6.6  HGB  10.0*  9.2*  8.8*  HCT  29.1*  27.3*  26.7*  PLT  163  145*  140*   Coag's No results found for this basename: APTT, INR,  in the last 72 hours BMET Recent Labs     03/08/13  0800  03/09/13  0200  03/10/13  0415  NA  146*  145  141  K  4.0  3.8  3.5  CL  111  110  106  CO2  26  24  24   BUN  29*  32*  32*  CREATININE  2.66*  2.92*  2.64*  GLUCOSE  162*  143*  132*   Electrolytes Recent Labs     03/08/13  0800  03/09/13  0200  03/10/13  0415  CALCIUM  7.6*  7.8*  7.9*   Sepsis Markers Recent Labs     03/08/13  1014  03/09/13  0200  03/10/13  0415  PROCALCITON  16.32  11.83  5.94   ABG No results found for this basename: PHART, PCO2ART, PO2ART,  in the last 72 hours  Liver Enzymes Recent Labs     03/09/13  0200  AST  23  ALT  12  ALKPHOS  70  BILITOT  0.9  ALBUMIN  2.7*   Cardiac Enzymes Recent Labs     03/08/13  1619  03/08/13  2210  03/09/13  0200  03/09/13  0908  TROPONINI  8.54*  <0.30   --   <0.30  PROBNP   --    --   3517.0*   --    Glucose Recent Labs     03/09/13  0408  03/09/13  1659  03/09/13  2153  03/10/13  0720  03/10/13  1059  03/10/13  1747  GLUCAP  125*  145*  159*  146*  150*  117*    CXR: No new film  ASSESSMENT / PLAN:  PULMONARY A: Small bilateral pleural effusions, likely mild CHF P:   Supplemental O2 as needed to maintain SpO2 > 92%  CARDIOVASCULAR A: Shock, suspect septic, resolved Mildly elevated troponin I X 1 P:  Mgmt per Dr Sharyn Lull  RENAL A:  Acute on chronic renal insuff Hypernatremia, resolved Hypokalemia, resolved P:   Monitor BMET intermittently Correct electrolytes as indicated D/C D5W  GASTROINTESTINAL A:  No issues P:   SUP: not  indicated Cont diet  HEMATOLOGIC A:  No issues P:  DVT px: SQ heparin Monitor CBC intermittently  INFECTIOUS A:  Suspect severe sepsis, source unclear Elevated procalcitonin H/O staph bacteremia P:   Micro and abx as above  ENDOCRINE A:  DMII, improved control P:   Cont SSI ACHS  NEUROLOGIC A:  Mild confusion, likely septic encephalopathy, resolved P:   Avoid BZDs Monitor   TODAY'S SUMMARY:  Discussed with Dr Sharyn Lull. Transfer to tele. PCCM to sign off. Dr Sharyn Lull will continue as primary MD  Billy Fischer, MD ; [facility] [phone].  After 5:30 PM or weekends, call [phone]

## 2013-03-11 LAB — GLUCOSE, CAPILLARY
Glucose-Capillary: 110 mg/dL — ABNORMAL HIGH (ref 70–99)
Glucose-Capillary: 137 mg/dL — ABNORMAL HIGH (ref 70–99)

## 2013-03-11 LAB — PRO B NATRIURETIC PEPTIDE: Pro B Natriuretic peptide (BNP): 22433 pg/mL — ABNORMAL HIGH (ref 0–125)

## 2013-03-11 LAB — CBC
HCT: 25.6 % — ABNORMAL LOW (ref 39.0–52.0)
MCH: 27.5 pg (ref 26.0–34.0)
MCHC: 34 g/dL (ref 30.0–36.0)
Platelets: 147 10*3/uL — ABNORMAL LOW (ref 150–400)
RDW: 15.5 % (ref 11.5–15.5)
WBC: 5.1 10*3/uL (ref 4.0–10.5)

## 2013-03-11 LAB — COMPREHENSIVE METABOLIC PANEL
AST: 11 U/L (ref 0–37)
Albumin: 2.7 g/dL — ABNORMAL LOW (ref 3.5–5.2)
Calcium: 8.1 mg/dL — ABNORMAL LOW (ref 8.4–10.5)
Creatinine, Ser: 2.11 mg/dL — ABNORMAL HIGH (ref 0.50–1.35)
GFR calc non Af Amer: 31 mL/min — ABNORMAL LOW (ref >90)
Sodium: 148 mEq/L — ABNORMAL HIGH (ref 135–145)
Total Protein: 5.7 g/dL — ABNORMAL LOW (ref 6.0–8.3)

## 2013-03-11 MED ORDER — ALBUMIN HUMAN 25 % IV SOLN
25.0000 g | Freq: Once | INTRAVENOUS | Status: AC
  Administered 2013-03-11: 15:00:00 25 g via INTRAVENOUS
  Filled 2013-03-11: qty 100

## 2013-03-11 MED ORDER — SODIUM CHLORIDE 0.9 % IJ SOLN
10.0000 mL | INTRAMUSCULAR | Status: DC | PRN
  Administered 2013-03-12: 05:00:00 10 mL
  Administered 2013-03-12: 30 mL
  Administered 2013-03-13 (×3): 10 mL
  Administered 2013-03-14: 30 mL

## 2013-03-11 MED ORDER — CARVEDILOL 12.5 MG PO TABS
12.5000 mg | ORAL_TABLET | Freq: Two times a day (BID) | ORAL | Status: DC
  Administered 2013-03-11 – 2013-03-15 (×8): 12.5 mg via ORAL
  Filled 2013-03-11 (×10): qty 1

## 2013-03-11 NOTE — Progress Notes (Signed)
Subjective:  Patient denies any chest pain or shortness of breath. States overall feels very weak and has not ambulated yet. Discussed with patient and his wife and agrees for skilled nursing facility/rehabilitation   Objective:  Vital Signs in the last 24 hours: Temp:  [97.4 F (36.3 C)-98.9 F (37.2 C)] 97.4 F (36.3 C) (10/04 0438) Pulse Rate:  [89-107] 94 (10/04 0438) Resp:  [20] 20 (10/04 0438) BP: (107-154)/(55-83) 154/83 mmHg (10/04 0438) SpO2:  [93 %-98 %] 95 % (10/04 0438) Weight:  [106.9 kg (235 lb 10.8 oz)-107.1 kg (236 lb 1.8 oz)] 106.9 kg (235 lb 10.8 oz) (10/04 0438)  Intake/Output from previous day: 10/03 0701 - 10/04 0700 In: 1296 [P.O.:1196; I.V.:100] Out: 1100 [Urine:1100] Intake/Output from this shift: Total I/O In: 480 [P.O.:480] Out: 175 [Urine:175]  Physical Exam: Neck: no adenopathy, no carotid bruit, no JVD and supple, symmetrical, trachea midline Lungs: Decreased breath sound at bases with faint rales Heart: regular rate and rhythm, S1, S2 normal and Soft systolic murmur and S3 gallop noted Abdomen: soft, non-tender; bowel sounds normal; no masses,  no organomegaly Extremities: No clubbing cyanosis 1+ edema noted  Lab Results:  Recent Labs  03/09/13 0200 03/10/13 0415  WBC 8.6 6.6  HGB 9.2* 8.8*  PLT 145* 140*    Recent Labs  03/09/13 0200 03/10/13 0415  NA 145 141  K 3.8 3.5  CL 110 106  CO2 24 24  GLUCOSE 143* 132*  BUN 32* 32*  CREATININE 2.92* 2.64*    Recent Labs  03/08/13 2210 03/09/13 0908  TROPONINI <0.30 <0.30   Hepatic Function Panel  Recent Labs  03/09/13 0200  PROT 5.7*  ALBUMIN 2.7*  AST 23  ALT 12  ALKPHOS 70  BILITOT 0.9  BILIDIR 0.4*  IBILI 0.5   No results found for this basename: CHOL,  in the last 72 hours No results found for this basename: PROTIME,  in the last 72 hours  Imaging: Imaging results have been reviewed and No results found.  Cardiac Studies:  Assessment/Plan:  Status post  septic shock questionable source  History of staph bacteremia in the past  Resolving Acute decompensated diastolic heart failure  Uncontrolled hypertension  Generalized weakness/deconditioning  Insulin requiring diabetes mellitus  Morbid obesity  Acute on Chronic kidney disease stage IV  Anemia  Hypoalbuminemia  Thrombocytopenia  Plan As per orders Social service consult for skilled nursing facility Check labs in a.m.  LOS: 5 days    Charmane Protzman N 03/11/2013, 12:39 PM

## 2013-03-12 ENCOUNTER — Inpatient Hospital Stay (HOSPITAL_COMMUNITY): Payer: Medicare Other

## 2013-03-12 LAB — CBC
MCV: 81.8 fL (ref 78.0–100.0)
Platelets: 148 10*3/uL — ABNORMAL LOW (ref 150–400)
RBC: 3.24 MIL/uL — ABNORMAL LOW (ref 4.22–5.81)
WBC: 5.7 10*3/uL (ref 4.0–10.5)

## 2013-03-12 LAB — COMPREHENSIVE METABOLIC PANEL
ALT: 8 U/L (ref 0–53)
AST: 11 U/L (ref 0–37)
BUN: 28 mg/dL — ABNORMAL HIGH (ref 6–23)
CO2: 27 mEq/L (ref 19–32)
Chloride: 112 mEq/L (ref 96–112)
Creatinine, Ser: 1.91 mg/dL — ABNORMAL HIGH (ref 0.50–1.35)
GFR calc non Af Amer: 34 mL/min — ABNORMAL LOW (ref >90)
Sodium: 148 mEq/L — ABNORMAL HIGH (ref 135–145)
Total Bilirubin: 0.3 mg/dL (ref 0.3–1.2)

## 2013-03-12 LAB — GLUCOSE, CAPILLARY
Glucose-Capillary: 114 mg/dL — ABNORMAL HIGH (ref 70–99)
Glucose-Capillary: 211 mg/dL — ABNORMAL HIGH (ref 70–99)

## 2013-03-12 MED ORDER — ALUM & MAG HYDROXIDE-SIMETH 200-200-20 MG/5ML PO SUSP
30.0000 mL | Freq: Four times a day (QID) | ORAL | Status: DC | PRN
  Administered 2013-03-12: 30 mL via ORAL
  Filled 2013-03-12: qty 30

## 2013-03-12 MED ORDER — ALBUMIN HUMAN 25 % IV SOLN
25.0000 g | Freq: Once | INTRAVENOUS | Status: AC
  Administered 2013-03-12: 25 g via INTRAVENOUS
  Filled 2013-03-12: qty 100

## 2013-03-12 MED ORDER — PANTOPRAZOLE SODIUM 40 MG PO TBEC
40.0000 mg | DELAYED_RELEASE_TABLET | Freq: Every day | ORAL | Status: DC
  Administered 2013-03-12 – 2013-03-14 (×3): 40 mg via ORAL
  Filled 2013-03-12 (×3): qty 1

## 2013-03-12 MED ORDER — FUROSEMIDE 10 MG/ML IJ SOLN
40.0000 mg | Freq: Two times a day (BID) | INTRAMUSCULAR | Status: DC
  Administered 2013-03-12 – 2013-03-14 (×4): 40 mg via INTRAVENOUS
  Filled 2013-03-12 (×5): qty 4

## 2013-03-12 MED ORDER — POTASSIUM CHLORIDE CRYS ER 20 MEQ PO TBCR
20.0000 meq | EXTENDED_RELEASE_TABLET | Freq: Two times a day (BID) | ORAL | Status: DC
  Administered 2013-03-12 – 2013-03-14 (×6): 20 meq via ORAL
  Filled 2013-03-12 (×8): qty 1

## 2013-03-12 NOTE — Progress Notes (Signed)
Subjective:  Patient denies any chest pain or shortness of breath. Still feels overall weak and tired no energy has not gotten out of bed yet  Objective:  Vital Signs in the last 24 hours: Temp:  [97.4 F (36.3 C)-98.7 F (37.1 C)] 97.8 F (36.6 C) (10/05 0615) Pulse Rate:  [87-94] 94 (10/05 0615) Resp:  [20] 20 (10/05 0615) BP: (141-189)/(66-92) 189/92 mmHg (10/05 0615) SpO2:  [95 %-99 %] 99 % (10/05 0615) Weight:  [107 kg (235 lb 14.3 oz)] 107 kg (235 lb 14.3 oz) (10/05 0615)  Intake/Output from previous day: 10/04 0701 - 10/05 0700 In: 1196 [P.O.:1196] Out: 375 [Urine:375] Intake/Output from this shift:    Physical Exam: Neck: no adenopathy, no carotid bruit, no JVD and supple, symmetrical, trachea midline Lungs: Decreased breath sound at bases Heart: regular rate and rhythm, S1, S2 normal and Soft systolic murmur and S3 gallop noted Abdomen: soft, non-tender; bowel sounds normal; no masses,  no organomegaly Extremities: No clubbing cyanosis 2+ edema noted  Lab Results:  Recent Labs  03/11/13 0500 03/12/13 0500  WBC 5.1 5.7  HGB 8.7* 8.9*  PLT 147* 148*    Recent Labs  03/11/13 0500 03/12/13 0500  NA 148* 148*  K 3.6 3.6  CL 113* 112  CO2 26 27  GLUCOSE 130* 136*  BUN 29* 28*  CREATININE 2.11* 1.91*   No results found for this basename: TROPONINI, CK, MB,  in the last 72 hours Hepatic Function Panel  Recent Labs  03/12/13 0500  PROT 5.6*  ALBUMIN 2.5*  AST 11  ALT 8  ALKPHOS 67  BILITOT 0.3   No results found for this basename: CHOL,  in the last 72 hours No results found for this basename: PROTIME,  in the last 72 hours  Imaging: Imaging results have been reviewed and No results found.  Cardiac Studies:  Assessment/Plan:  Status post septic shock questionable source  History of staph bacteremia in the past  Resolving Acute decompensated diastolic heart failure  Uncontrolled hypertension  Generalized weakness/deconditioning  Insulin  requiring diabetes mellitus  Morbid obesity  Acute on Chronic kidney disease stage IV  Anemia  Hypoalbuminemia  Thrombocytopenia  Plan As per orders Out of bed to chair Ambulate with assistance as tolerated Albumin 25% as per orders Increase Lasix as per orders  LOS: 6 days    Mohmed Farver N 03/12/2013, 10:57 AM

## 2013-03-13 LAB — CULTURE, BLOOD (ROUTINE X 2): Culture: NO GROWTH

## 2013-03-13 LAB — GLUCOSE, CAPILLARY
Glucose-Capillary: 136 mg/dL — ABNORMAL HIGH (ref 70–99)
Glucose-Capillary: 149 mg/dL — ABNORMAL HIGH (ref 70–99)
Glucose-Capillary: 137 mg/dL — ABNORMAL HIGH (ref 70–99)

## 2013-03-13 MED ORDER — ALBUMIN HUMAN 25 % IV SOLN
25.0000 g | Freq: Once | INTRAVENOUS | Status: AC
  Administered 2013-03-13: 17:00:00 25 g via INTRAVENOUS
  Filled 2013-03-13: qty 100

## 2013-03-13 NOTE — Progress Notes (Signed)
Occupational Therapy Treatment Patient Details Name: Justin Cortez Sr. MRN: [id number] DOB: [birth year] Today's Date: 03/13/2013 Time: 4098-1191 OT Time Calculation (min): 19 min  OT Assessment / Plan / Recommendation  History of present illness 86M with hx of diastolic CHF, CRI, DM II, Htn, hyperlipidemia admitted via ED with generalized weakness and treated for decompensated CHF, poorly controlled htn. Transferred to ICU 9/30 with fever and hypotension.   OT comments  Pt with fatigue, had just returned to be after lunch.  Agreeable to bed level UE exercises.  Follow Up Recommendations  SNF    Barriers to Discharge       Equipment Recommendations  None recommended by OT    Recommendations for Other Services    Frequency Min 2X/week   Progress towards OT Goals Progress towards OT goals: Progressing toward goals  Plan Discharge plan remains appropriate    Precautions / Restrictions Precautions Precautions: Fall Precaution Comments: painful right shoulder with limited ROM   Pertinent Vitals/Pain VSS, pain with AAROM R shoulder, but resolved with repositioning    ADL  ADL Comments: Pt declined OOB activity.  Agreeable to UE exercises only..    OT Diagnosis:    OT Problem List:   OT Treatment Interventions:     OT Goals(current goals can now be found in the care plan section)    Visit Information  Last OT Received On: 03/13/13 Assistance Needed: +1 History of Present Illness: 86M with hx of diastolic CHF, CRI, DM II, Htn, hyperlipidemia admitted via ED with generalized weakness and treated for decompensated CHF, poorly controlled htn. Transferred to ICU 9/30 with fever and hypotension.    Subjective Data      Prior Functioning       Cognition  Cognition Arousal/Alertness: Awake/alert Behavior During Therapy: WFL for tasks assessed/performed Overall Cognitive Status: Impaired/Different from baseline Problem Solving: Slow processing    Mobility     Exercises  General Exercises - Upper Extremity Shoulder Flexion: AAROM;AROM;Both;15 reps;Supine Shoulder ABduction: AAROM;AROM;Both;15 reps;Supine Elbow Flexion: Strengthening;Both;Supine;Theraband Theraband Level (Elbow Flexion): Level 2 (Red) Elbow Extension: Strengthening;Both;Sidelying;Theraband Theraband Level (Elbow Extension): Level 2 (Red) Wrist Flexion: Both;Strengthening;Supine;Theraband Theraband Level (Wrist Flexion): Level 2 (Red) Wrist Extension: Strengthening;10 reps;Supine;Theraband Theraband Level (Wrist Extension): Level 2 (Red) Digit Composite Flexion: AROM;10 reps;Supine Composite Extension: AROM;10 reps;Supine;Both   Balance     End of Session OT - End of Session Activity Tolerance: Patient limited by fatigue Patient left: in bed;with call bell/phone within reach  GO     Evern Bio 03/13/2013, 3:07 PM [phone]

## 2013-03-13 NOTE — Progress Notes (Signed)
Physical Therapy Treatment Patient Details Name: Justin Koral Sr. MRN: [id number] DOB: [birth year] Today's Date: 03/13/2013 Time: 4098-1191 PT Time Calculation (min): 26 min  PT Assessment / Plan / Recommendation  History of Present Illness 65M with hx of diastolic CHF, CRI, DM II, Htn, hyperlipidemia admitted via ED with generalized weakness and treated for decompensated CHF, poorly controlled htn. Transferred to ICU 9/30 with fever and hypotension.   PT Comments   Pt with improved RUE movement as well as transfers and gait today. Pt able to ambulate 60'x 2 with seated rest between trials and cueing for all transitions with total assist for pericare. Pt states he wears protective briefs at home. Will continue to follow and encouraged OOB for all meals.  Follow Up Recommendations  SNF;Supervision/Assistance - 24 hour     Does the patient have the potential to tolerate intense rehabilitation     Barriers to Discharge        Equipment Recommendations       Recommendations for Other Services    Frequency     Progress towards PT Goals Progress towards PT goals: Progressing toward goals  Plan Current plan remains appropriate    Precautions / Restrictions Precautions Precautions: Fall Precaution Comments: painful right shoulder with limited ROM   Pertinent Vitals/Pain No pain    Mobility  Bed Mobility Bed Mobility: Supine to Sit Rolling Left: 4: Min assist Left Sidelying to Sit: With rails;HOB flat Details for Bed Mobility Assistance: cueing for sequence with min assist to fully elevate trunk Transfers Sit to Stand: From bed;4: Min assist Stand to Sit: To chair/3-in-1;To bed;4: Min guard Details for Transfer Assistance: cueing for hand placement, and sequence x 3 from bed for pericare due to incontinent stool on arrival Ambulation/Gait Ambulation/Gait Assistance: 4: Min assist Ambulation Distance (Feet): 60 Feet (x 2 trials) Assistive device: Rolling walker Ambulation/Gait  Assistance Details: cueing for posture and position in RW as pt maintains flexed posture with self posterior to RW Gait Pattern: Step-through pattern;Decreased stride length;Trunk flexed Gait velocity: decreased Stairs: No    Exercises     PT Diagnosis:    PT Problem List:   PT Treatment Interventions:     PT Goals (current goals can now be found in the care plan section)    Visit Information  Last PT Received On: 03/13/13 Assistance Needed: +1 History of Present Illness: 65M with hx of diastolic CHF, CRI, DM II, Htn, hyperlipidemia admitted via ED with generalized weakness and treated for decompensated CHF, poorly controlled htn. Transferred to ICU 9/30 with fever and hypotension.    Subjective Data      Cognition  Cognition Arousal/Alertness: Awake/alert Behavior During Therapy: WFL for tasks assessed/performed Problem Solving: Slow processing    Balance     End of Session PT - End of Session Equipment Utilized During Treatment: Gait belt Activity Tolerance: Patient tolerated treatment well Patient left: in chair;with call bell/phone within reach;with nursing/sitter in room Nurse Communication: Mobility status   GP     [name] 03/13/2013, 12:47 PM Delaney Meigs, PT [phone]

## 2013-03-13 NOTE — Progress Notes (Signed)
Subjective:  Patient states feels better today, got of bed and walked a few yards todaydenies further abdominal pain.  Denies chest pain or shortness of breath  Objective:  Vital Signs in the last 24 hours: Temp:  [97.9 F (36.6 C)-98.4 F (36.9 C)] 98.2 F (36.8 C) (10/06 1415) Pulse Rate:  [84-93] 88 (10/06 1415) Resp:  [18-20] 20 (10/06 1415) BP: (141-162)/(66-80) 149/76 mmHg (10/06 1415) SpO2:  [90 %-97 %] 97 % (10/06 1415) Weight:  [106.8 kg (235 lb 7.2 oz)] 106.8 kg (235 lb 7.2 oz) (10/06 0619)  Intake/Output from previous day: 10/05 0701 - 10/06 0700 In: 480 [P.O.:480] Out: 850 [Urine:850] Intake/Output from this shift: Total I/O In: 480 [P.O.:480] Out: -   Physical Exam: Neck: no adenopathy, no carotid bruit, no JVD and supple, symmetrical, trachea midline Lungs: decreased breath sounds at bases air entry improved Heart: regular rate and rhythm, S1, S2 normal and soft systolic murmur and S3 gallop noted Abdomen: soft, non-tender; bowel sounds normal; no masses,  no organomegaly Extremities: no clubbing cyanosis 1+ edema noted  Lab Results:  Recent Labs  03/11/13 0500 03/12/13 0500  WBC 5.1 5.7  HGB 8.7* 8.9*  PLT 147* 148*    Recent Labs  03/11/13 0500 03/12/13 0500  NA 148* 148*  K 3.6 3.6  CL 113* 112  CO2 26 27  GLUCOSE 130* 136*  BUN 29* 28*  CREATININE 2.11* 1.91*   No results found for this basename: TROPONINI, CK, MB,  in the last 72 hours Hepatic Function Panel  Recent Labs  03/12/13 0500  PROT 5.6*  ALBUMIN 2.5*  AST 11  ALT 8  ALKPHOS 67  BILITOT 0.3   No results found for this basename: CHOL,  in the last 72 hours No results found for this basename: PROTIME,  in the last 72 hours  Imaging: Imaging results have been reviewed and Dg Abd 1 View  03/12/2013   CLINICAL DATA:  Abdominal pain  EXAM: ABDOMEN - 1 VIEW  COMPARISON:  12/29/2009  FINDINGS: Scattered large and small bowel gas is noted. No abnormal mass or abnormal  calcifications are seen. Fecal material is noted within the colon.  IMPRESSION: No acute abnormality noted.   Electronically Signed   By: Alcide Clever M.D.   On: 03/12/2013 20:16    Cardiac Studies:  Assessment/Plan:  Status post septic shock questionable source  History of staph bacteremia in the past  Resolving Acute decompensated diastolic heart failure  Uncontrolled hypertension  Generalized weakness/deconditioning  Insulin requiring diabetes mellitus  Morbid obesity  Acute on Chronic kidney disease stage IV  Anemia  Hypoalbuminemia  Thrombocytopenia  Plan Continue present management as per orders Increase ambulation Awaiting skilled nursing facility  LOS: 7 days    Katelee Schupp N 03/13/2013, 3:58 PM

## 2013-03-13 NOTE — Progress Notes (Signed)
Pt alert, no c/o pain, pt went for abd xray, pt resting comfortably, will continue to monitor

## 2013-03-14 LAB — COMPREHENSIVE METABOLIC PANEL
AST: 11 U/L (ref 0–37)
Albumin: 2.7 g/dL — ABNORMAL LOW (ref 3.5–5.2)
Alkaline Phosphatase: 53 U/L (ref 39–117)
BUN: 25 mg/dL — ABNORMAL HIGH (ref 6–23)
Chloride: 112 mEq/L (ref 96–112)
Creatinine, Ser: 1.7 mg/dL — ABNORMAL HIGH (ref 0.50–1.35)
GFR calc Af Amer: 46 mL/min — ABNORMAL LOW (ref >90)
Glucose, Bld: 91 mg/dL (ref 70–99)
Potassium: 3.8 mEq/L (ref 3.5–5.1)
Sodium: 147 mEq/L — ABNORMAL HIGH (ref 135–145)
Total Protein: 5.8 g/dL — ABNORMAL LOW (ref 6.0–8.3)

## 2013-03-14 LAB — GLUCOSE, CAPILLARY: Glucose-Capillary: 116 mg/dL — ABNORMAL HIGH (ref 70–99)

## 2013-03-14 LAB — CBC
Hemoglobin: 8.4 g/dL — ABNORMAL LOW (ref 13.0–17.0)
MCHC: 32.8 g/dL (ref 30.0–36.0)
Platelets: 172 10*3/uL (ref 150–400)
RDW: 15.8 % — ABNORMAL HIGH (ref 11.5–15.5)
WBC: 7.3 10*3/uL (ref 4.0–10.5)

## 2013-03-14 LAB — PRO B NATRIURETIC PEPTIDE: Pro B Natriuretic peptide (BNP): 31219 pg/mL — ABNORMAL HIGH (ref 0–125)

## 2013-03-14 MED ORDER — PANTOPRAZOLE SODIUM 40 MG PO TBEC: 40.0000 mg | DELAYED_RELEASE_TABLET | Freq: Every day | ORAL | Status: DC

## 2013-03-14 MED ORDER — LEVOFLOXACIN 250 MG PO TABS: 250.0000 mg | ORAL_TABLET | Freq: Every day | ORAL | Status: DC

## 2013-03-14 MED ORDER — INSULIN GLARGINE 100 UNIT/ML ~~LOC~~ SOLN: 10.0000 [IU] | Freq: Every day | SUBCUTANEOUS | Status: DC

## 2013-03-14 MED ORDER — FUROSEMIDE 80 MG PO TABS
80.0000 mg | ORAL_TABLET | Freq: Two times a day (BID) | ORAL | Status: DC
  Administered 2013-03-14 – 2013-03-15 (×2): 80 mg via ORAL
  Filled 2013-03-14 (×4): qty 1

## 2013-03-14 MED ORDER — POTASSIUM CHLORIDE CRYS ER 20 MEQ PO TBCR: 20.0000 meq | EXTENDED_RELEASE_TABLET | Freq: Two times a day (BID) | ORAL | Status: DC

## 2013-03-14 MED ORDER — ISOSORB DINITRATE-HYDRALAZINE 20-37.5 MG PO TABS: 1.0000 | ORAL_TABLET | Freq: Two times a day (BID) | ORAL | Status: DC

## 2013-03-14 MED ORDER — CARVEDILOL 12.5 MG PO TABS: 12.5000 mg | ORAL_TABLET | Freq: Two times a day (BID) | ORAL | Status: AC

## 2013-03-14 NOTE — Progress Notes (Signed)
Physical Therapy Treatment Patient Details Name: [name]. MRN: [id number] DOB: 05/17/1946 Today's Date: 03/14/2013 Time: 1132-1202 PT Time Calculation (min): 30 min  PT Assessment / Plan / Recommendation  History of Present Illness 54M with hx of diastolic CHF, CRI, DM II, Htn, hyperlipidemia admitted via ED with generalized weakness and treated for decompensated CHF, poorly controlled htn. Transferred to ICU 9/30 with fever and hypotension.   PT Comments   Pt progressing with gait with chair followed for safety and fatigue. Pt required seated rest between trials and cueing to scoot to edge of surface but continues to progress slowly with gait and activity tolerance.   Follow Up Recommendations  SNF;Supervision/Assistance - 24 hour     Does the patient have the potential to tolerate intense rehabilitation     Barriers to Discharge        Equipment Recommendations       Recommendations for Other Services    Frequency     Progress towards PT Goals Progress towards PT goals: Progressing toward goals  Plan Current plan remains appropriate    Precautions / Restrictions Precautions Precautions: Fall Precaution Comments: painful right shoulder with limited ROM   Pertinent Vitals/Pain No pain    Mobility  Bed Mobility Bed Mobility: Rolling Right;Right Sidelying to Sit Rolling Right: 4: Min guard Right Sidelying to Sit: 4: Min assist;HOB flat Details for Bed Mobility Assistance: cueing for sequence with min assist to fully elevate trunk Transfers Sit to Stand: From bed;4: Min assist;From chair/3-in-1 Stand to Sit: To chair/3-in-1;To bed;4: Min guard Details for Transfer Assistance: cueing for hand placement, and sequence x 2 from bed for pericare and from chair after seated rest between ambulation trials Ambulation/Gait Ambulation/Gait Assistance: 4: Min guard Ambulation Distance (Feet): 70 Feet Assistive device: Rolling walker Ambulation/Gait Assistance Details: 27'  x 2 trials with seated rest between, cueing for posture, position in RW and looking up Gait Pattern: Step-through pattern;Decreased stride length;Trunk flexed Gait velocity: decreased Stairs: No    Exercises General Exercises - Lower Extremity Long Arc Quad: AROM;Both;15 reps;Seated Hip Flexion/Marching: AROM;Both;15 reps;Seated   PT Diagnosis:    PT Problem List:   PT Treatment Interventions:     PT Goals (current goals can now be found in the care plan section)    Visit Information  Last PT Received On: 03/14/13 Assistance Needed: +1 History of Present Illness: 54M with hx of diastolic CHF, CRI, DM II, Htn, hyperlipidemia admitted via ED with generalized weakness and treated for decompensated CHF, poorly controlled htn. Transferred to ICU 9/30 with fever and hypotension.    Subjective Data      Cognition  Cognition Arousal/Alertness: Awake/alert Behavior During Therapy: WFL for tasks assessed/performed Problem Solving: Slow processing    Balance     End of Session PT - End of Session Equipment Utilized During Treatment: Gait belt Activity Tolerance: Patient tolerated treatment well Patient left: in chair;with call bell/phone within reach;with nursing/sitter in room;with family/visitor present Nurse Communication: Mobility status   GP     [name] 03/14/2013, 12:32 PM Delaney Meigs, PT [phone]

## 2013-03-14 NOTE — Discharge Summary (Signed)
NAMEGABRIELA, [name]             ACCOUNT NO.:  [id number]  MEDICAL RECORD NO.:  [id number]  LOCATION:  4E07C                        FACILITY:  [facility]  PHYSICIAN:  Eduardo Osier. Sharyn Lull, M.D. DATE OF BIRTH:  1945/10/18  DATE OF ADMISSION:  03/06/2013 DATE OF DISCHARGE:  03/14/2013                              DISCHARGE SUMMARY   ADMITTING DIAGNOSES: 1. Acute decompensated diastolic heart failure. 2. Uncontrolled hypertension. 3. Generalized weakness/deconditioning. 4. Insulin-requiring diabetes mellitus. 5. Morbid obesity. 6. Chronic kidney disease, stage IV. 7. Anemia.  FINAL DIAGNOSES: 1. Status post decompensated diastolic heart failure. 2. Status post septic shock with questionable source, clinically     improved. 3. History of Staph bacteremia in the past. 4. Hypertension. 5. Generalized weakness, deconditioning, improved. 6. Insulin-requiring diabetes mellitus. 7. Morbid obesity. 8. Chronic kidney disease stage IV. 9. Anemia. 10.Hypoalbuminemia. 11.Thrombocytopenia, improved.  DISCHARGE MEDICATIONS: 1. BiDil 20/37.5 mg one tablet twice daily. 2. Levaquin 250 mg one tablet daily for seven more days. 3. Protonix 40 mg 1 tablet daily. 4. Potassium chloride 20 mEq twice daily. 5. Carvedilol 12.5 mg one tablet twice daily. 6. Lantus insulin 10 units daily. 7. Tylenol 500 mg every 6 hours for pain as needed. 8. Aspirin 81 mg one tablet daily. 9. Centrum Silver one tablet daily. 10.Lasix 80 mg in the morning and 40 mg at night. 11.Muscle rub cream apply locally as before. 12.Flomax 0.4 mg one capsule daily. 13.Kenalog cream apply locally as before.  The patient has been     advised to stop lisinopril for now in view of worsening renal     function.  DIET:  Low salt, low cholesterol 1800 calories ADA diet.  INSTRUCTIONS:  The patient has been advised to monitor blood sugar and blood pressure daily.  DISPOSITION:  The patient will be transferred to [facility]  Skilled Nursing Facility/Rehab.  CONDITION AT DISCHARGE:  Stable.  BRIEF HISTORY AND HOSPITAL COURSE:  [name] is a 67 year old male with past medical history significant for multiple medical problems, i.e. hypertension, insulin-requiring diabetes mellitus, history of congestive heart failure secondary to diastolic dysfunction, chronic kidney disease stage IV, morbid obesity, and anemia.  He came to the ER by EMS complaining of generalized weakness, inability to walk, left leg swelling, poor appetite, nausea, and vomiting.  The patient denies any abdominal pain as per wife.  The patient has not been eating for last few days and she is unable to take care of him at home.  The patient denies any chest pain or shortness of breath.  States most of the time he is in bed.  Wife states he has recently reduced his Lasix dose on her own.  The patient was noted to have markedly elevated BNP.  PAST MEDICAL HISTORY:  As above.  PAST SURGICAL HISTORY:  He had right leg fracture in the past, had skin grafting in the past, had knee surgery in the past, also had transesophageal echo in the past which was negative for endocarditis, negative for any vegetations.  PHYSICAL EXAMINATION:  GENERAL:  He was alert and awake. VITAL SIGNS:  Blood pressure was 173/140, pulse was 93, he had low-grade temperature of 99.3, trending down. SKIN:  Pink. NECK:  Supple.  No JVD. LUNGS:  Decreased breath sounds at bases, right more than the left. CARDIOVASCULAR:  S1 and S2 was normal.  There was soft systolic murmur and S4 and S3 gallop noted. EXTREMITIES:  There was no clubbing or cyanosis.  2+ edema left leg and 1+ edema right leg. NEURO:  He was alert, awake, oriented to time, place, and person, otherwise grossly intact.  LABORATORY DATA:  Admission lab, sodium was 147, potassium 3.3, BUN was 13, creatinine 1.37.  His four sets of troponin I were negative. Hemoglobin was 10.7, hematocrit 31.2, white  count of 7.0.  Blood cultures x2 were negative.  DIAGNOSTIC DATA:  Chest x-ray showed chronic right pleural thickening. There is moderate right pleural effusion.  Repeat chest x-ray showed no evidence of pneumonia, improved and resolved right-sided pleural effusion.  BRIEF HOSPITAL COURSE:  The patient was admitted to Telemetry Unit.  MI was ruled out by serial enzymes and EKG.  The patient on 03/06/2013, suddenly became hypotensive and dyspneic requiring transfer to CCU.  Pan cultures were obtained and the patient was started on broad-spectrum antibiotics, and also was started on dopamine.  The patient was noted to be in septic shock.  Dopamine was switched to Neo-Synephrine as the patient became tachycardic with dopamine.  Critical Care consultation was obtained with Dr. Darrol Angel.  His cultures have been negative.  IV antibiotics were switched to p.o. Levaquin and was transferred to the Tele Floor.  The patient's OT and PT consultation was obtained.  The patient ambulated in the room yesterday and in hallway with assistance.  Discussed with his wife at length regarding skilled nursing facility/rehab.  The patient and wife agreed for that as she stated she could not take care of him at home.  The patient has bed offer from [facility].  He will be transferred to [facility] and will be followed up in my office in 1 week.     Eduardo Osier. Sharyn Lull, M.D.     [facility]/MEDQ  D:  03/14/2013  T:  03/14/2013  Job:  [id number]

## 2013-03-14 NOTE — Progress Notes (Signed)
Report called to [facility] center where pt is being d/c to .  Aram Beecham, nurse receiving report verbalized understanding.  Amanda Pea, Charity fundraiser.

## 2013-03-14 NOTE — Progress Notes (Signed)
D/C summary not yet ready as informed by Silvana Newness, S.W. And that if is finished at 7pm pt will have to be d/c'd tomorrow.  Dr. Sharyn Lull informed and instructed if unable to get d/c instructions tonight to hold d/c until tomorrow.  Will continue to monitor.  Amanda Pea, Charity fundraiser.

## 2013-03-14 NOTE — Progress Notes (Signed)
Pt unable to be d/c tonight and placed back on tele monitor.  Christy in cental monitor informed and verified info,  Amanda Pea, RN

## 2013-03-14 NOTE — Discharge Summary (Signed)
  Priority discharge summary dictated on 03/14/2013 dictation number is [phone]

## 2013-03-14 NOTE — Progress Notes (Signed)
Per Dr. Sharyn Lull - patient is medically stable for d/c today and patient/wife accepted a bed at [facility].  Dr.Harwani dictated a d/c summary around 12:30 pm this afternoon but dictated under the old dictation system.  CSW has contacted Medical Records throughout the day in an attempt to secure the d/c summary. Despite their best efforts- have been unable to obtain the summary from the system. CSW spoke with Reyne Dumas- RN Antonieta Pert for [facility]. She stated that they would accept patient at anytime this evening but needed the d/c summary before patient could be admitted. As of the writing of this note- the discharge summary is still not in epic. Thus- will plan d/c to facility tomorrow. Nursing notified. Lorri Frederick. [place]  [phone]

## 2013-03-14 NOTE — Progress Notes (Signed)
Pt IV out due to planned d/c earlier today. IV lasix due this PM. Pt refusing IV. MD notified and ordered to change to 80mg  PO lasix BID. Baron Hamper, RN

## 2013-03-15 LAB — GLUCOSE, CAPILLARY
Glucose-Capillary: 116 mg/dL — ABNORMAL HIGH (ref 70–99)
Glucose-Capillary: 148 mg/dL — ABNORMAL HIGH (ref 70–99)

## 2013-03-15 NOTE — Progress Notes (Signed)
Subjective:  Patient denies any chest pain or shortness of breath blood pressure remains elevated BiDil has been added to discharge meds patient could not be discharged yesterday.  Objective:  Vital Signs in the last 24 hours: Temp:  [97.5 F (36.4 C)-98.9 F (37.2 C)] 97.9 F (36.6 C) (10/08 0623) Pulse Rate:  [82-100] 85 (10/08 0648) Resp:  [17-20] 17 (10/08 0623) BP: (146-193)/(60-106) 186/90 mmHg (10/08 0636) SpO2:  [96 %-100 %] 100 % (10/08 0623) Weight:  [103.5 kg (228 lb 2.8 oz)] 103.5 kg (228 lb 2.8 oz) (10/08 0623)  Intake/Output from previous day: 10/07 0701 - 10/08 0700 In: 600 [P.O.:600] Out: 1300 [Urine:1300] Intake/Output from this shift:    Physical Exam: exam unchanged  Lab Results:  Recent Labs  03/14/13 0540  WBC 7.3  HGB 8.4*  PLT 172    Recent Labs  03/14/13 0540  NA 147*  K 3.8  CL 112  CO2 28  GLUCOSE 91  BUN 25*  CREATININE 1.70*   No results found for this basename: TROPONINI, CK, MB,  in the last 72 hours Hepatic Function Panel  Recent Labs  03/14/13 0540  PROT 5.8*  ALBUMIN 2.7*  AST 11  ALT 5  ALKPHOS 53  BILITOT 0.4   No results found for this basename: CHOL,  in the last 72 hours No results found for this basename: PROTIME,  in the last 72 hours  Imaging: Imaging results have been reviewed and No results found.  Cardiac Studies:  Assessment/Plan:  1. Status post decompensated diastolic heart failure.  2. Status post septic shock with questionable source, clinically  improved.  3. History of Staph bacteremia in the past.  4. Hypertension.  5. Generalized weakness, deconditioning, improved.  6. Insulin-requiring diabetes mellitus.  7. Morbid obesity.  8. Chronic kidney disease stage IV.  9. Anemia.  10.Hypoalbuminemia.  11.Thrombocytopenia, improved. Plan Okay to discharge to skilled nursing facility  LOS: 9 days    Jerami Tammen N 03/15/2013, 9:32 AM

## 2013-03-15 NOTE — Significant Event (Signed)
[name] called report given to nurse Chintwe LPN .

## 2013-03-15 NOTE — Clinical Social Work Placement (Signed)
     Clinical Social Work Department CLINICAL SOCIAL WORK PLACEMENT NOTE 03/15/2013  Patient:  [name], [name]  Account Number:  [id number] Admit date:  03/06/2013  Clinical Social Worker:  Hulan Fray  Date/time:  03/10/2013 02:41 PM  Clinical Social Work is seeking post-discharge placement for this patient at the following level of care:   SKILLED NURSING   (*CSW will update this form in Epic as items are completed)   03/10/2013  Patient/family provided with [name] Department of Clinical Social Works list of facilities offering this level of care within the geographic area requested by the patient (or if unable, by the patients family).  03/10/2013  Patient/family informed of their freedom to choose among providers that offer the needed level of care, that participate in Medicare, Medicaid or managed care program needed by the patient, have an available bed and are willing to accept the patient.  03/10/2013  Patient/family informed of [facility] ownership interest in [facility], as well as of the fact that they are under no obligation to receive care at this facility.  PASARR submitted to EDS on 03/22/2012 PASARR number received from EDS on 03/22/2012  FL2 transmitted to all facilities in geographic area requested by pt/family on  03/10/2013 FL2 transmitted to all facilities within larger geographic area on   Patient informed that his/her managed care company has contracts with or will negotiate with  certain facilities, including the following:     Patient/family informed of bed offers received:  03/14/2013 Patient chooses bed at [facility] [place] Physician recommends and patient chooses bed at    Patient to be transferred to [facility] [place] on  03/15/2013 Patient to be transferred to facility by Ambulance Sharin Mons)  The following physician request were entered in Epic:   Additional Comments: 03/15/13 Patient was  scheduled to go to the SNF on 03/14/13- medically stable per MD- however- we were unable to secure a discharge summary for patient last night. DC summary and arrangements were finally secured today.  Notified SNF, patient, wife and nursing services.  No further CSW needs identified. CSW signing off.  Lorri Frederick. Overton Boggus, LCSWA  [phone]

## 2013-03-15 NOTE — Progress Notes (Signed)
Pt BP 186/90 this AM HR from 80-100. Pt asymptomatic. 8AM coreg and lasix given. Dr. Sharyn Lull notified and ordered to re-check BP in an hour. Baron Hamper, RN

## 2013-03-16 ENCOUNTER — Inpatient Hospital Stay (HOSPITAL_COMMUNITY)
Admission: EM | Admit: 2013-03-16 | Discharge: 2013-03-27 | DRG: 871 | Disposition: A | Discharge status: Skilled Nursing Facility | Payer: Medicare Other | Attending: Cardiovascular Disease | Admitting: Cardiovascular Disease

## 2013-03-16 ENCOUNTER — Encounter (HOSPITAL_COMMUNITY): Payer: Self-pay | Admitting: Emergency Medicine

## 2013-03-16 ENCOUNTER — Emergency Department (HOSPITAL_COMMUNITY): Payer: Medicare Other

## 2013-03-16 DIAGNOSIS — I5033 Acute on chronic diastolic (congestive) heart failure: Secondary | ICD-10-CM | POA: Diagnosis present

## 2013-03-16 DIAGNOSIS — A419 Sepsis, unspecified organism: Principal | ICD-10-CM | POA: Diagnosis present

## 2013-03-16 DIAGNOSIS — N179 Acute kidney failure, unspecified: Secondary | ICD-10-CM | POA: Diagnosis present

## 2013-03-16 DIAGNOSIS — Z6831 Body mass index (BMI) 31.0-31.9, adult: Secondary | ICD-10-CM

## 2013-03-16 DIAGNOSIS — J9811 Atelectasis: Secondary | ICD-10-CM

## 2013-03-16 DIAGNOSIS — I509 Heart failure, unspecified: Secondary | ICD-10-CM | POA: Diagnosis present

## 2013-03-16 DIAGNOSIS — D638 Anemia in other chronic diseases classified elsewhere: Secondary | ICD-10-CM | POA: Diagnosis present

## 2013-03-16 DIAGNOSIS — I129 Hypertensive chronic kidney disease with stage 1 through stage 4 chronic kidney disease, or unspecified chronic kidney disease: Secondary | ICD-10-CM | POA: Diagnosis present

## 2013-03-16 DIAGNOSIS — R918 Other nonspecific abnormal finding of lung field: Secondary | ICD-10-CM

## 2013-03-16 DIAGNOSIS — E119 Type 2 diabetes mellitus without complications: Secondary | ICD-10-CM | POA: Diagnosis present

## 2013-03-16 DIAGNOSIS — J189 Pneumonia, unspecified organism: Secondary | ICD-10-CM | POA: Diagnosis present

## 2013-03-16 DIAGNOSIS — N184 Chronic kidney disease, stage 4 (severe): Secondary | ICD-10-CM | POA: Diagnosis present

## 2013-03-16 DIAGNOSIS — R5381 Other malaise: Secondary | ICD-10-CM | POA: Diagnosis present

## 2013-03-16 DIAGNOSIS — Z87891 Personal history of nicotine dependence: Secondary | ICD-10-CM

## 2013-03-16 DIAGNOSIS — Z79899 Other long term (current) drug therapy: Secondary | ICD-10-CM

## 2013-03-16 DIAGNOSIS — E785 Hyperlipidemia, unspecified: Secondary | ICD-10-CM | POA: Diagnosis present

## 2013-03-16 DIAGNOSIS — Z794 Long term (current) use of insulin: Secondary | ICD-10-CM

## 2013-03-16 DIAGNOSIS — Z7982 Long term (current) use of aspirin: Secondary | ICD-10-CM

## 2013-03-16 DIAGNOSIS — E8809 Other disorders of plasma-protein metabolism, not elsewhere classified: Secondary | ICD-10-CM | POA: Diagnosis not present

## 2013-03-16 LAB — CBC WITH DIFFERENTIAL/PLATELET
Basophils Relative: 0 % (ref 0–1)
Eosinophils Absolute: 0.4 10*3/uL (ref 0.0–0.7)
Hemoglobin: 9.5 g/dL — ABNORMAL LOW (ref 13.0–17.0)
MCH: 27.4 pg (ref 26.0–34.0)
MCHC: 32.2 g/dL (ref 30.0–36.0)
Monocytes Relative: 2 % — ABNORMAL LOW (ref 3–12)
Neutro Abs: 9.5 10*3/uL — ABNORMAL HIGH (ref 1.7–7.7)
Neutrophils Relative %: 88 % — ABNORMAL HIGH (ref 43–77)
Platelets: 229 10*3/uL (ref 150–400)

## 2013-03-16 LAB — URINALYSIS, ROUTINE W REFLEX MICROSCOPIC
Ketones, ur: 15 mg/dL — AB
Nitrite: NEGATIVE
Protein, ur: 100 mg/dL — AB
Specific Gravity, Urine: 1.02 (ref 1.005–1.030)
Urobilinogen, UA: 0.2 mg/dL (ref 0.0–1.0)

## 2013-03-16 LAB — URINE MICROSCOPIC-ADD ON

## 2013-03-16 LAB — SAMPLE TO BLOOD BANK

## 2013-03-16 LAB — PRO B NATRIURETIC PEPTIDE: Pro B Natriuretic peptide (BNP): 20022 pg/mL — ABNORMAL HIGH (ref 0–125)

## 2013-03-16 LAB — BASIC METABOLIC PANEL
BUN: 39 mg/dL — ABNORMAL HIGH (ref 6–23)
Calcium: 8 mg/dL — ABNORMAL LOW (ref 8.4–10.5)
GFR calc Af Amer: 18 mL/min — ABNORMAL LOW (ref >90)
GFR calc non Af Amer: 15 mL/min — ABNORMAL LOW (ref >90)
Glucose, Bld: 148 mg/dL — ABNORMAL HIGH (ref 70–99)
Potassium: 5.7 mEq/L — ABNORMAL HIGH (ref 3.5–5.1)

## 2013-03-16 MED ORDER — VANCOMYCIN HCL IN DEXTROSE 1-5 GM/200ML-% IV SOLN
1000.0000 mg | Freq: Once | INTRAVENOUS | Status: AC
  Administered 2013-03-16: 1000 mg via INTRAVENOUS
  Filled 2013-03-16: qty 200

## 2013-03-16 MED ORDER — SODIUM CHLORIDE 0.9 % IV BOLUS (SEPSIS)
1000.0000 mL | Freq: Once | INTRAVENOUS | Status: AC
  Administered 2013-03-16: 1000 mL via INTRAVENOUS

## 2013-03-16 MED ORDER — PIPERACILLIN-TAZOBACTAM 3.375 G IVPB 30 MIN
3.3750 g | Freq: Once | INTRAVENOUS | Status: AC
  Administered 2013-03-16: 3.375 g via INTRAVENOUS
  Filled 2013-03-16: qty 50

## 2013-03-16 MED ORDER — LEVOFLOXACIN IN D5W 750 MG/150ML IV SOLN: 750.0000 mg | Freq: Once | INTRAVENOUS | Status: DC

## 2013-03-16 MED ORDER — PANTOPRAZOLE SODIUM 40 MG PO TBEC
40.0000 mg | DELAYED_RELEASE_TABLET | Freq: Every day | ORAL | Status: DC
  Administered 2013-03-17 – 2013-03-27 (×11): 40 mg via ORAL
  Filled 2013-03-16 (×11): qty 1

## 2013-03-16 MED ORDER — ASPIRIN 325 MG PO TABS
325.0000 mg | ORAL_TABLET | Freq: Every day | ORAL | Status: DC
  Administered 2013-03-17 – 2013-03-27 (×11): 325 mg via ORAL
  Filled 2013-03-16 (×11): qty 1

## 2013-03-16 NOTE — ED Notes (Signed)
Pt arrives via EMS from [facility]. Pt left hospital today after being treated for septic shock. Family reported that earlier today pt was appropriate and verbal. Around 1300 family noticed a change in LOC. BP 79/38 when EMS arrived. After trendelenberg 80/40. Responsive to verbal stimuil. Sinus on monitor. 2L [place] continuous. CBG 126. Pt had picc line when he was here prior but was removed upon discharge.

## 2013-03-16 NOTE — ED Provider Notes (Addendum)
CSN: [id number]     Arrival date & time 03/16/13  2033 History   First MD Initiated Contact with Patient 03/16/13 2054     Chief Complaint  Patient presents with  . Altered Mental Status   (Consider location/radiation/quality/duration/timing/severity/associated sxs/prior Treatment) The history is provided by the nursing home and the EMS personnel. The history is limited by the condition of the patient.  Jiaire Rosebrook Sr. is a 67 y.o. male hx of DM, HTN, HL, CHF here with hypotension and AMS. Patient was admitted a week ago for hypotension and CHF. He was found to be in severe sepsis and no source was found. His discharge yesterday back to the nursing home. As per family he was very altered today and unresponsive. Blood pressure at the nursing home was 79/38. Patient unresponsive and unable to give any history.   Level V caveat- AMS   Past Medical History  Diagnosis Date  . Diabetes mellitus   . Hypertension   . Hyperlipidemia   . CHF (congestive heart failure)   . Enlarged heart   . Pneumonia     hx of    Past Surgical History  Procedure Laterality Date  . Right leg fracture    . Skin graft    . Knee surgery    . Tee without cardioversion  03/17/2012    Procedure: TRANSESOPHAGEAL ECHOCARDIOGRAM (TEE);  Surgeon: Ricki Rodriguez, MD;  Location: [facility] ENDOSCOPY;  Service: Cardiovascular;  Laterality: N/A;   History reviewed. No pertinent family history. History  Substance Use Topics  . Smoking status: Former Smoker    Quit date: 03/10/1984  . Smokeless tobacco: Never Used  . Alcohol Use: No    Review of Systems  Unable to perform ROS: Mental status change  Neurological: Negative for headaches.    Allergies  Ancef and Sulfa antibiotics  Home Medications   Current Outpatient Rx  Name  Route  Sig  Dispense  Refill  . acetaminophen (TYLENOL) 650 MG suppository   Rectal   Place 650 mg rectally every 4 (four) hours as needed for fever.         Marland Kitchen aspirin 325 MG tablet  Oral   Take 325 mg by mouth daily.         Marland Kitchen aspirin 81 MG tablet   Oral   Take 81 mg by mouth daily.         . carvedilol (COREG) 12.5 MG tablet   Oral   Take 1 tablet (12.5 mg total) by mouth 2 (two) times daily with a meal.   60 tablet   3   . furosemide (LASIX) 40 MG tablet   Oral   Take 40 mg by mouth daily.         . furosemide (LASIX) 80 MG tablet   Oral   Take 80 mg by mouth daily.         . insulin glargine (LANTUS) 100 UNIT/ML injection   Subcutaneous   Inject 10 Units into the skin at bedtime.         . isosorbide-hydrALAZINE (BIDIL) 20-37.5 MG per tablet   Oral   Take 1 tablet by mouth 2 (two) times daily.   60 tablet   3   . levofloxacin (LEVAQUIN) 250 MG tablet   Oral   Take 1 tablet (250 mg total) by mouth daily.   7 tablet   0   . Multiple Vitamins-Minerals (CENTRUM SILVER ADULT 50+ PO)   Oral   Take 0.5 tablets  by mouth daily.         . pantoprazole (PROTONIX) 40 MG tablet   Oral   Take 1 tablet (40 mg total) by mouth daily.   30 tablet   3   . potassium chloride SA (K-DUR,KLOR-CON) 20 MEQ tablet   Oral   Take 1 tablet (20 mEq total) by mouth 2 (two) times daily.   60 tablet   3   . Tamsulosin HCl (FLOMAX) 0.4 MG CAPS   Oral   Take 1 capsule (0.4 mg total) by mouth daily.   30 capsule   3   . tuberculin (APLISOL) 5 UNIT/0.1ML injection   Intradermal   Inject 5 Units into the skin once.          Marland Kitchen acetaminophen (TYLENOL) 500 MG tablet   Oral   Take 500 mg by mouth every 6 (six) hours as needed for pain.         . Menthol-Methyl Salicylate (MUSCLE RUB) 10-15 % CREA   Topical   Apply 1 application topically as needed (neck and shoulder pain).          BP 139/63  Pulse 86  Temp(Src) 98.8 F (37.1 C) (Rectal)  Resp 20  SpO2 89% Physical Exam  Nursing note and vitals reviewed. Constitutional:  Chronically ill, unreponsive. Responds to painful stimuli   HENT:  Head: Normocephalic.  Mouth/Throat: Oropharynx  is clear and moist.  Eyes: Conjunctivae are normal. Pupils are equal, round, and reactive to light.  Neck: Normal range of motion. Neck supple.  Cardiovascular: Normal rate, regular rhythm and normal heart sounds.   Pulmonary/Chest:  Crackles R lung   Abdominal: Soft. Bowel sounds are normal. He exhibits no distension. There is no tenderness. There is no rebound.  Musculoskeletal: Normal range of motion.  1+ edema bilaterally   Neurological:  Lethargic, responds to painful stimuli   Skin: Skin is warm and dry.  Psychiatric:  Unable     ED Course  Procedures (including critical care time)  CRITICAL CARE Performed by: Silverio Lay, Mahiya Kercheval   Total critical care time: 30 min   Critical care time was exclusive of separately billable procedures and treating other patients.  Critical care was necessary to treat or prevent imminent or life-threatening deterioration.  Critical care was time spent personally by me on the following activities: development of treatment plan with patient and/or surrogate as well as nursing, discussions with consultants, evaluation of patient's response to treatment, examination of patient, obtaining history from patient or surrogate, ordering and performing treatments and interventions, ordering and review of laboratory studies, ordering and review of radiographic studies, pulse oximetry and re-evaluation of patient's condition.  Labs Review Labs Reviewed  CBC WITH DIFFERENTIAL - Abnormal; Notable for the following:    WBC 10.8 (*)    RBC 3.47 (*)    Hemoglobin 9.5 (*)    HCT 29.5 (*)    RDW 16.5 (*)    Neutrophils Relative % 88 (*)    Neutro Abs 9.5 (*)    Lymphocytes Relative 7 (*)    Monocytes Relative 2 (*)    All other components within normal limits  BASIC METABOLIC PANEL - Abnormal; Notable for the following:    Potassium 5.7 (*)    Glucose, Bld 148 (*)    BUN 39 (*)    Creatinine, Ser 3.74 (*)    Calcium 8.0 (*)    GFR calc non Af Amer 15 (*)     GFR calc Af Amer 18 (*)  All other components within normal limits  PRO B NATRIURETIC PEPTIDE - Abnormal; Notable for the following:    Pro B Natriuretic peptide (BNP) 20022.0 (*)    All other components within normal limits  CG4 I-STAT (LACTIC ACID) - Abnormal; Notable for the following:    Lactic Acid, Venous 3.01 (*)    All other components within normal limits  CULTURE, BLOOD (ROUTINE X 2)  CULTURE, BLOOD (ROUTINE X 2)  URINE CULTURE  URINALYSIS, ROUTINE W REFLEX MICROSCOPIC  POCT I-STAT TROPONIN I  SAMPLE TO BLOOD BANK   Imaging Review Dg Chest Portable 1 View  03/16/2013   *RADIOLOGY REPORT*  Clinical Data: Fever and shortness of breath; hypotension.  Altered mental status.  PORTABLE CHEST - 1 VIEW  Comparison: Chest radiograph performed 03/08/2013  Findings: There is new consolidation of the right upper lobe. This is most compatible with pneumonia.  An underlying obstructive lesion cannot be entirely excluded, though the recent prior study demonstrate no evidence of hilar prominence.  A small right pleural effusion is again noted; there is no evidence of pneumothorax.  The cardiomediastinal silhouette is borderline normal in size.  No acute osseous abnormalities are identified.  IMPRESSION: New consolidation of the right upper lung lobe, most compatible with pneumonia.  Small right pleural effusion again noted.  An underlying obstructing lesion cannot be entirely excluded; would recommend follow-up CT of the chest after completion of treatment for pneumonia.   Original Report Authenticated By: Tonia Ghent, M.D.    EKG Interpretation   None       Date: 03/16/2013  Rate: 78  Rhythm: normal sinus rhythm  QRS Axis: left  Intervals: normal  ST/T Wave abnormalities: nonspecific ST changes  Conduction Disutrbances:none  Narrative Interpretation: TWI laterally leads new from previous  Old EKG Reviewed: changes noted    MDM  No diagnosis found. Holman Bonsignore Sr. is a 67  y.o. male her with AMS. Hypotensive on arrival. Concerned for possible septic shock. Code sepsis activated.   10:26 PM CXR showed pneumonia, given vanc/zosyn. Lactate elevated. Patient not hypotensive after 2 L NS. I called Dr. Algie Coffer who will admit the patient for severe sepsis from pneumonia.      Richardean Canal, MD 03/16/13 2227  Richardean Canal, MD 03/16/13 2227

## 2013-03-16 NOTE — ED Notes (Signed)
pts wife in room and given update.

## 2013-03-16 NOTE — ED Notes (Signed)
Unable to obtain IV, MD Silverio Lay to bedside to attempt EJ, ultrasound machine to bedside also

## 2013-03-16 NOTE — H&P (Signed)
Justin Barbar Sr. is an 67 y.o. male.   Chief Complaint: Altered mental status (AMS) HPI: The history is limited and provided by the nursing home and the EMS personnel. Justin Maddocks Sr. is a 67 y.o. Male with hx of DM, HTN, HL, CHF here with hypotension and AMS. Patient was admitted a week ago for hypotension and CHF. He was found to be in severe sepsis and no source was found. He discharge yesterday back to the nursing home. As per family he was very altered today and unresponsive. Blood pressure at the nursing home was 79/38. Blood sugar is 140 mg. range Patient unresponsive and unable to give any history. CXR today showed new pneumonia.   Past Medical History  Diagnosis Date  . Diabetes mellitus   . Hypertension   . Hyperlipidemia   . CHF (congestive heart failure)   . Enlarged heart   . Pneumonia     hx of       Past Surgical History  Procedure Laterality Date  . Right leg fracture    . Skin graft    . Knee surgery    . Tee without cardioversion  03/17/2012    Procedure: TRANSESOPHAGEAL ECHOCARDIOGRAM (TEE);  Surgeon: Ricki Rodriguez, MD;  Location: [facility] ENDOSCOPY;  Service: Cardiovascular;  Laterality: N/A;    History reviewed. No pertinent family history. Social History:  reports that he quit smoking about 29 years ago. He has never used smokeless tobacco. He reports that he does not drink alcohol or use illicit drugs.  Allergies:  Allergies  Allergen Reactions  . Ancef [Cefazolin] Itching and Rash    rash  . Sulfa Antibiotics Rash     (Not in a hospital admission)  Results for orders placed during the hospital encounter of 03/16/13 (from the past 48 hour(s))  CBC WITH DIFFERENTIAL     Status: Abnormal   Collection Time    03/16/13  9:12 PM      Result Value Range   WBC 10.8 (*) 4.0 - 10.5 K/uL   RBC 3.47 (*) 4.22 - 5.81 MIL/uL   Hemoglobin 9.5 (*) 13.0 - 17.0 g/dL   HCT 45.4 (*) 09.8 - 11.9 %   MCV 85.0  78.0 - 100.0 fL   MCH 27.4  26.0 - 34.0 pg   MCHC  32.2  30.0 - 36.0 g/dL   RDW 14.7 (*) 82.9 - 56.2 %   Platelets 229  150 - 400 K/uL   Neutrophils Relative % 88 (*) 43 - 77 %   Neutro Abs 9.5 (*) 1.7 - 7.7 K/uL   Lymphocytes Relative 7 (*) 12 - 46 %   Lymphs Abs 0.8  0.7 - 4.0 K/uL   Monocytes Relative 2 (*) 3 - 12 %   Monocytes Absolute 0.2  0.1 - 1.0 K/uL   Eosinophils Relative 3  0 - 5 %   Eosinophils Absolute 0.4  0.0 - 0.7 K/uL   Basophils Relative 0  0 - 1 %   Basophils Absolute 0.0  0.0 - 0.1 K/uL  BASIC METABOLIC PANEL     Status: Abnormal   Collection Time    03/16/13  9:12 PM      Result Value Range   Sodium 145  135 - 145 mEq/L   Potassium 5.7 (*) 3.5 - 5.1 mEq/L   Chloride 109  96 - 112 mEq/L   CO2 24  19 - 32 mEq/L   Glucose, Bld 148 (*) 70 - 99 mg/dL   BUN  39 (*) 6 - 23 mg/dL   Creatinine, Ser 1.47 (*) 0.50 - 1.35 mg/dL   Calcium 8.0 (*) 8.4 - 10.5 mg/dL   GFR calc non Af Amer 15 (*) >90 mL/min   GFR calc Af Amer 18 (*) >90 mL/min   Comment: (NOTE)     The eGFR has been calculated using the CKD EPI equation.     This calculation has not been validated in all clinical situations.     eGFR's persistently <90 mL/min signify possible Chronic Kidney     Disease.  PRO B NATRIURETIC PEPTIDE     Status: Abnormal   Collection Time    03/16/13  9:12 PM      Result Value Range   Pro B Natriuretic peptide (BNP) 20022.0 (*) 0 - 125 pg/mL  SAMPLE TO BLOOD BANK     Status: None   Collection Time    03/16/13  9:12 PM      Result Value Range   Blood Bank Specimen SAMPLE AVAILABLE FOR TESTING     Sample Expiration 03/17/2013    POCT I-STAT TROPONIN I     Status: None   Collection Time    03/16/13  9:12 PM      Result Value Range   Troponin i, poc 0.02  0.00 - 0.08 ng/mL   Comment 3            Comment: Due to the release kinetics of cTnI,     a negative result within the first hours     of the onset of symptoms does not rule out     myocardial infarction with certainty.     If myocardial infarction is still  suspected,     repeat the test at appropriate intervals.  CG4 I-STAT (LACTIC ACID)     Status: Abnormal   Collection Time    03/16/13  9:15 PM      Result Value Range   Lactic Acid, Venous 3.01 (*) 0.5 - 2.2 mmol/L  URINALYSIS, ROUTINE W REFLEX MICROSCOPIC     Status: Abnormal   Collection Time    03/16/13 10:08 PM      Result Value Range   Color, Urine AMBER (*) YELLOW   Comment: BIOCHEMICALS MAY BE AFFECTED BY COLOR   APPearance CLOUDY (*) CLEAR   Specific Gravity, Urine 1.020  1.005 - 1.030   pH 5.0  5.0 - 8.0   Glucose, UA NEGATIVE  NEGATIVE mg/dL   Hgb urine dipstick SMALL (*) NEGATIVE   Bilirubin Urine SMALL (*) NEGATIVE   Ketones, ur 15 (*) NEGATIVE mg/dL   Protein, ur 829 (*) NEGATIVE mg/dL   Urobilinogen, UA 0.2  0.0 - 1.0 mg/dL   Nitrite NEGATIVE  NEGATIVE   Leukocytes, UA SMALL (*) NEGATIVE  URINE MICROSCOPIC-ADD ON     Status: Abnormal   Collection Time    03/16/13 10:08 PM      Result Value Range   Squamous Epithelial / LPF FEW (*) RARE   WBC, UA 3-6  <3 WBC/hpf   RBC / HPF 11-20  <3 RBC/hpf   Bacteria, UA FEW (*) RARE   Casts HYALINE CASTS (*) NEGATIVE   Urine-Other AMORPHOUS URATES/PHOSPHATES     Dg Chest Portable 1 View  03/16/2013   *RADIOLOGY REPORT*  Clinical Data: Fever and shortness of breath; hypotension.  Altered mental status.  PORTABLE CHEST - 1 VIEW  Comparison: Chest radiograph performed 03/08/2013  Findings: There is new consolidation of the right upper lobe.  This is most compatible with pneumonia.  An underlying obstructive lesion cannot be entirely excluded, though the recent prior study demonstrate no evidence of hilar prominence.  A small right pleural effusion is again noted; there is no evidence of pneumothorax.  The cardiomediastinal silhouette is borderline normal in size.  No acute osseous abnormalities are identified.  IMPRESSION: New consolidation of the right upper lung lobe, most compatible with pneumonia.  Small right pleural effusion  again noted.  An underlying obstructing lesion cannot be entirely excluded; would recommend follow-up CT of the chest after completion of treatment for pneumonia.   Original Report Authenticated By: Tonia Ghent, M.D.    ROS as per recent hospital admission. Constitutional: Positive for malaise/fatigue. Negative for fever and chills.  Eyes: Negative for blurred vision and double vision.  Respiratory: Positive for cough. Negative for hemoptysis, sputum production and shortness of breath.  Cardiovascular: Positive for leg swelling. Negative for chest pain, palpitations and orthopnea.  Gastrointestinal: Positive for nausea and vomiting. Negative for abdominal pain, diarrhea and constipation.  Genitourinary: Negative for dysuria.  Musculoskeletal: Positive for myalgias and joint pain.  Neurological: Positive for dizziness and weakness. Negative for headaches.   Blood pressure 82/45, pulse 82, temperature 98.8 F (37.1 C), temperature source Rectal, resp. rate 22, SpO2 100.00%.  Constitutional: He opens his eyes on calling.  HENT: Normocephalic and atraumatic.  Eyes: Conjunctivae are pale pink. Pupils are equal, round, and reactive to light. No scleral icterus.  Neck: Normal range of motion. Neck supple. No JVD present. No tracheal deviation present. No thyromegaly present.  Cardiovascular: Normal rate and regular rhythm.  Murmur (Soft systolic murmur S4 and S3 gallop noted) heard.  Respiratory: Bilateral rhonchi. GI: Soft. Bowel sounds are normal. He exhibits distension. There is no tenderness. There is no rebound and no guarding.  Musculoskeletal: No clubbing cyanosis 1+ edema left leg trace edema right leg.  Neurological: He is poorly responding to calling.   Assessment/Plan Altered mental status Right lung pneumonia Diastolic heart failure Insulin requiring DM, II CKD, stage IV Anemia of chronic disease  Admit Antibiotic IV fluids  Chrystal Zeimet S 03/16/2013, 11:14 PM

## 2013-03-17 ENCOUNTER — Inpatient Hospital Stay (HOSPITAL_COMMUNITY): Payer: Medicare Other

## 2013-03-17 ENCOUNTER — Encounter (HOSPITAL_COMMUNITY): Payer: Self-pay | Admitting: Radiology

## 2013-03-17 LAB — BASIC METABOLIC PANEL
BUN: 42 mg/dL — ABNORMAL HIGH (ref 6–23)
CO2: 23 mEq/L (ref 19–32)
Calcium: 8.1 mg/dL — ABNORMAL LOW (ref 8.4–10.5)
Chloride: 110 mEq/L (ref 96–112)
Creatinine, Ser: 3.79 mg/dL — ABNORMAL HIGH (ref 0.50–1.35)
GFR calc Af Amer: 17 mL/min — ABNORMAL LOW (ref >90)
GFR calc non Af Amer: 15 mL/min — ABNORMAL LOW (ref >90)
Potassium: 5.4 mEq/L — ABNORMAL HIGH (ref 3.5–5.1)

## 2013-03-17 LAB — CBC
HCT: 30.9 % — ABNORMAL LOW (ref 39.0–52.0)
Hemoglobin: 10 g/dL — ABNORMAL LOW (ref 13.0–17.0)
MCV: 84.7 fL (ref 78.0–100.0)
Platelets: 216 10*3/uL (ref 150–400)
RBC: 3.65 MIL/uL — ABNORMAL LOW (ref 4.22–5.81)
RDW: 16.5 % — ABNORMAL HIGH (ref 11.5–15.5)
WBC: 10.3 10*3/uL (ref 4.0–10.5)

## 2013-03-17 LAB — GLUCOSE, CAPILLARY
Glucose-Capillary: 140 mg/dL — ABNORMAL HIGH (ref 70–99)
Glucose-Capillary: 107 mg/dL — ABNORMAL HIGH (ref 70–99)
Glucose-Capillary: 109 mg/dL — ABNORMAL HIGH (ref 70–99)

## 2013-03-17 MED ORDER — PIPERACILLIN-TAZOBACTAM 3.375 G IVPB
3.3750 g | Freq: Three times a day (TID) | INTRAVENOUS | Status: DC
  Administered 2013-03-17 – 2013-03-22 (×16): 3.375 g via INTRAVENOUS
  Filled 2013-03-17 (×18): qty 50

## 2013-03-17 MED ORDER — ACETAMINOPHEN 325 MG PO TABS: 650.0000 mg | ORAL_TABLET | Freq: Four times a day (QID) | ORAL | Status: DC | PRN

## 2013-03-17 MED ORDER — IPRATROPIUM BROMIDE 0.02 % IN SOLN
0.5000 mg | Freq: Four times a day (QID) | RESPIRATORY_TRACT | Status: DC
  Administered 2013-03-17 – 2013-03-18 (×8): 0.5 mg via RESPIRATORY_TRACT
  Filled 2013-03-17 (×8): qty 2.5

## 2013-03-17 MED ORDER — ALBUTEROL SULFATE (5 MG/ML) 0.5% IN NEBU
2.5000 mg | INHALATION_SOLUTION | Freq: Four times a day (QID) | RESPIRATORY_TRACT | Status: DC
  Administered 2013-03-17 – 2013-03-18 (×8): 2.5 mg via RESPIRATORY_TRACT
  Filled 2013-03-17 (×8): qty 0.5

## 2013-03-17 MED ORDER — HEPARIN SODIUM (PORCINE) 5000 UNIT/ML IJ SOLN
5000.0000 [IU] | Freq: Three times a day (TID) | INTRAMUSCULAR | Status: DC
  Administered 2013-03-17 – 2013-03-27 (×32): 5000 [IU] via SUBCUTANEOUS
  Filled 2013-03-17 (×36): qty 1

## 2013-03-17 MED ORDER — VANCOMYCIN HCL 10 G IV SOLR
1500.0000 mg | INTRAVENOUS | Status: DC
  Administered 2013-03-19 – 2013-03-21 (×2): 1500 mg via INTRAVENOUS
  Filled 2013-03-17 (×2): qty 1500

## 2013-03-17 MED ORDER — VANCOMYCIN HCL IN DEXTROSE 1-5 GM/200ML-% IV SOLN
1000.0000 mg | Freq: Once | INTRAVENOUS | Status: AC
  Administered 2013-03-17: 1000 mg via INTRAVENOUS
  Filled 2013-03-17: qty 200

## 2013-03-17 MED ORDER — SODIUM CHLORIDE 0.9 % IJ SOLN
3.0000 mL | Freq: Two times a day (BID) | INTRAMUSCULAR | Status: DC
  Administered 2013-03-17 – 2013-03-27 (×17): 3 mL via INTRAVENOUS

## 2013-03-17 MED ORDER — ACETAMINOPHEN 650 MG RE SUPP: 650.0000 mg | Freq: Four times a day (QID) | RECTAL | Status: DC | PRN

## 2013-03-17 NOTE — Clinical Social Work Psychosocial (Signed)
Clinical Social Work Department BRIEF PSYCHOSOCIAL ASSESSMENT 03/17/2013  Patient:  [name], [name]     Account Number:  [id number]     Admit date:  03/16/2013  Clinical Social Worker:  Varney Biles  Date/Time:  03/17/2013 04:26 PM  Referred by:  Physician  Date Referred:  03/17/2013 Referred for  SNF Placement   Other Referral:   Interview type:  Patient Other interview type:   Also spoke with wife [name], who was at bedside.    PSYCHOSOCIAL DATA Living Status:  FACILITY Admitted from facility:  [facility], [place] Level of care:  Skilled Nursing Facility Primary support name:  [employer] Primary support relationship to patient:  SPOUSE Degree of support available:   Good--pt's wife was at bedside when CSW spoke with him. Pt's wife very concerned about the care pt was receiving at [facility] before hospital admission. Wife wishes to care for pt when he discharges.    CURRENT CONCERNS Current Concerns  Post-Acute Placement   Other Concerns:    SOCIAL WORK ASSESSMENT / PLAN Pt's wife [name] explained that she was not happy with the care pt was receiving at [facility]. CSW provided support and empathy, and offered to send pt's information to other SNFs. Pt's wife declined, saying she would like some time to think about it. Pt's wife believes pt will be in the hospital for around a week, and she is considering taking him home when he is discharged. CSW will check back with pt's wife next week to see what she has decided. CSW informed RNCM about wife's desire to potentially bring pt home.   Assessment/plan status:  Psychosocial Support/Ongoing Assessment of Needs Other assessment/ plan:   Information/referral to community resources:   Potential SNF. Wife deciding if she wants to take pt home.    PATIENT'S/FAMILY'S RESPONSE TO PLAN OF CARE: Pt's wife receptive to CSW visit and understanding of CSW role.       Maryclare Labrador, MSW,  [facility] Clinical Social Worker [phone]

## 2013-03-17 NOTE — ED Notes (Signed)
Pt taken to CT.

## 2013-03-17 NOTE — Progress Notes (Signed)
Subjective:  Patient is more alert and awake today. Complains of cough and shortness of breath Objective:  Vital Signs in the last 24 hours: Temp:  [97.5 F (36.4 C)-98.9 F (37.2 C)] 98.9 F (37.2 C) (10/10 0833) Pulse Rate:  [79-91] 82 (10/10 1236) Resp:  [19-35] 19 (10/10 1236) BP: (72-141)/(41-106) 97/61 mmHg (10/10 1236) SpO2:  [89 %-100 %] 100 % (10/10 1236) Weight:  [104.8 kg (231 lb 0.7 oz)] 104.8 kg (231 lb 0.7 oz) (10/10 0122)  Intake/Output from previous day:   Intake/Output from this shift:    Physical Exam: Neck: no adenopathy, no carotid bruit, no JVD and supple, symmetrical, trachea midline Lungs: Decreased breath sound at bases with right lung rhonchi and faint rales Heart: regular rate and rhythm, S1, S2 normal and Soft systolic murmur and S3 gallop noted Abdomen: soft, non-tender; bowel sounds normal; no masses,  no organomegaly Extremities: No clubbing cyanosis 2+ edema noted  Lab Results:  Recent Labs  03/16/13 2112 03/17/13 0430  WBC 10.8* 10.3  HGB 9.5* 10.0*  PLT 229 216    Recent Labs  03/16/13 2112 03/17/13 0430  NA 145 147*  K 5.7* 5.4*  CL 109 110  CO2 24 23  GLUCOSE 148* 149*  BUN 39* 42*  CREATININE 3.74* 3.79*   No results found for this basename: TROPONINI, CK, MB,  in the last 72 hours Hepatic Function Panel No results found for this basename: PROT, ALBUMIN, AST, ALT, ALKPHOS, BILITOT, BILIDIR, IBILI,  in the last 72 hours No results found for this basename: CHOL,  in the last 72 hours No results found for this basename: PROTIME,  in the last 72 hours  Imaging: Imaging results have been reviewed and Ct Head Wo Contrast  03/17/2013   CLINICAL DATA:  Altered mental status, unresponsive  EXAM: CT HEAD WITHOUT CONTRAST  TECHNIQUE: Contiguous axial images were obtained from the base of the skull through the vertex without intravenous contrast.  COMPARISON:  Prior head CT 11/26/2012  FINDINGS: Negative for acute intracranial  hemorrhage, acute infarction, mass, mass effect, hydrocephalus or midline shift. Gray-white differentiation is preserved throughout. A stable appearance of cerebral atrophy and mild chronic microvascular white matter changes. Bilateral basal ganglia calcifications. No acute soft tissue or calvarial abnormality. Globes and orbits are intact and unremarkable. Normal aeration of the mastoid air cells. Near-total opacification of the right maxillary sinus and scattered opacification of the ethmoid air cells. Mucoperiosteal thickening noted in the right frontal sinus. Findings represent an interval change compared to June of 2014. Atherosclerotic calcifications in the bilateral carotid siphons.  IMPRESSION: 1. No acute intracranial abnormality 2. Interval development of inflammatory paranasal sinus disease, possibly acute sinusitis predominantly affecting the right maxillary sinus and ethmoid air cells. 3. Stable mild atrophy and chronic microvascular ischemic white matter changes.   Electronically Signed   By: Malachy Moan M.D.   On: 03/17/2013 01:07   Dg Chest Portable 1 View  03/16/2013   *RADIOLOGY REPORT*  Clinical Data: Fever and shortness of breath; hypotension.  Altered mental status.  PORTABLE CHEST - 1 VIEW  Comparison: Chest radiograph performed 03/08/2013  Findings: There is new consolidation of the right upper lobe. This is most compatible with pneumonia.  An underlying obstructive lesion cannot be entirely excluded, though the recent prior study demonstrate no evidence of hilar prominence.  A small right pleural effusion is again noted; there is no evidence of pneumothorax.  The cardiomediastinal silhouette is borderline normal in size.  No acute osseous  abnormalities are identified.  IMPRESSION: New consolidation of the right upper lung lobe, most compatible with pneumonia.  Small right pleural effusion again noted.  An underlying obstructing lesion cannot be entirely excluded; would recommend  follow-up CT of the chest after completion of treatment for pneumonia.   Original Report Authenticated By: Tonia Ghent, M.D.    Cardiac Studies:  Assessment/Plan:  Right lung pneumonia Status post septic shock secondary to above History of staph bacteremia in the past Hypertension Decompensated diastolic heart failure Deconditioning Insulin requiring diabetes mellitus Morbid obesity Acute on chronic kidney disease stage IV Anemia Hyperalbuminemia Thrombocytopenia Plan Check cultures Restart Vanco and Zosyn per pharmacy protocol Check labs in a.m. Dr. Algie Coffer on-call for weekend  LOS: 1 day    [facility] N 03/17/2013, 12:57 PM

## 2013-03-17 NOTE — Progress Notes (Signed)
ANTIBIOTIC CONSULT NOTE - INITIAL  Pharmacy Consult for Vancomycin, Zosyn Indication: rule out pneumonia  Allergies  Allergen Reactions  . Ancef [Cefazolin] Itching and Rash    rash  . Sulfa Antibiotics Rash  * Patient has tolerated Zosyn earlier this admission without issues.  Patient Measurements: Height: 5\' 11"  (180.3 cm) Weight: 231 lb 0.7 oz (104.8 kg) IBW/kg (Calculated) : 75.3 Vital Signs: Temp: 98.9 F (37.2 C) (10/10 0833) Temp src: Oral (10/10 0833) BP: 97/61 mmHg (10/10 1236) Pulse Rate: 82 (10/10 1236) Labs:  Recent Labs  03/16/13 2112 03/17/13 0430  WBC 10.8* 10.3  HGB 9.5* 10.0*  PLT 229 216  CREATININE 3.74* 3.79*   Estimated Creatinine Clearance: 23 ml/min (by C-G formula based on Cr of 3.79).  Microbiology: 10/9 Blood x2 >> 10/9 Urine >> 10/10 MRSA PCR negative  Assessment: 67 YO obese male from nursing home s/p recent admit 1 week prior for CHF and sepsis with discharge yesterday only to be readmitted with AMS, unresponsive and hypotension to start vancomycin and Zosyn.   Patient had been discharged home on Levaquin. No source was identified on prior admission.  Patient has now received 1g of vancomycin and 3.375g of Zosyn in the ED.   Patient is in acute renal failure with a SCr of 3.79. Estimated CrCl ~ 20-44ml/min. WBC is 10.3, Tmax 98.9. CXR- new consolidation in RUL.   Antibiotic History:  Vancomycin 9/30 -10/1; 10/10 >> Zosyn 9/30-10/3; 10/10 >> Levaquin 10/3 - 10/10  Culture data:  10/10 MRSA PCR negative 10/9 Urine >> 10/9 Blood x2 >>  Goal of Therapy:  Vancomycin trough level 15-20 mcg/ml  Plan:  1. Vancomycin 1g x1 again (in addition to previous 1g for total loading dose of 2g today), then start regimen of 1500mg  IV q48h.  2. Zosyn 3.375g IV q8h- 4hr infusion.  3. Follow-up renal function and adjust dosing as needed.  4. Follow-up culture data and ability to narrow therapy.   Link Snuffer, PharmD, BCPS Clinical  Pharmacist [phone] 03/17/2013,1:00 PM

## 2013-03-17 NOTE — Progress Notes (Signed)
INITIAL NUTRITION ASSESSMENT  DOCUMENTATION CODES Per approved criteria  -Obesity Unspecified   INTERVENTION: 1. Diet advancement as medically able per SLP and MD. 2. RD will continue to follow  NUTRITION DIAGNOSIS: Inadequate oral intake related to inability to eat as evidenced by NPO status.   Goal: Pt to meet >/= 90% of their estimated nutrition needs   Monitor:  Diet advancement, weight trends, labs  Reason for Assessment: Malnutrition screening tool  67 y.o. male  Admitting Dx: <principal problem not specified>  ASSESSMENT: Pt with recent admission for septic shock and nausea/vomiting discharged on 10/09. Pt with history of DM, HTN, HL, and CHF. Pt readmitted from SNF on 10/9 with hypotension and AMS. Blood pressure at SNF was 79/38.   Pt was unable to give any history and was unresponsive during visit except to open eyes when his name was called. Per RN, pt is to be seen by SLP, but she reported that he swallowed his pills this am without problem. Currently pt is NPO.    Height: Ht Readings from Last 1 Encounters:  03/17/13 5\' 11"  (1.803 m)    Weight: Wt Readings from Last 1 Encounters:  03/17/13 231 lb 0.7 oz (104.8 kg)    Ideal Body Weight: 70.8 kg  % Ideal Body Weight: 147%  Wt Readings from Last 10 Encounters:  03/17/13 231 lb 0.7 oz (104.8 kg)  03/15/13 228 lb 2.8 oz (103.5 kg)  06/06/12 227 lb 4.7 oz (103.1 kg)  05/27/12 238 lb 6.4 oz (108.138 kg)  04/01/12 251 lb 12.3 oz (114.2 kg)  04/01/12 251 lb 12.3 oz (114.2 kg)  04/01/12 251 lb 12.3 oz (114.2 kg)  02/24/12 239 lb 3.2 oz (108.5 kg)  08/07/11 238 lb 8.6 oz (108.2 kg)    Usual Body Weight: unknown  % Usual Body Weight: unknown  BMI:  Body mass index is 32.24 kg/(m^2).  Estimated Nutritional Needs: Kcal: 2600-2900 Protein: 115-125 g Fluid: 2.6-2.9 L  Skin: pressure ulcer stage II on buttocks  Diet Order: NPO  EDUCATION NEEDS: -Education not appropriate at this time  No intake or  output data in the 24 hours ending 03/17/13 1022  Last BM: PTA   Labs:   Recent Labs Lab 03/14/13 0540 03/16/13 2112 03/17/13 0430  NA 147* 145 147*  K 3.8 5.7* 5.4*  CL 112 109 110  CO2 28 24 23   BUN 25* 39* 42*  CREATININE 1.70* 3.74* 3.79*  CALCIUM 8.3* 8.0* 8.1*  GLUCOSE 91 148* 149*    CBG (last 3)   Recent Labs  03/15/13 0546 03/15/13 1120 03/17/13 0828  GLUCAP 116* 148* 140*    Scheduled Meds: . albuterol  2.5 mg Nebulization Q6H  . aspirin  325 mg Oral Daily  . heparin  5,000 Units Subcutaneous Q8H  . ipratropium  0.5 mg Nebulization Q6H  . pantoprazole  40 mg Oral Daily  . sodium chloride  3 mL Intravenous Q12H    Continuous Infusions:   Past Medical History  Diagnosis Date  . Diabetes mellitus   . Hypertension   . Hyperlipidemia   . CHF (congestive heart failure)   . Enlarged heart   . Pneumonia     hx of     Past Surgical History  Procedure Laterality Date  . Right leg fracture    . Skin graft    . Knee surgery    . Tee without cardioversion  03/17/2012    Procedure: TRANSESOPHAGEAL ECHOCARDIOGRAM (TEE);  Surgeon: Ricki Rodriguez, MD;  Location: [facility] ENDOSCOPY;  Service: Cardiovascular;  Laterality: N/A;    Ebbie Latus RD, LDN

## 2013-03-17 NOTE — Progress Notes (Signed)
Utilization review completed. Jode Lippe, RN, BSN. 

## 2013-03-18 LAB — CBC
HCT: 27.9 % — ABNORMAL LOW (ref 39.0–52.0)
Hemoglobin: 9.1 g/dL — ABNORMAL LOW (ref 13.0–17.0)
MCH: 27.2 pg (ref 26.0–34.0)
MCV: 83.5 fL (ref 78.0–100.0)
Platelets: 205 10*3/uL (ref 150–400)
RBC: 3.34 MIL/uL — ABNORMAL LOW (ref 4.22–5.81)
RDW: 16.2 % — ABNORMAL HIGH (ref 11.5–15.5)
WBC: 9.9 10*3/uL (ref 4.0–10.5)

## 2013-03-18 LAB — BASIC METABOLIC PANEL
CO2: 26 mEq/L (ref 19–32)
Chloride: 111 mEq/L (ref 96–112)
GFR calc Af Amer: 20 mL/min — ABNORMAL LOW (ref >90)
Potassium: 4.7 mEq/L (ref 3.5–5.1)
Sodium: 148 mEq/L — ABNORMAL HIGH (ref 135–145)

## 2013-03-18 LAB — GLUCOSE, CAPILLARY
Glucose-Capillary: 89 mg/dL (ref 70–99)
Glucose-Capillary: 110 mg/dL — ABNORMAL HIGH (ref 70–99)

## 2013-03-18 LAB — URINE CULTURE: Colony Count: NO GROWTH

## 2013-03-18 MED ORDER — IPRATROPIUM BROMIDE 0.02 % IN SOLN
0.5000 mg | Freq: Three times a day (TID) | RESPIRATORY_TRACT | Status: DC
  Administered 2013-03-19 – 2013-03-25 (×19): 0.5 mg via RESPIRATORY_TRACT
  Filled 2013-03-18 (×19): qty 2.5

## 2013-03-18 MED ORDER — ALBUTEROL SULFATE (5 MG/ML) 0.5% IN NEBU: 2.5000 mg | INHALATION_SOLUTION | RESPIRATORY_TRACT | Status: DC | PRN

## 2013-03-18 MED ORDER — DIPHENHYDRAMINE HCL 25 MG PO CAPS
25.0000 mg | ORAL_CAPSULE | Freq: Four times a day (QID) | ORAL | Status: DC | PRN
  Administered 2013-03-18 – 2013-03-22 (×6): 25 mg via ORAL
  Filled 2013-03-18 (×5): qty 1

## 2013-03-18 MED ORDER — ALBUTEROL SULFATE (5 MG/ML) 0.5% IN NEBU
2.5000 mg | INHALATION_SOLUTION | Freq: Three times a day (TID) | RESPIRATORY_TRACT | Status: DC
  Administered 2013-03-19 – 2013-03-25 (×19): 2.5 mg via RESPIRATORY_TRACT
  Filled 2013-03-18 (×19): qty 0.5

## 2013-03-18 NOTE — Progress Notes (Signed)
Subjective:  Awakens easily. T max 99.6.   Objective:  Vital Signs in the last 24 hours: Temp:  [98.2 F (36.8 C)-99.6 F (37.6 C)] 98.2 F (36.8 C) (10/11 0818) Pulse Rate:  [79-89] 79 (10/11 0818) Cardiac Rhythm:  [-] Normal sinus rhythm (10/11 0429) Resp:  [19-26] 19 (10/11 0818) BP: (97-149)/(59-78) 126/59 mmHg (10/11 0818) SpO2:  [92 %-100 %] 92 % (10/11 0837) Weight:  [103.7 kg (228 lb 9.9 oz)] 103.7 kg (228 lb 9.9 oz) (10/11 0500)  Physical Exam: BP Readings from Last 1 Encounters:  03/18/13 126/59     Wt Readings from Last 1 Encounters:  03/18/13 103.7 kg (228 lb 9.9 oz)    Weight change: -1.1 kg (-2 lb 6.8 oz)  HEENT: [place]/AT, Eyes-Brown, PERL, EOMI, Conjunctiva-Pale, Sclera-Non-icteric Neck: No JVD, No bruit, Trachea midline. Lungs:  Clearing, Bilateral. Cardiac:  Regular rhythm, normal S1 and S2, no S3.  Abdomen:  Soft, non-tender. Extremities:  1-2 + edema of lower extremity present. No cyanosis. No clubbing. CNS: AxOx3, Cranial nerves grossly intact, moves all 4 extremities. Right handed. Skin: Warm and dry.   Intake/Output from previous day: 10/10 0701 - 10/11 0700 In: 100 [IV Piggyback:100] Out: 600 [Urine:600]    Lab Results: BMET    Component Value Date/Time   NA 148* 03/18/2013 0530   K 4.7 03/18/2013 0530   CL 111 03/18/2013 0530   CO2 26 03/18/2013 0530   GLUCOSE 89 03/18/2013 0530   BUN 48* 03/18/2013 0530   CREATININE 3.33* 03/18/2013 0530   CALCIUM 8.3* 03/18/2013 0530   GFRNONAA 18* 03/18/2013 0530   GFRAA 20* 03/18/2013 0530   CBC    Component Value Date/Time   WBC 9.9 03/18/2013 0530   RBC 3.34* 03/18/2013 0530   HGB 9.1* 03/18/2013 0530   HCT 27.9* 03/18/2013 0530   PLT 205 03/18/2013 0530   MCV 83.5 03/18/2013 0530   MCH 27.2 03/18/2013 0530   MCHC 32.6 03/18/2013 0530   RDW 16.2* 03/18/2013 0530   LYMPHSABS 0.8 03/16/2013 2112   MONOABS 0.2 03/16/2013 2112   EOSABS 0.4 03/16/2013 2112   BASOSABS 0.0 03/16/2013 2112    CARDIAC ENZYMES Lab Results  Component Value Date   CKTOTAL 108 08/04/2011   CKMB 5.1* 08/04/2011   TROPONINI <0.30 03/09/2013    Scheduled Meds: . albuterol  2.5 mg Nebulization Q6H  . aspirin  325 mg Oral Daily  . heparin  5,000 Units Subcutaneous Q8H  . ipratropium  0.5 mg Nebulization Q6H  . pantoprazole  40 mg Oral Daily  . piperacillin-tazobactam (ZOSYN)  IV  3.375 g Intravenous Q8H  . sodium chloride  3 mL Intravenous Q12H  . [START ON 03/19/2013] vancomycin  1,500 mg Intravenous Q48H   Continuous Infusions:  PRN Meds:.acetaminophen, acetaminophen  Assessment/Plan:  Right lung pneumonia  Status post septic shock secondary to above  History of staph bacteremia in the past  Hypertension  Decompensated diastolic heart failure  Deconditioning  Insulin requiring diabetes mellitus  Morbid obesity  Acute on chronic kidney disease stage IV  Anemia  Hyperalbuminemia  Thrombocytopenia-resolved  Continue medical treatment    LOS: 2 days    Orpah Cobb  MD  03/18/2013, 11:16 AM

## 2013-03-19 LAB — GLUCOSE, CAPILLARY
Glucose-Capillary: 154 mg/dL — ABNORMAL HIGH (ref 70–99)
Glucose-Capillary: 160 mg/dL — ABNORMAL HIGH (ref 70–99)

## 2013-03-19 MED ORDER — ISOSORB DINITRATE-HYDRALAZINE 20-37.5 MG PO TABS
1.0000 | ORAL_TABLET | Freq: Two times a day (BID) | ORAL | Status: DC
  Administered 2013-03-19 – 2013-03-20 (×3): 1 via ORAL
  Filled 2013-03-19 (×5): qty 1

## 2013-03-19 NOTE — Progress Notes (Signed)
Pt's foley was d/c'd at 1630. A condom cath was placed after several episodes of incontinence. Drained small amts of yellow urine.125cc's at midnight. Pt has been c/o intermittent pressure and discomfort. Bladder scan done=720cc's. Dr. Algie Coffer made aware.Pt in & out cathed as instructed =625ccs. Pt tolerated procedure well.

## 2013-03-19 NOTE — Progress Notes (Signed)
Subjective:  Sitting up. No new complaint. Afebrile.  Objective:  Vital Signs in the last 24 hours: Temp:  [97.4 F (36.3 C)-98.5 F (36.9 C)] 97.7 F (36.5 C) (10/12 0438) Pulse Rate:  [79-110] 79 (10/12 0630) Cardiac Rhythm:  [-] Normal sinus rhythm (10/12 0630) Resp:  [11-27] 15 (10/12 0438) BP: (108-152)/(53-81) 129/81 mmHg (10/12 0630) SpO2:  [92 %-100 %] 95 % (10/12 0752)  Physical Exam: BP Readings from Last 1 Encounters:  03/19/13 129/81     Wt Readings from Last 1 Encounters:  03/18/13 103.7 kg (228 lb 9.9 oz)    Weight change:   HEENT: [place]/AT, Eyes-Brown, PERL, EOMI, Conjunctiva-Pale, Sclera-Non-icteric Neck: No JVD, No bruit, Trachea midline. Lungs:  Clearing, Bilateral. Cardiac:  Regular rhythm, normal S1 and S2, no S3.  Abdomen:  Soft, non-tender. Extremities:  1 + edema present. No cyanosis. No clubbing. CNS: AxOx3, Cranial nerves grossly intact, moves all 4 extremities. Right handed. Skin: Warm and dry.   Intake/Output from previous day: 10/11 0701 - 10/12 0700 In: 1056 [P.O.:950; I.V.:6; IV Piggyback:100] Out: 1775 [Urine:1775]    Lab Results: BMET    Component Value Date/Time   NA 148* 03/18/2013 0530   K 4.7 03/18/2013 0530   CL 111 03/18/2013 0530   CO2 26 03/18/2013 0530   GLUCOSE 89 03/18/2013 0530   BUN 48* 03/18/2013 0530   CREATININE 3.33* 03/18/2013 0530   CALCIUM 8.3* 03/18/2013 0530   GFRNONAA 18* 03/18/2013 0530   GFRAA 20* 03/18/2013 0530   CBC    Component Value Date/Time   WBC 9.9 03/18/2013 0530   RBC 3.34* 03/18/2013 0530   HGB 9.1* 03/18/2013 0530   HCT 27.9* 03/18/2013 0530   PLT 205 03/18/2013 0530   MCV 83.5 03/18/2013 0530   MCH 27.2 03/18/2013 0530   MCHC 32.6 03/18/2013 0530   RDW 16.2* 03/18/2013 0530   LYMPHSABS 0.8 03/16/2013 2112   MONOABS 0.2 03/16/2013 2112   EOSABS 0.4 03/16/2013 2112   BASOSABS 0.0 03/16/2013 2112   CARDIAC ENZYMES Lab Results  Component Value Date   CKTOTAL 108 08/04/2011   CKMB 5.1*  08/04/2011   TROPONINI <0.30 03/09/2013    Scheduled Meds: . albuterol  2.5 mg Nebulization TID  . aspirin  325 mg Oral Daily  . heparin  5,000 Units Subcutaneous Q8H  . ipratropium  0.5 mg Nebulization TID  . pantoprazole  40 mg Oral Daily  . piperacillin-tazobactam (ZOSYN)  IV  3.375 g Intravenous Q8H  . sodium chloride  3 mL Intravenous Q12H  . vancomycin  1,500 mg Intravenous Q48H   Continuous Infusions:  PRN Meds:.acetaminophen, acetaminophen, albuterol, diphenhydrAMINE  Assessment/Plan: Right lung pneumonia  Status post septic shock secondary to above  History of staph bacteremia in the past  Hypertension  Decompensated diastolic heart failure  Deconditioning  Insulin requiring diabetes mellitus  Morbid obesity  Acute on chronic kidney disease stage IV  Anemia  Hyperalbuminemia  Thrombocytopenia-resolved  Continue medical treatment. Increase activity.   LOS: 3 days    Orpah Cobb  MD  03/19/2013, 8:34 AM

## 2013-03-20 LAB — GLUCOSE, CAPILLARY
Glucose-Capillary: 148 mg/dL — ABNORMAL HIGH (ref 70–99)
Glucose-Capillary: 131 mg/dL — ABNORMAL HIGH (ref 70–99)

## 2013-03-20 MED ORDER — ALBUMIN HUMAN 25 % IV SOLN
25.0000 g | Freq: Once | INTRAVENOUS | Status: AC
  Administered 2013-03-20: 25 g via INTRAVENOUS
  Filled 2013-03-20: qty 100

## 2013-03-20 MED ORDER — FUROSEMIDE 10 MG/ML IJ SOLN
40.0000 mg | Freq: Every day | INTRAMUSCULAR | Status: DC
  Administered 2013-03-20: 40 mg via INTRAVENOUS
  Filled 2013-03-20 (×2): qty 4

## 2013-03-20 MED ORDER — CARVEDILOL 3.125 MG PO TABS
3.1250 mg | ORAL_TABLET | Freq: Two times a day (BID) | ORAL | Status: DC
  Administered 2013-03-21: 3.125 mg via ORAL
  Filled 2013-03-20 (×3): qty 1

## 2013-03-20 NOTE — Clinical Social Work Note (Signed)
CSW left pt's wife Malachi Bonds a Engineer, technical sales requesting that Malachi Bonds give CSW a call back. In her message, CSW explained that the last time they spoke Malachi Bonds was unhappy with the care her husband received at [facility] [place]. CSW asked Malachi Bonds to call CSW if she has considered any facilities that Malachi Bonds might like to utilize for her husband.    Maryclare Labrador, MSW, [facility] Clinical Social Worker [phone]

## 2013-03-20 NOTE — Progress Notes (Signed)
Subjective:  Patient is more alert and awake. Denies any chest pain states breathing is improved  Objective:  Vital Signs in the last 24 hours: Temp:  [97.4 F (36.3 C)-99 F (37.2 C)] 98.6 F (37 C) (10/13 1650) Pulse Rate:  [86-110] 100 (10/13 1650) Resp:  [8-27] 11 (10/13 1650) BP: (124-188)/(64-96) 183/86 mmHg (10/13 1650) SpO2:  [92 %-100 %] 96 % (10/13 1650) Weight:  [105 kg (231 lb 7.7 oz)] 105 kg (231 lb 7.7 oz) (10/13 0400)  Intake/Output from previous day: 10/12 0701 - 10/13 0700 In: 653 [I.V.:3; IV Piggyback:650] Out: 1225 [Urine:1225] Intake/Output from this shift: Total I/O In: 530 [P.O.:480; IV Piggyback:50] Out: 801 [Urine:800; Stool:1]  Physical Exam: Neck: no adenopathy, no carotid bruit, no JVD and supple, symmetrical, trachea midline Lungs: Decreased breath sound at bases with right lung rhonchi Heart: regular rate and rhythm, S1, S2 normal and Soft systolic murmur and S3 gallop noted Abdomen: soft, non-tender; bowel sounds normal; no masses,  no organomegaly Extremities: No clubbing cyanosis 3+ edema noted  Lab Results:  Recent Labs  03/18/13 0530  WBC 9.9  HGB 9.1*  PLT 205    Recent Labs  03/18/13 0530  NA 148*  K 4.7  CL 111  CO2 26  GLUCOSE 89  BUN 48*  CREATININE 3.33*   No results found for this basename: TROPONINI, CK, MB,  in the last 72 hours Hepatic Function Panel No results found for this basename: PROT, ALBUMIN, AST, ALT, ALKPHOS, BILITOT, BILIDIR, IBILI,  in the last 72 hours No results found for this basename: CHOL,  in the last 72 hours No results found for this basename: PROTIME,  in the last 72 hours  Imaging: Imaging results have been reviewed and No results found.  Cardiac Studies:  Assessment/Plan:   Right lung pneumonia  Status post septic shock secondary to above  History of staph bacteremia in the past  Hypertension  Decompensated diastolic heart failure  Deconditioning  Insulin requiring diabetes  mellitus  Morbid obesity  Acute on chronic kidney disease stage IV  Anemia  Hypoalbuminemia  Thrombocytopenia-resolved  plan Add Coreg as per orders Check labs and x-ray in a.m.   Plan   LOS: 4 days    [name] N 03/20/2013, 5:12 PM

## 2013-03-20 NOTE — Progress Notes (Signed)
ANTIBIOTIC CONSULT NOTE - FOLLOW UP  Pharmacy Consult for  Indication: pneumonia  Allergies  Allergen Reactions  . Ancef [Cefazolin] Itching and Rash    rash  . Sulfa Antibiotics Rash    Patient Measurements: Height: 5\' 11"  (180.3 cm) Weight: 231 lb 7.7 oz (105 kg) IBW/kg (Calculated) : 75.3 Adjusted Body Weight:   Vital Signs: Temp: 99 F (37.2 C) (10/13 0803) Temp src: Oral (10/13 0803) BP: 135/72 mmHg (10/13 0803) Pulse Rate: 89 (10/13 0803) Intake/Output from previous day: 10/12 0701 - 10/13 0700 In: 603 [I.V.:3; IV Piggyback:600] Out: 1225 [Urine:1225] Intake/Output from this shift:    Labs:  Recent Labs  03/18/13 0530  WBC 9.9  HGB 9.1*  PLT 205  CREATININE 3.33*   Estimated Creatinine Clearance: 26.2 ml/min (by C-G formula based on Cr of 3.33). No results found for this basename: VANCOTROUGH, Leodis Binet, VANCORANDOM, GENTTROUGH, GENTPEAK, GENTRANDOM, TOBRATROUGH, TOBRAPEAK, TOBRARND, AMIKACINPEAK, AMIKACINTROU, AMIKACIN,  in the last 72 hours   Microbiology: Recent Results (from the past 720 hour(s))  MRSA PCR SCREENING     Status: None   Collection Time    03/07/13  2:02 PM      Result Value Range Status   MRSA by PCR NEGATIVE  NEGATIVE Final   Comment:            The GeneXpert MRSA Assay (FDA     approved for NASAL specimens     only), is one component of a     comprehensive MRSA colonization     surveillance program. It is not     intended to diagnose MRSA     infection nor to guide or     monitor treatment for     MRSA infections.  CULTURE, BLOOD (ROUTINE X 2)     Status: None   Collection Time    03/07/13  3:00 PM      Result Value Range Status   Specimen Description BLOOD LEFT ARM   Final   Special Requests BOTTLES DRAWN AEROBIC AND ANAEROBIC 10CC   Final   Culture  Setup Time     Final   Value: 03/07/2013 20:50     Performed at [facility]   Culture     Final   Value: NO GROWTH 5 DAYS     Performed at [facility]    Report Status 03/13/2013 FINAL   Final  CULTURE, BLOOD (ROUTINE X 2)     Status: None   Collection Time    03/07/13  3:15 PM      Result Value Range Status   Specimen Description BLOOD RIGHT HAND   Final   Special Requests BOTTLES DRAWN AEROBIC ONLY [facility]   Final   Culture  Setup Time     Final   Value: 03/07/2013 20:50     Performed at [facility]   Culture     Final   Value: NO GROWTH 5 DAYS     Performed at [facility]   Report Status 03/13/2013 FINAL   Final  URINE CULTURE     Status: None   Collection Time    03/08/13  4:15 PM      Result Value Range Status   Specimen Description URINE, CATHETERIZED   Final   Special Requests NONE   Final   Culture  Setup Time     Final   Value: 03/08/2013 16:44     Performed at [facility] Count  Final   Value: NO GROWTH     Performed at [facility]   Culture     Final   Value: NO GROWTH     Performed at [facility]   Report Status 03/09/2013 FINAL   Final  CULTURE, BLOOD (ROUTINE X 2)     Status: None   Collection Time    03/16/13  9:00 PM      Result Value Range Status   Specimen Description BLOOD HAND RIGHT   Final   Special Requests BOTTLES DRAWN AEROBIC ONLY 1CC   Final   Culture  Setup Time     Final   Value: 03/17/2013 04:54     Performed at [facility]   Culture     Final   Value:        BLOOD CULTURE RECEIVED NO GROWTH TO DATE CULTURE WILL BE HELD FOR 5 DAYS BEFORE ISSUING A FINAL NEGATIVE REPORT     Performed at [facility]   Report Status PENDING   Incomplete  CULTURE, BLOOD (ROUTINE X 2)     Status: None   Collection Time    03/16/13  9:12 PM      Result Value Range Status   Specimen Description BLOOD ARM LEFT   Final   Special Requests BOTTLES DRAWN AEROBIC ONLY 10CC   Final   Culture  Setup Time     Final   Value: 03/17/2013 04:54     Performed at [facility]   Culture     Final   Value:        BLOOD CULTURE RECEIVED NO GROWTH  TO DATE CULTURE WILL BE HELD FOR 5 DAYS BEFORE ISSUING A FINAL NEGATIVE REPORT     Performed at [facility]   Report Status PENDING   Incomplete  URINE CULTURE     Status: None   Collection Time    03/16/13 10:08 PM      Result Value Range Status   Specimen Description URINE, CATHETERIZED   Final   Special Requests CX ADDED AT 2237 ON [id number]   Final   Culture  Setup Time     Final   Value: 03/17/2013 05:03     Performed at [facility]   Colony Count     Final   Value: NO GROWTH     Performed at [facility]   Culture     Final   Value: NO GROWTH     Performed at [facility]   Report Status 03/18/2013 FINAL   Final  MRSA PCR SCREENING     Status: None   Collection Time    03/17/13  1:22 AM      Result Value Range Status   MRSA by PCR NEGATIVE  NEGATIVE Final   Comment:            The GeneXpert MRSA Assay (FDA     approved for NASAL specimens     only), is one component of a     comprehensive MRSA colonization     surveillance program. It is not     intended to diagnose MRSA     infection nor to guide or     monitor treatment for     MRSA infections.    Anti-infectives   Start     Dose/Rate Route Frequency Ordered Stop   03/19/13 1200  vancomycin (VANCOCIN) 1,500 mg in sodium chloride 0.9 % 500 mL IVPB  1,500 mg 250 mL/hr over 120 Minutes Intravenous Every 48 hours 03/17/13 1316     03/17/13 1330  vancomycin (VANCOCIN) IVPB 1000 mg/200 mL premix     1,000 mg 200 mL/hr over 60 Minutes Intravenous  Once 03/17/13 1316 03/17/13 1741   03/17/13 1330  piperacillin-tazobactam (ZOSYN) IVPB 3.375 g     3.375 g 12.5 mL/hr over 240 Minutes Intravenous 3 times per day 03/17/13 1316     03/16/13 2145  levofloxacin (LEVAQUIN) IVPB 750 mg  Status:  Discontinued     750 mg 100 mL/hr over 90 Minutes Intravenous  Once 03/16/13 2135 03/16/13 2136   03/16/13 2145  vancomycin (VANCOCIN) IVPB 1000 mg/200 mL premix     1,000 mg 200 mL/hr over 60  Minutes Intravenous  Once 03/16/13 2138 03/16/13 2343   03/16/13 2145  piperacillin-tazobactam (ZOSYN) IVPB 3.375 g     3.375 g 100 mL/hr over 30 Minutes Intravenous  Once 03/16/13 2138 03/16/13 2225      Assessment: Pt is starting to feel better. He is no longer on Levaquin. He is on Zosyn and Vancomycin. CrCl is being followed and dosages will be changed in response to renal fuction if needed.  Goal of Therapy:  Vancomycin trough level 15-20 mcg/ml  Plan:  Will continue with Zosyn and Vancomycin.  Vancomycin trough when appropriate.  Eugene Garnet 03/20/2013,11:34 AM

## 2013-03-20 NOTE — Progress Notes (Signed)
SLP Cancellation Note  Patient Details Name: Justin Dirocco Sr. MRN: [id number] DOB: December 21, 1945   Cancelled treatment:       Reason Eval/Treat Not Completed: Patient's level of consciousness. Responsive however too lethargic for exam. Will f/u.   Ferdinand Lango MA, CCC-SLP [phone]    Ferdinand Lango Meryl 03/20/2013, 12:49 PM

## 2013-03-21 LAB — COMPREHENSIVE METABOLIC PANEL
AST: 15 U/L (ref 0–37)
Albumin: 3.1 g/dL — ABNORMAL LOW (ref 3.5–5.2)
Alkaline Phosphatase: 67 U/L (ref 39–117)
BUN: 30 mg/dL — ABNORMAL HIGH (ref 6–23)
CO2: 24 mEq/L (ref 19–32)
Creatinine, Ser: 2.14 mg/dL — ABNORMAL HIGH (ref 0.50–1.35)
Potassium: 3.9 mEq/L (ref 3.5–5.1)
Sodium: 146 mEq/L — ABNORMAL HIGH (ref 135–145)
Total Protein: 7.1 g/dL (ref 6.0–8.3)

## 2013-03-21 LAB — CBC
HCT: 31 % — ABNORMAL LOW (ref 39.0–52.0)
MCHC: 32.3 g/dL (ref 30.0–36.0)
MCV: 83.6 fL (ref 78.0–100.0)
RBC: 3.71 MIL/uL — ABNORMAL LOW (ref 4.22–5.81)
WBC: 8 10*3/uL (ref 4.0–10.5)

## 2013-03-21 LAB — PRO B NATRIURETIC PEPTIDE: Pro B Natriuretic peptide (BNP): 14373 pg/mL — ABNORMAL HIGH (ref 0–125)

## 2013-03-21 LAB — GLUCOSE, CAPILLARY
Glucose-Capillary: 114 mg/dL — ABNORMAL HIGH (ref 70–99)
Glucose-Capillary: 150 mg/dL — ABNORMAL HIGH (ref 70–99)
Glucose-Capillary: 139 mg/dL — ABNORMAL HIGH (ref 70–99)

## 2013-03-21 MED ORDER — FUROSEMIDE 10 MG/ML IJ SOLN
40.0000 mg | Freq: Two times a day (BID) | INTRAMUSCULAR | Status: DC
  Administered 2013-03-21 – 2013-03-25 (×8): 40 mg via INTRAVENOUS
  Filled 2013-03-21 (×10): qty 4

## 2013-03-21 MED ORDER — ALBUMIN HUMAN 25 % IV SOLN
25.0000 g | Freq: Once | INTRAVENOUS | Status: AC
  Administered 2013-03-21: 25 g via INTRAVENOUS
  Filled 2013-03-21: qty 100

## 2013-03-21 MED ORDER — CARVEDILOL 6.25 MG PO TABS
6.2500 mg | ORAL_TABLET | Freq: Two times a day (BID) | ORAL | Status: DC
  Administered 2013-03-21 – 2013-03-22 (×2): 6.25 mg via ORAL
  Filled 2013-03-21 (×4): qty 1

## 2013-03-21 NOTE — Clinical Social Work Note (Signed)
CSW called pt's wife Malachi Bonds to ask if there are any SNFs that Malachi Bonds would like CSW to send clinicals to, as pt's wife does not want him to go back to [facility]. Malachi Bonds explained that she has not thought about facilities yet, and that she will call CSW back tomorrow to inform CSW if there are any facilities she would like to contact. CSW verified that Malachi Bonds has her phone number and will update when [place] calls.   Maryclare Labrador, MSW, [facility] Clinical Social Worker [phone]

## 2013-03-21 NOTE — Evaluation (Signed)
Physical Therapy Evaluation Patient Details Name: Justin Cupples Sr. MRN: [id number] DOB: 07/01/1945 Today's Date: 03/21/2013 Time: 0865-7846 PT Time Calculation (min): 39 min  PT Assessment / Plan / Recommendation History of Present Illness  Pt readmitted from brief stay at SNF with PNA after discharge last week with CHF and weakness. 66M with hx of diastolic CHF, CRI, DM II, Htn, hyperlipidemia decompensated CHF, poorly controlled htn.   Clinical Impression  Pt familiar from prior admission with continued plan to return to SNF to become stronger to return home with wife and decrease her burden of care. Pt soiled on arrival with increased time to perform pericare, transfers and linen change prior to gait with RN aware. Pt pleasant and ambulating further then his last distance at discharge. Pt with below deficits and will benefit from acute therapy to maximize mobility, gait and function prior to return to SNF.    PT Assessment  Patient needs continued PT services    Follow Up Recommendations  SNF;Supervision/Assistance - 24 hour    Does the patient have the potential to tolerate intense rehabilitation      Barriers to Discharge Decreased caregiver support      Equipment Recommendations  None recommended by PT    Recommendations for Other Services     Frequency Min 2X/week    Precautions / Restrictions Precautions Precautions: Fall Precaution Comments: painful right shoulder with limited ROM   Pertinent Vitals/Pain No pain VSS sats 95% on RA      Mobility  Bed Mobility Bed Mobility: Rolling Right;Rolling Left;Right Sidelying to Sit;Sitting - Scoot to Edge of Bed Rolling Right: 4: Min assist Rolling Left: 3: Mod assist Right Sidelying to Sit: 3: Mod assist;HOB flat;With rails Details for Bed Mobility Assistance: cueing for sequence with assist to fully elevate trunk, cueing for use of rail with increased time to complete transfers Transfers Sit to Stand: 3: Mod  assist;From bed Stand to Sit: 4: Min guard;To chair/3-in-1;With armrests Details for Transfer Assistance: cueing for hand placement, and sequence x 2 trials from bed and from chair after seated rest between ambulation trials Ambulation/Gait Ambulation/Gait Assistance: 4: Min guard Ambulation Distance (Feet): 80 Feet (80'x 2 trials) Assistive device: Rolling walker Ambulation/Gait Assistance Details: 80'x2 with seated rest, cueing for posture, position in RW and to look ahead, chair following pt for fatigue Gait Pattern: Step-through pattern;Decreased stride length;Trunk flexed Gait velocity: decreased Stairs: No    Exercises     PT Diagnosis: Difficulty walking;Generalized weakness  PT Problem List: Decreased strength;Decreased activity tolerance;Decreased mobility;Decreased knowledge of use of DME PT Treatment Interventions: Gait training;Functional mobility training;Stair training;Therapeutic activities;Therapeutic exercise;Patient/family education;DME instruction     PT Goals(Current goals can be found in the care plan section) Acute Rehab PT Goals Patient Stated Goal: return to gardening PT Goal Formulation: With patient Time For Goal Achievement: 04/04/13 Potential to Achieve Goals: Fair  Visit Information  Last PT Received On: 03/21/13 Assistance Needed: +2 History of Present Illness: Pt readmitted from brief stay at SNF with PNA after discharge last week with CHF and weakness. 66M with hx of diastolic CHF, CRI, DM II, Htn, hyperlipidemia decompensated CHF, poorly controlled htn.        Prior Functioning  Home Living Family/patient expects to be discharged to:: Skilled nursing facility    Cognition  Cognition Arousal/Alertness: Awake/alert Behavior During Therapy: [facility] for tasks assessed/performed Overall Cognitive Status: Impaired/Different from baseline Area of Impairment: Orientation Orientation Level: Time Problem Solving: Slow processing    Extremity/Trunk  Assessment Upper  Extremity Assessment Upper Extremity Assessment: Generalized weakness RUE Deficits / Details: Pt reports pain in R shoulder with movement.  Lower Extremity Assessment Lower Extremity Assessment: Generalized weakness Cervical / Trunk Assessment Cervical / Trunk Assessment: Normal   Balance    End of Session PT - End of Session Equipment Utilized During Treatment: Gait belt Activity Tolerance: Patient tolerated treatment well Patient left: in chair;with call bell/phone within reach Nurse Communication: Mobility status  GP     Toney Sang Beth 03/21/2013, 12:59 PM Delaney Meigs, PT [phone]

## 2013-03-21 NOTE — Evaluation (Signed)
Clinical/Bedside Swallow Evaluation Patient Details  Name: Justin Lieurance Sr. MRN: [id number] Date of Birth: 04-14-1946  Today's Date: 03/21/2013 Time: 4098-1191 SLP Time Calculation (min): 10 min  Past Medical History:  Past Medical History  Diagnosis Date  . Diabetes mellitus   . Hypertension   . Hyperlipidemia   . CHF (congestive heart failure)   . Enlarged heart   . Pneumonia     hx of    Past Surgical History:  Past Surgical History  Procedure Laterality Date  . Right leg fracture    . Skin graft    . Knee surgery    . Tee without cardioversion  03/17/2012    Procedure: TRANSESOPHAGEAL ECHOCARDIOGRAM (TEE);  Surgeon: Ricki Rodriguez, MD;  Location: [facility];  Service: Cardiovascular;  Laterality: N/A;   HPI:  Justin Chenault Sr. is a 67 y.o. Male with hx of DM, HTN, HL, CHF admitted with hypotension and AMS. Patient was admitted a week ago for hypotension and CHF. He was found to be in severe sepsis and no source was found. He discharge to the nursing home. As per family he was very altered unresponsive prior to admission. Blood pressure at the nursing home was 79/38. Blood sugar is 140 mg. range Patient unresponsive and unable to give any history. CXR 10/9 showed new right upper lung pneumonia, now resolving.  Unable to participate in swallow assessment yesterday due to lethargy.  Much more alert and participatory today.   Assessment / Plan / Recommendation Clinical Impression  Pt presents with normal oropharyngeal swallow function with adequate mastication, swift swallow trigger, and no overt s/s of compromised airway protection.  Recommend continuing current diet - regular, thin liquids - no SLP f/u required.           Diet Recommendation Regular;Thin liquid   Liquid Administration via: Cup;Straw Medication Administration: Whole meds with liquid Supervision: Patient able to self feed    Other  Recommendations Oral Care Recommendations: Oral care BID   Follow Up  Recommendations  None    Swallow Study Prior Functional Status       General Date of Onset: 03/16/13 . Type of Study: Bedside swallow evaluation Previous Swallow Assessment: none per records Diet Prior to this Study: Regular;Thin liquids Temperature Spikes Noted: No Respiratory Status: Room air History of Recent Intubation: No Behavior/Cognition: Alert;Cooperative Oral Cavity - Dentition: Missing dentition Self-Feeding Abilities: Able to feed self Patient Positioning: Upright in chair Baseline Vocal Quality: Clear Volitional Cough: Strong Volitional Swallow: Able to elicit    Oral/Motor/Sensory Function Overall Oral Motor/Sensory Function: Appears within functional limits for tasks assessed   Ice Chips Ice chips: Within functional limits Presentation: Cup   Thin Liquid Thin Liquid: Within functional limits Presentation: Self Fed;Cup;Straw    Nectar Thick Nectar Thick Liquid: Not tested   Honey Thick Honey Thick Liquid: Not tested   Puree Puree: Within functional limits   Solid   GO    Solid: Within functional limits Presentation: Self Fed      [name] L. [name], [place] CCC/SLP Pager [phone]  [name] [name] 03/21/2013,2:06 PM

## 2013-03-21 NOTE — Progress Notes (Signed)
Subjective:  Patient denies any chest pain states breathing has improved. Complains of generalized weakness in the legs  Objective:  Vital Signs in the last 24 hours: Temp:  [97.9 F (36.6 C)-99.6 F (37.6 C)] 98.9 F (37.2 C) (10/14 0817) Pulse Rate:  [88-111] 100 (10/14 0817) Resp:  [8-24] 14 (10/14 0817) BP: (124-188)/(64-91) 155/75 mmHg (10/14 0817) SpO2:  [92 %-99 %] 95 % (10/14 0817)  Intake/Output from previous day: 10/13 0701 - 10/14 0700 In: 773 [P.O.:720; I.V.:3; IV Piggyback:50] Out: 1751 [Urine:1750; Stool:1] Intake/Output from this shift:    Physical Exam: Neck: no adenopathy, no carotid bruit, no JVD and supple, symmetrical, trachea midline Lungs: Decreased breath sound at bases with rhonchi right lung field Heart: regular rate and rhythm, S1, S2 normal and Soft systolic murmur and S3 gallop noted Abdomen: soft, non-tender; bowel sounds normal; no masses,  no organomegaly Extremities: No clubbing cyanosis 2+ edema noted  Lab Results:  Recent Labs  03/21/13 0500  WBC 8.0  HGB 10.0*  PLT 250    Recent Labs  03/21/13 0500  NA 146*  K 3.9  CL 109  CO2 24  GLUCOSE 113*  BUN 30*  CREATININE 2.14*   No results found for this basename: TROPONINI, CK, MB,  in the last 72 hours Hepatic Function Panel  Recent Labs  03/21/13 0500  PROT 7.1  ALBUMIN 3.1*  AST 15  ALT 6  ALKPHOS 67  BILITOT 0.4   No results found for this basename: CHOL,  in the last 72 hours No results found for this basename: PROTIME,  in the last 72 hours  Imaging: Imaging results have been reviewed and No results found.  Cardiac Studies:  Assessment/Plan:  Resolving Right lung pneumonia  Status post septic shock secondary to above  History of staph bacteremia in the past  Hypertension  Decompensated diastolic heart failure  Deconditioning  Insulin requiring diabetes mellitus  Morbid obesity  Acute on chronic kidney disease stage IV improved Anemia  Hypoalbuminemia   Plan As per orders OT PT consult  LOS: 5 days    Jobany Montellano N 03/21/2013, 9:30 AM

## 2013-03-22 ENCOUNTER — Inpatient Hospital Stay (HOSPITAL_COMMUNITY): Payer: Medicare Other

## 2013-03-22 DIAGNOSIS — J9819 Other pulmonary collapse: Secondary | ICD-10-CM

## 2013-03-22 DIAGNOSIS — R918 Other nonspecific abnormal finding of lung field: Secondary | ICD-10-CM

## 2013-03-22 LAB — PRO B NATRIURETIC PEPTIDE: Pro B Natriuretic peptide (BNP): 16464 pg/mL — ABNORMAL HIGH (ref 0–125)

## 2013-03-22 LAB — CBC
HCT: 27.4 % — ABNORMAL LOW (ref 39.0–52.0)
Hemoglobin: 8.9 g/dL — ABNORMAL LOW (ref 13.0–17.0)
MCH: 27.3 pg (ref 26.0–34.0)
MCV: 84 fL (ref 78.0–100.0)
RBC: 3.26 MIL/uL — ABNORMAL LOW (ref 4.22–5.81)

## 2013-03-22 LAB — COMPREHENSIVE METABOLIC PANEL
AST: 15 U/L (ref 0–37)
Albumin: 2.9 g/dL — ABNORMAL LOW (ref 3.5–5.2)
CO2: 26 mEq/L (ref 19–32)
Calcium: 8.5 mg/dL (ref 8.4–10.5)
Creatinine, Ser: 2.18 mg/dL — ABNORMAL HIGH (ref 0.50–1.35)
GFR calc non Af Amer: 29 mL/min — ABNORMAL LOW (ref >90)
Sodium: 146 mEq/L — ABNORMAL HIGH (ref 135–145)
Total Protein: 6.6 g/dL (ref 6.0–8.3)

## 2013-03-22 LAB — GLUCOSE, CAPILLARY
Glucose-Capillary: 138 mg/dL — ABNORMAL HIGH (ref 70–99)
Glucose-Capillary: 212 mg/dL — ABNORMAL HIGH (ref 70–99)
Glucose-Capillary: 143 mg/dL — ABNORMAL HIGH (ref 70–99)
Glucose-Capillary: 144 mg/dL — ABNORMAL HIGH (ref 70–99)

## 2013-03-22 LAB — PROCALCITONIN: Procalcitonin: 1.21 ng/mL

## 2013-03-22 LAB — VANCOMYCIN, RANDOM: Vancomycin Rm: 19 ug/mL

## 2013-03-22 MED ORDER — CARVEDILOL 12.5 MG PO TABS
12.5000 mg | ORAL_TABLET | Freq: Two times a day (BID) | ORAL | Status: DC
  Administered 2013-03-22 – 2013-03-27 (×11): 12.5 mg via ORAL
  Filled 2013-03-22 (×13): qty 1

## 2013-03-22 MED ORDER — METOLAZONE 5 MG PO TABS
5.0000 mg | ORAL_TABLET | Freq: Every day | ORAL | Status: DC
  Administered 2013-03-22 – 2013-03-27 (×6): 5 mg via ORAL
  Filled 2013-03-22 (×6): qty 1

## 2013-03-22 NOTE — Consult Note (Signed)
PULMONARY  / CRITICAL CARE MEDICINE  Name: Justin Skog Sr. MRN: [id number] DOB: Oct 27, 1945    ADMISSION DATE:  03/16/2013 CONSULTATION DATE:  10/15  REFERRING MD :  Sharyn Lull PRIMARY SERVICE:  Algie Coffer   CHIEF COMPLAINT:  Possible pneumonia   BRIEF PATIENT DESCRIPTION:  67 y.o. Male with hx of DM, HTN, HL, CHF here with hypotension and AMS. Admitted with RUL AS dz and dx of HCAP, Hypotension and AMS in 10/9. As of 10/14 resp status had improved. PCCM asked to see in consult for final recs RE: PNA treatment.   SIGNIFICANT EVENTS / STUDIES:    LINES / TUBES:   CULTURES: BCX2 10/9>>> NEG UC 10/9: neg  MRSA PCR 10/10: neg   ANTIBIOTICS: Zosyn 10/9>> 10/15 vanc 10/9>> 10/15  HISTORY OF PRESENT ILLNESS:    67 y.o. Male with hx of DM, HTN, HL, CHF here with hypotension and AMS. Patient was initially admitted 1  week prior  for hypotension and CHF. He was found to be in severe sepsis and no source was found. He discharge yesterday 10/8 to the nursing home. On 10/9 he was found altered and unresponsive. Blood pressure at the nursing home was 79/38. Blood sugar is 140 mg. CXR showed new RUL consolidation. He was admitted to the medical service. Further eval included: pan cultures: all neg, IVFs and empiric antibiotics. His BP improved, AMS resolved and by 10/14 resp status had improved. PCCM asked to see in consult for final recs RE: PNA treatment.    PAST MEDICAL HISTORY :  Past Medical History  Diagnosis Date  . Diabetes mellitus   . Hypertension   . Hyperlipidemia   . CHF (congestive heart failure)   . Enlarged heart   . Pneumonia     hx of    Past Surgical History  Procedure Laterality Date  . Right leg fracture    . Skin graft    . Knee surgery    . Tee without cardioversion  03/17/2012    Procedure: TRANSESOPHAGEAL ECHOCARDIOGRAM (TEE);  Surgeon: Ricki Rodriguez, MD;  Location: [facility] ENDOSCOPY;  Service: Cardiovascular;  Laterality: N/A;   Prior to Admission medications    Medication Sig Start Date End Date Taking? Authorizing Provider  acetaminophen (TYLENOL) 650 MG suppository Place 650 mg rectally every 4 (four) hours as needed for fever.   Yes Historical Provider, MD  aspirin 325 MG tablet Take 325 mg by mouth daily.   Yes Historical Provider, MD  aspirin 81 MG tablet Take 81 mg by mouth daily.   Yes Historical Provider, MD  carvedilol (COREG) 12.5 MG tablet Take 1 tablet (12.5 mg total) by mouth 2 (two) times daily with a meal. 03/14/13  Yes Robynn Pane, MD  furosemide (LASIX) 40 MG tablet Take 40 mg by mouth daily.   Yes Historical Provider, MD  furosemide (LASIX) 80 MG tablet Take 80 mg by mouth daily.   Yes Historical Provider, MD  insulin glargine (LANTUS) 100 UNIT/ML injection Inject 10 Units into the skin at bedtime.   Yes Historical Provider, MD  isosorbide-hydrALAZINE (BIDIL) 20-37.5 MG per tablet Take 1 tablet by mouth 2 (two) times daily. 03/14/13  Yes Robynn Pane, MD  levofloxacin (LEVAQUIN) 250 MG tablet Take 1 tablet (250 mg total) by mouth daily. 03/14/13  Yes Robynn Pane, MD  Multiple Vitamins-Minerals (CENTRUM SILVER ADULT 50+ PO) Take 0.5 tablets by mouth daily.   Yes Historical Provider, MD  pantoprazole (PROTONIX) 40 MG tablet Take 1 tablet (40  mg total) by mouth daily. 03/14/13  Yes Robynn Pane, MD  potassium chloride SA (K-DUR,KLOR-CON) 20 MEQ tablet Take 1 tablet (20 mEq total) by mouth 2 (two) times daily. 03/14/13  Yes Robynn Pane, MD  Tamsulosin HCl (FLOMAX) 0.4 MG CAPS Take 1 capsule (0.4 mg total) by mouth daily. 04/01/12  Yes Robynn Pane, MD  tuberculin (APLISOL) 5 UNIT/0.1ML injection Inject 5 Units into the skin once.    Yes Historical Provider, MD  acetaminophen (TYLENOL) 500 MG tablet Take 500 mg by mouth every 6 (six) hours as needed for pain.    Historical Provider, MD  Menthol-Methyl Salicylate (MUSCLE RUB) 10-15 % CREA Apply 1 application topically as needed (neck and shoulder pain).    Historical Provider,  MD   Allergies  Allergen Reactions  . Ancef [Cefazolin] Itching and Rash    rash  . Sulfa Antibiotics Rash    FAMILY HISTORY:  History reviewed. No pertinent family history. SOCIAL HISTORY:  reports that he quit smoking about 29 years ago. He has never used smokeless tobacco. He reports that he does not drink alcohol or use illicit drugs.  REVIEW OF SYSTEMS:   Constitutional: Negative for fever, chills, weight loss, malaise/fatigue and diaphoresis. feels better HENT: Negative for hearing loss, ear pain, nosebleeds, congestion, sore throat, neck pain, tinnitus and ear discharge.   Eyes: Negative for blurred vision, double vision, photophobia, pain, discharge and redness.  Respiratory: Negative for cough, hemoptysis, sputum production, shortness of breath resolved, wheezing and stridor.   Cardiovascular: Negative for chest pain, palpitations, orthopnea, claudication, leg swelling and PND.  Gastrointestinal: Negative for heartburn, nausea, vomiting, abdominal pain, diarrhea, constipation, blood in stool and melena.  Genitourinary: Negative for dysuria, urgency, frequency, hematuria and flank pain.  Musculoskeletal: Negative for myalgias, back pain, joint pain and falls.  Skin: Negative for itching and rash.  Neurological: Negative for dizziness, tingling, tremors, sensory change, speech change, focal weakness, seizures, loss of consciousness, weakness and headaches.  Endo/Heme/Allergies: Negative for environmental allergies and polydipsia. Does not bruise/bleed easily.  SUBJECTIVE:  Feels better  VITAL SIGNS: Temp:  [97.4 F (36.3 C)-98.2 F (36.8 C)] 98.2 F (36.8 C) (10/15 0801) Pulse Rate:  [88-104] 97 (10/15 1100) Resp:  [10-34] 27 (10/15 1000) BP: (136-182)/(73-94) 176/89 mmHg (10/15 1000) SpO2:  [87 %-99 %] 97 % (10/15 1100) Room air  PHYSICAL EXAMINATION: General:  Chronically ill appearing male, in no acute distress.  Neuro:  Awake, oriented, no focal def  HEENT:  [place] no  JVD  Cardiovascular:  rrr Lungs:  Clear  Abdomen:  Soft, non-tender  Musculoskeletal:  intact Skin:  Dry and flaky    Recent Labs Lab 03/18/13 0530 03/21/13 0500 03/22/13 0513  NA 148* 146* 146*  K 4.7 3.9 3.9  CL 111 109 110  CO2 26 24 26   BUN 48* 30* 27*  CREATININE 3.33* 2.14* 2.18*  GLUCOSE 89 113* 146*    Recent Labs Lab 03/18/13 0530 03/21/13 0500 03/22/13 0513  HGB 9.1* 10.0* 8.9*  HCT 27.9* 31.0* 27.4*  WBC 9.9 8.0 7.0  PLT 205 250 234   No results found.  CXR: Complete resolution of RUL atelectasis  ASSESSMENT / PLAN: HCAP.  Appears to be resolving clinically. Now on day # 6 vanc and zosyn Rec: -check f/u CXR -check PCT -if normal could stop abx after 7d rx. If remains elevated would complete 10d course.   Zenia Resides, ACNP Pulmonary and Critical Care Medicine [facility] [place] Pager: [phone]  03/22/2013, 11:36 AM    PCCM ATTENDING: I have interviewed and examined the patient and reviewed the database. I have formulated the assessment and plan as reflected in the note above with amendments made by me.   His presentation was nonspecific. The diagnosis of PNA was based on CXR findings of RUL AS dz. Current CXR reveals complete resolution of RUL opacity suggesting that this was atelectasis, most likely on basis of mucus plugging. All cultures remain negative. I have discontinued all antibiotics.   He is quite frail and has had repeated hospitalizations recently. It might make sense to pursue NHP or more health care assistance in the home   PCCM will sign off. Please call if we can be of further assistance  Billy Fischer, MD;  PCCM service; Mobile [phone]

## 2013-03-22 NOTE — Clinical Social Work Note (Addendum)
CSW called pt's wife again to follow-up concerning possible SNF placement--pt's wife asked CSW to get back in touch with [facility] and let them know that she would like to send pt back to this facility. However, pt's wife also asked if CSW could send his clinicals to [facility], and if they have a private room available at [facility] pt's family would like him to go there. CSW has sent clinicals to [facility] and will update pt's wife once facility responds to CSW request. CSW has also called [facility] and alerted them that pt's wife would like pt to return.    Maryclare Labrador, MSW, [facility] Clinical Social Worker [phone]

## 2013-03-22 NOTE — Clinical Social Work Note (Signed)
CSW called pt's wife and left her a voicemail, requesting she give CSW a call back to talk about potential SNF placement. CSW provided her phone number and has invited pt's wife to call.    Maryclare Labrador, MSW, [facility] Clinical Social Worker [phone]

## 2013-03-22 NOTE — Evaluation (Signed)
Occupational Therapy Evaluation Patient Details Name: Justin Steinfeldt Sr. MRN: [id number] DOB: 22-Sep-1945 Today's Date: 03/22/2013 Time: 4098-1191 OT Time Calculation (min): 34 min  OT Assessment / Plan / Recommendation History of present illness Pt readmitted from brief stay at SNF with PNA after discharge last week with CHF and weakness. 78M with hx of diastolic CHF, CRI, DM II, Htn, hyperlipidemia decompensated CHF, poorly controlled htn.    Clinical Impression   Pt admitted with above.  Pt presents to OT with the below listed deficits.  He requires max encouragement to participate in ADL activities.  He will benefit from continued OT to maximize safety and independence with BADLs to allow him to return home at supervision level after SNF level rehab     OT Assessment  Patient needs continued OT Services    Follow Up Recommendations  SNF    Barriers to Discharge      Equipment Recommendations  None recommended by OT    Recommendations for Other Services    Frequency  Min 2X/week    Precautions / Restrictions Precautions Precautions: Fall   Pertinent Vitals/Pain     ADL  Eating/Feeding: Set up Where Assessed - Eating/Feeding: Chair Grooming: Wash/dry face;Wash/dry hands;Set up Where Assessed - Grooming: Supported sitting Upper Body Bathing: Minimal assistance Where Assessed - Upper Body Bathing: Supported sitting Lower Body Bathing: Maximal assistance Where Assessed - Lower Body Bathing: Supported sit to stand Upper Body Dressing: Moderate assistance Where Assessed - Upper Body Dressing: Unsupported sitting Lower Body Dressing: +1 Total assistance Where Assessed - Lower Body Dressing: Supported sit to stand Toilet Transfer: Minimal assistance Toilet Transfer Method: Sit to stand;Stand pivot Acupuncturist: Bedside commode Toileting - Clothing Manipulation and Hygiene: Maximal assistance Where Assessed - Toileting Clothing Manipulation and Hygiene:  Standing Transfers/Ambulation Related to ADLs: sit to stand with min A ADL Comments: Pt incontinent of stool with no awareness.  Pt moved into standing position to assist with clean up/peri care.  Pt with small open area on buttocks - barrier cream applied and RN notified.  Pt. complaining that he hasn't had a bath since he was admitted; however, RN reports he has had baths.  Pt. stood x ~12 Mins, and then fatigued insisting on sitting in chair.  Pt refused all other attempts at self care activities, or mobility stating he is weak.  Attempted to encourage pt to participate further, but he adamantly refused.  Wife entered at end of session, and pt informed her that this therapist was lazy that I wouldn't walk with him.  Explained to wife, that pt had refused further activity with therapist due to fatigue.     OT Diagnosis: Generalized weakness;Cognitive deficits  OT Problem List: Decreased strength;Decreased activity tolerance;Impaired balance (sitting and/or standing);Decreased cognition;Decreased safety awareness;Decreased knowledge of use of DME or AE OT Treatment Interventions: Self-care/ADL training;Therapeutic exercise;Therapeutic activities;Patient/family education;Balance training   OT Goals(Current goals can be found in the care plan section) Acute Rehab OT Goals Patient Stated Goal: to get better OT Goal Formulation: With patient Time For Goal Achievement: 03/29/13 Potential to Achieve Goals: Good ADL Goals Pt Will Perform Grooming: with min guard assist;standing Pt Will Perform Upper Body Bathing: with supervision;with min guard assist;sitting Pt Will Perform Lower Body Bathing: with min guard assist;sit to/from stand Pt Will Perform Upper Body Dressing: with supervision;sitting Pt Will Perform Lower Body Dressing: with min guard assist;sit to/from stand Pt Will Transfer to Toilet: with min guard assist;ambulating  Visit Information  Last OT Received On:  03/22/13 Assistance Needed:  +1 History of Present Illness: Pt readmitted from brief stay at SNF with PNA after discharge last week with CHF and weakness. 77M with hx of diastolic CHF, CRI, DM II, Htn, hyperlipidemia decompensated CHF, poorly controlled htn.        Prior Functioning     Home Living Family/patient expects to be discharged to:: Skilled nursing facility Living Arrangements: Spouse/significant other Additional Comments: hospital bed Prior Function Level of Independence: Needs assistance ADL's / Homemaking Assistance Needed: pt reports he has required assist with all ADLS.  He states that if his wife is home, she will help him Communication Communication: No difficulties Dominant Hand: Right         Vision/Perception     Cognition  Cognition Arousal/Alertness: Awake/alert Behavior During Therapy: WFL for tasks assessed/performed Overall Cognitive Status: Impaired/Different from baseline Area of Impairment: Orientation;Memory;Awareness;Safety/judgement;Problem solving Orientation Level: Time Memory: Decreased short-term memory Safety/Judgement: Decreased awareness of deficits Awareness: Intellectual Problem Solving: Slow processing;Requires verbal cues General Comments: Pt insistent that he has not had a bath since he has been here, however, upon talking to RN, pt has received baths.     Extremity/Trunk Assessment Upper Extremity Assessment Upper Extremity Assessment: Generalized weakness Lower Extremity Assessment Lower Extremity Assessment: Generalized weakness Cervical / Trunk Assessment Cervical / Trunk Assessment: Normal     Mobility Bed Mobility Bed Mobility: Supine to Sit;Sitting - Scoot to Edge of Bed Supine to Sit: 4: Min assist;With rails;HOB elevated Sitting - Scoot to Edge of Bed: 4: Min assist;With rail Details for Bed Mobility Assistance: step by step cues.  assist to lift shoulders from bed Transfers Transfers: Sit to Stand;Stand to Sit Sit to Stand: 4: Min  assist;With upper extremity assist;From bed Stand to Sit: 4: Min assist;With upper extremity assist;To bed;To chair/3-in-1 Details for Transfer Assistance: assist to control descent when pivoting to chair.  Pt "flopped" in chair due to fatigue     Exercise     Balance Balance Balance Assessed: Yes Static Standing Balance Static Standing - Balance Support: Bilateral upper extremity supported Static Standing - Level of Assistance: 4: Min assist   End of Session OT - End of Session Activity Tolerance: Patient limited by fatigue Patient left: in chair;with call bell/phone within reach;with family/visitor present Nurse Communication: Mobility status  GO     [name] 03/22/2013, 4:50 PM

## 2013-03-22 NOTE — Clinical Social Work Placement (Signed)
Clinical Social Work Department CLINICAL SOCIAL WORK PLACEMENT NOTE 03/22/2013  Patient:  [name], [name]  Account Number:  [id number] Admit date:  03/16/2013  Clinical Social Worker:  Maryclare Labrador, Theresia Majors  Date/time:  03/22/2013 02:45 PM  Clinical Social Work is seeking post-discharge placement for this patient at the following level of care:   SKILLED NURSING   (*CSW will update this form in Epic as items are completed)   03/22/2013  Patient/family provided with [name] Department of Clinical Social Work's list of facilities offering this level of care within the geographic area requested by the patient (or if unable, by the patient's family).  03/22/2013  Patient/family informed of their freedom to choose among providers that offer the needed level of care, that participate in Medicare, Medicaid or managed care program needed by the patient, have an available bed and are willing to accept the patient.  03/22/2013  Patient/family informed of [facility]' ownership interest in [facility], as well as of the fact that they are under no obligation to receive care at this facility.  PASARR submitted to EDS on  PASARR number received from EDS on   FL2 transmitted to all facilities in geographic area requested by pt/family on  03/22/2013 FL2 transmitted to all facilities within larger geographic area on   Patient informed that his/her managed care company has contracts with or will negotiate with  certain facilities, including the following:     Patient/family informed of bed offers received:   Patient chooses bed at  Physician recommends and patient chooses bed at    Patient to be transferred to  on   Patient to be transferred to facility by   The following physician request were entered in Epic:   Additional Comments:     Maryclare Labrador, MSW, [facility] Clinical Social Worker [phone]

## 2013-03-22 NOTE — Progress Notes (Signed)
Subjective:  Patient denies any chest pain states breathing is improved. Complains of generalized weakness leg swelling improved at the IV albumin and Lasix  Objective:  Vital Signs in the last 24 hours: Temp:  [97.4 F (36.3 C)-98.2 F (36.8 C)] 98.2 F (36.8 C) (10/15 0801) Pulse Rate:  [88-104] 97 (10/15 1100) Resp:  [10-34] 27 (10/15 1000) BP: (136-182)/(73-94) 176/89 mmHg (10/15 1000) SpO2:  [87 %-99 %] 97 % (10/15 1100)  Intake/Output from previous day: 10/14 0701 - 10/15 0700 In: 250 [IV Piggyback:250] Out: 1600 [Urine:1600] Intake/Output from this shift: Total I/O In: -  Out: 775 [Urine:775]  Physical Exam: Neck: no adenopathy, no carotid bruit, no JVD and supple, symmetrical, trachea midline Lungs: Decreased breath sound at bases with occasional right rhonchi air entry improved Heart: regular rate and rhythm, S1, S2 normal and Soft systolic murmur and S3 gallop noted Abdomen: soft, non-tender; bowel sounds normal; no masses,  no organomegaly Extremities: No clubbing cyanosis 2+ edema noted diffuse scaling of the skin noted  Lab Results:  Recent Labs  03/21/13 0500 03/22/13 0513  WBC 8.0 7.0  HGB 10.0* 8.9*  PLT 250 234    Recent Labs  03/21/13 0500 03/22/13 0513  NA 146* 146*  K 3.9 3.9  CL 109 110  CO2 24 26  GLUCOSE 113* 146*  BUN 30* 27*  CREATININE 2.14* 2.18*   No results found for this basename: TROPONINI, CK, MB,  in the last 72 hours Hepatic Function Panel  Recent Labs  03/22/13 0513  PROT 6.6  ALBUMIN 2.9*  AST 15  ALT 7  ALKPHOS 75  BILITOT 0.3   No results found for this basename: CHOL,  in the last 72 hours No results found for this basename: PROTIME,  in the last 72 hours  Imaging: Imaging results have been reviewed and No results found.  Cardiac Studies:  Assessment/Plan:  Resolving Right lung pneumonia  Status post septic shock secondary to above  History of staph bacteremia in the past  Hypertension  uncontrolled Decompensated diastolic heart failure  Deconditioning  Insulin requiring diabetes mellitus  Morbid obesity  Acute on chronic kidney disease stage IV improved  Anemia of chronic disease Hypoalbuminemia  Plan Increase carvedilol as per orders. Start Zaroxolyn Check labs in a.m.  LOS: 6 days    [name] 03/22/2013, 11:44 AM

## 2013-03-23 LAB — CULTURE, BLOOD (ROUTINE X 2): Culture: NO GROWTH

## 2013-03-23 LAB — COMPREHENSIVE METABOLIC PANEL
AST: 17 U/L (ref 0–37)
Alkaline Phosphatase: 91 U/L (ref 39–117)
CO2: 28 mEq/L (ref 19–32)
Calcium: 8.9 mg/dL (ref 8.4–10.5)
Creatinine, Ser: 2.13 mg/dL — ABNORMAL HIGH (ref 0.50–1.35)
GFR calc Af Amer: 35 mL/min — ABNORMAL LOW (ref >90)
GFR calc non Af Amer: 30 mL/min — ABNORMAL LOW (ref >90)
Glucose, Bld: 195 mg/dL — ABNORMAL HIGH (ref 70–99)
Potassium: 4 mEq/L (ref 3.5–5.1)
Total Protein: 7.2 g/dL (ref 6.0–8.3)

## 2013-03-23 LAB — GLUCOSE, CAPILLARY
Glucose-Capillary: 131 mg/dL — ABNORMAL HIGH (ref 70–99)
Glucose-Capillary: 142 mg/dL — ABNORMAL HIGH (ref 70–99)

## 2013-03-23 LAB — CBC
HCT: 29.7 % — ABNORMAL LOW (ref 39.0–52.0)
Hemoglobin: 9.5 g/dL — ABNORMAL LOW (ref 13.0–17.0)
MCHC: 32 g/dL (ref 30.0–36.0)
MCV: 84.4 fL (ref 78.0–100.0)
RBC: 3.52 MIL/uL — ABNORMAL LOW (ref 4.22–5.81)

## 2013-03-23 LAB — PRO B NATRIURETIC PEPTIDE: Pro B Natriuretic peptide (BNP): 20208 pg/mL — ABNORMAL HIGH (ref 0–125)

## 2013-03-23 MED ORDER — ALBUMIN HUMAN 25 % IV SOLN
25.0000 g | Freq: Once | INTRAVENOUS | Status: AC
  Administered 2013-03-23: 25 g via INTRAVENOUS
  Filled 2013-03-23: qty 100

## 2013-03-23 NOTE — Clinical Social Work Note (Signed)
CSW informed wife that [facility] does not have a private room available. Pt's wife asked if a private room is available at [facility]. CSW called [facility] and they are attempting to arrange for a private room for pt. CSW informed pt's wife. CSW continues to follow case and will facilitate discharge to [facility] when pt is medically ready.    Maryclare Labrador, MSW, [facility] Clinical Social Worker [phone]

## 2013-03-23 NOTE — Progress Notes (Signed)
Inpatient Diabetes Program Recommendations  AACE/ADA: New Consensus Statement on Inpatient Glycemic Control (2013)  Target Ranges:  Prepandial:   less than 140 mg/dL      Peak postprandial:   less than 180 mg/dL (1-2 hours)      Critically ill patients:  140 - 180 mg/dL     Results for [name], Justin SR. (MRN [id number]) as of 03/23/2013 09:20  Ref. Range 03/22/2013 08:00 03/22/2013 12:27 03/22/2013 17:17 03/22/2013 20:33  Glucose-Capillary Latest Range: 70-99 mg/dL 191 (H) 478 (H) 295 (H) 144 (H)   Patient with history of Type 2 DM.   Inpatient Diabetes Program Recommendations Correction (SSI): MD- Please start Novolog Sensitive correction scale (SSI) tid ac + HS   Will follow. Ambrose Finland RN, MSN, CDE Diabetes Coordinator Inpatient Diabetes Program Team Pager: [phone] (8a-10p)

## 2013-03-23 NOTE — Progress Notes (Signed)
Physical Therapy Treatment Patient Details Name: Justin Rodas Sr. MRN: [id number] DOB: 09-19-1945 Today's Date: 03/23/2013 Time: 1191-4782 PT Time Calculation (min): 39 min  PT Assessment / Plan / Recommendation  History of Present Illness Pt readmitted from brief stay at SNF with PNA after discharge last week with CHF and weakness. 39M with hx of diastolic CHF, CRI, DM II, Htn, hyperlipidemia decompensated CHF, poorly controlled htn.    PT Comments   Pt admitted with above. Pt currently with functional limitations due to continued weakness and balance deficits. Pt will benefit from skilled PT to increase their independence and safety with mobility to allow discharge to the venue listed below.   Follow Up Recommendations  SNF;Supervision/Assistance - 24 hour                 Equipment Recommendations  None recommended by PT    Recommendations for Other Services OT consult  Frequency Min 2X/week   Progress towards PT Goals Progress towards PT goals: Progressing toward goals  Plan Current plan remains appropriate    Precautions / Restrictions Precautions Precautions: Fall Precaution Comments: painful right shoulder with limited ROM Restrictions Weight Bearing Restrictions: No   Pertinent Vitals/Pain VSS, no pain    Mobility  Bed Mobility Bed Mobility: Supine to Sit;Sitting - Scoot to [place] of Bed;Rolling Right;Rolling Left Rolling Right: 4: Min assist Rolling Left: 4: Min assist Right Sidelying to Sit: 3: Mod assist;HOB flat;With rails Left Sidelying to Sit: Not tested (comment) Supine to Sit: 3: Mod assist Sitting - Scoot to Edge of Bed: 4: Min assist;With rail Details for Bed Mobility Assistance: Pt heavily soiled on arrival.  NT came in and assisted PT wwith cleaning pt of stool and urine.  step by step cues.  assist to lift shoulders from bed Transfers Transfers: Sit to Stand;Stand to Sit Sit to Stand: With upper extremity assist;From bed;3: Mod assist Stand to Sit:  4: Min assist;With upper extremity assist;To bed;To chair/3-in-1 Details for Transfer Assistance: Cues for hand placement with pt needing incr assist and cues to place feet for standing.  Needs assist to control descent.   Ambulation/Gait Ambulation/Gait Assistance: 4: Min guard Ambulation Distance (Feet): 90 Feet (90 feet x2) Assistive device: Rolling walker Ambulation/Gait Assistance Details: Cues for posture, position in RW and for safety. Gait Pattern: Step-through pattern;Decreased stride length;Trunk flexed Gait velocity: decreased Stairs: No Wheelchair Mobility Wheelchair Mobility: No    PT Goals (current goals can now be found in the care plan section)    Visit Information  Last PT Received On: 03/23/13 Assistance Needed: +1 History of Present Illness: Pt readmitted from brief stay at SNF with PNA after discharge last week with CHF and weakness. 39M with hx of diastolic CHF, CRI, DM II, Htn, hyperlipidemia decompensated CHF, poorly controlled htn.     Subjective Data  Subjective: "I feel like I am wet and dirty."   Cognition  Cognition Arousal/Alertness: Awake/alert Behavior During Therapy: WFL for tasks assessed/performed Overall Cognitive Status: Impaired/Different from baseline Area of Impairment: Orientation;Memory;Awareness;Safety/judgement;Problem solving Orientation Level: Time Memory: Decreased short-term memory Safety/Judgement: Decreased awareness of deficits Awareness: Intellectual Problem Solving: Slow processing;Requires verbal cues General Comments: Pt insistent that he issupposed to  have a bath twice a day.    Balance  Balance Balance Assessed: Yes Static Standing Balance Static Standing - Balance Support: Bilateral upper extremity supported Static Standing - Level of Assistance: 4: Min assist Static Standing - Comment/# of Minutes: 3 min with RW needing cues for posture and steadying  assist.  End of Session PT - End of Session Equipment Utilized  During Treatment: Gait belt Activity Tolerance: Patient limited by fatigue Patient left: in chair;with call bell/phone within reach Nurse Communication: Mobility status       INGOLD,Lourdes Kucharski 03/23/2013, 1:04 PM [facility] [phone] (pager)

## 2013-03-23 NOTE — Progress Notes (Signed)
Subjective:  Patient denies any chest pain or shortness of breath. Complains of generalized weakness  Objective:  Vital Signs in the last 24 hours: Temp:  [91 F (32.8 C)-98.5 F (36.9 C)] 97.8 F (36.6 C) (10/16 0732) Pulse Rate:  [88-97] 90 (10/16 0732) Resp:  [16-27] 20 (10/16 0732) BP: (109-176)/(58-89) 118/58 mmHg (10/16 0732) SpO2:  [89 %-98 %] 91 % (10/16 0732) Weight:  [104.3 kg (229 lb 15 oz)] 104.3 kg (229 lb 15 oz) (10/16 0500)  Intake/Output from previous day: 10/15 0701 - 10/16 0700 In: 118 [P.O.:118] Out: 2805 [Urine:2805] Intake/Output from this shift: Total I/O In: 120 [P.O.:120] Out: -   Physical Exam: Neck: no adenopathy, no carotid bruit, no JVD and supple, symmetrical, trachea midline Lungs: Decreased breath sound at bases Heart: regular rate and rhythm, S1, S2 normal and Soft. Murmur and S3 gallop noted Abdomen: soft, non-tender; bowel sounds normal; no masses,  no organomegaly Extremities: No clubbing cyanosis 3+ edema noted  Lab Results:  Recent Labs  03/22/13 0513 03/23/13 0450  WBC 7.0 6.9  HGB 8.9* 9.5*  PLT 234 263    Recent Labs  03/22/13 0513 03/23/13 0450  NA 146* 146*  K 3.9 4.0  CL 110 107  CO2 26 28  GLUCOSE 146* 195*  BUN 27* 27*  CREATININE 2.18* 2.13*   No results found for this basename: TROPONINI, CK, MB,  in the last 72 hours Hepatic Function Panel  Recent Labs  03/23/13 0450  PROT 7.2  ALBUMIN 3.1*  AST 17  ALT 9  ALKPHOS 91  BILITOT 0.3   No results found for this basename: CHOL,  in the last 72 hours No results found for this basename: PROTIME,  in the last 72 hours  Imaging: Imaging results have been reviewed and Dg Chest Port 1 View  03/22/2013   CLINICAL DATA:  Pneumonia.  EXAM: PORTABLE CHEST - 1 VIEW  COMPARISON:  CHEST x-ray 03/16/2013.  FINDINGS: Previously noted right upper lobe consolidation and atelectasis has resolved. No residual right upper lobe opacity is identified on today's examination.  Lung volumes are normal. No consolidative airspace disease. No pleural effusions. No pneumothorax. No pulmonary nodule or mass noted. Pulmonary vasculature and the cardiomediastinal silhouette are within normal limits.  IMPRESSION: 1. Complete resolution of previously noted right upper lobe atelectasis/consolidation.   Electronically Signed   By: Trudie Reed M.D.   On: 03/22/2013 13:26    Cardiac Studies:  Assessment/Plan:  Status post Right lung pneumonia  Status post septic shock secondary to above  History of staph bacteremia in the past  Hypertension uncontrolled  Decompensated diastolic heart failure  Deconditioning  Insulin requiring diabetes mellitus  Morbid obesity  Acute on chronic kidney disease stage IV improved  Anemia of chronic disease  Hypoalbuminemia  Plan As per orders Awaiting skilled nursing facility  LOS: 7 days    [name] N 03/23/2013, 9:28 AM

## 2013-03-24 LAB — COMPREHENSIVE METABOLIC PANEL
ALT: 7 U/L (ref 0–53)
AST: 13 U/L (ref 0–37)
Albumin: 2.8 g/dL — ABNORMAL LOW (ref 3.5–5.2)
Alkaline Phosphatase: 76 U/L (ref 39–117)
CO2: 27 mEq/L (ref 19–32)
GFR calc Af Amer: 38 mL/min — ABNORMAL LOW (ref >90)
Glucose, Bld: 183 mg/dL — ABNORMAL HIGH (ref 70–99)
Potassium: 3.9 mEq/L (ref 3.5–5.1)
Sodium: 148 mEq/L — ABNORMAL HIGH (ref 135–145)
Total Bilirubin: 0.2 mg/dL — ABNORMAL LOW (ref 0.3–1.2)
Total Protein: 6.3 g/dL (ref 6.0–8.3)

## 2013-03-24 LAB — PRO B NATRIURETIC PEPTIDE: Pro B Natriuretic peptide (BNP): 20835 pg/mL — ABNORMAL HIGH (ref 0–125)

## 2013-03-24 LAB — CBC
Hemoglobin: 8.7 g/dL — ABNORMAL LOW (ref 13.0–17.0)
MCHC: 32.6 g/dL (ref 30.0–36.0)
Platelets: 251 10*3/uL (ref 150–400)
RBC: 3.16 MIL/uL — ABNORMAL LOW (ref 4.22–5.81)

## 2013-03-24 LAB — GLUCOSE, CAPILLARY
Glucose-Capillary: 158 mg/dL — ABNORMAL HIGH (ref 70–99)
Glucose-Capillary: 182 mg/dL — ABNORMAL HIGH (ref 70–99)
Glucose-Capillary: 220 mg/dL — ABNORMAL HIGH (ref 70–99)

## 2013-03-24 MED ORDER — GLUCERNA SHAKE PO LIQD: 237.0000 mL | Freq: Every day | ORAL | Status: DC | PRN

## 2013-03-24 MED ORDER — ALBUMIN HUMAN 25 % IV SOLN
25.0000 g | Freq: Once | INTRAVENOUS | Status: AC
  Administered 2013-03-24: 25 g via INTRAVENOUS
  Filled 2013-03-24: qty 100

## 2013-03-24 NOTE — Progress Notes (Signed)
VAS team in to restart patient's IV.  Patient refused restart. VAS team will try again tomorrow.

## 2013-03-24 NOTE — Progress Notes (Signed)
NUTRITION FOLLOW UP  Intervention:    Glucerna Shake daily PRN (220 kcals, 10 gm protein per 8 fl oz bottle) RD to follow for nutrition care plan  New Nutrition Dx:   Increased nutrient needs related to wound healing as evidenced by estimated nutrition needs, ongoing  Goal:   Pt to meet >/= 90% of their estimated nutrition needs, met  Monitor:   PO intake, weight, labs, I/O's  Assessment:   Patient with PMH of DM, HTN, HL, and CHF; recent admission for septic shock and nausea/vomiting discharged on 10/9; re-admitted from SNF with hypotension and AMS.   Patient s/p bedside swallow evaluation 10/14 -- presented with normal swallow function.  Currently on a Heart Healthy diet with good appetite.  PO intake 75-100% intake per flowsheet records.  Disposition: SNF placement when medically stable.  Height: Ht Readings from Last 1 Encounters:  03/17/13 5\' 11"  (1.803 m)    Weight Status:   Wt Readings from Last 1 Encounters:  03/23/13 229 lb 15 oz (104.3 kg)    Re-estimated needs:  Kcal: 1900-2100 Protein: 95-105 gm Fluid: 1.9-2.1 L  Skin: pressure ulcer Stage II on buttocks  Diet Order: Cardiac   Intake/Output Summary (Last 24 hours) at 03/24/13 1051 Last data filed at 03/23/13 1103  Gross per 24 hour  Intake    100 ml  Output      0 ml  Net    100 ml    Labs:e   Recent Labs Lab 03/22/13 0513 03/23/13 0450 03/24/13 0415  NA 146* 146* 148*  K 3.9 4.0 3.9  CL 110 107 110  CO2 26 28 27   BUN 27* 27* 27*  CREATININE 2.18* 2.13* 2.00*  CALCIUM 8.5 8.9 8.3*  GLUCOSE 146* 195* 183*    CBG (last 3)   Recent Labs  03/23/13 1226 03/23/13 1706 03/23/13 2153  GLUCAP 142* 209* 214*    Scheduled Meds: . albuterol  2.5 mg Nebulization TID  . aspirin  325 mg Oral Daily  . carvedilol  12.5 mg Oral BID WC  . furosemide  40 mg Intravenous BID  . heparin  5,000 Units Subcutaneous Q8H  . ipratropium  0.5 mg Nebulization TID  . metolazone  5 mg Oral Daily  .  pantoprazole  40 mg Oral Daily  . sodium chloride  3 mL Intravenous Q12H    Continuous Infusions:   Maureen Chatters, RD, LDN Pager #: [phone] After-Hours Pager #: [phone]

## 2013-03-24 NOTE — Progress Notes (Signed)
Subjective:  Patient states he feels better today denies any chest pain states breathing is improved leg swelling slowly going down albumin remains low started on protein shakes Objective:  Vital Signs in the last 24 hours: Temp:  [97.8 F (36.6 C)-98.5 F (36.9 C)] 98.5 F (36.9 C) (10/17 0810) Pulse Rate:  [82-103] 96 (10/17 0810) Resp:  [16-28] 28 (10/17 0810) BP: (120-177)/(64-94) 177/76 mmHg (10/17 0952) SpO2:  [92 %-100 %] 94 % (10/17 0810)  Intake/Output from previous day: 10/16 0701 - 10/17 0700 In: 220 [P.O.:120; IV Piggyback:100] Out: -  Intake/Output from this shift:    Physical Exam: Neck: no adenopathy, no carotid bruit, no JVD and supple, symmetrical, trachea midline Lungs: Decreased breath sound at bases Heart: regular rate and rhythm, S1, S2 normal and Soft systolic murmur noted Abdomen: soft, non-tender; bowel sounds normal; no masses,  no organomegaly Extremities: No clubbing cyanosis 1+ edema noted  Lab Results:  Recent Labs  03/23/13 0450 03/24/13 0415  WBC 6.9 6.0  HGB 9.5* 8.7*  PLT 263 251    Recent Labs  03/23/13 0450 03/24/13 0415  NA 146* 148*  K 4.0 3.9  CL 107 110  CO2 28 27  GLUCOSE 195* 183*  BUN 27* 27*  CREATININE 2.13* 2.00*   No results found for this basename: TROPONINI, CK, MB,  in the last 72 hours Hepatic Function Panel  Recent Labs  03/24/13 0415  PROT 6.3  ALBUMIN 2.8*  AST 13  ALT 7  ALKPHOS 76  BILITOT 0.2*   No results found for this basename: CHOL,  in the last 72 hours No results found for this basename: PROTIME,  in the last 72 hours  Imaging: Imaging results have been reviewed and Dg Chest Port 1 View  03/22/2013   CLINICAL DATA:  Pneumonia.  EXAM: PORTABLE CHEST - 1 VIEW  COMPARISON:  CHEST x-ray 03/16/2013.  FINDINGS: Previously noted right upper lobe consolidation and atelectasis has resolved. No residual right upper lobe opacity is identified on today's examination. Lung volumes are normal. No  consolidative airspace disease. No pleural effusions. No pneumothorax. No pulmonary nodule or mass noted. Pulmonary vasculature and the cardiomediastinal silhouette are within normal limits.  IMPRESSION: 1. Complete resolution of previously noted right upper lobe atelectasis/consolidation.   Electronically Signed   By: Trudie Reed M.D.   On: 03/22/2013 13:26    Cardiac Studies:  Assessment/Plan:  Status post Right lung pneumonia  Status post septic shock secondary to above  History of staph bacteremia in the past  Hypertension uncontrolled  Decompensated diastolic heart failure  Deconditioning  Insulin requiring diabetes mellitus  Morbid obesity  Acute on chronic kidney disease stage IV improved  Anemia of chronic disease  Hypoalbuminemia  Plan As per orders Increase ambulation Hopefully to skilled nursing facility tomorrow if room available  LOS: 8 days    Tameia Rafferty N 03/24/2013, 12:06 PM

## 2013-03-25 LAB — GLUCOSE, CAPILLARY
Glucose-Capillary: 176 mg/dL — ABNORMAL HIGH (ref 70–99)
Glucose-Capillary: 220 mg/dL — ABNORMAL HIGH (ref 70–99)

## 2013-03-25 LAB — COMPREHENSIVE METABOLIC PANEL WITH GFR
ALT: 7 U/L (ref 0–53)
AST: 12 U/L (ref 0–37)
Albumin: 3.4 g/dL — ABNORMAL LOW (ref 3.5–5.2)
Alkaline Phosphatase: 73 U/L (ref 39–117)
BUN: 27 mg/dL — ABNORMAL HIGH (ref 6–23)
CO2: 31 meq/L (ref 19–32)
Calcium: 9 mg/dL (ref 8.4–10.5)
Chloride: 106 meq/L (ref 96–112)
Creatinine, Ser: 1.96 mg/dL — ABNORMAL HIGH (ref 0.50–1.35)
GFR calc Af Amer: 39 mL/min — ABNORMAL LOW
GFR calc non Af Amer: 33 mL/min — ABNORMAL LOW
Glucose, Bld: 159 mg/dL — ABNORMAL HIGH (ref 70–99)
Potassium: 3.8 meq/L (ref 3.5–5.1)
Sodium: 147 meq/L — ABNORMAL HIGH (ref 135–145)
Total Bilirubin: 0.3 mg/dL (ref 0.3–1.2)
Total Protein: 7.3 g/dL (ref 6.0–8.3)

## 2013-03-25 LAB — CBC
HCT: 30.2 % — ABNORMAL LOW (ref 39.0–52.0)
Hemoglobin: 9.5 g/dL — ABNORMAL LOW (ref 13.0–17.0)
MCH: 26.7 pg (ref 26.0–34.0)
MCHC: 31.5 g/dL (ref 30.0–36.0)
MCV: 84.8 fL (ref 78.0–100.0)
Platelets: 291 K/uL (ref 150–400)
RBC: 3.56 MIL/uL — ABNORMAL LOW (ref 4.22–5.81)
RDW: 16.5 % — ABNORMAL HIGH (ref 11.5–15.5)
WBC: 6.5 K/uL (ref 4.0–10.5)

## 2013-03-25 MED ORDER — ALBUTEROL SULFATE (5 MG/ML) 0.5% IN NEBU
2.5000 mg | INHALATION_SOLUTION | Freq: Four times a day (QID) | RESPIRATORY_TRACT | Status: DC | PRN
  Administered 2013-03-26 – 2013-03-27 (×2): 2.5 mg via RESPIRATORY_TRACT
  Filled 2013-03-25 (×2): qty 0.5

## 2013-03-25 MED ORDER — INSULIN ASPART 100 UNIT/ML ~~LOC~~ SOLN
0.0000 [IU] | Freq: Three times a day (TID) | SUBCUTANEOUS | Status: DC
  Administered 2013-03-26 – 2013-03-27 (×4): 2 [IU] via SUBCUTANEOUS
  Administered 2013-03-27: 1 [IU] via SUBCUTANEOUS
  Administered 2013-03-27: 17:00:00 2 [IU] via SUBCUTANEOUS

## 2013-03-25 MED ORDER — FUROSEMIDE 80 MG PO TABS
80.0000 mg | ORAL_TABLET | Freq: Two times a day (BID) | ORAL | Status: DC
  Administered 2013-03-25 – 2013-03-27 (×5): 80 mg via ORAL
  Filled 2013-03-25 (×7): qty 1

## 2013-03-25 MED ORDER — INSULIN GLARGINE 100 UNIT/ML ~~LOC~~ SOLN
6.0000 [IU] | Freq: Every day | SUBCUTANEOUS | Status: DC
  Administered 2013-03-25: 6 [IU] via SUBCUTANEOUS
  Filled 2013-03-25 (×2): qty 0.06

## 2013-03-25 NOTE — Progress Notes (Signed)
Subjective:  Patient denies any chest pain or shortness of breath. Complains of generalized weakness  Objective:  Vital Signs in the last 24 hours: Temp:  [97.6 F (36.4 C)-98.8 F (37.1 C)] 98.5 F (36.9 C) (10/18 0818) Pulse Rate:  [80-102] 80 (10/18 0818) Resp:  [13-25] 13 (10/18 0818) BP: (157-180)/(67-124) 165/124 mmHg (10/18 0818) SpO2:  [90 %-98 %] 97 % (10/18 0843) Weight:  [102.6 kg (226 lb 3.1 oz)] 102.6 kg (226 lb 3.1 oz) (10/18 0500)  Intake/Output from previous day: 10/17 0701 - 10/18 0700 In: 960 [P.O.:960] Out: 1475 [Urine:1475] Intake/Output from this shift:    Physical Exam: Neck: no adenopathy, no carotid bruit, no JVD and supple, symmetrical, trachea midline Lungs: Decreased breath sound at bases Heart: regular rate and rhythm, S1, S2 normal and Soft systolic murmur noted Abdomen: soft, non-tender; bowel sounds normal; no masses,  no organomegaly Extremities: No clubbing cyanosis 2+ edema noted  Lab Results:  Recent Labs  03/24/13 0415 03/25/13 0415  WBC 6.0 6.5  HGB 8.7* 9.5*  PLT 251 291    Recent Labs  03/24/13 0415 03/25/13 0415  NA 148* 147*  K 3.9 3.8  CL 110 106  CO2 27 31  GLUCOSE 183* 159*  BUN 27* 27*  CREATININE 2.00* 1.96*   No results found for this basename: TROPONINI, CK, MB,  in the last 72 hours Hepatic Function Panel  Recent Labs  03/25/13 0415  PROT 7.3  ALBUMIN 3.4*  AST 12  ALT 7  ALKPHOS 73  BILITOT 0.3   No results found for this basename: CHOL,  in the last 72 hours No results found for this basename: PROTIME,  in the last 72 hours  Imaging: Imaging results have been reviewed and No results found.  Cardiac Studies:  Assessment/Plan:  Status post Right lung pneumonia  Status post septic shock secondary to above  History of staph bacteremia in the past  Hypertension uncontrolled  Decompensated diastolic heart failure  Deconditioning  Insulin requiring diabetes mellitus  Morbid obesity  Acute on  chronic kidney disease stage IV improved  Anemia of chronic disease  Hypoalbuminemia  Plan Change IV Lasix to by mouth Increase ambulation Awaiting skilled nursing facility  LOS: 9 days    Moncerrath Berhe N 03/25/2013, 10:21 AM

## 2013-03-26 LAB — COMPREHENSIVE METABOLIC PANEL
ALT: 8 U/L (ref 0–53)
AST: 16 U/L (ref 0–37)
Albumin: 3.1 g/dL — ABNORMAL LOW (ref 3.5–5.2)
Alkaline Phosphatase: 80 U/L (ref 39–117)
BUN: 29 mg/dL — ABNORMAL HIGH (ref 6–23)
Chloride: 106 mEq/L (ref 96–112)
GFR calc Af Amer: 39 mL/min — ABNORMAL LOW (ref >90)
Glucose, Bld: 191 mg/dL — ABNORMAL HIGH (ref 70–99)
Potassium: 4.3 mEq/L (ref 3.5–5.1)
Sodium: 146 mEq/L — ABNORMAL HIGH (ref 135–145)
Total Bilirubin: 0.2 mg/dL — ABNORMAL LOW (ref 0.3–1.2)
Total Protein: 7 g/dL (ref 6.0–8.3)

## 2013-03-26 LAB — CBC
HCT: 30.1 % — ABNORMAL LOW (ref 39.0–52.0)
MCHC: 31.9 g/dL (ref 30.0–36.0)
MCV: 84.3 fL (ref 78.0–100.0)
Platelets: 292 10*3/uL (ref 150–400)
RBC: 3.57 MIL/uL — ABNORMAL LOW (ref 4.22–5.81)
RDW: 16.4 % — ABNORMAL HIGH (ref 11.5–15.5)
WBC: 6.7 10*3/uL (ref 4.0–10.5)

## 2013-03-26 LAB — GLUCOSE, CAPILLARY
Glucose-Capillary: 161 mg/dL — ABNORMAL HIGH (ref 70–99)
Glucose-Capillary: 223 mg/dL — ABNORMAL HIGH (ref 70–99)

## 2013-03-26 MED ORDER — INSULIN GLARGINE 100 UNIT/ML ~~LOC~~ SOLN
10.0000 [IU] | Freq: Every day | SUBCUTANEOUS | Status: DC
  Administered 2013-03-26: 23:00:00 10 [IU] via SUBCUTANEOUS
  Filled 2013-03-26 (×2): qty 0.1

## 2013-03-26 NOTE — Progress Notes (Signed)
Client received from 2600 on specialty bed with air mattress.  Condom cath in place.  Vital signs and weight obtained by NT.  Respiratory exchange even and unlabored.  Mumbling in sleep, but, responsive to his wife.  No SOB noted on arrival to unit.  1540 Report given to Tai our charge nurse.  She is to complete assessment.

## 2013-03-26 NOTE — Progress Notes (Signed)
Subjective:  Patient denies any chest pain or shortness of breath. Overall complaints of generalized weakness. Did not get out of bed yesterday or today  Objective:  Vital Signs in the last 24 hours: Temp:  [97.8 F (36.6 C)-98.9 F (37.2 C)] 98.9 F (37.2 C) (10/19 0817) Pulse Rate:  [82-101] 92 (10/19 0817) Resp:  [15-23] 23 (10/19 0817) BP: (153-179)/(62-89) 175/80 mmHg (10/19 0817) SpO2:  [94 %-100 %] 96 % (10/19 0817) Weight:  [105.5 kg (232 lb 9.4 oz)] 105.5 kg (232 lb 9.4 oz) (10/19 0300)  Intake/Output from previous day: 10/18 0701 - 10/19 0700 In: 120 [P.O.:120] Out: 500 [Urine:500] Intake/Output from this shift:    Physical Exam: Neck: no adenopathy, no carotid bruit, no JVD and supple, symmetrical, trachea midline Lungs: Decreased breath sound at bases Heart: regular rate and rhythm, S1, S2 normal and Soft systolic murmur noted Abdomen: soft, non-tender; bowel sounds normal; no masses,  no organomegaly Extremities: No clubbing cyanosis 1+ edema noted  Lab Results:  Recent Labs  03/25/13 0415 03/26/13 0358  WBC 6.5 6.7  HGB 9.5* 9.6*  PLT 291 292    Recent Labs  03/25/13 0415 03/26/13 0358  NA 147* 146*  K 3.8 4.3  CL 106 106  CO2 31 28  GLUCOSE 159* 191*  BUN 27* 29*  CREATININE 1.96* 1.93*   No results found for this basename: TROPONINI, CK, MB,  in the last 72 hours Hepatic Function Panel  Recent Labs  03/26/13 0358  PROT 7.0  ALBUMIN 3.1*  AST 16  ALT 8  ALKPHOS 80  BILITOT 0.2*   No results found for this basename: CHOL,  in the last 72 hours No results found for this basename: PROTIME,  in the last 72 hours  Imaging: Imaging results have been reviewed and No results found.  Cardiac Studies:  Assessment/Plan:  Status post Right lung pneumonia  Status post septic shock secondary to above  History of staph bacteremia in the past  Hypertension uncontrolled  Decompensated diastolic heart failure  Deconditioning  Insulin  requiring diabetes mellitus  Morbid obesity  Acute on chronic kidney disease stage IV improved  Anemia of chronic disease  Hypoalbuminemia  Plan Increase ambulation Transfer to telemetry Awaiting skilled nursing facility  LOS: 10 days    [name] N 03/26/2013, 9:25 AM

## 2013-03-27 LAB — GLUCOSE, CAPILLARY
Glucose-Capillary: 137 mg/dL — ABNORMAL HIGH (ref 70–99)
Glucose-Capillary: 178 mg/dL — ABNORMAL HIGH (ref 70–99)
Glucose-Capillary: 158 mg/dL — ABNORMAL HIGH (ref 70–99)

## 2013-03-27 MED ORDER — METOLAZONE 5 MG PO TABS: 5.0000 mg | ORAL_TABLET | Freq: Every day | ORAL | Status: DC

## 2013-03-27 NOTE — Discharge Summary (Signed)
  Prior the discharge summary dictated on 03/27/2013 dictation number is [phone]

## 2013-03-27 NOTE — Progress Notes (Signed)
Pt being dc to [facility], report called, pt left via ambulance, pt stable

## 2013-03-27 NOTE — Progress Notes (Signed)
Physical Therapy Treatment Patient Details Name: Justin Krist Sr. MRN: [id number] DOB: 11-26-1945 Today's Date: 03/27/2013 Time: 1324-4010 PT Time Calculation (min): 38 min  PT Assessment / Plan / Recommendation  History of Present Illness Pt readmitted from brief stay at SNF with PNA after discharge last week with CHF and weakness. 1M with hx of diastolic CHF, CRI, DM II, Htn, hyperlipidemia decompensated CHF, poorly controlled htn.    PT Comments   Pt progressing with mobility and more positive outlook today. Pt reports legs no longer bother him which was greatest limitation at home prior to last admission. Pt encouraged to continue HEP and mobility with nursing.  Follow Up Recommendations  SNF;Supervision/Assistance - 24 hour     Does the patient have the potential to tolerate intense rehabilitation     Barriers to Discharge        Equipment Recommendations       Recommendations for Other Services    Frequency     Progress towards PT Goals Progress towards PT goals: Progressing toward goals  Plan Current plan remains appropriate    Precautions / Restrictions Precautions Precautions: Fall   Pertinent Vitals/Pain No pain VSS   Mobility  Bed Mobility Bed Mobility: Rolling Right;Rolling Left Rolling Right: 4: Min assist;With rail Rolling Left: 4: Min assist;With rail Right Sidelying to Sit: 4: Min assist;HOB elevated;With rails Details for Bed Mobility Assistance: cueing for hand placement and sequence of transfers with rolling due to incontinence of B/B on arrival, total assist for pericare with RN assist and condom cath replaced by NT, assist to elevate trunk from surface Transfers Sit to Stand: 4: Min assist;From chair/3-in-1;From bed Stand to Sit: 4: Min guard Details for Transfer Assistance: Cues for hand placement with assist for anterior translation. Needs assist to control descent.   Ambulation/Gait Ambulation/Gait Assistance: 4: Min guard Ambulation Distance  (Feet): 125 Feet (125'x 2) Assistive device: Rolling walker Ambulation/Gait Assistance Details: cues for posture, position in RW and safety with seated rest grossly 3 min between ambulation trials Gait Pattern: Step-through pattern;Decreased stride length;Trunk flexed Gait velocity: decreased Stairs: No    Exercises General Exercises - Lower Extremity Long Arc Quad: AROM;Both;Seated;10 reps Hip Flexion/Marching: AROM;Both;Seated;10 reps   PT Diagnosis:    PT Problem List:   PT Treatment Interventions:     PT Goals (current goals can now be found in the care plan section)    Visit Information  Last PT Received On: 03/27/13 Assistance Needed: +1 History of Present Illness: Pt readmitted from brief stay at SNF with PNA after discharge last week with CHF and weakness. 1M with hx of diastolic CHF, CRI, DM II, Htn, hyperlipidemia decompensated CHF, poorly controlled htn.     Subjective Data      Cognition  Cognition Arousal/Alertness: Awake/alert Behavior During Therapy: WFL for tasks assessed/performed Overall Cognitive Status: Within Functional Limits for tasks assessed Problem Solving: Slow processing    Balance     End of Session PT - End of Session Equipment Utilized During Treatment: Gait belt Activity Tolerance: Patient limited by fatigue Patient left: in chair;with call bell/phone within reach Nurse Communication: Mobility status   GP     [name] 03/27/2013, 2:37 PM [name], PT [phone]

## 2013-03-27 NOTE — Discharge Summary (Signed)
[name], [name]             ACCOUNT NO.:  [id number]  MEDICAL RECORD NO.:  [id number]  LOCATION:  4E03C                        FACILITY:  [facility]  PHYSICIAN:  Eduardo Osier. Sharyn Lull, M.D. DATE OF BIRTH:  [birth year]  DATE OF ADMISSION:  03/16/2013 DATE OF DISCHARGE:  03/27/2013                              DISCHARGE SUMMARY   ADMITTING DIAGNOSES: 1. Altered mental status. 2. Right lung pneumonia. 3. Diastolic heart failure. 4. Insulin-requiring diabetes mellitus. 5. Chronic kidney disease stage IV. 6. Anemia of chronic disease.  DISCHARGE DIAGNOSES: 1. Status post right lung pneumonia, status post altered mental     status, status post septic shock secondary to above. 2. History of Staph bacteremia in the past. 3. Hypertension. 4. Compensated diastolic heart failure. 5. Deconditioning. 6. Insulin-requiring diabetes mellitus. 7. Morbid obesity. 8. Chronic kidney disease stage IV. 9. Anemia of chronic disease. 10.Hypoalbuminemia.  DISCHARGE HOME MEDICATIONS: 1. Zaroxolyn 5 mg 1 tablet daily. 2. Tylenol 500 mg every 6 hours as needed as before. 3. Aspirin 81 mg daily. 4. Lasix 80 mg daily. 5. Carvedilol 12.5 mg twice daily. 6. Centrum Silver half tablet daily as before. 7. Lantus insulin 10 units daily at night. 8. Muscle Rub 10-15% cream used locally as before. 9. Protonix 40 mg 1 tablet daily. 10.Potassium chloride 20 mEq 1 tablet twice daily. 11.Flomax 0.4 mg 1 capsule daily. 12.The patient has been advised to stop Levaquin, BiDil.  DIET:  Low salt, low cholesterol 1800 calories ADA diet.  The patient has been advised to monitor blood pressure and blood sugar daily. Follow up with me in 2 weeks.  CONDITION AT DISCHARGE:  Stable.  BRIEF HISTORY AND HOSPITAL COURSE:  [name] is a 67 year old male with past medical history significant for multiple medical problems, i.e., hypertension, diabetes mellitus, history of diastolic heart failure, chronic kidney disease  stage IV, anemia of chronic disease, was admitted by Dr. Algie [name].  He is resident of [facility], was admitted by Dr. Algie [name], was brought to the ER by EMS as the patient was noted to be hypotensive with altered mental status.  The patient was recently discharged with similar presentation with septic shock and CHF.  All cultures were negative.  The patient was empirically treated with vancomycin and Zosyn and subsequently switched to Levaquin as the patient had staph bacteremia in the past.  The patient was found unresponsive by family and was brought to the ED.  Blood pressure in the nursing home was 79/38, blood sugar was 140.  The patient was unresponsive, unable to give any history.  Chest x-ray done in the ER showed new right lung infiltrate.  PHYSICAL EXAMINATION:  GENERAL:  He responds to verbal commands by opening the eyes. VITAL SIGNS:  Temperature was 98.8, blood pressure was 82/45, pulse was 82.  HEENT:  Conjunctivae was pink.  Pupils were equal, round, reactive NECK:  Supple.  No JVD. LUNGS:  Bilateral rhonchi. CARDIOVASCULAR:  S1, S2 was normal.  There was soft systolic murmur.  S4 and S3 gallop noted. ABDOMEN:  Soft, mildly distended.  No tenderness.  No guarding. EXTREMITIES:  No clubbing, cyanosis, 1+ edema in left leg. NEURO:  Edema 1+.  LABORATORY DATA:  Sodium  was 145, potassium 5.7, BUN 39, creatinine 3.74.  His BNP was 29528.  Lactic acid level was high at 3.01. Hemoglobin was 9.5, hematocrit 29.5, white count of 10.8 with left shift.  Last labs, yesterday sodium was 146, potassium 4.3, BUN 29, creatinine has come down to 1.93.  His glucose was 191.  Albumin remains low 3.1.  Hemoglobin is stable 9.6, hematocrit 30.1, white count has come down to 6.7.  BRIEF HOSPITAL COURSE:  The patient was admitted to step-down unit.  The patient was started on IV vanc and Zosyn and p.o. Lasix was switched to IV Lasix.  Pan cultures were obtained which were negative.   Critical care team consultation was obtained during the hospital stay.  His antibiotics were discontinued after 6 days of vanc and Zosyn.  The patient remained afebrile during the hospital stay.  IV Lasix was switched to p.o. and Zaroxolyn was added with good diuresis.  His renal function has remained stable.  OT/PT consultation was obtained.  The patient is ambulating with assistance and will be discharged back to [facility].  The patient did not had any beds available and family requested for private room, so there was delay in the discharge.  The patient will be discharged to [facility] today and will be followed up in my office in 2 weeks.     Eduardo Osier. Sharyn Lull, M.D.     [facility]  D:  03/27/2013  T:  03/27/2013  Job:  [id number]

## 2013-03-28 ENCOUNTER — Non-Acute Institutional Stay (SKILLED_NURSING_FACILITY): Payer: Medicare Other | Admitting: Internal Medicine

## 2013-03-28 DIAGNOSIS — I5030 Unspecified diastolic (congestive) heart failure: Secondary | ICD-10-CM

## 2013-03-28 DIAGNOSIS — E1159 Type 2 diabetes mellitus with other circulatory complications: Secondary | ICD-10-CM

## 2013-03-28 DIAGNOSIS — E870 Hyperosmolality and hypernatremia: Secondary | ICD-10-CM

## 2013-03-28 DIAGNOSIS — I509 Heart failure, unspecified: Secondary | ICD-10-CM

## 2013-03-28 DIAGNOSIS — R579 Shock, unspecified: Secondary | ICD-10-CM

## 2013-03-28 DIAGNOSIS — E876 Hypokalemia: Secondary | ICD-10-CM

## 2013-03-28 DIAGNOSIS — R5381 Other malaise: Secondary | ICD-10-CM

## 2013-03-28 NOTE — Progress Notes (Signed)
Patient ID: Justin Yom Sr., male   DOB: [birth year], 67 y.o.   MRN: [id number] Provider:  Gwenith Spitz. Renato Gails, D.O., C.M.D. Location:  [facility]   PCP: Robynn Pane, MD  Code Status: full code  Allergies  Allergen Reactions  . Ancef [Cefazolin] Itching and Rash    rash  . Sulfa Antibiotics Rash    Chief Complaint  Patient presents with  . Hospitalization Follow-up    new admission s/p hospitalization with unresponsiveness, hypotension found to be due to right-sided pneumonia with sepsis (staph bacteremia), diastolic CHF  . Altered Mental Status    was treated with vanc, zosyn, IV lasix and transitioned to po zaroxolyn and levaquin with improvement    HPI: 67 y.o. male with h/o diastolic chf, DMII on insulin, CKDIV, anemia of chronic disease was admitted to the hospital for unresponsiveness and hypotension.  He was found to have septic shock secondary to staph.  He was treated with vanc and zosyn and lasix was added for volume overload.  He improved and was changed to po levaquin and zaroxolyn.  He is on daily weights with fluid restrictions.    When seen, he was irritable and did not want to be bothered.  ROS: Review of Systems  Constitutional: Positive for malaise/fatigue. Negative for fever.  HENT: Negative for congestion.   Respiratory: Positive for shortness of breath. Negative for cough.   Cardiovascular: Negative for chest pain and leg swelling.  Gastrointestinal: Negative for constipation.  Genitourinary: Negative for dysuria.  Musculoskeletal: Negative for falls.  Skin: Negative for rash.  Neurological: Negative for dizziness.  Endo/Heme/Allergies: Bruises/bleeds easily.  Psychiatric/Behavioral: Positive for depression.     Past Medical History  Diagnosis Date  . Diabetes mellitus   . Hypertension   . Hyperlipidemia   . CHF (congestive heart failure)   . Enlarged heart   . Pneumonia     hx of    Past Surgical History  Procedure  Laterality Date  . Right leg fracture    . Skin graft    . Knee surgery    . Tee without cardioversion  03/17/2012    Procedure: TRANSESOPHAGEAL ECHOCARDIOGRAM (TEE);  Surgeon: Ricki Rodriguez, MD;  Location: [facility] ENDOSCOPY;  Service: Cardiovascular;  Laterality: N/A;   Social History:   reports that he quit smoking about 29 years ago. He has never used smokeless tobacco. He reports that he does not drink alcohol or use illicit drugs.  No family history on file.  Medications: Patient's Medications  New Prescriptions   No medications on file  Previous Medications   ACETAMINOPHEN (TYLENOL) 500 MG TABLET    Take 500 mg by mouth every 6 (six) hours as needed for pain.   ASPIRIN 81 MG TABLET    Take 81 mg by mouth daily.   CARVEDILOL (COREG) 12.5 MG TABLET    Take 1 tablet (12.5 mg total) by mouth 2 (two) times daily with a meal.   FUROSEMIDE (LASIX) 80 MG TABLET    Take 80 mg by mouth daily.   INSULIN ASPART (NOVOLOG) 100 UNIT/ML INJECTION    Inject 5 Units into the skin 3 (three) times daily before meals. For cbg >200   MENTHOL-METHYL SALICYLATE (MUSCLE RUB) 10-15 % CREA    Apply 1 application topically as needed (neck and shoulder pain).   METOLAZONE (ZAROXOLYN) 5 MG TABLET    Take 1 tablet (5 mg total) by mouth daily.   MULTIPLE VITAMINS-MINERALS (CENTRUM SILVER ADULT 50+ PO)  Take 0.5 tablets by mouth daily.   PANTOPRAZOLE (PROTONIX) 40 MG TABLET    Take 1 tablet (40 mg total) by mouth daily.   TAMSULOSIN HCL (FLOMAX) 0.4 MG CAPS    Take 1 capsule (0.4 mg total) by mouth daily.   TRIAMCINOLONE CREAM (KENALOG) 0.1 %    Apply 1 application topically 3 (three) times daily.  Modified Medications   No medications on file  Discontinued Medications   INSULIN GLARGINE (LANTUS) 100 UNIT/ML INJECTION    Inject 10 Units into the skin at bedtime.   POTASSIUM CHLORIDE SA (K-DUR,KLOR-CON) 20 MEQ TABLET    Take 1 tablet (20 mEq total) by mouth 2 (two) times daily.     Physical Exam: There were  no vitals filed for this visit. Physical Exam  Constitutional: No distress.  chronically ill appearing male  HENT:  Head: Normocephalic and atraumatic.  Right Ear: External ear normal.  Left Ear: External ear normal.  Nose: Nose normal.  Mouth/Throat: Oropharynx is clear and moist. No oropharyngeal exudate.  Eyes: Conjunctivae and EOM are normal. Pupils are equal, round, and reactive to light.  Neck: Neck supple.  Cardiovascular: Normal rate, regular rhythm, normal heart sounds and intact distal pulses.   Pulmonary/Chest: Effort normal and breath sounds normal. No respiratory distress. He has no wheezes. He has no rales.  Abdominal: Soft. Bowel sounds are normal. He exhibits no distension. There is no tenderness.  Musculoskeletal: Normal range of motion. He exhibits no edema and no tenderness.  Neurological: He is alert.  Skin: Skin is warm and dry.     Labs reviewed: Basic Metabolic Panel:  Recent Labs  96/04/54 0750  06/03/12 2237  03/06/13 2300  03/24/13 0415 03/25/13 0415 03/26/13 0358  NA 143  < > 153*  < > 146*  < > 148* 147* 146*  K 3.7  < > 4.0  < > 3.0*  < > 3.9 3.8 4.3  CL 105  < > 113*  < > 110  < > 110 106 106  CO2 29  < > 30  < > 28  < > 27 31 28   GLUCOSE 210*  < > 205*  < > 179*  < > 183* 159* 191*  BUN 24*  < > 36*  < > 14  < > 27* 27* 29*  CREATININE 1.87*  < > 2.20*  < > 1.43*  < > 2.00* 1.96* 1.93*  CALCIUM 8.4  < > 9.1  < > 8.2*  < > 8.3* 9.0 8.9  MG 2.1  --  2.2  --  2.0  --   --   --   --   < > = values in this interval not displayed. Liver Function Tests:  Recent Labs  03/24/13 0415 03/25/13 0415 03/26/13 0358  AST 13 12 16   ALT 7 7 8   ALKPHOS 76 73 80  BILITOT 0.2* 0.3 0.2*  PROT 6.3 7.3 7.0  ALBUMIN 2.8* 3.4* 3.1*    Recent Labs  06/03/12 1024 03/06/13 1800  LIPASE 9* 8*  CBC:  Recent Labs  01/24/13 1353 03/06/13 2300  03/16/13 2112  03/24/13 0415 03/25/13 0415 03/26/13 0358  WBC 10.0 7.0  < > 10.8*  < > 6.0 6.5 6.7   NEUTROABS 8.0* 4.9  --  9.5*  --   --   --   --   HGB 12.7* 10.7*  < > 9.5*  < > 8.7* 9.5* 9.6*  HCT 38.0* 31.2*  < > 29.5*  < >  26.7* 30.2* 30.1*  MCV 82.6 80.6  < > 85.0  < > 84.5 84.8 84.3  PLT 227 187  < > 229  < > 251 291 292  < > = values in this interval not displayed. Cardiac Enzymes:  Recent Labs  03/08/13 1619 03/08/13 2210 03/09/13 0908  TROPONINI 8.54* <0.30 <0.30  CBG:  Recent Labs  03/27/13 0607 03/27/13 1207 03/27/13 1638  GLUCAP 137* 178* 158*   Imaging and Procedures: 03/16/13:  CT head:  1. No acute intracranial abnormality  2. Interval development of inflammatory paranasal sinus disease,  possibly acute sinusitis predominantly affecting the right maxillary  sinus and ethmoid air cells.  3. Stable mild atrophy and chronic microvascular ischemic white  matter changes.  03/16/13:  CXR:  New consolidation of the right upper lung lobe, most compatible  with pneumonia. Small right pleural effusion again noted. An  underlying obstructing lesion cannot be entirely excluded; would  recommend follow-up CT of the chest after completion of treatment  for pneumonia.  03/22/13:  abd xray:  No acute abnormality noted.  Assessment/Plan 1. Diastolic CHF -has stabilized -monitor volume status, weights, fluid restriction  2. DIABETES MELLITUS, WITH VASCULAR COMPLICATIONS -check hba1c, bmp -cont cbg monitoring and avoid hypoglycemia  3. Physical deconditioning -here for therapy after his hospitalization with shock and diastolic chf  4. Shock Due to staph pneumonia infection Has resolved F/u cbc, bmp  5.  Hyponatremia -f/u bmp, monitor volume status  6.  Hypokalemia -f/u bmp, cont diuresis and monitor K levels  Functional status:  Dependent in bathing, dressing at this point due to deconditioning  Family/ staff Communication: unit supervisor present during visit  Labs/tests ordered:  Cbc, bmp, hba1c next draw

## 2013-04-11 ENCOUNTER — Encounter: Payer: Self-pay | Admitting: Internal Medicine

## 2013-04-11 ENCOUNTER — Non-Acute Institutional Stay (SKILLED_NURSING_FACILITY): Payer: Medicare Other | Admitting: Internal Medicine

## 2013-04-11 DIAGNOSIS — E1159 Type 2 diabetes mellitus with other circulatory complications: Secondary | ICD-10-CM

## 2013-04-11 MED ORDER — INSULIN GLARGINE 100 UNIT/ML ~~LOC~~ SOLN: 14.0000 [IU] | Freq: Every day | SUBCUTANEOUS | Status: DC

## 2013-04-11 NOTE — Progress Notes (Signed)
Patient ID: Justin Weddington Sr., male   DOB: 1946/05/08, 67 y.o.   MRN: [id number] Location:  [facility] SN[facility] Provider:  Gwenith Spitz. Renato Gails, D.O., C.M.D.  Chief Complaint  Patient presents with  . Acute Visit    hyperglycemia   HPI:  67 yo male here for short term rehab s/p hospitalization for pneumonia with staph bacteremia and diastolic chf was seen for acute visit due to hyperglycemia on hs CBGs.  He continues to be irritable.  He has been out to see Dr. Sharyn Lull, his cardiologist.    Review of Systems:  Review of Systems  Constitutional: Positive for malaise/fatigue. Negative for fever and chills.  HENT: Negative for congestion.   Respiratory: Positive for shortness of breath.   Cardiovascular: Negative for chest pain.  Gastrointestinal: Negative for abdominal pain.  Genitourinary: Negative for dysuria.  Musculoskeletal: Negative for falls.  Neurological: Positive for weakness. Negative for loss of consciousness.  Endo/Heme/Allergies: Bruises/bleeds easily.  Psychiatric/Behavioral:       Waxing and waning cognition    Medications: Patient's Medications  New Prescriptions   No medications on file  Previous Medications   ACETAMINOPHEN (TYLENOL) 500 MG TABLET    Take 500 mg by mouth every 6 (six) hours as needed for pain.   ASPIRIN 81 MG TABLET    Take 81 mg by mouth daily.   CARVEDILOL (COREG) 12.5 MG TABLET    Take 1 tablet (12.5 mg total) by mouth 2 (two) times daily with a meal.   FUROSEMIDE (LASIX) 80 MG TABLET    Take 80 mg by mouth daily.   MENTHOL-METHYL SALICYLATE (MUSCLE RUB) 10-15 % CREA    Apply 1 application topically as needed (neck and shoulder pain).   METOLAZONE (ZAROXOLYN) 5 MG TABLET    Take 1 tablet (5 mg total) by mouth daily.   MULTIPLE VITAMINS-MINERALS (CENTRUM SILVER ADULT 50+ PO)    Take 0.5 tablets by mouth daily.   PANTOPRAZOLE (PROTONIX) 40 MG TABLET    Take 1 tablet (40 mg total) by mouth daily.   POTASSIUM CHLORIDE SA (K-DUR,KLOR-CON) 20  MEQ TABLET    Take 1 tablet (20 mEq total) by mouth 2 (two) times daily.   TAMSULOSIN HCL (FLOMAX) 0.4 MG CAPS    Take 1 capsule (0.4 mg total) by mouth daily.   TRIAMCINOLONE CREAM (KENALOG) 0.1 %    Apply 1 application topically 3 (three) times daily.  Modified Medications   Modified Medication Previous Medication   INSULIN GLARGINE (LANTUS) 100 UNIT/ML INJECTION insulin glargine (LANTUS) 100 UNIT/ML injection      Inject 0.14 mLs (14 Units total) into the skin at bedtime.    Inject 10 Units into the skin at bedtime.  Discontinued Medications   No medications on file    Physical Exam: Filed Vitals:   04/11/13 1008  BP: 110/86  Pulse: 102  Temp: 98.1 F (36.7 C)  Resp: 20  Height: 5\' 11"  (1.803 m)  Weight: 212 lb (96.163 kg)  SpO2: 97%   Physical Exam  Constitutional:  Chronically ill appearing male seated in his wheelchair  HENT:  Head: Normocephalic and atraumatic.  Cardiovascular: Normal rate, regular rhythm and normal heart sounds.   Pulmonary/Chest: Effort normal and breath sounds normal. No respiratory distress.  Abdominal: Soft. Bowel sounds are normal.  Neurological: He is alert.  Skin: Skin is warm and dry.     Labs reviewed: Basic Metabolic Panel:  Recent Labs  40/98/11 0750  06/03/12 2237  03/06/13 2300  03/24/13  1610 03/25/13 0415 03/26/13 0358  NA 143  < > 153*  < > 146*  < > 148* 147* 146*  K 3.7  < > 4.0  < > 3.0*  < > 3.9 3.8 4.3  CL 105  < > 113*  < > 110  < > 110 106 106  CO2 29  < > 30  < > 28  < > 27 31 28   GLUCOSE 210*  < > 205*  < > 179*  < > 183* 159* 191*  BUN 24*  < > 36*  < > 14  < > 27* 27* 29*  CREATININE 1.87*  < > 2.20*  < > 1.43*  < > 2.00* 1.96* 1.93*  CALCIUM 8.4  < > 9.1  < > 8.2*  < > 8.3* 9.0 8.9  MG 2.1  --  2.2  --  2.0  --   --   --   --   < > = values in this interval not displayed.  Liver Function Tests:  Recent Labs  03/24/13 0415 03/25/13 0415 03/26/13 0358  AST 13 12 16   ALT 7 7 8   ALKPHOS 76 73 80   BILITOT 0.2* 0.3 0.2*  PROT 6.3 7.3 7.0  ALBUMIN 2.8* 3.4* 3.1*    CBC:  Recent Labs  01/24/13 1353 03/06/13 2300  03/16/13 2112  03/24/13 0415 03/25/13 0415 03/26/13 0358  WBC 10.0 7.0  < > 10.8*  < > 6.0 6.5 6.7  NEUTROABS 8.0* 4.9  --  9.5*  --   --   --   --   HGB 12.7* 10.7*  < > 9.5*  < > 8.7* 9.5* 9.6*  HCT 38.0* 31.2*  < > 29.5*  < > 26.7* 30.2* 30.1*  MCV 82.6 80.6  < > 85.0  < > 84.5 84.8 84.3  PLT 227 187  < > 229  < > 251 291 292  < > = values in this interval not displayed. CBGs reviewed:  Range 126-263 at night 03/29/13:  hba1c 6.2  Assessment/Plan 1. DIABETES MELLITUS, WITH VASCULAR COMPLICATIONS Increase lantus to 14 units from 10 units qhs and check cbgs in the am and hs x 1 wk I will review these results when they are provided to me at that time  Labs/tests ordered: increase cbgs

## 2013-04-21 ENCOUNTER — Encounter: Payer: Self-pay | Admitting: Internal Medicine

## 2013-04-21 ENCOUNTER — Non-Acute Institutional Stay (SKILLED_NURSING_FACILITY): Payer: Medicare Other | Admitting: Internal Medicine

## 2013-04-21 DIAGNOSIS — E1159 Type 2 diabetes mellitus with other circulatory complications: Secondary | ICD-10-CM

## 2013-04-21 NOTE — Progress Notes (Signed)
Patient ID: Justin Blatz Sr., male   DOB: 1946-02-11, 67 y.o.   MRN: [id number]  [name] living [place]  Allergies  Allergen Reactions  . Ancef [Cefazolin] Itching and Rash    rash  . Sulfa Antibiotics Rash   Chief Complaint  Patient presents with  . Acute Visit    hyperglycemia    HPI:   67 yo male patient with history of dm type 2, chf is here for short term rehab s/p hospitalization for pneumonia and bacteremia. He is seen today with his cbg running high. He eats food from the facility. Denies any hypoglycemic episodes. Denies any palpitation, diaphoresis or confusion either. Denies polyuria or polydypsia  Review of Systems: Constitutional: Negative for fever and chills.  HENT: Negative for congestion.   Respiratory: Positive for shortness of breath.   Cardiovascular: Negative for chest pain.  Gastrointestinal: Negative for abdominal pain.  Genitourinary: Negative for dysuria.  Musculoskeletal: Negative for falls.  Neurological: Positive for weakness. Negative for loss of consciousness.  Endo/Heme/Allergies: Bruises/bleeds easily.  Psychiatric/Behavioral:       Waxing and waning cognition   Past Medical History  Diagnosis Date  . Diabetes mellitus   . Hypertension   . Hyperlipidemia   . CHF (congestive heart failure)   . Enlarged heart   . Pneumonia     hx of    Current Outpatient Prescriptions on File Prior to Visit  Medication Sig Dispense Refill  . acetaminophen (TYLENOL) 500 MG tablet Take 500 mg by mouth every 6 (six) hours as needed for pain.      Marland Kitchen aspirin 81 MG tablet Take 81 mg by mouth daily.      . carvedilol (COREG) 12.5 MG tablet Take 1 tablet (12.5 mg total) by mouth 2 (two) times daily with a meal.  60 tablet  3  . furosemide (LASIX) 80 MG tablet Take 80 mg by mouth daily.      . insulin glargine (LANTUS) 100 UNIT/ML injection Inject 0.14 mLs (14 Units total) into the skin at bedtime.  10 mL  3  . Menthol-Methyl Salicylate (MUSCLE RUB) 10-15 % CREA  Apply 1 application topically as needed (neck and shoulder pain).      Marland Kitchen metolazone (ZAROXOLYN) 5 MG tablet Take 1 tablet (5 mg total) by mouth daily.  30 tablet  3  . Multiple Vitamins-Minerals (CENTRUM SILVER ADULT 50+ PO) Take 0.5 tablets by mouth daily.      . pantoprazole (PROTONIX) 40 MG tablet Take 1 tablet (40 mg total) by mouth daily.  30 tablet  3  . potassium chloride SA (K-DUR,KLOR-CON) 20 MEQ tablet Take 1 tablet (20 mEq total) by mouth 2 (two) times daily.  60 tablet  3  . Tamsulosin HCl (FLOMAX) 0.4 MG CAPS Take 1 capsule (0.4 mg total) by mouth daily.  30 capsule  3  . triamcinolone cream (KENALOG) 0.1 % Apply 1 application topically 3 (three) times daily.       No current facility-administered medications on file prior to visit.     Physical Exam   BP 118/65  Pulse 76  Temp(Src) 98.2 F (36.8 C)  Resp 18  Ht 5\' 11"  (1.803 m)  Wt 209 lb (94.802 kg)  BMI 29.16 kg/m2  SpO2 97%  Constitutional:  Chronically ill appearing male lying on his bed HENT:   Head: Normocephalic and atraumatic.  Cardiovascular: Normal rate, regular rhythm and normal heart sounds.   Pulmonary/Chest: Effort normal and breath sounds normal. No respiratory distress.  Abdominal:  Soft. Bowel sounds are normal.  Neurological: He is alert.  Skin: Skin is warm and dry.   Labs- Lab Results  Component Value Date   HGBA1C 6.2* 03/06/2013   04/12/13 6.4  Assessment/plan  Dm type 2 with vascular complications- reviewed last a1c of 6.2 in 03/06/13 and 6.4 on 04/12/13 s/o controlled DM. Reviewed facility cbg ranging between 119- 220 mostly with few of 243 and 238 as well. Given his controlled a1c, will d/c lantus for now and have him on premeal coverage only for cbg > 200 and reassess. Will have strict fasting in am and premeal sugar checked

## 2013-04-24 ENCOUNTER — Encounter: Payer: Self-pay | Admitting: Adult Health

## 2013-04-24 ENCOUNTER — Non-Acute Institutional Stay (SKILLED_NURSING_FACILITY): Payer: Medicare Other | Admitting: Adult Health

## 2013-04-24 DIAGNOSIS — L408 Other psoriasis: Secondary | ICD-10-CM

## 2013-04-24 DIAGNOSIS — L409 Psoriasis, unspecified: Secondary | ICD-10-CM

## 2013-04-24 DIAGNOSIS — B372 Candidiasis of skin and nail: Secondary | ICD-10-CM

## 2013-04-24 NOTE — Progress Notes (Signed)
Patient ID: Justin Tuman Sr., male   DOB: [birth year], 67 y.o.   MRN: [id number]  [facility]  Allergies  Allergen Reactions  . Ancef [Cefazolin] Itching and Rash    rash  . Sulfa Antibiotics Rash     Chief Complaint  Patient presents with  . Acute Visit    skin issues     HPI:  He is being seen today for his rashes. He has several different rashes present. The first is on his upper extremities and is raised plaque psoriasis appearing rash. On his buttocks; perineal area; and posterior upper thighs and is fungal in nature. He has severe appearing dry skin on his lower legs bilaterally with flaking present. He is scratching and has caused bleeding to his buttocks.    Past Medical History  Diagnosis Date  . Diabetes mellitus   . Hypertension   . Hyperlipidemia   . CHF (congestive heart failure)   . Enlarged heart   . Pneumonia     hx of     Past Surgical History  Procedure Laterality Date  . Right leg fracture    . Skin graft    . Knee surgery    . Tee without cardioversion  03/17/2012    Procedure: TRANSESOPHAGEAL ECHOCARDIOGRAM (TEE);  Surgeon: Ricki Rodriguez, MD;  Location: [facility] ENDOSCOPY;  Service: Cardiovascular;  Laterality: N/A;    VITAL SIGNS BP 118/65  Pulse 76  Ht 5\' 11"  (1.803 m)  Wt 209 lb (94.802 kg)  BMI 29.16 kg/m2   Patient's Medications  New Prescriptions   No medications on file  Previous Medications   ACETAMINOPHEN (TYLENOL) 500 MG TABLET    Take 500 mg by mouth every 6 (six) hours as needed for pain.   ASPIRIN 81 MG TABLET    Take 81 mg by mouth daily.   CARVEDILOL (COREG) 12.5 MG TABLET    Take 1 tablet (12.5 mg total) by mouth 2 (two) times daily with a meal.   FUROSEMIDE (LASIX) 80 MG TABLET    Take 80 mg by mouth daily.   INSULIN ASPART (NOVOLOG) 100 UNIT/ML INJECTION    Inject 5 Units into the skin 3 (three) times daily before meals. For cbg >200   MENTHOL-METHYL SALICYLATE (MUSCLE RUB) 10-15 % CREA    Apply 1 application topically as  needed (neck and shoulder pain).   METOLAZONE (ZAROXOLYN) 5 MG TABLET    Take 1 tablet (5 mg total) by mouth daily.   MULTIPLE VITAMINS-MINERALS (CENTRUM SILVER ADULT 50+ PO)    Take 0.5 tablets by mouth daily.   PANTOPRAZOLE (PROTONIX) 40 MG TABLET    Take 1 tablet (40 mg total) by mouth daily.   TAMSULOSIN HCL (FLOMAX) 0.4 MG CAPS    Take 1 capsule (0.4 mg total) by mouth daily.   TRIAMCINOLONE CREAM (KENALOG) 0.1 %    Apply 1 application topically 3 (three) times daily.  Modified Medications   No medications on file  Discontinued Medications   INSULIN GLARGINE (LANTUS) 100 UNIT/ML INJECTION    Inject 0.14 mLs (14 Units total) into the skin at bedtime.   POTASSIUM CHLORIDE SA (K-DUR,KLOR-CON) 20 MEQ TABLET    Take 1 tablet (20 mEq total) by mouth 2 (two) times daily.    SIGNIFICANT DIAGNOSTIC EXAMS   LABS REVIEWED:   03-06-13: hgb a1c 6.2 03-24-13: BNP 20,835.0 03-26-13: wbc 6.7; hgb 9.6; hct 30.1; mcv 84.3; plt 292; glucose 191; bun 29; creat 1.93; k+4.3; na++146; liver normal albumin 3.1  Review of Systems  Constitutional: Negative for malaise/fatigue.  Respiratory: Negative for cough.   Cardiovascular: Negative for chest pain.  Gastrointestinal: Negative for abdominal pain.  Musculoskeletal: Negative for myalgias.  Skin: Positive for itching and rash.  Neurological: Negative for headaches.  Psychiatric/Behavioral: Negative for depression.      Physical Exam  Constitutional: He appears well-developed and well-nourished. No distress.  overweight  Neck: Neck supple. No JVD present.  Cardiovascular: Normal rate, regular rhythm and intact distal pulses.   Respiratory: Effort normal and breath sounds normal. No respiratory distress. He has no wheezes.  GI: Soft. Bowel sounds are normal. He exhibits no distension. There is no tenderness.  Musculoskeletal: He exhibits no edema.  Right hemiparesis present able to move left extremities   Neurological: He is alert.  Skin:  Skin is warm and dry. He is not diaphoretic.   He has several different rashes present. The first is on his upper extremities and is raised plaque psoriasis appearing rash. On his buttocks; perineal area; and posterior upper thighs and is fungal in nature. He has severe appearing dry skin on his lower legs bilaterally with flaking present. He is scratching and has caused bleeding to his buttocks.      ASSESSMENT PLAN  For his fungal rash will being ketoconazole 2% cream twice daily for 4 weeks; will also begin 100 mg daily for 7 days; will begin benadryl 25 mg every 6 hours as needed for itching   For his upper extremity psoriasis rash will begin hydrocortisone 1% topical three times daily   For his lower extremities will continue his triamcinolone oint to remove the dry flaky skin.   will refer him to a dermatologist for further evaluation and treatment options  I will reevaluate him on Wed.

## 2013-05-01 ENCOUNTER — Encounter: Payer: Self-pay | Admitting: Internal Medicine

## 2013-05-02 ENCOUNTER — Non-Acute Institutional Stay (SKILLED_NURSING_FACILITY): Payer: Medicare Other | Admitting: Internal Medicine

## 2013-05-02 DIAGNOSIS — L97409 Non-pressure chronic ulcer of unspecified heel and midfoot with unspecified severity: Secondary | ICD-10-CM

## 2013-05-02 DIAGNOSIS — L97429 Non-pressure chronic ulcer of left heel and midfoot with unspecified severity: Secondary | ICD-10-CM

## 2013-05-02 DIAGNOSIS — E1159 Type 2 diabetes mellitus with other circulatory complications: Secondary | ICD-10-CM

## 2013-05-02 DIAGNOSIS — L408 Other psoriasis: Secondary | ICD-10-CM

## 2013-05-02 DIAGNOSIS — B372 Candidiasis of skin and nail: Secondary | ICD-10-CM

## 2013-05-02 DIAGNOSIS — I5030 Unspecified diastolic (congestive) heart failure: Secondary | ICD-10-CM

## 2013-05-02 DIAGNOSIS — L409 Psoriasis, unspecified: Secondary | ICD-10-CM

## 2013-05-02 DIAGNOSIS — R5381 Other malaise: Secondary | ICD-10-CM

## 2013-05-02 DIAGNOSIS — I509 Heart failure, unspecified: Secondary | ICD-10-CM

## 2013-05-02 NOTE — Progress Notes (Signed)
Patient ID: Justin Achord Sr., male   DOB: 1945-12-22, 67 y.o.   MRN: [id number]  [name] [place] [facility] L. Renato Gails, D.O., C.M.D.  PCP: [name], MD  Code Status: full code  Allergies  Allergen Reactions  . Ancef [Cefazolin] Itching and Rash  . Sulfa Antibiotics Itching and Rash    Chief Complaint  Patient presents with  . Discharge Note    d/c home with home health RN, PT, wound care for left heel wound--allevyn and wrap with kerlix daily    HPI:  67 yo male here for short term rehab since 10/21  s/p hospitalization for unresponsive episode.  He was found to have right-sided pneumonia with sepsis and was treated with vanc, zosyn. He then completed his course of levaquin therapy.   He developed volume overload and required IV lasix that was then transitioned to po zaroxolyn. Here he was on a fluid restriction and daily weights.  I saw him again 11/4 due to hyperglycemia and increased his lantus to 14 units form 10 units and orders cbgs ac breakfast and hs for a week.  He was seen by Dr. Glade Lloyd 11/14, and she stopped the lantus and put him on novolog alone for cbg >200.  He was seen by NP Green on 11/17 for 2 different rashes and begun on treatments for these (fungal rash with ketoconazole for 4 wks with oral treatment for a week, UE psoriasis with hydrocortisone 1% tid and LE rash with triamcinolone and order written for outpatient derm f/u).    Review of Systems:  Review of Systems  Constitutional: Negative for fever.  HENT: Negative for congestion.   Eyes: Negative for blurred vision.  Respiratory: Negative for shortness of breath.   Cardiovascular: Positive for leg swelling. Negative for chest pain.  Gastrointestinal: Negative for abdominal pain.  Genitourinary: Negative for dysuria.  Musculoskeletal:       Heel pain  Skin: Positive for itching and rash.  Neurological: Positive for weakness. Negative for dizziness.  Endo/Heme/Allergies: Bruises/bleeds easily.    Psychiatric/Behavioral: Positive for depression.     Past Medical History  Diagnosis Date  . Diabetes mellitus   . Hypertension   . Hyperlipidemia   . CHF (congestive heart failure)   . Enlarged heart   . Pneumonia     hx of   . Osteomyelitis of foot     LT  . CKD (chronic kidney disease), stage IV     Past Surgical History  Procedure Laterality Date  . Right leg fracture    . Skin graft    . Knee surgery    . Tee without cardioversion  03/17/2012    Procedure: TRANSESOPHAGEAL ECHOCARDIOGRAM (TEE);  Surgeon: Ricki Rodriguez, MD;  Location: [facility] ENDOSCOPY;  Service: Cardiovascular;  Laterality: N/A;  . Incision and drainage Left 07/02/2013    Procedure: INCISION AND DRAINAGE LEFT FOOT;  Surgeon: Kathryne Hitch, MD;  Location: [facility] ORS;  Service: Orthopedics;  Laterality: Left;  . Amputation Left 07/18/2013    Procedure: AMPUTATION BELOW KNEE;  Surgeon: Kathryne Hitch, MD;  Location: [facility] OR;  Service: Orthopedics;  Laterality: Left;    Social History:   reports that he quit smoking about 29 years ago. He has never used smokeless tobacco. He reports that he does not drink alcohol or use illicit drugs.  No family history on file.  Medications: Patient's Medications  New Prescriptions   FEEDING SUPPLEMENT, GLUCERNA SHAKE, (GLUCERNA SHAKE) LIQD    Take 237 mLs by  mouth 3 (three) times daily between meals.   ONDANSETRON (ZOFRAN) 4 MG TABLET    Take 1 tablet (4 mg total) by mouth every 6 (six) hours as needed for nausea.  Previous Medications   AMINO ACIDS-PROTEIN HYDROLYS (FEEDING SUPPLEMENT, PRO-STAT SUGAR FREE 64,) LIQD    Take 30 mLs by mouth daily with breakfast.   CARVEDILOL (COREG) 12.5 MG TABLET    Take 1 tablet (12.5 mg total) by mouth 2 (two) times daily with a meal.   MENTHOL-METHYL SALICYLATE (MUSCLE RUB) 10-15 % CREA    Apply 1 application topically as needed (neck and shoulder pain).   MULTIPLE VITAMINS-MINERALS (CENTRUM SILVER ADULT 50+ PO)    Take 0.5  tablets by mouth daily.   OMEPRAZOLE (PRILOSEC) 20 MG CAPSULE    Take 20 mg by mouth daily.   TAMSULOSIN HCL (FLOMAX) 0.4 MG CAPS    Take 1 capsule (0.4 mg total) by mouth daily.   TRIAMCINOLONE CREAM (KENALOG) 0.1 %    Apply 1 application topically 3 (three) times daily. On back and bottom  Modified Medications   Modified Medication Previous Medication   HYDROCODONE-ACETAMINOPHEN (NORCO/VICODIN) 5-325 MG PER TABLET HYDROcodone-acetaminophen (NORCO/VICODIN) 5-325 MG per tablet      Take one tablet by mouth every four hours as needed for moderate pain    Take 1 tablet by mouth every 4 (four) hours as needed for moderate pain.   INSULIN ASPART (NOVOLOG) 100 UNIT/ML INJECTION insulin aspart (NOVOLOG) 100 UNIT/ML injection      Inject into the skin 3 (three) times daily with meals. SSI 200-300   7 UNITS; 301-400  9 UNITS; 401-500   12  UNITS; > 500 14  UNITS AND CALL MD   CBG AC AND QHS    Inject 4 Units into the skin 3 (three) times daily with meals.  Discontinued Medications   ACETAMINOPHEN (TYLENOL) 500 MG TABLET    Take 500 mg by mouth every 6 (six) hours as needed for pain.   ASPIRIN 81 MG TABLET    Take 81 mg by mouth daily.   FUROSEMIDE (LASIX) 80 MG TABLET    Take 80 mg by mouth every morning.    INSULIN ASPART (NOVOLOG) 100 UNIT/ML INJECTION    Inject 5-22 Units into the skin 3 (three) times daily before meals. On sliding scale   METOLAZONE (ZAROXOLYN) 5 MG TABLET    Take 1 tablet (5 mg total) by mouth daily.   PANTOPRAZOLE (PROTONIX) 40 MG TABLET    Take 1 tablet (40 mg total) by mouth daily.    Physical Exam: There were no vitals filed for this visit. Physical Exam  Constitutional: No distress.  Cardiovascular: Normal rate, regular rhythm and normal heart sounds.   Pulmonary/Chest: Effort normal and breath sounds normal. No respiratory distress.  Abdominal: Soft. Bowel sounds are normal. He exhibits no distension and no mass. There is no tenderness.  Musculoskeletal: Normal range of  motion.  Skin:  Left heel ulcer dressed with allevyn and kerlix wrap; plaque-like rash of upper extremities, maculopapular rash on legs    Labs reviewed: Basic Metabolic Panel:  Recent Labs  40/98/11 2300  07/03/13 0416  07/19/13 0416 07/20/13 0603 07/21/13 0500  NA 146*  < > 141  < > 149* 149* 147  K 3.0*  < > 3.6*  < > 4.3 4.4 4.6  CL 110  < > 104  < > 114* 115* 112  CO2 28  < > 24  < > 24 24  24  GLUCOSE 179*  < > 187*  < > 144* 170* 153*  BUN 14  < > 47*  < > 32* 28* 25*  CREATININE 1.43*  < > 2.63*  < > 2.14* 1.94* 1.86*  CALCIUM 8.2*  < > 8.0*  < > 7.9* 7.5* 7.7*  MG 2.0  --  2.1  --   --   --   --   < > = values in this interval not displayed. Liver Function Tests:  Recent Labs  06/30/13 0405 07/04/13 0358 07/15/13 1645  AST 9 13 12   ALT <5 7 <5  ALKPHOS 67 81 64  BILITOT 0.3 0.3 0.2*  PROT 7.3 6.7 7.7  ALBUMIN 2.5* 2.7* 2.4*    Recent Labs  03/06/13 1800  LIPASE 8*  CBC:  Recent Labs  03/16/13 2112  06/29/13 1532  07/15/13 1645  07/18/13 0600  07/19/13 0416 07/21/13 0500 07/21/13 1850  WBC 10.8*  < > 15.4*  < > 9.0  < > 7.2  --  8.3 8.9  --   NEUTROABS 9.5*  --  12.7*  --  6.6  --   --   --   --   --   --   HGB 9.5*  < > 11.2*  < > 8.4*  < > 8.2*  < > 8.3* 7.9* 9.3*  HCT 29.5*  < > 33.7*  < > 25.9*  < > 25.7*  < > 25.9* 24.5* 28.6*  MCV 85.0  < > 83.8  < > 85.8  < > 83.7  --  84.1 84.2  --   PLT 229  < > 348  < > 266  < > 228  --  253 254  --   < > = values in this interval not displayed. Cardiac Enzymes:  Recent Labs  03/08/13 2210 03/09/13 0908 07/15/13 1659  TROPONINI <0.30 <0.30 <0.30   BNP: No components found with this basename: POCBNP,  CBG:  Recent Labs  07/21/13 0749 07/21/13 1140 07/21/13 1628  GLUCAP 120* 146* 162*   Procedures and Imaging Studies:   03/16/13: CT head: 1. No acute intracranial abnormality  2. Interval development of inflammatory paranasal sinus disease,  possibly acute sinusitis predominantly  affecting the right maxillary  sinus and ethmoid air cells.  3. Stable mild atrophy and chronic microvascular ischemic white  matter changes.  03/16/13: CXR: New consolidation of the right upper lung lobe, most compatible  with pneumonia. Small right pleural effusion again noted. An  underlying obstructing lesion cannot be entirely excluded; would  recommend follow-up CT of the chest after completion of treatment  for pneumonia.  03/22/13: abd xray: No acute abnormality noted.  Assessment/Plan:   1. Physical deconditioning -has been working with therapy here--this will be continued with home health   2. Type II or unspecified type diabetes mellitus with peripheral circulatory disorders, not stated as uncontrolled(250.70) -had some difficulty with hyperglycemia requiring adjustments--will be d/c with novolog for cbg over 200 at meals  3. Diastolic CHF -stable at this time with sodium and 2000 cc fluid restriction and daily weight monitoring  4. Candidal dermatitis -cont cream as in hpi -f/u with dermatology outpatient  5. Psoriasis (a type of skin inflammation) -cont hydrocortisone  6. Ulcer of left heel -cont allevyn with kerlix wraps daily, f/u with podiatry  Patient is being discharged with home health services:  RN for wound care (left heel wound--allevyn, kerlix, wrap daily), PT  Patient is being discharged with  the following durable medical equipment:  Wound care supplies  Patient has been advised to f/u with their PCP in 1-2 weeks to bring them up to date on their rehab stay.  They were provided with a 30 day supply of scripts for prescription medications and refills must be obtained from their PCP.    Labs/tests ordered:  F/u cbc, bmp in 1-2 wks recommended

## 2013-06-07 ENCOUNTER — Ambulatory Visit: Payer: Medicare Other | Admitting: Podiatrist

## 2013-06-07 ENCOUNTER — Ambulatory Visit (INDEPENDENT_AMBULATORY_CARE_PROVIDER_SITE_OTHER): Payer: Medicare Other | Admitting: Podiatrist

## 2013-06-07 ENCOUNTER — Encounter: Payer: Self-pay | Admitting: Podiatrist

## 2013-06-07 VITALS — BP 118/77 | HR 98 | Resp 16

## 2013-06-07 DIAGNOSIS — L97409 Non-pressure chronic ulcer of unspecified heel and midfoot with unspecified severity: Secondary | ICD-10-CM

## 2013-06-07 DIAGNOSIS — B351 Tinea unguium: Secondary | ICD-10-CM

## 2013-06-07 DIAGNOSIS — L97422 Non-pressure chronic ulcer of left heel and midfoot with fat layer exposed: Secondary | ICD-10-CM

## 2013-06-07 DIAGNOSIS — M79609 Pain in unspecified limb: Secondary | ICD-10-CM

## 2013-06-07 MED ORDER — HYDROCODONE-ACETAMINOPHEN 5-325 MG PO TABS: 1.0000 | ORAL_TABLET | ORAL | Status: DC | PRN

## 2013-06-07 MED ORDER — CLINDAMYCIN HCL 300 MG PO CAPS: 300.0000 mg | ORAL_CAPSULE | Freq: Three times a day (TID) | ORAL | Status: DC

## 2013-06-07 NOTE — Progress Notes (Signed)
  Subjective: [name] presents today for a painful ulcer on the left heel for about 2 months duration. He was recently hospitalized for cardiac issues and was then sent to a rehabilitation facility where the ulceration was identified. Patient relates it's painful especially with pressure. Iodine and saline dressings have been applied with no relief in symptoms.  Objective: Vascular exam is intact but decreased with DP pulses at 1+ out of 4 left and 0+ out of 4 PT pulse left. Capillary refill time is decreased. Normal proximal to distal cooling is noted. Neurological sensation is intact and the patient had has exquisite tenderness along the posterior heel left. The patient had a large area of black eschar along the posterior heel left. No tendon or bone exposure is seen however the entire posterior aspect of the left heel and calcaneus appears to be involved. This appears to be pressure related injury.  Assessment: Decubitus heel ulcer left noninfected Plan: With the size and nature of the ulceration I recommended a referral to the wound care center. The wound care center referral was carried out at today's visit. The area was cleansed with saline prep and a dressing was applied. The patient will be given instructions on following up with the wound care center for further treatment. I will be happy to see him for routine care in the future and if he has any future questions or concerns.

## 2013-06-07 NOTE — Patient Instructions (Addendum)
Continue to dress the heel ulcer with iodine/saline gauze wrap-- the wound center will call you to make the appointment for a consultation and treatment of the heel ulcer.

## 2013-06-09 ENCOUNTER — Ambulatory Visit: Payer: Medicare Other | Admitting: Podiatrist

## 2013-06-29 ENCOUNTER — Encounter (HOSPITAL_BASED_OUTPATIENT_CLINIC_OR_DEPARTMENT_OTHER): Payer: Medicare Other | Attending: Internal Medicine

## 2013-06-29 ENCOUNTER — Emergency Department (HOSPITAL_COMMUNITY): Payer: Medicare Other

## 2013-06-29 ENCOUNTER — Inpatient Hospital Stay (HOSPITAL_COMMUNITY): Payer: Medicare Other

## 2013-06-29 ENCOUNTER — Encounter (HOSPITAL_COMMUNITY): Payer: Self-pay | Admitting: Emergency Medicine

## 2013-06-29 ENCOUNTER — Inpatient Hospital Stay (HOSPITAL_COMMUNITY)
Admission: EM | Admit: 2013-06-29 | Discharge: 2013-07-05 | DRG: 496 | Disposition: A | Discharge status: Skilled Nursing Facility | Payer: Medicare Other | Attending: Internal Medicine | Admitting: Internal Medicine

## 2013-06-29 DIAGNOSIS — R251 Tremor, unspecified: Secondary | ICD-10-CM

## 2013-06-29 DIAGNOSIS — D638 Anemia in other chronic diseases classified elsewhere: Secondary | ICD-10-CM | POA: Diagnosis present

## 2013-06-29 DIAGNOSIS — R5381 Other malaise: Secondary | ICD-10-CM | POA: Diagnosis present

## 2013-06-29 DIAGNOSIS — L988 Other specified disorders of the skin and subcutaneous tissue: Secondary | ICD-10-CM | POA: Insufficient documentation

## 2013-06-29 DIAGNOSIS — Z8614 Personal history of Methicillin resistant Staphylococcus aureus infection: Secondary | ICD-10-CM

## 2013-06-29 DIAGNOSIS — D72829 Elevated white blood cell count, unspecified: Secondary | ICD-10-CM | POA: Diagnosis present

## 2013-06-29 DIAGNOSIS — Z791 Long term (current) use of non-steroidal anti-inflammatories (NSAID): Secondary | ICD-10-CM

## 2013-06-29 DIAGNOSIS — I509 Heart failure, unspecified: Secondary | ICD-10-CM | POA: Diagnosis present

## 2013-06-29 DIAGNOSIS — R4182 Altered mental status, unspecified: Secondary | ICD-10-CM

## 2013-06-29 DIAGNOSIS — E1142 Type 2 diabetes mellitus with diabetic polyneuropathy: Secondary | ICD-10-CM | POA: Diagnosis present

## 2013-06-29 DIAGNOSIS — R601 Generalized edema: Secondary | ICD-10-CM

## 2013-06-29 DIAGNOSIS — S99929A Unspecified injury of unspecified foot, initial encounter: Principal | ICD-10-CM

## 2013-06-29 DIAGNOSIS — Z79899 Other long term (current) drug therapy: Secondary | ICD-10-CM

## 2013-06-29 DIAGNOSIS — R918 Other nonspecific abnormal finding of lung field: Secondary | ICD-10-CM

## 2013-06-29 DIAGNOSIS — L02419 Cutaneous abscess of limb, unspecified: Secondary | ICD-10-CM | POA: Diagnosis present

## 2013-06-29 DIAGNOSIS — Z7401 Bed confinement status: Secondary | ICD-10-CM

## 2013-06-29 DIAGNOSIS — X58XXXA Exposure to other specified factors, initial encounter: Secondary | ICD-10-CM | POA: Insufficient documentation

## 2013-06-29 DIAGNOSIS — N179 Acute kidney failure, unspecified: Secondary | ICD-10-CM | POA: Diagnosis present

## 2013-06-29 DIAGNOSIS — Z993 Dependence on wheelchair: Secondary | ICD-10-CM

## 2013-06-29 DIAGNOSIS — J9811 Atelectasis: Secondary | ICD-10-CM

## 2013-06-29 DIAGNOSIS — M869 Osteomyelitis, unspecified: Principal | ICD-10-CM | POA: Diagnosis present

## 2013-06-29 DIAGNOSIS — E870 Hyperosmolality and hypernatremia: Secondary | ICD-10-CM

## 2013-06-29 DIAGNOSIS — L02619 Cutaneous abscess of unspecified foot: Secondary | ICD-10-CM | POA: Diagnosis present

## 2013-06-29 DIAGNOSIS — R609 Edema, unspecified: Secondary | ICD-10-CM

## 2013-06-29 DIAGNOSIS — E46 Unspecified protein-calorie malnutrition: Secondary | ICD-10-CM | POA: Diagnosis present

## 2013-06-29 DIAGNOSIS — E1159 Type 2 diabetes mellitus with other circulatory complications: Secondary | ICD-10-CM

## 2013-06-29 DIAGNOSIS — I129 Hypertensive chronic kidney disease with stage 1 through stage 4 chronic kidney disease, or unspecified chronic kidney disease: Secondary | ICD-10-CM | POA: Diagnosis present

## 2013-06-29 DIAGNOSIS — E876 Hypokalemia: Secondary | ICD-10-CM | POA: Diagnosis not present

## 2013-06-29 DIAGNOSIS — Z6831 Body mass index (BMI) 31.0-31.9, adult: Secondary | ICD-10-CM

## 2013-06-29 DIAGNOSIS — N184 Chronic kidney disease, stage 4 (severe): Secondary | ICD-10-CM | POA: Diagnosis present

## 2013-06-29 DIAGNOSIS — I4891 Unspecified atrial fibrillation: Secondary | ICD-10-CM | POA: Diagnosis present

## 2013-06-29 DIAGNOSIS — N049 Nephrotic syndrome with unspecified morphologic changes: Secondary | ICD-10-CM | POA: Diagnosis present

## 2013-06-29 DIAGNOSIS — I10 Essential (primary) hypertension: Secondary | ICD-10-CM

## 2013-06-29 DIAGNOSIS — Z794 Long term (current) use of insulin: Secondary | ICD-10-CM

## 2013-06-29 DIAGNOSIS — I5032 Chronic diastolic (congestive) heart failure: Secondary | ICD-10-CM | POA: Diagnosis present

## 2013-06-29 DIAGNOSIS — J90 Pleural effusion, not elsewhere classified: Secondary | ICD-10-CM

## 2013-06-29 DIAGNOSIS — R4781 Slurred speech: Secondary | ICD-10-CM

## 2013-06-29 DIAGNOSIS — R7881 Bacteremia: Secondary | ICD-10-CM

## 2013-06-29 DIAGNOSIS — L89609 Pressure ulcer of unspecified heel, unspecified stage: Secondary | ICD-10-CM | POA: Diagnosis present

## 2013-06-29 DIAGNOSIS — D62 Acute posthemorrhagic anemia: Secondary | ICD-10-CM | POA: Diagnosis not present

## 2013-06-29 DIAGNOSIS — N189 Chronic kidney disease, unspecified: Secondary | ICD-10-CM

## 2013-06-29 DIAGNOSIS — R579 Shock, unspecified: Secondary | ICD-10-CM

## 2013-06-29 DIAGNOSIS — N039 Chronic nephritic syndrome with unspecified morphologic changes: Secondary | ICD-10-CM

## 2013-06-29 DIAGNOSIS — L8992 Pressure ulcer of unspecified site, stage 2: Secondary | ICD-10-CM | POA: Diagnosis present

## 2013-06-29 DIAGNOSIS — L03119 Cellulitis of unspecified part of limb: Secondary | ICD-10-CM

## 2013-06-29 DIAGNOSIS — R600 Localized edema: Secondary | ICD-10-CM

## 2013-06-29 DIAGNOSIS — S8990XA Unspecified injury of unspecified lower leg, initial encounter: Secondary | ICD-10-CM | POA: Insufficient documentation

## 2013-06-29 DIAGNOSIS — I959 Hypotension, unspecified: Secondary | ICD-10-CM | POA: Diagnosis present

## 2013-06-29 DIAGNOSIS — Z881 Allergy status to other antibiotic agents status: Secondary | ICD-10-CM

## 2013-06-29 DIAGNOSIS — D631 Anemia in chronic kidney disease: Secondary | ICD-10-CM | POA: Diagnosis present

## 2013-06-29 DIAGNOSIS — Z87891 Personal history of nicotine dependence: Secondary | ICD-10-CM

## 2013-06-29 DIAGNOSIS — S99919A Unspecified injury of unspecified ankle, initial encounter: Principal | ICD-10-CM

## 2013-06-29 DIAGNOSIS — S91309A Unspecified open wound, unspecified foot, initial encounter: Secondary | ICD-10-CM

## 2013-06-29 DIAGNOSIS — E1149 Type 2 diabetes mellitus with other diabetic neurological complication: Secondary | ICD-10-CM | POA: Diagnosis present

## 2013-06-29 DIAGNOSIS — E8809 Other disorders of plasma-protein metabolism, not elsewhere classified: Secondary | ICD-10-CM | POA: Diagnosis present

## 2013-06-29 DIAGNOSIS — E785 Hyperlipidemia, unspecified: Secondary | ICD-10-CM | POA: Diagnosis present

## 2013-06-29 DIAGNOSIS — T502X5A Adverse effect of carbonic-anhydrase inhibitors, benzothiadiazides and other diuretics, initial encounter: Secondary | ICD-10-CM | POA: Diagnosis not present

## 2013-06-29 DIAGNOSIS — R262 Difficulty in walking, not elsewhere classified: Secondary | ICD-10-CM

## 2013-06-29 DIAGNOSIS — L089 Local infection of the skin and subcutaneous tissue, unspecified: Secondary | ICD-10-CM | POA: Insufficient documentation

## 2013-06-29 LAB — COMPREHENSIVE METABOLIC PANEL
ALT: <5 U/L (ref 0–53)
AST: 11 U/L (ref 0–37)
Albumin: 2.9 g/dL — ABNORMAL LOW (ref 3.5–5.2)
Alkaline Phosphatase: 92 U/L (ref 39–117)
BUN: 46 mg/dL — ABNORMAL HIGH (ref 6–23)
CALCIUM: 9.3 mg/dL (ref 8.4–10.5)
CHLORIDE: 103 meq/L (ref 96–112)
CO2: 24 meq/L (ref 19–32)
Creatinine, Ser: 1.87 mg/dL — ABNORMAL HIGH (ref 0.50–1.35)
GFR calc Af Amer: 41 mL/min — ABNORMAL LOW (ref >90)
GFR calc non Af Amer: 35 mL/min — ABNORMAL LOW (ref >90)
Glucose, Bld: 218 mg/dL — ABNORMAL HIGH (ref 70–99)
Potassium: 4.4 mEq/L (ref 3.7–5.3)
Sodium: 142 mEq/L (ref 137–147)
Total Bilirubin: <0.2 mg/dL — ABNORMAL LOW (ref 0.3–1.2)
Total Protein: 8.8 g/dL — ABNORMAL HIGH (ref 6.0–8.3)

## 2013-06-29 LAB — CBC WITH DIFFERENTIAL/PLATELET
BASOS ABS: 0 10*3/uL (ref 0.0–0.1)
Basophils Relative: 0 % (ref 0–1)
EOS PCT: 4 % (ref 0–5)
Eosinophils Absolute: 0.5 10*3/uL (ref 0.0–0.7)
HEMATOCRIT: 33.7 % — AB (ref 39.0–52.0)
HEMOGLOBIN: 11.2 g/dL — AB (ref 13.0–17.0)
Lymphocytes Relative: 9 % — ABNORMAL LOW (ref 12–46)
Lymphs Abs: 1.4 10*3/uL (ref 0.7–4.0)
MCH: 27.9 pg (ref 26.0–34.0)
MCHC: 33.2 g/dL (ref 30.0–36.0)
MCV: 83.8 fL (ref 78.0–100.0)
Monocytes Absolute: 0.7 10*3/uL (ref 0.1–1.0)
Monocytes Relative: 5 % (ref 3–12)
Neutro Abs: 12.7 10*3/uL — ABNORMAL HIGH (ref 1.7–7.7)
Neutrophils Relative %: 83 % — ABNORMAL HIGH (ref 43–77)
Platelets: 348 10*3/uL (ref 150–400)
RBC: 4.02 MIL/uL — ABNORMAL LOW (ref 4.22–5.81)
RDW: 14.1 % (ref 11.5–15.5)
WBC: 15.4 10*3/uL — ABNORMAL HIGH (ref 4.0–10.5)

## 2013-06-29 LAB — URINALYSIS, ROUTINE W REFLEX MICROSCOPIC
BILIRUBIN URINE: NEGATIVE
Glucose, UA: NEGATIVE mg/dL
KETONES UR: NEGATIVE mg/dL
Nitrite: NEGATIVE
Protein, ur: 100 mg/dL — AB
SPECIFIC GRAVITY, URINE: 1.011 (ref 1.005–1.030)
UROBILINOGEN UA: 0.2 mg/dL (ref 0.0–1.0)
pH: 5 (ref 5.0–8.0)

## 2013-06-29 LAB — URINE MICROSCOPIC-ADD ON

## 2013-06-29 LAB — GLUCOSE, CAPILLARY: Glucose-Capillary: 203 mg/dL — ABNORMAL HIGH (ref 70–99)

## 2013-06-29 LAB — CG4 I-STAT (LACTIC ACID): Lactic Acid, Venous: 2.58 mmol/L — ABNORMAL HIGH (ref 0.5–2.2)

## 2013-06-29 LAB — SEDIMENTATION RATE: Sed Rate: 135 mm/hr — ABNORMAL HIGH (ref 0–16)

## 2013-06-29 MED ORDER — TAMSULOSIN HCL 0.4 MG PO CAPS
0.4000 mg | ORAL_CAPSULE | Freq: Every day | ORAL | Status: DC
  Administered 2013-06-29 – 2013-07-05 (×7): 0.4 mg via ORAL
  Filled 2013-06-29 (×7): qty 1

## 2013-06-29 MED ORDER — HEPARIN SODIUM (PORCINE) 5000 UNIT/ML IJ SOLN
5000.0000 [IU] | Freq: Three times a day (TID) | INTRAMUSCULAR | Status: DC
  Administered 2013-06-30 – 2013-07-01 (×6): 5000 [IU] via SUBCUTANEOUS
  Filled 2013-06-29 (×11): qty 1

## 2013-06-29 MED ORDER — ACETAMINOPHEN 650 MG RE SUPP: 650.0000 mg | Freq: Four times a day (QID) | RECTAL | Status: DC | PRN

## 2013-06-29 MED ORDER — CARVEDILOL 12.5 MG PO TABS
12.5000 mg | ORAL_TABLET | Freq: Two times a day (BID) | ORAL | Status: DC
  Administered 2013-06-30 – 2013-07-01 (×4): 12.5 mg via ORAL
  Filled 2013-06-29 (×7): qty 1

## 2013-06-29 MED ORDER — ONDANSETRON HCL 4 MG PO TABS: 4.0000 mg | ORAL_TABLET | Freq: Four times a day (QID) | ORAL | Status: DC | PRN

## 2013-06-29 MED ORDER — PIPERACILLIN-TAZOBACTAM 3.375 G IVPB
3.3750 g | Freq: Once | INTRAVENOUS | Status: DC
  Filled 2013-06-29: qty 50

## 2013-06-29 MED ORDER — INSULIN GLARGINE 100 UNIT/ML ~~LOC~~ SOLN
8.0000 [IU] | Freq: Every day | SUBCUTANEOUS | Status: DC
  Administered 2013-06-30 – 2013-07-02 (×4): 8 [IU] via SUBCUTANEOUS
  Filled 2013-06-29 (×5): qty 0.08

## 2013-06-29 MED ORDER — METOLAZONE 5 MG PO TABS
5.0000 mg | ORAL_TABLET | Freq: Every day | ORAL | Status: DC
Start: 2013-06-30 — End: 2013-07-04
  Administered 2013-06-30 – 2013-07-04 (×5): 5 mg via ORAL
  Filled 2013-06-29 (×5): qty 1

## 2013-06-29 MED ORDER — PANTOPRAZOLE SODIUM 40 MG PO TBEC
40.0000 mg | DELAYED_RELEASE_TABLET | Freq: Every day | ORAL | Status: DC
  Administered 2013-06-29 – 2013-07-05 (×7): 40 mg via ORAL
  Filled 2013-06-29 (×7): qty 1

## 2013-06-29 MED ORDER — FUROSEMIDE 80 MG PO TABS
80.0000 mg | ORAL_TABLET | Freq: Every day | ORAL | Status: DC
  Administered 2013-06-30: 80 mg via ORAL
  Filled 2013-06-29: qty 1

## 2013-06-29 MED ORDER — ACETAMINOPHEN 325 MG PO TABS
650.0000 mg | ORAL_TABLET | Freq: Four times a day (QID) | ORAL | Status: DC | PRN
  Administered 2013-06-30 – 2013-07-02 (×2): 650 mg via ORAL
  Filled 2013-06-29 (×2): qty 2

## 2013-06-29 MED ORDER — HYDROCODONE-ACETAMINOPHEN 5-325 MG PO TABS
1.0000 | ORAL_TABLET | Freq: Four times a day (QID) | ORAL | Status: DC | PRN
  Administered 2013-06-30 – 2013-07-05 (×7): 1 via ORAL
  Filled 2013-06-29 (×7): qty 1

## 2013-06-29 MED ORDER — PIPERACILLIN-TAZOBACTAM 3.375 G IVPB
3.3750 g | Freq: Three times a day (TID) | INTRAVENOUS | Status: DC
  Administered 2013-06-30 – 2013-07-05 (×17): 3.375 g via INTRAVENOUS
  Filled 2013-06-29 (×19): qty 50

## 2013-06-29 MED ORDER — VANCOMYCIN HCL IN DEXTROSE 1-5 GM/200ML-% IV SOLN
1000.0000 mg | INTRAVENOUS | Status: AC
  Administered 2013-06-30: 1000 mg via INTRAVENOUS
  Filled 2013-06-29: qty 200

## 2013-06-29 MED ORDER — INSULIN ASPART 100 UNIT/ML ~~LOC~~ SOLN
0.0000 [IU] | Freq: Three times a day (TID) | SUBCUTANEOUS | Status: DC
  Administered 2013-06-30: 5 [IU] via SUBCUTANEOUS
  Administered 2013-06-30 (×2): 3 [IU] via SUBCUTANEOUS
  Administered 2013-07-01: 2 [IU] via SUBCUTANEOUS
  Administered 2013-07-01 – 2013-07-02 (×3): 3 [IU] via SUBCUTANEOUS
  Administered 2013-07-02 (×2): 2 [IU] via SUBCUTANEOUS
  Administered 2013-07-03: 3 [IU] via SUBCUTANEOUS
  Administered 2013-07-03: 2 [IU] via SUBCUTANEOUS
  Administered 2013-07-03 – 2013-07-04 (×2): 5 [IU] via SUBCUTANEOUS
  Administered 2013-07-04 (×2): 3 [IU] via SUBCUTANEOUS
  Administered 2013-07-05: 2 [IU] via SUBCUTANEOUS

## 2013-06-29 MED ORDER — ONDANSETRON HCL 4 MG/2ML IJ SOLN: 4.0000 mg | Freq: Four times a day (QID) | INTRAMUSCULAR | Status: DC | PRN

## 2013-06-29 MED ORDER — VANCOMYCIN HCL 10 G IV SOLR
1500.0000 mg | INTRAVENOUS | Status: DC
  Administered 2013-06-30 – 2013-07-01 (×2): 1500 mg via INTRAVENOUS
  Filled 2013-06-29 (×3): qty 1500

## 2013-06-29 MED ORDER — VANCOMYCIN HCL IN DEXTROSE 1-5 GM/200ML-% IV SOLN
1000.0000 mg | Freq: Once | INTRAVENOUS | Status: AC
  Administered 2013-06-29: 1000 mg via INTRAVENOUS
  Filled 2013-06-29: qty 200

## 2013-06-29 MED ORDER — GADOBENATE DIMEGLUMINE 529 MG/ML IV SOLN
10.0000 mL | Freq: Once | INTRAVENOUS | Status: AC | PRN
  Administered 2013-06-29: 10 mL via INTRAVENOUS

## 2013-06-29 MED ORDER — INSULIN ASPART 100 UNIT/ML ~~LOC~~ SOLN
0.0000 [IU] | Freq: Every day | SUBCUTANEOUS | Status: DC
  Administered 2013-06-30: 2 [IU] via SUBCUTANEOUS
  Administered 2013-07-01: 3 [IU] via SUBCUTANEOUS

## 2013-06-29 NOTE — ED Notes (Signed)
Pt referred to ED from wound care center. Family reports the wound on the heel of the patient's left foot began in October while in a rehab facility for PT. Pt was a new patient today at the wound care center. The note from the wound care center refers to a large necrotic wound with surrounding infection. Note states patient "needs IV antibiotics/? MRI" and also states this is "probably not a salvageable situation" and consult for BKA may be needed.

## 2013-06-29 NOTE — ED Notes (Signed)
Patient transported to MRI 

## 2013-06-29 NOTE — ED Notes (Signed)
Pt is a/o x 4.  Family reports family is unable ambulate d/t the foot issues.  Pt has a large necrotic wound on heel of left foot.  Foul smell present and small amount of drainage noted.  Family also reports pt does not have control of pt's bowels.

## 2013-06-29 NOTE — ED Notes (Signed)
Pt still in MRI and will go up to floor upon return.

## 2013-06-29 NOTE — ED Provider Notes (Signed)
CSN: [id number]     Arrival date & time 06/29/13  1451 History   First MD Initiated Contact with Patient 06/29/13 1623     Chief Complaint  Patient presents with  . Wound Check   (Consider location/radiation/quality/duration/timing/severity/associated sxs/prior Treatment) HPI Justin Steib Sr. is a 68 yo AA male with a PMH of DM who presents to the ED at the request of his wound care specialist because of a non-healing left heel ulcer. The ulcer began as a bruise in October, and has progressed into a painful infection. The pain radiates up his leg with ambulation and has prevented the patient wrom walking the past 2-3 weeks. PO Clindamycin has failed to improve the ulcer. The patient denies fever, malaise, paresthesias, CP or SOB.   Past Medical History  Diagnosis Date  . Diabetes mellitus   . Hypertension   . Hyperlipidemia   . CHF (congestive heart failure)   . Enlarged heart   . Pneumonia     hx of    Past Surgical History  Procedure Laterality Date  . Right leg fracture    . Skin graft    . Knee surgery    . Tee without cardioversion  03/17/2012    Procedure: TRANSESOPHAGEAL ECHOCARDIOGRAM (TEE);  Surgeon: Ricki Rodriguez, MD;  Location: [facility] ENDOSCOPY;  Service: Cardiovascular;  Laterality: N/A;   No family history on file. History  Substance Use Topics  . Smoking status: Former Smoker    Quit date: 03/10/1984  . Smokeless tobacco: Never Used  . Alcohol Use: No    Review of Systems  Allergies  Ancef and Sulfa antibiotics  Home Medications   Current Outpatient Rx  Name  Route  Sig  Dispense  Refill  . acetaminophen (TYLENOL) 500 MG tablet   Oral   Take 500 mg by mouth every 6 (six) hours as needed for pain.         Marland Kitchen aspirin 81 MG tablet   Oral   Take 81 mg by mouth daily.         . carvedilol (COREG) 12.5 MG tablet   Oral   Take 1 tablet (12.5 mg total) by mouth 2 (two) times daily with a meal.   60 tablet   3   . clindamycin (CLEOCIN) 300 MG  capsule   Oral   Take 1 capsule (300 mg total) by mouth 3 (three) times daily.   30 capsule   2   . furosemide (LASIX) 80 MG tablet   Oral   Take 80 mg by mouth daily.         Marland Kitchen HYDROcodone-acetaminophen (NORCO/VICODIN) 5-325 MG per tablet   Oral   Take 1-2 tablets by mouth every 4 (four) hours as needed.   40 tablet   0   . insulin aspart (NOVOLOG) 100 UNIT/ML injection   Subcutaneous   Inject 5 Units into the skin 3 (three) times daily before meals. For cbg >200         . Menthol-Methyl Salicylate (MUSCLE RUB) 10-15 % CREA   Topical   Apply 1 application topically as needed (neck and shoulder pain).         Marland Kitchen metolazone (ZAROXOLYN) 5 MG tablet   Oral   Take 1 tablet (5 mg total) by mouth daily.   30 tablet   3   . Multiple Vitamins-Minerals (CENTRUM SILVER ADULT 50+ PO)   Oral   Take 0.5 tablets by mouth daily.         Marland Kitchen  pantoprazole (PROTONIX) 40 MG tablet   Oral   Take 1 tablet (40 mg total) by mouth daily.   30 tablet   3   . Tamsulosin HCl (FLOMAX) 0.4 MG CAPS   Oral   Take 1 capsule (0.4 mg total) by mouth daily.   30 capsule   3   . triamcinolone cream (KENALOG) 0.1 %   Topical   Apply 1 application topically 3 (three) times daily.          BP 102/71  Pulse 94  Temp(Src) 98.8 F (37.1 C) (Oral)  Resp 16  SpO2 100% Physical Exam  Nursing note and vitals reviewed. Constitutional: He is oriented to person, place, and time. He appears well-developed and well-nourished.  HENT:  Head: Normocephalic and atraumatic.  Mouth/Throat: Oropharynx is clear and moist.  Eyes: Pupils are equal, round, and reactive to light.  Neck: Normal range of motion. Neck supple.  Cardiovascular: Normal rate, regular rhythm and normal heart sounds.  Exam reveals no gallop and no friction rub.   No murmur heard. Pulses:      Radial pulses are 2+ on the right side, and 2+ on the left side.       Dorsalis pedis pulses are 0 on the right side, and 0 on the left  side.       Posterior tibial pulses are 0 on the right side, and 0 on the left side.  Cap refill diminished bilaterally. Doppler demonstrates patent DP pulse.   Pulmonary/Chest: Effort normal and breath sounds normal. No respiratory distress. He has no wheezes. He has no rales.  Abdominal: Soft. Bowel sounds are normal. He exhibits no distension and no mass. There is no tenderness. There is no rebound and no guarding.  Musculoskeletal:  Open, weeping ulcer that is ~4cm X 4cm is present on patient's left heel. Surrounding this is significant swelling and heat. Edema present bilaterally (L>R). Diminished hair presence noticed bilaterally.   Neurological: He is alert and oriented to person, place, and time.  Skin: Skin is warm and dry. No rash noted. He is not diaphoretic. No erythema.    ED Course  Procedures (including critical care time) Labs Review Labs Reviewed  CBC WITH DIFFERENTIAL - Abnormal; Notable for the following:    WBC 15.4 (*)    RBC 4.02 (*)    Hemoglobin 11.2 (*)    HCT 33.7 (*)    Neutrophils Relative % 83 (*)    Neutro Abs 12.7 (*)    Lymphocytes Relative 9 (*)    All other components within normal limits  COMPREHENSIVE METABOLIC PANEL - Abnormal; Notable for the following:    Glucose, Bld 218 (*)    BUN 46 (*)    Creatinine, Ser 1.87 (*)    Total Protein 8.8 (*)    Albumin 2.9 (*)    Total Bilirubin <0.2 (*)    GFR calc non Af Amer 35 (*)    GFR calc Af Amer 41 (*)    All other components within normal limits  CG4 I-STAT (LACTIC ACID) - Abnormal; Notable for the following:    Lactic Acid, Venous 2.58 (*)    All other components within normal limits  URINALYSIS, ROUTINE W REFLEX MICROSCOPIC   Imaging Review Dg Foot Complete Left  06/29/2013   CLINICAL DATA:  Diabetes.  Left foot pain.  Wound check.  EXAM: LEFT FOOT - COMPLETE 3+ VIEW  COMPARISON:  None.  FINDINGS: Soft tissue wound noted over the calcaneus. No underlying  bony abnormality. No foreign body.  Diffuse degenerative change present. No evidence of fracture.  IMPRESSION: Soft tissue wound noted over calcaneus. No evidence of bony abnormality. No lytic bony abnormality. No foreign body.   Electronically Signed   By: Maisie Fus  Register   On: 06/29/2013 16:26    EKG Interpretation   None      CBC, CMP, and UA demonstrate elevated WBC count, Anemia (hgb 11.2), CKD and possible UTI. XRay of the left foot showed no bony involvement.  Given the severity of the ulcer, the PMH of DM, and resistance to PO Clindamycin, Justin Hansen Sr. was started on IV Vacnomycin and admitted to the hospital.  Patient be admitted to the hospital for further care  Carlyle Dolly, PA-C 07/03/13 1427

## 2013-06-29 NOTE — ED Notes (Signed)
Pt. Made aware for the need of urine. 

## 2013-06-29 NOTE — ED Notes (Signed)
Pt. Is unable to use the restroom at this time, but he is aware that we need a specimen.

## 2013-06-29 NOTE — ED Notes (Addendum)
RN, Christeen DouglasMerle made aware pt. CBG 203.

## 2013-06-29 NOTE — Progress Notes (Signed)
ANTIBIOTIC CONSULT NOTE - INITIAL  Pharmacy Consult for vancomycin, zosyn Indication: cellulitis/necrotic wound  Allergies  Allergen Reactions  . Ancef [Cefazolin] Itching and Rash    rash  . Sulfa Antibiotics Rash    Patient Measurements: Height: 5\' 11"  (180.3 cm) Weight: 226 lb (102.513 kg) IBW/kg (Calculated) : 75.3  Vital Signs: Temp: 99 F (37.2 C) (01/22 1855) Temp src: Oral (01/22 1855) BP: 154/64 mmHg (01/22 1855) Pulse Rate: 93 (01/22 1855) Intake/Output from previous day:   Intake/Output from this shift:    Labs:  Recent Labs  06/29/13 1532  WBC 15.4*  HGB 11.2*  PLT 348  CREATININE 1.87*   Estimated Creatinine Clearance: 46.1 ml/min (by C-G formula based on Cr of 1.87). No results found for this basename: VANCOTROUGH, VANCOPEAK, VANCORANDOM, GENTTROUGH, GENTPEAK, GENTRANDOM, TOBRATROUGH, TOBRAPEAK, TOBRARND, AMIKACINPEAK, AMIKACINTROU, AMIKACIN,  in the last 72 hours   Microbiology: No results found for this or any previous visit (from the past 720 hour(s)).  Medical History: Past Medical History  Diagnosis Date  . Diabetes mellitus   . Hypertension   . Hyperlipidemia   . CHF (congestive heart failure)   . Enlarged heart   . Pneumonia     hx of     Medications:  Scheduled:  . [START ON 06/30/2013] carvedilol  12.5 mg Oral BID WC  . [START ON 06/30/2013] furosemide  80 mg Oral Daily  . heparin  5,000 Units Subcutaneous Q8H  . [START ON 06/30/2013] insulin aspart  0-15 Units Subcutaneous TID WC  . insulin aspart  0-5 Units Subcutaneous QHS  . insulin glargine  8 Units Subcutaneous QHS  . [START ON 06/30/2013] metolazone  5 mg Oral Daily  . pantoprazole  40 mg Oral Daily  . tamsulosin  0.4 mg Oral Daily   Infusions:  . piperacillin-tazobactam (ZOSYN)  IV     Assessment: 68 yo male with large necrotic wound on heel of left foot with foul smell and some drainage. Wound on left foot began back in October and just saw wound care center today for  first time and note states that patient may need IV abx's and may need consult for BKA. Seems was on Vanc 1500mg  IV q48 back that gave random level of 19 on 10/15. Based on current CrCl of 38 ml/min/1.6373m2 and weight of 102.5kg, will start vancomycin 2g IV load (1g already given in ER) then 1500mg  IV q24. Also note that renal function is improved some from previous admission back in October. Baseline WBC 15.4, afebrile in ER  Goal of Therapy:  Vancomycin trough level 15-20 mcg/ml  Plan:  1) Give one more 1g dose of vancomycin to = 2g total as loading dose 2) Vancomycin 1500mg  IV q24 thereafter starting tomorrow 3) Zosyn 3.375g IV q8 (extended interval infusion)   Hessie KnowsJustin M Shiree Altemus, PharmD, BCPS Pager [phone] 06/29/2013 10:16 PM

## 2013-06-29 NOTE — H&P (Signed)
[facility] History and Physical  Patient: Justin Sayas Sr.  QQP:[id number]  DOB: 1945-06-13  DOS: the patient was seen and examined on 06/29/2013 PCP: Maggie Font, MD  Chief Complaint: Left leg ulcer  HPI: Justin Nurse Sr. is a 68 y.o. male with Past medical history of diabetes mellitus, hypertension, diastolic dysfunction, MRSA infection. The patient is coming from home. The patient presents with left leg ulcer. History obtained from patient's wife and patient. As per patient's wife after his last admission in October he was sent to nursing home where he started having an ulcer on his left heel. They were dressing the ulcer in the nursing home and after the discharge from the nursing home the patient's wife was continuously dressing the ulcer on every other day along with the wound care nurse. Since the ulcer was not improving the wife took the patient to her podiatrist who have refer the patient to wound care clinic. The podiatrist also place the patient on clindamycin. Unfortunately the patient was given 30 tablets on January 2 to finish a ten-day course and he still has five tablets left. The patient and wife mentions that he was not feeling hungry and therefore he has not been taking his medications. Although the patient mentions she is taking the rest of the medications consistently. Patient was also on Lantus but since his last admission has been placed only on sliding scale. And a sugar have been running in 300s. This morning his sugar was 400. There is no fall or trauma since the patient is essentially wheelchair bound and gets significant assistance from his wife for his daily ADL. Since last one week the patient has been complaining of pain in his leg and has not been able to bear any weight on the leg. Pt denies any fever, chills, headache, cough, chest pain, palpitation, shortness of breath, orthopnea, PND, nausea, vomiting, abdominal pain, diarrhea, constipation, active  bleeding, burning urination, dizziness, focal neurological deficit.   Review of Systems: as mentioned in the history of present illness.  A Comprehensive review of the other systems is negative.  Past Medical History  Diagnosis Date  . Diabetes mellitus   . Hypertension   . Hyperlipidemia   . CHF (congestive heart failure)   . Enlarged heart   . Pneumonia     hx of    Past Surgical History  Procedure Laterality Date  . Right leg fracture    . Skin graft    . Knee surgery    . Tee without cardioversion  03/17/2012    Procedure: TRANSESOPHAGEAL ECHOCARDIOGRAM (TEE);  Surgeon: Birdie Riddle, MD;  Location: [facility] ENDOSCOPY;  Service: Cardiovascular;  Laterality: N/A;   Social History:  reports that he quit smoking about 29 years ago. He has never used smokeless tobacco. He reports that he does not drink alcohol or use illicit drugs. dependent for most of his  ADL.  Allergies  Allergen Reactions  . Ancef [Cefazolin] Itching and Rash    rash  . Sulfa Antibiotics Rash    History reviewed. No pertinent family history.  Prior to Admission medications   Medication Sig Start Date End Date Taking? Authorizing Provider  carvedilol (COREG) 12.5 MG tablet Take 1 tablet (12.5 mg total) by mouth 2 (two) times daily with a meal. 03/14/13  Yes Clent Demark, MD  clindamycin (CLEOCIN) 300 MG capsule Take 1 capsule (300 mg total) by mouth 3 (three) times daily. 06/07/13  Yes Trudie Buckler, DPM  furosemide (LASIX) 80 MG  tablet Take 80 mg by mouth daily.   Yes Historical Provider, MD  HYDROcodone-acetaminophen (NORCO/VICODIN) 5-325 MG per tablet Take 1-2 tablets by mouth every 4 (four) hours as needed. 06/07/13  Yes Trudie Buckler, DPM  insulin aspart (NOVOLOG) 100 UNIT/ML injection Inject 5-22 Units into the skin 3 (three) times daily before meals. On sliding scale   Yes Historical Provider, MD  Menthol-Methyl Salicylate (MUSCLE RUB) 10-15 % CREA Apply 1 application topically as needed (neck  and shoulder pain).   Yes Historical Provider, MD  metolazone (ZAROXOLYN) 5 MG tablet Take 1 tablet (5 mg total) by mouth daily. 03/27/13  Yes Clent Demark, MD  Multiple Vitamins-Minerals (CENTRUM SILVER ADULT 50+ PO) Take 0.5 tablets by mouth daily.   Yes Historical Provider, MD  pantoprazole (PROTONIX) 40 MG tablet Take 1 tablet (40 mg total) by mouth daily. 03/14/13  Yes Clent Demark, MD  Tamsulosin HCl (FLOMAX) 0.4 MG CAPS Take 1 capsule (0.4 mg total) by mouth daily. 04/01/12  Yes Clent Demark, MD  triamcinolone cream (KENALOG) 0.1 % Apply 1 application topically 3 (three) times daily. On back and bottom   Yes Historical Provider, MD    Physical Exam: Filed Vitals:   06/29/13 1522 06/29/13 1855  BP: 102/71 154/64  Pulse: 94 93  Temp: 98.8 F (37.1 C) 99 F (37.2 C)  TempSrc: Oral Oral  Resp: 16 18  SpO2: 100% 99%    General: Alert, Awake and Oriented to Time, Place and Person. Appear in mild distress Eyes: PERRL ENT: Oral Mucosa clear moist. Neck: no JVD Cardiovascular: S1 and S2 Present, no Murmur, Peripheral Pulses Present Respiratory: Bilateral Air entry equal and Decreased, Clear to Auscultation,  no Crackles,no wheezes Abdomen: Bowel Sound Present, Soft and Non tender Skin: no Rash Extremities: Bilateral Pedal edema left more than right which is chronically like that, no calf tenderness, tenderness in left ankle and left knee with decreased range of motion. Large 4 cm size ulcer present with sloughing black base involving the heel Black discoloration of 3rd toe on left leg.  Neurologic: Grossly Unremarkable.  Labs on Admission:  CBC:  Recent Labs Lab 06/29/13 1532  WBC 15.4*  NEUTROABS 12.7*  HGB 11.2*  HCT 33.7*  MCV 83.8  PLT 348    CMP     Component Value Date/Time   NA 142 06/29/2013 1532   K 4.4 06/29/2013 1532   CL 103 06/29/2013 1532   CO2 24 06/29/2013 1532   GLUCOSE 218* 06/29/2013 1532   BUN 46* 06/29/2013 1532   CREATININE 1.87*  06/29/2013 1532   CALCIUM 9.3 06/29/2013 1532   PROT 8.8* 06/29/2013 1532   ALBUMIN 2.9* 06/29/2013 1532   AST 11 06/29/2013 1532   ALT <5 06/29/2013 1532   ALKPHOS 92 06/29/2013 1532   BILITOT <0.2* 06/29/2013 1532   GFRNONAA 35* 06/29/2013 1532   GFRAA 41* 06/29/2013 1532    No results found for this basename: LIPASE, AMYLASE,  in the last 168 hours No results found for this basename: AMMONIA,  in the last 168 hours  No results found for this basename: CKTOTAL, CKMB, CKMBINDEX, TROPONINI,  in the last 168 hours BNP (last 3 results)  Recent Labs  03/22/13 0513 03/23/13 0450 03/24/13 0415  PROBNP 16464.0* 20208.0* 20835.0*    Radiological Exams on Admission: Dg Foot Complete Left  06/29/2013   CLINICAL DATA:  Diabetes.  Left foot pain.  Wound check.  EXAM: LEFT FOOT - COMPLETE 3+ VIEW  COMPARISON:  None.  FINDINGS: Soft tissue wound noted over the calcaneus. No underlying bony abnormality. No foreign body. Diffuse degenerative change present. No evidence of fracture.  IMPRESSION: Soft tissue wound noted over calcaneus. No evidence of bony abnormality. No lytic bony abnormality. No foreign body.   Electronically Signed   By: Marcello Moores  Register   On: 06/29/2013 16:26   Assessment/Plan Principal Problem:   Cellulitis and abscess of foot Active Problems:   DIABETES MELLITUS, WITH VASCULAR COMPLICATIONS   diastolic CHF   Physical deconditioning   Diabetic peripheral neuropathy   Hypertension   1. 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 He does have history of diabetic neuropathy and main had developed a decubitus ulcer which may have got secondarily infected. At present I would give him IV vancomycin and IV Zosyn which he has tolerated in the past. Blood cultures, wound cultures. Wound dressing. I will get ESR CRP. Since the wound has not been healing and present since 3 months I will get an MRI for further workup as there  is very high suspicion for possible osteomyelitis. There is no fluctuation felt but based on the findings of the MRI he may require surgical consultation. He also appears to have a black discoloration of his third toe which might mention has been present since last few month and unchanged. Patient does not complain of any significant pain in the toe. I get ABI as well as venous Doppler of his leg.  2. Diabetes mellitus Continue sliding scale I will place the patient back on 8 units of Lantus. In the past he was on at least 40 units of Lantus.  3. Diastolic dysfunction with recurrent decompensation At present the patient does not appear volume overloaded. His serum creatinine has also improved significantly. I will resume Zaroxolyn and Lasix tomorrow. Patient does not appear hypovolemic as well.  4. Physical deconditioning PTOT consultation  DVT Prophylaxis: subcutaneous Heparin Nutrition: no  Code Status:  Full  Family Communication:  Wife was present at bedside, opportunity was given to ask question and all questions were answered satisfactorily at the time of interview. Disposition: Admitted to inpatient in med-surge unit.  Author: Berle Mull, MD [facility] Pager: [phone] 06/29/2013, 8:53 PM    If 7PM-7AM, please contact night-coverage [email or web address] Password [email or web address]

## 2013-06-29 NOTE — ED Notes (Signed)
Pt. Was asked again about using the restroom and he stated that he still was not able to use the restroom at this time and he would let us know when he able to use it.

## 2013-06-30 DIAGNOSIS — I509 Heart failure, unspecified: Secondary | ICD-10-CM

## 2013-06-30 DIAGNOSIS — N179 Acute kidney failure, unspecified: Secondary | ICD-10-CM

## 2013-06-30 DIAGNOSIS — N189 Chronic kidney disease, unspecified: Secondary | ICD-10-CM

## 2013-06-30 DIAGNOSIS — S91309A Unspecified open wound, unspecified foot, initial encounter: Secondary | ICD-10-CM

## 2013-06-30 DIAGNOSIS — I10 Essential (primary) hypertension: Secondary | ICD-10-CM

## 2013-06-30 DIAGNOSIS — M7989 Other specified soft tissue disorders: Secondary | ICD-10-CM

## 2013-06-30 LAB — GLUCOSE, CAPILLARY
GLUCOSE-CAPILLARY: 218 mg/dL — AB (ref 70–99)
GLUCOSE-CAPILLARY: 153 mg/dL — AB (ref 70–99)
Glucose-Capillary: 216 mg/dL — ABNORMAL HIGH (ref 70–99)
Glucose-Capillary: 184 mg/dL — ABNORMAL HIGH (ref 70–99)
Glucose-Capillary: 178 mg/dL — ABNORMAL HIGH (ref 70–99)
Glucose-Capillary: 151 mg/dL — ABNORMAL HIGH (ref 70–99)

## 2013-06-30 LAB — CBC
HEMATOCRIT: 31.3 % — AB (ref 39.0–52.0)
Hemoglobin: 10.4 g/dL — ABNORMAL LOW (ref 13.0–17.0)
MCH: 27.6 pg (ref 26.0–34.0)
MCHC: 33.2 g/dL (ref 30.0–36.0)
MCV: 83 fL (ref 78.0–100.0)
PLATELETS: 302 10*3/uL (ref 150–400)
RBC: 3.77 MIL/uL — ABNORMAL LOW (ref 4.22–5.81)
RDW: 14.3 % (ref 11.5–15.5)
WBC: 11.7 10*3/uL — AB (ref 4.0–10.5)

## 2013-06-30 LAB — COMPREHENSIVE METABOLIC PANEL
AST: 9 U/L (ref 0–37)
Albumin: 2.5 g/dL — ABNORMAL LOW (ref 3.5–5.2)
Alkaline Phosphatase: 67 U/L (ref 39–117)
BILIRUBIN TOTAL: 0.3 mg/dL (ref 0.3–1.2)
BUN: 47 mg/dL — ABNORMAL HIGH (ref 6–23)
CO2: 23 mEq/L (ref 19–32)
Calcium: 8.6 mg/dL (ref 8.4–10.5)
Chloride: 102 mEq/L (ref 96–112)
Creatinine, Ser: 1.96 mg/dL — ABNORMAL HIGH (ref 0.50–1.35)
GFR calc Af Amer: 39 mL/min — ABNORMAL LOW (ref >90)
GFR calc non Af Amer: 33 mL/min — ABNORMAL LOW (ref >90)
Glucose, Bld: 227 mg/dL — ABNORMAL HIGH (ref 70–99)
Potassium: 4.3 mEq/L (ref 3.7–5.3)
SODIUM: 139 meq/L (ref 137–147)
TOTAL PROTEIN: 7.3 g/dL (ref 6.0–8.3)

## 2013-06-30 LAB — PROTIME-INR
INR: 1.05 (ref 0.00–1.49)
Prothrombin Time: 13.5 seconds (ref 11.6–15.2)

## 2013-06-30 LAB — MRSA PCR SCREENING: MRSA by PCR: NEGATIVE

## 2013-06-30 LAB — C-REACTIVE PROTEIN: CRP: 5 mg/dL — ABNORMAL HIGH (ref <0.60)

## 2013-06-30 MED ORDER — SODIUM CHLORIDE 0.9 % IV BOLUS (SEPSIS)
500.0000 mL | Freq: Once | INTRAVENOUS | Status: AC
  Administered 2013-06-30: 500 mL via INTRAVENOUS

## 2013-06-30 MED ORDER — FUROSEMIDE 40 MG PO TABS
40.0000 mg | ORAL_TABLET | Freq: Every day | ORAL | Status: DC
  Administered 2013-07-01 – 2013-07-02 (×2): 40 mg via ORAL
  Filled 2013-06-30 (×2): qty 1

## 2013-06-30 MED ORDER — DIPHENHYDRAMINE HCL 50 MG/ML IJ SOLN
25.0000 mg | Freq: Four times a day (QID) | INTRAMUSCULAR | Status: DC | PRN
  Administered 2013-06-30 – 2013-07-04 (×6): 25 mg via INTRAVENOUS
  Filled 2013-06-30 (×6): qty 1

## 2013-06-30 NOTE — Progress Notes (Signed)
Rapid Response called to room 1301 per floor RN for pt with low bp. Per RN pt with ALOC, less responsive. Benedetto Coons Callahan NP called and 500 NS bolus ordered. Upon my arrival, 500 NS bolus infusing. Pt lethargic, oriented to name, follows commands. Pt placed on RRT monitor. Lung sounds diminished, weak congested cough. 02 sats 99% on RA no resp distress. Pt BP remains low while fluids infusing, T Callhan NP called second bolus ordered. RN encouraged to recheck bp after second bolus and monitor closely.

## 2013-06-30 NOTE — Progress Notes (Signed)
Inpatient Diabetes Program Recommendations  AACE/ADA: New Consensus Statement on Inpatient Glycemic Control (2013)  Target Ranges:  Prepandial:   less than 140 mg/dL      Peak postprandial:   less than 180 mg/dL (1-2 hours)      Critically ill patients:  140 - 180 mg/dL   Reason for Assessment: Results for [name], [name]. (MRN [id number]) as of 06/30/2013 10:20  Ref. Range 06/29/2013 23:54 06/30/2013 01:10 06/30/2013 08:02  Glucose-Capillary Latest Range: 70-99 mg/dL 409203 (H) 811216 (H) 914218 (H)   Home medications for diabetes: Novolog 5-22 units tid with meals.  Current Hospital medications for diabetes: Lantus 8 units daily and Moderate Novolog correction tid with meals and HS.  Recommendations:  Consider increasing Lantus to 15 units daily and add Novolog meal coverage 4 units tid with meals.  If meal coverage added, please reduce Novolog correction to sensitive.  Thanks, Beryl MeagerJenny Sophonie Goforth, RN, BC-ADM Inpatient Diabetes Coordinator Pager [phone]

## 2013-06-30 NOTE — Care Management Note (Signed)
   CARE MANAGEMENT NOTE 06/30/2013  Patient:  Mitzi HansenMARSHALL,Bassam   Account Number:  [id number]  Date Initiated:  06/30/2013  Documentation initiated by:  Ronney Honeywell  Subjective/Objective Assessment:   68 yo male admitted with cellulitis & abcess of foot.PCP: Evlyn CourierHILL,[name], MD     Action/Plan:   Home when stable   Anticipated DC Date:     Anticipated DC Plan:        DC Planning Services  CM consult      Choice offered to / List presented to:  NA   DME arranged  NA      DME agency  NA     HH arranged  NA      HH agency  NA   Status of service:  In process, will continue to follow Medicare Important Message given?   (If response is "NO", the following Medicare IM given date fields will be blank) Date Medicare IM given:   Date Additional Medicare IM given:    Discharge Disposition:    Per UR Regulation:  Reviewed for med. necessity/level of care/duration of stay  If discussed at Long Length of Stay Meetings, dates discussed:    Comments:  06/30/13 1103 Kasin Tonkinson,MSN,RN [phone] Chart reviewed for utilization of services. Will continue to follow for possible dc needs related to possible osteomyelitis and possible surgical consult.

## 2013-06-30 NOTE — Progress Notes (Signed)
[facility]  NAME:  [name], [name]             ACCOUNT NO.:  [id number]  MEDICAL RECORD NO.:  [id number]      DATE OF BIRTH:  [birth year]  PHYSICIAN:  Maxwell CaulMichael G. Denys Labree, M.D. VISIT DATE:  06/29/2013                                  OFFICE VISIT   Mr. [name] came in accompanied by his wife and son today for review of a wound on the left heel.  The history obtained is that he was in hospital from October 9 to October 20, quite ill at that time, cared for by Dr. Sharyn LullHarwani.  [facility] [place] [place]. The day after his discharge, the patient's wife was called to report a blister on the heel.  This subsequently opened and he was followed by the wound doctor in the facility.  [facility] [place] and was [facility] at [facility] and this ended on December 1st.  His wife has been dressing this with saline wash, iodine at home with wrap.  She saw Podiatry 2 weeks ago and was given an antibiotic 3 times a day.  The patient saw a primary physician earlier this week who recommended hospitalization for IV antibiotics.  Nevertheless, he is here for our review of this.  PAST MEDICAL HISTORY:  Includes diabetes, hypertension, hyperlipidemia, pneumonia, atrial fibrillation, PAD, and diabetic neuropathy.  MEDICATION:  List is reviewed.  He does not appear currently to be on antibiotics.  PHYSICAL EXAMINATION:  VITAL SIGNS:  Temperature is 98, pulse 103, respirations 16, blood pressure 142/82.  Blood glucose was 225.  Arterial evaluation in this clinic revealed an ABI on the left of 1.22.  Wound exam, the area was over a large area of the patient's left heel measuring 6.5 x 7.5 x 1.1.  This was covered with a necrotic black surface eschar.  There was surrounding swelling, pain, and probably ascending infection up the back of his leg/achilles area.  The patient was in a great deal of pain and  did not look particularly systemically well.  IMPRESSION:  Diabetic foot ulcer over the left heel with surrounding infection.  I think this has represented an urgent situation which required hospitalization.  Towards this end, I have referred [facility], I suspect he will need IV antibiotics and a surgical consultation.  Unfortunately, I do not think this is likely to be a salvageable situation in terms of the patient's leg, although we have to see what further studies in the hospital bring.  As mentioned the patient did not look particularly well and was in a lot of Discomfort. There is at least cellulitis, possible fascitis,and the likelihood of underlying osteomyelitis is high.           ______________________________ Maxwell CaulMichael G. Bertie Simien, M.D.    MGR/MEDQ  D:  06/29/2013  T:  06/30/2013  Job:  [id number]

## 2013-06-30 NOTE — Progress Notes (Signed)
Dr. Elisabeth Pigeonevine ordered to placed foley catheter,patient refused,wife at bedside was made aware about this matter. At this time patient has voided about 75cc in the foley bag, has a condom catheter,wife offered fluid to patient and his taking it.MD aware.Hulda Marin- Virna Livengood RN

## 2013-06-30 NOTE — Consult Note (Signed)
WOC wound consult note Reason for Consult: Nonhealing pressure ulcer to left heel, present on admission Pressure ulcer to right malleolus, present on admission Wound type: pressure ulcer, unastageable Stage II pressure ulcer to Right malleolus, present on admission Pressure Ulcer POA: Yes Measurement:  Left heel 6.7 cm x 7.5 cm, unable to visualize wound depth.  100% eschar Right malleolus measures 0.5 cm x 0.2 cm x 0.1 cm Wound bed: 100% dry eschar left heel.  Right malleolus is 100% clean, pink and moist.  Drainage (amount, consistency, odor) Moderate, purulent drainage with foul odor.  Periwound: Periwound is edematous and indurated.  Erythema is present circumferentially and foot is tender to touch.  Dressing procedure/placement/frequency: Right malleolus:  Clean wound with NS and pat gently dry.  Apply Allevyn silicone border foam dressing and change every 3 days.   Dry dressing in place at this time to left heel.  Awaiting surgical consult.  Will defer to surgery at this point. Patient and wife aware that left foot may not be salvageable.   Will not follow at this time.  Please re-consult if needed. Will remain available to patient, nursing and medical teams as needed.  Maple HudsonKaren Kaycie Pegues RN BSN CWON Pager [phone]

## 2013-06-30 NOTE — Progress Notes (Signed)
INITIAL NUTRITION ASSESSMENT  DOCUMENTATION CODES Per approved criteria  -Obesity Unspecified   INTERVENTION: -Will continue to monitor PO intake and supplement with Glucerna shakes as warranted  -Would benefit from DM2 diet education once more alert and/or family present   NUTRITION DIAGNOSIS: Inadequate oral intake related to n/v/lethargy as evidenced by PO intake <75%.   Goal: Pt to meet >/= 90% of their estimated nutrition needs    Monitor:  Total protein/energy intake, labs, weights, glucose profile, skin integrity, diet education needs  Reason for Assessment: MST  68 y.o. male  Admitting Dx: Cellulitis and abscess of foot  ASSESSMENT: The patient presents with left leg ulcer. History obtained from patient's wife and patient. As per patient's wife after his last admission in October he was sent to nursing home where he started having an ulcer on his left heel. They were dressing the ulcer in the nursing home and after the discharge from the nursing home the patient's wife was continuously dressing the ulcer on every other day along with the wound care nurse. Since the ulcer was not improving the wife took the patient to her podiatrist who have refer the patient to wound care clinic. The podiatrist also place the patient on clindamycin. Unfortunately the patient was given 30 tablets on January 2 to finish a ten-day course and he still has five tablets left. The patient and wife mentions that he was not feeling hungry and therefore he has not been taking his medications. Although the patient mentions she is taking the rest of the medications consistently.  Patient was also on Lantus but since his last admission has been placed only on sliding scale. And a sugar have been running in 300s. This morning his sugar was 400.  -Pt reported 2-3 days of poor PO intake d/t nausea/abd pain. Was not able to tolerate any liquid or solids. -Very lethargic during time of assessment, was not  interested in breakfast tray. Only consumed milk. Offered to order pt substitutions, but pt declined -Denied any changes in weight pta. -MD noted pt with diabetic foot ulcer on left heel with infection and recommended surgical consultation. Possible for osteomyelitis, may not be able to salvage leg. -Will monitor PO intake and add Glucerna shakes if pt's PO intake does not improve. Would benefit from additional protein for skin integrity -Will assess DM2 diet education needs once pt more alert. Family would also benefit from being present as pt is wheelchair bound at home and likely has assistance with meal prep from wife   Height: Ht Readings from Last 1 Encounters:  06/30/13 5\' 11"  (1.803 m)    Weight: Wt Readings from Last 1 Encounters:  06/30/13 226 lb (102.513 kg)    Ideal Body Weight: 172 lbs  % Ideal Body Weight: 128%  Wt Readings from Last 10 Encounters:  06/30/13 226 lb (102.513 kg)  04/24/13 209 lb (94.802 kg)  04/21/13 209 lb (94.802 kg)  04/11/13 212 lb (96.163 kg)  03/27/13 223 lb 15.8 oz (101.6 kg)  03/15/13 228 lb 2.8 oz (103.5 kg)  06/06/12 227 lb 4.7 oz (103.1 kg)  05/27/12 238 lb 6.4 oz (108.138 kg)  04/01/12 251 lb 12.3 oz (114.2 kg)  04/01/12 251 lb 12.3 oz (114.2 kg)    Usual Body Weight: 226 lbs- denied wt changes  % Usual Body Weight: 100%  BMI:  Body mass index is 31.53 kg/(m^2). Obesity 1  Estimated Nutritional Needs: Kcal: 2000-2200 Protein: 105-115 gram Fluid: >/= 2200 ml  Skin: Diabetic  foot ulcer, necrotic  Diet Order: Carb Control  EDUCATION NEEDS: -Education not appropriate at this time  No intake or output data in the 24 hours ending 06/30/13 1008  Last BM: 1/23   Labs:   Recent Labs Lab 06/29/13 1532 06/30/13 0405  NA 142 139  K 4.4 4.3  CL 103 102  CO2 24 23  BUN 46* 47*  CREATININE 1.87* 1.96*  CALCIUM 9.3 8.6  GLUCOSE 218* 227*    CBG (last 3)   Recent Labs  06/29/13 2354 06/30/13 0110 06/30/13 0802   GLUCAP 203* 216* 218*    Scheduled Meds: . carvedilol  12.5 mg Oral BID WC  . furosemide  80 mg Oral Daily  . heparin  5,000 Units Subcutaneous Q8H  . insulin aspart  0-15 Units Subcutaneous TID WC  . insulin aspart  0-5 Units Subcutaneous QHS  . insulin glargine  8 Units Subcutaneous QHS  . metolazone  5 mg Oral Daily  . pantoprazole  40 mg Oral Daily  . piperacillin-tazobactam (ZOSYN)  IV  3.375 g Intravenous Q8H  . tamsulosin  0.4 mg Oral Daily  . vancomycin  1,500 mg Intravenous Q24H    Continuous Infusions:   Past Medical History  Diagnosis Date  . Diabetes mellitus   . Hypertension   . Hyperlipidemia   . CHF (congestive heart failure)   . Enlarged heart   . Pneumonia     hx of     Past Surgical History  Procedure Laterality Date  . Right leg fracture    . Skin graft    . Knee surgery    . Tee without cardioversion  03/17/2012    Procedure: TRANSESOPHAGEAL ECHOCARDIOGRAM (TEE);  Surgeon: Ricki RodriguezAjay S Kadakia, MD;  [facility];  Service: Cardiovascular;  Laterality: N/A;    Lloyd HugerSarah F Lukah Goswami MS RD LDN Clinical Dietitian Pager:[phone]

## 2013-06-30 NOTE — Progress Notes (Signed)
VASCULAR LAB PRELIMINARY  PRELIMINARY  PRELIMINARY  PRELIMINARY  Left lower extremity venous duplex completed.    Preliminary report:  Left:  No evidence of DVT, superficial thrombosis, or Baker's cyst.  Teigen Bellin, RVT 06/30/2013, 9:11 AM

## 2013-06-30 NOTE — ED Notes (Signed)
Sarah, RN on [address] [place] made aware that pt is going up to the floor.

## 2013-06-30 NOTE — Progress Notes (Signed)
This RN and the NT went into the room to clean up pt. We could smell urine on pt. Pt stated, "Why are you coming into my room and bothering me?" We stated that he smells like he has wet himself, pt stated "That's too bad, please stop coming to my room and bothering me." This RN passed this on to the next nurse. Eugene GarnetSarah M Clairessa Boulet RN

## 2013-06-30 NOTE — Progress Notes (Signed)
[facility] PROGRESS NOTE  Justin Reiswig Sr. BJY:[id number] DOB: [birth year] DOA: 06/29/2013 PCP: [name], MD  Brief narrative: 68 y.o. male with past medical history of diabetes mellitus, hypertension, diastolic dysfunction, MRSA infection who presented to [facility] ED 06/29/2013 with left non heeling heel ulcer. On admission, vitals were stable. CBC revealed WBC count of 15.4, hemoglobin of 11.2. BMP revealed creatinine of 1.87. Left foot x ray revealed soft tissue wound over calcaneus but no bony abnormality. MRI of the left foot revealed cellulitic changes with large ulceration over the left heel, signal abnormality in the calcaneus consistent with osteomyelitis.  Assessment/Plan:  Principal Problem:   Left foot osteomyelitis - continue vanco and zosyn - Wound: Left heel 6.7 cm x 7.5 cm, 100% eschar. Right malleolus measures 0.5 cm x 0.2 cm x 0.1 cm ; recommended to clean wound with NS and pat gently dry, apply Allevyn silicone border foam dressing and change every 3 days. Dry dressing in place at this time to left heel.  - needs ortho consult; will call for official consultation - will get ABI   Active Problems: Diabetes, with peripheral neuropathy - check A1c - continue insulin sliding scale; regular regimen with Lantus 8 units at bedtime Chronic diastolic CHF - based on last 2 D ECHO EF 55% (03/2013) - BNP not checked on this admission - seems compensated, not in respiratory distress - on lasix and metolazone; decreased lasix to 40 mg instead of 80 mg due to risk of dehydration, pt not eating very well - also on coreg 12.5 mg PO BID - will call cardio in am  Code Status: full code Family Communication: wife at the bedside  Disposition Plan: remains inpatient   [name], MD  [facility] Pager [phone]  If 7PM-7AM, please contact night-coverage [email or web address] Password [email or web address] 06/30/2013, 7:21 AM   LOS: 1 day   Consultants:  WOC  Procedures:  None    Antibiotics:  Vanco 06/29/2013 -->  Zosyn 06/29/2013 -->  HPI/Subjective: No acute events overnight.   Objective: Filed Vitals:   06/29/13 2110 06/29/13 2356 06/30/13 0005 06/30/13 0107  BP:  134/72  114/60  Pulse:  119 84 117  Temp:  98.9 F (37.2 C)  98 F (36.7 C)  TempSrc:  Oral  Oral  Resp:  18  18  Height: 5\' 11"  (1.803 m)   5\' 11"  (1.803 m)  Weight: 102.513 kg (226 lb)   102.513 kg (226 lb)  SpO2:  99%  100%   No intake or output data in the 24 hours ending 06/30/13 0721  Exam:   General:  Pt is alert, follows commands appropriately, not in acute distress  Cardiovascular: Regular rate and rhythm, S1/S2 appreciated  Respiratory: Clear to auscultation bilaterally, no wheezing, no crackles, no rhonchi  Abdomen: Soft, non tender, non distended, bowel sounds present, no guarding  Extremities: draining left heel ulcer, also has right ankle lateral ulcer, stage I sacral ulcer; pedal edema appreciated on left  Neuro: Grossly nonfocal  Data Reviewed: Basic Metabolic Panel:  Recent Labs Lab 06/29/13 1532 06/30/13 0405  NA 142 139  K 4.4 4.3  CL 103 102  CO2 24 23  GLUCOSE 218* 227*  BUN 46* 47*  CREATININE 1.87* 1.96*  CALCIUM 9.3 8.6   Liver Function Tests:  Recent Labs Lab 06/29/13 1532 06/30/13 0405  AST 11 9  ALT <5 <5  ALKPHOS 92 67  BILITOT <0.2* 0.3  PROT 8.8* 7.3  ALBUMIN 2.9* 2.5*   No results  found for this basename: LIPASE, AMYLASE,  in the last 168 hours No results found for this basename: AMMONIA,  in the last 168 hours CBC:  Recent Labs Lab 06/29/13 1532 06/30/13 0405  WBC 15.4* 11.7*  NEUTROABS 12.7*  --   HGB 11.2* 10.4*  HCT 33.7* 31.3*  MCV 83.8 83.0  PLT 348 302   Cardiac Enzymes: No results found for this basename: CKTOTAL, CKMB, CKMBINDEX, TROPONINI,  in the last 168 hours BNP: No components found with this basename: POCBNP,  CBG:  Recent Labs Lab 06/29/13 2354 06/30/13 0110  GLUCAP 203* 216*   MRSA  PCR SCREENING     Status: None   Collection Time    06/30/13  1:04 AM      Result Value Range Status   MRSA by PCR NEGATIVE  NEGATIVE Final    Studies: Dg Foot Complete Left 06/29/2013    IMPRESSION: Soft tissue wound noted over calcaneus. No evidence of bony abnormality. No lytic bony abnormality. No foreign body.     Scheduled Meds: . carvedilol  12.5 mg Oral BID WC  . furosemide  40 mg Oral Daily  . insulin aspart  0-15 Units Subcutaneous TID WC  . insulin aspart  0-5 Units Subcutaneous QHS  . insulin glargine  8 Units Subcutaneous QHS  . metolazone  5 mg Oral Daily  . pantoprazole  40 mg Oral Daily  . piperacillin-tazobactam   3.375 g Intravenous Q8H  . tamsulosin  0.4 mg Oral Daily  . vancomycin  1,500 mg Intravenous Q24H

## 2013-06-30 NOTE — Progress Notes (Signed)
Patient did not void since 1115 this morning, when patient was asked if he has pressure in his bladder, he stated "No". Bladder scan done 235 ml noted. Not on IVF,has poor oral intake.Hulda Marin- Alsha Meland RN

## 2013-07-01 DIAGNOSIS — E1149 Type 2 diabetes mellitus with other diabetic neurological complication: Secondary | ICD-10-CM

## 2013-07-01 DIAGNOSIS — E1142 Type 2 diabetes mellitus with diabetic polyneuropathy: Secondary | ICD-10-CM

## 2013-07-01 DIAGNOSIS — I96 Gangrene, not elsewhere classified: Secondary | ICD-10-CM

## 2013-07-01 LAB — URINE CULTURE: Colony Count: 50000

## 2013-07-01 LAB — GLUCOSE, CAPILLARY
GLUCOSE-CAPILLARY: 155 mg/dL — AB (ref 70–99)
GLUCOSE-CAPILLARY: 234 mg/dL — AB (ref 70–99)
Glucose-Capillary: 147 mg/dL — ABNORMAL HIGH (ref 70–99)
Glucose-Capillary: 161 mg/dL — ABNORMAL HIGH (ref 70–99)

## 2013-07-01 LAB — HEMOGLOBIN A1C
Hgb A1c MFr Bld: 9.7 % — ABNORMAL HIGH (ref <5.7)
MEAN PLASMA GLUCOSE: 232 mg/dL — AB (ref <117)

## 2013-07-01 NOTE — Progress Notes (Signed)
SBP <90. Paged NP on call. Order for 500 ml bolus obtained. tylenol given for fever 100.6.

## 2013-07-01 NOTE — Progress Notes (Signed)
Patient currently finishing bolus.  Patient is lethargic & sweaty. With a new congested cough. Recheck BP 86/52, pulse 100, temp 101 ax. CBG 151. Less responsive than earlier. Patient arouses and just states "I don't feel good". Paged NP again and RRT RN. Wife at bedside. Order for another 500 ml bolus.

## 2013-07-01 NOTE — Consult Note (Signed)
Reason for Consult:  Left foot ulcer Referring Physician: Damier Disano Sr. is an 68 y.o. male.  HPI:   68 yo male with multiple medical problems who has been non-ambulatory for some time now.  Was admitted last evening due to a chronic left heel ulcer that has become infected and has associated cellulitis around his foot and ankle.  Ortho is consulted for management of this issue.  He reports severe left foot pain and states that he has been bed-bound for some time now.  Past Medical History  Diagnosis Date  . Diabetes mellitus   . Hypertension   . Hyperlipidemia   . CHF (congestive heart failure)   . Enlarged heart   . Pneumonia     hx of     Past Surgical History  Procedure Laterality Date  . Right leg fracture    . Skin graft    . Knee surgery    . Tee without cardioversion  03/17/2012    Procedure: TRANSESOPHAGEAL ECHOCARDIOGRAM (TEE);  Surgeon: Ricki Rodriguez, MD;  Location: [facility] ENDOSCOPY;  Service: Cardiovascular;  Laterality: N/A;    History reviewed. No pertinent family history.  Social History:  reports that he quit smoking about 29 years ago. He has never used smokeless tobacco. He reports that he does not drink alcohol or use illicit drugs.  Allergies:  Allergies  Allergen Reactions  . Ancef [Cefazolin] Itching and Rash    rash  . Sulfa Antibiotics Rash    Medications: I have reviewed the patient's current medications.  Results for orders placed during the hospital encounter of 06/29/13 (from the past 48 hour(s))  CBC WITH DIFFERENTIAL     Status: Abnormal   Collection Time    06/29/13  3:32 PM      Result Value Range   WBC 15.4 (*) 4.0 - 10.5 K/uL   RBC 4.02 (*) 4.22 - 5.81 MIL/uL   Hemoglobin 11.2 (*) 13.0 - 17.0 g/dL   HCT 52.5 (*) 91.0 - 28.9 %   MCV 83.8  78.0 - 100.0 fL   MCH 27.9  26.0 - 34.0 pg   MCHC 33.2  30.0 - 36.0 g/dL   RDW 02.2  84.0 - 69.8 %   Platelets 348  150 - 400 K/uL   Neutrophils Relative % 83 (*) 43 - 77 %   Neutro  Abs 12.7 (*) 1.7 - 7.7 K/uL   Lymphocytes Relative 9 (*) 12 - 46 %   Lymphs Abs 1.4  0.7 - 4.0 K/uL   Monocytes Relative 5  3 - 12 %   Monocytes Absolute 0.7  0.1 - 1.0 K/uL   Eosinophils Relative 4  0 - 5 %   Eosinophils Absolute 0.5  0.0 - 0.7 K/uL   Basophils Relative 0  0 - 1 %   Basophils Absolute 0.0  0.0 - 0.1 K/uL  COMPREHENSIVE METABOLIC PANEL     Status: Abnormal   Collection Time    06/29/13  3:32 PM      Result Value Range   Sodium 142  137 - 147 mEq/L   Potassium 4.4  3.7 - 5.3 mEq/L   Chloride 103  96 - 112 mEq/L   CO2 24  19 - 32 mEq/L   Glucose, Bld 218 (*) 70 - 99 mg/dL   BUN 46 (*) 6 - 23 mg/dL   Creatinine, Ser 6.14 (*) 0.50 - 1.35 mg/dL   Calcium 9.3  8.4 - 83.0 mg/dL   Total Protein  8.8 (*) 6.0 - 8.3 g/dL   Albumin 2.9 (*) 3.5 - 5.2 g/dL   AST 11  0 - 37 U/L   ALT <5  0 - 53 U/L   Alkaline Phosphatase 92  39 - 117 U/L   Total Bilirubin <0.2 (*) 0.3 - 1.2 mg/dL   GFR calc non Af Amer 35 (*) >90 mL/min   GFR calc Af Amer 41 (*) >90 mL/min   Comment: (NOTE)     The eGFR has been calculated using the CKD EPI equation.     This calculation has not been validated in all clinical situations.     eGFR's persistently <90 mL/min signify possible Chronic Kidney     Disease.  CG4 I-STAT (LACTIC ACID)     Status: Abnormal   Collection Time    06/29/13  4:12 PM      Result Value Range   Lactic Acid, Venous 2.58 (*) 0.5 - 2.2 mmol/L  SEDIMENTATION RATE     Status: Abnormal   Collection Time    06/29/13  9:25 PM      Result Value Range   Sed Rate 135 (*) 0 - 16 mm/hr  C-REACTIVE PROTEIN     Status: Abnormal   Collection Time    06/29/13  9:25 PM      Result Value Range   CRP 5.0 (*) <0.60 mg/dL   Comment: Performed at [facility]     Status: Abnormal   Collection Time    06/29/13  9:38 PM      Result Value Range   Color, Urine YELLOW  YELLOW   APPearance TURBID (*) CLEAR   Specific Gravity, Urine 1.011   1.005 - 1.030   pH 5.0  5.0 - 8.0   Glucose, UA NEGATIVE  NEGATIVE mg/dL   Hgb urine dipstick SMALL (*) NEGATIVE   Bilirubin Urine NEGATIVE  NEGATIVE   Ketones, ur NEGATIVE  NEGATIVE mg/dL   Protein, ur 100 (*) NEGATIVE mg/dL   Urobilinogen, UA 0.2  0.0 - 1.0 mg/dL   Nitrite NEGATIVE  NEGATIVE   Leukocytes, UA LARGE (*) NEGATIVE  URINE MICROSCOPIC-ADD ON     Status: Abnormal   Collection Time    06/29/13  9:38 PM      Result Value Range   Squamous Epithelial / LPF FEW (*) RARE   WBC, UA TOO NUMEROUS TO COUNT  <3 WBC/hpf   RBC / HPF 3-6  <3 RBC/hpf   Bacteria, UA FEW (*) RARE   Urine-Other FEW YEAST    URINE CULTURE     Status: None   Collection Time    06/29/13  9:38 PM      Result Value Range   Specimen Description URINE, CLEAN CATCH     Special Requests NONE     Culture  Setup Time       Value: 06/30/2013 03:46     Performed at [facility]       Value: 50,000 COLONIES/ML     Performed at [facility]   Culture       Value: Multiple bacterial morphotypes present, none predominant. Suggest appropriate recollection if clinically indicated.     Performed at [facility]   Report Status 07/01/2013 FINAL    GLUCOSE, CAPILLARY     Status: Abnormal   Collection Time    06/29/13 11:54 PM      Result Value Range  Glucose-Capillary 203 (*) 70 - 99 mg/dL  MRSA PCR SCREENING     Status: None   Collection Time    06/30/13  1:04 AM      Result Value Range   MRSA by PCR NEGATIVE  NEGATIVE   Comment:            The GeneXpert MRSA Assay (FDA     approved for NASAL specimens     only), is one component of a     comprehensive MRSA colonization     surveillance program. It is not     intended to diagnose MRSA     infection nor to guide or     monitor treatment for     MRSA infections.  GLUCOSE, CAPILLARY     Status: Abnormal   Collection Time    06/30/13  1:10 AM      Result Value Range   Glucose-Capillary 216 (*) 70 - 99 mg/dL    Comment 1 Notify RN    COMPREHENSIVE METABOLIC PANEL     Status: Abnormal   Collection Time    06/30/13  4:05 AM      Result Value Range   Sodium 139  137 - 147 mEq/L   Potassium 4.3  3.7 - 5.3 mEq/L   Chloride 102  96 - 112 mEq/L   CO2 23  19 - 32 mEq/L   Glucose, Bld 227 (*) 70 - 99 mg/dL   BUN 47 (*) 6 - 23 mg/dL   Creatinine, Ser 1.96 (*) 0.50 - 1.35 mg/dL   Calcium 8.6  8.4 - 10.5 mg/dL   Total Protein 7.3  6.0 - 8.3 g/dL   Albumin 2.5 (*) 3.5 - 5.2 g/dL   AST 9  0 - 37 U/L   ALT <5  0 - 53 U/L   Alkaline Phosphatase 67  39 - 117 U/L   Total Bilirubin 0.3  0.3 - 1.2 mg/dL   GFR calc non Af Amer 33 (*) >90 mL/min   GFR calc Af Amer 39 (*) >90 mL/min   Comment: (NOTE)     The eGFR has been calculated using the CKD EPI equation.     This calculation has not been validated in all clinical situations.     eGFR's persistently <90 mL/min signify possible Chronic Kidney     Disease.  CBC     Status: Abnormal   Collection Time    06/30/13  4:05 AM      Result Value Range   WBC 11.7 (*) 4.0 - 10.5 K/uL   RBC 3.77 (*) 4.22 - 5.81 MIL/uL   Hemoglobin 10.4 (*) 13.0 - 17.0 g/dL   HCT 31.3 (*) 39.0 - 52.0 %   MCV 83.0  78.0 - 100.0 fL   MCH 27.6  26.0 - 34.0 pg   MCHC 33.2  30.0 - 36.0 g/dL   RDW 14.3  11.5 - 15.5 %   Platelets 302  150 - 400 K/uL  PROTIME-INR     Status: None   Collection Time    06/30/13  4:05 AM      Result Value Range   Prothrombin Time 13.5  11.6 - 15.2 seconds   INR 1.05  0.00 - 1.49  GLUCOSE, CAPILLARY     Status: Abnormal   Collection Time    06/30/13  8:02 AM      Result Value Range   Glucose-Capillary 218 (*) 70 - 99 mg/dL   Comment 1 Documented in Chart  Comment 2 Notify RN    GLUCOSE, CAPILLARY     Status: Abnormal   Collection Time    06/30/13 11:55 AM      Result Value Range   Glucose-Capillary 184 (*) 70 - 99 mg/dL   Comment 1 Documented in Chart     Comment 2 Notify RN    GLUCOSE, CAPILLARY     Status: Abnormal   Collection Time     06/30/13  5:26 PM      Result Value Range   Glucose-Capillary 178 (*) 70 - 99 mg/dL   Comment 1 Documented in Chart     Comment 2 Notify RN    GLUCOSE, CAPILLARY     Status: Abnormal   Collection Time    06/30/13  8:19 PM      Result Value Range   Glucose-Capillary 153 (*) 70 - 99 mg/dL  GLUCOSE, CAPILLARY     Status: Abnormal   Collection Time    06/30/13  9:51 PM      Result Value Range   Glucose-Capillary 151 (*) 70 - 99 mg/dL  GLUCOSE, CAPILLARY     Status: Abnormal   Collection Time    07/01/13  7:43 AM      Result Value Range   Glucose-Capillary 155 (*) 70 - 99 mg/dL   Comment 1 Documented in Chart     Comment 2 Notify RN      Mr Foot Left W Wo Contrast  06/30/2013   CLINICAL DATA:  Diabetic patient with a nonhealing ulcer of the heel.  EXAM: MRI OF THE LEFT FOREFOOT WITHOUT AND WITH CONTRAST  TECHNIQUE: Multiplanar, multisequence MR imaging was performed both before and after administration of intravenous contrast.  CONTRAST:  10 mL MULTIHANCE GADOBENATE DIMEGLUMINE 529 MG/ML IV SOLN  COMPARISON:  None.  FINDINGS: A large skin ulceration is seen over the heel. There is edema and enhancement in the underlying calcaneus consistent osteomyelitis. Edema and enhancement is most prominent in the posterior 1.5 cm of the calcaneus from top-to-bottom. Signal changed extends 4 cm into the calcaneus at the level of the posterior facet of the subtalar joint. There is extensive subcutaneous edema about the ankle compatible with cellulitis. No abscess is identified. Patchy areas of signal abnormality and enhancement are seen in the distal 4 cm of the Achilles tendon and the tendon is thickened. There is no fluid about the Achilles. Visualized tendons about the ankle are otherwise unremarkable. No ligament tear is identified.  IMPRESSION: Cellulitis about the lower leg and with a large ulceration over the heel. Signal abnormality in the calcaneus as described above is consistent with osteomyelitis.  No abscess is identified.  Signal abnormality and thickening in the distal Achilles tendon is consistent with tendinosis. Edema and enhancement in the distal 4 cm of the tendon could be due to infection although there is no fluid about the tendon as is typically seen in infectious tenosynovitis.   Electronically Signed   By: Inge Rise M.D.   On: 06/30/2013 07:32   Mr Knee Left  Wo Contrast  06/30/2013   CLINICAL DATA:  Left knee pain and limited range of motion.  EXAM: MRI OF THE LEFT KNEE WITHOUT CONTRAST  TECHNIQUE: Multiplanar, multisequence MR imaging of the knee was performed. No intravenous contrast was administered.  COMPARISON:  None.  FINDINGS: Please note that IV contrast was not administered as the patient could not tolerate further scanning. Patient motion somewhat degrades this exam.  MENISCI  Medial meniscus: There  is an oblique tear in the posterior horn of the medial meniscus reaching the meniscal undersurface. No displaced fragment.  Lateral meniscus:  Intact.  LIGAMENTS  Cruciates:  Intact.  Collaterals:  Intact.  CARTILAGE  Patellofemoral:  Unremarkable.  Medial:  Unremarkable.  Lateral:  Unremarkable.  Joint:  Small joint effusion is present.  Popliteal Fossa:  No Baker cyst.  Extensor Mechanism:  Intact.  Bones: Minimal marrow edema is seen the posterior aspect of medial femoral condyle and medial tibial plateau.  IMPRESSION: Minimal marrow edema in the posterior medial femoral condyle and medial tibial plateau is likely related to degenerative disease. The appearance of the knee is not suggestive of septic joint or osteomyelitis.  Oblique tear posterior horn medial meniscus.   Electronically Signed   By: Inge Rise M.D.   On: 06/30/2013 07:26   Dg Foot Complete Left  06/29/2013   CLINICAL DATA:  Diabetes.  Left foot pain.  Wound check.  EXAM: LEFT FOOT - COMPLETE 3+ VIEW  COMPARISON:  None.  FINDINGS: Soft tissue wound noted over the calcaneus. No underlying bony abnormality.  No foreign body. Diffuse degenerative change present. No evidence of fracture.  IMPRESSION: Soft tissue wound noted over calcaneus. No evidence of bony abnormality. No lytic bony abnormality. No foreign body.   Electronically Signed   By: Marcello Moores  Register   On: 06/29/2013 16:26    ROS Blood pressure 105/85, pulse 107, temperature 99.2 F (37.3 C), temperature source Oral, resp. rate 18, height $RemoveBe'5\' 11"'[id number]$  (1.803 m), weight 102.513 kg (226 lb), SpO2 99.00%. Physical Exam  Musculoskeletal:       Feet:    Left ankle and foot with pitting edema.  I can palpate a pulse dorsally in his foot  Assessment/Plan: Left foot ulcer due to pressure with necrosis over a wide area and underlying osteomeylitis of the calcaneus 1)  I have spoke to Mr. Omalley in length and he understands fully that he will need an operation on his foot to get rid of the infected bone and attempt to clear his infection.  I am encouraged that he has a pulse in his foot.  I plan on surgery tomorrow 1/25 for an I&D of the hindfoot with removal of infected bone and tissue.  It is very important that nursing floats his heels to keep pressure at a minimum.  He understands that he could still end up with a BKA.  Fadumo Heng Y 07/01/2013, 10:16 AM

## 2013-07-01 NOTE — Progress Notes (Addendum)
[id number] Patient without any urine in drainage bag since 2400. Bladder scan done. Showing 499 ml in bladder. Patient denies pain or need to void.  [id number] patient drunk some water and was able to void 175 ml of cloudy mucous urine. Patient on flomax. Will continue to monitor. Patient still with condom catheter on.

## 2013-07-01 NOTE — Progress Notes (Signed)
[facility] PROGRESS NOTE  Justin HansenDonald Tzeng Sr. ZOX:[id number] DOB: 09/01/1945 DOA: 06/29/2013 PCP: Evlyn CourierHILL,[name], MD  Brief narrative: 68 y.o. male with past medical history of diabetes mellitus, hypertension, diastolic dysfunction, MRSA infection who [facility]/29/2013 with left non heeling heel ulcer. On admission, vitals were stable. CBC revealed WBC count of 15.4, hemoglobin of 11.2. BMP revealed creatinine of 1.87. Left foot x ray revealed soft tissue wound over calcaneus but no bony abnormality. MRI of the left foot revealed cellulitic changes with large ulceration over the left heel, signal abnormality in the calcaneus consistent with osteomyelitis.   Assessment/Plan:   Principal Problem:  Left foot osteomyelitis  - continue vanco and zosyn  - Wound: Left heel 6.7 cm x 7.5 cm, 100% eschar. Right malleolus measures 0.5 cm x 0.2 cm x 0.1 cm ; recommended to clean wound with NS and pat gently dry, apply Allevyn silicone border foam dressing and change every 3 days. Dry dressing in place at this time to left heel.  - appreciate orthopedic surgery consultation and recommendations  - follow up on ABI's  Active Problems:  Diabetes, with peripheral neuropathy  - A1c pending - CBG's in past 24 hours: 153, 151, 155 - continue insulin sliding scale; regular regimen with Lantus 8 units at bedtime  Chronic diastolic CHF  - based on last 2 D ECHO EF 55% (03/2013)  - BNP not checked on this admission  - seems compensated, not in respiratory distress  - on lasix and metolazone; decreased lasix to 40 mg instead of 80 mg due to risk of dehydration, pt not eating very well  - also on coreg 12.5 mg PO BID   Code Status: full code  Family Communication: wife at the bedside  Disposition Plan: remains inpatient   Consultants:  WOC Procedures:  None  Antibiotics:  Vanco 06/29/2013 -->  Zosyn 06/29/2013 -->   Manson PasseyEVINE, Breona Cherubin, MD  [facility] Pager [phone]  If 7PM-7AM,  please contact night-coverage [email or web address] Password [email or web address] 07/01/2013, 9:21 AM   LOS: 2 days    HPI/Subjective: Was hypotensive overnight.  Objective: Filed Vitals:   06/30/13 2220 06/30/13 2325 07/01/13 0206 07/01/13 0613  BP: 85/47 96/54 101/58 105/85  Pulse: 97 96 100 107  Temp:  98 F (36.7 C) 98.8 F (37.1 C) 99.2 F (37.3 C)  TempSrc:  Oral Oral Oral  Resp: 17 16 18 18   Height:      Weight:      SpO2: 97% 96% 97% 99%    Intake/Output Summary (Last 24 hours) at 07/01/13 [id number] Last data filed at 07/01/13 [id number]  Gross per 24 hour  Intake   1989 ml  Output    375 ml  Net   1614 ml    Exam:  General: Pt is alert, follows commands appropriately, not in acute distress  Cardiovascular: Regular rate and rhythm, S1/S2 appreciated  Respiratory: Clear to auscultation bilaterally, no wheezing, no crackles, no rhonchi  Abdomen: Soft, non tender, non distended, bowel sounds present, no guarding  Extremities: draining left heel ulcer, also has right ankle lateral ulcer, stage I sacral ulcer; pedal edema appreciated on left  Neuro: Grossly nonfocal   Data Reviewed: Basic Metabolic Panel:  Recent Labs Lab 06/29/13 1532 06/30/13 0405  NA 142 139  K 4.4 4.3  CL 103 102  CO2 24 23  GLUCOSE 218* 227*  BUN 46* 47*  CREATININE 1.87* 1.96*  CALCIUM 9.3 8.6   Liver Function Tests:  Recent Labs Lab  06/29/13 1532 06/30/13 0405  AST 11 9  ALT <5 <5  ALKPHOS 92 67  BILITOT <0.2* 0.3  PROT 8.8* 7.3  ALBUMIN 2.9* 2.5*   No results found for this basename: LIPASE, AMYLASE,  in the last 168 hours No results found for this basename: AMMONIA,  in the last 168 hours CBC:  Recent Labs Lab 06/29/13 1532 06/30/13 0405  WBC 15.4* 11.7*  NEUTROABS 12.7*  --   HGB 11.2* 10.4*  HCT 33.7* 31.3*  MCV 83.8 83.0  PLT 348 302   Cardiac Enzymes: No results found for this basename: CKTOTAL, CKMB, CKMBINDEX, TROPONINI,  in the last 168 hours BNP: No components found with this  basename: POCBNP,  CBG:  Recent Labs Lab 06/30/13 1155 06/30/13 1726 06/30/13 2019 06/30/13 2151 07/01/13 0743  GLUCAP 184* 178* 153* 151* 155*    URINE CULTURE     Status: None   Collection Time    06/29/13  9:38 PM      Result Value Range Status   Specimen Description URINE, CLEAN CATCH   Final   Value: Multiple bacterial morphotypes present, none predominant. Suggest appropriate recollection if clinically indicated.     Performed at [facility]   Report Status 07/01/2013 FINAL   Final  MRSA PCR SCREENING     Status: None   Collection Time    06/30/13  1:04 AM      Result Value Range Status   MRSA by PCR NEGATIVE  NEGATIVE Final     Studies: Mr Foot Left W Wo Contrast 06/30/2013     IMPRESSION: Cellulitis about the lower leg and with a large ulceration over the heel. Signal abnormality in the calcaneus as described above is consistent with osteomyelitis. No abscess is identified.  Signal abnormality and thickening in the distal Achilles tendon is consistent with tendinosis. Edema and enhancement in the distal 4 cm of the tendon could be due to infection although there is no fluid about the tendon as is typically seen in infectious tenosynovitis.   Electronically Signed   By: Drusilla Kanner M.D.   On: 06/30/2013 07:32   Mr Knee Left  Wo Contrast 06/30/2013    IMPRESSION: Minimal marrow edema in the posterior medial femoral condyle and medial tibial plateau is likely related to degenerative disease. The appearance of the knee is not suggestive of septic joint or osteomyelitis.  Oblique tear posterior horn medial meniscus.   Electronically Signed   By: Drusilla Kanner M.D.   On: 06/30/2013 07:26   Dg Foot Complete Left 06/29/2013    IMPRESSION: Soft tissue wound noted over calcaneus. No evidence of bony abnormality. No lytic bony abnormality. No foreign body.   Electronically Signed   By: Maisie Fus  Register   On: 06/29/2013 16:26    Scheduled Meds: . carvedilol  12.5 mg  Oral BID WC  . furosemide  40 mg Oral Daily  . heparin  5,000 Units Subcutaneous Q8H  . insulin aspart  0-15 Units Subcutaneous TID WC  . insulin aspart  0-5 Units Subcutaneous QHS  . insulin glargine  8 Units Subcutaneous QHS  . metolazone  5 mg Oral Daily  . pantoprazole  40 mg Oral Daily  . piperacillin-tazobactam (ZOSYN)  IV  3.375 g Intravenous Q8H  . tamsulosin  0.4 mg Oral Daily  . vancomycin  1,500 mg Intravenous Q24H   Continuous Infusions:

## 2013-07-01 NOTE — Progress Notes (Signed)
VASCULAR LAB PRELIMINARY  PRELIMINARY  PRELIMINARY  PRELIMINARY  VASCULAR LAB PRELIMINARY  ARTERIAL  ABI completed:    RIGHT    LEFT    PRESSURE WAVEFORM  PRESSURE WAVEFORM  BRACHIAL 117 Triphasic BRACHIAL 109 Triphasic  DP 115 Triphasic DP 137 Triphasic  PT 156 Triphasic PT 129 Triphasic    RIGHT LEFT  ABI 1.33 1.17   ABIs and Doppler waveforms are within normal limits bilaterally at rest  [name], RVS 07/01/2013, 11:03 AM

## 2013-07-02 ENCOUNTER — Inpatient Hospital Stay (HOSPITAL_COMMUNITY): Payer: Medicare Other | Admitting: Anesthesiology

## 2013-07-02 ENCOUNTER — Encounter (HOSPITAL_COMMUNITY): Payer: Medicare Other | Admitting: Anesthesiology

## 2013-07-02 ENCOUNTER — Encounter (HOSPITAL_COMMUNITY)
Admission: EM | Disposition: A | Discharge status: Skilled Nursing Facility | Payer: Self-pay | Source: Home / Self Care | Attending: Internal Medicine

## 2013-07-02 HISTORY — PX: INCISION AND DRAINAGE: SHX5863

## 2013-07-02 LAB — GLUCOSE, CAPILLARY
GLUCOSE-CAPILLARY: 126 mg/dL — AB (ref 70–99)
Glucose-Capillary: 166 mg/dL — ABNORMAL HIGH (ref 70–99)
Glucose-Capillary: 121 mg/dL — ABNORMAL HIGH (ref 70–99)
Glucose-Capillary: 132 mg/dL — ABNORMAL HIGH (ref 70–99)

## 2013-07-02 LAB — BASIC METABOLIC PANEL
BUN: 50 mg/dL — ABNORMAL HIGH (ref 6–23)
CO2: 19 meq/L (ref 19–32)
CREATININE: 2.63 mg/dL — AB (ref 0.50–1.35)
Calcium: 7.9 mg/dL — ABNORMAL LOW (ref 8.4–10.5)
Chloride: 106 mEq/L (ref 96–112)
GFR calc Af Amer: 27 mL/min — ABNORMAL LOW (ref >90)
GFR calc non Af Amer: 23 mL/min — ABNORMAL LOW (ref >90)
Glucose, Bld: 136 mg/dL — ABNORMAL HIGH (ref 70–99)
Potassium: 4.1 mEq/L (ref 3.7–5.3)
Sodium: 140 mEq/L (ref 137–147)

## 2013-07-02 LAB — SURGICAL PCR SCREEN
MRSA, PCR: NEGATIVE
STAPHYLOCOCCUS AUREUS: NEGATIVE

## 2013-07-02 LAB — VANCOMYCIN, TROUGH: Vancomycin Tr: 33.1 ug/mL (ref 10.0–20.0)

## 2013-07-02 SURGERY — INCISION AND DRAINAGE
Anesthesia: General | Site: Foot | Laterality: Left

## 2013-07-02 MED ORDER — MORPHINE SULFATE 2 MG/ML IJ SOLN: 1.0000 mg | INTRAMUSCULAR | Status: DC | PRN

## 2013-07-02 MED ORDER — MIDAZOLAM HCL 2 MG/2ML IJ SOLN
INTRAMUSCULAR | Status: AC
  Filled 2013-07-02: qty 2

## 2013-07-02 MED ORDER — PROPOFOL 10 MG/ML IV BOLUS
INTRAVENOUS | Status: AC
  Filled 2013-07-02: qty 20

## 2013-07-02 MED ORDER — PHENYLEPHRINE HCL 10 MG/ML IJ SOLN
INTRAMUSCULAR | Status: DC | PRN
  Administered 2013-07-02 (×2): 80 ug via INTRAVENOUS

## 2013-07-02 MED ORDER — FUROSEMIDE 10 MG/ML IJ SOLN
40.0000 mg | Freq: Every day | INTRAMUSCULAR | Status: DC
  Administered 2013-07-02 – 2013-07-05 (×4): 40 mg via INTRAVENOUS
  Filled 2013-07-02 (×4): qty 4

## 2013-07-02 MED ORDER — SODIUM CHLORIDE 0.9 % IR SOLN
Status: DC | PRN
  Administered 2013-07-02 (×2): 1000 mL

## 2013-07-02 MED ORDER — FENTANYL CITRATE 0.05 MG/ML IJ SOLN
INTRAMUSCULAR | Status: AC
  Filled 2013-07-02: qty 2

## 2013-07-02 MED ORDER — LACTATED RINGERS IV SOLN
INTRAVENOUS | Status: DC | PRN
  Administered 2013-07-02: 08:00:00 via INTRAVENOUS

## 2013-07-02 MED ORDER — ONDANSETRON HCL 4 MG/2ML IJ SOLN
INTRAMUSCULAR | Status: AC
  Filled 2013-07-02: qty 2

## 2013-07-02 MED ORDER — HYDROMORPHONE HCL PF 1 MG/ML IJ SOLN
1.0000 mg | INTRAMUSCULAR | Status: DC | PRN
  Administered 2013-07-02: 1 mg via INTRAVENOUS
  Filled 2013-07-02 (×2): qty 1

## 2013-07-02 MED ORDER — MIDAZOLAM HCL 5 MG/5ML IJ SOLN
INTRAMUSCULAR | Status: DC | PRN
  Administered 2013-07-02: 2 mg via INTRAVENOUS

## 2013-07-02 MED ORDER — LACTATED RINGERS IV SOLN: INTRAVENOUS | Status: DC

## 2013-07-02 MED ORDER — PROPOFOL 10 MG/ML IV BOLUS
INTRAVENOUS | Status: DC | PRN
  Administered 2013-07-02: 160 mg via INTRAVENOUS

## 2013-07-02 MED ORDER — ONDANSETRON HCL 4 MG/2ML IJ SOLN
INTRAMUSCULAR | Status: DC | PRN
  Administered 2013-07-02: 4 mg via INTRAVENOUS

## 2013-07-02 MED ORDER — PROMETHAZINE HCL 25 MG/ML IJ SOLN: 6.2500 mg | INTRAMUSCULAR | Status: DC | PRN

## 2013-07-02 MED ORDER — LIDOCAINE HCL (CARDIAC) 20 MG/ML IV SOLN
INTRAVENOUS | Status: DC | PRN
  Administered 2013-07-02: 100 mg via INTRAVENOUS

## 2013-07-02 MED ORDER — 0.9 % SODIUM CHLORIDE (POUR BTL) OPTIME
TOPICAL | Status: DC | PRN
  Administered 2013-07-02: 1000 mL

## 2013-07-02 MED ORDER — ALBUMIN HUMAN 25 % IV SOLN
50.0000 g | Freq: Once | INTRAVENOUS | Status: AC
  Administered 2013-07-02: 50 g via INTRAVENOUS
  Filled 2013-07-02: qty 200

## 2013-07-02 MED ORDER — CARVEDILOL 6.25 MG PO TABS
6.2500 mg | ORAL_TABLET | Freq: Two times a day (BID) | ORAL | Status: DC
  Administered 2013-07-03 – 2013-07-05 (×5): 6.25 mg via ORAL
  Filled 2013-07-02 (×7): qty 1

## 2013-07-02 MED ORDER — LIDOCAINE HCL (CARDIAC) 20 MG/ML IV SOLN
INTRAVENOUS | Status: AC
  Filled 2013-07-02: qty 5

## 2013-07-02 SURGICAL SUPPLY — 36 items
BAG SPEC THK2 15X12 ZIP CLS (MISCELLANEOUS) ×1
BAG ZIPLOCK 12X15 (MISCELLANEOUS) ×3 IMPLANT
BANDAGE ESMARK 6X9 LF (GAUZE/BANDAGES/DRESSINGS) ×1 IMPLANT
BANDAGE GAUZE ELAST BULKY 4 IN (GAUZE/BANDAGES/DRESSINGS) ×2 IMPLANT
BNDG CMPR 9X6 STRL LF SNTH (GAUZE/BANDAGES/DRESSINGS) ×1
BNDG COHESIVE 6X5 TAN STRL LF (GAUZE/BANDAGES/DRESSINGS) ×2 IMPLANT
BNDG ESMARK 6X9 LF (GAUZE/BANDAGES/DRESSINGS) ×3
BNDG GAUZE ELAST 4 BULKY (GAUZE/BANDAGES/DRESSINGS) ×3 IMPLANT
CUFF TOURN SGL QUICK 18 (TOURNIQUET CUFF) IMPLANT
CUFF TOURN SGL QUICK 24 (TOURNIQUET CUFF)
CUFF TOURN SGL QUICK 34 (TOURNIQUET CUFF)
CUFF TRNQT CYL 24X4X40X1 (TOURNIQUET CUFF) IMPLANT
CUFF TRNQT CYL 34X4X40X1 (TOURNIQUET CUFF) IMPLANT
DRAIN PENROSE 18X1/2 LTX STRL (DRAIN) ×1 IMPLANT
DURAPREP 26ML APPLICATOR (WOUND CARE) ×3 IMPLANT
ELECT REM PT RETURN 9FT ADLT (ELECTROSURGICAL) ×3
ELECTRODE REM PT RTRN 9FT ADLT (ELECTROSURGICAL) ×1 IMPLANT
GAUZE XEROFORM 5X9 LF (GAUZE/BANDAGES/DRESSINGS) ×2 IMPLANT
GLOVE BIO SURGEON STRL SZ7.5 (GLOVE) ×3 IMPLANT
GLOVE BIOGEL PI IND STRL 8 (GLOVE) ×1 IMPLANT
GLOVE BIOGEL PI INDICATOR 8 (GLOVE) ×2
GLOVE ECLIPSE 8.0 STRL XLNG CF (GLOVE) ×3 IMPLANT
GOWN STRL REUS W/TWL XL LVL3 (GOWN DISPOSABLE) ×3 IMPLANT
HANDPIECE INTERPULSE COAX TIP (DISPOSABLE) ×3
KIT BASIN OR (CUSTOM PROCEDURE TRAY) ×3 IMPLANT
PACK LOWER EXTREMITY WL (CUSTOM PROCEDURE TRAY) ×3 IMPLANT
PAD ABD 8X10 STRL (GAUZE/BANDAGES/DRESSINGS) ×4 IMPLANT
PAD CAST 4YDX4 CTTN HI CHSV (CAST SUPPLIES) ×1 IMPLANT
PADDING CAST COTTON 4X4 STRL (CAST SUPPLIES) ×3
POSITIONER SURGICAL ARM (MISCELLANEOUS) ×7 IMPLANT
SET HNDPC FAN SPRY TIP SCT (DISPOSABLE) ×1 IMPLANT
SPONGE GAUZE 4X4 12PLY (GAUZE/BANDAGES/DRESSINGS) ×3 IMPLANT
SUT ETHILON 2 0 PS N (SUTURE) ×4 IMPLANT
SUT ETHILON 2 0 PSLX (SUTURE) ×4 IMPLANT
SYR CONTROL 10ML LL (SYRINGE) ×3 IMPLANT
TOWEL OR 17X26 10 PK STRL BLUE (TOWEL DISPOSABLE) ×6 IMPLANT

## 2013-07-02 NOTE — Op Note (Signed)
NAMMitzi Hansen:  Talford, Jeray             ACCOUNT NO.:  [id number]  MEDICAL RECORD NO.:  [id number]  LOCATION:  1301                         [facility]  PHYSICIAN:  Vanita PandaChristopher Y. Magnus IvanBlackman, M.D.DATE OF BIRTH:  1945/06/20  DATE OF PROCEDURE:  07/02/2013 DATE OF DISCHARGE:                              OPERATIVE REPORT   PREOPERATIVE DIAGNOSES:  Large left decubitus ulcer over the heel/calcaneus with cellulitis abscess and osteomyelitis of the calcaneus.  POSTOPERATIVE DIAGNOSES:  Large left decubitus ulcer over the heel/calcaneus with cellulitis abscess and osteomyelitis of the calcaneus.  PROCEDURE:  Irrigation and debridement of left foot ulcer measuring 5 cm x 7 cm, with sharp excisional debridement of skin, soft tissue, muscle, and bone including partial calcaneus excision.  SURGEON:  Vanita PandaChristopher Y. Magnus IvanBlackman, M.D.  ANESTHESIA:  General.  BLOOD LOSS:  Less than 50 mL.  COMPLICATIONS:  None.  INDICATION:  [name] is a 68 year old nonambulatory individual, who has developed pressure ulcer under his left heel.  He can tell it has been going on for a while several large area.  There is overlying necrotic tissue.  There is also foul odor and the gross purulence and the MRI did show diminished changes of the bone consistent with osteomyelitis.  He does have a pulse in his foot.  He is having significant amount of pain that he may end up with a below-knee amputation.  I have talked to him at length about trying to see we can control the infection with the irrigation debridement of partial removed the calcaneus since he does not walk.  He agrees with attempting this.  PROCEDURE DESCRIPTION:  After informed consent, appropriate left foot was marked.  He was brought to the operating room.  General anesthesia was obtained.  He was then placed in a lateral position with padding down on operative right leg, padding between his both legs and arms, axillary roll in place as well  as padding of the head and neck.  His left operative foot was then prepped and draped with Betadine paint. Time-out was called to identify the correct patient, correct left ankle. I then used an Esmarch to towel around the ankle as a local tourniquet. I used a #10 blade and sharply excised skin, underlying fascia and muscle around the calcaneus.  I then used an osteotome and performed a large wide calcaneal excision.  We were able to get back to a nice hard bone, I ellipsed out other skin and necrotic tissue was sharply with a knife, and then used 3 L normal saline solution, pulsatile lavage through the entire wound.  I then was able to reapproximate the tissue with interrupted 2-0 nylon suture.  Xeroform and a well-padded sterile dressing was applied.  He was awakened, extubated, taken to recovery room in stable condition.  All final counts were correct.  There were no complications noted.  Postoperatively, he will remain with floating his heels at all times to keep pressure off of the heel.     Vanita Pandahristopher Y. Magnus IvanBlackman, M.D.     CYB/MEDQ  D:  07/02/2013  T:  07/02/2013  Job:  [id number]

## 2013-07-02 NOTE — Transfer of Care (Signed)
Immediate Anesthesia Transfer of Care Note  Patient: Justin Hansenonald Sohn Sr.  Procedure(s) Performed: Procedure(s): INCISION AND DRAINAGE LEFT FOOT (Left)  Patient Location: PACU  Anesthesia Type:General  Level of Consciousness: sedated  Airway & Oxygen Therapy: Patient Spontanous Breathing and Patient connected to face mask oxygen  Post-op Assessment: Report given to PACU RN and Post -op Vital signs reviewed and stable  Post vital signs: Reviewed and stable  Complications: No apparent anesthesia complications

## 2013-07-02 NOTE — Progress Notes (Signed)
Patient ID: Justin HansenDonald Hardaway Sr., male   DOB: 05/26/1946, 68 y.o.   MRN: [id number] Proceeding to the OR this am for an I&D of his left foot due to osteo and large ulcer.  He gives informed consent.

## 2013-07-02 NOTE — Brief Op Note (Signed)
06/29/2013 - 07/02/2013  8:44 AM  PATIENT:  Mitzi Hansenonald Altemus Sr.  68 y.o. male  PRE-OPERATIVE DIAGNOSIS:  foot ulcer  POST-OPERATIVE DIAGNOSIS:  foot ulcer  PROCEDURE:  Procedure(s): INCISION AND DRAINAGE LEFT FOOT (Left)  SURGEON:  Surgeon(s) and Role:    * Kathryne Hitchhristopher Y Jame Morrell, MD - Primary  PHYSICIAN ASSISTANT:   ASSISTANTS: none   ANESTHESIA:   general  EBL:  Total I/O In: 600 [I.V.:600] Out: -   BLOOD ADMINISTERED:none  DRAINS: none   LOCAL MEDICATIONS USED:  NONE  SPECIMEN:  No Specimen  DISPOSITION OF SPECIMEN:  N/A  COUNTS:  YES  TOURNIQUET:  * No tourniquets in log *  DICTATION: .Other Dictation: Dictation Number [id number]  PLAN OF CARE: Admit to inpatient   PATIENT DISPOSITION:  PACU - hemodynamically stable.   Delay start of Pharmacological VTE agent (>24hrs) due to surgical blood loss or risk of bleeding: no

## 2013-07-02 NOTE — Anesthesia Postprocedure Evaluation (Signed)
Anesthesia Post Note  Patient: Justin Hansenonald Carie Sr.  Procedure(s) Performed: Procedure(s) (LRB): INCISION AND DRAINAGE LEFT FOOT (Left)  Anesthesia type: General  Patient location: PACU  Post pain: Pain level controlled  Post assessment: Post-op Vital signs reviewed  Last Vitals:  Filed Vitals:   07/02/13 0920  BP: 134/63  Pulse: 88  Temp: 36.2 C  Resp: 20    Post vital signs: Reviewed  Level of consciousness: sedated  Complications: No apparent anesthesia complications

## 2013-07-02 NOTE — Consult Note (Signed)
Reason for Consult:possible CHF/hyphypotension/worsening renal function Referring Physician: [facility]  Justin Sneddon Sr. is an 68 y.o. male.  LOV:FIEPPIR is 68 year old male with past medical history significant for multiple medical problems i.e. Hypertension, insulin requiring diabetes mellitus, history of congestive heart failure secondary to preserved LV systolic function, morbid obesity, chronic kidney disease stage IV, anemia of chronic disease, hypoalbuminemia probable nephrotic syndrome, deconditioning now bedbound mostly, was admitted days ago because of nonhealing ulcer of the left foot noted to have calcaneal osteomyelitis requiring incision and drainage today. Cardiologic consultation is called as patient had worsening left leg swelling associated with worsening renal function and management of possible congestive heart failure. Patient denies any chest pain nausea vomiting diaphoresis. Denies any shortness of breath. Denies any PND orthopnea but complains of leg swelling . As per wife patient has been bedbound for last few weeks hent head this chronic ulcer for last few months requiring dressing without improvement so brought him to the hospital. Patient had duplex ultrasound of lower extremities which showed no evidence of DVT.  Past Medical History  Diagnosis Date  . Diabetes mellitus   . Hypertension   . Hyperlipidemia   . CHF (congestive heart failure)   . Enlarged heart   . Pneumonia     hx of     Past Surgical History  Procedure Laterality Date  . Right leg fracture    . Skin graft    . Knee surgery    . Tee without cardioversion  03/17/2012    Procedure: TRANSESOPHAGEAL ECHOCARDIOGRAM (TEE);  Surgeon: Birdie Riddle, MD;  Location: [facility] ENDOSCOPY;  Service: Cardiovascular;  Laterality: N/A;    History reviewed. No pertinent family history.  Social History:  reports that he quit smoking about 29 years ago. He has never used smokeless tobacco. He reports that he  does not drink alcohol or use illicit drugs.  Allergies:  Allergies  Allergen Reactions  . Ancef [Cefazolin] Itching and Rash    rash  . Sulfa Antibiotics Rash    Medications: I have reviewed the patient's current medications.  Results for orders placed during the hospital encounter of 06/29/13 (from the past 48 hour(s))  GLUCOSE, CAPILLARY     Status: Abnormal   Collection Time    06/30/13  8:19 PM      Result Value Range   Glucose-Capillary 153 (*) 70 - 99 mg/dL  GLUCOSE, CAPILLARY     Status: Abnormal   Collection Time    06/30/13  9:51 PM      Result Value Range   Glucose-Capillary 151 (*) 70 - 99 mg/dL  HEMOGLOBIN A1C     Status: Abnormal   Collection Time    07/01/13  4:04 AM      Result Value Range   Hemoglobin A1C 9.7 (*) <5.7 %   Comment: (NOTE)                                                                               According to the ADA Clinical Practice Recommendations for 2011, when     HbA1c is used as a screening test:      >=6.5%   Diagnostic of Diabetes Mellitus               (  if abnormal result is confirmed)     5.7-6.4%   Increased risk of developing Diabetes Mellitus     References:Diagnosis and Classification of Diabetes Mellitus,Diabetes     ZOXW,9604,54(UJWJX 1):S62-S69 and Standards of Medical Care in             Diabetes - 2011,Diabetes BJYN,8295,62 (Suppl 1):S11-S61.   Mean Plasma Glucose 232 (*) <117 mg/dL   Comment: Performed at [facility], CAPILLARY     Status: Abnormal   Collection Time    07/01/13  7:43 AM      Result Value Range   Glucose-Capillary 155 (*) 70 - 99 mg/dL   Comment 1 Documented in Chart     Comment 2 Notify RN    GLUCOSE, CAPILLARY     Status: Abnormal   Collection Time    07/01/13 11:45 AM      Result Value Range   Glucose-Capillary 147 (*) 70 - 99 mg/dL   Comment 1 Documented in Chart     Comment 2 Notify RN    GLUCOSE, CAPILLARY     Status: Abnormal   Collection Time    07/01/13  4:13 PM       Result Value Range   Glucose-Capillary 161 (*) 70 - 99 mg/dL   Comment 1 Documented in Chart     Comment 2 Notify RN    GLUCOSE, CAPILLARY     Status: Abnormal   Collection Time    07/01/13  9:23 PM      Result Value Range   Glucose-Capillary 234 (*) 70 - 99 mg/dL  SURGICAL PCR SCREEN     Status: None   Collection Time    07/02/13  3:54 AM      Result Value Range   MRSA, PCR NEGATIVE  NEGATIVE   Staphylococcus aureus NEGATIVE  NEGATIVE   Comment:            The Xpert SA Assay (FDA     approved for NASAL specimens     in patients over 59 years of age),     is one component of     a comprehensive surveillance     program.  Test performance has     been validated by [employer] for patients greater     than or equal to 18 year old.     It is not intended     to diagnose infection nor to     guide or monitor treatment.  GLUCOSE, CAPILLARY     Status: Abnormal   Collection Time    07/02/13  8:46 AM      Result Value Range   Glucose-Capillary 126 (*) 70 - 99 mg/dL   Comment 1 Documented in Chart    BASIC METABOLIC PANEL     Status: Abnormal   Collection Time    07/02/13  9:30 AM      Result Value Range   Sodium 140  137 - 147 mEq/L   Potassium 4.1  3.7 - 5.3 mEq/L   Chloride 106  96 - 112 mEq/L   CO2 19  19 - 32 mEq/L   Glucose, Bld 136 (*) 70 - 99 mg/dL   BUN 50 (*) 6 - 23 mg/dL   Creatinine, Ser 2.63 (*) 0.50 - 1.35 mg/dL   Calcium 7.9 (*) 8.4 - 10.5 mg/dL   GFR calc non Af Amer 23 (*) >90 mL/min   GFR calc Af Amer 27 (*) >90  mL/min   Comment: (NOTE)     The eGFR has been calculated using the CKD EPI equation.     This calculation has not been validated in all clinical situations.     eGFR's persistently <90 mL/min signify possible Chronic Kidney     Disease.  GLUCOSE, CAPILLARY     Status: Abnormal   Collection Time    07/02/13 12:14 PM      Result Value Range   Glucose-Capillary 166 (*) 70 - 99 mg/dL   Comment 1 Notify RN    GLUCOSE, CAPILLARY      Status: Abnormal   Collection Time    07/02/13  5:08 PM      Result Value Range   Glucose-Capillary 121 (*) 70 - 99 mg/dL   Comment 1 Documented in Chart     Comment 2 Notify RN      No results found.  Review of Systems  Constitutional: Positive for malaise/fatigue. Negative for fever.  Eyes: Negative for double vision, photophobia and pain.  Respiratory: Negative for cough, hemoptysis, sputum production and shortness of breath.   Cardiovascular: Positive for leg swelling. Negative for chest pain, palpitations, orthopnea and PND.  Gastrointestinal: Negative for nausea, vomiting and abdominal pain.  Genitourinary: Negative for dysuria and urgency.  Neurological: Positive for weakness. Negative for dizziness.   Blood pressure 130/51, pulse 84, temperature 98.1 F (36.7 C), temperature source Oral, resp. rate 20, height $RemoveBe'5\' 11"'vcFRcEXoz$  (1.803 m), weight 103.2 kg (227 lb 8.2 oz), SpO2 98.00%. Physical Exam  Constitutional: He is oriented to person, place, and time.  HENT:  Head: Normocephalic.  Eyes: Conjunctivae are normal. Left eye exhibits no discharge. No scleral icterus.  Neck: Normal range of motion. Neck supple. No JVD present. No tracheal deviation present. No thyromegaly present.  Cardiovascular: Normal rate and regular rhythm.   Murmur (soft systolic murmur noted no S3 gallop) heard. Respiratory:  Decreased breath sound at bases  GI: Soft. Bowel sounds are normal. He exhibits no distension. There is no tenderness. There is no rebound.  Musculoskeletal:  No clubbing cyanosis 2+ edema left leg and trace edema right leg surgical dressings noted  Neurological: He is alert and oriented to person, place, and time.    Assessment/Plan: Resolving osteomyelitis/cellulitis left leg status post incision and drainage Compensated congestive heart failure secondary to result systolic function Hypertension Insulin requiring diabetes mellitus Acute on chronic kidney disease stage IV  multifactorial i.e. Hypotension/antibiotics/hypoalbuminemia/intravascular volume depletion Anemia of chronic disease Malnutrition Deconditioning Decubitus ulcers Plan Agree with present management Elevate legs Reduce Coreg dose for now avoid hypotension Will give IV albumin short-term with Lasix IV 40 mg daily Hold by mouth Lasix for now Consider renal consult   Jeraline Marcinek N 07/02/2013, 7:34 PM

## 2013-07-02 NOTE — Progress Notes (Signed)
Spoke to Dr Lessie DingsWhitefield about heparin and coreg. He stated to hold the coreg and discontinue to heparin.

## 2013-07-02 NOTE — Progress Notes (Addendum)
[facility] PROGRESS NOTE  Justin Mierzwa Sr. ZOX:[id number] DOB: [birth year] DOA: 06/29/2013 PCP: [name], MD  Brief narrative: 68 y.o. male with past medical history of diabetes mellitus, hypertension, diastolic dysfunction, MRSA infection who presented to [facility] ED 06/29/2013 with left non heeling heel ulcer. On admission, vitals were stable. CBC revealed WBC count of 15.4, hemoglobin of 11.2. BMP revealed creatinine of 1.87. Left foot x ray revealed soft tissue wound over calcaneus but no bony abnormality. MRI of the left foot revealed cellulitic changes with large ulceration over the left heel, signal abnormality in the calcaneus consistent with osteomyelitis. Plan for surgery today per ortho recommendations.   Assessment/Plan:   Principal Problem:  Left foot osteomyelitis  - continue vanco and zosyn  - Wound: Left heel 6.7 cm x 7.5 cm, 100% eschar. Right malleolus measures 0.5 cm x 0.2 cm x 0.1 cm ; recommended to clean wound with NS and pat gently dry, apply Allevyn silicone border foam dressing and change every 3 days. Dry dressing in place at this time to left heel.  - appreciate orthopedic surgery consultation and recommendations; plan for surgery today   Active Problems:  Diabetes, with peripheral neuropathy  - A1c 9.7 on this admission; appreciate diabetic coordinator consult and recommendations  - CBG's in past 24 hours: 161, 234, 126 - continue insulin sliding scale; regular regimen with Lantus 8 units at bedtime  Chronic diastolic CHF  - based on last 2 D ECHO EF 55% (03/2013)  - BNP not checked on this admission  - seems compensated, not in respiratory distress  - on lasix and metolazone; decreased lasix to 40 mg instead of 80 mg due to risk of dehydration, pt not eating very well  - also on coreg 12.5 mg PO BID  - appreciated cardio consult; called Dr. Sharyn Lull Chronic kidney disease, stage 4 - creatinine trending up since admission, 1.87 --> 2.63; in 03/2013  creatinine as high as 3.79 - decreased the dose of home dose lasix from 80 mg to 40 mg due to borderline BP; BP this am 134/63 - will see if plan per cardio to hold off on lasix or to increase the dose back to what it was Anemia of chronic kidney disease - hemoglobin 11.2 --> 10.4 - no indications for transfusion Leukocytosis - secondary to osteomyelitis - on vanco and zosyn  Code Status: full code  Family Communication: wife at the bedside  Disposition Plan: remains inpatient   Consultants:  [facility] Cardio  Procedures:  I&D Antibiotics:  Vanco 06/29/2013 -->  Zosyn 06/29/2013 -->   [name], MD  [facility] Pager [phone]  If 7PM-7AM, please contact night-coverage [email or web address] Password [email or web address] 07/02/2013, 7:12 AM   LOS: 3 days    HPI/Subjective: No acute overnight events.   Objective: Filed Vitals:   07/01/13 1420 07/01/13 2110 07/01/13 2124 07/02/13 0600  BP: 117/66  122/58 140/69  Pulse: 90 98 94 88  Temp: 97.7 F (36.5 C)  98.5 F (36.9 C) 98.3 F (36.8 C)  TempSrc: Oral  Oral Oral  Resp: 18  18 16   Height:      Weight:    103.2 kg (227 lb 8.2 oz)  SpO2: 98%  99% 99%    Intake/Output Summary (Last 24 hours) at 07/02/13 8295 Last data filed at 07/02/13 0618  Gross per 24 hour  Intake   2788 ml  Output   1375 ml  Net   1413 ml    Exam:   General:  Pt is sleeping, not in acute distress  Cardiovascular: Regular rate and rhythm, S1/S2 appreciated  Respiratory: bibasilar crackles, no wheezing  Abdomen: Soft, non tender, non distended, bowel sounds present, no guarding  Extremities: left heel ulcer, also right ankle pressure ulcer, sacral decubitus stage I ulcer  Neuro: Grossly nonfocal  Data Reviewed: Basic Metabolic Panel:  Recent Labs Lab 06/29/13 1532 06/30/13 0405  NA 142 139  K 4.4 4.3  CL 103 102  CO2 24 23  GLUCOSE 218* 227*  BUN 46* 47*  CREATININE 1.87* 1.96*  CALCIUM 9.3 8.6   Liver Function  Tests:  Recent Labs Lab 06/29/13 1532 06/30/13 0405  AST 11 9  ALT <5 <5  ALKPHOS 92 67  BILITOT <0.2* 0.3  PROT 8.8* 7.3  ALBUMIN 2.9* 2.5*   No results found for this basename: LIPASE, AMYLASE,  in the last 168 hours No results found for this basename: AMMONIA,  in the last 168 hours CBC:  Recent Labs Lab 06/29/13 1532 06/30/13 0405  WBC 15.4* 11.7*  NEUTROABS 12.7*  --   HGB 11.2* 10.4*  HCT 33.7* 31.3*  MCV 83.8 83.0  PLT 348 302   Cardiac Enzymes: No results found for this basename: CKTOTAL, CKMB, CKMBINDEX, TROPONINI,  in the last 168 hours BNP: No components found with this basename: POCBNP,  CBG:  Recent Labs Lab 06/30/13 2151 07/01/13 0743 07/01/13 1145 07/01/13 1613 07/01/13 2123  GLUCAP 151* 155* 147* 161* 234*    CULTURE, BLOOD (ROUTINE X 2)     Status: None   Collection Time    06/29/13  9:14 PM      Result Value Range Status   Specimen Description BLOOD RIGHT HAND   Final   Value:        BLOOD CULTURE RECEIVED NO GROWTH TO DATE      Performed at [facility]   Report Status PENDING   Incomplete  CULTURE, BLOOD (ROUTINE X 2)     Status: None   Collection Time    06/29/13  9:25 PM      Result Value Range Status   Specimen Description BLOOD RIGHT HAND   Final   Value:        BLOOD CULTURE RECEIVED NO GROWTH TO DATE      Performed at [facility]   Report Status PENDING   Incomplete  URINE CULTURE     Status: None   Collection Time    06/29/13  9:38 PM      Result Value Range Status   Specimen Description URINE, CLEAN CATCH   Final   Value: Multiple bacterial morphotypes present, none predominant. Suggest appropriate recollection if clinically indicated.     Performed at [facility]   Report Status 07/01/2013 FINAL   Final  MRSA PCR SCREENING     Status: None   Collection Time    06/30/13  1:04 AM      Result Value Range Status   MRSA by PCR NEGATIVE  NEGATIVE Final  SURGICAL PCR SCREEN     Status: None    Collection Time    07/02/13  3:54 AM      Result Value Range Status   MRSA, PCR NEGATIVE  NEGATIVE Final     Studies: No results found.  Scheduled Meds: . carvedilol  12.5 mg Oral BID WC  . furosemide  40 mg Oral Daily  . heparin  5,000 Units Subcutaneous Q8H  . insulin aspart  0-15 Units Subcutaneous  TID WC  . insulin aspart  0-5 Units Subcutaneous QHS  . insulin glargine  8 Units Subcutaneous QHS  . metolazone  5 mg Oral Daily  . pantoprazole  40 mg Oral Daily  . piperacillin-tazobactam (ZOSYN)  IV  3.375 g Intravenous Q8H  . tamsulosin  0.4 mg Oral Daily  . vancomycin  1,500 mg Intravenous Q24H   Continuous Infusions:

## 2013-07-02 NOTE — Anesthesia Preprocedure Evaluation (Addendum)
Anesthesia Evaluation  Patient identified by MRN, date of birth, ID band Patient awake    Reviewed: Allergy & Precautions, H&P , NPO status , Patient's Chart, lab work & pertinent test results  Airway Mallampati: II  Neck ROM: Full    Dental  (+) Poor Dentition, Missing and Dental Advisory Given   Pulmonary pneumonia -, former smoker,  breath sounds clear to auscultation  Pulmonary exam normal       Cardiovascular hypertension, Pt. on medications and Pt. on home beta blockers + Peripheral Vascular Disease and +CHF Rhythm:Regular Rate:Normal     Neuro/Psych  Neuromuscular disease negative neurological ROS  negative psych ROS   GI/Hepatic negative GI ROS, Neg liver ROS,   Endo/Other  negative endocrine ROSdiabetes, Poorly Controlled, Type 2, Insulin Dependent  Renal/GU Renal disease  negative genitourinary   Musculoskeletal negative musculoskeletal ROS (+)   Abdominal   Peds  Hematology negative hematology ROS (+)   Anesthesia Other Findings Patient denied loose teeth at time of preoperative exam however at time of induction patient noted to have extremely loose upper front incisors prior to placement of LMA.  Reproductive/Obstetrics                          Anesthesia Physical Anesthesia Plan  ASA: III and emergent  Anesthesia Plan: General   Post-op Pain Management:    Induction: Intravenous  Airway Management Planned: LMA  Additional Equipment:   Intra-op Plan:   Post-operative Plan: Extubation in OR  Informed Consent: I have reviewed the patients History and Physical, chart, labs and discussed the procedure including the risks, benefits and alternatives for the proposed anesthesia with the patient or authorized representative who has indicated his/her understanding and acceptance.   Dental advisory given  Plan Discussed with: CRNA  Anesthesia Plan Comments:          Anesthesia Quick Evaluation

## 2013-07-03 ENCOUNTER — Encounter (HOSPITAL_COMMUNITY): Payer: Self-pay | Admitting: Orthopaedic Surgery

## 2013-07-03 LAB — CBC
HCT: 23.8 % — ABNORMAL LOW (ref 39.0–52.0)
HEMOGLOBIN: 7.7 g/dL — AB (ref 13.0–17.0)
MCH: 27 pg (ref 26.0–34.0)
MCHC: 32.4 g/dL (ref 30.0–36.0)
MCV: 83.5 fL (ref 78.0–100.0)
Platelets: 197 10*3/uL (ref 150–400)
RBC: 2.85 MIL/uL — AB (ref 4.22–5.81)
RDW: 14.4 % (ref 11.5–15.5)
WBC: 7.4 10*3/uL (ref 4.0–10.5)

## 2013-07-03 LAB — BASIC METABOLIC PANEL
BUN: 47 mg/dL — ABNORMAL HIGH (ref 6–23)
CHLORIDE: 104 meq/L (ref 96–112)
CO2: 24 meq/L (ref 19–32)
Calcium: 8 mg/dL — ABNORMAL LOW (ref 8.4–10.5)
Creatinine, Ser: 2.63 mg/dL — ABNORMAL HIGH (ref 0.50–1.35)
GFR calc Af Amer: 27 mL/min — ABNORMAL LOW (ref >90)
GFR calc non Af Amer: 23 mL/min — ABNORMAL LOW (ref >90)
GLUCOSE: 187 mg/dL — AB (ref 70–99)
POTASSIUM: 3.6 meq/L — AB (ref 3.7–5.3)
SODIUM: 141 meq/L (ref 137–147)

## 2013-07-03 LAB — GLUCOSE, CAPILLARY
GLUCOSE-CAPILLARY: 156 mg/dL — AB (ref 70–99)
GLUCOSE-CAPILLARY: 143 mg/dL — AB (ref 70–99)
Glucose-Capillary: 247 mg/dL — ABNORMAL HIGH (ref 70–99)
Glucose-Capillary: 169 mg/dL — ABNORMAL HIGH (ref 70–99)

## 2013-07-03 LAB — MAGNESIUM: Magnesium: 2.1 mg/dL (ref 1.5–2.5)

## 2013-07-03 LAB — VANCOMYCIN, TROUGH: Vancomycin Tr: 21.9 ug/mL — ABNORMAL HIGH (ref 10.0–20.0)

## 2013-07-03 MED ORDER — POTASSIUM CHLORIDE CRYS ER 20 MEQ PO TBCR
40.0000 meq | EXTENDED_RELEASE_TABLET | Freq: Once | ORAL | Status: AC
Start: 2013-07-03 — End: 2013-07-03
  Administered 2013-07-03: 40 meq via ORAL
  Filled 2013-07-03: qty 2

## 2013-07-03 MED ORDER — ALBUMIN HUMAN 25 % IV SOLN
25.0000 g | Freq: Once | INTRAVENOUS | Status: AC
  Administered 2013-07-03: 25 g via INTRAVENOUS
  Filled 2013-07-03: qty 100

## 2013-07-03 MED ORDER — POTASSIUM CHLORIDE CRYS ER 20 MEQ PO TBCR
20.0000 meq | EXTENDED_RELEASE_TABLET | Freq: Two times a day (BID) | ORAL | Status: DC
  Administered 2013-07-03 – 2013-07-05 (×5): 20 meq via ORAL
  Filled 2013-07-03 (×6): qty 1

## 2013-07-03 MED ORDER — INSULIN GLARGINE 100 UNIT/ML ~~LOC~~ SOLN
12.0000 [IU] | Freq: Every day | SUBCUTANEOUS | Status: DC
  Administered 2013-07-03 – 2013-07-04 (×2): 12 [IU] via SUBCUTANEOUS
  Filled 2013-07-03 (×3): qty 0.12

## 2013-07-03 MED ORDER — INSULIN ASPART 100 UNIT/ML ~~LOC~~ SOLN
4.0000 [IU] | Freq: Three times a day (TID) | SUBCUTANEOUS | Status: DC
  Administered 2013-07-03 – 2013-07-05 (×4): 4 [IU] via SUBCUTANEOUS

## 2013-07-03 NOTE — Progress Notes (Signed)
Subjective:  Patient denies any chest pain or shortness of breath. His leg swelling slightly improved  Objective:  Vital Signs in the last 24 hours: Temp:  [97.9 F (36.6 C)-101.2 F (38.4 C)] 98.6 F (37 C) (01/26 0610) Pulse Rate:  [84-99] 90 (01/26 0610) Resp:  [16-20] 16 (01/26 0610) BP: (113-146)/(51-74) 146/74 mmHg (01/26 0610) SpO2:  [97 %-100 %] 100 % (01/26 0610) Weight:  [103.329 kg (227 lb 12.8 oz)] 103.329 kg (227 lb 12.8 oz) (01/26 0640)  Intake/Output from previous day: 01/25 0701 - 01/26 0700 In: 1940 [P.O.:560; I.V.:750; IV Piggyback:250] Out: 3525 [Urine:3525] Intake/Output from this shift:    Physical Exam: Neck: no adenopathy, no carotid bruit, no JVD and supple, symmetrical, trachea midline Lungs: Decreased breath sound at bases Heart: regular rate and rhythm, S1, S2 normal and Soft systolic murmur noted Abdomen: soft, non-tender; bowel sounds normal; no masses,  no organomegaly Extremities: No clubbing cyanosis 2+ edema noted surgical dressing noted  Lab Results:  Recent Labs  07/03/13 0416  WBC 7.4  HGB 7.7*  PLT 197    Recent Labs  07/02/13 0930 07/03/13 0416  NA 140 141  K 4.1 3.6*  CL 106 104  CO2 19 24  GLUCOSE 136* 187*  BUN 50* 47*  CREATININE 2.63* 2.63*   No results found for this basename: TROPONINI, CK, MB,  in the last 72 hours Hepatic Function Panel No results found for this basename: PROT, ALBUMIN, AST, ALT, ALKPHOS, BILITOT, BILIDIR, IBILI,  in the last 72 hours No results found for this basename: CHOL,  in the last 72 hours No results found for this basename: PROTIME,  in the last 72 hours  Imaging: Imaging results have been reviewed and No results found.  Cardiac Studies:  Assessment Status post Irrigation and debridement  of skin, soft tissue, muscle,  and bone including partial calcaneus excision. Resolving osteomyelitis/cellulitis left leg status post incision and drainage  Compensated congestive heart failure  secondary to preserved systolic function  Hypertension  Insulin requiring diabetes mellitus  Acute on chronic kidney disease stage IV multifactorial i.e. Hypotension/antibiotics/hypoalbuminemia/intravascular volume depletion  Anemia of chronic disease  Malnutrition  Deconditioning  Decubitus ulcers  Plan Continue present management as per orders   LOS: 4 days    [name] 07/03/2013, 12:02 PM

## 2013-07-03 NOTE — Progress Notes (Addendum)
[facility] PROGRESS NOTE  Justin HansenDonald Bouse Sr. ZOX:[id number] DOB: 08/19/1945 DOA: 06/29/2013 PCP: Evlyn CourierHILL,Justin K, MD  Brief narrative: 68 y.o. male with past medical history of diabetes mellitus, hypertension, diastolic dysfunction, MRSA infection who [facility] with left non heeling heel ulcer. On admission, vitals were stable. CBC revealed WBC count of 15.4, hemoglobin of 11.2. BMP revealed creatinine of 1.87. Left foot x ray revealed soft tissue wound over calcaneus but no bony abnormality. MRI of the left foot revealed cellulitic changes with large ulceration over the left heel, signal abnormality in the calcaneus consistent with osteomyelitis. Pt underwent I&D 07/02/2013 by ortho.   Assessment/Plan:   Principal Problem:  Left foot osteomyelitis, abscess, cellulitis - status post I&D by orthopedic surgery (Dr. Magnus IvanBlackman) on 07/02/2013 - continue vanco and zosyn; please vanco not seen as ordered because it is being monitor, vanco level high so holding vanco - Wound: Left heel 6.7 cm x 7.5 cm, 100% eschar. Right malleolus measures 0.5 cm x 0.2 cm x 0.1 cm   Active Problems:  Diabetes, with peripheral neuropathy  - A1c 9.7 on this admission; appreciate diabetic coordinator consult and recommendations  - CBG's in past 24 hours: 132, 156, 143 - continue insulin sliding scale; regular regimen with Lantus increased to 12 units at bedtime; added Novolog 4 units TID Chronic diastolic CHF  - based on last 2 D ECHO EF 55% (03/2013)  - BNP not checked on this admission  - seems compensated, not in respiratory distress  - on lasix and metolazone; per cardio lasix is now 40 mg IV daily; continue to monitor renal function - also on coreg 12.5 mg PO BID  Chronic kidney disease, stage 4  - creatinine trending up since admission, 1.87 --> 2.63; in 03/2013 creatinine as high as 3.79  Anemia of chronic kidney disease  - hemoglobin 11.2 --> 10.4 --> 7.7 - also some postoperative  blood loss - pt declined transfusion Leukocytosis  - secondary to osteomyelitis  - on vanco and zosyn  - resolved  Hypokalemia - due to lasix - repleted  Code Status: full code  Family Communication: wife at the bedside  Disposition Plan: remains inpatient   Consultants:  [facility] Cardio Ortho  Procedures:  I&D  Antibiotics:  Vanco 06/29/2013 -->  Zosyn 06/29/2013 -->   Manson PasseyEVINE, [name], MD  [facility] Pager [phone]  If 7PM-7AM, please contact night-coverage [email or web address] Password [email or web address] 07/03/2013, 1:51 PM   LOS: 4 days    HPI/Subjective: No acute overnight events.  Objective: Filed Vitals:   07/02/13 2324 07/03/13 0610 07/03/13 0640 07/03/13 1321  BP: 113/63 146/74  113/56  Pulse: 96 90  85  Temp:  98.6 F (37 C)  98.5 F (36.9 C)  TempSrc:  Oral  Oral  Resp:  16  18  Height:      Weight:   103.329 kg (227 lb 12.8 oz)   SpO2:  100%  100%    Intake/Output Summary (Last 24 hours) at 07/03/13 1351 Last data filed at 07/03/13 1300  Gross per 24 hour  Intake   1670 ml  Output   3275 ml  Net  -1605 ml    Exam:   General:  Pt is alert, follows commands appropriately, not in acute distress  Cardiovascular: Regular rate and rhythm, S1/S2 appreciated   Respiratory: Clear to auscultation bilaterally, no wheezing, no crackles, no rhonchi  Abdomen: Soft, non tender, non distended, bowel sounds present, no guarding  Extremities: dressing in place on  left foot  Neuro: Grossly nonfocal  Data Reviewed: Basic Metabolic Panel:  Recent Labs Lab 06/29/13 1532 06/30/13 0405 07/02/13 0930 07/03/13 0416  NA 142 139 140 141  K 4.4 4.3 4.1 3.6*  CL 103 102 106 104  CO2 24 23 19 24   GLUCOSE 218* 227* 136* 187*  BUN 46* 47* 50* 47*  CREATININE 1.87* 1.96* 2.63* 2.63*  CALCIUM 9.3 8.6 7.9* 8.0*  MG  --   --   --  2.1   Liver Function Tests:  Recent Labs Lab 06/29/13 1532 06/30/13 0405  AST 11 9  ALT <5 <5  ALKPHOS 92 67  BILITOT <0.2* 0.3   PROT 8.8* 7.3  ALBUMIN 2.9* 2.5*   No results found for this basename: LIPASE, AMYLASE,  in the last 168 hours No results found for this basename: AMMONIA,  in the last 168 hours CBC:  Recent Labs Lab 06/29/13 1532 06/30/13 0405 07/03/13 0416  WBC 15.4* 11.7* 7.4  NEUTROABS 12.7*  --   --   HGB 11.2* 10.4* 7.7*  HCT 33.7* 31.3* 23.8*  MCV 83.8 83.0 83.5  PLT 348 302 197   Cardiac Enzymes: No results found for this basename: CKTOTAL, CKMB, CKMBINDEX, TROPONINI,  in the last 168 hours BNP: No components found with this basename: POCBNP,  CBG:  Recent Labs Lab 07/02/13 1214 07/02/13 1708 07/02/13 2127 07/03/13 0742 07/03/13 1133  GLUCAP 166* 121* 132* 156* 143*    Recent Results (from the past 240 hour(s))  CULTURE, BLOOD (ROUTINE X 2)     Status: None   Collection Time    06/29/13  9:14 PM      Result Value Range Status   Specimen Description BLOOD RIGHT HAND   Final   Special Requests BOTTLES DRAWN AEROBIC ONLY   Final   Culture  Setup Time     Final   Value: 06/30/2013 01:51     Performed at [facility]   Culture     Final   Value:        BLOOD CULTURE RECEIVED NO GROWTH TO DATE CULTURE WILL BE HELD FOR 5 DAYS BEFORE ISSUING A FINAL NEGATIVE REPORT     Performed at [facility]   Report Status PENDING   Incomplete  CULTURE, BLOOD (ROUTINE X 2)     Status: None   Collection Time    06/29/13  9:25 PM      Result Value Range Status   Specimen Description BLOOD RIGHT HAND   Final   Special Requests BOTTLES DRAWN AEROBIC ONLY   Final   Culture  Setup Time     Final   Value: 06/30/2013 01:51     Performed at [facility]   Culture     Final   Value:        BLOOD CULTURE RECEIVED NO GROWTH TO DATE CULTURE WILL BE HELD FOR 5 DAYS BEFORE ISSUING A FINAL NEGATIVE REPORT     Performed at [facility]   Report Status PENDING   Incomplete  URINE CULTURE     Status: None   Collection Time    06/29/13  9:38 PM      Result  Value Range Status   Specimen Description URINE, CLEAN CATCH   Final   Special Requests NONE   Final   Culture  Setup Time     Final   Value: 06/30/2013 03:46     Performed at [facility]  Colony Count     Final   Value: 50,000 COLONIES/ML     Performed at [facility]   Culture     Final   Value: Multiple bacterial morphotypes present, none predominant. Suggest appropriate recollection if clinically indicated.     Performed at [facility]   Report Status 07/01/2013 FINAL   Final  MRSA PCR SCREENING     Status: None   Collection Time    06/30/13  1:04 AM      Result Value Range Status   MRSA by PCR NEGATIVE  NEGATIVE Final   Comment:            The GeneXpert MRSA Assay (FDA     approved for NASAL specimens     only), is one component of a     comprehensive MRSA colonization     surveillance program. It is not     intended to diagnose MRSA     infection nor to guide or     monitor treatment for     MRSA infections.  SURGICAL PCR SCREEN     Status: None   Collection Time    07/02/13  3:54 AM      Result Value Range Status   MRSA, PCR NEGATIVE  NEGATIVE Final   Staphylococcus aureus NEGATIVE  NEGATIVE Final   Comment:            The Xpert SA Assay (FDA     approved for NASAL specimens     in patients over 52 years of age),     is one component of     a comprehensive surveillance     program.  Test performance has     been validated by [employer] for patients greater     than or equal to 40 year old.     It is not intended     to diagnose infection nor to     guide or monitor treatment.     Studies: No results found.  Scheduled Meds: . albumin human  25 g Intravenous Once  . carvedilol  6.25 mg Oral BID WC  . furosemide  40 mg Intravenous Daily  . insulin aspart  0-15 Units Subcutaneous TID WC  . insulin aspart  0-5 Units Subcutaneous QHS  . insulin aspart  4 Units Subcutaneous TID WC  . insulin glargine  12 Units Subcutaneous QHS   . metolazone  5 mg Oral Daily  . pantoprazole  40 mg Oral Daily  . piperacillin-tazobactam (ZOSYN)  IV  3.375 g Intravenous Q8H  . potassium chloride  40 mEq Oral Once  . tamsulosin  0.4 mg Oral Daily   Continuous Infusions:

## 2013-07-03 NOTE — Progress Notes (Signed)
Patient ID: Justin HansenDonald Cilia Sr., male   DOB: 09/14/1945, 68 y.o.   MRN: [id number] No acute changes overnight after surgery yesterday. He reports mild pain.  I was able to remove necrotic tissue and infected bone from his left heel as well as close the wound.  I spoke with his wife in length and she understands that he will need SNF placement to help with his care and rehabilitation.  From an Ortho standpoint, I have no other recs for now other than keeping pressure off of his left heel off at all times.  I will leave his current dressing on his left foot for the next 1-2 weeks to protect the wound and incision.  He does have acute on chronic blood loss anemia.

## 2013-07-03 NOTE — Progress Notes (Signed)
Vancomycin trough elevated at 33.1. Spoke to Delena BaliJ.C. Grimsley, [facility] since pharmacy dosing antibiotic.

## 2013-07-03 NOTE — Progress Notes (Signed)
ANTIBIOTIC CONSULT NOTE - FOLLOW UP  Pharmacy Consult for Vancomycin Indication: osteomyelitis/cellulitis left leg status post incision and drainage   Allergies  Allergen Reactions  . Ancef [Cefazolin] Itching and Rash    rash  . Sulfa Antibiotics Rash    Patient Measurements: Height: 5\' 11"  (180.3 cm) Weight: 227 lb 8.2 oz (103.2 kg) (with 3 pillows and a right boot) IBW/kg (Calculated) : 75.3 Adjusted Body Weight:   Vital Signs: Temp: 98.6 F (37 C) (01/26 0610) Temp src: Oral (01/26 0610) BP: 146/74 mmHg (01/26 0610) Pulse Rate: 90 (01/26 0610) Intake/Output from previous day: 01/25 0701 - 01/26 0700 In: 970 [P.O.:220; I.V.:750] Out: 2850 [Urine:2850] Intake/Output from this shift: Total I/O In: 220 [P.O.:220] Out: 1100 [Urine:1100]  Labs:  Recent Labs  07/02/13 0930 07/03/13 0416  WBC  --  7.4  HGB  --  7.7*  PLT  --  197  CREATININE 2.63* 2.63*   Estimated Creatinine Clearance: 32.9 ml/min (by C-G formula based on Cr of 2.63).  Recent Labs  07/02/13 2100  VANCOTROUGH 33.1*     Microbiology: Recent Results (from the past 720 hour(s))  CULTURE, BLOOD (ROUTINE X 2)     Status: None   Collection Time    06/29/13  9:14 PM      Result Value Range Status   Specimen Description BLOOD RIGHT HAND   Final   Special Requests BOTTLES DRAWN AEROBIC ONLY 2ML   Final   Culture  Setup Time     Final   Value: 06/30/2013 01:51     Performed at [facility]   Culture     Final   Value:        BLOOD CULTURE RECEIVED NO GROWTH TO DATE CULTURE WILL BE HELD FOR 5 DAYS BEFORE ISSUING A FINAL NEGATIVE REPORT     Performed at [facility]   Report Status PENDING   Incomplete  CULTURE, BLOOD (ROUTINE X 2)     Status: None   Collection Time    06/29/13  9:25 PM      Result Value Range Status   Specimen Description BLOOD RIGHT HAND   Final   Special Requests BOTTLES DRAWN AEROBIC ONLY 2ML   Final   Culture  Setup Time     Final   Value: 06/30/2013  01:51     Performed at [facility]   Culture     Final   Value:        BLOOD CULTURE RECEIVED NO GROWTH TO DATE CULTURE WILL BE HELD FOR 5 DAYS BEFORE ISSUING A FINAL NEGATIVE REPORT     Performed at [facility]   Report Status PENDING   Incomplete  URINE CULTURE     Status: None   Collection Time    06/29/13  9:38 PM      Result Value Range Status   Specimen Description URINE, CLEAN CATCH   Final   Special Requests NONE   Final   Culture  Setup Time     Final   Value: 06/30/2013 03:46     Performed at [facility]   Colony Count     Final   Value: 50,000 COLONIES/ML     Performed at [facility]   Culture     Final   Value: Multiple bacterial morphotypes present, none predominant. Suggest appropriate recollection if clinically indicated.     Performed at [facility]   Report Status 07/01/2013 FINAL   Final  MRSA PCR SCREENING     Status: None   Collection Time    06/30/13  1:04 AM      Result Value Range Status   MRSA by PCR NEGATIVE  NEGATIVE Final   Comment:            The GeneXpert MRSA Assay (FDA     approved for NASAL specimens     only), is one component of a     comprehensive MRSA colonization     surveillance program. It is not     intended to diagnose MRSA     infection nor to guide or     monitor treatment for     MRSA infections.  SURGICAL PCR SCREEN     Status: None   Collection Time    07/02/13  3:54 AM      Result Value Range Status   MRSA, PCR NEGATIVE  NEGATIVE Final   Staphylococcus aureus NEGATIVE  NEGATIVE Final   Comment:            The Xpert SA Assay (FDA     approved for NASAL specimens     in patients over 80 years of age),     is one component of     a comprehensive surveillance     program.  Test performance has     been validated by [employer] for patients greater     than or equal to 36 year old.     It is not intended     to diagnose infection nor to     guide or monitor treatment.     Anti-infectives   Start     Dose/Rate Route Frequency Ordered Stop   06/30/13 2200  vancomycin (VANCOCIN) 1,500 mg in sodium chloride 0.9 % 500 mL IVPB  Status:  Discontinued     1,500 mg 250 mL/hr over 120 Minutes Intravenous Every 24 hours 06/29/13 2218 07/02/13 1510   06/29/13 2300  piperacillin-tazobactam (ZOSYN) IVPB 3.375 g     3.375 g 12.5 mL/hr over 240 Minutes Intravenous Every 8 hours 06/29/13 2218     06/29/13 2230  vancomycin (VANCOCIN) IVPB 1000 mg/200 mL premix     1,000 mg 200 mL/hr over 60 Minutes Intravenous STAT 06/29/13 2218 06/30/13 0152   06/29/13 2100  piperacillin-tazobactam (ZOSYN) IVPB 3.375 g  Status:  Discontinued     3.375 g 100 mL/hr over 30 Minutes Intravenous  Once 06/29/13 2048 06/29/13 2218   06/29/13 2015  piperacillin-tazobactam (ZOSYN) IVPB 3.375 g  Status:  Discontinued     3.375 g 100 mL/hr over 30 Minutes Intravenous  Once 06/29/13 1951 06/29/13 2033   06/29/13 1730  vancomycin (VANCOCIN) IVPB 1000 mg/200 mL premix     1,000 mg 200 mL/hr over 60 Minutes Intravenous  Once 06/29/13 1705 06/29/13 2046      Assessment: Patient with high vancomycin level.  Current vancomycin dose d/c'd.   Goal of Therapy:  Vancomycin trough level 15-20 mcg/ml  Plan:  Measure antibiotic drug levels at steady state Follow up culture results Continue to hold vancomycin, recheck level at 2100 1/26  Darlina Guys, Jacquenette Shone Crowford 07/03/2013,6:26 AM

## 2013-07-03 NOTE — Progress Notes (Signed)
Clinical Social Work Department BRIEF PSYCHOSOCIAL ASSESSMENT 07/03/2013  Patient:  [name], [name]     Account Number:  [id number]     Admit date:  06/29/2013  Clinical Social Worker:  Ulyess Blossom  Date/Time:  07/03/2013 11:38 AM  Referred by:  Physician  Date Referred:  07/03/2013 Referred for  SNF Placement   Other Referral:   Interview type:  Patient Other interview type:    PSYCHOSOCIAL DATA Living Status:  WIFE Admitted from facility:   Level of care:   Primary support name:  [name] Steel/[phone] Primary support relationship to patient:  SPOUSE Degree of support available:   adequate    CURRENT CONCERNS Current Concerns  Post-Acute Placement   Other Concerns:    SOCIAL WORK ASSESSMENT / PLAN CSW received referral for New SNF placement.    CSW reviewed chart and met with pt at bedside to discuss. CSW introduced self and explained role. CSW discussed with pt MD recommendation for rehab at SNF in order to assist with pt care as pt will likely be on IV antibiotics and for rehab.    Pt withdrawn during discussion, but agreed to CSW exploring options in [facility].    CSW completed FL2 and initiated SNF search to [facility].    CSW to follow up with pt and pt wife if pt wife agrees for CSW to contact pt wife in regard to bed offers.    CSW to continue to follow and facilitate pt discharge needs when pt medically ready for discharge.   Assessment/plan status:  Psychosocial Support/Ongoing Assessment of Needs Other assessment/ plan:   discharge planning   Information/referral to community resources:   [facility] list    PATIENT'S/FAMILY'S RESPONSE TO PLAN OF CARE: Pt alert and oriented x 3, but per chart pt with memory impairment. Pt withdrawn during assessment and displayed poor eye contact. Pt agreeable to exploring SNF.    [name], MSW, [place] Work [phone]

## 2013-07-03 NOTE — Plan of Care (Signed)
Problem: Phase I Progression Outcomes Goal: Hemodynamically stable Outcome: Not Met (add Reason) Had fever of 101.2 last night. Encouraged patient to cough and deep breath. Given PRN tylenol. Spoke to NP on call. No new orders. Already on antibiotics.

## 2013-07-03 NOTE — ED Provider Notes (Signed)
Medical screening examination/treatment/procedure(s) were performed by non-physician practitioner and as supervising physician I was immediately available for consultation/collaboration.  EKG Interpretation    Date/Time:    Ventricular Rate:    PR Interval:    QRS Duration:   QT Interval:    QTC Calculation:   R Axis:     Text Interpretation:               Juliet RudeNathan R. Rubin PayorPickering, MD 07/03/13 [phone]

## 2013-07-03 NOTE — Progress Notes (Addendum)
Clinical Social Work Department CLINICAL SOCIAL WORK PLACEMENT NOTE 07/03/2013  Patient:  Justin HansenMARSHALL,[name]  Account Number:  [id number] Admit date:  06/29/2013  Clinical Social Worker:  Jacelyn GripSUZANNA BYRD, LCSWA  Date/time:  07/03/2013 12:20 PM  Clinical Social Work is seeking post-discharge placement for this patient at the following level of care:   SKILLED NURSING   (*CSW will update this form in Epic as items are completed)   07/03/2013  [facility]/family provided with [facility] of [facility] Work's list of facilities offering this level of care within the geographic area requested by the patient (or if unable, by the patient's family).  07/03/2013  Patient/family informed of their freedom to choose among providers that offer the needed level of care, that participate in Medicare, Medicaid or managed care program needed by the patient, have an available bed and are willing to accept the patient.  07/03/2013  Patient/family informed of [facility]' ownership interest in [facility], as well as of the fact that they are under no obligation to receive care at this facility.  PASARR submitted to EDS on 07/03/2013 PASARR number received from EDS on 07/03/2013  FL2 transmitted to all facilities in geographic area requested by pt/family on  07/03/2013 FL2 transmitted to all facilities within larger geographic area on   Patient informed that his/her managed care company has contracts with or will negotiate with  certain facilities, including the following:     Patient/family informed of bed offers received:  07/04/2013 [facility] Physician recommends and patient chooses bed at    Patient to be transferred [facility]  [facility] on 07/05/2013 Patient to be transferred to facility by ambulance Sharin Mons(PTAR)  The following physician request were entered in Epic:   Additional Comments:   Loletta SpecterSuzanna Kidd, MSW, LCSW Clinical  Social Work [phone]

## 2013-07-03 NOTE — Clinical Documentation Improvement (Signed)
PLEASE NOTE IF PRESENT ON ADMISSION  WOC wound consult note  06/30/13 , please indicate clarification of pressure ulcer  stage Possible Clinical Conditions?   Stage  I  Pressure Ulcer   (reddening of the skin) Stage  II Pressure Ulcer  (blister open or unopened) Stage  III Pressure Ulcer (through all layers skin) Stage IV Pressure Ulcer   (through skin & underlying  muscle, tendons, and bones) Other Condition Cannot Clinically Determine    Wound Nurse Assessment (WOC): 06/30/13   Treatment: INCISION AND DRAINAGE LEFT FOOT  07/02/13 sharp excisional debridement of skin, soft tissue, muscle,  and bone including partial calcaneus excision   Thank You, Andy GaussGarnet C Deepika Decatur ,RN Clinical Documentation Specialist:  [phone]  [facility]- Health Information Management

## 2013-07-03 NOTE — Progress Notes (Signed)
Inpatient Diabetes Program Recommendations  AACE/ADA: New Consensus Statement on Inpatient Glycemic Control (2013)  Target Ranges:  Prepandial:   less than 140 mg/dL      Peak postprandial:   less than 180 mg/dL (1-2 hours)      Critically ill patients:  140 - 180 mg/dL   Reason for Visit: Diabetes Consult  Diabetes history: Type 2 DM Outpatient Diabetes medications: Novolog 5-22 units tidwc Current orders for Inpatient glycemic control: Lantus 8 units QHS and Novolog moderate tidwc and hs  Inpatient Diabetes Program Recommendations Insulin - Basal: Increase Lantus to 12 units QHS Insulin - Meal Coverage: Add Novolog 4 units tidwc for meal coverage insulin if pt eats >50% meal HgbA1C: 9.7% - uncontrolled DM - will need adjustments in insulin prior to discharge  Note: Will need to f/u with PCP for management of diabetes.  Will likely be discharged to SNF? Will continue to follow while inpatient.  Thank you. Ailene Ardshonda Rito Lecomte, RD, LDN, CDE Inpatient Diabetes Coordinator [phone]

## 2013-07-04 ENCOUNTER — Encounter (HOSPITAL_COMMUNITY): Payer: Self-pay | Admitting: Radiology

## 2013-07-04 DIAGNOSIS — E1159 Type 2 diabetes mellitus with other circulatory complications: Secondary | ICD-10-CM

## 2013-07-04 LAB — COMPREHENSIVE METABOLIC PANEL
ALBUMIN: 2.7 g/dL — AB (ref 3.5–5.2)
ALK PHOS: 81 U/L (ref 39–117)
ALT: 7 U/L (ref 0–53)
AST: 13 U/L (ref 0–37)
BUN: 41 mg/dL — AB (ref 6–23)
CALCIUM: 8.2 mg/dL — AB (ref 8.4–10.5)
CO2: 24 mEq/L (ref 19–32)
Chloride: 108 mEq/L (ref 96–112)
Creatinine, Ser: 2.59 mg/dL — ABNORMAL HIGH (ref 0.50–1.35)
GFR calc Af Amer: 28 mL/min — ABNORMAL LOW (ref >90)
GFR calc non Af Amer: 24 mL/min — ABNORMAL LOW (ref >90)
Glucose, Bld: 150 mg/dL — ABNORMAL HIGH (ref 70–99)
Potassium: 4.4 mEq/L (ref 3.7–5.3)
SODIUM: 146 meq/L (ref 137–147)
TOTAL PROTEIN: 6.7 g/dL (ref 6.0–8.3)
Total Bilirubin: 0.3 mg/dL (ref 0.3–1.2)

## 2013-07-04 LAB — GLUCOSE, CAPILLARY
GLUCOSE-CAPILLARY: 154 mg/dL — AB (ref 70–99)
Glucose-Capillary: 153 mg/dL — ABNORMAL HIGH (ref 70–99)
Glucose-Capillary: 245 mg/dL — ABNORMAL HIGH (ref 70–99)
Glucose-Capillary: 182 mg/dL — ABNORMAL HIGH (ref 70–99)

## 2013-07-04 LAB — CBC
HEMATOCRIT: 25.1 % — AB (ref 39.0–52.0)
Hemoglobin: 8.3 g/dL — ABNORMAL LOW (ref 13.0–17.0)
MCH: 27.5 pg (ref 26.0–34.0)
MCHC: 33.1 g/dL (ref 30.0–36.0)
MCV: 83.1 fL (ref 78.0–100.0)
Platelets: 205 10*3/uL (ref 150–400)
RBC: 3.02 MIL/uL — ABNORMAL LOW (ref 4.22–5.81)
RDW: 14.5 % (ref 11.5–15.5)
WBC: 7.3 10*3/uL (ref 4.0–10.5)

## 2013-07-04 MED ORDER — INSULIN ASPART 100 UNIT/ML ~~LOC~~ SOLN: 0.0000 [IU] | Freq: Every day | SUBCUTANEOUS | Status: DC

## 2013-07-04 MED ORDER — ONDANSETRON HCL 4 MG PO TABS: 4.0000 mg | ORAL_TABLET | Freq: Four times a day (QID) | ORAL | Status: DC | PRN

## 2013-07-04 MED ORDER — METOLAZONE 5 MG PO TABS
5.0000 mg | ORAL_TABLET | ORAL | Status: DC
  Administered 2013-07-05: 5 mg via ORAL
  Filled 2013-07-04 (×2): qty 1

## 2013-07-04 MED ORDER — POTASSIUM CHLORIDE CRYS ER 20 MEQ PO TBCR: 20.0000 meq | EXTENDED_RELEASE_TABLET | Freq: Every day | ORAL | Status: DC

## 2013-07-04 MED ORDER — INSULIN ASPART 100 UNIT/ML ~~LOC~~ SOLN: 0.0000 [IU] | Freq: Three times a day (TID) | SUBCUTANEOUS | Status: DC

## 2013-07-04 MED ORDER — VANCOMYCIN HCL 10 G IV SOLR
1500.0000 mg | Freq: Once | INTRAVENOUS | Status: AC
  Administered 2013-07-04: 1500 mg via INTRAVENOUS
  Filled 2013-07-04 (×2): qty 1500

## 2013-07-04 MED ORDER — VANCOMYCIN HCL 10 G IV SOLR: 1500.0000 mg | INTRAVENOUS | Status: DC

## 2013-07-04 MED ORDER — INSULIN GLARGINE 100 UNIT/ML ~~LOC~~ SOLN: 12.0000 [IU] | Freq: Every day | SUBCUTANEOUS | Status: DC

## 2013-07-04 MED ORDER — LEVOFLOXACIN 750 MG PO TABS
750.0000 mg | ORAL_TABLET | ORAL | Status: DC
Start: 2013-07-04 — End: 2013-07-21

## 2013-07-04 MED ORDER — HYDROCODONE-ACETAMINOPHEN 5-325 MG PO TABS: 1.0000 | ORAL_TABLET | ORAL | Status: DC | PRN

## 2013-07-04 MED ORDER — INSULIN ASPART 100 UNIT/ML ~~LOC~~ SOLN
4.0000 [IU] | Freq: Three times a day (TID) | SUBCUTANEOUS | Status: DC
Start: 2013-07-04 — End: 2013-07-05

## 2013-07-04 NOTE — Progress Notes (Signed)
Pt for [facility].  CSW facilitated pt discharge needs including contacting facility, faxing pt discharge information via [facility], discussing with pt at bedside and pt wife via telephone, providing RN phone number to call report, and arranging ambulance transport for [facility].  No further social work needs identified at this time.  CSW signing off.   Loletta SpecterSuzanna Kidd, MSW, LCSW Clinical Social Work [phone]

## 2013-07-04 NOTE — Progress Notes (Signed)
Subjective:  Patient denies any chest pain or shortness of breath . States feels better today leg swelling and pain improved  Objective:  Vital Signs in the last 24 hours: Temp:  [97.2 F (36.2 C)-98.5 F (36.9 C)] 98.3 F (36.8 C) (01/27 0549) Pulse Rate:  [78-91] 86 (01/27 0549) Resp:  [18] 18 (01/27 0549) BP: (113-134)/(50-81) 134/58 mmHg (01/27 0549) SpO2:  [99 %-100 %] 99 % (01/27 0549)  Intake/Output from previous day: 01/26 0701 - 01/27 0700 In: 480 [P.O.:480] Out: 2100 [Urine:2100] Intake/Output from this shift:    Physical Exam: Neck: no adenopathy, no carotid bruit, no JVD and supple, symmetrical, trachea midline Lungs: Decreased breath sound at bases Heart: regular rate and rhythm, S1, S2 normal and Soft systolic murmur noted Abdomen: soft, non-tender; bowel sounds normal; no masses,  no organomegaly Extremities: No clubbing cyanosis 1+ edema left leg surgical dressing noted  Lab Results:  Recent Labs  07/03/13 0416 07/04/13 0358  WBC 7.4 7.3  HGB 7.7* 8.3*  PLT 197 205    Recent Labs  07/03/13 0416 07/04/13 0358  NA 141 146  K 3.6* 4.4  CL 104 108  CO2 24 24  GLUCOSE 187* 150*  BUN 47* 41*  CREATININE 2.63* 2.59*   No results found for this basename: TROPONINI, CK, MB,  in the last 72 hours Hepatic Function Panel  Recent Labs  07/04/13 0358  PROT 6.7  ALBUMIN 2.7*  AST 13  ALT 7  ALKPHOS 81  BILITOT 0.3   No results found for this basename: CHOL,  in the last 72 hours No results found for this basename: PROTIME,  in the last 72 hours  Imaging: Imaging results have been reviewed and No results found.  Cardiac Studies:  Assessment/Plan:  Status post Irrigation and debridement of skin, soft tissue, muscle,  and bone including partial calcaneus excision.  Resolving osteomyelitis/cellulitis left leg status post incision and drainage  Compensated congestive heart failure secondary to preserved systolic function  Hypertension  Insulin  requiring diabetes mellitus  Acute on chronic kidney disease stage IV multifactorial i.e. Hypotension/antibiotics/hypoalbuminemia/intravascular volume depletion  Anemia of chronic disease  Malnutrition  Deconditioning  Decubitus ulcers  Plan Continue present management I will sign off please call if needed  LOS: 5 days    Ellene Bloodsaw N 07/04/2013, 9:15 AM

## 2013-07-04 NOTE — Evaluation (Signed)
Physical Therapy Evaluation Patient Details Name: Justin HansenDonald Zirkelbach Sr. MRN: [id number] DOB: 05/17/1946 Today's Date: 07/04/2013 Time: [id number] PT Time Calculation (min): 35 min  PT Assessment / Plan / Recommendation History of Present Illness  Justin HansenDonald Lubke Sr. is a 68 y.o. male with Past medical history of diabetes mellitus, hypertension, diastolic dysfunction, MRSA infection., Infection of L heel blister. s/P I/D L heel on 07/02/13.  Clinical Impression  [facility] but unable to get weight on  Single R leg. Pt will benefit from PT to address problems listed.    PT Assessment  Patient needs continued PT services    Follow Up Recommendations  SNF    Does the patient have the potential to tolerate intense rehabilitation      Barriers to Discharge Decreased caregiver support      Equipment Recommendations  None recommended by PT    Recommendations for Other Services     Frequency Min 3X/week    Precautions / Restrictions Precautions Precautions: Fall Restrictions Weight Bearing Restrictions: Yes LLE Weight Bearing: Non weight bearing Other Position/Activity Restrictions: presumed L hel post op- n orders in chart.   Pertinent Vitals/Pain C/o pain when some weight was placed on L leg.      Mobility  Bed Mobility Overal bed mobility: +2 for physical assistance General bed mobility comments: bed pad used to slide pt  to edge, HOB raised to facilitate sitting up. assisted both legs onto bed. Transfers Overall transfer level: Needs assistance Equipment used: Rolling walker (2 wheeled) General transfer comment: Attemped to stand from bed x 3 but pt unable to prevent weight on L foot and was oainful.    Exercises     PT Diagnosis: Difficulty walking;Generalized weakness;Acute pain  PT Problem List: Decreased strength;Decreased activity tolerance;Decreased mobility;Pain PT Treatment Interventions: DME instruction;Functional mobility training;Therapeutic  activities;Therapeutic exercise;Patient/family education     PT Goals(Current goals can be found in the care plan section) Acute Rehab PT Goals Patient Stated Goal: I want to get in that chair PT Goal Formulation: With patient Time For Goal Achievement: 07/18/13 Potential to Achieve Goals: Fair  Visit Information  Last PT Received On: 07/04/13 History of Present Illness: Justin HansenDonald Brockway Sr. is a 68 y.o. male with Past medical history of diabetes mellitus, hypertension, diastolic dysfunction, MRSA infection., Infection of L heel blister. s/P I/D L heel on 07/02/13.       Prior Functioning  Home Living Family/patient expects to be discharged to:: Skilled nursing facility    Cognition  Cognition Arousal/Alertness: Awake/alert Behavior During Therapy: WFL for tasks assessed/performed Overall Cognitive Status: Within Functional Limits for tasks assessed    Extremity/Trunk Assessment Upper Extremity Assessment Upper Extremity Assessment: Generalized weakness Lower Extremity Assessment Lower Extremity Assessment: LLE deficits/detail;RLE deficits/detail RLE Deficits / Details: Strength not sufficient to stand on single leg RLE Sensation: history of peripheral neuropathy LLE Deficits / Details: dressing intact L foot, noted edema.  LLE Sensation: history of peripheral neuropathy   Balance Balance Overall balance assessment: Needs assistance Sitting-balance support: No upper extremity supported;Feet supported Sitting balance-Leahy Scale: Fair  End of Session PT - End of Session Equipment Utilized During Treatment: Gait belt Activity Tolerance: Patient limited by fatigue;Patient limited by pain Patient left: in bed;with call bell/phone within reach Nurse Communication: Mobility status;Need for lift equipment  GP     Rada HayHill, Aarsh Fristoe Elizabeth 07/04/2013, 11:51 AM

## 2013-07-04 NOTE — Progress Notes (Signed)
2 Days Post-Op  Subjective: Pt admitted and being treated for L foot abscess/cellulitis Plan for dc to SNF soon Needs IV antibiotics for few weeks at outside facility IV therapy unsuccessful--could not thread guidewire Last access in IR was 10/13- Rt IJ tunneled central catheter. Pt has CKD; Cr 2.6 today  Objective: Vital signs in last 24 hours: Temp:  [98 F (36.7 C)-98.3 F (36.8 C)] 98.3 F (36.8 C) (01/27 0549) Pulse Rate:  [86-91] 86 (01/27 0549) Resp:  [18] 18 (01/27 0549) BP: (132-134)/(58-81) 134/58 mmHg (01/27 0549) SpO2:  [99 %] 99 % (01/27 0549) Last BM Date: 07/03/13  Intake/Output from previous day: 01/26 0701 - 01/27 0700 In: 480 [P.O.:480] Out: 2100 [Urine:2100] Intake/Output this shift: Total I/O In: 600 [P.O.:600] Out: 350 [Urine:350]  Afeb; vss In nad A/O; restful Existing peripheral IV in use  Lab Results:   Recent Labs  07/03/13 0416 07/04/13 0358  WBC 7.4 7.3  HGB 7.7* 8.3*  HCT 23.8* 25.1*  PLT 197 205   BMET  Recent Labs  07/03/13 0416 07/04/13 0358  NA 141 146  K 3.6* 4.4  CL 104 108  CO2 24 24  GLUCOSE 187* 150*  BUN 47* 41*  CREATININE 2.63* 2.59*  CALCIUM 8.0* 8.2*   PT/INR No results found for this basename: LABPROT, INR,  in the last 72 hours ABG No results found for this basename: PHART, PCO2, PO2, HCO3,  in the last 72 hours  Studies/Results: No results found.  Anti-infectives: Anti-infectives   Start     Dose/Rate Route Frequency Ordered Stop   07/04/13 0900  vancomycin (VANCOCIN) 1,500 mg in sodium chloride 0.9 % 500 mL IVPB     1,500 mg 250 mL/hr over 120 Minutes Intravenous  Once 07/04/13 0126 07/04/13 1246   07/04/13 0000  levofloxacin (LEVAQUIN) 750 MG tablet     750 mg Oral Every 48 hours 07/04/13 1307     07/04/13 0000  sodium chloride 0.9 % SOLN 250 mL with vancomycin 10 G SOLR 1,500 mg    Comments:  Please note that even though the prescription says daily vancomycin it may not be given every day  depending on Vanco level   1,500 mg 125 mL/hr over 120 Minutes Intravenous Every 24 hours 07/04/13 1307     06/30/13 2200  vancomycin (VANCOCIN) 1,500 mg in sodium chloride 0.9 % 500 mL IVPB  Status:  Discontinued     1,500 mg 250 mL/hr over 120 Minutes Intravenous Every 24 hours 06/29/13 2218 07/02/13 1510   06/29/13 2300  piperacillin-tazobactam (ZOSYN) IVPB 3.375 g     3.375 g 12.5 mL/hr over 240 Minutes Intravenous Every 8 hours 06/29/13 2218     06/29/13 2230  vancomycin (VANCOCIN) IVPB 1000 mg/200 mL premix     1,000 mg 200 mL/hr over 60 Minutes Intravenous STAT 06/29/13 2218 06/30/13 0152   06/29/13 2100  piperacillin-tazobactam (ZOSYN) IVPB 3.375 g  Status:  Discontinued     3.375 g 100 mL/hr over 30 Minutes Intravenous  Once 06/29/13 2048 06/29/13 2218   06/29/13 2015  piperacillin-tazobactam (ZOSYN) IVPB 3.375 g  Status:  Discontinued     3.375 g 100 mL/hr over 30 Minutes Intravenous  Once 06/29/13 1951 06/29/13 2033   06/29/13 1730  vancomycin (VANCOCIN) IVPB 1000 mg/200 mL premix     1,000 mg 200 mL/hr over 60 Minutes Intravenous  Once 06/29/13 1705 06/29/13 2046      Assessment/Plan: s/p Procedure(s): INCISION AND DRAINAGE LEFT FOOT (Left)  Pt  scheduled for IJ tunneled central catheter placement in IR Aware of procedure benefits and risks and agreeable to proceed Consent signed and in chart Plan for SNF asap    LOS: 5 days    Leila Schuff A 07/04/2013

## 2013-07-04 NOTE — Progress Notes (Signed)
Peripherally Inserted Central Catheter/Midline Placement  The IV Nurse has discussed with the patient and/or persons authorized to consent for the patient, the purpose of this procedure and the potential benefits and risks involved with this procedure.  The benefits include less needle sticks, lab draws from the catheter and patient may be discharged home with the catheter.  Risks include, but not limited to, infection, bleeding, blood clot (thrombus formation), and puncture of an artery; nerve damage and irregular heat beat.  Alternatives to this procedure were also discussed.  PICC/Midline Placement Documentation        Lisabeth DevoidGibbs, Joycelynn Fritsche Jeanette 07/04/2013, 2:26 PM

## 2013-07-04 NOTE — Progress Notes (Signed)
CSW continuing to follow for disposition planning.  CSW met with pt at bedside to discuss SNF bed offers. Pt discussed that he prefers to have a private room at [facility]. Pt asked CSW to contact pt wife while present at bedside for pt wife to assist with decision making.   CSW contacted pt wife via telephone. Pt wife discussed that she is sick with possibly the flu, so she is unable to come to the hospital at this time. Pt wife discussed that pt and pt family preference is definitely for a private room. Pt wife asked CSW contact [facility], and [facility] to determine private room availability.   CSW contacted above facilities and [facility] and [facility] have private room availability. CSW met with pt at bedside and spoke with pt wife via telephone at bedside. CSW provided information about private room availability.   Pt and pt wife choose private room at [facility].  CSW notified the facility of acceptance of bed offer.  CSW spoke with MD who stated that pt potential for d/c today vs tomorrow and will depend on time that PICC line is placed for IV antibiotics today.  CSW notified [facility] and facility can accept pt today or tomorrow.  CSW notified pt and pt wife that there is a potential for discharge today depending on time that PICC line is placed.   Pt and pt wife expressed understanding.  CSW to continue to follow and facilitate pt discharge needs when pt medically ready for discharge.  Alison Murray, MSW, [place] [employer] [phone]

## 2013-07-04 NOTE — Discharge Summary (Addendum)
Physician Discharge Summary  Justin HansenDonald Winebarger Sr. ZOX:[id number] DOB: 04/04/1946 DOA: 06/29/2013  PCP: Evlyn CourierHILL,[name], [name]  Admit date: 06/29/2013 Discharge date: 07/05/2013  Recommendations for Outpatient Follow-up:  1. please note that the patient will have to be on Vancomycin and Levaquin.  2. please note that patient's vancomycin level was 21 on 07/03/2013. Goal should be 15-20. He was given a dose of vancomycin 1500 mg 07/04/2013. Please recheck vanco level tomorrow 07/05/2013 and adjsut the vancomycin dosage accordingly 3. Levaquin is 750 mg every 48 hours for 6 weeks so total of 21 doses. 4. Continue to monitor renal function per nursing home protocol, creatinine at the time of discharge is 2.59 5. Patient is also on Lasix 80 mg daily for CHF 6. Please continue potassium supplementation daily 7. Hemoglobin was 8.3 at the time of discharge. On 07/03/2013 hemoglobin was 7.7 but patient declined blood transfusion 8. Please note that the insulin regimen, 12 units Lantus at bedtime and NovoLog 4 units 3 times a day with meals in addition to sliding scale insulin 9.  Patient needs to see orthopedic surgery in one to 2 weeks after discharge to evaluate the incision site   Discharge Diagnoses:  Principal Problem:   Cellulitis and abscess of foot Active Problems:   DIABETES MELLITUS, WITH VASCULAR COMPLICATIONS   diastolic CHF   Physical deconditioning   Diabetic peripheral neuropathy   Hypertension   Discharge Condition: medically stable for discharge to skilled nursing facility  Diet recommendation: As tolerated, diabetic diet and heart healthy  History of present illness:  68 y.o. male with past medical history of diabetes mellitus, hypertension, diastolic dysfunction, MRSA infection who [facility] with left non heeling heel ulcer. On admission, vitals were stable. CBC revealed WBC count of 15.4, hemoglobin of 11.2. BMP revealed creatinine of 1.87. Left foot x ray  revealed soft tissue wound over calcaneus but no bony abnormality. MRI of the left foot revealed cellulitic changes with large ulceration over the left heel, signal abnormality in the calcaneus consistent with osteomyelitis. Pt underwent I&D 07/02/2013 by ortho.   Assessment/Plan:  Principal Problem:  Left foot osteomyelitis, abscess, cellulitis  - status post I&D by orthopedic surgery (Dr. Magnus IvanBlackman) on 07/02/2013  - continue vanco and Levaquin for total of 6 weeks; please note above vancomycin dosage. Vancomycin level needs to be checked daily and the goal is 15-20. Last vancomycin given 07/04/2013, 1500 mg once. Vancomycin needs to be dosed to reflect goal range 15-20 - Wound: Left heel 6.7 cm x 7.5 cm, 100% eschar. Right malleolus measures 0.5 cm x 0.2 cm x 0.1 cm  Active Problems:  Diabetes, with peripheral neuropathy  - A1c 9.7 on this admission; appreciate diabetic coordinator consult and recommendations  - continue insulin sliding scale; regular regimen with Lantus increased to 12 units at bedtime; added Novolog 4 units TID  Chronic diastolic CHF  - based on last 2 D ECHO EF 55% (03/2013)  - BNP not checked on this admission  - seems compensated, not in respiratory distress  - on lasix and metolazone; per cardio lasix is was increased to 40 mg IV daily. Patient will continue same home Lasix regimen on discharge - also on coreg 12.5 mg PO BID  Chronic kidney disease, stage 4  - creatinine trending up since admission, 1.87 --> 2.63 but now stable and trending down to 2.59 ; in 03/2013 creatinine as high as 3.79  Anemia of chronic kidney disease  - hemoglobin 11.2 --> 10.4 -->  7.7 --> 8.3 - also some postoperative blood loss  - pt declined transfusion  Leukocytosis  - secondary to osteomyelitis  - resolved  Hypokalemia  - due to lasix  - repleted   Code Status: full code  Family Communication: wife at the bedside    Consultants:  Cardiology Orthopedic surgery Procedures:   Incision and drainage 07/02/2013 Antibiotics:  Vanco 06/29/2013 -->  Zosyn 06/29/2013 -->  Signed:  RAMA,Justin, [name]  [facility] 07/05/2013, 9:51 AM  Pager #: [phone]    Discharge Exam: Filed Vitals:   07/05/13 0446  BP: 127/63  Pulse: 98  Temp: 99 F (37.2 C)  Resp: 18   Filed Vitals:   07/04/13 0549 07/04/13 1445 07/04/13 2104 07/05/13 0446  BP: 134/58 128/64 127/63 127/63  Pulse: 86 98 103 98  Temp: 98.3 F (36.8 C) 97.4 F (36.3 C) 98.3 F (36.8 C) 99 F (37.2 C)  TempSrc: Oral Oral Oral Oral  Resp: 18 18 18 18   Height:      Weight:    101.8 kg (224 lb 6.9 oz)  SpO2: 99% 100% 98% 97%    General: Pt is alert, follows commands appropriately, not in acute distress Cardiovascular: Regular rate and rhythm, S1/S2 appreciated  Respiratory: Clear to auscultation bilaterally, no wheezing, no crackles, no rhonchi Abdominal: Soft, non tender, non distended, bowel sounds +, no guarding Extremities: Left foot dressing in place, no cyanosis, pulses palpable bilaterally DP and PT Neuro: Grossly nonfocal  Discharge Instructions  Discharge Orders   Future Orders Complete By Expires   Call [name] for:  difficulty breathing, headache or visual disturbances  As directed    Call [name] for:  persistant dizziness or light-headedness  As directed    Call [name] for:  persistant nausea and vomiting  As directed    Call [name] for:  severe uncontrolled pain  As directed    Diet - low sodium heart healthy  As directed    Discharge instructions  As directed    Comments:     1. please note that the patient will have to be on Vancomycin and Levaquin.  2. please note that patient's vancomycin level was 21 on 07/03/2013. Goal should be 15-20. He was given a dose of vancomycin 1500 mg 07/04/2013. Please recheck vanco level tomorrow 07/05/2013 and adjsut the vancomycin dosage accordingly 3. Levaquin is 750 mg every 48 hours for 6 weeks so total of 21 doses. 4. Continue to monitor renal  function per nursing home protocol, creatinine at the time of discharge is 2.59 5. Patient is also on Lasix 80 mg daily for CHF 6. Please continue potassium supplementation daily 7. Hemoglobin was 8.3 at the time of discharge. On 07/03/2013 hemoglobin was 7.7 but patient declined blood transfusion 8. Please note that the insulin regimen, 12 units Lantus at bedtime and NovoLog 4 units 3 times a day with meals in addition to sliding scale insulin   Increase activity slowly  As directed        Medication List    STOP taking these medications       clindamycin 300 MG capsule  Commonly known as:  CLEOCIN      TAKE these medications       carvedilol 12.5 MG tablet  Commonly known as:  COREG  Take 1 tablet (12.5 mg total) by mouth 2 (two) times daily with a meal.     CENTRUM SILVER ADULT 50+ PO  Take 0.5 tablets by mouth daily.  furosemide 80 MG tablet  Commonly known as:  LASIX  Take 80 mg by mouth daily.     HYDROcodone-acetaminophen 5-325 MG per tablet  Commonly known as:  NORCO/VICODIN  Take 1-2 tablets by mouth every 4 (four) hours as needed.     insulin aspart 100 UNIT/ML injection  Commonly known as:  novoLOG  Inject 4 Units into the skin 3 (three) times daily with meals.     insulin aspart 100 UNIT/ML injection  Commonly known as:  novoLOG  Inject 0-15 Units into the skin 3 (three) times daily with meals.     insulin aspart 100 UNIT/ML injection  Commonly known as:  novoLOG  Inject 0-5 Units into the skin at bedtime.     insulin glargine 100 UNIT/ML injection  Commonly known as:  LANTUS  Inject 0.12 mLs (12 Units total) into the skin at bedtime.     levofloxacin 750 MG tablet  Commonly known as:  LEVAQUIN  Take 1 tablet (750 mg total) by mouth every other day.     metolazone 5 MG tablet  Commonly known as:  ZAROXOLYN  Take 1 tablet (5 mg total) by mouth daily.     MUSCLE RUB 10-15 % Crea  Apply 1 application topically as needed (neck and shoulder pain).      ondansetron 4 MG tablet  Commonly known as:  ZOFRAN  Take 1 tablet (4 mg total) by mouth every 6 (six) hours as needed for nausea.     pantoprazole 40 MG tablet  Commonly known as:  PROTONIX  Take 1 tablet (40 mg total) by mouth daily.     potassium chloride SA 20 MEQ tablet  Commonly known as:  [name]-DUR,KLOR-CON  Take 1 tablet (20 mEq total) by mouth daily.     sodium chloride 0.9 % SOLN 250 mL with vancomycin 10 G SOLR 1,500 mg  Inject 1,500 mg into the vein daily.     tamsulosin 0.4 MG Caps capsule  Commonly known as:  FLOMAX  Take 1 capsule (0.4 mg total) by mouth daily.     triamcinolone cream 0.1 %  Commonly known as:  KENALOG  Apply 1 application topically 3 (three) times daily. On back and bottom       Follow-up Information   Follow up with [name],[name], [name]. Schedule an appointment as soon as possible for a visit in 2 weeks.   Specialty:  Family Medicine   Contact information:   [address] [place] [address] [phone]        The results of significant diagnostics from this hospitalization (including imaging, microbiology, ancillary and laboratory) are listed below for reference.    Significant Diagnostic Studies: Mr Foot Left W Wo Contrast 06/30/2013    IMPRESSION: Cellulitis about the lower leg and with a large ulceration over the heel. Signal abnormality in the calcaneus as described above is consistent with osteomyelitis. No abscess is identified.  Signal abnormality and thickening in the distal Achilles tendon is consistent with tendinosis. Edema and enhancement in the distal 4 cm of the tendon could be due to infection although there is no fluid about the tendon as is typically seen in infectious tenosynovitis.     Mr Knee Left  Wo Contrast 06/30/2013    IMPRESSION: Minimal marrow edema in the posterior medial femoral condyle and medial tibial plateau is likely related to degenerative disease. The appearance of the knee is not suggestive  of septic joint or osteomyelitis.   Dg  Foot Complete Left 06/29/2013   IMPRESSION: Soft tissue wound noted over calcaneus. No evidence of bony abnormality. No lytic bony abnormality. No foreign body.      Microbiology: CULTURE, BLOOD (ROUTINE X 2)     Status: None   Collection Time    06/29/13  9:14 PM      Result Value Range Status   Specimen Description BLOOD RIGHT HAND   Final   Value:        BLOOD CULTURE RECEIVED NO GROWTH TO DATE    Report Status PENDING   Incomplete  CULTURE, BLOOD (ROUTINE X 2)     Status: None   Collection Time    06/29/13  9:25 PM      Result Value Range Status   Specimen Description BLOOD RIGHT HAND   Final   Value:        BLOOD CULTURE RECEIVED NO GROWTH TO DATE    Report Status PENDING   Incomplete  URINE CULTURE     Status: None   Collection Time    06/29/13  9:38 PM      Result Value Range Status   Specimen Description URINE, CLEAN CATCH   Final   Value: Multiple bacterial morphotypes present   Report Status 07/01/2013 FINAL   Final  MRSA PCR SCREENING     Status: None   Collection Time    06/30/13  1:04 AM      Result Value Range Status   MRSA by PCR NEGATIVE  NEGATIVE Final  SURGICAL PCR SCREEN     Status: None   Collection Time    07/02/13  3:54 AM      Result Value Range Status   MRSA, PCR NEGATIVE  NEGATIVE Final     Labs: Basic Metabolic Panel:  Recent Labs Lab 06/29/13 1532 06/30/13 0405 07/02/13 0930 07/03/13 0416 07/04/13 0358  NA 142 139 140 141 146  [name] 4.4 4.3 4.1 3.6* 4.4  CL 103 102 106 104 108  CO2 24 23 19 24 24   GLUCOSE 218* 227* 136* 187* 150*  BUN 46* 47* 50* 47* 41*  CREATININE 1.87* 1.96* 2.63* 2.63* 2.59*  CALCIUM 9.3 8.6 7.9* 8.0* 8.2*  MG  --   --   --  2.1  --    Liver Function Tests:  Recent Labs Lab 06/29/13 1532 06/30/13 0405 07/04/13 0358  AST 11 9 13   ALT <5 <5 7  ALKPHOS 92 67 81  BILITOT <0.2* 0.3 0.3  PROT 8.8* 7.3 6.7  ALBUMIN 2.9* 2.5* 2.7*   No results found for this basename:  LIPASE, AMYLASE,  in the last 168 hours No results found for this basename: AMMONIA,  in the last 168 hours CBC:  Recent Labs Lab 06/29/13 1532 06/30/13 0405 07/03/13 0416 07/04/13 0358  WBC 15.4* 11.7* 7.4 7.3  NEUTROABS 12.7*  --   --   --   HGB 11.2* 10.4* 7.7* 8.3*  HCT 33.7* 31.3* 23.8* 25.1*  MCV 83.8 83.0 83.5 83.1  PLT 348 302 197 205   Cardiac Enzymes: No results found for this basename: CKTOTAL, CKMB, CKMBINDEX, TROPONINI,  in the last 168 hours BNP: BNP (last 3 results)  Recent Labs  03/22/13 0513 03/23/13 0450 03/24/13 0415  PROBNP 16464.0* 20208.0* 20835.0*   CBG:  Recent Labs Lab 07/04/13 0748 07/04/13 1147 07/04/13 1808 07/04/13 2323 07/05/13 0721  GLUCAP 153* 245* 182* 154* 127*    Time coordinating discharge: Over 30 minutes

## 2013-07-04 NOTE — Progress Notes (Signed)
Attempted to insert PICC into right basilic and cephalic veins without success.   Unable to thread guidewires.  Referred to Interventional Radiology.  Spoke with Tiffany and made aware of need for PICC and that pt was to leave for SNF today.  Primary RN also made aware.

## 2013-07-04 NOTE — Progress Notes (Signed)
ANTIBIOTIC CONSULT NOTE - FOLLOW UP  Pharmacy Consult for Vancomycin Indication: osteomyelitis/cellulitis left leg status post incision and drainage   Allergies  Allergen Reactions  . Ancef [Cefazolin] Itching and Rash    rash  . Sulfa Antibiotics Rash    Patient Measurements: Height: 5\' 11"  (180.3 cm) Weight: 227 lb 12.8 oz (103.329 kg) IBW/kg (Calculated) : 75.3 Adjusted Body Weight:   Vital Signs: Temp: 98 F (36.7 C) (01/26 2148) Temp src: Oral (01/26 2148) BP: 132/81 mmHg (01/26 2148) Pulse Rate: 91 (01/26 2148) Intake/Output from previous day: 01/26 0701 - 01/27 0700 In: 480 [P.O.:480] Out: 1800 [Urine:1800] Intake/Output from this shift: Total I/O In: -  Out: 400 [Urine:400]  Labs:  Recent Labs  07/02/13 0930 07/03/13 0416  WBC  --  7.4  HGB  --  7.7*  PLT  --  197  CREATININE 2.63* 2.63*   Estimated Creatinine Clearance: 32.9 ml/min (by C-G formula based on Cr of 2.63).  Recent Labs  07/02/13 2100 07/03/13 2045  VANCOTROUGH 33.1* 21.9*     Microbiology: Recent Results (from the past 720 hour(s))  CULTURE, BLOOD (ROUTINE X 2)     Status: None   Collection Time    06/29/13  9:14 PM      Result Value Range Status   Specimen Description BLOOD RIGHT HAND   Final   Special Requests BOTTLES DRAWN AEROBIC ONLY 2ML   Final   Culture  Setup Time     Final   Value: 06/30/2013 01:51     Performed at [facility]   Culture     Final   Value:        BLOOD CULTURE RECEIVED NO GROWTH TO DATE CULTURE WILL BE HELD FOR 5 DAYS BEFORE ISSUING A FINAL NEGATIVE REPORT     Performed at [facility]   Report Status PENDING   Incomplete  CULTURE, BLOOD (ROUTINE X 2)     Status: None   Collection Time    06/29/13  9:25 PM      Result Value Range Status   Specimen Description BLOOD RIGHT HAND   Final   Special Requests BOTTLES DRAWN AEROBIC ONLY 2ML   Final   Culture  Setup Time     Final   Value: 06/30/2013 01:51     Performed at [facility]   Culture     Final   Value:        BLOOD CULTURE RECEIVED NO GROWTH TO DATE CULTURE WILL BE HELD FOR 5 DAYS BEFORE ISSUING A FINAL NEGATIVE REPORT     Performed at [facility]   Report Status PENDING   Incomplete  URINE CULTURE     Status: None   Collection Time    06/29/13  9:38 PM      Result Value Range Status   Specimen Description URINE, CLEAN CATCH   Final   Special Requests NONE   Final   Culture  Setup Time     Final   Value: 06/30/2013 03:46     Performed at [facility]   Colony Count     Final   Value: 50,000 COLONIES/ML     Performed at [facility]   Culture     Final   Value: Multiple bacterial morphotypes present, none predominant. Suggest appropriate recollection if clinically indicated.     Performed at [facility]   Report Status 07/01/2013 FINAL   Final  MRSA PCR SCREENING  Status: None   Collection Time    06/30/13  1:04 AM      Result Value Range Status   MRSA by PCR NEGATIVE  NEGATIVE Final   Comment:            The GeneXpert MRSA Assay (FDA     approved for NASAL specimens     only), is one component of a     comprehensive MRSA colonization     surveillance program. It is not     intended to diagnose MRSA     infection nor to guide or     monitor treatment for     MRSA infections.  SURGICAL PCR SCREEN     Status: None   Collection Time    07/02/13  3:54 AM      Result Value Range Status   MRSA, PCR NEGATIVE  NEGATIVE Final   Staphylococcus aureus NEGATIVE  NEGATIVE Final   Comment:            The Xpert SA Assay (FDA     approved for NASAL specimens     in patients over 69 years of age),     is one component of     a comprehensive surveillance     program.  Test performance has     been validated by [employer] for patients greater     than or equal to 81 year old.     It is not intended     to diagnose infection nor to     guide or monitor treatment.    Anti-infectives   Start      Dose/Rate Route Frequency Ordered Stop   07/04/13 0900  vancomycin (VANCOCIN) 1,500 mg in sodium chloride 0.9 % 500 mL IVPB     1,500 mg 250 mL/hr over 120 Minutes Intravenous  Once 07/04/13 0126     06/30/13 2200  vancomycin (VANCOCIN) 1,500 mg in sodium chloride 0.9 % 500 mL IVPB  Status:  Discontinued     1,500 mg 250 mL/hr over 120 Minutes Intravenous Every 24 hours 06/29/13 2218 07/02/13 1510   06/29/13 2300  piperacillin-tazobactam (ZOSYN) IVPB 3.375 g     3.375 g 12.5 mL/hr over 240 Minutes Intravenous Every 8 hours 06/29/13 2218     06/29/13 2230  vancomycin (VANCOCIN) IVPB 1000 mg/200 mL premix     1,000 mg 200 mL/hr over 60 Minutes Intravenous STAT 06/29/13 2218 06/30/13 0152   06/29/13 2100  piperacillin-tazobactam (ZOSYN) IVPB 3.375 g  Status:  Discontinued     3.375 g 100 mL/hr over 30 Minutes Intravenous  Once 06/29/13 2048 06/29/13 2218   06/29/13 2015  piperacillin-tazobactam (ZOSYN) IVPB 3.375 g  Status:  Discontinued     3.375 g 100 mL/hr over 30 Minutes Intravenous  Once 06/29/13 1951 06/29/13 2033   06/29/13 1730  vancomycin (VANCOCIN) IVPB 1000 mg/200 mL premix     1,000 mg 200 mL/hr over 60 Minutes Intravenous  Once 06/29/13 1705 06/29/13 2046      Assessment: Patient with still high vancomycin level after dose held >24hours.  Level reduced less than 50% in 24hrs.    Goal of Therapy:  Vancomycin trough level 15-20 mcg/ml  Plan:  Measure antibiotic drug levels at steady state Follow up culture results Vancomycin 1500mg  iv x1 at 0900 1/27 Will plan on assessing renal function in AM to determine future doses and/or levels  Darlina Guys, Jacquenette Shone Crowford 07/04/2013,1:27 AM

## 2013-07-04 NOTE — Discharge Instructions (Signed)
Bone and Joint Infections °Joint infections are called septic or infectious arthritis. An infected joint may damage cartilage and tissue very quickly. This may destroy the joint. Bone infections (osteomyelitis) may last for years. Joints may become stiff if left untreated. Bacteria are the most common cause. Other causes include viruses and fungi, but these are more rare. Bone and joint infections usually come from injury or infection elsewhere in your body; the germs are carried to your bones or joints through the bloodstream.  °CAUSES  °· Blood-carried germs from an infection elsewhere in your body can eventually spread to a bone or joint. The germ staphylococcus is the most common cause of both osteomyelitis and septic arthritis. °· An injury can introduce germs into your bones or joints. °SYMPTOMS  °· Weight loss. °· Tiredness. °· Chills and fever. °· Bone or joint pain at rest and with activity. °· Tenderness when touching the area or bending the joint. °· Refusal to bear weight on a leg or inability to use an arm due to pain. °· Decreased range of motion in a joint. °· Skin redness, warmth, and tenderness. °· Open skin sores and drainage. °RISK FACTORS °Children, the elderly, and those with weak immune systems are at increased risk of bone and joint infections. It is more common in people with HIV infections and with people on chemotherapy. People are also at increased risk if they have surgery where metal implants are used to stabilize the bone. Plates, screws, or artificial joints provide a surface that bacteria can stick on. Such a growth of bacteria is called biofilm. The biofilm protects bacteria from antibiotics and bodily defenses. This allows germs to multiply. Other reasons for increased risks include:  °· Having previous surgery or injury of a bone or joint. °· Being on high-dose corticosteroids and immunosuppressive medications that weaken your body's resistance to germs. °· Diabetes and  long-standing diseases. °· Use of intravenous street drugs. °· Being on hemodialysis. °· Having a history of urinary tract infections. °· Removal of your spleen (splenectomy). This weakens your immunity. °· Chronic viral infections such as HIV or AIDS. °· Lack of sensation such as paraplegia, quadriplegia, or spina bifida. °DIAGNOSIS  °· Increased numbers of white blood cells in your blood may indicate infection. Some times your caregivers are able to identify the infecting germs by testing your blood. Inflammatory markers present in your bloodstream such as an erythrocyte sedimentation rate (ESR or sed rate) or c-reactive protein (CRP) can be indicators of deep infection. °· Bone scans and X-ray exams are necessary for diagnosing osteomyelitis. They may help your caregiver find the infected areas. Other studies may give more detailed information. They may help detect fluid collections around a joint, abnormal bone surfaces, or be useful in diagnosing septic arthritis. They can find soft tissue swelling and find excess fluid in an infected joint or the adjacent bone. These tests include: °· Ultrasound. °· CT (computerized tomography). °· MRI (magnetic resonance imaging). °· The best test for diagnosing a bone or joint infection is an aspiration or biopsy. Your caregiver will usually use a local anesthetic. He or she can then remove tissue from a bone injury or use a needle to take fluids from an infected joint. A local anesthetic medication numbs the area to be biopsied. Often biopsies are done in the operating room under general anesthesia. This means you will be asleep during the procedure. Tests performed on these samples can identify an infection. °TREATMENT  °· Treatment can help control long-standing   infections, but infections may come back. °· Infections can infect any bone or joint at any age. °· Bone and joint infections are rarely fatal. °· Bone infection left untreated can become a never-ending infection.  It can spread to other areas of your body. It may eventually cause bone death. Reduced limb or joint function can result. In severe cases, this may require removal of a limb. Spinal osteomyelitis is very dangerous. Untreated, it may damage spinal nerves and cause death. °· The most common complication of septic arthritis is osteoarthritis with pain and decreased range of motion of the joint. °Some forms of treatment may include: °· If the infection is caused by bacteria, it is generally treated with antibiotics. You will likely receive the drugs through a vein (intravenously) for anywhere from 2 to 6 weeks. In some cases, especially with children, oral antibiotics following an initial intravenous dose may be effective. The treatment you receive depends on the: °· Type of bacteria. °· Location of the infection. °· Type of surgery that might be done. °· Other health conditions or issues you might have. °· Your caregiver may drain soft tissue abscesses or pockets of fluid around infected bones or joints. If you have septic arthritis, your caregiver may use a needle to drain pus from the joint on a daily basis. He or she may use an arthroscope to clean the joint or may need to open the joint surgically to remove damaged tissue and infection. An arthroscope is an instrument like a thin lighted telescope. It can be used to look inside the joint. °· Surgery is usually needed if the infection has become long-standing. It may also be needed if there is hardware (such as metal plates, screws, or artificial joints) inside the patient. Sometimes a bone or muscle graft is needed to fill in the open space. This promotes growth of new tissues and better blood flow to the area. °PREVENTION  °· Clean and disinfect wounds quickly to help prevent the start of a bone or joint infection. Get treatment for any infections to prevent spread to a bone or joint. °· Do not smoke. Smoking decreases healing rates of bone and predisposes to  infection. °· When given medications that suppress your immune system, use them according to your caregiver's instructions. Do not take more than prescribed for your condition. °· Take good care of your feet and skin, especially if you have diabetes, decreased sensation or circulation problems. °SEEK IMMEDIATE MEDICAL CARE IF:  °· You cannot bear weight on a leg or use an arm, especially following a minor injury. This can be a sign of bone or joint infection. °· You think you may have signs or symptoms of a bone or joint infection. Your chance of getting rid of an infection is better if treated early. °Document Released: 05/25/2005 Document Revised: 08/17/2011 Document Reviewed: 04/24/2009 °ExitCare® Patient Information ©2014 [employer]. ° °

## 2013-07-05 ENCOUNTER — Other Ambulatory Visit: Payer: Self-pay | Admitting: *Deleted

## 2013-07-05 ENCOUNTER — Encounter: Payer: Self-pay | Admitting: Internal Medicine

## 2013-07-05 ENCOUNTER — Inpatient Hospital Stay (HOSPITAL_COMMUNITY): Payer: Medicare Other

## 2013-07-05 ENCOUNTER — Non-Acute Institutional Stay (SKILLED_NURSING_FACILITY): Payer: Medicare Other | Admitting: Internal Medicine

## 2013-07-05 DIAGNOSIS — E1159 Type 2 diabetes mellitus with other circulatory complications: Secondary | ICD-10-CM

## 2013-07-05 DIAGNOSIS — I509 Heart failure, unspecified: Secondary | ICD-10-CM

## 2013-07-05 DIAGNOSIS — R7881 Bacteremia: Secondary | ICD-10-CM

## 2013-07-05 DIAGNOSIS — IMO0002 Reserved for concepts with insufficient information to code with codable children: Secondary | ICD-10-CM

## 2013-07-05 DIAGNOSIS — N184 Chronic kidney disease, stage 4 (severe): Secondary | ICD-10-CM

## 2013-07-05 DIAGNOSIS — L03119 Cellulitis of unspecified part of limb: Principal | ICD-10-CM

## 2013-07-05 DIAGNOSIS — E1151 Type 2 diabetes mellitus with diabetic peripheral angiopathy without gangrene: Secondary | ICD-10-CM

## 2013-07-05 DIAGNOSIS — R5381 Other malaise: Secondary | ICD-10-CM

## 2013-07-05 DIAGNOSIS — D638 Anemia in other chronic diseases classified elsewhere: Secondary | ICD-10-CM

## 2013-07-05 DIAGNOSIS — L02619 Cutaneous abscess of unspecified foot: Secondary | ICD-10-CM

## 2013-07-05 DIAGNOSIS — R262 Difficulty in walking, not elsewhere classified: Secondary | ICD-10-CM

## 2013-07-05 DIAGNOSIS — I5032 Chronic diastolic (congestive) heart failure: Secondary | ICD-10-CM

## 2013-07-05 DIAGNOSIS — S91309A Unspecified open wound, unspecified foot, initial encounter: Secondary | ICD-10-CM

## 2013-07-05 DIAGNOSIS — E1165 Type 2 diabetes mellitus with hyperglycemia: Secondary | ICD-10-CM

## 2013-07-05 DIAGNOSIS — B958 Unspecified staphylococcus as the cause of diseases classified elsewhere: Secondary | ICD-10-CM

## 2013-07-05 LAB — VANCOMYCIN, TROUGH: Vancomycin Tr: 23.8 ug/mL — ABNORMAL HIGH (ref 10.0–20.0)

## 2013-07-05 LAB — GLUCOSE, CAPILLARY
GLUCOSE-CAPILLARY: 127 mg/dL — AB (ref 70–99)
Glucose-Capillary: 111 mg/dL — ABNORMAL HIGH (ref 70–99)

## 2013-07-05 MED ORDER — HYDROCODONE-ACETAMINOPHEN 5-325 MG PO TABS: ORAL_TABLET | ORAL | Status: DC

## 2013-07-05 MED ORDER — LIDOCAINE HCL 1 % IJ SOLN
INTRAMUSCULAR | Status: AC
  Filled 2013-07-05: qty 20

## 2013-07-05 MED ORDER — VANCOMYCIN HCL 10 G IV SOLR: 1500.0000 mg | INTRAVENOUS | Status: DC

## 2013-07-05 NOTE — Assessment & Plan Note (Addendum)
Was 8.3 yesterday and pt declined transfusion

## 2013-07-05 NOTE — Assessment & Plan Note (Signed)
See above ,cellulitis and abscess

## 2013-07-05 NOTE — Assessment & Plan Note (Signed)
2D ECHO - 10/14  EF 55%;  No change in Lasix and  Zaroxolyn and coreg 12.5 BID

## 2013-07-05 NOTE — Progress Notes (Signed)
[facility] PROGRESS NOTE   Justin Nipp Sr. ZOX:[id number] DOB: [birth year] DOA: 06/29/2013 PCP: [name], MD  Brief narrative: 68 y.o. male with a PMH of diabetes mellitus, hypertension, diastolic dysfunction, MRSA infection who presented to [facility] ED 06/29/2013 with left non heeling heel ulcer. On admission, vitals were stable. CBC revealed WBC count of 15.4, hemoglobin of 11.2. BMP revealed creatinine of 1.87. Left foot x ray revealed soft tissue wound over calcaneus but no bony abnormality. MRI of the left foot revealed cellulitic changes with large ulceration over the left heel, signal abnormality in the calcaneus consistent with osteomyelitis. Pt underwent I&D 07/02/2013 by ortho.    Assessment/Plan: Principal Problem:  Left foot osteomyelitis, abscess, cellulitis  - Status post I&D by orthopedic surgery (Dr. Magnus Ivan) on 07/02/2013. - Treated with vanco and zosyn. - Wound: Left heel 6.7 cm x 7.5 cm, 100% eschar. Right malleolus measures 0.5 cm x 0.2 cm x 0.1 cm. Active Problems:  Diabetes, with peripheral neuropathy  - A1c 9.7 on this admission; status post diabetic coordinator consult and recommendations.  - Continue insulin sliding scale; regular regimen with Lantus increased to 12 units at bedtime; added Novolog 4 units TID.  Chronic diastolic CHF  - Based on last 2 D ECHO EF 55% (03/2013).  - Compensated  - Continue lasix and metolazone; per cardio lasix is now 40 mg IV daily; continue to monitor renal function.  - Continue coreg 12.5 mg PO BID.  Chronic kidney disease, stage 4  - Creatinine trending up since admission, 1.87 --> 2.63; in 03/2013 creatinine as high as 3.79, recommend close followup of renal function.   Anemia of chronic kidney disease  - Hemoglobin steadily dropping 11.2 --> 10.4 --> 7.7. - Drop likely from postoperative blood loss and dilutional factors. - The patient declined transfusion.  Leukocytosis  - Secondary to osteomyelitis, resolved.  -  Continue vanco and zosyn  Hypokalemia  - Secondary to diuretics. Repleted.  Code Status: Full. Family Communication: No family at bedside. Disposition Plan: SNF today.   IV access:  IJ tunneled central catheter placed 07/05/13  Medical Consultants:  Dr. Rinaldo Cloud, Cardiology  Dr. Doneen Poisson, Orthopedics  Interventional radiology  Other Consultants:  Diabetes coordinator  Anti-infectives:  Vancomycin 06/29/13--->  Zosyn 06/29/13--->  HPI/Subjective: Justin Hansen Sr. is without complaints. He got his IJ catheter placed.  Objective: Filed Vitals:   07/04/13 0549 07/04/13 1445 07/04/13 2104 07/05/13 0446  BP: 134/58 128/64 127/63 127/63  Pulse: 86 98 103 98  Temp: 98.3 F (36.8 C) 97.4 F (36.3 C) 98.3 F (36.8 C) 99 F (37.2 C)  TempSrc: Oral Oral Oral Oral  Resp: 18 18 18 18   Height:      Weight:    101.8 kg (224 lb 6.9 oz)  SpO2: 99% 100% 98% 97%    Intake/Output Summary (Last 24 hours) at 07/05/13 0952 Last data filed at 07/05/13 8119  Gross per 24 hour  Intake    360 ml  Output    500 ml  Net   -140 ml    Exam: Gen:  NAD Cardiovascular:  RRR, No M/R/G Respiratory:  Lungs CTAB Gastrointestinal:  Abdomen soft, NT/ND, + BS Extremities:  No C/E/C, dressings intact left foot  Data Reviewed: Basic Metabolic Panel:  Recent Labs Lab 06/29/13 1532 06/30/13 0405 07/02/13 0930 07/03/13 0416 07/04/13 0358  NA 142 139 140 141 146  K 4.4 4.3 4.1 3.6* 4.4  CL 103 102 106 104 108  CO2 24 23 19  24 24  GLUCOSE 218* 227* 136* 187* 150*  BUN 46* 47* 50* 47* 41*  CREATININE 1.87* 1.96* 2.63* 2.63* 2.59*  CALCIUM 9.3 8.6 7.9* 8.0* 8.2*  MG  --   --   --  2.1  --    GFR Estimated Creatinine Clearance: 33.2 ml/min (by C-G formula based on Cr of 2.59). Liver Function Tests:  Recent Labs Lab 06/29/13 1532 06/30/13 0405 07/04/13 0358  AST 11 9 13   ALT <5 <5 7  ALKPHOS 92 67 81  BILITOT <0.2* 0.3 0.3  PROT 8.8* 7.3 6.7  ALBUMIN  2.9* 2.5* 2.7*   Coagulation profile  Recent Labs Lab 06/30/13 0405  INR 1.05    CBC:  Recent Labs Lab 06/29/13 1532 06/30/13 0405 07/03/13 0416 07/04/13 0358  WBC 15.4* 11.7* 7.4 7.3  NEUTROABS 12.7*  --   --   --   HGB 11.2* 10.4* 7.7* 8.3*  HCT 33.7* 31.3* 23.8* 25.1*  MCV 83.8 83.0 83.5 83.1  PLT 348 302 197 205   BNP (last 3 results)  Recent Labs  03/22/13 0513 03/23/13 0450 03/24/13 0415  PROBNP 16464.0* 20208.0* 20835.0*   CBG:  Recent Labs Lab 07/04/13 0748 07/04/13 1147 07/04/13 1808 07/04/13 2323 07/05/13 0721  GLUCAP 153* 245* 182* 154* 127*   Microbiology Recent Results (from the past 240 hour(s))  CULTURE, BLOOD (ROUTINE X 2)     Status: None   Collection Time    06/29/13  9:14 PM      Result Value Range Status   Specimen Description BLOOD RIGHT HAND   Final   Special Requests BOTTLES DRAWN AEROBIC ONLY 2ML   Final   Culture  Setup Time     Final   Value: 06/30/2013 01:51     Performed at [facility]   Culture     Final   Value:        BLOOD CULTURE RECEIVED NO GROWTH TO DATE CULTURE WILL BE HELD FOR 5 DAYS BEFORE ISSUING A FINAL NEGATIVE REPORT     Performed at [facility]   Report Status PENDING   Incomplete  CULTURE, BLOOD (ROUTINE X 2)     Status: None   Collection Time    06/29/13  9:25 PM      Result Value Range Status   Specimen Description BLOOD RIGHT HAND   Final   Special Requests BOTTLES DRAWN AEROBIC ONLY 2ML   Final   Culture  Setup Time     Final   Value: 06/30/2013 01:51     Performed at [facility]   Culture     Final   Value:        BLOOD CULTURE RECEIVED NO GROWTH TO DATE CULTURE WILL BE HELD FOR 5 DAYS BEFORE ISSUING A FINAL NEGATIVE REPORT     Performed at [facility]   Report Status PENDING   Incomplete  URINE CULTURE     Status: None   Collection Time    06/29/13  9:38 PM      Result Value Range Status   Specimen Description URINE, CLEAN CATCH   Final   Special Requests  NONE   Final   Culture  Setup Time     Final   Value: 06/30/2013 03:46     Performed at [facility]   Colony Count     Final   Value: 50,000 COLONIES/ML     Performed at [facility]   Culture  Final   Value: Multiple bacterial morphotypes present, none predominant. Suggest appropriate recollection if clinically indicated.     Performed at [facility]   Report Status 07/01/2013 FINAL   Final  MRSA PCR SCREENING     Status: None   Collection Time    06/30/13  1:04 AM      Result Value Range Status   MRSA by PCR NEGATIVE  NEGATIVE Final   Comment:            The GeneXpert MRSA Assay (FDA     approved for NASAL specimens     only), is one component of a     comprehensive MRSA colonization     surveillance program. It is not     intended to diagnose MRSA     infection nor to guide or     monitor treatment for     MRSA infections.  SURGICAL PCR SCREEN     Status: None   Collection Time    07/02/13  3:54 AM      Result Value Range Status   MRSA, PCR NEGATIVE  NEGATIVE Final   Staphylococcus aureus NEGATIVE  NEGATIVE Final   Comment:            The Xpert SA Assay (FDA     approved for NASAL specimens     in patients over 15 years of age),     is one component of     a comprehensive surveillance     program.  Test performance has     been validated by [employer] for patients greater     than or equal to 81 year old.     It is not intended     to diagnose infection nor to     guide or monitor treatment.     Procedures and Diagnostic Studies:  ABIs on/24/2014: Right 1.33, left 1.17. ABIs and Doppler waveforms are within normal limits bilaterally.   Mr Foot Left W Wo Contrast 06/30/2013 IMPRESSION: Cellulitis about the lower leg and with a large ulceration over the heel. Signal abnormality in the calcaneus as described above is consistent with osteomyelitis. No abscess is identified.  Signal abnormality and thickening in the distal Achilles  tendon is consistent with tendinosis. Edema and enhancement in the distal 4 cm of the tendon could be due to infection although there is no fluid about the tendon as is typically seen in infectious tenosynovitis.   Electronically Signed   By: Drusilla Kanner M.D.   On: 06/30/2013 07:32    Mr Knee Left  Wo Contrast 06/30/2013  IMPRESSION: Minimal marrow edema in the posterior medial femoral condyle and medial tibial plateau is likely related to degenerative disease. The appearance of the knee is not suggestive of septic joint or osteomyelitis.  Oblique tear posterior horn medial meniscus.   Electronically Signed   By: Drusilla Kanner M.D.   On: 06/30/2013 07:26    Dg Foot Complete Left 06/29/2013 IMPRESSION: Soft tissue wound noted over calcaneus. No evidence of bony abnormality. No lytic bony abnormality. No foreign body.   Electronically Signed   By: Maisie Fus  Register   On: 06/29/2013 16:26    Scheduled Meds: . carvedilol  6.25 mg Oral BID WC  . furosemide  40 mg Intravenous Daily  . insulin aspart  0-15 Units Subcutaneous TID WC  . insulin aspart  0-5 Units Subcutaneous QHS  . insulin aspart  4 Units Subcutaneous TID WC  .  insulin glargine  12 Units Subcutaneous QHS  . lidocaine      . metolazone  5 mg Oral Q24H  . pantoprazole  40 mg Oral Daily  . piperacillin-tazobactam (ZOSYN)  IV  3.375 g Intravenous Q8H  . potassium chloride  20 mEq Oral BID  . tamsulosin  0.4 mg Oral Daily   Continuous Infusions:   Time spent: 25 minutes.   LOS: 6 days   RAMA,CHRISTINA  [facility] Pager [phone].   *Please note that the hospitalists switch teams on Wednesdays. Please call the flow manager at [phone] if you are having difficulty reaching the hospitalist taking care of this patient as she can update you and provide the most up-to-date pager number of provider caring for the patient. If 8PM-8AM, please contact night-coverage at [email or web address], password [facility]  07/05/2013, 9:52 AM

## 2013-07-05 NOTE — Progress Notes (Signed)
ANTIBIOTIC CONSULT NOTE - FOLLOW UP  Pharmacy Consult for vancomycin and Zosyn Indication: cellulitis/necriotic wound  Allergies  Allergen Reactions  . Ancef [Cefazolin] Itching and Rash    rash  . Sulfa Antibiotics Rash    Patient Measurements: Height: 5\' 11"  (180.3 cm) Weight: 224 lb 6.9 oz (101.8 kg) IBW/kg (Calculated) : 75.3   Vital Signs: Temp: 99 F (37.2 C) (01/28 0446) Temp src: Oral (01/28 0446) BP: 127/63 mmHg (01/28 0446) Pulse Rate: 98 (01/28 0446) Intake/Output from previous day: 01/27 0701 - 01/28 0700 In: 600 [P.O.:600] Out: 850 [Urine:850]  Labs:  Recent Labs  07/02/13 0930 07/03/13 0416 07/04/13 0358  WBC  --  7.4 7.3  HGB  --  7.7* 8.3*  PLT  --  197 205  CREATININE 2.63* 2.63* 2.59*   Estimated Creatinine Clearance: 33.2 ml/min (by C-G formula based on Cr of 2.59).  Recent Labs  07/02/13 2100 07/03/13 2045  VANCOTROUGH 33.1* 21.9*     Assessment: [age] yoM admitted 1/22 with L leg ulcer found to have osteomyelitis and will require 6 weeks of IV antibiotics. Pt with PMH significant for diabetes mellitus and acute on chronic kidney dysfunction. Pt is s/p I&D in the OR on 1/25. Pharmacy has been consulted to dose vancomycin and Zosyn.  Patient with high vancomycin trough to 33.1 on 1/25 so vancomycin has been discontinued [facility] and Pharmacy is using vancomycin level to guide dosing  Noted patient with PICC placement today and then transfer to a SNF where he will receive vancomycin and levofloxacin IV  Antiinfectives 1/22 >> vancomycin >> 1/22 >> Zosyn >>   Tmax: 99 (on APAP) WBCs: improved to WNL, 7.3k Renal: Scr remains elevated at 2.59 (baseline ~2.0), CG 33, N 27  Microbiology 1/22 urine >> 50K multiple organisms 1/22 blood x 2 >> NGTD  1/25 MRSA PCR (-)  Dose changes/drug level info:  1/25 VT @ 2100= 33.1, vanc d/c'd off mAR 1/26 VT @ 2100 =21.9 1/28 VT @ 0900 =23.3, 1500mg  vancomycin administered 1/27 at 1047, this  dose q48h will likely produce desired trough goal   Goal of Therapy:  Vancomycin trough level 15-20 mcg/ml appropriate renal dosing of Zosyn and levofloxacin  Plan:  - continue Zosyn 3.375G IV q8h to be infused over 4 hours until discharge - upon discharge begin levofloxacin 750mg  IV q48h - vancomycin 1500mg  q48h to be started 1/29 at 1000 - monitor vancomycin troughs and SCr at least weekly while on prolonged outpatient therapy - pharmacy will continue to follow while inpatient  Thank you for the consult.  Tomi BambergerJesse Dreshon Proffit, PharmD, BCPS Clinical Pharmacist Pager: [phone] Pharmacy: [phone] 07/05/2013 8:57 AM

## 2013-07-05 NOTE — Assessment & Plan Note (Signed)
With osteomyelitis, debrided in surgery 1/25 with wound left 6.7 cm, X 7.5 cm 100% eschar;R malleolus 0.5cmX 0.2 X 0.1 cm Vancomycin needs to be dosed daily with peak 15-20, levels drawn daily; for 6 week total

## 2013-07-05 NOTE — Procedures (Signed)
Interventional Radiology Procedure Note  Procedure: Placement of right IJ tunneled dual lumen PowerPICC.  Tip at superior cavoatrial junction and ready for use.  Complications: none Recommendations: - Routine line care  Signed,  Sterling BigHeath K. McCullough, MD [facility]

## 2013-07-05 NOTE — Progress Notes (Signed)
Patient stable for discharge from AM assessment. [facility] [place] [facility]. Patient belongings were taken home yesterday by his daughter. Will notify wife of his discharge. [facility] will transport.

## 2013-07-05 NOTE — Progress Notes (Signed)
Clinical Social Work  CS[facility] and spoke with [facility]onda who is agreeable to accept patient today. CSW informed patient of DC plans and patient reports CSW does not need to call family but he will call wife so she is aware of plans. Chart copy in wall-a-roo. CSW coordinated transportation via [facility] per patient request who is aware of no guarantee of payment. [facility] Request #: [id number].  CSW is signing off but available if needed.  Unk LightningHolly Idil Maslanka, LCSW (Coverage for [employer])

## 2013-07-05 NOTE — Assessment & Plan Note (Signed)
Cr on admission was 1.87; went up to 2.63 and is now trending down again;will follow 2/2

## 2013-07-05 NOTE — Assessment & Plan Note (Signed)
HbA1c was 9.7 on hospital admission; Lantus 12 units QHS;  And sliding scale to include the novolog 4 u with meals

## 2013-07-05 NOTE — Progress Notes (Signed)
MRN: [id number] Name: Justin HansenDonald Lazalde Sr.  Sex: male Age: 68 y.o. DOB: 04/18/1946  PSC #: [name] Facility/Room: 120 Level Of Care: SNF Provider: Merrilee SeashoreALEXANDER, Jennyfer Nickolson D Emergency Contacts: Extended Emergency Contact Information Primary Emergency Contact: Krisher,[name] Address: [address]          [place], [place] [phone] [place] Home Phone: [phone] Relation: Spouse  Code Status:   Allergies: Ancef and Sulfa antibiotics  Chief Complaint  Patient presents with  . nursing home admission    HPI: Patient is 68 y.o. male who has a diabetic foot wound requiring IV abx for 6 weeks and OT/PT.  Past Medical History  Diagnosis Date  . Diabetes mellitus   . Hypertension   . Hyperlipidemia   . CHF (congestive heart failure)   . Enlarged heart   . Pneumonia     hx of   . CKD (chronic kidney disease), stage IV     Past Surgical History  Procedure Laterality Date  . Right leg fracture    . Skin graft    . Knee surgery    . Tee without cardioversion  03/17/2012    Procedure: TRANSESOPHAGEAL ECHOCARDIOGRAM (TEE);  Surgeon: Ricki RodriguezAjay S Kadakia, MD;  [facility];  Service: Cardiovascular;  Laterality: N/A;  . Incision and drainage Left 07/02/2013    Procedure: INCISION AND DRAINAGE LEFT FOOT;  Surgeon: Kathryne Hitchhristopher Y Blackman, MD;  Location: [facility] ORS;  Service: Orthopedics;  Laterality: Left;      Medication List    Notice   This visit is during an admission. Changes to the med list made in this visit will be reflected in the After Visit Summary of the admission.     Current Outpatient Prescriptions on File Prior to Visit  Medication Sig Dispense Refill  . carvedilol (COREG) 12.5 MG tablet Take 1 tablet (12.5 mg total) by mouth 2 (two) times daily with a meal.  60 tablet  3  . furosemide (LASIX) 80 MG tablet Take 80 mg by mouth daily.      Marland Kitchen. HYDROcodone-acetaminophen (NORCO/VICODIN) 5-325 MG per tablet Take one tablet by mouth every four hours as  needed for pain  180 tablet  0  . insulin glargine (LANTUS) 100 UNIT/ML injection Inject 0.12 mLs (12 Units total) into the skin at bedtime.  10 mL  11  . levofloxacin (LEVAQUIN) 750 MG tablet Take 1 tablet (750 mg total) by mouth every other day.  21 tablet  0  . Menthol-Methyl Salicylate (MUSCLE RUB) 10-15 % CREA Apply 1 application topically as needed (neck and shoulder pain).      Marland Kitchen. metolazone (ZAROXOLYN) 5 MG tablet Take 1 tablet (5 mg total) by mouth daily.  30 tablet  3  . Multiple Vitamins-Minerals (CENTRUM SILVER ADULT 50+ PO) Take 0.5 tablets by mouth daily.      . ondansetron (ZOFRAN) 4 MG tablet Take 1 tablet (4 mg total) by mouth every 6 (six) hours as needed for nausea.  20 tablet  0  . pantoprazole (PROTONIX) 40 MG tablet Take 1 tablet (40 mg total) by mouth daily.  30 tablet  3  . potassium chloride SA (K-DUR,KLOR-CON) 20 MEQ tablet Take 1 tablet (20 mEq total) by mouth daily.  30 tablet  0  . sodium chloride 0.9 % SOLN 250 mL with vancomycin 10 G SOLR 1,500 mg Inject 1,500 mg into the vein daily.  42 application  0  . Tamsulosin HCl (FLOMAX) 0.4 MG CAPS Take 1 capsule (0.4 mg  total) by mouth daily.  30 capsule  3  . triamcinolone cream (KENALOG) 0.1 % Apply 1 application topically 3 (three) times daily. On back and bottom       No current facility-administered medications on file prior to visit.     Meds ordered this encounter  Medications  . insulin aspart (NOVOLOG) 100 UNIT/ML injection    Sig: Inject into the skin 3 (three) times daily with meals. SSI 200-300   7 UNITS; 301-400  9 UNITS; 401-500   12  UNITS; > 500 14  UNITS AND CALL MD   CBG AC AND QHS    Immunization History  Administered Date(s) Administered  . Influenza Split 02/21/2012, 02/15/2013  . Pneumococcal Polysaccharide-23 02/21/2012    History  Substance Use Topics  . Smoking status: Former Smoker    Quit date: 03/10/1984  . Smokeless tobacco: Never Used  . Alcohol Use: No    Family history is  noncontributory    Review of Systems  DATA OBTAINED: from patient; pt has no c/o GENERAL: Feels well no fevers, fatigue, appetite changes SKIN: No itching, rash or wounds EYES: No eye pain, redness, discharge EARS: No earache, tinnitus, change in hearing NOSE: No congestion, drainage or bleeding  MOUTH/THROAT: No mouth or tooth pain, No sore throat, No difficulty chewing or swallowing  RESPIRATORY: No cough, wheezing, SOB CARDIAC: No chest pain, palpitations, lower extremity edema  GI: No abdominal pain, No N/V/D or constipation, No heartburn or reflux  GU: No dysuria, frequency or urgency, or incontinence  MUSCULOSKELETAL: No unrelieved bone/joint pain;baseline pt has used a WC for 6 months now NEUROLOGIC: No headache, dizziness or focal weakness PSYCHIATRIC: No overt anxiety or sadness. Sleeps well. No behavior issue.   Filed Vitals:   07/05/13 1433  BP: 128/64  Pulse: 96  Temp: 97 F (36.1 C)  Resp: 20    Physical Exam  GENERAL APPEARANCE: Alert, conversant. Appropriately groomed. No acute distress.  SKIN: No diaphoresis rash, or wounds HEAD: Normocephalic, atraumatic  EYES: Conjunctiva/lids clear. Pupils round, reactive. EOMs intact.  EARS: External exam WNL, canals clear. Hearing grossly normal.  NOSE: No deformity or discharge.  MOUTH/THROAT: Lips w/o lesions RESPIRATORY: Breathing is even, unlabored. Lung sounds are clear   CARDIOVASCULAR: Heart RRR no murmurs, rubs or gallops. No peripheral edema.  GASTROINTESTINAL: Abdomen is soft, non-tender, not distended w/ normal bowel sounds. GENITOURINARY: Bladder non tender, not distended  MUSCULOSKELETAL: L foot, ankle, leg in post op dressing NEUROLOGIC: Oriented X3. Cranial nerves 2-12 grossly intact. PSYCHIATRIC: Mood and affect appropriate to situation, no behavioral issues  Patient Active Problem List   Diagnosis Date Noted  . DM (diabetes mellitus), type 2, uncontrolled, periph vascular complic 07/05/2013  .  Chronic renal failure, stage 4 (severe) 07/05/2013  . Anemia of chronic disease 07/05/2013  . Open wound of foot except toes with complication 06/29/2013  . Cellulitis and abscess of foot 06/29/2013  . Hypertension 06/29/2013  . Pulmonary infiltrate 03/22/2013  . Atelectasis 03/22/2013  . Acute on chronic renal failure 03/08/2013  . Hypernatremia 03/08/2013  . Anasarca 03/08/2013  . Physical deconditioning 03/08/2013  . Unable to ambulate 03/08/2013  . Diabetic peripheral neuropathy 03/08/2013  . Bilateral leg edema, L>R 03/08/2013  . Shock 03/08/2013  . History of Bacteremia due to Staphylococcus 03/08/2013  . Tremor 03/08/2013  . Hypokalemia 03/08/2013  . Altered mental status 03/08/2013  . Slurred speech 11/26/2012  . Chronic diastolic CHF (congestive heart failure) 08/04/2011  . Chronic hypertension 08/04/2011  .  Pleural effusion, bilateral 08/04/2011  . DIABETES MELLITUS, WITH VASCULAR COMPLICATIONS 05/26/2010    CBC    Component Value Date/Time   WBC 7.3 07/04/2013 0358   RBC 3.02* 07/04/2013 0358   HGB 8.3* 07/04/2013 0358   HCT 25.1* 07/04/2013 0358   PLT 205 07/04/2013 0358   MCV 83.1 07/04/2013 0358   LYMPHSABS 1.4 06/29/2013 1532   MONOABS 0.7 06/29/2013 1532   EOSABS 0.5 06/29/2013 1532   BASOSABS 0.0 06/29/2013 1532    CMP     Component Value Date/Time   NA 146 07/04/2013 0358   K 4.4 07/04/2013 0358   CL 108 07/04/2013 0358   CO2 24 07/04/2013 0358   GLUCOSE 150* 07/04/2013 0358   BUN 41* 07/04/2013 0358   CREATININE 2.59* 07/04/2013 0358   CALCIUM 8.2* 07/04/2013 0358   PROT 6.7 07/04/2013 0358   ALBUMIN 2.7* 07/04/2013 0358   AST 13 07/04/2013 0358   ALT 7 07/04/2013 0358   ALKPHOS 81 07/04/2013 0358   BILITOT 0.3 07/04/2013 0358   GFRNONAA 24* 07/04/2013 0358   GFRAA 28* 07/04/2013 0358    Assessment and Plan  Cellulitis and abscess of foot With osteomyelitis, debrided in surgery 1/25 with wound left 6.7 cm, X 7.5 cm 100% eschar;R malleolus 0.5cmX 0.2 X 0.1  cm Vancomycin needs to be dosed daily with peak 15-20, levels drawn daily; for 6 week total  Open wound of foot except toes with complication See above ,cellulitis and abscess  DM (diabetes mellitus), type 2, uncontrolled, periph vascular complic HbA1c was 9.7 on hospital admission; Lantus 12 units QHS;  And sliding scale to include the novolog 4 u with meals  Chronic diastolic CHF (congestive heart failure) 2D ECHO - 10/14  EF 55%;  No change in Lasix and  Zaroxolyn and coreg 12.5 BID  Chronic renal failure, stage 4 (severe) Cr on admission was 1.87; went up to 2.63 and is now trending down again;will follow 2/2  Anemia of chronic disease Was 8.3 yesterday and pt declined trasfusion    Margit Hanks, MD

## 2013-07-06 LAB — CULTURE, BLOOD (ROUTINE X 2)
CULTURE: NO GROWTH
Culture: NO GROWTH

## 2013-07-15 ENCOUNTER — Inpatient Hospital Stay (HOSPITAL_COMMUNITY)
Admission: EM | Admit: 2013-07-15 | Discharge: 2013-07-21 | DRG: 853 | Disposition: A | Discharge status: Skilled Nursing Facility | Payer: Medicare Other | Attending: Internal Medicine | Admitting: Internal Medicine

## 2013-07-15 ENCOUNTER — Emergency Department (HOSPITAL_COMMUNITY): Payer: Medicare Other

## 2013-07-15 ENCOUNTER — Encounter (HOSPITAL_COMMUNITY): Payer: Self-pay | Admitting: Emergency Medicine

## 2013-07-15 DIAGNOSIS — Z87891 Personal history of nicotine dependence: Secondary | ICD-10-CM

## 2013-07-15 DIAGNOSIS — I959 Hypotension, unspecified: Secondary | ICD-10-CM

## 2013-07-15 DIAGNOSIS — R918 Other nonspecific abnormal finding of lung field: Secondary | ICD-10-CM

## 2013-07-15 DIAGNOSIS — S91309A Unspecified open wound, unspecified foot, initial encounter: Secondary | ICD-10-CM

## 2013-07-15 DIAGNOSIS — R4182 Altered mental status, unspecified: Secondary | ICD-10-CM

## 2013-07-15 DIAGNOSIS — R600 Localized edema: Secondary | ICD-10-CM

## 2013-07-15 DIAGNOSIS — L02619 Cutaneous abscess of unspecified foot: Secondary | ICD-10-CM

## 2013-07-15 DIAGNOSIS — IMO0002 Reserved for concepts with insufficient information to code with codable children: Secondary | ICD-10-CM

## 2013-07-15 DIAGNOSIS — M869 Osteomyelitis, unspecified: Secondary | ICD-10-CM

## 2013-07-15 DIAGNOSIS — R7881 Bacteremia: Secondary | ICD-10-CM

## 2013-07-15 DIAGNOSIS — G9340 Encephalopathy, unspecified: Secondary | ICD-10-CM | POA: Diagnosis present

## 2013-07-15 DIAGNOSIS — I10 Essential (primary) hypertension: Secondary | ICD-10-CM

## 2013-07-15 DIAGNOSIS — N189 Chronic kidney disease, unspecified: Secondary | ICD-10-CM

## 2013-07-15 DIAGNOSIS — R5381 Other malaise: Secondary | ICD-10-CM | POA: Diagnosis present

## 2013-07-15 DIAGNOSIS — J9811 Atelectasis: Secondary | ICD-10-CM

## 2013-07-15 DIAGNOSIS — E1151 Type 2 diabetes mellitus with diabetic peripheral angiopathy without gangrene: Secondary | ICD-10-CM

## 2013-07-15 DIAGNOSIS — I509 Heart failure, unspecified: Secondary | ICD-10-CM | POA: Diagnosis present

## 2013-07-15 DIAGNOSIS — E1159 Type 2 diabetes mellitus with other circulatory complications: Secondary | ICD-10-CM

## 2013-07-15 DIAGNOSIS — L03119 Cellulitis of unspecified part of limb: Secondary | ICD-10-CM

## 2013-07-15 DIAGNOSIS — A419 Sepsis, unspecified organism: Principal | ICD-10-CM | POA: Diagnosis present

## 2013-07-15 DIAGNOSIS — E1169 Type 2 diabetes mellitus with other specified complication: Secondary | ICD-10-CM | POA: Diagnosis present

## 2013-07-15 DIAGNOSIS — J90 Pleural effusion, not elsewhere classified: Secondary | ICD-10-CM

## 2013-07-15 DIAGNOSIS — E1142 Type 2 diabetes mellitus with diabetic polyneuropathy: Secondary | ICD-10-CM

## 2013-07-15 DIAGNOSIS — D638 Anemia in other chronic diseases classified elsewhere: Secondary | ICD-10-CM

## 2013-07-15 DIAGNOSIS — E872 Acidosis, unspecified: Secondary | ICD-10-CM | POA: Diagnosis present

## 2013-07-15 DIAGNOSIS — R262 Difficulty in walking, not elsewhere classified: Secondary | ICD-10-CM

## 2013-07-15 DIAGNOSIS — E785 Hyperlipidemia, unspecified: Secondary | ICD-10-CM | POA: Diagnosis present

## 2013-07-15 DIAGNOSIS — E119 Type 2 diabetes mellitus without complications: Secondary | ICD-10-CM

## 2013-07-15 DIAGNOSIS — E875 Hyperkalemia: Secondary | ICD-10-CM | POA: Diagnosis present

## 2013-07-15 DIAGNOSIS — E870 Hyperosmolality and hypernatremia: Secondary | ICD-10-CM

## 2013-07-15 DIAGNOSIS — N184 Chronic kidney disease, stage 4 (severe): Secondary | ICD-10-CM | POA: Diagnosis present

## 2013-07-15 DIAGNOSIS — M86679 Other chronic osteomyelitis, unspecified ankle and foot: Secondary | ICD-10-CM | POA: Diagnosis present

## 2013-07-15 DIAGNOSIS — R579 Shock, unspecified: Secondary | ICD-10-CM

## 2013-07-15 DIAGNOSIS — R601 Generalized edema: Secondary | ICD-10-CM

## 2013-07-15 DIAGNOSIS — I129 Hypertensive chronic kidney disease with stage 1 through stage 4 chronic kidney disease, or unspecified chronic kidney disease: Secondary | ICD-10-CM | POA: Diagnosis present

## 2013-07-15 DIAGNOSIS — M9080 Osteopathy in diseases classified elsewhere, unspecified site: Secondary | ICD-10-CM | POA: Diagnosis present

## 2013-07-15 DIAGNOSIS — N390 Urinary tract infection, site not specified: Secondary | ICD-10-CM | POA: Diagnosis present

## 2013-07-15 DIAGNOSIS — I5032 Chronic diastolic (congestive) heart failure: Secondary | ICD-10-CM | POA: Diagnosis present

## 2013-07-15 DIAGNOSIS — N179 Acute kidney failure, unspecified: Secondary | ICD-10-CM | POA: Diagnosis present

## 2013-07-15 DIAGNOSIS — E876 Hypokalemia: Secondary | ICD-10-CM

## 2013-07-15 DIAGNOSIS — R4781 Slurred speech: Secondary | ICD-10-CM

## 2013-07-15 DIAGNOSIS — E1165 Type 2 diabetes mellitus with hyperglycemia: Secondary | ICD-10-CM

## 2013-07-15 DIAGNOSIS — R251 Tremor, unspecified: Secondary | ICD-10-CM

## 2013-07-15 LAB — COMPREHENSIVE METABOLIC PANEL
ALT: <5 U/L (ref 0–53)
AST: 12 U/L (ref 0–37)
Albumin: 2.4 g/dL — ABNORMAL LOW (ref 3.5–5.2)
Alkaline Phosphatase: 64 U/L (ref 39–117)
BUN: 34 mg/dL — ABNORMAL HIGH (ref 6–23)
CALCIUM: 8.3 mg/dL — AB (ref 8.4–10.5)
CO2: 20 meq/L (ref 19–32)
CREATININE: 2.6 mg/dL — AB (ref 0.50–1.35)
Chloride: 108 mEq/L (ref 96–112)
GFR calc non Af Amer: 24 mL/min — ABNORMAL LOW (ref >90)
GFR, EST AFRICAN AMERICAN: 27 mL/min — AB (ref >90)
GLUCOSE: 109 mg/dL — AB (ref 70–99)
Potassium: 5.3 mEq/L (ref 3.7–5.3)
Sodium: 142 mEq/L (ref 137–147)
Total Bilirubin: 0.2 mg/dL — ABNORMAL LOW (ref 0.3–1.2)
Total Protein: 7.7 g/dL (ref 6.0–8.3)

## 2013-07-15 LAB — CBC WITH DIFFERENTIAL/PLATELET
Basophils Absolute: 0 10*3/uL (ref 0.0–0.1)
Basophils Relative: 0 % (ref 0–1)
EOS PCT: 10 % — AB (ref 0–5)
Eosinophils Absolute: 0.9 10*3/uL — ABNORMAL HIGH (ref 0.0–0.7)
HEMATOCRIT: 25.9 % — AB (ref 39.0–52.0)
Hemoglobin: 8.4 g/dL — ABNORMAL LOW (ref 13.0–17.0)
LYMPHS ABS: 0.6 10*3/uL — AB (ref 0.7–4.0)
LYMPHS PCT: 6 % — AB (ref 12–46)
MCH: 27.8 pg (ref 26.0–34.0)
MCHC: 32.4 g/dL (ref 30.0–36.0)
MCV: 85.8 fL (ref 78.0–100.0)
Monocytes Absolute: 1 10*3/uL (ref 0.1–1.0)
Monocytes Relative: 11 % (ref 3–12)
Neutro Abs: 6.6 10*3/uL (ref 1.7–7.7)
Neutrophils Relative %: 73 % (ref 43–77)
Platelets: 266 10*3/uL (ref 150–400)
RBC: 3.02 MIL/uL — AB (ref 4.22–5.81)
RDW: 15.6 % — ABNORMAL HIGH (ref 11.5–15.5)
WBC: 9 10*3/uL (ref 4.0–10.5)

## 2013-07-15 LAB — URINALYSIS, ROUTINE W REFLEX MICROSCOPIC
Bilirubin Urine: NEGATIVE
Glucose, UA: NEGATIVE mg/dL
Ketones, ur: NEGATIVE mg/dL
Nitrite: NEGATIVE
Protein, ur: NEGATIVE mg/dL
SPECIFIC GRAVITY, URINE: 1.012 (ref 1.005–1.030)
UROBILINOGEN UA: 0.2 mg/dL (ref 0.0–1.0)
pH: 5 (ref 5.0–8.0)

## 2013-07-15 LAB — POCT I-STAT 3, VENOUS BLOOD GAS (G3P V)
Acid-base deficit: 3 mmol/L — ABNORMAL HIGH (ref 0.0–2.0)
Bicarbonate: 22.4 mEq/L (ref 20.0–24.0)
O2 Saturation: 63 %
PCO2 VEN: 41 mmHg — AB (ref 45.0–50.0)
TCO2: 24 mmol/L (ref 0–100)
pH, Ven: 7.346 — ABNORMAL HIGH (ref 7.250–7.300)
pO2, Ven: 34 mmHg (ref 30.0–45.0)

## 2013-07-15 LAB — GLUCOSE, CAPILLARY
GLUCOSE-CAPILLARY: 130 mg/dL — AB (ref 70–99)
Glucose-Capillary: 103 mg/dL — ABNORMAL HIGH (ref 70–99)

## 2013-07-15 LAB — URINE MICROSCOPIC-ADD ON

## 2013-07-15 LAB — PRO B NATRIURETIC PEPTIDE: PRO B NATRI PEPTIDE: 3108 pg/mL — AB (ref 0–125)

## 2013-07-15 LAB — CG4 I-STAT (LACTIC ACID): Lactic Acid, Venous: 1.76 mmol/L (ref 0.5–2.2)

## 2013-07-15 LAB — TROPONIN I

## 2013-07-15 MED ORDER — NALOXONE HCL 0.4 MG/ML IJ SOLN
0.4000 mg | Freq: Once | INTRAMUSCULAR | Status: AC
  Administered 2013-07-15: 0.4 mg via INTRAVENOUS

## 2013-07-15 MED ORDER — SODIUM CHLORIDE 0.9 % IJ SOLN
3.0000 mL | Freq: Two times a day (BID) | INTRAMUSCULAR | Status: DC
  Administered 2013-07-15: 3 mL via INTRAVENOUS
  Administered 2013-07-16: 11:00:00 via INTRAVENOUS
  Administered 2013-07-16 – 2013-07-21 (×4): 3 mL via INTRAVENOUS

## 2013-07-15 MED ORDER — ONDANSETRON HCL 4 MG/2ML IJ SOLN
4.0000 mg | Freq: Four times a day (QID) | INTRAMUSCULAR | Status: DC | PRN
  Administered 2013-07-16: 4 mg via INTRAVENOUS
  Filled 2013-07-15: qty 2

## 2013-07-15 MED ORDER — SODIUM CHLORIDE 0.9 % IV SOLN: 1000.0000 mL | INTRAVENOUS | Status: DC

## 2013-07-15 MED ORDER — INSULIN GLARGINE 100 UNIT/ML ~~LOC~~ SOLN
12.0000 [IU] | Freq: Every day | SUBCUTANEOUS | Status: DC
  Administered 2013-07-15: 12 [IU] via SUBCUTANEOUS
  Filled 2013-07-15 (×2): qty 0.12

## 2013-07-15 MED ORDER — VANCOMYCIN HCL IN DEXTROSE 1-5 GM/200ML-% IV SOLN
1000.0000 mg | Freq: Once | INTRAVENOUS | Status: AC
  Administered 2013-07-15: 1000 mg via INTRAVENOUS
  Filled 2013-07-15: qty 200

## 2013-07-15 MED ORDER — TRIAMCINOLONE ACETONIDE 0.1 % EX CREA
1.0000 "application " | TOPICAL_CREAM | Freq: Three times a day (TID) | CUTANEOUS | Status: DC
  Administered 2013-07-15 – 2013-07-21 (×15): 1 via TOPICAL
  Filled 2013-07-15 (×2): qty 15

## 2013-07-15 MED ORDER — SODIUM CHLORIDE 0.9 % IV SOLN
250.0000 mg | Freq: Four times a day (QID) | INTRAVENOUS | Status: DC
  Administered 2013-07-15 – 2013-07-20 (×18): 250 mg via INTRAVENOUS
  Filled 2013-07-15 (×22): qty 250

## 2013-07-15 MED ORDER — TAMSULOSIN HCL 0.4 MG PO CAPS
0.4000 mg | ORAL_CAPSULE | Freq: Every day | ORAL | Status: DC
  Administered 2013-07-17 – 2013-07-21 (×4): 0.4 mg via ORAL
  Filled 2013-07-15 (×6): qty 1

## 2013-07-15 MED ORDER — ENOXAPARIN SODIUM 30 MG/0.3ML ~~LOC~~ SOLN
30.0000 mg | SUBCUTANEOUS | Status: DC
  Administered 2013-07-15 – 2013-07-16 (×2): 30 mg via SUBCUTANEOUS
  Filled 2013-07-15 (×5): qty 0.3

## 2013-07-15 MED ORDER — VANCOMYCIN HCL 10 G IV SOLR
1500.0000 mg | INTRAVENOUS | Status: DC
  Filled 2013-07-15: qty 1500

## 2013-07-15 MED ORDER — NALOXONE HCL 0.4 MG/ML IJ SOLN
INTRAMUSCULAR | Status: AC
  Filled 2013-07-15: qty 1

## 2013-07-15 MED ORDER — PIPERACILLIN-TAZOBACTAM 3.375 G IVPB 30 MIN
3.3750 g | Freq: Once | INTRAVENOUS | Status: AC
  Administered 2013-07-15: 3.375 g via INTRAVENOUS
  Filled 2013-07-15: qty 50

## 2013-07-15 MED ORDER — SODIUM CHLORIDE 0.9 % IV BOLUS (SEPSIS)
500.0000 mL | Freq: Once | INTRAVENOUS | Status: AC
  Administered 2013-07-15: 500 mL via INTRAVENOUS

## 2013-07-15 MED ORDER — SODIUM CHLORIDE 0.9 % IV SOLN: 1000.0000 mL | Freq: Once | INTRAVENOUS | Status: DC

## 2013-07-15 MED ORDER — PANTOPRAZOLE SODIUM 40 MG PO TBEC
40.0000 mg | DELAYED_RELEASE_TABLET | Freq: Every day | ORAL | Status: DC
  Administered 2013-07-17 – 2013-07-21 (×4): 40 mg via ORAL
  Filled 2013-07-15 (×2): qty 1

## 2013-07-15 MED ORDER — ACETAMINOPHEN 325 MG PO TABS: 650.0000 mg | ORAL_TABLET | Freq: Four times a day (QID) | ORAL | Status: DC | PRN

## 2013-07-15 MED ORDER — NALOXONE HCL 0.4 MG/ML IJ SOLN: 0.4000 mg | Freq: Once | INTRAMUSCULAR | Status: DC

## 2013-07-15 MED ORDER — ACETAMINOPHEN 650 MG RE SUPP
650.0000 mg | Freq: Four times a day (QID) | RECTAL | Status: DC | PRN
  Administered 2013-07-16: 650 mg via RECTAL
  Filled 2013-07-15: qty 1

## 2013-07-15 MED ORDER — HYDROCODONE-ACETAMINOPHEN 5-325 MG PO TABS: 1.0000 | ORAL_TABLET | ORAL | Status: DC | PRN

## 2013-07-15 MED ORDER — PRO-STAT SUGAR FREE PO LIQD
30.0000 mL | Freq: Every day | ORAL | Status: DC
  Administered 2013-07-20 – 2013-07-21 (×2): 30 mL via ORAL
  Filled 2013-07-15 (×7): qty 30

## 2013-07-15 MED ORDER — HYDROMORPHONE HCL PF 1 MG/ML IJ SOLN: 1.0000 mg | Freq: Four times a day (QID) | INTRAMUSCULAR | Status: DC | PRN

## 2013-07-15 MED ORDER — SODIUM CHLORIDE 0.9 % IV BOLUS (SEPSIS): 500.0000 mL | Freq: Once | INTRAVENOUS | Status: DC

## 2013-07-15 MED ORDER — ONDANSETRON HCL 4 MG PO TABS: 4.0000 mg | ORAL_TABLET | Freq: Four times a day (QID) | ORAL | Status: DC | PRN

## 2013-07-15 MED ORDER — ALUM & MAG HYDROXIDE-SIMETH 200-200-20 MG/5ML PO SUSP: 30.0000 mL | Freq: Four times a day (QID) | ORAL | Status: DC | PRN

## 2013-07-15 NOTE — ED Provider Notes (Signed)
CSN: [id number]     Arrival date & time 07/15/13  1621 History   First MD Initiated Contact with Patient 07/15/13 1641     Chief Complaint  Patient presents with  . Altered Mental Status  . Fever   (Consider location/radiation/quality/duration/timing/severity/associated sxs/prior Treatment) HPI Comments: [name] presents to the emergency department via EMS for altered mental status. The patient has multiple medical problems to include diabetes, hypertension, hyperlipidemia, CHF, history of pneumonia, chronic kidney disease, osteomyelitis of the left foot. He is currently being treated for osteomyelitis with vancomycin.  Per EMS the patient was found to be unresponsive sometime today in the nursing home by his wife.  Blood pressure was low with systolic in the 80s.  Patient is a 68 y.o. male presenting with altered mental status. The history is provided by the patient.  Altered Mental Status Presenting symptoms: behavior changes, lethargy and unresponsiveness   Presenting symptoms: no combativeness, no confusion, no disorientation, no memory loss and no partial responsiveness   Severity:  Severe Most recent episode:  Today Episode history:  Continuous Duration:  3 hours Timing:  Constant Progression:  Unchanged Context: nursing home resident and recent illness     Past Medical History  Diagnosis Date  . Diabetes mellitus   . Hypertension   . Hyperlipidemia   . CHF (congestive heart failure)   . Enlarged heart   . Pneumonia     hx of   . CKD (chronic kidney disease), stage IV   . Osteomyelitis of foot     LT   Past Surgical History  Procedure Laterality Date  . Right leg fracture    . Skin graft    . Knee surgery    . Tee without cardioversion  03/17/2012    Procedure: TRANSESOPHAGEAL ECHOCARDIOGRAM (TEE);  Surgeon: Ricki Rodriguez, MD;  Location: [facility] ENDOSCOPY;  Service: Cardiovascular;  Laterality: N/A;  . Incision and drainage Left 07/02/2013   Procedure: INCISION AND DRAINAGE LEFT FOOT;  Surgeon: Kathryne Hitch, MD;  Location: [facility] ORS;  Service: Orthopedics;  Laterality: Left;   No family history on file. History  Substance Use Topics  . Smoking status: Former Smoker    Quit date: 03/10/1984  . Smokeless tobacco: Never Used  . Alcohol Use: No    Review of Systems  Unable to perform ROS: Mental status change  Constitutional: Negative.   Psychiatric/Behavioral: Negative for memory loss and confusion.    Allergies  Ancef and Sulfa antibiotics  Home Medications   Current Outpatient Rx  Name  Route  Sig  Dispense  Refill  . Amino Acids-Protein Hydrolys (FEEDING SUPPLEMENT, PRO-STAT SUGAR FREE 64,) LIQD   Oral   Take 30 mLs by mouth daily with breakfast.         . carvedilol (COREG) 12.5 MG tablet   Oral   Take 1 tablet (12.5 mg total) by mouth 2 (two) times daily with a meal.   60 tablet   3   . furosemide (LASIX) 80 MG tablet   Oral   Take 80 mg by mouth every morning.          Marland Kitchen HYDROcodone-acetaminophen (NORCO/VICODIN) 5-325 MG per tablet      Take one tablet by mouth every four hours as needed for pain   180 tablet   0   . insulin aspart (NOVOLOG) 100 UNIT/ML injection   Subcutaneous   Inject into the skin 3 (three) times daily with meals. SSI 200-300   7  UNITS; 301-400  9 UNITS; 401-500   12  UNITS; > 500 14  UNITS AND CALL MD   CBG AC AND QHS         . insulin glargine (LANTUS) 100 UNIT/ML injection   Subcutaneous   Inject 0.12 mLs (12 Units total) into the skin at bedtime.   10 mL   11   . levofloxacin (LEVAQUIN) 750 MG tablet   Oral   Take 1 tablet (750 mg total) by mouth every other day.   21 tablet   0   . Menthol-Methyl Salicylate (MUSCLE RUB) 10-15 % CREA   Topical   Apply 1 application topically as needed (neck and shoulder pain).         Marland Kitchen. metolazone (ZAROXOLYN) 5 MG tablet   Oral   Take 1 tablet (5 mg total) by mouth daily.   30 tablet   3   . Multiple  Vitamins-Minerals (CENTRUM SILVER ADULT 50+ PO)   Oral   Take 0.5 tablets by mouth daily.         Marland Kitchen. omeprazole (PRILOSEC) 20 MG capsule   Oral   Take 20 mg by mouth daily.         . ondansetron (ZOFRAN) 4 MG tablet   Oral   Take 1 tablet (4 mg total) by mouth every 6 (six) hours as needed for nausea.   20 tablet   0   . potassium chloride SA (K-DUR,KLOR-CON) 20 MEQ tablet   Oral   Take 1 tablet (20 mEq total) by mouth daily.   30 tablet   0   . sodium chloride 0.9 % SOLN 150 mL with vancomycin 1000 MG SOLR 750 mg   Intravenous   Inject 750 mg into the vein every 18 (eighteen) hours.         . Tamsulosin HCl (FLOMAX) 0.4 MG CAPS   Oral   Take 1 capsule (0.4 mg total) by mouth daily.   30 capsule   3   . triamcinolone cream (KENALOG) 0.1 %   Topical   Apply 1 application topically 3 (three) times daily. On back and bottom          BP 101/54  Pulse 88  Resp 12  SpO2 99% Physical Exam  Nursing note and vitals reviewed. Constitutional: He appears ill. No distress.  HENT:  Head: Normocephalic.  Mouth/Throat: No oropharyngeal exudate.  Eyes: Conjunctivae are normal.  Neck: Normal range of motion. Neck supple. No JVD present.  Cardiovascular: Normal rate, regular rhythm and intact distal pulses.  Exam reveals no friction rub.   No murmur heard. Pulmonary/Chest: Effort normal. No stridor. He has decreased breath sounds.  Decreased bs diffuse, no increased wob, POx upper 95 or greater on RA  Abdominal: Soft. He exhibits no distension, no pulsatile liver, no fluid wave, no abdominal bruit, no ascites, no pulsatile midline mass and no mass. Bowel sounds are decreased. There is no hepatosplenomegaly. There is no tenderness. There is no CVA tenderness.  Genitourinary: Penis normal.  Musculoskeletal: He exhibits edema.  LLE in splint and wound dressed  Neurological: He is unresponsive.  Skin: Skin is warm and dry. He is not diaphoretic.    ED Course  Procedures  (including critical care time) Labs Review Labs Reviewed  CBC WITH DIFFERENTIAL - Abnormal; Notable for the following:    RBC 3.02 (*)    Hemoglobin 8.4 (*)    HCT 25.9 (*)    RDW 15.6 (*)    Lymphocytes  Relative 6 (*)    Lymphs Abs 0.6 (*)    Eosinophils Relative 10 (*)    Eosinophils Absolute 0.9 (*)    All other components within normal limits  COMPREHENSIVE METABOLIC PANEL - Abnormal; Notable for the following:    Glucose, Bld 109 (*)    BUN 34 (*)    Creatinine, Ser 2.60 (*)    Calcium 8.3 (*)    Albumin 2.4 (*)    Total Bilirubin 0.2 (*)    GFR calc non Af Amer 24 (*)    GFR calc Af Amer 27 (*)    All other components within normal limits  URINALYSIS, ROUTINE W REFLEX MICROSCOPIC - Abnormal; Notable for the following:    APPearance TURBID (*)    Hgb urine dipstick MODERATE (*)    Leukocytes, UA LARGE (*)    All other components within normal limits  PRO B NATRIURETIC PEPTIDE - Abnormal; Notable for the following:    Pro B Natriuretic peptide (BNP) 3108.0 (*)    All other components within normal limits  GLUCOSE, CAPILLARY - Abnormal; Notable for the following:    Glucose-Capillary 103 (*)    All other components within normal limits  URINE MICROSCOPIC-ADD ON - Abnormal; Notable for the following:    Bacteria, UA FEW (*)    All other components within normal limits  POCT I-STAT 3, BLOOD GAS (G3P V) - Abnormal; Notable for the following:    pH, Ven 7.346 (*)    pCO2, Ven 41.0 (*)    Acid-base deficit 3.0 (*)    All other components within normal limits  CULTURE, BLOOD (ROUTINE X 2)  CULTURE, BLOOD (ROUTINE X 2)  URINE CULTURE  TROPONIN I  BLOOD GAS, VENOUS  CG4 I-STAT (LACTIC ACID)   Imaging Review Ct Head Wo Contrast  07/15/2013   CLINICAL DATA:  68 year old male with altered mental status and fever.  EXAM: CT HEAD WITHOUT CONTRAST  TECHNIQUE: Contiguous axial images were obtained from the base of the skull through the vertex without intravenous contrast.   COMPARISON:  03/17/2013 and prior head CTs dating back to 06/26/2011  FINDINGS: Mild atrophy again noted.  No acute intracranial abnormalities are identified, including mass lesion or mass effect, hydrocephalus, extra-axial fluid collection, midline shift, hemorrhage, or acute infarction. The visualized bony calvarium is unremarkable.  IMPRESSION: No evidence of acute intracranial abnormality.  Mild atrophy.   Electronically Signed   By: Laveda Abbe M.D.   On: 07/15/2013 17:33   Dg Chest Port 1 View  (if Code Sepsis Called)  07/15/2013   CLINICAL DATA:  Altered mental status.  Fever.  Sepsis.  EXAM: PORTABLE CHEST - 1 VIEW  COMPARISON:  03/22/2013.  FINDINGS: Interval right jugular catheter with its tip at the junction of the superior vena cava and right atrium. Poor inspiration with no gross change in borderline enlargement of the cardiac silhouette. Interval minimal right pleural effusion. Mild prominence of the interstitial markings without significant change when the decreased inspiration is taken into account. Unremarkable bones.  IMPRESSION: 1. Interval minimal right pleural effusion. 2. Stable borderline cardiomegaly. 3. Stable mild chronic interstitial lung disease.   Electronically Signed   By: Gordan Payment M.D.   On: 07/15/2013 17:09    EKG Interpretation    Date/Time:  Saturday July 15 2013 16:28:47 EST Ventricular Rate:  94 PR Interval:  129 QRS Duration: 96 QT Interval:  358 QTC Calculation: 448 R Axis:   22 Text Interpretation:  Sinus rhythm Probable  left atrial enlargement Posterior infarct, old Repol abnrm suggests ischemia, lateral leads since last tracing no significant change Confirmed by [facility]  MD, ELLIOTT ([id number]) on 07/15/2013 4:42:05 PM            MDM   1. Altered mental status   2. Sepsis   3. Diabetes mellitus   4. Osteomyelitis    68 year old male with multiple medical problems presents emergency bar with altered mental status. Patient was lethargic and  unresponsive upon arrival.  Blood glucose was normal by EMS in the emergency department. Narcan 0.4 mg was given with response as patient was opening his eyes spontaneously and moving his head around. Second dose Narcan ordered. Concern for sepsis as patient was noted to have a rectal temp of 100 and soft blood pressures. Sepsis protocol initiated. Plan for Zosyn and vancomycin. Patient has a documented allergy to Ancef, it caused a rash. Will give Zosyn at this time due to severity of presentation and minimal cross-reactivity. Will also obtain blood work, urine, chest x-ray, CT head. IV fluid bolus of 500 mL normal saline will be given. Patient has a history of CHF so therefore will give boluses of .    After second dose of narcan pt opens eyes spontaneously, moves head from side to side.    6:32 PM Extensive evaluation in the emergency department. Head CT, chest x-ray, blood work, urine. Of note VBG with pH 7.3 CO2 41 and bicarbonate of 22. Lactate 1.7. BNP elevated at 3100. Troponin negative. CBC with anemia with hemoglobin of 8.4. CMP with creatinine of 2.6, glucose 109, BUN 34. Chest x-ray with minimal right-sided pleural effusion and borderline cardiomegaly and chronic interstitial lung disease.  Head CT without acute intracranial abnormality.  Consult internal medicine Dr. Isidoro Donning who will admit the patient. Of note he has significantly improved he is alert, normal speech, able to follow commands, and his vital signs are normal.  CRITICAL CARE Performed by: Redgie Grayer, DAVID   Total critical care time: 45  Critical care time was exclusive of separately billable procedures and treating other patients.  Critical care was necessary to treat or prevent imminent or life-threatening deterioration.  Critical care was time spent personally by me on the following activities: development of treatment plan with patient and/or surrogate as well as nursing, discussions with consultants, evaluation of  patient's response to treatment, examination of patient, obtaining history from patient or surrogate, ordering and performing treatments and interventions, ordering and review of laboratory studies, ordering and review of radiographic studies, pulse oximetry and re-evaluation of patient's condition.   Darlys Gales, MD 07/15/13 [phone]

## 2013-07-15 NOTE — Progress Notes (Signed)
ANTIBIOTIC CONSULT NOTE - INITIAL  Pharmacy Consult for Primaxin/Vanco Indication: UTI, foot osteo  Allergies  Allergen Reactions  . Ancef [Cefazolin] Itching and Rash  . Sulfa Antibiotics Itching and Rash    Patient Measurements:   Adjusted Body Weight:  101.58 kg  Vital Signs: BP: 116/72 mmHg (02/07 1930) Pulse Rate: 94 (02/07 1930) Intake/Output from previous day:   Intake/Output from this shift:    Labs:  Recent Labs  07/15/13 1645  WBC 9.0  HGB 8.4*  PLT 266  CREATININE 2.60*   The CrCl is unknown because both a height and weight (above a minimum accepted value) are required for this calculation. No results found for this basename: VANCOTROUGH, Leodis BinetVANCOPEAK, VANCORANDOM, GENTTROUGH, GENTPEAK, GENTRANDOM, TOBRATROUGH, TOBRAPEAK, TOBRARND, AMIKACINPEAK, AMIKACINTROU, AMIKACIN,  in the last 72 hours   Microbiology: Recent Results (from the past 720 hour(s))  CULTURE, BLOOD (ROUTINE X 2)     Status: None   Collection Time    06/29/13  9:14 PM      Result Value Range Status   Specimen Description BLOOD RIGHT HAND   Final   Special Requests BOTTLES DRAWN AEROBIC ONLY 2ML   Final   Culture  Setup Time     Final   Value: 06/30/2013 01:51     [facility]   Culture     Final   Value: NO GROWTH 5 DAYS     Performed at [facility]   Report Status 07/06/2013 FINAL   Final  CULTURE, BLOOD (ROUTINE X 2)     Status: None   Collection Time    06/29/13  9:25 PM      Result Value Range Status   Specimen Description BLOOD RIGHT HAND   Final   Special Requests BOTTLES DRAWN AEROBIC ONLY 2ML   Final   Culture  Setup Time     Final   Value: 06/30/2013 01:51     Performed at [facility]   Culture     Final   Value: NO GROWTH 5 DAYS     Performed at [facility]   Report Status 07/06/2013 FINAL   Final  URINE CULTURE     Status: None   Collection Time    06/29/13  9:38 PM      Result Value Range Status   Specimen Description  URINE, CLEAN CATCH   Final   Special Requests NONE   Final   Culture  Setup Time     Final   Value: 06/30/2013 03:46     Performed at [facility]   Colony Count     Final   Value: 50,000 COLONIES/ML     Performed at [facility]   Culture     Final   Value: Multiple bacterial morphotypes present, none predominant. Suggest appropriate recollection if clinically indicated.     Performed at [facility]   Report Status 07/01/2013 FINAL   Final  MRSA PCR SCREENING     Status: None   Collection Time    06/30/13  1:04 AM      Result Value Range Status   MRSA by PCR NEGATIVE  NEGATIVE Final   Comment:            The GeneXpert MRSA Assay (FDA     approved for NASAL specimens     only), is one component of a     comprehensive MRSA colonization     surveillance program. It is not  intended to diagnose MRSA     infection nor to guide or     monitor treatment for     MRSA infections.  SURGICAL PCR SCREEN     Status: None   Collection Time    07/02/13  3:54 AM      Result Value Range Status   MRSA, PCR NEGATIVE  NEGATIVE Final   Staphylococcus aureus NEGATIVE  NEGATIVE Final   Comment:            The Xpert SA Assay (FDA     approved for NASAL specimens     in patients over 56 years of age),     is one component of     a comprehensive surveillance     program.  Test performance has     been validated by [employer] for patients greater     than or equal to 22 year old.     It is not intended     to diagnose infection nor to     guide or monitor treatment.    Medical History: Past Medical History  Diagnosis Date  . Diabetes mellitus   . Hypertension   . Hyperlipidemia   . CHF (congestive heart failure)   . Enlarged heart   . Pneumonia     hx of   . CKD (chronic kidney disease), stage IV   . Osteomyelitis of foot     LT    Medications:  Prescriptions prior to admission  Medication Sig Dispense Refill  . Amino Acids-Protein Hydrolys  (FEEDING SUPPLEMENT, PRO-STAT SUGAR FREE 64,) LIQD Take 30 mLs by mouth daily with breakfast.      . carvedilol (COREG) 12.5 MG tablet Take 1 tablet (12.5 mg total) by mouth 2 (two) times daily with a meal.  60 tablet  3  . furosemide (LASIX) 80 MG tablet Take 80 mg by mouth every morning.       Marland Kitchen HYDROcodone-acetaminophen (NORCO/VICODIN) 5-325 MG per tablet Take one tablet by mouth every four hours as needed for pain  180 tablet  0  . insulin aspart (NOVOLOG) 100 UNIT/ML injection Inject into the skin 3 (three) times daily with meals. SSI 200-300   7 UNITS; 301-400  9 UNITS; 401-500   12  UNITS; > 500 14  UNITS AND CALL MD   CBG AC AND QHS      . insulin glargine (LANTUS) 100 UNIT/ML injection Inject 0.12 mLs (12 Units total) into the skin at bedtime.  10 mL  11  . levofloxacin (LEVAQUIN) 750 MG tablet Take 1 tablet (750 mg total) by mouth every other day.  21 tablet  0  . Menthol-Methyl Salicylate (MUSCLE RUB) 10-15 % CREA Apply 1 application topically as needed (neck and shoulder pain).      Marland Kitchen metolazone (ZAROXOLYN) 5 MG tablet Take 1 tablet (5 mg total) by mouth daily.  30 tablet  3  . Multiple Vitamins-Minerals (CENTRUM SILVER ADULT 50+ PO) Take 0.5 tablets by mouth daily.      Marland Kitchen omeprazole (PRILOSEC) 20 MG capsule Take 20 mg by mouth daily.      . ondansetron (ZOFRAN) 4 MG tablet Take 1 tablet (4 mg total) by mouth every 6 (six) hours as needed for nausea.  20 tablet  0  . potassium chloride SA (K-DUR,KLOR-CON) 20 MEQ tablet Take 1 tablet (20 mEq total) by mouth daily.  30 tablet  0  . sodium chloride 0.9 % SOLN 150 mL  with vancomycin 1000 MG SOLR 750 mg Inject 750 mg into the vein every 18 (eighteen) hours.      . Tamsulosin HCl (FLOMAX) 0.4 MG CAPS Take 1 capsule (0.4 mg total) by mouth daily.  30 capsule  3  . triamcinolone cream (KENALOG) 0.1 % Apply 1 application topically 3 (three) times daily. On back and bottom       Assessment: Acute encephalopathy and hypotension  68 y/o AAM with L  foot osteo and abscess is s/p I&D 07/02/13 on Vanco/Levaquin PTA at [facility]. Patient now presents with acute mental status changes, temp 101.3, chills, SBP 80's.  PMH: DM, HTN, HLD, CHF, cardiomegaly, CKD IV, L foot osteo  ID:  L foot osteo and abscess. Can find Vanco doses starting 06/29/13 with plan for 6 weeks abx therapy. Scr 2.6. CrCl 35-40. Last Vanco dose noted today 2/7 on med rec (750mg  at home then 1000mg  in ED?).   Goal of Therapy:  Vancomycin trough level 15-20 mcg/ml  Plan:  Primaxin 250mg  IV q6hrs. Will start Vancomycin 1500mg  IV q24h per [facility] dosing protocol. Vanco trough after 3-5 doses at steady stat.    Alisia Vanengen S. Merilynn Finland, PharmD, BCPS Clinical Staff Pharmacist Pager [phone]  Misty Stanley Stillinger 07/15/2013,8:23 PM

## 2013-07-15 NOTE — ED Notes (Signed)
PT is living [place] for treatment of Osteomyelitis of LT foot. Pt is currently receiving Vanc. 750 mg IV.Staff reports PT was A/O this morning and there was a change in his alertness this after noon per Staff.

## 2013-07-15 NOTE — ED Notes (Signed)
Placed 16 french foley cath into patient cloudy urine in return

## 2013-07-15 NOTE — ED Notes (Signed)
Abnormal labs given to Dr. Redgie GrayerMasneri

## 2013-07-15 NOTE — H&P (Signed)
History and Physical       Hospital Admission Note Date: 07/15/2013  Patient name: Justin Barg Sr. Medical record number: [id number] Date of birth: 08-11-1945 Age: 68 y.o. Gender: male  PCP: [name], MD    Chief Complaint:  Acute encephalopathy with hypotension  HPI: Patient is a [name] with multiple medical problems including diabetes, hypertension, chronic diastolic CHF with left foot osteomyelitis, abscess s/p I&D by Dr. Magnus Ivan on 07/02/2013, on vancomycin and Levaquin, currently at [facility] (discharged on 07/05/13 from [facility])   was brought today for acute mental status changes. History was obtained from the patient's wife in the room who stated that she was visiting him around 3:30 PM today when he found him to be lethargic, had a temp of 101.3. He was having chills. BP was found to be low in 80s. He is usually alert but today was lethargic. In the ER patient was given Narcan 0.4 mg x2 with improvement in his mental status. Patient was also given IV fluid bolus of 500 cc. Lactate 1.7. At the time of my encounter, vital signs had improved, HR 78, RR 16 BP 103/53, O2 sats 96% on room air. Patient was able to follow commands but still confused. UA shows gross UTI, chest x-ray shows no pneumonia and minimal right-sided pleural effusion, borderline cardiomegaly.  Review of Systems:  Unable to obtain from the patient due to his mental status   Past Medical History: Past Medical History  Diagnosis Date  . Diabetes mellitus   . Hypertension   . Hyperlipidemia   . CHF (congestive heart failure)   . Enlarged heart   . Pneumonia     hx of   . CKD (chronic kidney disease), stage IV   . Osteomyelitis of foot     LT   Past Surgical History  Procedure Laterality Date  . Right leg fracture    . Skin graft    . Knee surgery    . Tee without cardioversion  03/17/2012     Procedure: TRANSESOPHAGEAL ECHOCARDIOGRAM (TEE);  Surgeon: Ricki Rodriguez, MD;  Location: [facility] ENDOSCOPY;  Service: Cardiovascular;  Laterality: N/A;  . Incision and drainage Left 07/02/2013    Procedure: INCISION AND DRAINAGE LEFT FOOT;  Surgeon: Kathryne Hitch, MD;  Location: [facility] ORS;  Service: Orthopedics;  Laterality: Left;    Medications: Prior to Admission medications   Medication Sig Start Date End Date Taking? Authorizing Provider  Amino Acids-Protein Hydrolys (FEEDING SUPPLEMENT, PRO-STAT SUGAR FREE 64,) LIQD Take 30 mLs by mouth daily with breakfast.   Yes Historical Provider, MD  carvedilol (COREG) 12.5 MG tablet Take 1 tablet (12.5 mg total) by mouth 2 (two) times daily with a meal. 03/14/13  Yes Robynn Pane, MD  furosemide (LASIX) 80 MG tablet Take 80 mg by mouth every morning.    Yes Historical Provider, MD  HYDROcodone-acetaminophen (NORCO/VICODIN) 5-325 MG per tablet Take one tablet by mouth every four hours as needed for pain 07/05/13  Yes Claudie Revering, NP  insulin aspart (NOVOLOG) 100 UNIT/ML injection Inject into the skin 3 (three) times daily with meals. SSI 200-300   7 UNITS; 301-400  9 UNITS; 401-500   12  UNITS; > 500 14  UNITS AND CALL MD   CBG AC AND QHS 07/04/13  Yes Alison Murray, MD  insulin glargine (LANTUS) 100 UNIT/ML injection Inject 0.12 mLs (12 Units total) into the skin at bedtime. 07/04/13  Yes Alison Murray,  MD  levofloxacin (LEVAQUIN) 750 MG tablet Take 1 tablet (750 mg total) by mouth every other day. 07/04/13  Yes Alison Murray, MD  Menthol-Methyl Salicylate (MUSCLE RUB) 10-15 % CREA Apply 1 application topically as needed (neck and shoulder pain).   Yes Historical Provider, MD  metolazone (ZAROXOLYN) 5 MG tablet Take 1 tablet (5 mg total) by mouth daily. 03/27/13  Yes Robynn Pane, MD  Multiple Vitamins-Minerals (CENTRUM SILVER ADULT 50+ PO) Take 0.5 tablets by mouth daily.   Yes Historical Provider, MD  omeprazole (PRILOSEC) 20 MG capsule Take 20  mg by mouth daily.   Yes Historical Provider, MD  ondansetron (ZOFRAN) 4 MG tablet Take 1 tablet (4 mg total) by mouth every 6 (six) hours as needed for nausea. 07/04/13  Yes Alison Murray, MD  potassium chloride SA (K-DUR,KLOR-CON) 20 MEQ tablet Take 1 tablet (20 mEq total) by mouth daily. 07/04/13  Yes Alison Murray, MD  sodium chloride 0.9 % SOLN 150 mL with vancomycin 1000 MG SOLR 750 mg Inject 750 mg into the vein every 18 (eighteen) hours.   Yes Historical Provider, MD  Tamsulosin HCl (FLOMAX) 0.4 MG CAPS Take 1 capsule (0.4 mg total) by mouth daily. 04/01/12  Yes Robynn Pane, MD  triamcinolone cream (KENALOG) 0.1 % Apply 1 application topically 3 (three) times daily. On back and bottom   Yes Historical Provider, MD    Allergies:   Allergies  Allergen Reactions  . Ancef [Cefazolin] Itching and Rash  . Sulfa Antibiotics Itching and Rash    Social History: Per chart, reports that he quit smoking about 29 years ago. He has never used smokeless tobacco. He reports that he does not drink alcohol or use illicit drugs. He is currently resident of [facility]  Family History: No family history on file.  Physical Exam: Blood pressure 103/53, pulse 78, resp. rate 16, SpO2 96.00%. General: Alert, awake, oriented to self, however was not able to tell me in the month or year, in no acute distress. HEENT: normocephalic, atraumatic, anicteric sclera, pink conjunctiva, pupils equal and reactive to light and accomodation, oropharynx clear Neck: supple, no masses or lymphadenopathy, no goiter, no bruits  Heart: Regular rate and rhythm, without murmurs, rubs or gallops. Lungs: Clear to auscultation bilaterally, no wheezing, rales or rhonchi. Abdomen: Soft, nontender, nondistended, positive bowel sounds, no masses. Extremities: No clubbing, cyanosis, left foot in heel protector and a dressing intact Neuro: Grossly intact, no focal neurological deficits, able to move his  extremities spontaneously Psych: alert and oriented x 3, normal mood and affect Skin: no rashes or lesions, warm and dry   LABS on Admission:  Basic Metabolic Panel:  Recent Labs Lab 07/15/13 1645  NA 142  K 5.3  CL 108  CO2 20  GLUCOSE 109*  BUN 34*  CREATININE 2.60*  CALCIUM 8.3*   Liver Function Tests:  Recent Labs Lab 07/15/13 1645  AST 12  ALT <5  ALKPHOS 64  BILITOT 0.2*  PROT 7.7  ALBUMIN 2.4*   No results found for this basename: LIPASE, AMYLASE,  in the last 168 hours No results found for this basename: AMMONIA,  in the last 168 hours CBC:  Recent Labs Lab 07/15/13 1645  WBC 9.0  NEUTROABS 6.6  HGB 8.4*  HCT 25.9*  MCV 85.8  PLT 266   Cardiac Enzymes:  Recent Labs Lab 07/15/13 1659  TROPONINI <0.30   BNP: No components found with this basename: POCBNP,  CBG:  Recent Labs Lab 07/15/13 1655  GLUCAP 103*     Radiological Exams on Admission: Ct Head Wo Contrast  07/15/2013   CLINICAL DATA:  68 year old male with altered mental status and fever.  EXAM: CT HEAD WITHOUT CONTRAST  TECHNIQUE: Contiguous axial images were obtained from the base of the skull through the vertex without intravenous contrast.  COMPARISON:  03/17/2013 and prior head CTs dating back to 06/26/2011  FINDINGS: Mild atrophy again noted.  No acute intracranial abnormalities are identified, including mass lesion or mass effect, hydrocephalus, extra-axial fluid collection, midline shift, hemorrhage, or acute infarction. The visualized bony calvarium is unremarkable.  IMPRESSION: No evidence of acute intracranial abnormality.  Mild atrophy.   Electronically Signed   By: Laveda Abbe M.D.   On: 07/15/2013 17:33   Mr Foot Left W Wo Contrast  06/30/2013   CLINICAL DATA:  Diabetic patient with a nonhealing ulcer of the heel.  EXAM: MRI OF THE LEFT FOREFOOT WITHOUT AND WITH CONTRAST  TECHNIQUE: Multiplanar, multisequence MR imaging was performed both before and after administration of  intravenous contrast.  CONTRAST:  10 mL MULTIHANCE GADOBENATE DIMEGLUMINE 529 MG/ML IV SOLN  COMPARISON:  None.  FINDINGS: A large skin ulceration is seen over the heel. There is edema and enhancement in the underlying calcaneus consistent osteomyelitis. Edema and enhancement is most prominent in the posterior 1.5 cm of the calcaneus from top-to-bottom. Signal changed extends 4 cm into the calcaneus at the level of the posterior facet of the subtalar joint. There is extensive subcutaneous edema about the ankle compatible with cellulitis. No abscess is identified. Patchy areas of signal abnormality and enhancement are seen in the distal 4 cm of the Achilles tendon and the tendon is thickened. There is no fluid about the Achilles. Visualized tendons about the ankle are otherwise unremarkable. No ligament tear is identified.  IMPRESSION: Cellulitis about the lower leg and with a large ulceration over the heel. Signal abnormality in the calcaneus as described above is consistent with osteomyelitis. No abscess is identified.  Signal abnormality and thickening in the distal Achilles tendon is consistent with tendinosis. Edema and enhancement in the distal 4 cm of the tendon could be due to infection although there is no fluid about the tendon as is typically seen in infectious tenosynovitis.   Electronically Signed   By: Drusilla Kanner M.D.   On: 06/30/2013 07:32   Mr Knee Left  Wo Contrast  06/30/2013   CLINICAL DATA:  Left knee pain and limited range of motion.  EXAM: MRI OF THE LEFT KNEE WITHOUT CONTRAST  TECHNIQUE: Multiplanar, multisequence MR imaging of the knee was performed. No intravenous contrast was administered.  COMPARISON:  None.  FINDINGS: Please note that IV contrast was not administered as the patient could not tolerate further scanning. Patient motion somewhat degrades this exam.  MENISCI  Medial meniscus: There is an oblique tear in the posterior horn of the medial meniscus reaching the meniscal  undersurface. No displaced fragment.  Lateral meniscus:  Intact.  LIGAMENTS  Cruciates:  Intact.  Collaterals:  Intact.  CARTILAGE  Patellofemoral:  Unremarkable.  Medial:  Unremarkable.  Lateral:  Unremarkable.  Joint:  Small joint effusion is present.  Popliteal Fossa:  No Baker cyst.  Extensor Mechanism:  Intact.  Bones: Minimal marrow edema is seen the posterior aspect of medial femoral condyle and medial tibial plateau.  IMPRESSION: Minimal marrow edema in the posterior medial femoral condyle and medial tibial plateau is likely related to degenerative disease. The  appearance of the knee is not suggestive of septic joint or osteomyelitis.  Oblique tear posterior horn medial meniscus.   Electronically Signed   By: Drusilla Kanner M.D.   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 He is in need of durable central venous access for outpatient intravenous antibiotic therapy. He has a history of poor renal function an so placement of a PICC in upper extremity vein is a deferred. We will proceed with placement of a tunneled IJ PICC for outpatient use.  EXAM: IR ULTRASOUND GUIDANCE VASC ACCESS RIGHT; IR RIGHT FLOURO GUIDE CV LINE  TECHNIQUE: The right neck and chest were prepped with chlorhexidine, draped in the usual sterile fashion using maximum barrier technique (cap and mask, sterile gown, sterile gloves, large sterile sheet, hand hygiene and cutaneous antiseptic). Local anesthesia was attained by infiltration with 1% lidocaine.  Ultrasound demonstrated patency of the right internal jugular vein, and this was documented with an image. Under real-time ultrasound guidance, this vein was accessed with a 21 gauge micropuncture needle and image documentation was performed. The needle was exchanged over a guidewire for a peel-away sheath through which a 21 cm 5 [place] dual lumen power injectable PICC was advanced, and positioned with  its tip at the SVC/right atrial junction. Fluoroscopy during the procedure and fluoro spot radiograph confirms appropriate catheter position. The catheter was flushed, secured to the skin with Prolene sutures, and covered with a sterile dressing.  FLUOROSCOPY TIME:  18 seconds  COMPLICATIONS: None.  The patient tolerated the procedure well.  IMPRESSION: Successful placement of a right IJ tunneled dual-lumen power PICC with sonographic and fluoroscopic guidance. The catheter is ready for use.  Signed,  Sterling Big, MD  [facility]  [facility]   Electronically Signed   By: Malachy Moan M.D.   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 He is in need of durable central venous access for outpatient intravenous antibiotic therapy. He has a history of poor renal function an so placement of a PICC in upper extremity vein is a deferred. We will proceed with placement of a tunneled IJ PICC for outpatient use.  EXAM: IR ULTRASOUND GUIDANCE VASC ACCESS RIGHT; IR RIGHT FLOURO GUIDE CV LINE  TECHNIQUE: The right neck and chest were prepped with chlorhexidine, draped in the usual sterile fashion using maximum barrier technique (cap and mask, sterile gown, sterile gloves, large sterile sheet, hand hygiene and cutaneous antiseptic). Local anesthesia was attained by infiltration with 1% lidocaine.  Ultrasound demonstrated patency of the right internal jugular vein, and this was documented with an image. Under real-time ultrasound guidance, this vein was accessed with a 21 gauge micropuncture needle and image documentation was performed. The needle was exchanged over a guidewire for a peel-away sheath through which a 21 cm 5 [place] dual lumen power injectable PICC was advanced, and positioned with its tip at the SVC/right atrial junction. Fluoroscopy during the procedure and fluoro spot  radiograph confirms appropriate catheter position. The catheter was flushed, secured to the skin with Prolene sutures, and covered with a sterile dressing.  FLUOROSCOPY TIME:  18 seconds  COMPLICATIONS: None.  The patient tolerated the procedure well.  IMPRESSION: Successful placement of a right IJ tunneled dual-lumen power PICC with sonographic and fluoroscopic guidance. The catheter is ready for  use.  Signed,  Sterling BigHeath K. McCullough, MD  [facility]  [facility]   Electronically Signed   By: Malachy MoanHeath  McCullough M.D.   On: 07/05/2013 10:17   Dg Chest Port 1 View  (if Code Sepsis Called)  07/15/2013   CLINICAL DATA:  Altered mental status.  Fever.  Sepsis.  EXAM: PORTABLE CHEST - 1 VIEW  COMPARISON:  03/22/2013.  FINDINGS: Interval right jugular catheter with its tip at the junction of the superior vena cava and right atrium. Poor inspiration with no gross change in borderline enlargement of the cardiac silhouette. Interval minimal right pleural effusion. Mild prominence of the interstitial markings without significant change when the decreased inspiration is taken into account. Unremarkable bones.  IMPRESSION: 1. Interval minimal right pleural effusion. 2. Stable borderline cardiomegaly. 3. Stable mild chronic interstitial lung disease.   Electronically Signed   By: Gordan PaymentSteve  Reid M.D.   On: 07/15/2013 17:09   Dg Foot Complete Left  06/29/2013   CLINICAL DATA:  Diabetes.  Left foot pain.  Wound check.  EXAM: LEFT FOOT - COMPLETE 3+ VIEW  COMPARISON:  None.  FINDINGS: Soft tissue wound noted over the calcaneus. No underlying bony abnormality. No foreign body. Diffuse degenerative change present. No evidence of fracture.  IMPRESSION: Soft tissue wound noted over calcaneus. No evidence of bony abnormality. No lytic bony abnormality. No foreign body.   Electronically Signed   By: Maisie Fushomas  Register   On: 06/29/2013 16:26    Assessment/Plan Principal Problem:   Acute  encephalopathy likely due to UTI, patient had presented with SIRS symptoms, osteomyelitis in the left foot - Patient's symptoms have improved from the time of presentation with a fluid bolus in antibiotics, will admit to telemetry, - Given his history of CHF, will not give any further IV fluids. Continue to hold Lasix and metolazone tonight and restart gradually once BP is more stable. - Obtain blood cultures, urine culture, placed on vancomycin and imipenem broad spectrum coverage. Follow urine culture.  Active Problems: Osteomyelitis of the left foot - Patient had recent I&D done by Dr. Magnus IvanBlackman, consult orthopedics if needed  - Patient was on vancomycin and Levaquin prior to admission.    DIABETES MELLITUS, WITH VASCULAR COMPLICATIONS - Placed on Lantus and sliding scale insulin    Chronic diastolic CHF (congestive heart failure) - Currently compensated, hold Lasix and metolazone tonight    Physical deconditioning - Once more stable, will start physical therapy    Chronic renal failure, stage 4 (severe) - Creatinine close to his baseline, we'll continue to monitor    UTI (urinary tract infection) - Follow urine cultures and sensitivities, for now placed on vancomycin and imipenem broad-spectrum coverage  DVT prophylaxis: Lovenox  CODE STATUS: Discussed in detail with patient's wife, she requested for full CODE STATUS  Family Communication: Admission, patients condition and plan of care including tests being ordered have been discussed with the patient and his wife who indicates understanding and agree with the plan and Code Status   Further plan will depend as patient's clinical course evolves and further radiologic and laboratory data become available.   Time Spent on Admission: 1 hour  RAI,RIPUDEEP M.D. [facility] 07/15/2013, 6:49 PM Pager: [phone]  If 7PM-7AM, please contact night-coverage [email or web address] Password [email or web address]

## 2013-07-16 ENCOUNTER — Inpatient Hospital Stay (HOSPITAL_COMMUNITY): Payer: Medicare Other

## 2013-07-16 DIAGNOSIS — N179 Acute kidney failure, unspecified: Secondary | ICD-10-CM

## 2013-07-16 DIAGNOSIS — E119 Type 2 diabetes mellitus without complications: Secondary | ICD-10-CM

## 2013-07-16 DIAGNOSIS — N189 Chronic kidney disease, unspecified: Secondary | ICD-10-CM

## 2013-07-16 DIAGNOSIS — I5032 Chronic diastolic (congestive) heart failure: Secondary | ICD-10-CM

## 2013-07-16 DIAGNOSIS — I509 Heart failure, unspecified: Secondary | ICD-10-CM

## 2013-07-16 LAB — HEMOGLOBIN A1C
Hgb A1c MFr Bld: 8 % — ABNORMAL HIGH (ref <5.7)
Mean Plasma Glucose: 183 mg/dL — ABNORMAL HIGH (ref <117)

## 2013-07-16 LAB — CBC
HCT: 28 % — ABNORMAL LOW (ref 39.0–52.0)
Hemoglobin: 8.9 g/dL — ABNORMAL LOW (ref 13.0–17.0)
MCH: 27.5 pg (ref 26.0–34.0)
MCHC: 31.8 g/dL (ref 30.0–36.0)
MCV: 86.4 fL (ref 78.0–100.0)
PLATELETS: 260 10*3/uL (ref 150–400)
RBC: 3.24 MIL/uL — AB (ref 4.22–5.81)
RDW: 15.9 % — AB (ref 11.5–15.5)
WBC: 8.2 10*3/uL (ref 4.0–10.5)

## 2013-07-16 LAB — BASIC METABOLIC PANEL
BUN: 39 mg/dL — ABNORMAL HIGH (ref 6–23)
BUN: 45 mg/dL — AB (ref 6–23)
CALCIUM: 8 mg/dL — AB (ref 8.4–10.5)
CALCIUM: 7.9 mg/dL — AB (ref 8.4–10.5)
CO2: 17 mEq/L — ABNORMAL LOW (ref 19–32)
CO2: 20 mEq/L (ref 19–32)
CREATININE: 3.14 mg/dL — AB (ref 0.50–1.35)
Chloride: 112 mEq/L (ref 96–112)
Chloride: 109 mEq/L (ref 96–112)
Creatinine, Ser: 3.46 mg/dL — ABNORMAL HIGH (ref 0.50–1.35)
GFR calc Af Amer: 19 mL/min — ABNORMAL LOW (ref >90)
GFR calc non Af Amer: 19 mL/min — ABNORMAL LOW (ref >90)
GFR, EST AFRICAN AMERICAN: 22 mL/min — AB (ref >90)
GFR, EST NON AFRICAN AMERICAN: 17 mL/min — AB (ref >90)
GLUCOSE: 175 mg/dL — AB (ref 70–99)
Glucose, Bld: 135 mg/dL — ABNORMAL HIGH (ref 70–99)
Potassium: 5.4 mEq/L — ABNORMAL HIGH (ref 3.7–5.3)
Potassium: 5.1 mEq/L (ref 3.7–5.3)
SODIUM: 144 meq/L (ref 137–147)
Sodium: 143 mEq/L (ref 137–147)

## 2013-07-16 LAB — GLUCOSE, CAPILLARY
GLUCOSE-CAPILLARY: 168 mg/dL — AB (ref 70–99)
Glucose-Capillary: 129 mg/dL — ABNORMAL HIGH (ref 70–99)
Glucose-Capillary: 153 mg/dL — ABNORMAL HIGH (ref 70–99)
Glucose-Capillary: 165 mg/dL — ABNORMAL HIGH (ref 70–99)

## 2013-07-16 LAB — LACTIC ACID, PLASMA: LACTIC ACID, VENOUS: 2.8 mmol/L — AB (ref 0.5–2.2)

## 2013-07-16 LAB — VANCOMYCIN, RANDOM: Vancomycin Rm: 31 ug/mL

## 2013-07-16 MED ORDER — SODIUM CHLORIDE 0.9 % IJ SOLN
10.0000 mL | INTRAMUSCULAR | Status: DC | PRN
  Administered 2013-07-16: 30 mL
  Administered 2013-07-16 – 2013-07-17 (×3): 10 mL
  Administered 2013-07-17: 20 mL
  Administered 2013-07-18 – 2013-07-21 (×10): 10 mL

## 2013-07-16 MED ORDER — SODIUM BICARBONATE 8.4 % IV SOLN
INTRAVENOUS | Status: DC
  Administered 2013-07-16 (×2): via INTRAVENOUS
  Filled 2013-07-16 (×4): qty 150

## 2013-07-16 MED ORDER — ACETAMINOPHEN 325 MG PO TABS: 650.0000 mg | ORAL_TABLET | ORAL | Status: DC | PRN

## 2013-07-16 MED ORDER — CARVEDILOL 3.125 MG PO TABS
3.1250 mg | ORAL_TABLET | Freq: Two times a day (BID) | ORAL | Status: DC
  Administered 2013-07-18 – 2013-07-21 (×7): 3.125 mg via ORAL
  Filled 2013-07-16 (×12): qty 1

## 2013-07-16 MED ORDER — INSULIN ASPART 100 UNIT/ML ~~LOC~~ SOLN
0.0000 [IU] | Freq: Three times a day (TID) | SUBCUTANEOUS | Status: DC
  Administered 2013-07-16 – 2013-07-17 (×3): 2 [IU] via SUBCUTANEOUS
  Administered 2013-07-19: 1 [IU] via SUBCUTANEOUS
  Administered 2013-07-20 – 2013-07-21 (×4): 2 [IU] via SUBCUTANEOUS

## 2013-07-16 MED ORDER — ACETAMINOPHEN 650 MG RE SUPP
650.0000 mg | RECTAL | Status: DC | PRN
  Administered 2013-07-16: 650 mg via RECTAL
  Filled 2013-07-16: qty 1

## 2013-07-16 NOTE — Progress Notes (Signed)
[facility] PROGRESS NOTE  Justin Saiki Sr. WUJ:[id number] DOB: 12/19/1945 DOA: 07/15/2013 PCP: [name], MD  Assessment/Plan: 1-Acute Encephalopathy: in setting of fever, infection, hypotension. Continue with support care. CT head negative.  2-Acute on Chronic renal failure; in setting of hypotension , sepsis. Cr baseline 1.9. Hold diuretic. Start IV fluids D 5 with Bicarb. Pharmacy to adjust vancomycin for renal failure. Strict I and O. Cr increase to 3.1, BUN at 39.    3-Metabolic acidosis;  likely secondary to renal failure. Will repeat lactic acid. IV  Bicarb Gtt.   4-Sepsis, Hypotension; patient presents with fever, hypotension, tachycardia. ? UTI. Follow up urine culture. I have consulted ortho to follow up on left foot osteomyelitis. Start IV fluids. Hold diuretic. Last Ef at 55 %. Monitor for pulmonary edema. Follow up blood culture.   5-Hyperkalemia; IV bicarb. Repeat renal function this afternoon. If still elevated will order kayexalate.   6-UTI; UA with Too numerous to count WBC. Now on imipenem. Follow up urine culture.   7-Osteomyelitis of the left foot  - Patient had recent I&D done by Dr. Magnus Ivan.  -orthopedic inform of admission.   8-DIABETES MELLITUS, WITH VASCULAR COMPLICATIONS  -sliding scale insulin. Hold lantus due to poor oral intake.    9-Chronic diastolic CHF (congestive heart failure)  - Currently compensated, hold Lasix and metolazone tonight.  10-Abdominal distension; Check KUB.   Code Status: Full Code.  Family Communication: none at bedside.  Disposition Plan: remain inpatient.    Consultants:  ortho  Procedures:  none  Antibiotics:  Primaxin  Vancomycin.   HPI/Subjective: Awake, slow respond. Answer some questions.   Objective: Filed Vitals:   07/16/13 0808  BP: 91/57  Pulse: 87  Temp: 97.8 F (36.6 C)  Resp: 18    Intake/Output Summary (Last 24 hours) at 07/16/13 0854 Last data filed at 07/15/13 2150  Gross  per 24 hour  Intake   1000 ml  Output    600 ml  Net    400 ml   Filed Weights   07/15/13 2024  Weight: 101.424 kg (223 lb 9.6 oz)    Exam:   General:  No distress, awake, slow respond.   Cardiovascular: S 1, S 2 RRR  Respiratory: CTA  Abdomen: Bs present, soft, NT  Musculoskeletal: no edema, left foot with close wound, no significant drainage, still with suture.   Data Reviewed: Basic Metabolic Panel:  Recent Labs Lab 07/15/13 1645 07/16/13 0500  NA 142 144  K 5.3 5.4*  CL 108 112  CO2 20 17*  GLUCOSE 109* 135*  BUN 34* 39*  CREATININE 2.60* 3.14*  CALCIUM 8.3* 8.0*   Liver Function Tests:  Recent Labs Lab 07/15/13 1645  AST 12  ALT <5  ALKPHOS 64  BILITOT 0.2*  PROT 7.7  ALBUMIN 2.4*   No results found for this basename: LIPASE, AMYLASE,  in the last 168 hours No results found for this basename: AMMONIA,  in the last 168 hours CBC:  Recent Labs Lab 07/15/13 1645 07/16/13 0500  WBC 9.0 8.2  NEUTROABS 6.6  --   HGB 8.4* 8.9*  HCT 25.9* 28.0*  MCV 85.8 86.4  PLT 266 260   Cardiac Enzymes:  Recent Labs Lab 07/15/13 1659  TROPONINI <0.30   BNP (last 3 results)  Recent Labs  03/23/13 0450 03/24/13 0415 07/15/13 1659  PROBNP 20208.0* 20835.0* 3108.0*   CBG:  Recent Labs Lab 07/15/13 1655 07/15/13 2146 07/16/13 0743  GLUCAP 103* 130* 129*  No results found for this or any previous visit (from the past 240 hour(s)).   Studies: Ct Head Wo Contrast  07/15/2013   CLINICAL DATA:  68 year old male with altered mental status and fever.  EXAM: CT HEAD WITHOUT CONTRAST  TECHNIQUE: Contiguous axial images were obtained from the base of the skull through the vertex without intravenous contrast.  COMPARISON:  03/17/2013 and prior head CTs dating back to 06/26/2011  FINDINGS: Mild atrophy again noted.  No acute intracranial abnormalities are identified, including mass lesion or mass effect, hydrocephalus, extra-axial fluid collection,  midline shift, hemorrhage, or acute infarction. The visualized bony calvarium is unremarkable.  IMPRESSION: No evidence of acute intracranial abnormality.  Mild atrophy.   Electronically Signed   By: Laveda AbbeJeff  Hu M.D.   On: 07/15/2013 17:33   Dg Chest Port 1 View  (if Code Sepsis Called)  07/15/2013   CLINICAL DATA:  Altered mental status.  Fever.  Sepsis.  EXAM: PORTABLE CHEST - 1 VIEW  COMPARISON:  03/22/2013.  FINDINGS: Interval right jugular catheter with its tip at the junction of the superior vena cava and right atrium. Poor inspiration with no gross change in borderline enlargement of the cardiac silhouette. Interval minimal right pleural effusion. Mild prominence of the interstitial markings without significant change when the decreased inspiration is taken into account. Unremarkable bones.  IMPRESSION: 1. Interval minimal right pleural effusion. 2. Stable borderline cardiomegaly. 3. Stable mild chronic interstitial lung disease.   Electronically Signed   By: Gordan PaymentSteve  Reid M.D.   On: 07/15/2013 17:09    Scheduled Meds: . enoxaparin (LOVENOX) injection  30 mg Subcutaneous Q24H  . feeding supplement (PRO-STAT SUGAR FREE 64)  30 mL Oral Q breakfast  . imipenem-cilastatin  250 mg Intravenous Q6H  . insulin aspart  0-9 Units Subcutaneous TID WC  . naLOXone (NARCAN)  injection  0.4 mg Intravenous Once  . pantoprazole  40 mg Oral Daily  . sodium chloride  500 mL Intravenous Once  . sodium chloride  3 mL Intravenous Q12H  . tamsulosin  0.4 mg Oral Daily  . triamcinolone cream  1 application Topical TID   Continuous Infusions: .  sodium bicarbonate  infusion 1000 mL      Principal Problem:   Acute encephalopathy Active Problems:   DIABETES MELLITUS, WITH VASCULAR COMPLICATIONS   Chronic diastolic CHF (congestive heart failure)   Physical deconditioning   Chronic renal failure, stage 4 (severe)   Hypotension   UTI (urinary tract infection)    Time spent: 35 minutes.      [name]  [facility] Pager [phone]. If 7PM-7AM, please contact night-coverage at [email or web address], password [email or web address] 07/16/2013, 8:54 AM  LOS: 1 day

## 2013-07-16 NOTE — Progress Notes (Signed)
PT Cancellation Note  Patient Details Name: Justin HansenDonald Gellner Sr. MRN: [id number] DOB: 10/05/1945   Cancelled Treatment:    Reason Eval/Treat Not Completed: Medical issues which prohibited therapy  RN reports he had a bad night and to hold PT for today.  Overnight he presented with tachycardia, increased temp, lethargy, and vomiting.  PT to re-attempt eval tomorrow.   Ilda FoilGarrow, Ashtan Laton Rene 07/16/2013, 9:59 AM  Aida RaiderWendy Clyda Smyth, PT  Office # [phone] Pager [phone]

## 2013-07-16 NOTE — Progress Notes (Signed)
Pt had an episode of vomiting x 2 at approximately 2400.. Tachycardia noted. BP 90/65, HR 119 - 140, oral temp 102.7. Tylenol suppository given. Pt became incontinent of stool when turning to place a dressing on sacral wound. A portion of the suppository was expelled. Ice packs applied under the patients arm and at the left and right groin to lower temp. Pt denies pain. NP was notified of tachycardia, vomiting episodes, lethargy and temperature.

## 2013-07-16 NOTE — Progress Notes (Signed)
Pharmacy: Re-vancomycin  A: Patient is a 68 y.o. M on vancomycin PTA for left foot osteo and abscess (s/p I&D on 07/02/13) .  He was on vancomycin 750mg  IV q18hr with last dose given at 9 AM on 07/15/12 ([facility]) and levaquin 750mg  q48h PTA. Vancomycin 1gm IV x1 was given at 5PM while he was in the ED on 07/15/12.  Random vancomycin level now back supratherapeutic at 31.  Plan: 1) Will continue to hold vancomycin for now. 2) recheck another random level in AM and restart vancomycin when level is <20.

## 2013-07-16 NOTE — Progress Notes (Signed)
Left foot heel wound not healing well No active drainage or tissue crepitus Pedal pulses diminished Anticipate further foot/leg surgery this week Will let Dr Magnus IvanBlackman know

## 2013-07-17 LAB — CBC
HEMATOCRIT: 21.8 % — AB (ref 39.0–52.0)
Hemoglobin: 7.2 g/dL — ABNORMAL LOW (ref 13.0–17.0)
MCH: 27.8 pg (ref 26.0–34.0)
MCHC: 33 g/dL (ref 30.0–36.0)
MCV: 84.2 fL (ref 78.0–100.0)
PLATELETS: 219 10*3/uL (ref 150–400)
RBC: 2.59 MIL/uL — ABNORMAL LOW (ref 4.22–5.81)
RDW: 15.3 % (ref 11.5–15.5)
WBC: 7.2 10*3/uL (ref 4.0–10.5)

## 2013-07-17 LAB — URINE CULTURE: Colony Count: >=100000

## 2013-07-17 LAB — GLUCOSE, CAPILLARY
GLUCOSE-CAPILLARY: 158 mg/dL — AB (ref 70–99)
GLUCOSE-CAPILLARY: 127 mg/dL — AB (ref 70–99)
Glucose-Capillary: 140 mg/dL — ABNORMAL HIGH (ref 70–99)
Glucose-Capillary: 192 mg/dL — ABNORMAL HIGH (ref 70–99)

## 2013-07-17 LAB — BASIC METABOLIC PANEL
BUN: 40 mg/dL — AB (ref 6–23)
CALCIUM: 7.8 mg/dL — AB (ref 8.4–10.5)
CO2: 24 mEq/L (ref 19–32)
CREATININE: 3.06 mg/dL — AB (ref 0.50–1.35)
Chloride: 109 mEq/L (ref 96–112)
GFR calc Af Amer: 23 mL/min — ABNORMAL LOW (ref >90)
GFR calc non Af Amer: 19 mL/min — ABNORMAL LOW (ref >90)
GLUCOSE: 136 mg/dL — AB (ref 70–99)
Potassium: 4.6 mEq/L (ref 3.7–5.3)
Sodium: 147 mEq/L (ref 137–147)

## 2013-07-17 LAB — PREPARE RBC (CROSSMATCH)

## 2013-07-17 LAB — VANCOMYCIN, RANDOM: VANCOMYCIN RM: 27.9 ug/mL

## 2013-07-17 MED ORDER — GLUCERNA SHAKE PO LIQD
237.0000 mL | Freq: Three times a day (TID) | ORAL | Status: DC
  Administered 2013-07-17 – 2013-07-21 (×5): 237 mL via ORAL
  Filled 2013-07-17 (×2): qty 237

## 2013-07-17 MED ORDER — SODIUM CHLORIDE 0.9 % IV SOLN
INTRAVENOUS | Status: DC
  Administered 2013-07-17 – 2013-07-18 (×2): via INTRAVENOUS
  Administered 2013-07-18: 100 mL/h via INTRAVENOUS

## 2013-07-17 NOTE — Progress Notes (Signed)
ANTIBIOTIC CONSULT NOTE - FOLLOW UP  Pharmacy Consult for Vancomycin and Primaxin Indication: L foot osteo  Allergies  Allergen Reactions  . Ancef [Cefazolin] Itching and Rash  . Sulfa Antibiotics Itching and Rash    Patient Measurements: Height: 5\' 11"  (180.3 cm) Weight: 223 lb 9.6 oz (101.424 kg) IBW/kg (Calculated) : 75.3  Vital Signs: BP: 97/55 mmHg (02/09 1055) Pulse Rate: 108 (02/09 1055) Intake/Output from previous day: 02/08 0701 - 02/09 0700 In: 3 [I.V.:3] Out: 850 [Urine:850] Intake/Output from this shift: Total I/O In: 360 [P.O.:360] Out: -   Labs:  Recent Labs  07/15/13 1645 07/16/13 0500 07/16/13 1532 07/17/13 0634  WBC 9.0 8.2  --  7.2  HGB 8.4* 8.9*  --  7.2*  PLT 266 260  --  219  CREATININE 2.60* 3.14* 3.46* 3.06*   Estimated Creatinine Clearance: 28 ml/min (by C-G formula based on Cr of 3.06).  Recent Labs  07/16/13 1830 07/17/13 0750  VANCORANDOM 31.0 27.9     Microbiology: Recent Results (from the past 720 hour(s))  CULTURE, BLOOD (ROUTINE X 2)     Status: None   Collection Time    06/29/13  9:14 PM      Result Value Range Status   Specimen Description BLOOD RIGHT HAND   Final   Special Requests BOTTLES DRAWN AEROBIC ONLY   Final   Culture  Setup Time     Final   Value: 06/30/2013 01:51     Performed at [facility]   Culture     Final   Value: NO GROWTH 5 DAYS     Performed at [facility]   Report Status 07/06/2013 FINAL   Final  CULTURE, BLOOD (ROUTINE X 2)     Status: None   Collection Time    06/29/13  9:25 PM      Result Value Range Status   Specimen Description BLOOD RIGHT HAND   Final   Special Requests BOTTLES DRAWN AEROBIC ONLY   Final   Culture  Setup Time     Final   Value: 06/30/2013 01:51     Performed at [facility]   Culture     Final   Value: NO GROWTH 5 DAYS     Performed at [facility]   Report Status 07/06/2013 FINAL   Final  URINE CULTURE     Status: None   Collection Time    06/29/13  9:38 PM      Result Value Range Status   Specimen Description URINE, CLEAN CATCH   Final   Special Requests NONE   Final   Culture  Setup Time     Final   Value: 06/30/2013 03:46     Performed at [facility] Count     Final   Value: 50,000 COLONIES/ML     Performed at [facility]   Culture     Final   Value: Multiple bacterial morphotypes present, none predominant. Suggest appropriate recollection if clinically indicated.     Performed at [facility]   Report Status 07/01/2013 FINAL   Final  MRSA PCR SCREENING     Status: None   Collection Time    06/30/13  1:04 AM      Result Value Range Status   MRSA by PCR NEGATIVE  NEGATIVE Final   Comment:            The GeneXpert MRSA Assay (FDA  approved for NASAL specimens     only), is one component of a     comprehensive MRSA colonization     surveillance program. It is not     intended to diagnose MRSA     infection nor to guide or     monitor treatment for     MRSA infections.  SURGICAL PCR SCREEN     Status: None   Collection Time    07/02/13  3:54 AM      Result Value Range Status   MRSA, PCR NEGATIVE  NEGATIVE Final   Staphylococcus aureus NEGATIVE  NEGATIVE Final   Comment:            The Xpert SA Assay (FDA     approved for NASAL specimens     in patients over 44 years of age),     is one component of     a comprehensive surveillance     program.  Test performance has     been validated by [employer] for patients greater     than or equal to 8 year old.     It is not intended     to diagnose infection nor to     guide or monitor treatment.  CULTURE, BLOOD (ROUTINE X 2)     Status: None   Collection Time    07/15/13  4:44 PM      Result Value Range Status   Specimen Description BLOOD RIGHT HAND   Final   Special Requests BOTTLES DRAWN AEROBIC AND ANAEROBIC 5 CC   Final   Culture  Setup Time     Final   Value: 07/15/2013 21:56      Performed at [facility]   Culture     Final   Value:        BLOOD CULTURE RECEIVED NO GROWTH TO DATE CULTURE WILL BE HELD FOR 5 DAYS BEFORE ISSUING A FINAL NEGATIVE REPORT     Performed at [facility]   Report Status PENDING   Incomplete  CULTURE, BLOOD (ROUTINE X 2)     Status: None   Collection Time    07/15/13  5:00 PM      Result Value Range Status   Specimen Description BLOOD RIGHT ARM   Final   Special Requests BOTTLES DRAWN AEROBIC AND ANAEROBIC 10 BLUE 5 RED   Final   Culture  Setup Time     Final   Value: 07/15/2013 21:57     Performed at [facility]   Culture     Final   Value:        BLOOD CULTURE RECEIVED NO GROWTH TO DATE CULTURE WILL BE HELD FOR 5 DAYS BEFORE ISSUING A FINAL NEGATIVE REPORT     Performed at [facility]   Report Status PENDING   Incomplete  URINE CULTURE     Status: None   Collection Time    07/15/13  5:32 PM      Result Value Range Status   Specimen Description URINE, CATHETERIZED   Final   Special Requests NONE   Final   Culture  Setup Time     Final   Value: 07/16/2013 03:07     Performed at [facility] Count     Final   Value: >=100,000 COLONIES/ML     Performed at [facility]   Culture     Final   Value:  YEAST     [facility]   Report Status 07/17/2013 FINAL   Final  URINE CULTURE     Status: None   Collection Time    07/15/13 10:55 PM      Result Value Range Status   Specimen Description URINE, CATHETERIZED   Final   Special Requests NONE   Final   Culture  Setup Time     Final   Value: 07/16/2013 03:07     [facility]   [place] Count     Final   Value: >=100,000 COLONIES/ML     [facility]   Culture     Final   Value: YEAST     [facility]   Report Status 07/17/2013 FINAL   Final    Anti-infectives   Start     Dose/Rate Route Frequency Ordered Stop   07/16/13 2200  vancomycin  (VANCOCIN) 1,500 mg in sodium chloride 0.9 % 500 mL IVPB  Status:  Discontinued     1,500 mg 250 mL/hr over 120 Minutes Intravenous Every 24 hours 07/15/13 2041 07/16/13 0845   07/15/13 2200  imipenem-cilastatin (PRIMAXIN) 250 mg in sodium chloride 0.9 % 100 mL IVPB     250 mg 200 mL/hr over 30 Minutes Intravenous Every 6 hours 07/15/13 2041     07/15/13 1715  vancomycin (VANCOCIN) IVPB 1000 mg/200 mL premix     1,000 mg 200 mL/hr over 60 Minutes Intravenous  Once 07/15/13 1703 07/15/13 1842   07/15/13 1715  piperacillin-tazobactam (ZOSYN) IVPB 3.375 g     3.375 g 100 mL/hr over 30 Minutes Intravenous  Once 07/15/13 1703 07/15/13 1753      Assessment: 68 yo M on Vancomycin therapy PTA for planned 6 week course for L foot osteo/abscess.  Returns to hospital with AMS, confusion, and hypotension.  Pharmacy consulted to continue IV antibiotics.  Of note, patient was on Vancomycin 750mg  IV q18h with the last dose 2/7 0900.  Pt received additional Vancomycin 1gm on presentation to the ED 2/7 1700.  Random level 2/8 PM=31.  Repeat random level this AM=27.9.  Based on levels, I calculated pt specific ke=0.008 and estimated t1/2 of 86 hours.  Anticipate patient will not need another dose of Vancomycin until 2/10 at midnight when level is calculated to be ~ 20.    Patient's changing renal function is complicating the calculations, with SCr improving from 3.4-->3.06.  Noted baseline SCr ~ 2.6.  Goal of Therapy:  Vancomycin trough level 15-20 mcg/ml Eradication of infection  Plan:  Continue to hold Vancomycin doses. Check Vancomycin level 2/10 with AM labs.  If renal function continues to improve, patient will need additional Vancomycin dosing prior to midnight as calculated. Continue Primaxin 250 mg IV q6h.  Toys 'R' UsKimberly Radames Mejorado, Pharm.D., BCPS Clinical Pharmacist Pager [phone] 07/17/2013 2:31 PM

## 2013-07-17 NOTE — Progress Notes (Signed)
Patient ID: Justin HansenDonald Eklund Sr., male   DOB: 04/25/1946, 68 y.o.   MRN: [id number] Given the continued breakdown of the tissue over his left heel and ankle, he does not show signs of healing this wound.  He will need further surgery with either a more extensive I&D or more likely a below knee amputation.  I spoke to him bout this and will talk with his wife and family later today.  I anticipate taking him to the OR tomorrow afternoon (2/10).  I will put in further orders late today.  He does have a low H/H and will likely need a transfusion prior to surgery.

## 2013-07-17 NOTE — Progress Notes (Signed)
[facility] PROGRESS NOTE  Justin Broad Sr. WUJ:[id number] DOB: [birth year] DOA: 07/15/2013 PCP: [name], MD  Assessment/Plan: 1-Acute Encephalopathy: in setting of fever, infection, hypotension. Continue with support care. CT head negative. Improved.   2-Acute on Chronic renal failure; in setting of hypotension , sepsis. Cr baseline 1.9. Hold diuretic. Change bicarb Gtt to NS. [facility] to adjust vancomycin for renal failure. Strict I and O. Cr at 3.0. Urine out put 750. If no improved might need renal evaluation, prior to surgery. Renal US no hydronephrosis.   3-Metabolic acidosis;  likely secondary to renal failure. Resolved with bicarb gtt.   4-Sepsis, Hypotension; patient presents with fever, hypotension, tachycardia. ? UTI. Follow up urine culture. I have consulted ortho to follow up on left foot osteomyelitis. Start IV fluids. Hold diuretic. Last Ef at 55 %. Monitor for pulmonary edema. blood culture no growth to date.   5-Hyperkalemia; resolved with IV fluids, bicarb.   6-UTI; UA with Too numerous to count WBC. Now on imipenem. Urine culture growing yeast.   7-Osteomyelitis of the left foot  - Patient had recent I&D done by Dr. Magnus Ivan.  -plan for surgery tomorrow. Will need to repeat renal function prior to Sx.   8-DIABETES MELLITUS, WITH VASCULAR COMPLICATIONS  -sliding scale insulin. Hold lantus due to poor oral intake.    9-Chronic diastolic CHF (congestive heart failure)  - Currently compensated, hold Lasix and metolazone tonight.  10-Abdominal distension; KUB negative.  11-Anemia; transfuse one unit of PRBC.   Code Status: Full Code.  Family Communication: none at bedside.  Disposition Plan: remain inpatient.    Consultants:  ortho  Procedures:  none  Antibiotics:  Primaxin  Vancomycin.   HPI/Subjective: Awake, alert. Feeling better. No complaints.   Objective: Filed Vitals:   07/17/13 1548  BP: 118/64  Pulse: 103  Temp: 99.3 F  (37.4 C)  Resp: 18    Intake/Output Summary (Last 24 hours) at 07/17/13 1557 Last data filed at 07/17/13 1500  Gross per 24 hour  Intake    600 ml  Output   1600 ml  Net  -1000 ml   Filed Weights   07/15/13 2024  Weight: 101.424 kg (223 lb 9.6 oz)    Exam:   General:  No distress, awake, slow respond.   Cardiovascular: S 1, S 2 RRR  Respiratory: CTA  Abdomen: Bs present, soft, NT  Musculoskeletal: no edema, left foot with close wound, no significant drainage, still with suture.   Data Reviewed: Basic Metabolic Panel:  Recent Labs Lab 07/15/13 1645 07/16/13 0500 07/16/13 1532 07/17/13 0634  NA 142 144 143 147  K 5.3 5.4* 5.1 4.6  CL 108 112 109 109  CO2 20 17* 20 24  GLUCOSE 109* 135* 175* 136*  BUN 34* 39* 45* 40*  CREATININE 2.60* 3.14* 3.46* 3.06*  CALCIUM 8.3* 8.0* 7.9* 7.8*   Liver Function Tests:  Recent Labs Lab 07/15/13 1645  AST 12  ALT <5  ALKPHOS 64  BILITOT 0.2*  PROT 7.7  ALBUMIN 2.4*   No results found for this basename: LIPASE, AMYLASE,  in the last 168 hours No results found for this basename: AMMONIA,  in the last 168 hours CBC:  Recent Labs Lab 07/15/13 1645 07/16/13 0500 07/17/13 0634  WBC 9.0 8.2 7.2  NEUTROABS 6.6  --   --   HGB 8.4* 8.9* 7.2*  HCT 25.9* 28.0* 21.8*  MCV 85.8 86.4 84.2  PLT 266 260 219   Cardiac Enzymes:  Recent  Labs Lab 07/15/13 1659  TROPONINI <0.30   BNP (last 3 results)  Recent Labs  03/23/13 0450 03/24/13 0415 07/15/13 1659  PROBNP 20208.0* 20835.0* 3108.0*   CBG:  Recent Labs Lab 07/16/13 1207 07/16/13 1644 07/16/13 2105 07/17/13 0829 07/17/13 1224  GLUCAP 153* 168* 165* 140* 158*    Recent Results (from the past 240 hour(s))  CULTURE, BLOOD (ROUTINE X 2)     Status: None   Collection Time    07/15/13  4:44 PM      Result Value Range Status   Specimen Description BLOOD RIGHT HAND   Final   Special Requests BOTTLES DRAWN AEROBIC AND ANAEROBIC 5 CC   Final   Culture   Setup Time     Final   Value: 07/15/2013 21:56     Performed at [facility]   Culture     Final   Value:        BLOOD CULTURE RECEIVED NO GROWTH TO DATE CULTURE WILL BE HELD FOR 5 DAYS BEFORE ISSUING A FINAL NEGATIVE REPORT     Performed at [facility]   Report Status PENDING   Incomplete  CULTURE, BLOOD (ROUTINE X 2)     Status: None   Collection Time    07/15/13  5:00 PM      Result Value Range Status   Specimen Description BLOOD RIGHT ARM   Final   Special Requests BOTTLES DRAWN AEROBIC AND ANAEROBIC 10 BLUE 5 RED   Final   Culture  Setup Time     Final   Value: 07/15/2013 21:57     Performed at [facility]   Culture     Final   Value:        BLOOD CULTURE RECEIVED NO GROWTH TO DATE CULTURE WILL BE HELD FOR 5 DAYS BEFORE ISSUING A FINAL NEGATIVE REPORT     Performed at [facility]   Report Status PENDING   Incomplete  URINE CULTURE     Status: None   Collection Time    07/15/13  5:32 PM      Result Value Range Status   Specimen Description URINE, CATHETERIZED   Final   Special Requests NONE   Final   Culture  Setup Time     Final   Value: 07/16/2013 03:07     Performed at [facility] Count     Final   Value: >=100,000 COLONIES/ML     Performed at [facility]   Culture     Final   Value: YEAST     Performed at [facility]   Report Status 07/17/2013 FINAL   Final  URINE CULTURE     Status: None   Collection Time    07/15/13 10:55 PM      Result Value Range Status   Specimen Description URINE, CATHETERIZED   Final   Special Requests NONE   Final   Culture  Setup Time     Final   Value: 07/16/2013 03:07     Performed at [facility] Count     Final   Value: >=100,000 COLONIES/ML     Performed at [facility]   Culture     Final   Value: YEAST     Performed at [facility]   Report Status 07/17/2013 FINAL   Final     Studies: Dg Abd 1 View  07/16/2013    CLINICAL DATA:  Vomiting, abdominal distention.  EXAM: ABDOMEN - 1 VIEW  COMPARISON:  03/12/2013  FINDINGS: The bowel gas pattern is normal. No radio-opaque calculi or other significant radiographic abnormality are seen. No organomegaly. No acute bony abnormality.  IMPRESSION: No acute findings.   Electronically Signed   By: Charlett Nose M.D.   On: 07/16/2013 17:19   Ct Head Wo Contrast  07/15/2013   CLINICAL DATA:  68 year old male with altered mental status and fever.  EXAM: CT HEAD WITHOUT CONTRAST  TECHNIQUE: Contiguous axial images were obtained from the base of the skull through the vertex without intravenous contrast.  COMPARISON:  03/17/2013 and prior head CTs dating back to 06/26/2011  FINDINGS: Mild atrophy again noted.  No acute intracranial abnormalities are identified, including mass lesion or mass effect, hydrocephalus, extra-axial fluid collection, midline shift, hemorrhage, or acute infarction. The visualized bony calvarium is unremarkable.  IMPRESSION: No evidence of acute intracranial abnormality.  Mild atrophy.   Electronically Signed   By: Laveda Abbe M.D.   On: 07/15/2013 17:33   US Renal  07/16/2013   CLINICAL DATA:  Renal failure  EXAM: RENAL/URINARY TRACT ULTRASOUND COMPLETE  COMPARISON:  March 16, 2012  FINDINGS: Right Kidney:  Length: 11.5 cm. Echogenicity within normal limits. No mass or hydronephrosis visualized.  Left Kidney:  Length: 12 cm. Echogenicity within normal limits. No mass or hydronephrosis visualized.  Bladder:  Decompressed with Foley catheter in place.  IMPRESSION: Normal kidneys.  No hydronephrosis bilaterally.   Electronically Signed   By: Sherian Rein M.D.   On: 07/16/2013 23:11   Dg Chest Port 1 View  (if Code Sepsis Called)  07/15/2013   CLINICAL DATA:  Altered mental status.  Fever.  Sepsis.  EXAM: PORTABLE CHEST - 1 VIEW  COMPARISON:  03/22/2013.  FINDINGS: Interval right jugular catheter with its tip at the junction of the superior vena cava and right atrium.  Poor inspiration with no gross change in borderline enlargement of the cardiac silhouette. Interval minimal right pleural effusion. Mild prominence of the interstitial markings without significant change when the decreased inspiration is taken into account. Unremarkable bones.  IMPRESSION: 1. Interval minimal right pleural effusion. 2. Stable borderline cardiomegaly. 3. Stable mild chronic interstitial lung disease.   Electronically Signed   By: Gordan Payment M.D.   On: 07/15/2013 17:09    Scheduled Meds: . carvedilol  3.125 mg Oral BID WC  . enoxaparin (LOVENOX) injection  30 mg Subcutaneous Q24H  . feeding supplement (GLUCERNA SHAKE)  237 mL Oral TID BM  . feeding supplement (PRO-STAT SUGAR FREE 64)  30 mL Oral Q breakfast  . imipenem-cilastatin  250 mg Intravenous Q6H  . insulin aspart  0-9 Units Subcutaneous TID WC  . naLOXone (NARCAN)  injection  0.4 mg Intravenous Once  . pantoprazole  40 mg Oral Daily  . sodium chloride  500 mL Intravenous Once  . sodium chloride  3 mL Intravenous Q12H  . tamsulosin  0.4 mg Oral Daily  . triamcinolone cream  1 application Topical TID   Continuous Infusions: . sodium chloride 100 mL/hr at 07/17/13 1125    Principal Problem:   Acute encephalopathy Active Problems:   DIABETES MELLITUS, WITH VASCULAR COMPLICATIONS   Chronic diastolic CHF (congestive heart failure)   Physical deconditioning   Chronic renal failure, stage 4 (severe)   Hypotension   UTI (urinary tract infection)    Time spent: 35 minutes.     Thaniel Coluccio  [facility] Pager [phone]. If 7PM-7AM, please contact  night-coverage at [email or web address], password [email or web address] 07/17/2013, 3:57 PM  LOS: 2 days

## 2013-07-17 NOTE — Progress Notes (Signed)
INITIAL NUTRITION ASSESSMENT  DOCUMENTATION CODES Per approved criteria  -Obesity Unspecified   INTERVENTION: Glucerna Shake PO TID, each supplement provides 220 kcal and 10 grams of protein  NUTRITION DIAGNOSIS: Increased nutrient needs related to multiple wounds as evidenced by estimated nutrition needs.   Goal: Intake to meet >90% of estimated nutrition needs.  Monitor:  PO intake, labs, weight trend, wound healing.  Reason for Assessment: MST  68 y.o. male  Admitting Dx: Acute encephalopathy with hypotension  ASSESSMENT: Patient is a [name] with multiple medical problems including diabetes, hypertension, chronic diastolic CHF, left foot osteomyelitis, abscess s/p I&D by Dr. Magnus IvanBlackman on 07/02/2013, [facility] for acute mental status changes with hypotension, lethargy, and fever.  Per review of chart, patient may need a more extensive I&D of left heel and ankle or a below knee amputation. Plans for surgery 2/10. Patient reports he has a poor appetite and has been eating very poorly. Discussed the need for adequate protein and calorie intake to promote healing. Patient agreed to try PO supplement between meals.   Height: Ht Readings from Last 1 Encounters:  07/15/13 5\' 11"  (1.803 m)    Weight: Wt Readings from Last 1 Encounters:  07/15/13 223 lb 9.6 oz (101.424 kg)    Ideal Body Weight: 78.2 kg  % Ideal Body Weight: 130%  Wt Readings from Last 10 Encounters:  07/15/13 223 lb 9.6 oz (101.424 kg)  07/05/13 224 lb 6.9 oz (101.8 kg)  07/05/13 224 lb 6.9 oz (101.8 kg)  04/24/13 209 lb (94.802 kg)  04/21/13 209 lb (94.802 kg)  04/11/13 212 lb (96.163 kg)  03/27/13 223 lb 15.8 oz (101.6 kg)  03/15/13 228 lb 2.8 oz (103.5 kg)  06/06/12 227 lb 4.7 oz (103.1 kg)  05/27/12 238 lb 6.4 oz (108.138 kg)    Usual Body Weight: 223 - 228 lb  % Usual Body Weight:  100%  Weight had trended down last November, but since that time has increased back to usual weight.  BMI:  Body mass index is 31.2 kg/(m^2).  Estimated Nutritional Needs: Kcal: 2300-2500 Protein: 125-140 gm Fluid: 2.3-2.5 L  Skin: stage 2 pressure ulcer on right malleolus; sacral wound (stage 1 last admission); left leg incision  Diet Order: Carb Control with Prostat 30 ml PO once daily with breakfast  EDUCATION NEEDS: -Education needs addressed   Intake/Output Summary (Last 24 hours) at 07/17/13 0913 Last data filed at 07/17/13 0428  Gross per 24 hour  Intake      3 ml  Output    850 ml  Net   -847 ml    Last BM: 2/7   Labs:   Recent Labs Lab 07/16/13 0500 07/16/13 1532 07/17/13 0634  NA 144 143 147  K 5.4* 5.1 4.6  CL 112 109 109  CO2 17* 20 24  BUN 39* 45* 40*  CREATININE 3.14* 3.46* 3.06*  CALCIUM 8.0* 7.9* 7.8*  GLUCOSE 135* 175* 136*    CBG (last 3)   Recent Labs  07/16/13 1644 07/16/13 2105 07/17/13 0829  GLUCAP 168* 165* 140*    Scheduled Meds: . carvedilol  3.125 mg Oral BID WC  . enoxaparin (LOVENOX) injection  30 mg Subcutaneous Q24H  . feeding supplement (PRO-STAT SUGAR FREE 64)  30 mL Oral Q breakfast  . imipenem-cilastatin  250 mg Intravenous Q6H  . insulin aspart  0-9 Units Subcutaneous TID WC  . naLOXone (NARCAN)  injection  0.4 mg Intravenous Once  . pantoprazole  40 mg Oral Daily  . sodium chloride  500 mL Intravenous Once  . sodium chloride  3 mL Intravenous Q12H  . tamsulosin  0.4 mg Oral Daily  . triamcinolone cream  1 application Topical TID    Continuous Infusions: .  sodium bicarbonate  infusion 1000 mL 100 mL/hr at 07/16/13 2234    Past Medical History  Diagnosis Date  . Diabetes mellitus   . Hypertension   . Hyperlipidemia   . CHF (congestive heart failure)   . Enlarged heart   . Pneumonia     hx of   . CKD (chronic kidney disease), stage IV   . Osteomyelitis of foot     LT    Past Surgical History   Procedure Laterality Date  . Right leg fracture    . Skin graft    . Knee surgery    . Tee without cardioversion  03/17/2012    Procedure: TRANSESOPHAGEAL ECHOCARDIOGRAM (TEE);  Surgeon: Ricki Rodriguez, MD;  Location: [facility] ENDOSCOPY;  Service: Cardiovascular;  Laterality: N/A;  . Incision and drainage Left 07/02/2013    Procedure: INCISION AND DRAINAGE LEFT FOOT;  Surgeon: Kathryne Hitch, MD;  Location: [facility] ORS;  Service: Orthopedics;  Laterality: Left;    Joaquin Courts, RD, LDN, CNSC Pager [phone] After Hours Pager [phone]

## 2013-07-17 NOTE — Evaluation (Signed)
Physical Therapy Evaluation Patient Details Name: Justin Michels Sr. MRN: [id number] DOB: [birth year] Today's Date: 07/17/2013 Time: 4098-1191 PT Time Calculation (min): 23 min  PT Assessment / Plan / Recommendation History of Present Illness  68 y.o. male admitted to [facility] on 07/15/13 from [facility] with multiple medical problems including diabetes, hypertension, chronic diastolic CHF with left foot osteomyelitis, abscess s/p I&D by Dr. Magnus Ivan on 07/02/2013.  He comes in this visit with AMS, fever, lethargy.  Dx with acute encepholopathy, metabolic acidosis, sepsis, hypotension, hyperkalemia, osteomyelitis of his left foot, UTI, acute on chronic renal failure, anemia (getting blood transfusion 07/17/13).  He is likely going to require BKA of his left foot this admission.  Ortho following.   Clinical Impression  The pt is very deconditioned and has likely not walked in a long time.  He cannot sit on the side of the bed without support.  He will likely be a poor ambulation candidate if he does end up having an amputation on his left foot.  He is appropriate for return to SNF for rehab post-op and PT will follow him acutely while here in the hospital.      PT Assessment  Patient needs continued PT services    Follow Up Recommendations  SNF    Does the patient have the potential to tolerate intense rehabilitation     NA  Barriers to Discharge   None      Equipment Recommendations  None recommended by PT    Recommendations for Other Services   None  Frequency Min 2X/week    Precautions / Restrictions Precautions Precautions: Fall Restrictions Weight Bearing Restrictions: No LLE Weight Bearing: Non weight bearing Other Position/Activity Restrictions: There are no orders for NWB left foot, but due to significant wound it may be a good thing to try to limit WB on left foot.     Pertinent Vitals/Pain See vitals flow sheet.       Mobility  Bed Mobility Overal bed mobility: Needs  Assistance Bed Mobility: Supine to Sit Supine to sit: Max assist Sit to supine: mod assist.   General bed mobility comments: Max assist to get pt to sitting from supine to move both legs to EOB and to support trunk during transition to sit.  Pt using bed rail to pull up, but doesn't have enough trunk or upper extremity strength to pull up to upright sitting EOB.  Falls to the right and posteriorly when unsupported.  Mod assist needed to lift bil feet back into the bed to get to supine as pt lets gravity drop his trunk down to the bed.        PT Diagnosis: Difficulty walking;Abnormality of gait;Generalized weakness;Acute pain;Hemiplegia dominant side  PT Problem List: Decreased strength;Decreased activity tolerance;Decreased balance;Decreased mobility;Decreased cognition;Decreased knowledge of use of DME;Decreased safety awareness;Decreased knowledge of precautions;Decreased skin integrity;Pain PT Treatment Interventions: DME instruction;Gait training;Functional mobility training;Therapeutic activities;Therapeutic exercise;Balance training;Neuromuscular re-education;Cognitive remediation;Patient/family education;Wheelchair mobility training;Modalities     PT Goals(Current goals can be found in the care plan section) Acute Rehab PT Goals Patient Stated Goal: to get the surgery over with PT Goal Formulation: With patient Time For Goal Achievement: 07/31/13 Potential to Achieve Goals: Fair  Visit Information  Last PT Received On: 07/17/13 Assistance Needed: +2 History of Present Illness: 68 y.o. male admitted to [facility] on 07/15/13 from [facility] SNF with multiple medical problems including diabetes, hypertension, chronic diastolic CHF with left foot osteomyelitis, abscess s/p I&D by Dr. Magnus Ivan on 07/02/2013.  He comes in  this visit with AMS, fever, lethargy.  Dx with acute encepholopathy, metabolic acidosis, sepsis, hypotension, hyperkalemia, osteomyelitis of his left foot, UTI, acute on chronic  renal failure, anemia (getting blood transfusion 07/17/13).  He is likely going to require BKA of his left foot this admission.  Ortho following.        Prior Functioning  Home Living Family/patient expects to be discharged to:: Skilled nursing facility Additional Comments: From [name].  Per pt they have been lifting him into a WC a few times per week either with someone "picking me up" or using a lift machine.  Prior Function Level of Independence: Needs assistance Gait / Transfers Assistance Needed: max assist to transfer, pt could not tell me how long it has been since he has walked.  During his last admission he was unable to stand with two person assist.   ADL's / Homemaking Assistance Needed: total assist.  Communication / Swallowing Assistance Needed: NA Communication Communication: No difficulties    Cognition  Cognition Arousal/Alertness: Awake/alert Behavior During Therapy: WFL for tasks assessed/performed Overall Cognitive Status: Difficult to assess (Pt is slow to process)    Extremity/Trunk Assessment Upper Extremity Assessment Upper Extremity Assessment: Defer to OT evaluation Lower Extremity Assessment Lower Extremity Assessment: RLE deficits/detail;LLE deficits/detail RLE Deficits / Details: right leg with 2+ to 3-/5 strength in sitting  LLE Deficits / Details: dressing and blue heel protector on left foot.  Pt is not as strong in his left foot and has increased generalized edema here.  2 to 2+/5 strength on this leg.  Cervical / Trunk Assessment Cervical / Trunk Assessment: Normal   Balance Balance Overall balance assessment: Needs assistance Sitting-balance support: Bilateral upper extremity supported;Feet supported Sitting balance-Leahy Scale: Poor Sitting balance - Comments: moderate assistance to maintain balance in sitting EOB.  Postural control: Posterior lean;Right lateral lean General Comments General comments (skin integrity, edema, etc.): Pt with very  flaky skin, peeling and dry all over his body.    End of Session PT - End of Session Activity Tolerance: Patient limited by fatigue Patient left: in bed;with call bell/phone within reach;with bed alarm set    Ka Flammer B. Antwyne Pingree, PT, DPT [phone]   07/17/2013, 4:32 PM

## 2013-07-17 NOTE — Evaluation (Signed)
Occupational Therapy Evaluation Patient Details Name: Justin Maglione Sr. MRN: [id number] DOB: [birth year] Today's Date: 07/17/2013 Time: 4098-1191 OT Time Calculation (min): 38 min  OT Assessment / Plan / Recommendation History of present illness 68 y.o. male admitted to [facility] on 07/15/13 from [facility] with multiple medical problems including diabetes, hypertension, chronic diastolic CHF with left foot osteomyelitis, abscess s/p I&D by Dr. Magnus Ivan on 07/02/2013.  He comes in this visit with AMS, fever, lethargy.  Dx with acute encepholopathy, metabolic acidosis, sepsis, hypotension, hyperkalemia, osteomyelitis of his left foot, UTI, acute on chronic renal failure, anemia (getting blood transfusion 07/17/13).  He is likely going to require BKA of his left foot this admission.  Ortho following.    Clinical Impression   Pt presents with impaired static sitting balance for UB selfcare and the need for +2 assistance for LB selfcare.  Currently, unable to transfer to bedside chair without use of lift or at least 2 therapists.  Pt with anticipated further surgery tomorrow which can result in BKA vs I & D.  Feel pt will need extensive acute rehab to help increase independence and reduce burden of care.  Feel pt will also need follow-up SNF for continued rehab as well.        OT Assessment  Patient needs continued OT Services    Follow Up Recommendations  Supervision/Assistance - 24 hour;SNF    Barriers to Discharge Decreased caregiver support          Frequency  Min 1X/week    Precautions / Restrictions Precautions Precautions: Fall Restrictions Weight Bearing Restrictions: No LLE Weight Bearing: Non weight bearing Other Position/Activity Restrictions: There are no orders for NWB left foot, but due to significant wound it may be a good thing to try to limit WB on left foot.     Pertinent Vitals/Pain Pt with faces pain 4/10 in the LLE during session    ADL  Eating/Feeding:  Simulated;Independent Where Assessed - Eating/Feeding: Bed level Grooming: Simulated;Minimal assistance Where Assessed - Grooming: Unsupported sitting Upper Body Bathing: Simulated;Minimal assistance Where Assessed - Upper Body Bathing: Unsupported sitting Lower Body Bathing: Simulated;+1 Total assistance Where Assessed - Lower Body Bathing: Rolling right and/or left;Unsupported sitting;Supine, head of bed flat Upper Body Dressing: Simulated;Moderate assistance Where Assessed - Upper Body Dressing: Unsupported sitting Lower Body Dressing: Simulated;+1 Total assistance Where Assessed - Lower Body Dressing: Rolling right and/or left;Supine, head of bed flat Toileting - Clothing Manipulation and Hygiene: Performed;+1 Total assistance Where Assessed - Toileting Clothing Manipulation and Hygiene: Rolling right and/or left;Supine, head of bed flat Transfers/Ambulation Related to ADLs: Pt did not transfer during session other than to the EOB.  Performed simple scooting up the edge of the bed.  Pt needed total assist to perform this. ADL Comments: Pt needing mod assist for static sitting balance EOB.  Demonstrated frequent LOB posteriorly in sitting, needing mod assist to self correct.  Did not attempt standing but feel sure pt will need total assist.      OT Diagnosis: Generalized weakness;Cognitive deficits;Acute pain  OT Problem List: Decreased strength;Decreased activity tolerance;Impaired balance (sitting and/or standing);Decreased knowledge of use of DME or AE;Pain;Impaired UE functional use OT Treatment Interventions: Self-care/ADL training;Neuromuscular education;DME and/or AE instruction;Therapeutic activities;Visual/perceptual remediation/compensation;Patient/family education;Balance training   OT Goals(Current goals can be found in the care plan section) Acute Rehab OT Goals Patient Stated Goal: I used to walk with the walker but not lately. OT Goal Formulation: With patient Time For Goal  Achievement: 07/31/13 Potential to Achieve Goals:  Fair  Visit Information  Last OT Received On: 07/17/13 Assistance Needed: +2 History of Present Illness: 68 y.o. male [facility] with multiple medical problems including diabetes, hypertension, chronic diastolic CHF with left foot osteomyelitis, abscess s/p I&D by Dr. Magnus IvanBlackman on 07/02/2013.  He comes in this visit with AMS, fever, lethargy.  Dx with acute encepholopathy, metabolic acidosis, sepsis, hypotension, hyperkalemia, osteomyelitis of his left foot, UTI, acute on chronic renal failure, anemia (getting blood transfusion 07/17/13).  He is likely going to require BKA of his left foot this admission.  Ortho following.        Prior Functioning     Home Living Family/patient expects to be discharged to:: Skilled nursing facility Additional Comments: From [facility].  Per pt they have been lifting him into a WC a few times per week either with someone "picking me up" or using a lift machine.  Prior Function Level of Independence: Needs assistance Gait / Transfers Assistance Needed: max assist to transfer, pt could not tell me how long it has been since he has walked.  During his last admission he was unable to stand with two person assist.   ADL's / Homemaking Assistance Needed: total assist.          Vision/Perception Vision - History Baseline Vision: No visual deficits Patient Visual Report: No change from baseline Vision - Assessment Vision Assessment: Vision not tested Perception Perception: Within Functional Limits Praxis Praxis: Intact   Cognition  Cognition Arousal/Alertness: Awake/alert Behavior During Therapy: WFL for tasks assessed/performed Overall Cognitive Status: Difficult to assess (Pt with slow processing, not aware that he was incontinent of bowel when therapist  found him.)    Extremity/Trunk Assessment Upper Extremity Assessment Upper Extremity Assessment: RUE  deficits/detail;LUE deficits/detail RUE Deficits / Details: AROM shoulder flexion 0-90 degrees, all other joints AROM WFLs.  Pt with strength 4/5 in the biceps, triceps, and grip. LUE Deficits / Details: AROM shoulder flexion 0-110 degrees, all other joints AROM WFLs.  Pt with strength 4/5 in the biceps, triceps, and grip. Lower Extremity Assessment Lower Extremity Assessment: Defer to PT evaluation     Mobility Bed Mobility Overal bed mobility: Needs Assistance Bed Mobility: Rolling;Sidelying to Sit;Sit to Supine Rolling: Total assist Sidelying to sit: Total assist Sit to supine: Total assist General bed mobility comments: Pt unable to achieve full rolling to either side without total assist.  Also with total assist to transition trunk to sitting position from supine.        Balance Balance Overall balance assessment: Needs assistance Sitting-balance support: Bilateral upper extremity supported;Feet supported Sitting balance-Leahy Scale: Poor Postural control: Posterior lean General Comments General comments (skin integrity, edema, etc.): Pt with frequent LOB posteriorly while sitting on the edge of the bed.     End of Session OT - End of Session Activity Tolerance: Patient limited by fatigue Patient left: in bed;with call bell/phone within reach Nurse Communication: Mobility status     Xitlaly Ault OTR/L 07/17/2013, 3:55 PM

## 2013-07-18 ENCOUNTER — Encounter (HOSPITAL_COMMUNITY)
Admission: EM | Disposition: A | Discharge status: Skilled Nursing Facility | Payer: Self-pay | Source: Home / Self Care | Attending: Internal Medicine

## 2013-07-18 ENCOUNTER — Inpatient Hospital Stay (HOSPITAL_COMMUNITY): Payer: Medicare Other | Admitting: Anesthesiology

## 2013-07-18 ENCOUNTER — Encounter (HOSPITAL_COMMUNITY): Payer: Self-pay | Admitting: Certified Registered"

## 2013-07-18 ENCOUNTER — Encounter (HOSPITAL_COMMUNITY): Payer: Medicare Other | Admitting: Anesthesiology

## 2013-07-18 HISTORY — PX: AMPUTATION: SHX166

## 2013-07-18 LAB — BASIC METABOLIC PANEL
BUN: 36 mg/dL — ABNORMAL HIGH (ref 6–23)
CALCIUM: 7.7 mg/dL — AB (ref 8.4–10.5)
CO2: 24 mEq/L (ref 19–32)
CREATININE: 2.57 mg/dL — AB (ref 0.50–1.35)
Chloride: 110 mEq/L (ref 96–112)
GFR calc Af Amer: 28 mL/min — ABNORMAL LOW (ref >90)
GFR calc non Af Amer: 24 mL/min — ABNORMAL LOW (ref >90)
Glucose, Bld: 120 mg/dL — ABNORMAL HIGH (ref 70–99)
Potassium: 4.4 mEq/L (ref 3.7–5.3)
Sodium: 147 mEq/L (ref 137–147)

## 2013-07-18 LAB — GLUCOSE, CAPILLARY
GLUCOSE-CAPILLARY: 112 mg/dL — AB (ref 70–99)
Glucose-Capillary: 113 mg/dL — ABNORMAL HIGH (ref 70–99)
Glucose-Capillary: 108 mg/dL — ABNORMAL HIGH (ref 70–99)
Glucose-Capillary: 106 mg/dL — ABNORMAL HIGH (ref 70–99)
Glucose-Capillary: 147 mg/dL — ABNORMAL HIGH (ref 70–99)

## 2013-07-18 LAB — VANCOMYCIN, RANDOM: Vancomycin Rm: 21.8 ug/mL

## 2013-07-18 LAB — CBC
HCT: 25.7 % — ABNORMAL LOW (ref 39.0–52.0)
Hemoglobin: 8.2 g/dL — ABNORMAL LOW (ref 13.0–17.0)
MCH: 26.7 pg (ref 26.0–34.0)
MCHC: 31.9 g/dL (ref 30.0–36.0)
MCV: 83.7 fL (ref 78.0–100.0)
Platelets: 228 10*3/uL (ref 150–400)
RBC: 3.07 MIL/uL — ABNORMAL LOW (ref 4.22–5.81)
RDW: 15.9 % — AB (ref 11.5–15.5)
WBC: 7.2 10*3/uL (ref 4.0–10.5)

## 2013-07-18 LAB — PREPARE RBC (CROSSMATCH)

## 2013-07-18 LAB — HEMOGLOBIN AND HEMATOCRIT, BLOOD
HCT: 25.9 % — ABNORMAL LOW (ref 39.0–52.0)
Hemoglobin: 8.4 g/dL — ABNORMAL LOW (ref 13.0–17.0)

## 2013-07-18 SURGERY — AMPUTATION BELOW KNEE
Anesthesia: General | Laterality: Left

## 2013-07-18 MED ORDER — ARTIFICIAL TEARS OP OINT
TOPICAL_OINTMENT | OPHTHALMIC | Status: DC | PRN
  Administered 2013-07-18: 1 via OPHTHALMIC

## 2013-07-18 MED ORDER — PROPOFOL 10 MG/ML IV BOLUS
INTRAVENOUS | Status: AC
  Filled 2013-07-18: qty 20

## 2013-07-18 MED ORDER — FENTANYL CITRATE 0.05 MG/ML IJ SOLN
INTRAMUSCULAR | Status: AC
  Filled 2013-07-18: qty 2

## 2013-07-18 MED ORDER — PHENYLEPHRINE HCL 10 MG/ML IJ SOLN
INTRAMUSCULAR | Status: DC | PRN
  Administered 2013-07-18: 160 ug via INTRAVENOUS
  Administered 2013-07-18: 40 ug via INTRAVENOUS
  Administered 2013-07-18: 120 ug via INTRAVENOUS
  Administered 2013-07-18 (×2): 40 ug via INTRAVENOUS

## 2013-07-18 MED ORDER — METOCLOPRAMIDE HCL 5 MG/ML IJ SOLN
5.0000 mg | Freq: Three times a day (TID) | INTRAMUSCULAR | Status: DC | PRN
  Filled 2013-07-18: qty 2

## 2013-07-18 MED ORDER — MIDAZOLAM HCL 2 MG/2ML IJ SOLN
INTRAMUSCULAR | Status: AC
  Filled 2013-07-18: qty 2

## 2013-07-18 MED ORDER — FENTANYL CITRATE 0.05 MG/ML IJ SOLN
INTRAMUSCULAR | Status: DC | PRN
  Administered 2013-07-18 (×2): 25 ug via INTRAVENOUS

## 2013-07-18 MED ORDER — LIDOCAINE HCL (CARDIAC) 20 MG/ML IV SOLN
INTRAVENOUS | Status: AC
Start: 2013-07-18 — End: 2013-07-18
  Filled 2013-07-18: qty 10

## 2013-07-18 MED ORDER — FENTANYL CITRATE 0.05 MG/ML IJ SOLN
INTRAMUSCULAR | Status: AC
  Filled 2013-07-18: qty 5

## 2013-07-18 MED ORDER — METHOCARBAMOL 100 MG/ML IJ SOLN
500.0000 mg | Freq: Four times a day (QID) | INTRAMUSCULAR | Status: DC | PRN
  Filled 2013-07-18: qty 5

## 2013-07-18 MED ORDER — ROCURONIUM BROMIDE 50 MG/5ML IV SOLN
INTRAVENOUS | Status: AC
  Filled 2013-07-18: qty 1

## 2013-07-18 MED ORDER — ONDANSETRON HCL 4 MG/2ML IJ SOLN
INTRAMUSCULAR | Status: DC | PRN
  Administered 2013-07-18: 4 mg via INTRAVENOUS

## 2013-07-18 MED ORDER — EPHEDRINE SULFATE 50 MG/ML IJ SOLN
INTRAMUSCULAR | Status: AC
Start: 2013-07-18 — End: 2013-07-18
  Filled 2013-07-18: qty 1

## 2013-07-18 MED ORDER — SODIUM CHLORIDE 0.9 % IJ SOLN
INTRAMUSCULAR | Status: AC
  Filled 2013-07-18: qty 10

## 2013-07-18 MED ORDER — PROMETHAZINE HCL 25 MG/ML IJ SOLN: 6.2500 mg | INTRAMUSCULAR | Status: DC | PRN

## 2013-07-18 MED ORDER — LIDOCAINE HCL (CARDIAC) 20 MG/ML IV SOLN
INTRAVENOUS | Status: AC
  Filled 2013-07-18: qty 5

## 2013-07-18 MED ORDER — FENTANYL CITRATE 0.05 MG/ML IJ SOLN
25.0000 ug | INTRAMUSCULAR | Status: DC | PRN
  Administered 2013-07-18: 50 ug via INTRAVENOUS

## 2013-07-18 MED ORDER — OXYCODONE HCL 5 MG PO TABS
5.0000 mg | ORAL_TABLET | ORAL | Status: DC | PRN
  Administered 2013-07-19: 10 mg via ORAL
  Administered 2013-07-20: 5 mg via ORAL
  Filled 2013-07-18: qty 1
  Filled 2013-07-18: qty 2

## 2013-07-18 MED ORDER — SODIUM CHLORIDE 0.9 % IR SOLN
Status: DC | PRN
  Administered 2013-07-18: 3000 mL

## 2013-07-18 MED ORDER — ONDANSETRON HCL 4 MG PO TABS: 4.0000 mg | ORAL_TABLET | Freq: Four times a day (QID) | ORAL | Status: DC | PRN

## 2013-07-18 MED ORDER — SODIUM CHLORIDE 0.9 % IV SOLN
INTRAVENOUS | Status: DC
  Administered 2013-07-19: 18:00:00 via INTRAVENOUS

## 2013-07-18 MED ORDER — ONDANSETRON HCL 4 MG/2ML IJ SOLN
INTRAMUSCULAR | Status: AC
  Filled 2013-07-18: qty 2

## 2013-07-18 MED ORDER — SODIUM CHLORIDE 0.9 % IV SOLN
INTRAVENOUS | Status: DC | PRN
  Administered 2013-07-18: 15:00:00 via INTRAVENOUS

## 2013-07-18 MED ORDER — VANCOMYCIN HCL IN DEXTROSE 1-5 GM/200ML-% IV SOLN
1000.0000 mg | INTRAVENOUS | Status: DC
  Administered 2013-07-18 – 2013-07-20 (×2): 1000 mg via INTRAVENOUS
  Filled 2013-07-18 (×2): qty 200

## 2013-07-18 MED ORDER — 0.9 % SODIUM CHLORIDE (POUR BTL) OPTIME
TOPICAL | Status: DC | PRN
  Administered 2013-07-18: 1000 mL

## 2013-07-18 MED ORDER — METHOCARBAMOL 500 MG PO TABS
500.0000 mg | ORAL_TABLET | Freq: Four times a day (QID) | ORAL | Status: DC | PRN
Start: 2013-07-18 — End: 2013-07-21
  Filled 2013-07-18: qty 1

## 2013-07-18 MED ORDER — METOCLOPRAMIDE HCL 5 MG PO TABS
5.0000 mg | ORAL_TABLET | Freq: Three times a day (TID) | ORAL | Status: DC | PRN
  Filled 2013-07-18: qty 2

## 2013-07-18 MED ORDER — ONDANSETRON HCL 4 MG/2ML IJ SOLN
4.0000 mg | Freq: Four times a day (QID) | INTRAMUSCULAR | Status: DC | PRN
Start: 2013-07-18 — End: 2013-07-21

## 2013-07-18 MED ORDER — HYDROCODONE-ACETAMINOPHEN 5-325 MG PO TABS
1.0000 | ORAL_TABLET | ORAL | Status: DC | PRN
  Filled 2013-07-18: qty 2

## 2013-07-18 MED ORDER — PHENYLEPHRINE HCL 10 MG/ML IJ SOLN
10.0000 mg | INTRAMUSCULAR | Status: DC | PRN
  Administered 2013-07-18: 10 ug/min via INTRAVENOUS

## 2013-07-18 MED ORDER — MORPHINE SULFATE 2 MG/ML IJ SOLN
1.0000 mg | INTRAMUSCULAR | Status: DC | PRN
  Administered 2013-07-18 – 2013-07-20 (×3): 1 mg via INTRAVENOUS
  Filled 2013-07-18 (×3): qty 1

## 2013-07-18 MED ORDER — DIPHENHYDRAMINE HCL 12.5 MG/5ML PO ELIX
12.5000 mg | ORAL_SOLUTION | ORAL | Status: DC | PRN
  Filled 2013-07-18: qty 10

## 2013-07-18 MED ORDER — PROPOFOL 10 MG/ML IV BOLUS
INTRAVENOUS | Status: DC | PRN
  Administered 2013-07-18: 100 mg via INTRAVENOUS

## 2013-07-18 MED ORDER — LIDOCAINE HCL 1 % IJ SOLN
INTRAMUSCULAR | Status: DC | PRN
  Administered 2013-07-18: 100 mg via INTRADERMAL

## 2013-07-18 SURGICAL SUPPLY — 70 items
BANDAGE ELASTIC 3 VELCRO ST LF (GAUZE/BANDAGES/DRESSINGS) IMPLANT
BANDAGE ESMARK 6X9 LF (GAUZE/BANDAGES/DRESSINGS) IMPLANT
BANDAGE GAUZE ELAST BULKY 4 IN (GAUZE/BANDAGES/DRESSINGS) ×4 IMPLANT
BLADE SAW SGTL MED 73X18.5 STR (BLADE) ×3 IMPLANT
BLADE SURG 10 STRL SS (BLADE) ×5 IMPLANT
BNDG CMPR 9X6 STRL LF SNTH (GAUZE/BANDAGES/DRESSINGS)
BNDG COHESIVE 4X5 TAN STRL (GAUZE/BANDAGES/DRESSINGS) ×3 IMPLANT
BNDG COHESIVE 6X5 TAN STRL LF (GAUZE/BANDAGES/DRESSINGS) ×4 IMPLANT
BNDG ESMARK 6X9 LF (GAUZE/BANDAGES/DRESSINGS)
COVER SURGICAL LIGHT HANDLE (MISCELLANEOUS) ×3 IMPLANT
CUFF TOURNIQUET SINGLE 34IN LL (TOURNIQUET CUFF) ×3 IMPLANT
DRAIN PENROSE 1/2X12 LTX STRL (WOUND CARE) IMPLANT
DRAPE EXTREMITY T 121X128X90 (DRAPE) ×3 IMPLANT
DRAPE ORTHO SPLIT 77X108 STRL (DRAPES) ×6
DRAPE SURG ORHT 6 SPLT 77X108 (DRAPES) ×2 IMPLANT
DRAPE U-SHAPE 47X51 STRL (DRAPES) ×3 IMPLANT
DRSG PAD ABDOMINAL 8X10 ST (GAUZE/BANDAGES/DRESSINGS) ×4 IMPLANT
ELECT CAUTERY BLADE 6.4 (BLADE) IMPLANT
ELECT REM PT RETURN 9FT ADLT (ELECTROSURGICAL)
ELECTRODE REM PT RTRN 9FT ADLT (ELECTROSURGICAL) IMPLANT
EVACUATOR 1/8 PVC DRAIN (DRAIN) IMPLANT
GAUZE XEROFORM 1X8 LF (GAUZE/BANDAGES/DRESSINGS) ×3 IMPLANT
GAUZE XEROFORM 5X9 LF (GAUZE/BANDAGES/DRESSINGS) ×3 IMPLANT
GLOVE BIO SURGEON STRL SZ8 (GLOVE) ×3 IMPLANT
GLOVE BIOGEL PI IND STRL 8 (GLOVE) ×2 IMPLANT
GLOVE BIOGEL PI INDICATOR 8 (GLOVE) ×4
GLOVE ORTHO TXT STRL SZ7.5 (GLOVE) ×7 IMPLANT
GLOVE SURG SS PI 6.0 STRL IVOR (GLOVE) ×2 IMPLANT
GOWN STRL REUS W/ TWL LRG LVL3 (GOWN DISPOSABLE) ×1 IMPLANT
GOWN STRL REUS W/ TWL XL LVL3 (GOWN DISPOSABLE) ×4 IMPLANT
GOWN STRL REUS W/TWL LRG LVL3 (GOWN DISPOSABLE) ×3
GOWN STRL REUS W/TWL XL LVL3 (GOWN DISPOSABLE) ×12
HANDPIECE INTERPULSE COAX TIP (DISPOSABLE) ×3
KIT BASIN OR (CUSTOM PROCEDURE TRAY) ×3 IMPLANT
KIT ROOM TURNOVER OR (KITS) ×3 IMPLANT
MANIFOLD NEPTUNE II (INSTRUMENTS) ×3 IMPLANT
NS IRRIG 1000ML POUR BTL (IV SOLUTION) ×3 IMPLANT
PACK GENERAL/GYN (CUSTOM PROCEDURE TRAY) ×3 IMPLANT
PACK ORTHO EXTREMITY (CUSTOM PROCEDURE TRAY) ×3 IMPLANT
PAD ABD 8X10 STRL (GAUZE/BANDAGES/DRESSINGS) ×2 IMPLANT
PAD ARMBOARD 7.5X6 YLW CONV (MISCELLANEOUS) ×6 IMPLANT
PADDING CAST ABS 4INX4YD NS (CAST SUPPLIES) ×4
PADDING CAST ABS 6INX4YD NS (CAST SUPPLIES) ×2
PADDING CAST ABS COTTON 4X4 ST (CAST SUPPLIES) ×2 IMPLANT
PADDING CAST ABS COTTON 6X4 NS (CAST SUPPLIES) IMPLANT
PADDING CAST COTTON 6X4 STRL (CAST SUPPLIES) ×5 IMPLANT
SET HNDPC FAN SPRY TIP SCT (DISPOSABLE) IMPLANT
SPONGE GAUZE 4X4 12PLY (GAUZE/BANDAGES/DRESSINGS) ×3 IMPLANT
SPONGE LAP 18X18 X RAY DECT (DISPOSABLE) ×5 IMPLANT
STAPLER VISISTAT 35W (STAPLE) IMPLANT
STOCKINETTE IMPERVIOUS 9X36 MD (GAUZE/BANDAGES/DRESSINGS) ×3 IMPLANT
STOCKINETTE IMPERVIOUS LG (DRAPES) ×2 IMPLANT
SUT ETHILON 2 0 FS 18 (SUTURE) ×21 IMPLANT
SUT ETHILON 2 0 PSLX (SUTURE) IMPLANT
SUT ETHILON 3 0 PS 1 (SUTURE) ×6 IMPLANT
SUT SILK 2 0 (SUTURE)
SUT SILK 2 0 SH CR/8 (SUTURE) ×3 IMPLANT
SUT SILK 2-0 18XBRD TIE 12 (SUTURE) IMPLANT
SUT VIC AB 0 CT1 27 (SUTURE) ×9
SUT VIC AB 0 CT1 27XBRD ANBCTR (SUTURE) ×2 IMPLANT
SUT VIC AB 2-0 CT1 27 (SUTURE) ×15
SUT VIC AB 2-0 CT1 TAPERPNT 27 (SUTURE) ×2 IMPLANT
TOWEL OR 17X24 6PK STRL BLUE (TOWEL DISPOSABLE) ×3 IMPLANT
TOWEL OR 17X26 10 PK STRL BLUE (TOWEL DISPOSABLE) ×3 IMPLANT
TUBE ANAEROBIC SPECIMEN COL (MISCELLANEOUS) IMPLANT
TUBE CONNECTING 12'X1/4 (SUCTIONS) ×1
TUBE CONNECTING 12X1/4 (SUCTIONS) ×2 IMPLANT
UNDERPAD 30X30 INCONTINENT (UNDERPADS AND DIAPERS) ×3 IMPLANT
WATER STERILE IRR 1000ML POUR (IV SOLUTION) ×3 IMPLANT
YANKAUER SUCT BULB TIP NO VENT (SUCTIONS) ×3 IMPLANT

## 2013-07-18 NOTE — Progress Notes (Signed)
Patient ID: Justin HansenDonald Rosser Sr., male   DOB: 04/28/1946, 68 y.o.   MRN: [id number] I have spoken to [name] in great detail as well as his family at the bedside today.  He has a significantly necrosed wound with deep infection on the back of his left foot and ankle.  Due to the severity of this wound, I am now recommending a left below knee amputation.  He is a very sick individual and I explained the operative as well as non-operative risks in great detail.  He agrees at this point to proceed with surgery given the gravity of his situation.  His family has seen his foot foot up close as well and understand the reasoning behind surgery.

## 2013-07-18 NOTE — Anesthesia Preprocedure Evaluation (Signed)
Anesthesia Evaluation  Patient identified by MRN, date of birth, ID band Patient awake    Reviewed: Allergy & Precautions, H&P , NPO status , Patient's Chart, lab work & pertinent test results  Airway Mallampati: II  Neck ROM: Full    Dental  (+) Poor Dentition, Missing and Dental Advisory Given   Pulmonary pneumonia -, former smoker,  breath sounds clear to auscultation  Pulmonary exam normal       Cardiovascular hypertension, Pt. on medications and Pt. on home beta blockers + Peripheral Vascular Disease and +CHF Rhythm:Regular Rate:Normal     Neuro/Psych  Neuromuscular disease negative neurological ROS  negative psych ROS   GI/Hepatic negative GI ROS, Neg liver ROS,   Endo/Other  negative endocrine ROSdiabetes, Poorly Controlled, Type 2, Insulin Dependent  Renal/GU ARFRenal disease     Musculoskeletal negative musculoskeletal ROS (+)   Abdominal   Peds  Hematology negative hematology ROS (+) anemia ,   Anesthesia Other Findings Patient denied loose teeth at time of preoperative exam however at time of induction patient noted to have extremely loose upper front incisors prior to placement of LMA.  Reproductive/Obstetrics                           Anesthesia Physical Anesthesia Plan  ASA: IV  Anesthesia Plan: General   Post-op Pain Management:    Induction: Intravenous  Airway Management Planned: LMA and Oral ETT  Additional Equipment:   Intra-op Plan:   Post-operative Plan: Extubation in OR  Informed Consent: I have reviewed the patients History and Physical, chart, labs and discussed the procedure including the risks, benefits and alternatives for the proposed anesthesia with the patient or authorized representative who has indicated his/her understanding and acceptance.   Dental advisory given  Plan Discussed with: CRNA and Surgeon  Anesthesia Plan Comments:          Anesthesia Quick Evaluation

## 2013-07-18 NOTE — Transfer of Care (Signed)
Immediate Anesthesia Transfer of Care Note  Patient: Justin Hansenonald Gitto Sr.  Procedure(s) Performed: Procedure(s): AMPUTATION BELOW KNEE (Left)  Patient Location: PACU  Anesthesia Type:General  Level of Consciousness: awake, alert  and confused  Airway & Oxygen Therapy: Patient Spontanous Breathing and Patient connected to nasal cannula oxygen  Post-op Assessment: Report given to PACU RN and Post -op Vital signs reviewed and stable  Post vital signs: Reviewed and stable  Complications: No apparent anesthesia complications

## 2013-07-18 NOTE — Care Management Note (Signed)
    Page 1 of 1   07/19/2013     10:16:30 AM   CARE MANAGEMENT NOTE 07/19/2013  Patient:  Justin HansenMARSHALL,Kaseem   Account Number:  [id number]  Date Initiated:  07/18/2013  Documentation initiated by:  GRAVES-BIGELOW,Khylie Larmore  Subjective/Objective Assessment:   Pt admitted for Acute encephalopathy with hypotension. Hx chronic diastolic CHF with left foot osteomyelitis. Patient had recent I&D done by Dr. Magnus IvanBlackman.  -plan for surgery 07-18-13.     Action/Plan:   CM will conitnue to monitor for disposition needs.   Anticipated DC Date:  07/21/2013   Anticipated DC Plan:  SKILLED NURSING FACILITY  In-house referral  Clinical Social Worker      DC Planning Services  CM consult      Choice offered to / List presented to:             Status of service:  Completed, signed off Medicare Important Message given?   (If response is "NO", the following Medicare IM given date fields will be blank) Date Medicare IM given:   Date Additional Medicare IM given:    Discharge Disposition:  SKILLED NURSING FACILITY  Per UR Regulation:  Reviewed for med. necessity/level of care/duration of stay  If discussed at Long Length of Stay Meetings, dates discussed:   07/20/2013    Comments:  07-18-13 1013 Tomi BambergerBrenda Graves-Bigelow, RN, BSN [phone] 1 Day Post-Op Procedure(s):AMPUTATION BELOW KNEE (Left. Per MD notes once medically stable [facility]. CSW to assist with disposition needs.

## 2013-07-18 NOTE — Anesthesia Postprocedure Evaluation (Signed)
  Anesthesia Post-op Note  Patient: Justin Hansenonald Ponder Sr.  Procedure(s) Performed: Procedure(s): AMPUTATION BELOW KNEE (Left)  Patient Location: PACU  Anesthesia Type:General  Level of Consciousness: awake, oriented, sedated and patient cooperative  Airway and Oxygen Therapy: Patient Spontanous Breathing  Post-op Pain: mild  Post-op Assessment: Post-op Vital signs reviewed, Patient's Cardiovascular Status Stable, Respiratory Function Stable, Patent Airway, No signs of Nausea or vomiting and Pain level controlled  Post-op Vital Signs: stable  Complications: No apparent anesthesia complications

## 2013-07-18 NOTE — Preoperative (Signed)
Beta Blockers   Reason not to administer Beta Blockers:Hold beta blocker due to hypotension ([facility]).

## 2013-07-18 NOTE — Brief Op Note (Signed)
07/15/2013 - 07/18/2013  4:17 PM  PATIENT:  Justin Hansenonald Lavalley Sr.  68 y.o. male  PRE-OPERATIVE DIAGNOSIS:  Left foot wound/infection  POST-OPERATIVE DIAGNOSIS:  Left foot wound/infection  PROCEDURE:  Procedure(s): AMPUTATION BELOW KNEE (Left)  SURGEON:  Surgeon(s) and Role:    * Kathryne Hitchhristopher Y Shalese Strahan, MD - Primary  PHYSICIAN ASSISTANT: Rexene EdisonGil Clark, PA-C  ANESTHESIA:   general  EBL:  Total I/O In: 250 [I.V.:250] Out: 130 [Urine:30; Blood:100]  BLOOD ADMINISTERED:none  DRAINS: none   LOCAL MEDICATIONS USED:  NONE  SPECIMEN:  No Specimen  DISPOSITION OF SPECIMEN:  N/A  COUNTS:  YES  TOURNIQUET:   Total Tourniquet Time Documented: Thigh (Left) - 20 minutes Total: Thigh (Left) - 20 minutes   DICTATION: .Other Dictation: Dictation Number [id number]  PLAN OF CARE: Admit to inpatient   PATIENT DISPOSITION:  PACU - hemodynamically stable.   Delay start of Pharmacological VTE agent (>24hrs) due to surgical blood loss or risk of bleeding: no

## 2013-07-18 NOTE — Progress Notes (Signed)
ANTIBIOTIC CONSULT NOTE - FOLLOW UP  Pharmacy Consult for Vancomycin and Primaxin Indication: L foot osteo  Allergies  Allergen Reactions  . Ancef [Cefazolin] Itching and Rash  . Sulfa Antibiotics Itching and Rash    Patient Measurements: Height: 5\' 11"  (180.3 cm) Weight: 223 lb 9.6 oz (101.424 kg) IBW/kg (Calculated) : 75.3  Vital Signs: Temp: 98.2 F (36.8 C) (02/10 0423) Temp src: Oral (02/10 0423) BP: 136/62 mmHg (02/10 0423) Pulse Rate: 99 (02/10 0423) Intake/Output from previous day: 02/09 0701 - 02/10 0700 In: 925 [P.O.:600; Blood:325] Out: 1750 [Urine:1750] Intake/Output from this shift:    Labs:  Recent Labs  07/16/13 0500 07/16/13 1532 07/17/13 0634 07/18/13 0600  WBC 8.2  --  7.2 7.2  HGB 8.9*  --  7.2* 8.2*  PLT 260  --  219 228  CREATININE 3.14* 3.46* 3.06* 2.57*   Estimated Creatinine Clearance: 33.3 ml/min (by C-G formula based on Cr of 2.57).  Recent Labs  07/17/13 0750 07/18/13 0600  VANCORANDOM 27.9 21.8     Microbiology: Recent Results (from the past 720 hour(s))  CULTURE, BLOOD (ROUTINE X 2)     Status: None   Collection Time    06/29/13  9:14 PM      Result Value Range Status   Specimen Description BLOOD RIGHT HAND   Final   Special Requests BOTTLES DRAWN AEROBIC ONLY 2ML   Final   Culture  Setup Time     Final   Value: 06/30/2013 01:51     Performed at [facility]   Culture     Final   Value: NO GROWTH 5 DAYS     Performed at [facility]   Report Status 07/06/2013 FINAL   Final  CULTURE, BLOOD (ROUTINE X 2)     Status: None   Collection Time    06/29/13  9:25 PM      Result Value Range Status   Specimen Description BLOOD RIGHT HAND   Final   Special Requests BOTTLES DRAWN AEROBIC ONLY 2ML   Final   Culture  Setup Time     Final   Value: 06/30/2013 01:51     Performed at [facility]   Culture     Final   Value: NO GROWTH 5 DAYS     Performed at [facility]   Report Status 07/06/2013  FINAL   Final  URINE CULTURE     Status: None   Collection Time    06/29/13  9:38 PM      Result Value Range Status   Specimen Description URINE, CLEAN CATCH   Final   Special Requests NONE   Final   Culture  Setup Time     Final   Value: 06/30/2013 03:46     Performed at [facility]   Colony Count     Final   Value: 50,000 COLONIES/ML     Performed at [facility]   Culture     Final   Value: Multiple bacterial morphotypes present, none predominant. Suggest appropriate recollection if clinically indicated.     Performed at [facility]   Report Status 07/01/2013 FINAL   Final  MRSA PCR SCREENING     Status: None   Collection Time    06/30/13  1:04 AM      Result Value Range Status   MRSA by PCR NEGATIVE  NEGATIVE Final   Comment:  The GeneXpert MRSA Assay (FDA     approved for NASAL specimens     only), is one component of a     comprehensive MRSA colonization     surveillance program. It is not     intended to diagnose MRSA     infection nor to guide or     monitor treatment for     MRSA infections.  SURGICAL PCR SCREEN     Status: None   Collection Time    07/02/13  3:54 AM      Result Value Range Status   MRSA, PCR NEGATIVE  NEGATIVE Final   Staphylococcus aureus NEGATIVE  NEGATIVE Final   Comment:            The Xpert SA Assay (FDA     approved for NASAL specimens     in patients over 60 years of age),     is one component of     a comprehensive surveillance     program.  Test performance has     been validated by [employer] for patients greater     than or equal to 52 year old.     It is not intended     to diagnose infection nor to     guide or monitor treatment.  CULTURE, BLOOD (ROUTINE X 2)     Status: None   Collection Time    07/15/13  4:44 PM      Result Value Range Status   Specimen Description BLOOD RIGHT HAND   Final   Special Requests BOTTLES DRAWN AEROBIC AND ANAEROBIC 5 CC   Final   Culture  Setup Time      Final   Value: 07/15/2013 21:56     Performed at [facility]   Culture     Final   Value:        BLOOD CULTURE RECEIVED NO GROWTH TO DATE CULTURE WILL BE HELD FOR 5 DAYS BEFORE ISSUING A FINAL NEGATIVE REPORT     Performed at [facility]   Report Status PENDING   Incomplete  CULTURE, BLOOD (ROUTINE X 2)     Status: None   Collection Time    07/15/13  5:00 PM      Result Value Range Status   Specimen Description BLOOD RIGHT ARM   Final   Special Requests BOTTLES DRAWN AEROBIC AND ANAEROBIC 10 BLUE 5 RED   Final   Culture  Setup Time     Final   Value: 07/15/2013 21:57     Performed at [facility]   Culture     Final   Value:        BLOOD CULTURE RECEIVED NO GROWTH TO DATE CULTURE WILL BE HELD FOR 5 DAYS BEFORE ISSUING A FINAL NEGATIVE REPORT     Performed at [facility]   Report Status PENDING   Incomplete  URINE CULTURE     Status: None   Collection Time    07/15/13  5:32 PM      Result Value Range Status   Specimen Description URINE, CATHETERIZED   Final   Special Requests NONE   Final   Culture  Setup Time     Final   Value: 07/16/2013 03:07     Performed at [facility] Count     Final   Value: >=100,000 COLONIES/ML     Performed at [facility]  Culture     Final   Value: YEAST     Performed at [facility]   Report Status 07/17/2013 FINAL   Final  URINE CULTURE     Status: None   Collection Time    07/15/13 10:55 PM      Result Value Range Status   Specimen Description URINE, CATHETERIZED   Final   Special Requests NONE   Final   Culture  Setup Time     Final   Value: 07/16/2013 03:07     Performed at [facility]   Colony Count     Final   Value: >=100,000 COLONIES/ML     Performed at [facility]   Culture     Final   Value: YEAST     Performed at [facility]   Report Status 07/17/2013 FINAL   Final    Anti-infectives   Start     Dose/Rate Route Frequency  Ordered Stop   07/18/13 1200  vancomycin (VANCOCIN) IVPB 1000 mg/200 mL premix     1,000 mg 200 mL/hr over 60 Minutes Intravenous Every 48 hours 07/18/13 1003     07/16/13 2200  vancomycin (VANCOCIN) 1,500 mg in sodium chloride 0.9 % 500 mL IVPB  Status:  Discontinued     1,500 mg 250 mL/hr over 120 Minutes Intravenous Every 24 hours 07/15/13 2041 07/16/13 0845   07/15/13 2200  imipenem-cilastatin (PRIMAXIN) 250 mg in sodium chloride 0.9 % 100 mL IVPB     250 mg 200 mL/hr over 30 Minutes Intravenous Every 6 hours 07/15/13 2041     07/15/13 1715  vancomycin (VANCOCIN) IVPB 1000 mg/200 mL premix     1,000 mg 200 mL/hr over 60 Minutes Intravenous  Once 07/15/13 1703 07/15/13 1842   07/15/13 1715  piperacillin-tazobactam (ZOSYN) IVPB 3.375 g     3.375 g 100 mL/hr over 30 Minutes Intravenous  Once 07/15/13 1703 07/15/13 1753      Assessment: 68 yo M on Vancomycin therapy PTA for planned 6 week course for L foot osteo/abscess.  Returns to hospital with AMS, confusion, and hypotension.  Pharmacy consulted to continue IV antibiotics.  Of note, patient was on Vancomycin 750mg  IV q18h with the last dose 2/7 0900.  Pt received additional Vancomycin 1gm on presentation to the ED 2/7 1700.    VT 1/28 23.8 (random level) - sent home on 1500 mg IV q48h VR 2/8 1830 = 31 VR 2/9 0750 = 27.9 (ke=0.008, t1/2=86h) VR 2/10 0600 = 21.8 (ke=0.01 t1/2=61h)   Patient's changing renal function is complicating the calculations, with SCr improving from 3.4-->3.06-->2.57.  Noted baseline SCr ~ 2.6.  Goal of Therapy:  Vancomycin trough level 15-20 mcg/ml Eradication of infection  Plan:  Resume Vanc 1g q48h, estimate tr 22.2 (hopefully lower as renal fxn improves) Continue Primaxin 250 mg IV q6h.  [employer], Pharm.D., BCPS Clinical Pharmacist Pager [phone] 07/18/2013 10:13 AM

## 2013-07-18 NOTE — Anesthesia Procedure Notes (Signed)
Procedure Name: LMA Insertion Date/Time: 07/18/2013 2:53 PM Performed by: Angelica PouSMITH, Ejay Lashley PIZZICARA Pre-anesthesia Checklist: Patient identified, Patient being monitored, Emergency Drugs available, Timeout performed and Suction available Patient Re-evaluated:Patient Re-evaluated prior to inductionOxygen Delivery Method: Circle system utilized Preoxygenation: Pre-oxygenation with 100% oxygen Intubation Type: IV induction Ventilation: Mask ventilation without difficulty LMA: LMA inserted LMA Size: 5.0 Number of attempts: 1 Placement Confirmation: breath sounds checked- equal and bilateral and positive ETCO2 Tube secured with: Tape Dental Injury: Teeth and Oropharynx as per pre-operative assessment

## 2013-07-18 NOTE — Progress Notes (Signed)
[facility] PROGRESS NOTE  Justin HansenDonald Study Sr. [id number] DOB: 02/02/1946 DOA: 07/15/2013 PCP: Evlyn CourierHILL,[name], MD  Assessment/Plan: 68 year old with PMH significant for Osteomyelitis of left foot, S/P ID by Dr Magnus IvanBlackman last month, [facility], admitted 2-07 with encephalopathy, confusion, sepsis like picture, worsening renal function. Dr Magnus IvanBlackman consulted for further evaluation of osteomyelitis. Plan is for surgery 2-10 with either a more extensive I&D or more likely a below knee amputation.   Marland Kitchen.  1-Acute Encephalopathy: in setting of fever, infection, hypotension. Continue with support care. CT head negative. At times confuse. Improving.   2-Acute on Chronic renal failure; in setting of hypotension , sepsis. Cr baseline 1.9. Hold diuretic. Change bicarb Gtt to NS. Marland Kitchen. Pharmacy to adjust vancomycin for renal failure. Strict I and O. Cr decrease to 2.5. Urine out put 1.7. Renal US no hydronephrosis.   3-Metabolic acidosis;  likely secondary to renal failure. Resolved with bicarb gtt.   4-Sepsis, Hypotension; Patient presents with fever, hypotension, tachycardia. urine culture only grew yeast. ortho consulted for further evaluation of left foot osteomyelitis. Continue with IV  fluids. Hold diuretic. Last Ef at 55 %. Monitor for pulmonary edema. blood culture no growth to date.   5-Hyperkalemia; resolved with IV fluids, bicarb.   6-UTI; UA with Too numerous to count WBC. Now on imipenem. Urine culture growing yeast.   7-Osteomyelitis of the left foot  - Patient had recent I&D done by Dr. Magnus IvanBlackman.  -plan for surgery today.   8-DIABETES MELLITUS, WITH VASCULAR COMPLICATIONS  -sliding scale insulin. Hold lantus due to poor oral intake.    9-Chronic diastolic CHF (congestive heart failure)  - Currently compensated, hold Lasix and metolazone tonight.  10-Abdominal distension; KUB negative.  11-Anemia; Hb decrease to 7 from 8.9. Received one unit PRBC 2-09. Will  give another unit prior to surgery.   Code Status: Full Code.  Family Communication: none at bedside.  Disposition Plan: remain inpatient.    Consultants:  ortho  Procedures:  none  Antibiotics:  Primaxin 2-7  Vancomycin. 2-7  HPI/Subjective: Awake, alert. Feeling better. No complaints. Wants to speak with his wife.   Objective: Filed Vitals:   07/18/13 0423  BP: 136/62  Pulse: 99  Temp: 98.2 F (36.8 C)  Resp: 18    Intake/Output Summary (Last 24 hours) at 07/18/13 1340 Last data filed at 07/18/13 0553  Gross per 24 hour  Intake    565 ml  Output   1750 ml  Net  -1185 ml   Filed Weights   07/15/13 2024  Weight: 101.424 kg (223 lb 9.6 oz)    Exam:   General:  No distress, awake, slow respond.   Cardiovascular: S 1, S 2 RRR  Respiratory: CTA  Abdomen: Bs present, soft, NT  Musculoskeletal: no edema, left foot with dressing.   Data Reviewed: Basic Metabolic Panel:  Recent Labs Lab 07/15/13 1645 07/16/13 0500 07/16/13 1532 07/17/13 0634 07/18/13 0600  NA 142 144 143 147 147  K 5.3 5.4* 5.1 4.6 4.4  CL 108 112 109 109 110  CO2 20 17* 20 24 24   GLUCOSE 109* 135* 175* 136* 120*  BUN 34* 39* 45* 40* 36*  CREATININE 2.60* 3.14* 3.46* 3.06* 2.57*  CALCIUM 8.3* 8.0* 7.9* 7.8* 7.7*   Liver Function Tests:  Recent Labs Lab 07/15/13 1645  AST 12  ALT <5  ALKPHOS 64  BILITOT 0.2*  PROT 7.7  ALBUMIN 2.4*   No results found for this  basename: LIPASE, AMYLASE,  in the last 168 hours No results found for this basename: AMMONIA,  in the last 168 hours CBC:  Recent Labs Lab 07/15/13 1645 07/16/13 0500 07/17/13 0634 07/18/13 0600  WBC 9.0 8.2 7.2 7.2  NEUTROABS 6.6  --   --   --   HGB 8.4* 8.9* 7.2* 8.2*  HCT 25.9* 28.0* 21.8* 25.7*  MCV 85.8 86.4 84.2 83.7  PLT 266 260 219 228   Cardiac Enzymes:  Recent Labs Lab 07/15/13 1659  TROPONINI <0.30   BNP (last 3 results)  Recent Labs  03/23/13 0450 03/24/13 0415  07/15/13 1659  PROBNP 20208.0* 20835.0* 3108.0*   CBG:  Recent Labs Lab 07/17/13 1224 07/17/13 1649 07/17/13 2135 07/18/13 0740 07/18/13 1152  GLUCAP 158* 127* 192* 113* 108*    Recent Results (from the past 240 hour(s))  CULTURE, BLOOD (ROUTINE X 2)     Status: None   Collection Time    07/15/13  4:44 PM      Result Value Range Status   Specimen Description BLOOD RIGHT HAND   Final   Special Requests BOTTLES DRAWN AEROBIC AND ANAEROBIC 5 CC   Final   Culture  Setup Time     Final   Value: 07/15/2013 21:56     Performed at [facility]   Culture     Final   Value:        BLOOD CULTURE RECEIVED NO GROWTH TO DATE CULTURE WILL BE HELD FOR 5 DAYS BEFORE ISSUING A FINAL NEGATIVE REPORT     Performed at [facility]   Report Status PENDING   Incomplete  CULTURE, BLOOD (ROUTINE X 2)     Status: None   Collection Time    07/15/13  5:00 PM      Result Value Range Status   Specimen Description BLOOD RIGHT ARM   Final   Special Requests BOTTLES DRAWN AEROBIC AND ANAEROBIC 10 BLUE 5 RED   Final   Culture  Setup Time     Final   Value: 07/15/2013 21:57     Performed at [facility]   Culture     Final   Value:        BLOOD CULTURE RECEIVED NO GROWTH TO DATE CULTURE WILL BE HELD FOR 5 DAYS BEFORE ISSUING A FINAL NEGATIVE REPORT     Performed at [facility]   Report Status PENDING   Incomplete  URINE CULTURE     Status: None   Collection Time    07/15/13  5:32 PM      Result Value Range Status   Specimen Description URINE, CATHETERIZED   Final   Special Requests NONE   Final   Culture  Setup Time     Final   Value: 07/16/2013 03:07     Performed at [facility] Count     Final   Value: >=100,000 COLONIES/ML     Performed at [facility]   Culture     Final   Value: YEAST     Performed at [facility]   Report Status 07/17/2013 FINAL   Final  URINE CULTURE     Status: None   Collection Time    07/15/13  10:55 PM      Result Value Range Status   Specimen Description URINE, CATHETERIZED   Final   Special Requests NONE   Final   Culture  Setup Time  Final   Value: 07/16/2013 03:07     Performed at [facility] Count     Final   Value: >=100,000 COLONIES/ML     Performed at [facility]   Culture     Final   Value: YEAST     Performed at [facility]   Report Status 07/17/2013 FINAL   Final     Studies: Dg Abd 1 View  07/16/2013   CLINICAL DATA:  Vomiting, abdominal distention.  EXAM: ABDOMEN - 1 VIEW  COMPARISON:  03/12/2013  FINDINGS: The bowel gas pattern is normal. No radio-opaque calculi or other significant radiographic abnormality are seen. No organomegaly. No acute bony abnormality.  IMPRESSION: No acute findings.   Electronically Signed   By: Charlett Nose M.D.   On: 07/16/2013 17:19   US Renal  07/16/2013   CLINICAL DATA:  Renal failure  EXAM: RENAL/URINARY TRACT ULTRASOUND COMPLETE  COMPARISON:  March 16, 2012  FINDINGS: Right Kidney:  Length: 11.5 cm. Echogenicity within normal limits. No mass or hydronephrosis visualized.  Left Kidney:  Length: 12 cm. Echogenicity within normal limits. No mass or hydronephrosis visualized.  Bladder:  Decompressed with Foley catheter in place.  IMPRESSION: Normal kidneys.  No hydronephrosis bilaterally.   Electronically Signed   By: Sherian Rein M.D.   On: 07/16/2013 23:11    Scheduled Meds: . carvedilol  3.125 mg Oral BID WC  . enoxaparin (LOVENOX) injection  30 mg Subcutaneous Q24H  . feeding supplement (GLUCERNA SHAKE)  237 mL Oral TID BM  . feeding supplement (PRO-STAT SUGAR FREE 64)  30 mL Oral Q breakfast  . imipenem-cilastatin  250 mg Intravenous Q6H  . insulin aspart  0-9 Units Subcutaneous TID WC  . naLOXone (NARCAN)  injection  0.4 mg Intravenous Once  . pantoprazole  40 mg Oral Daily  . sodium chloride  500 mL Intravenous Once  . sodium chloride  3 mL Intravenous Q12H  . tamsulosin  0.4 mg Oral  Daily  . triamcinolone cream  1 application Topical TID  . vancomycin  1,000 mg Intravenous Q48H   Continuous Infusions: . sodium chloride 100 mL/hr at 07/18/13 1326    Principal Problem:   Acute encephalopathy Active Problems:   DIABETES MELLITUS, WITH VASCULAR COMPLICATIONS   Chronic diastolic CHF (congestive heart failure)   Physical deconditioning   Chronic renal failure, stage 4 (severe)   Hypotension   UTI (urinary tract infection)    Time spent: 35 minutes.     Azaryah Heathcock  [facility] Pager [phone]. If 7PM-7AM, please contact night-coverage at [email or web address], password [facility] 07/18/2013, 1:40 PM  LOS: 3 days

## 2013-07-19 DIAGNOSIS — E1159 Type 2 diabetes mellitus with other circulatory complications: Secondary | ICD-10-CM

## 2013-07-19 DIAGNOSIS — R609 Edema, unspecified: Secondary | ICD-10-CM

## 2013-07-19 DIAGNOSIS — I10 Essential (primary) hypertension: Secondary | ICD-10-CM

## 2013-07-19 LAB — GLUCOSE, CAPILLARY
Glucose-Capillary: 119 mg/dL — ABNORMAL HIGH (ref 70–99)
Glucose-Capillary: 107 mg/dL — ABNORMAL HIGH (ref 70–99)
Glucose-Capillary: 143 mg/dL — ABNORMAL HIGH (ref 70–99)
Glucose-Capillary: 136 mg/dL — ABNORMAL HIGH (ref 70–99)

## 2013-07-19 LAB — CBC
HCT: 25.9 % — ABNORMAL LOW (ref 39.0–52.0)
HEMOGLOBIN: 8.3 g/dL — AB (ref 13.0–17.0)
MCH: 26.9 pg (ref 26.0–34.0)
MCHC: 32 g/dL (ref 30.0–36.0)
MCV: 84.1 fL (ref 78.0–100.0)
PLATELETS: 253 10*3/uL (ref 150–400)
RBC: 3.08 MIL/uL — AB (ref 4.22–5.81)
RDW: 15.6 % — ABNORMAL HIGH (ref 11.5–15.5)
WBC: 8.3 10*3/uL (ref 4.0–10.5)

## 2013-07-19 LAB — BASIC METABOLIC PANEL
BUN: 32 mg/dL — ABNORMAL HIGH (ref 6–23)
CO2: 24 meq/L (ref 19–32)
CREATININE: 2.14 mg/dL — AB (ref 0.50–1.35)
Calcium: 7.9 mg/dL — ABNORMAL LOW (ref 8.4–10.5)
Chloride: 114 mEq/L — ABNORMAL HIGH (ref 96–112)
GFR calc Af Amer: 35 mL/min — ABNORMAL LOW (ref >90)
GFR calc non Af Amer: 30 mL/min — ABNORMAL LOW (ref >90)
Glucose, Bld: 144 mg/dL — ABNORMAL HIGH (ref 70–99)
Potassium: 4.3 mEq/L (ref 3.7–5.3)
SODIUM: 149 meq/L — AB (ref 137–147)

## 2013-07-19 MED ORDER — ENOXAPARIN SODIUM 40 MG/0.4ML ~~LOC~~ SOLN
40.0000 mg | SUBCUTANEOUS | Status: DC
  Administered 2013-07-20 (×2): 40 mg via SUBCUTANEOUS
  Filled 2013-07-19 (×3): qty 0.4

## 2013-07-19 NOTE — Progress Notes (Signed)
Subjective: 1 Day Post-Op Procedure(s) (LRB): AMPUTATION BELOW KNEE (Left) Patient reports pain as moderate.  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  Tolerated the surgery well.  Came in with chronic anemia.  Minimal blood loss from surgery and H/H unchanged from the prior day.  Did receive transfusion prior to surgery.  Renal function slightly improved.  Objective: Vital signs in last 24 hours: Temp:  [97.5 F (36.4 C)-99.9 F (37.7 C)] 99.9 F (37.7 C) (02/11 0451) Pulse Rate:  [91-104] 104 (02/11 0451) Resp:  [15-22] 18 (02/10 1700) BP: (141-165)/(58-96) 142/70 mmHg (02/11 0451) SpO2:  [94 %-99 %] 94 % (02/11 0451) Weight:  [99.565 kg (219 lb 8 oz)] 99.565 kg (219 lb 8 oz) (02/11 0451)  Intake/Output from previous day: 02/10 0701 - 02/11 0700 In: 900 [P.O.:650; I.V.:250] Out: 330 [Urine:230; Blood:100] Intake/Output this shift:     Recent Labs  07/17/13 0634 07/18/13 0600 07/18/13 2200 07/19/13 0416  HGB 7.2* 8.2* 8.4* 8.3*    Recent Labs  07/18/13 0600 07/18/13 2200 07/19/13 0416  WBC 7.2  --  8.3  RBC 3.07*  --  3.08*  HCT 25.7* 25.9* 25.9*  PLT 228  --  253    Recent Labs  07/18/13 0600 07/19/13 0416  NA 147 149*  K 4.4 4.3  CL 110 114*  CO2 24 24  BUN 36* 32*  CREATININE 2.57* 2.14*  GLUCOSE 120* 144*  CALCIUM 7.7* 7.9*   No results found for this basename: LABPT, INR,  in the last 72 hours  Incision: dressing C/D/I  Assessment/Plan: 1 Day Post-Op Procedure(s) (LRB): AMPUTATION BELOW KNEE (Left) From an Ortho standpoint, I plan to leave the current dressing on his leg for the next week.  Once he is medically stable, he can be returned to his skilled nursing facility.  Alasia Enge Y 07/19/2013, 7:10 AM

## 2013-07-19 NOTE — Progress Notes (Signed)
Patient ID: Justin Maggart Sr.  male  ZOX:[id number]    DOB: 10-Jul-1945    DOA: 07/15/2013  PCP: [name], MD  Assessment/Plan:  68 year old with PMH significant for Osteomyelitis of left foot, S/P ID by Dr Magnus Ivan last month, was receiving IV antibiotics at [facility], admitted 2-07 with encephalopathy, confusion, sepsis like picture, worsening renal function. Dr Magnus Ivan consulted for further evaluation of osteomyelitis.   1-Acute Encephalopathy: in setting of fever, infection, hypotension.  - Continue with support care. CT head negative.   2-Acute on Chronic renal failure; in setting of hypotension , sepsis. Cr baseline 1.9. - Improving, continue to hold diuretics, continue gentle hydration  - Renal US showed no hydronephrosis.   3-Metabolic acidosis; likely secondary to renal failure. Resolved with bicarb gtt.   4-Sepsis, Hypotension; Patient presents with fever, hypotension, tachycardia. urine culture only grew yeast. ortho consulted for further evaluation of left foot osteomyelitis. Continue with IV fluids. Hold diuretic. Last Ef at 55 %. Monitor for pulmonary edema. blood culture no growth to date.   5-Hyperkalemia; resolved with IV fluids, bicarb.   6-UTI; UA with Too numerous to count WBC. Now on imipenem. Urine culture growing yeast.   7-Osteomyelitis of the left foot  - Patient had recent I&D done by Dr. Magnus Ivan.  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 Hold lantus due to poor oral intake.   9-Chronic diastolic CHF (congestive heart failure)  - Currently compensated, hold Lasix and metolazone  10-Abdominal distension; KUB negative.   11-Anemia;  - Received one unit PRBC 2/09.   DVT Prophylaxis:  Code Status:  Family Communication:  Disposition: SNF when medically ready    Subjective: Somewhat lethargic today, left BKA done yesterday  Objective: Weight change:    Intake/Output Summary (Last 24 hours) at 07/19/13 1624 Last data filed at 07/19/13 0455  Gross per 24 hour  Intake    650 ml  Output    200 ml  Net    450 ml   Blood pressure 125/70, pulse 95, temperature 98.8 F (37.1 C), temperature source Oral, resp. rate 20, height 5\' 11"  (1.803 m), weight 99.565 kg (219 lb 8 oz), SpO2 98.00%.  Physical Exam: General: Somewhat lethargic but arousable CVS: S1-S2 clear, no murmur rubs or gallops Chest: clear to auscultation bilaterally Abdomen: soft nontender, mildly distended, normal bowel sounds  Extremities: Left BKA, dressing intact, right lower extremity in a heel protector   Lab Results: Basic Metabolic Panel:  Recent Labs Lab 07/18/13 0600 07/19/13 0416  NA 147 149*  K 4.4 4.3  CL 110 114*  CO2 24 24  GLUCOSE 120* 144*  BUN 36* 32*  CREATININE 2.57* 2.14*  CALCIUM 7.7* 7.9*   Liver Function Tests:  Recent Labs Lab 07/15/13 1645  AST 12  ALT <5  ALKPHOS 64  BILITOT 0.2*  PROT 7.7  ALBUMIN 2.4*   No results found for this basename: LIPASE, AMYLASE,  in the last 168 hours No results found for this basename: AMMONIA,  in the last 168 hours CBC:  Recent Labs Lab 07/15/13 1645  07/18/13 0600 07/18/13 2200 07/19/13 0416  WBC 9.0  < > 7.2  --  8.3  NEUTROABS 6.6  --   --   --   --   HGB 8.4*  < > 8.2* 8.4* 8.3*  HCT 25.9*  < > 25.7* 25.9* 25.9*  MCV 85.8  < > 83.7  --  84.1  PLT 266  < > 228  --  253  < > = values in this interval not displayed. Cardiac Enzymes:  Recent Labs Lab 07/15/13 1659  TROPONINI <0.30   BNP: No components found with this basename: POCBNP,  CBG:  Recent Labs Lab 07/18/13 1437 07/18/13 1638 07/18/13 2119 07/19/13 0743 07/19/13 1151  GLUCAP 112* 106* 147* 119* 107*     Micro Results: Recent Results (from the past 240 hour(s))  CULTURE, BLOOD (ROUTINE X 2)     Status: None   Collection Time    07/15/13  4:44 PM      Result Value Ref Range Status   Specimen Description  BLOOD RIGHT HAND   Final   Special Requests BOTTLES DRAWN AEROBIC AND ANAEROBIC 5 CC   Final   Culture  Setup Time     Final   Value: 07/15/2013 21:56     Performed at [facility]   Culture     Final   Value:        BLOOD CULTURE RECEIVED NO GROWTH TO DATE CULTURE WILL BE HELD FOR 5 DAYS BEFORE ISSUING A FINAL NEGATIVE REPORT     Performed at [facility]   Report Status PENDING   Incomplete  CULTURE, BLOOD (ROUTINE X 2)     Status: None   Collection Time    07/15/13  5:00 PM      Result Value Ref Range Status   Specimen Description BLOOD RIGHT ARM   Final   Special Requests BOTTLES DRAWN AEROBIC AND ANAEROBIC 10 BLUE 5 RED   Final   Culture  Setup Time     Final   Value: 07/15/2013 21:57     Performed at [facility]   Culture     Final   Value:        BLOOD CULTURE RECEIVED NO GROWTH TO DATE CULTURE WILL BE HELD FOR 5 DAYS BEFORE ISSUING A FINAL NEGATIVE REPORT     Performed at [facility]   Report Status PENDING   Incomplete  URINE CULTURE     Status: None   Collection Time    07/15/13  5:32 PM      Result Value Ref Range Status   Specimen Description URINE, CATHETERIZED   Final   Special Requests NONE   Final   Culture  Setup Time     Final   Value: 07/16/2013 03:07     Performed at [facility] Count     Final   Value: >=100,000 COLONIES/ML     Performed at [facility]   Culture     Final   Value: YEAST     Performed at [facility]   Report Status 07/17/2013 FINAL   Final  URINE CULTURE     Status: None   Collection Time    07/15/13 10:55 PM      Result Value Ref Range Status   Specimen Description URINE, CATHETERIZED   Final   Special Requests NONE   Final   Culture  Setup Time     Final   Value: 07/16/2013 03:07     Performed at [facility] Count     Final   Value: >=100,000 COLONIES/ML     Performed at [facility]   Culture     Final   Value: YEAST     Performed  at [facility]   Report  Status 07/17/2013 FINAL   Final    Studies/Results: Dg Abd 1 View  07/16/2013   CLINICAL DATA:  Vomiting, abdominal distention.  EXAM: ABDOMEN - 1 VIEW  COMPARISON:  03/12/2013  FINDINGS: The bowel gas pattern is normal. No radio-opaque calculi or other significant radiographic abnormality are seen. No organomegaly. No acute bony abnormality.  IMPRESSION: No acute findings.   Electronically Signed   By: Charlett Nose M.D.   On: 07/16/2013 17:19   Ct Head Wo Contrast  07/15/2013   CLINICAL DATA:  68 year old male with altered mental status and fever.  EXAM: CT HEAD WITHOUT CONTRAST  TECHNIQUE: Contiguous axial images were obtained from the base of the skull through the vertex without intravenous contrast.  COMPARISON:  03/17/2013 and prior head CTs dating back to 06/26/2011  FINDINGS: Mild atrophy again noted.  No acute intracranial abnormalities are identified, including mass lesion or mass effect, hydrocephalus, extra-axial fluid collection, midline shift, hemorrhage, or acute infarction. The visualized bony calvarium is unremarkable.  IMPRESSION: No evidence of acute intracranial abnormality.  Mild atrophy.   Electronically Signed   By: Laveda Abbe M.D.   On: 07/15/2013 17:33   US Renal  07/16/2013   CLINICAL DATA:  Renal failure  EXAM: RENAL/URINARY TRACT ULTRASOUND COMPLETE  COMPARISON:  March 16, 2012  FINDINGS: Right Kidney:  Length: 11.5 cm. Echogenicity within normal limits. No mass or hydronephrosis visualized.  Left Kidney:  Length: 12 cm. Echogenicity within normal limits. No mass or hydronephrosis visualized.  Bladder:  Decompressed with Foley catheter in place.  IMPRESSION: Normal kidneys.  No hydronephrosis bilaterally.   Electronically Signed   By: Sherian Rein M.D.   On: 07/16/2013 23:11   Mr Foot Left W Wo Contrast  06/30/2013   CLINICAL DATA:  Diabetic patient with a nonhealing ulcer of the heel.  EXAM: MRI OF THE LEFT FOREFOOT WITHOUT AND WITH CONTRAST   TECHNIQUE: Multiplanar, multisequence MR imaging was performed both before and after administration of intravenous contrast.  CONTRAST:  10 mL MULTIHANCE GADOBENATE DIMEGLUMINE 529 MG/ML IV SOLN  COMPARISON:  None.  FINDINGS: A large skin ulceration is seen over the heel. There is edema and enhancement in the underlying calcaneus consistent osteomyelitis. Edema and enhancement is most prominent in the posterior 1.5 cm of the calcaneus from top-to-bottom. Signal changed extends 4 cm into the calcaneus at the level of the posterior facet of the subtalar joint. There is extensive subcutaneous edema about the ankle compatible with cellulitis. No abscess is identified. Patchy areas of signal abnormality and enhancement are seen in the distal 4 cm of the Achilles tendon and the tendon is thickened. There is no fluid about the Achilles. Visualized tendons about the ankle are otherwise unremarkable. No ligament tear is identified.  IMPRESSION: Cellulitis about the lower leg and with a large ulceration over the heel. Signal abnormality in the calcaneus as described above is consistent with osteomyelitis. No abscess is identified.  Signal abnormality and thickening in the distal Achilles tendon is consistent with tendinosis. Edema and enhancement in the distal 4 cm of the tendon could be due to infection although there is no fluid about the tendon as is typically seen in infectious tenosynovitis.   Electronically Signed   By: Drusilla Kanner M.D.   On: 06/30/2013 07:32   Mr Knee Left  Wo Contrast  06/30/2013   CLINICAL DATA:  Left knee pain and limited range of motion.  EXAM: MRI OF THE LEFT KNEE WITHOUT CONTRAST  TECHNIQUE: Multiplanar,  multisequence MR imaging of the knee was performed. No intravenous contrast was administered.  COMPARISON:  None.  FINDINGS: Please note that IV contrast was not administered as the patient could not tolerate further scanning. Patient motion somewhat degrades this exam.  MENISCI  Medial  meniscus: There is an oblique tear in the posterior horn of the medial meniscus reaching the meniscal undersurface. No displaced fragment.  Lateral meniscus:  Intact.  LIGAMENTS  Cruciates:  Intact.  Collaterals:  Intact.  CARTILAGE  Patellofemoral:  Unremarkable.  Medial:  Unremarkable.  Lateral:  Unremarkable.  Joint:  Small joint effusion is present.  Popliteal Fossa:  No Baker cyst.  Extensor Mechanism:  Intact.  Bones: Minimal marrow edema is seen the posterior aspect of medial femoral condyle and medial tibial plateau.  IMPRESSION: Minimal marrow edema in the posterior medial femoral condyle and medial tibial plateau is likely related to degenerative disease. The appearance of the knee is not suggestive of septic joint or osteomyelitis.  Oblique tear posterior horn medial meniscus.   Electronically Signed   By: Drusilla Kanner M.D.   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 He is in need of durable central venous access for outpatient intravenous antibiotic therapy. He has a history of poor renal function an so placement of a PICC in upper extremity vein is a deferred. We will proceed with placement of a tunneled IJ PICC for outpatient use.  EXAM: IR ULTRASOUND GUIDANCE VASC ACCESS RIGHT; IR RIGHT FLOURO GUIDE CV LINE  TECHNIQUE: The right neck and chest were prepped with chlorhexidine, draped in the usual sterile fashion using maximum barrier technique (cap and mask, sterile gown, sterile gloves, large sterile sheet, hand hygiene and cutaneous antiseptic). Local anesthesia was attained by infiltration with 1% lidocaine.  Ultrasound demonstrated patency of the right internal jugular vein, and this was documented with an image. Under real-time ultrasound guidance, this vein was accessed with a 21 gauge micropuncture needle and image documentation was performed. The needle was exchanged over a guidewire for a peel-away  sheath through which a 21 cm 5 [place] dual lumen power injectable PICC was advanced, and positioned with its tip at the SVC/right atrial junction. Fluoroscopy during the procedure and fluoro spot radiograph confirms appropriate catheter position. The catheter was flushed, secured to the skin with Prolene sutures, and covered with a sterile dressing.  FLUOROSCOPY TIME:  18 seconds  COMPLICATIONS: None.  The patient tolerated the procedure well.  IMPRESSION: Successful placement of a right IJ tunneled dual-lumen power PICC with sonographic and fluoroscopic guidance. The catheter is ready for use.  Signed,  Sterling Big, MD  [facility]  [facility]   Electronically Signed   By: Malachy Moan M.D.   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 He is in need of durable central venous access for outpatient intravenous antibiotic therapy. He has a history of poor renal function an so placement of a PICC in upper extremity vein is a deferred. We will proceed with placement of a tunneled IJ PICC for outpatient use.  EXAM: IR ULTRASOUND GUIDANCE VASC ACCESS RIGHT; IR RIGHT FLOURO GUIDE CV LINE  TECHNIQUE: The right neck and chest were prepped with chlorhexidine, draped in the usual sterile fashion using maximum barrier technique (cap and mask, sterile gown, sterile gloves,  large sterile sheet, hand hygiene and cutaneous antiseptic). Local anesthesia was attained by infiltration with 1% lidocaine.  Ultrasound demonstrated patency of the right internal jugular vein, and this was documented with an image. Under real-time ultrasound guidance, this vein was accessed with a 21 gauge micropuncture needle and image documentation was performed. The needle was exchanged over a guidewire for a peel-away sheath through which a 21 cm 5 [place] dual lumen power injectable PICC was advanced, and  positioned with its tip at the SVC/right atrial junction. Fluoroscopy during the procedure and fluoro spot radiograph confirms appropriate catheter position. The catheter was flushed, secured to the skin with Prolene sutures, and covered with a sterile dressing.  FLUOROSCOPY TIME:  18 seconds  COMPLICATIONS: None.  The patient tolerated the procedure well.  IMPRESSION: Successful placement of a right IJ tunneled dual-lumen power PICC with sonographic and fluoroscopic guidance. The catheter is ready for use.  Signed,  Sterling BigHeath K. McCullough, MD  [facility]  [facility]   Electronically Signed   By: Malachy MoanHeath  McCullough M.D.   On: 07/05/2013 10:17   Dg Chest Port 1 View  (if Code Sepsis Called)  07/15/2013   CLINICAL DATA:  Altered mental status.  Fever.  Sepsis.  EXAM: PORTABLE CHEST - 1 VIEW  COMPARISON:  03/22/2013.  FINDINGS: Interval right jugular catheter with its tip at the junction of the superior vena cava and right atrium. Poor inspiration with no gross change in borderline enlargement of the cardiac silhouette. Interval minimal right pleural effusion. Mild prominence of the interstitial markings without significant change when the decreased inspiration is taken into account. Unremarkable bones.  IMPRESSION: 1. Interval minimal right pleural effusion. 2. Stable borderline cardiomegaly. 3. Stable mild chronic interstitial lung disease.   Electronically Signed   By: Gordan PaymentSteve  Reid M.D.   On: 07/15/2013 17:09   Dg Foot Complete Left  06/29/2013   CLINICAL DATA:  Diabetes.  Left foot pain.  Wound check.  EXAM: LEFT FOOT - COMPLETE 3+ VIEW  COMPARISON:  None.  FINDINGS: Soft tissue wound noted over the calcaneus. No underlying bony abnormality. No foreign body. Diffuse degenerative change present. No evidence of fracture.  IMPRESSION: Soft tissue wound noted over calcaneus. No evidence of bony abnormality. No lytic bony abnormality. No foreign body.   Electronically  Signed   By: Maisie Fushomas  Register   On: 06/29/2013 16:26    Medications: Scheduled Meds: . carvedilol  3.125 mg Oral BID WC  . enoxaparin (LOVENOX) injection  40 mg Subcutaneous Q24H  . feeding supplement (GLUCERNA SHAKE)  237 mL Oral TID BM  . feeding supplement (PRO-STAT SUGAR FREE 64)  30 mL Oral Q breakfast  . imipenem-cilastatin  250 mg Intravenous Q6H  . insulin aspart  0-9 Units Subcutaneous TID WC  . naLOXone (NARCAN)  injection  0.4 mg Intravenous Once  . pantoprazole  40 mg Oral Daily  . sodium chloride  500 mL Intravenous Once  . sodium chloride  3 mL Intravenous Q12H  . tamsulosin  0.4 mg Oral Daily  . triamcinolone cream  1 application Topical TID  . vancomycin  1,000 mg Intravenous Q48H      LOS: 4 days   RAI,RIPUDEEP M.D. [facility] 07/19/2013, 4:24 PM Pager: [phone]  If 7PM-7AM, please contact night-coverage [email or web address] Password [email or web address]

## 2013-07-19 NOTE — Clinical Social Work Psychosocial (Addendum)
Clinical Social Work Department  BRIEF PSYCHOSOCIAL ASSESSMENT  Patient: Crisanto Nied Sr. Account Number: [id number]   Admit date: 07/15/13 Clinical Social Worker Rhea Pink, MSW Date/Time: 07/19/13 Referred by: Physician Date Referred: 07/18/13 Referred for   SNF Placement   Other Referral:  Interview type: Patient  Other interview type: PSYCHOSOCIAL DATA  Living Status:  Admitted from facility: [facility] Level of care: SNF Primary support name: Sladek,[name]  Primary support relationship to patient: Spouse Degree of support available:  Strong and vested  CURRENT CONCERNS  Current Concerns   Post-Acute Placement   Other Concerns:  SOCIAL WORK ASSESSMENT / PLAN  CSW met with pt re: PT recommendation for SNF.   Pt lives at [place] re-explained placement process and answered questions.   Pt. Is agreeable to returning to SNF and reported no anxiety about returning    CSW completed FL2 and initiated SNF search.     Assessment/plan status: Information/Referral to [employer]  Other assessment/ plan:  Information/referral to community resources:  SNF     PATIENT'S/FAMILY'S RESPONSE TO PLAN OF CARE:  Pt  reports he is agreeable to [facility] in order to increase strength and independence with mobility prior to returning home  Pt verbalized understanding of placement process and appreciation for CSW assist.    Rhea Pink, MSW, [place]

## 2013-07-19 NOTE — Op Note (Signed)
NAMMaricela Curet:  Nola, Rudra             ACCOUNT NO.:  [id number]  MEDICAL RECORD NO.:  [id number]  LOCATION:  [id number]                        FACILITY:  [facility]  PHYSICIAN:  Vanita PandaChristopher Y. Magnus IvanBlackman, M.D.DATE OF BIRTH:  1946/01/16  DATE OF PROCEDURE:  07/18/2013 DATE OF DISCHARGE:                              OPERATIVE REPORT   PREOPERATIVE DIAGNOSES:  Severe necrosis with infection left foot and ankle wound status post partial excision of his calcaneus and irrigation and debridement x1  POSTOPERATIVE DIAGNOSES:  Severe necrosis with infection left foot and ankle wound status post partial excision of his calcaneus and irrigation and debridement x1.  PROCEDURE:  Left below-knee amputation.  SURGEON:  Vanita PandaChristopher Y. Magnus IvanBlackman, M.D.  ASSISTANT:  Richardean CanalGilbert Clark, PA-C.  ANESTHESIA:  General.  ESTIMATED BLOOD LOSS:  Less than 100 mL.  COMPLICATIONS:  None.  TOURNIQUET TIME:  Less than 20 minutes.  INDICATIONS:  [name] is a 68 year old bed-bound individual who in the last month we took to the operating room to severe necrosis of his left calcaneus with osteomyelitis as well.  I performed a partial calcanectomy and was able to close the wound; however, due to consistent pressure on his ankle, he has necrosis and overwhelming infection is still involving this.  It is getting him sick and at this point, other than doing nothing, we have recommended a below-knee amputation.  He does have good skin above the ankle with good hair and viable tissue and I believe he can heal below-knee amputation.  His wife and family has seen the wound and he understands why we are doing this.  He does agree to proceed with surgery.  PROCEDURE DESCRIPTION:  After informed consent was obtained, appropriate left leg was marked.  He was brought to the operating room, placed supine on the operating table.  General anesthesia was then obtained.  A nonsterile tourniquet placed around his upper left thigh.  His  left leg was prepped and draped from the thigh down to the foot with DuraPrep and sterile drapes with Betadine scrub and paint.  Time-out was called.  He was identified as correct patient, correct left leg.  We then used the Esmarch around leg and tourniquet was inflated to 300 mm of pressure.  I then made incision transverse distal to the tibial tubercle and carried this in fishmouth fashion anteriorly on both the medial and lateral side and then a long posterior flap.  I then used an oscillating saw to make my tibial and fibular cuts with the fibular cut being just slightly more proximal to the tibia cut with the hopes of being able to get him into a prosthesis at some point.  I then used an amputation blade, and made soft tissue cut from behind the bone and soft tissue to the most distal aspect of the flap.  We then passed the leg off the table.  I was able to recontour for the flap infection to bring the posterior flap up to the anterior.  We then identified each neurovascular bundle, and we cut the nerves to the retract up higher and used 2-0 silk ties to tie off the vein and artery.  We then let the tourniquet down at 19  minutes, and there was some good bleeding of just the muscle tissue.  We were able to irrigate the tissue with normal saline solution.  We then reapproximated the flap with first deep layer of interrupted 0 Vicryl suture, followed by 2-0 Vicryl with subcutaneous tissue, and interrupted 2-0 nylon on skin.  We then placed Xeroform and a well-padded dressing from the thigh down to the keep the knee extended.  He was awakened, extubated, and taken to recovery room in stable condition.  All final counts correct. There were no complications noted.  Of note, Richardean Canal, PA-C was present in the entire case and his presence was crucial for this case for helping to retract and remove the leg and closure of the wound.     Vanita Panda. Magnus Ivan, M.D.     CYB/MEDQ   D:  07/18/2013  T:  07/19/2013  Job:  [id number]

## 2013-07-19 NOTE — Progress Notes (Signed)
Foley d/c, pt tolerated well 

## 2013-07-20 ENCOUNTER — Encounter (HOSPITAL_COMMUNITY): Payer: Self-pay | Admitting: Orthopaedic Surgery

## 2013-07-20 LAB — GLUCOSE, CAPILLARY
GLUCOSE-CAPILLARY: 169 mg/dL — AB (ref 70–99)
Glucose-Capillary: 157 mg/dL — ABNORMAL HIGH (ref 70–99)
Glucose-Capillary: 156 mg/dL — ABNORMAL HIGH (ref 70–99)
Glucose-Capillary: 133 mg/dL — ABNORMAL HIGH (ref 70–99)

## 2013-07-20 LAB — BASIC METABOLIC PANEL
BUN: 28 mg/dL — AB (ref 6–23)
CALCIUM: 7.5 mg/dL — AB (ref 8.4–10.5)
CO2: 24 meq/L (ref 19–32)
CREATININE: 1.94 mg/dL — AB (ref 0.50–1.35)
Chloride: 115 mEq/L — ABNORMAL HIGH (ref 96–112)
GFR calc Af Amer: 39 mL/min — ABNORMAL LOW (ref >90)
GFR, EST NON AFRICAN AMERICAN: 34 mL/min — AB (ref >90)
GLUCOSE: 170 mg/dL — AB (ref 70–99)
Potassium: 4.4 mEq/L (ref 3.7–5.3)
Sodium: 149 mEq/L — ABNORMAL HIGH (ref 137–147)

## 2013-07-20 MED ORDER — SODIUM CHLORIDE 0.9 % IV SOLN
500.0000 mg | Freq: Three times a day (TID) | INTRAVENOUS | Status: DC
  Administered 2013-07-20 – 2013-07-21 (×2): 500 mg via INTRAVENOUS
  Filled 2013-07-20 (×4): qty 500

## 2013-07-20 MED ORDER — VANCOMYCIN HCL IN DEXTROSE 750-5 MG/150ML-% IV SOLN
750.0000 mg | Freq: Two times a day (BID) | INTRAVENOUS | Status: DC
  Administered 2013-07-20: 750 mg via INTRAVENOUS
  Filled 2013-07-20 (×2): qty 150

## 2013-07-20 MED ORDER — DEXTROSE 5 % IV SOLN
INTRAVENOUS | Status: DC
  Administered 2013-07-20 – 2013-07-21 (×2): via INTRAVENOUS

## 2013-07-20 NOTE — Progress Notes (Signed)
Physical Therapy Treatment Patient Details Name: Justin HansenDonald Kowalchuk Sr. MRN: [id number] DOB: 10/30/1945 Today's Date: 07/20/2013 Time: [id number] PT Time Calculation (min): 21 min  PT Assessment / Plan / Recommendation  History of Present Illness 68 y.o. male admitted [facility] 07[facility]13 from [facility] with multiple medical problems including diabetes, hypertension, chronic diastolic CHF with left foot osteomyelitis, abscess s/p I&D by Dr. Magnus IvanBlackman on 07/02/2013.  He comes in this visit with AMS, fever, lethargy.  Dx with acute encepholopathy, metabolic acidosis, sepsis, hypotension, hyperkalemia, osteomyelitis of his left foot, UTI, acute on chronic renal failure, anemia (getting blood transfusion 07/17/13).  He is likely going to require BKA of his left foot this admission.  Ortho following.  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     PT Comments   Pt reluctantly agreeable to exercises in bed only.  He did not want to attempt EOB due to anticipated pain with movement.  Therapist educated pt on amputee exercises and encouraged mobility next session and assured pt that we would try to pre medicate him for his therapy sessions.    Follow Up Recommendations  SNF      Does the patient have the potential to tolerate intense rehabilitation    NA  Barriers to Discharge   None      Equipment Recommendations  None recommended by PT    Recommendations for Other Services   None  Frequency Min 2X/week   Progress towards PT Goals Progress towards PT goals: Not progressing toward goals - comment (pt refusing mobility due to anticipated pain)  Plan Current plan remains appropriate    Precautions / Restrictions Precautions Precautions: Fall Required Braces or Orthoses: Other Brace/Splint Other Brace/Splint: blue heel protector Restrictions LLE Weight Bearing: Non weight bearing   Pertinent Vitals/Pain See vitals flow sheet.     Mobility    pt refused EOB activity today   Exercises General Exercises -  Upper Extremity Shoulder Flexion: AROM;AAROM;Right;Left;10 reps;Supine Elbow Flexion: AAROM;Both;10 reps;Supine (against therapist's resistance) General Exercises - Lower Extremity Ankle Circles/Pumps: AROM;Right;10 reps;Supine Heel Slides: AAROM;Right;10 reps;Supine Hip ABduction/ADduction: AAROM;Right;10 reps;Supine Straight Leg Raises: AAROM;Right;10 reps;Supine Amputee Exercises Quad Sets: AROM;Left;10 reps;Supine Towel Squeeze: AROM;Both;10 reps;Supine Hip ABduction/ADduction: AROM;Both;10 reps;Supine Straight Leg Raises: AAROM;Left;10 reps;Supine     PT Goals (current goals can now be found in the care plan section) Acute Rehab PT Goals Patient Stated Goal: to decrease pain  Visit Information  Last PT Received On: 07/20/13 Assistance Needed: +2 History of Present Illness: 68 y.o. male [facility] with multiple medical problems including diabetes, hypertension, chronic diastolic CHF with left foot osteomyelitis, abscess s/p I&D by Dr. Magnus IvanBlackman on 07/02/2013.  He comes in this visit with AMS, fever, lethargy.  Dx with acute encepholopathy, metabolic acidosis, sepsis, hypotension, hyperkalemia, osteomyelitis of his left foot, UTI, acute on chronic renal failure, anemia (getting blood transfusion 07/17/13).  He is likely going to require BKA of his left foot this admission.  Ortho following.  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      Subjective Data  Subjective: Pt reports that it hurts to move and he is going to wait until it doesn't hurt to move to sit up on the side of the bed.  I educated him on why this would be detramental to his recovery and healing.   Patient Stated Goal: to decrease pain   Cognition  Cognition Arousal/Alertness: Awake/alert Behavior During Therapy: Flat affect Overall Cognitive Status: No  family/caregiver present to determine baseline cognitive functioning    Balance  General Comments General comments (skin integrity,  edema, etc.): Pt reports that his dressing around his residual left limb seems very tight at the top (proximally on his thigh.  PT assess and unraveled dressing that had rolled down and in the process noticed that the wrapping had become soiled by a previous BM.  RN made aware.    End of Session PT - End of Session Activity Tolerance: Patient limited by pain Patient left: in bed;with call bell/phone within reach;Other (comment) (pt declined being turned on his side for pressure relief)     Flannery Cavallero B. Sharhonda Atwood, PT, DPT [phone]   07/20/2013, 11:35 AM

## 2013-07-20 NOTE — Progress Notes (Signed)
OT Cancellation Note  Patient Details Name: Mitzi HansenDonald Guizar Sr. MRN: [id number] DOB: 02/20/1946   Cancelled Treatment:    Reason Eval/Treat Not Completed: Patient declined, no reason specified (will attempt again this pm)  [facility] CenterWARD,HILLARY Jahmia Berrett, OTR/L  [phone] 07/20/2013 07/20/2013, 1:25 PM

## 2013-07-20 NOTE — Progress Notes (Signed)
Patient ID: Justin Pichardo Sr.  male  ZOX:[id number]    DOB: [birth year]    DOA: 07/15/2013  PCP: [name], MD  Assessment/Plan:  68 year old with PMH significant for Osteomyelitis of left foot, S/P ID by Dr Magnus Ivan last month, was receiving IV antibiotics at [facility], admitted 2-07 with encephalopathy, confusion, sepsis like picture, worsening renal function. Dr Magnus Ivan consulted for further evaluation of osteomyelitis.   1-Acute Encephalopathy: in setting of fever, infection, hypotension, improving.  - Continue with support care. CT head negative.   2-Acute on Chronic renal failure; in setting of hypotension , sepsis. Cr baseline 1.9. - Currently at baseline, Improving, continue to hold diuretics, continue gentle hydration  - Renal US showed no hydronephrosis.   3-hypernatremia Na 149 and trending up - Will DC in normal saline IV fluids, placed on D5 water - Metabolic acidosis, likely secondary to renal failure. Resolved with bicarb gtt.   4-Sepsis, Hypotension; Patient presented with fever, hypotension, tachycardia. urine culture only grew yeast. ortho consulted for further evaluation of left foot osteomyelitis. Hold diuretic. Last Ef at 55 %. Monitor for pulmonary edema. blood culture no growth to date. - Source likely left foot osteomyelitis, left BKA done on 07/18/2013   5-Hyperkalemia; resolved with IV fluids, bicarb.   6-UTI; UA with Too numerous to count WBC. Now on imipenem. Urine culture growing yeast.   7-Osteomyelitis of the left foot  - Patient had recent I&D done by Dr. Magnus Ivan.  - Left BKA done on 07/18/2013, continue vancomycin and imipenem -Orthopedics recommending to continue antibiotics, high long/duration?   8-DIABETES MELLITUS, WITH VASCULAR COMPLICATIONS  -sliding scale insulin. Hold lantus due to poor oral intake.   9-Chronic diastolic CHF (congestive heart failure)  - Currently compensated, hold Lasix and metolazone  10-Abdominal distension; KUB negative.    11-Anemia;  - Received one unit PRBC 2/09.   DVT Prophylaxis:  Code Status:  Family Communication:  Disposition: SNF when medically ready, Hopefully tomorrow    Subjective: Does not feel too well today, although appropriately answering, reporting pain in the left extremity  Objective: Weight change:   Intake/Output Summary (Last 24 hours) at 07/20/13 1440 Last data filed at 07/20/13 0600  Gross per 24 hour  Intake      0 ml  Output    450 ml  Net   -450 ml   Blood pressure 164/80, pulse 84, temperature 98.2 F (36.8 C), temperature source Oral, resp. rate 18, height 5\' 11"  (1.803 m), weight 99.565 kg (219 lb 8 oz), SpO2 97.00%.  Physical Exam: General: Slightly lethargic although mental status improving from yesterday  CVS: S1-S2 clear, no murmur rubs or gallops Chest: clear to auscultation bilaterally Abdomen: soft nontender, mildly distended, normal bowel sounds  Extremities: Left BKA, dressing intact, right lower extremity in a heel protector   Lab Results: Basic Metabolic Panel:  Recent Labs Lab 07/19/13 0416 07/20/13 0603  NA 149* 149*  K 4.3 4.4  CL 114* 115*  CO2 24 24  GLUCOSE 144* 170*  BUN 32* 28*  CREATININE 2.14* 1.94*  CALCIUM 7.9* 7.5*   Liver Function Tests:  Recent Labs Lab 07/15/13 1645  AST 12  ALT <5  ALKPHOS 64  BILITOT 0.2*  PROT 7.7  ALBUMIN 2.4*   No results found for this basename: LIPASE, AMYLASE,  in the last 168 hours No results found for this basename: AMMONIA,  in the last 168 hours CBC:  Recent Labs Lab 07/15/13 1645  07/18/13 0600 07/18/13 2200 07/19/13  0416  WBC 9.0  < > 7.2  --  8.3  NEUTROABS 6.6  --   --   --   --   HGB 8.4*  < > 8.2* 8.4* 8.3*  HCT 25.9*  < > 25.7* 25.9* 25.9*  MCV 85.8  < > 83.7  --  84.1  PLT 266  < > 228  --  253  < > = values in this interval not displayed. Cardiac Enzymes:  Recent Labs Lab 07/15/13 1659  TROPONINI <0.30   BNP: No components found with this basename:  POCBNP,  CBG:  Recent Labs Lab 07/19/13 1151 07/19/13 1644 07/19/13 2133 07/20/13 0727 07/20/13 1152  GLUCAP 107* 143* 136* 157* 156*     Micro Results: Recent Results (from the past 240 hour(s))  CULTURE, BLOOD (ROUTINE X 2)     Status: None   Collection Time    07/15/13  4:44 PM      Result Value Ref Range Status   Specimen Description BLOOD RIGHT HAND   Final   Special Requests BOTTLES DRAWN AEROBIC AND ANAEROBIC 5 CC   Final   Culture  Setup Time     Final   Value: 07/15/2013 21:56     Performed at [facility]   Culture     Final   Value:        BLOOD CULTURE RECEIVED NO GROWTH TO DATE CULTURE WILL BE HELD FOR 5 DAYS BEFORE ISSUING A FINAL NEGATIVE REPORT     Performed at [facility]   Report Status PENDING   Incomplete  CULTURE, BLOOD (ROUTINE X 2)     Status: None   Collection Time    07/15/13  5:00 PM      Result Value Ref Range Status   Specimen Description BLOOD RIGHT ARM   Final   Special Requests BOTTLES DRAWN AEROBIC AND ANAEROBIC 10 BLUE 5 RED   Final   Culture  Setup Time     Final   Value: 07/15/2013 21:57     Performed at [facility]   Culture     Final   Value:        BLOOD CULTURE RECEIVED NO GROWTH TO DATE CULTURE WILL BE HELD FOR 5 DAYS BEFORE ISSUING A FINAL NEGATIVE REPORT     Performed at [facility]   Report Status PENDING   Incomplete  URINE CULTURE     Status: None   Collection Time    07/15/13  5:32 PM      Result Value Ref Range Status   Specimen Description URINE, CATHETERIZED   Final   Special Requests NONE   Final   Culture  Setup Time     Final   Value: 07/16/2013 03:07     Performed at [facility]   Colony Count     Final   Value: >=100,000 COLONIES/ML     Performed at [facility]   Culture     Final   Value: YEAST     Performed at [facility]   Report Status 07/17/2013 FINAL   Final  URINE CULTURE     Status: None   Collection Time    07/15/13 10:55 PM       Result Value Ref Range Status   Specimen Description URINE, CATHETERIZED   Final   Special Requests NONE   Final   Culture  Setup Time     Final   Value: 07/16/2013 03:07  Performed at [facility] Count     Final   Value: >=100,000 COLONIES/ML     Performed at [facility]   Culture     Final   Value: YEAST     Performed at [facility]   Report Status 07/17/2013 FINAL   Final    Studies/Results: Dg Abd 1 View  07/16/2013   CLINICAL DATA:  Vomiting, abdominal distention.  EXAM: ABDOMEN - 1 VIEW  COMPARISON:  03/12/2013  FINDINGS: The bowel gas pattern is normal. No radio-opaque calculi or other significant radiographic abnormality are seen. No organomegaly. No acute bony abnormality.  IMPRESSION: No acute findings.   Electronically Signed   By: Charlett Nose M.D.   On: 07/16/2013 17:19   Ct Head Wo Contrast  07/15/2013   CLINICAL DATA:  68 year old male with altered mental status and fever.  EXAM: CT HEAD WITHOUT CONTRAST  TECHNIQUE: Contiguous axial images were obtained from the base of the skull through the vertex without intravenous contrast.  COMPARISON:  03/17/2013 and prior head CTs dating back to 06/26/2011  FINDINGS: Mild atrophy again noted.  No acute intracranial abnormalities are identified, including mass lesion or mass effect, hydrocephalus, extra-axial fluid collection, midline shift, hemorrhage, or acute infarction. The visualized bony calvarium is unremarkable.  IMPRESSION: No evidence of acute intracranial abnormality.  Mild atrophy.   Electronically Signed   By: Laveda Abbe M.D.   On: 07/15/2013 17:33   US Renal  07/16/2013   CLINICAL DATA:  Renal failure  EXAM: RENAL/URINARY TRACT ULTRASOUND COMPLETE  COMPARISON:  March 16, 2012  FINDINGS: Right Kidney:  Length: 11.5 cm. Echogenicity within normal limits. No mass or hydronephrosis visualized.  Left Kidney:  Length: 12 cm. Echogenicity within normal limits. No mass or hydronephrosis visualized.   Bladder:  Decompressed with Foley catheter in place.  IMPRESSION: Normal kidneys.  No hydronephrosis bilaterally.   Electronically Signed   By: Sherian Rein M.D.   On: 07/16/2013 23:11   Mr Foot Left W Wo Contrast  06/30/2013   CLINICAL DATA:  Diabetic patient with a nonhealing ulcer of the heel.  EXAM: MRI OF THE LEFT FOREFOOT WITHOUT AND WITH CONTRAST  TECHNIQUE: Multiplanar, multisequence MR imaging was performed both before and after administration of intravenous contrast.  CONTRAST:  10 mL MULTIHANCE GADOBENATE DIMEGLUMINE 529 MG/ML IV SOLN  COMPARISON:  None.  FINDINGS: A large skin ulceration is seen over the heel. There is edema and enhancement in the underlying calcaneus consistent osteomyelitis. Edema and enhancement is most prominent in the posterior 1.5 cm of the calcaneus from top-to-bottom. Signal changed extends 4 cm into the calcaneus at the level of the posterior facet of the subtalar joint. There is extensive subcutaneous edema about the ankle compatible with cellulitis. No abscess is identified. Patchy areas of signal abnormality and enhancement are seen in the distal 4 cm of the Achilles tendon and the tendon is thickened. There is no fluid about the Achilles. Visualized tendons about the ankle are otherwise unremarkable. No ligament tear is identified.  IMPRESSION: Cellulitis about the lower leg and with a large ulceration over the heel. Signal abnormality in the calcaneus as described above is consistent with osteomyelitis. No abscess is identified.  Signal abnormality and thickening in the distal Achilles tendon is consistent with tendinosis. Edema and enhancement in the distal 4 cm of the tendon could be due to infection although there is no fluid about the tendon as is typically seen in infectious  tenosynovitis.   Electronically Signed   By: Drusilla Kanner M.D.   On: 06/30/2013 07:32   Mr Knee Left  Wo Contrast  06/30/2013   CLINICAL DATA:  Left knee pain and limited range of  motion.  EXAM: MRI OF THE LEFT KNEE WITHOUT CONTRAST  TECHNIQUE: Multiplanar, multisequence MR imaging of the knee was performed. No intravenous contrast was administered.  COMPARISON:  None.  FINDINGS: Please note that IV contrast was not administered as the patient could not tolerate further scanning. Patient motion somewhat degrades this exam.  MENISCI  Medial meniscus: There is an oblique tear in the posterior horn of the medial meniscus reaching the meniscal undersurface. No displaced fragment.  Lateral meniscus:  Intact.  LIGAMENTS  Cruciates:  Intact.  Collaterals:  Intact.  CARTILAGE  Patellofemoral:  Unremarkable.  Medial:  Unremarkable.  Lateral:  Unremarkable.  Joint:  Small joint effusion is present.  Popliteal Fossa:  No Baker cyst.  Extensor Mechanism:  Intact.  Bones: Minimal marrow edema is seen the posterior aspect of medial femoral condyle and medial tibial plateau.  IMPRESSION: Minimal marrow edema in the posterior medial femoral condyle and medial tibial plateau is likely related to degenerative disease. The appearance of the knee is not suggestive of septic joint or osteomyelitis.  Oblique tear posterior horn medial meniscus.   Electronically Signed   By: Drusilla Kanner M.D.   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 He is in need of durable central venous access for outpatient intravenous antibiotic therapy. He has a history of poor renal function an so placement of a PICC in upper extremity vein is a deferred. We will proceed with placement of a tunneled IJ PICC for outpatient use.  EXAM: IR ULTRASOUND GUIDANCE VASC ACCESS RIGHT; IR RIGHT FLOURO GUIDE CV LINE  TECHNIQUE: The right neck and chest were prepped with chlorhexidine, draped in the usual sterile fashion using maximum barrier technique (cap and mask, sterile gown, sterile gloves, large sterile sheet, hand hygiene and cutaneous antiseptic).  Local anesthesia was attained by infiltration with 1% lidocaine.  Ultrasound demonstrated patency of the right internal jugular vein, and this was documented with an image. Under real-time ultrasound guidance, this vein was accessed with a 21 gauge micropuncture needle and image documentation was performed. The needle was exchanged over a guidewire for a peel-away sheath through which a 21 cm 5 [place] dual lumen power injectable PICC was advanced, and positioned with its tip at the SVC/right atrial junction. Fluoroscopy during the procedure and fluoro spot radiograph confirms appropriate catheter position. The catheter was flushed, secured to the skin with Prolene sutures, and covered with a sterile dressing.  FLUOROSCOPY TIME:  18 seconds  COMPLICATIONS: None.  The patient tolerated the procedure well.  IMPRESSION: Successful placement of a right IJ tunneled dual-lumen power PICC with sonographic and fluoroscopic guidance. The catheter is ready for use.  Signed,  Sterling Big, MD  [facility]  [facility]   Electronically Signed   By: Malachy Moan M.D.   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 He is in need of durable central venous access for outpatient intravenous antibiotic therapy. He has a history of poor renal function an so placement of a PICC in upper extremity vein is a deferred. We will proceed  with placement of a tunneled IJ PICC for outpatient use.  EXAM: IR ULTRASOUND GUIDANCE VASC ACCESS RIGHT; IR RIGHT FLOURO GUIDE CV LINE  TECHNIQUE: The right neck and chest were prepped with chlorhexidine, draped in the usual sterile fashion using maximum barrier technique (cap and mask, sterile gown, sterile gloves, large sterile sheet, hand hygiene and cutaneous antiseptic). Local anesthesia was attained by infiltration with 1% lidocaine.  Ultrasound demonstrated  patency of the right internal jugular vein, and this was documented with an image. Under real-time ultrasound guidance, this vein was accessed with a 21 gauge micropuncture needle and image documentation was performed. The needle was exchanged over a guidewire for a peel-away sheath through which a 21 cm 5 [place] dual lumen power injectable PICC was advanced, and positioned with its tip at the SVC/right atrial junction. Fluoroscopy during the procedure and fluoro spot radiograph confirms appropriate catheter position. The catheter was flushed, secured to the skin with Prolene sutures, and covered with a sterile dressing.  FLUOROSCOPY TIME:  18 seconds  COMPLICATIONS: None.  The patient tolerated the procedure well.  IMPRESSION: Successful placement of a right IJ tunneled dual-lumen power PICC with sonographic and fluoroscopic guidance. The catheter is ready for use.  Signed,  Sterling Big, MD  [facility]  [facility]   Electronically Signed   By: Malachy Moan M.D.   On: 07/05/2013 10:17   Dg Chest Port 1 View  (if Code Sepsis Called)  07/15/2013   CLINICAL DATA:  Altered mental status.  Fever.  Sepsis.  EXAM: PORTABLE CHEST - 1 VIEW  COMPARISON:  03/22/2013.  FINDINGS: Interval right jugular catheter with its tip at the junction of the superior vena cava and right atrium. Poor inspiration with no gross change in borderline enlargement of the cardiac silhouette. Interval minimal right pleural effusion. Mild prominence of the interstitial markings without significant change when the decreased inspiration is taken into account. Unremarkable bones.  IMPRESSION: 1. Interval minimal right pleural effusion. 2. Stable borderline cardiomegaly. 3. Stable mild chronic interstitial lung disease.   Electronically Signed   By: Gordan Payment M.D.   On: 07/15/2013 17:09   Dg Foot Complete Left  06/29/2013   CLINICAL DATA:  Diabetes.  Left foot pain.  Wound check.  EXAM:  LEFT FOOT - COMPLETE 3+ VIEW  COMPARISON:  None.  FINDINGS: Soft tissue wound noted over the calcaneus. No underlying bony abnormality. No foreign body. Diffuse degenerative change present. No evidence of fracture.  IMPRESSION: Soft tissue wound noted over calcaneus. No evidence of bony abnormality. No lytic bony abnormality. No foreign body.   Electronically Signed   By: Maisie Fus  Register   On: 06/29/2013 16:26    Medications: Scheduled Meds: . carvedilol  3.125 mg Oral BID WC  . enoxaparin (LOVENOX) injection  40 mg Subcutaneous Q24H  . feeding supplement (GLUCERNA SHAKE)  237 mL Oral TID BM  . feeding supplement (PRO-STAT SUGAR FREE 64)  30 mL Oral Q breakfast  . imipenem-cilastatin  500 mg Intravenous 3 times per day  . insulin aspart  0-9 Units Subcutaneous TID WC  . naLOXone (NARCAN)  injection  0.4 mg Intravenous Once  . pantoprazole  40 mg Oral Daily  . sodium chloride  500 mL Intravenous Once  . sodium chloride  3 mL Intravenous Q12H  . tamsulosin  0.4 mg Oral Daily  . triamcinolone cream  1 application Topical TID  . [START ON 07/21/2013] vancomycin  750 mg Intravenous Q12H  LOS: 5 days   Jaslin Novitski M.D. [facility] 07/20/2013, 2:40 PM Pager: [phone]  If 7PM-7AM, please contact night-coverage [email or web address] Password [email or web address]

## 2013-07-20 NOTE — Progress Notes (Signed)
Occupational Therapy Treatment Patient Details Name: Justin HansenDonald Cullinan Sr. MRN: [id number] DOB: 01/31/1946 Today's Date: 07/20/2013 Time: [id number] OT Time Calculation (min): 44 min  OT Assessment / Plan / Recommendation  History of present illness 68 y.o. male [facility] with multiple medical problems including diabetes, hypertension, chronic diastolic CHF with left foot osteomyelitis, abscess s/p I&D by Dr. Magnus IvanBlackman on 07/02/2013.  He comes in this visit with AMS, fever, lethargy.  Dx with acute encepholopathy, metabolic acidosis, sepsis, hypotension, hyperkalemia, osteomyelitis of his left foot, UTI, acute on chronic renal failure, anemia (getting blood transfusion 07/17/13).  He is likely going to require BKA of his left foot this admission.  Ortho following.  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     OT comments  Attempted x 44 minutes to get pt OOB. Assisted nsg with changing bandage due to soiled bandage. Called and had pt premedicated prior to session. Attempted to let pt do more of his own moving, but pt apparentlybecame agitated and would not get to side of bed. Educated pt on importance of mobility. Pt stated he knoe "exactly waht he needed to do and this was not it". Will attempt again. If pt not participating, will D/C from OT and defer all further OT to SNF.   Follow Up Recommendations  Supervision/Assistance - 24 hour;SNF    Barriers to Discharge       Equipment Recommendations  None recommended by OT    Recommendations for Other Services    Frequency Min 1X/week   Progress towards OT Goals Progress towards OT goals: Not progressing toward goals - comment  Plan Discharge plan needs to be updated    Precautions / Restrictions Precautions Precautions: Fall Precaution Comments: at risk for skin breakdown Required Braces or Orthoses: Other Brace/Splint Other Brace/Splint: R pevelon Restrictions LLE Weight Bearing: Non weight bearing   Pertinent  Vitals/Pain Pain L LE but did not rate    ADL  ADL Comments: Assisted nsg with changing soiled LLE bandage as it was soinled with BM. LLE rewrapped with compression wrap. Attempted be3d mobility    OT Diagnosis:    OT Problem List:   OT Treatment Interventions:     OT Goals(current goals can now be found in the care plan section) Acute Rehab OT Goals Patient Stated Goal: to decrease pain OT Goal Formulation: With patient Time For Goal Achievement: 07/31/13 Potential to Achieve Goals: Fair ADL Goals Pt Will Perform Grooming: with supervision;sitting Pt Will Perform Upper Body Bathing: with supervision;sitting Pt Will Perform Lower Body Bathing: with max assist;sitting/lateral leans Pt Will Transfer to Toilet: squat pivot transfer;with +2 assist Pt Will Perform Toileting - Clothing Manipulation and hygiene: with max assist;sitting/lateral leans  Visit Information  Last OT Received On: 07/20/13 Assistance Needed: +2 Reason Eval/Treat Not Completed: Patient declined, no reason specified (will attempt again this pm) History of Present Illness: 68 y.o. male [facility] with multiple medical problems including diabetes, hypertension, chronic diastolic CHF with left foot osteomyelitis, abscess s/p I&D by Dr. Magnus IvanBlackman on 07/02/2013.  He comes in this visit with AMS, fever, lethargy.  Dx with acute encepholopathy, metabolic acidosis, sepsis, hypotension, hyperkalemia, osteomyelitis of his left foot, UTI, acute on chronic renal failure, anemia (getting blood transfusion 07/17/13).  He is likely going to require BKA of his left foot this admission.  Ortho following.  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      Subjective  Data      Prior Functioning       Cognition  Cognition Arousal/Alertness: Awake/alert Behavior During Therapy: Flat affect;Agitated Overall Cognitive Status: No family/caregiver present to determine baseline cognitive functioning (appears  impaired)    Mobility  Bed Mobility Overal bed mobility: Needs Assistance;+2 for physical assistance General bed mobility comments: PT kept on insisting on movinghis legs himself, but barely moved eitherleg after 10 minutes Transfers Overall transfer level: Needs assistance General transfer comment: Rec nsg use maximove    Exercises    Balance General Comments General comments (skin integrity, edema, etc.): Pt reports that his dressing around his residual left limb seems very tight at the top (proximally on his thigh.  PT assess and unraveled dressing that had rolled down and in the process noticed that the wrapping had become soiled by a previous BM.  RN made aware.    End of Session OT - End of Session Activity Tolerance: Patient limited by fatigue;Other (comment) (self limiting) Patient left: in bed;with call bell/phone within reach;with bed alarm set Nurse Communication: Mobility status;Other (comment) (pt's not getting OOB)  GO     Elysha Daw,HILLARY 07/20/2013, 2:44 PM [facility], OTR/L  [phone] 07/20/2013

## 2013-07-20 NOTE — Progress Notes (Signed)
Subjective: 2 Days Post-Op Procedure(s) (LRB): AMPUTATION BELOW KNEE (Left) Patient reports pain as mild.  Complaining of dressing left leg too tight at top of leg.  Objective: Vital signs in last 24 hours: Temp:  [97.5 F (36.4 C)-99.7 F (37.6 C)] 99.7 F (37.6 C) (02/12 0515) Pulse Rate:  [78-95] 83 (02/12 0730) Resp:  [18-20] 18 (02/12 0515) BP: (124-156)/(54-72) 135/54 mmHg (02/12 0730) SpO2:  [92 %-98 %] 92 % (02/12 0515)  Intake/Output from previous day: 02/11 0701 - 02/12 0700 In: -  Out: 450 [Urine:450] Intake/Output this shift:     Recent Labs  07/18/13 0600 07/18/13 2200 07/19/13 0416  HGB 8.2* 8.4* 8.3*    Recent Labs  07/18/13 0600 07/18/13 2200 07/19/13 0416  WBC 7.2  --  8.3  RBC 3.07*  --  3.08*  HCT 25.7* 25.9* 25.9*  PLT 228  --  253    Recent Labs  07/19/13 0416 07/20/13 0603  NA 149* 149*  K 4.3 4.4  CL 114* 115*  CO2 24 24  BUN 32* 28*  CREATININE 2.14* 1.94*  GLUCOSE 144* 170*  CALCIUM 7.9* 7.5*   No results found for this basename: LABPT, INR,  in the last 72 hours  Incision: dressing C/D/I Compartment soft Dressing left leg loose at top able to get hand inside dressing   Assessment/Plan: 2 Days Post-Op Procedure(s) (LRB): AMPUTATION BELOW KNEE (Left) Keep dressing clean dry and intact Continue antibiotics Follow-up with Dr. Magnus IvanBlackman in office at 2 weeks post-op  [name], [name] 07/20/2013, 11:51 AM

## 2013-07-20 NOTE — Progress Notes (Signed)
ANTIBIOTIC CONSULT NOTE - FOLLOW UP  Pharmacy Consult for Vancomycin and Primaxin Indication: L foot osteo  Allergies  Allergen Reactions  . Ancef [Cefazolin] Itching and Rash  . Sulfa Antibiotics Itching and Rash    Patient Measurements: Height: 5\' 11"  (180.3 cm) Weight: 219 lb 8 oz (99.565 kg) IBW/kg (Calculated) : 75.3  Vital Signs: Temp: 99.7 F (37.6 C) (02/12 0515) Temp src: Oral (02/12 0515) BP: 135/54 mmHg (02/12 0730) Pulse Rate: 83 (02/12 0730) Intake/Output from previous day: 02/11 0701 - 02/12 0700 In: -  Out: 450 [Urine:450] Intake/Output from this shift:    Labs:  Recent Labs  07/18/13 0600 07/18/13 2200 07/19/13 0416 07/20/13 0603  WBC 7.2  --  8.3  --   HGB 8.2* 8.4* 8.3*  --   PLT 228  --  253  --   CREATININE 2.57*  --  2.14* 1.94*   Estimated Creatinine Clearance: 43.8 ml/min (by C-G formula based on Cr of 1.94).  Recent Labs  07/18/13 0600  VANCORANDOM 21.8     Microbiology: Recent Results (from the past 720 hour(s))  CULTURE, BLOOD (ROUTINE X 2)     Status: None   Collection Time    06/29/13  9:14 PM      Result Value Ref Range Status   Specimen Description BLOOD RIGHT HAND   Final   Special Requests BOTTLES DRAWN AEROBIC ONLY 2ML   Final   Culture  Setup Time     Final   Value: 06/30/2013 01:51     Performed at [facility]   Culture     Final   Value: NO GROWTH 5 DAYS     Performed at [facility]   Report Status 07/06/2013 FINAL   Final  CULTURE, BLOOD (ROUTINE X 2)     Status: None   Collection Time    06/29/13  9:25 PM      Result Value Ref Range Status   Specimen Description BLOOD RIGHT HAND   Final   Special Requests BOTTLES DRAWN AEROBIC ONLY 2ML   Final   Culture  Setup Time     Final   Value: 06/30/2013 01:51     Performed at [facility]   Culture     Final   Value: NO GROWTH 5 DAYS     Performed at [facility]   Report Status 07/06/2013 FINAL   Final  URINE CULTURE      Status: None   Collection Time    06/29/13  9:38 PM      Result Value Ref Range Status   Specimen Description URINE, CLEAN CATCH   Final   Special Requests NONE   Final   Culture  Setup Time     Final   Value: 06/30/2013 03:46     Performed at [facility]   Colony Count     Final   Value: 50,000 COLONIES/ML     Performed at [facility]   Culture     Final   Value: Multiple bacterial morphotypes present, none predominant. Suggest appropriate recollection if clinically indicated.     Performed at [facility]   Report Status 07/01/2013 FINAL   Final  MRSA PCR SCREENING     Status: None   Collection Time    06/30/13  1:04 AM      Result Value Ref Range Status   MRSA by PCR NEGATIVE  NEGATIVE Final   Comment:  The GeneXpert MRSA Assay (FDA     approved for NASAL specimens     only), is one component of a     comprehensive MRSA colonization     surveillance program. It is not     intended to diagnose MRSA     infection nor to guide or     monitor treatment for     MRSA infections.  SURGICAL PCR SCREEN     Status: None   Collection Time    07/02/13  3:54 AM      Result Value Ref Range Status   MRSA, PCR NEGATIVE  NEGATIVE Final   Staphylococcus aureus NEGATIVE  NEGATIVE Final   Comment:            The Xpert SA Assay (FDA     approved for NASAL specimens     in patients over 77 years of age),     is one component of     a comprehensive surveillance     program.  Test performance has     been validated by [employer] for patients greater     than or equal to 44 year old.     It is not intended     to diagnose infection nor to     guide or monitor treatment.  CULTURE, BLOOD (ROUTINE X 2)     Status: None   Collection Time    07/15/13  4:44 PM      Result Value Ref Range Status   Specimen Description BLOOD RIGHT HAND   Final   Special Requests BOTTLES DRAWN AEROBIC AND ANAEROBIC 5 CC   Final   Culture  Setup Time     Final    Value: 07/15/2013 21:56     Performed at [facility]   Culture     Final   Value:        BLOOD CULTURE RECEIVED NO GROWTH TO DATE CULTURE WILL BE HELD FOR 5 DAYS BEFORE ISSUING A FINAL NEGATIVE REPORT     Performed at [facility]   Report Status PENDING   Incomplete  CULTURE, BLOOD (ROUTINE X 2)     Status: None   Collection Time    07/15/13  5:00 PM      Result Value Ref Range Status   Specimen Description BLOOD RIGHT ARM   Final   Special Requests BOTTLES DRAWN AEROBIC AND ANAEROBIC 10 BLUE 5 RED   Final   Culture  Setup Time     Final   Value: 07/15/2013 21:57     Performed at [facility]   Culture     Final   Value:        BLOOD CULTURE RECEIVED NO GROWTH TO DATE CULTURE WILL BE HELD FOR 5 DAYS BEFORE ISSUING A FINAL NEGATIVE REPORT     Performed at [facility]   Report Status PENDING   Incomplete  URINE CULTURE     Status: None   Collection Time    07/15/13  5:32 PM      Result Value Ref Range Status   Specimen Description URINE, CATHETERIZED   Final   Special Requests NONE   Final   Culture  Setup Time     Final   Value: 07/16/2013 03:07     Performed at [facility] Count     Final   Value: >=100,000 COLONIES/ML     Performed at [facility]   Culture     Final   Value: YEAST     Performed at [facility]   Report Status 07/17/2013 FINAL   Final  URINE CULTURE     Status: None   Collection Time    07/15/13 10:55 PM      Result Value Ref Range Status   Specimen Description URINE, CATHETERIZED   Final   Special Requests NONE   Final   Culture  Setup Time     Final   Value: 07/16/2013 03:07     Performed at [facility] Count     Final   Value: >=100,000 COLONIES/ML     Performed at [facility]   Culture     Final   Value: YEAST     Performed at [facility]   Report Status 07/17/2013 FINAL   Final    Anti-infectives   Start     Dose/Rate Route Frequency  Ordered Stop   07/18/13 1200  vancomycin (VANCOCIN) IVPB 1000 mg/200 mL premix     1,000 mg 200 mL/hr over 60 Minutes Intravenous Every 48 hours 07/18/13 1003     07/16/13 2200  vancomycin (VANCOCIN) 1,500 mg in sodium chloride 0.9 % 500 mL IVPB  Status:  Discontinued     1,500 mg 250 mL/hr over 120 Minutes Intravenous Every 24 hours 07/15/13 2041 07/16/13 0845   07/15/13 2200  imipenem-cilastatin (PRIMAXIN) 250 mg in sodium chloride 0.9 % 100 mL IVPB     250 mg 200 mL/hr over 30 Minutes Intravenous Every 6 hours 07/15/13 2041     07/15/13 1715  vancomycin (VANCOCIN) IVPB 1000 mg/200 mL premix     1,000 mg 200 mL/hr over 60 Minutes Intravenous  Once 07/15/13 1703 07/15/13 1842   07/15/13 1715  piperacillin-tazobactam (ZOSYN) IVPB 3.375 g     3.375 g 100 mL/hr over 30 Minutes Intravenous  Once 07/15/13 1703 07/15/13 1753      Assessment: 68 yo M on Vancomycin therapy PTA for planned 6 week course for L foot osteo/abscess.  Returns to hospital with AMS, confusion, and hypotension.  Pharmacy consulted to continue IV antibiotics.  Of note, patient was on Vancomycin 750mg  IV q18h with the last dose 2/7 0900.  Pt received additional Vancomycin 1gm on presentation to the ED 2/7 1700.    VT 1/28 23.8 (random level) - sent home on 1500 mg IV q48h VR 2/8 1830 = 31 VR 2/9 0750 = 27.9 (ke=0.008, t1/2=86h) VR 2/10 0600 = 21.8 (ke=0.01 t1/2=61h)  Patient's changing renal function is complicating the calculations, with SCr improving from 3.4-->3.06-->2.57.  Noted baseline SCr ~ 2.6.  Renal function has improved significantly over the last week with SCr now 1.94.  Calculations based on patient specific ke are no longer relevant.  Will need dose adjustment.  Patient is also POD#2 L BKA.  Goal of Therapy:  Vancomycin trough level 15-20 mcg/ml Eradication of infection  Plan:  Vancomycin 750 mg IV q12h - next dose at midnight Change Primaxin 500 mg IV q8h. Follow renal function and further  adjust dose if needed. What is the desired abd length of therapy now that the source of infection has been removed?  [employer], Pharm.D., BCPS Clinical Pharmacist Pager [phone] 07/20/2013 1:55 PM

## 2013-07-21 ENCOUNTER — Other Ambulatory Visit: Payer: Self-pay | Admitting: *Deleted

## 2013-07-21 DIAGNOSIS — A419 Sepsis, unspecified organism: Secondary | ICD-10-CM

## 2013-07-21 LAB — TYPE AND SCREEN
ABO/RH(D): B POS
ANTIBODY SCREEN: NEGATIVE
UNIT DIVISION: 0
Unit division: 0

## 2013-07-21 LAB — CBC
HCT: 24.5 % — ABNORMAL LOW (ref 39.0–52.0)
HEMOGLOBIN: 7.9 g/dL — AB (ref 13.0–17.0)
MCH: 27.1 pg (ref 26.0–34.0)
MCHC: 32.2 g/dL (ref 30.0–36.0)
MCV: 84.2 fL (ref 78.0–100.0)
PLATELETS: 254 10*3/uL (ref 150–400)
RBC: 2.91 MIL/uL — AB (ref 4.22–5.81)
RDW: 15.2 % (ref 11.5–15.5)
WBC: 8.9 10*3/uL (ref 4.0–10.5)

## 2013-07-21 LAB — CULTURE, BLOOD (ROUTINE X 2)
Culture: NO GROWTH
Culture: NO GROWTH

## 2013-07-21 LAB — GLUCOSE, CAPILLARY
GLUCOSE-CAPILLARY: 120 mg/dL — AB (ref 70–99)
GLUCOSE-CAPILLARY: 162 mg/dL — AB (ref 70–99)
Glucose-Capillary: 146 mg/dL — ABNORMAL HIGH (ref 70–99)

## 2013-07-21 LAB — BASIC METABOLIC PANEL
BUN: 25 mg/dL — ABNORMAL HIGH (ref 6–23)
CHLORIDE: 112 meq/L (ref 96–112)
CO2: 24 meq/L (ref 19–32)
Calcium: 7.7 mg/dL — ABNORMAL LOW (ref 8.4–10.5)
Creatinine, Ser: 1.86 mg/dL — ABNORMAL HIGH (ref 0.50–1.35)
GFR calc Af Amer: 41 mL/min — ABNORMAL LOW (ref >90)
GFR calc non Af Amer: 36 mL/min — ABNORMAL LOW (ref >90)
GLUCOSE: 153 mg/dL — AB (ref 70–99)
POTASSIUM: 4.6 meq/L (ref 3.7–5.3)
SODIUM: 147 meq/L (ref 137–147)

## 2013-07-21 LAB — HEMOGLOBIN AND HEMATOCRIT, BLOOD
HCT: 28.6 % — ABNORMAL LOW (ref 39.0–52.0)
Hemoglobin: 9.3 g/dL — ABNORMAL LOW (ref 13.0–17.0)

## 2013-07-21 LAB — PREPARE RBC (CROSSMATCH)

## 2013-07-21 MED ORDER — GLUCERNA SHAKE PO LIQD: 237.0000 mL | Freq: Three times a day (TID) | ORAL | Status: DC

## 2013-07-21 MED ORDER — HYDROCODONE-ACETAMINOPHEN 5-325 MG PO TABS: 1.0000 | ORAL_TABLET | ORAL | Status: DC | PRN

## 2013-07-21 MED ORDER — HYDROCODONE-ACETAMINOPHEN 5-325 MG PO TABS: ORAL_TABLET | ORAL | Status: DC

## 2013-07-21 NOTE — Progress Notes (Signed)
Patient ID: Justin HansenDonald Steffler Sr., male   DOB: 09/24/1945, 68 y.o.   MRN: [id number] Came by and changed the left BKA stump dressing at the bedside.  His suture line looks good and the tissue is nice and viable.  A new dressing was applied.  No new recs.  Mobilize as appropriate.

## 2013-07-21 NOTE — Discharge Summary (Signed)
Physician Discharge Summary  Patient ID: [name] Sr. MRN: [id number] DOB/AGE: 1946-05-05 68 y.o.  Admit date: 07/15/2013 Discharge date: 07/21/2013  Primary Care Physician:  [name], MD  Discharge Diagnoses:    . 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 Acute encephalopathy- resolved  . Physical deconditioning . DIABETES MELLITUS, WITH VASCULAR COMPLICATIONS . Chronic diastolic CHF (congestive heart failure) . Hypotension improved  . Chronic renal failure, stage 4 (severe) . UTI (urinary tract infection)  . Anemia, chronic likely due to chronic kidney disease, diabetes, worsened postoperatively  Consults: Orthopedics, Dr. Allie Bossier   Recommendations for Outpatient Follow-up:  Please check CBC, BMET next week  Please make followup appointment with Dr. Magnus Ivan in 2 weeks  Allergies:   Allergies  Allergen Reactions  . Ancef [Cefazolin] Itching and Rash  . Sulfa Antibiotics Itching and Rash     Discharge Medications:   Medication List    STOP taking these medications       furosemide 80 MG tablet  Commonly known as:  LASIX     insulin glargine 100 UNIT/ML injection  Commonly known as:  LANTUS     levofloxacin 750 MG tablet  Commonly known as:  LEVAQUIN     metolazone 5 MG tablet  Commonly known as:  ZAROXOLYN     potassium chloride SA 20 MEQ tablet  Commonly known as:  K-DUR,KLOR-CON     sodium chloride 0.9 % SOLN 150 mL with vancomycin 1000 MG SOLR 750 mg      TAKE these medications       carvedilol 12.5 MG tablet  Commonly known as:  COREG  Take 1 tablet (12.5 mg total) by mouth 2 (two) times daily with a meal.     CENTRUM SILVER ADULT 50+ PO  Take 0.5 tablets by mouth daily.     feeding supplement (GLUCERNA SHAKE) Liqd  Take 237 mLs by mouth 3 (three) times daily between meals.     feeding supplement (PRO-STAT SUGAR FREE 64) Liqd  Take 30 mLs by mouth daily with breakfast.      HYDROcodone-acetaminophen 5-325 MG per tablet  Commonly known as:  NORCO/VICODIN  Take 1 tablet by mouth every 4 (four) hours as needed for moderate pain.     insulin aspart 100 UNIT/ML injection  Commonly known as:  novoLOG  Inject into the skin 3 (three) times daily with meals. SSI 200-300   7 UNITS; 301-400  9 UNITS; 401-500   12  UNITS; > 500 14  UNITS AND CALL MD   CBG AC AND QHS     MUSCLE RUB 10-15 % Crea  Apply 1 application topically as needed (neck and shoulder pain).     omeprazole 20 MG capsule  Commonly known as:  PRILOSEC  Take 20 mg by mouth daily.     ondansetron 4 MG tablet  Commonly known as:  ZOFRAN  Take 1 tablet (4 mg total) by mouth every 6 (six) hours as needed for nausea.     tamsulosin 0.4 MG Caps capsule  Commonly known as:  FLOMAX  Take 1 capsule (0.4 mg total) by mouth daily.     triamcinolone cream 0.1 %  Commonly known as:  KENALOG  Apply 1 application topically 3 (three) times daily. On back and bottom         Brief H and P: For complete details please refer to admission H and P, but in brief Patient is a 68 year old African  American male with multiple medical problems including diabetes, hypertension, chronic diastolic CHF with left foot osteomyelitis, abscess s/p I&D by Dr. Magnus Ivan on 07/02/2013, on vancomycin and Levaquin, currently at [facility] skilled nursing facility (discharged on 07/05/13 from [facility]) was brought for acute mental status changes. History was obtained from the patient's wife in the room who stated that she was visiting him around 3:30 PM on the day of admission when she found him to be lethargic, had a temp of 101.3. He was having chills. BP was found to be low in 80s. He is usually alert but was lethargic. In the ER patient was given Narcan 0.4 mg x2 with improvement in his mental status. Patient was also given IV fluid bolus of 500 cc. Lactate 1.7.  At the time of admission, vital signs had improved, HR 78, RR 16 BP 103/53, O2 sats  96% on room air. Patient was able to follow commands but still confused.  UA shows gross UTI, chest x-ray shows no pneumonia and minimal right-sided pleural effusion, borderline cardiomegaly.      Hospital Course:   68 year old with PMH significant for Osteomyelitis of left foot, S/P ID by Dr Magnus Ivan last month, was receiving IV antibiotics at [facility], admitted 2-07 with encephalopathy, confusion, sepsis like picture, worsening renal function. Dr Magnus Ivan was consulted for further evaluation of osteomyelitis.  1 Sepsis, Hypotension; Patient presented with fever, hypotension, tachycardia. urine culture only grew yeast. Orthopedic was consulted for further evaluation of left foot osteomyelitis. Diuretics were held, patient was given IV fluid hydration. blood culture no growth to date. Source of the sepsis likely left foot osteomyelitis, orthopedics was consulted and patient underwent left BKA done on 07/18/2013. He has received IV vancomycin and imipenem since admission and was on IV vancomycin and levofloxacin prior to admission. He has been on more than 4 weeks of antibiotics. I discussed in detail with Dr. Magnus Ivan at discharge who feels that patient does not need any further antibiotics as he already has left BKA plus, and his wound is nicely healing, dressing changed today.   2-Acute on Chronic renal failure; in setting of hypotension , sepsis. Creatinine was 3.46 at worst, Cr baseline 1.9.  Currently at baseline, patient was provided gentle hydration and diuretics were held. Renal US showed no hydronephrosis.   3-hypernatremia Na 149 likely due to normal saline IV fluids. Metabolic acidosis had resolved due to IV fluids. Patient was placed on D5 water, sodium was improving at 147 at discharge.   4 Acute Encephalopathy: in setting of fever, infection, hypotension, now significantly improved. CT head was done and showed mild atrophy, no acute intracranial abnormality. Continue with support care.    5-Hyperkalemia; resolved with IV fluids, bicarb.   6-UTI; UA with Too numerous to count WBC. Now on imipenem. Urine culture growing yeast.   7-Osteomyelitis of the left foot  - Patient had recent I&D done by Dr. Magnus Ivan, Left BKA done on 07/18/2013, follow up outpatient with Dr. Magnus Ivan is  8-DIABETES MELLITUS, WITH VASCULAR COMPLICATIONS -continue sliding scale insulin. Hold lantus due to poor oral intake.   9-Chronic diastolic CHF (congestive heart failure) - Currently compensated, hold Lasix and metolazone.   10-Abdominal distension; KUB negative.   11-Anemia; - Received one unit PRBC 2/09, one unit of packed RBC on 07/21/13      Day of Discharge BP 109/69  Pulse 101  Temp(Src) 98.6 F (37 C) (Oral)  Resp 18  Ht 5\' 11"  (1.803 m)  Wt 99.565 kg (219  lb 8 oz)  BMI 30.63 kg/m2  SpO2 99%  Physical Exam:  General: Alert and oriented  CVS: S1-S2 clear, no murmur rubs or gallops  Chest: clear to auscultation bilaterally  Abdomen: soft nontender, mildly distended, normal bowel sounds  Extremities: Left BKA, dressing intact, right lower extremity in a heel protector  The results of significant diagnostics from this hospitalization (including imaging, microbiology, ancillary and laboratory) are listed below for reference.    LAB RESULTS: Basic Metabolic Panel:  Recent Labs Lab 07/20/13 0603 07/21/13 0500  NA 149* 147  K 4.4 4.6  CL 115* 112  CO2 24 24  GLUCOSE 170* 153*  BUN 28* 25*  CREATININE 1.94* 1.86*  CALCIUM 7.5* 7.7*   Liver Function Tests:  Recent Labs Lab 07/15/13 1645  AST 12  ALT <5  ALKPHOS 64  BILITOT 0.2*  PROT 7.7  ALBUMIN 2.4*   No results found for this basename: LIPASE, AMYLASE,  in the last 168 hours No results found for this basename: AMMONIA,  in the last 168 hours CBC:  Recent Labs Lab 07/15/13 1645  07/19/13 0416 07/21/13 0500  WBC 9.0  < > 8.3 8.9  NEUTROABS 6.6  --   --   --   HGB 8.4*  < > 8.3* 7.9*  HCT 25.9*  <  > 25.9* 24.5*  MCV 85.8  < > 84.1 84.2  PLT 266  < > 253 254  < > = values in this interval not displayed. Cardiac Enzymes:  Recent Labs Lab 07/15/13 1659  TROPONINI <0.30   BNP: No components found with this basename: POCBNP,  CBG:  Recent Labs Lab 07/21/13 0749 07/21/13 1140  GLUCAP 120* 146*    Significant Diagnostic Studies:  Dg Abd 1 View  07/16/2013   CLINICAL DATA:  Vomiting, abdominal distention.  EXAM: ABDOMEN - 1 VIEW  COMPARISON:  03/12/2013  FINDINGS: The bowel gas pattern is normal. No radio-opaque calculi or other significant radiographic abnormality are seen. No organomegaly. No acute bony abnormality.  IMPRESSION: No acute findings.   Electronically Signed   By: Charlett NoseKevin  Dover M.D.   On: 07/16/2013 17:19   Ct Head Wo Contrast  07/15/2013   CLINICAL DATA:  68 year old male with altered mental status and fever.  EXAM: CT HEAD WITHOUT CONTRAST  TECHNIQUE: Contiguous axial images were obtained from the base of the skull through the vertex without intravenous contrast.  COMPARISON:  03/17/2013 and prior head CTs dating back to 06/26/2011  FINDINGS: Mild atrophy again noted.  No acute intracranial abnormalities are identified, including mass lesion or mass effect, hydrocephalus, extra-axial fluid collection, midline shift, hemorrhage, or acute infarction. The visualized bony calvarium is unremarkable.  IMPRESSION: No evidence of acute intracranial abnormality.  Mild atrophy.   Electronically Signed   By: Laveda AbbeJeff  Hu M.D.   On: 07/15/2013 17:33   [place] Renal  07/16/2013   CLINICAL DATA:  Renal failure  EXAM: RENAL/URINARY TRACT ULTRASOUND COMPLETE  COMPARISON:  March 16, 2012  FINDINGS: Right Kidney:  Length: 11.5 cm. Echogenicity within normal limits. No mass or hydronephrosis visualized.  Left Kidney:  Length: 12 cm. Echogenicity within normal limits. No mass or hydronephrosis visualized.  Bladder:  Decompressed with Foley catheter in place.  IMPRESSION: Normal kidneys.  No  hydronephrosis bilaterally.   Electronically Signed   By: Sherian ReinWei-Chen  Lin M.D.   On: 07/16/2013 23:11   Dg Chest Port 1 View  (if Code Sepsis Called)  07/15/2013   CLINICAL DATA:  Altered mental  status.  Fever.  Sepsis.  EXAM: PORTABLE CHEST - 1 VIEW  COMPARISON:  03/22/2013.  FINDINGS: Interval right jugular catheter with its tip at the junction of the superior vena cava and right atrium. Poor inspiration with no gross change in borderline enlargement of the cardiac silhouette. Interval minimal right pleural effusion. Mild prominence of the interstitial markings without significant change when the decreased inspiration is taken into account. Unremarkable bones.  IMPRESSION: 1. Interval minimal right pleural effusion. 2. Stable borderline cardiomegaly. 3. Stable mild chronic interstitial lung disease.   Electronically Signed   By: Gordan Payment M.D.   On: 07/15/2013 17:09     Disposition and Follow-up: Discharge Orders   Future Orders Complete By Expires   Diet Carb Modified  As directed    Discharge wound care:  As directed    Comments:     Keep dressing on left leg clean dry and intact until follow up with Dr. Magnus Ivan at 2 weeks post-op.   Increase activity slowly  As directed        DISPOSITION: Skilled nursing facility DIET: Carb modified  DISCHARGE FOLLOW-UP Follow-up Information   Follow up with Kathryne Hitch, MD. Schedule an appointment as soon as possible for a visit in 2 weeks.   Specialty:  Orthopedic Surgery   Contact information:   [address] [place] [address] [phone]       Follow up with [facility] K, MD. Schedule an appointment as soon as possible for a visit in 2 weeks. (for hospital follow-up)    Specialty:  Family Medicine   Contact information:   [address] [place] [address] [phone]       Time spent on Discharge: 40 minutes  Signed:   Irfan Veal M.D. [facility] 07/21/2013, 12:59 PM Pager:  [phone]

## 2013-07-21 NOTE — Progress Notes (Signed)
[facility], report called.  H&H drawn, hg 9.3, hct 28.6, pt is stable and ready.

## 2013-07-21 NOTE — Telephone Encounter (Signed)
[facility] 

## 2013-07-21 NOTE — Progress Notes (Signed)
Clinical social worker assisted with patient discharge to skilled nursing facility, [facility].  CSW addressed all family questions and concerns. CSW copied chart and added all important documents. CSW also set up patient transportation with [facility]. Clinical Social Worker will sign off for now as social work intervention is no longer needed.

## 2013-07-22 LAB — TYPE AND SCREEN
ABO/RH(D): B POS
Antibody Screen: NEGATIVE
UNIT DIVISION: 0

## 2013-07-24 ENCOUNTER — Non-Acute Institutional Stay (SKILLED_NURSING_FACILITY): Payer: Medicare Other | Admitting: Internal Medicine

## 2013-07-24 ENCOUNTER — Encounter: Payer: Self-pay | Admitting: Internal Medicine

## 2013-07-24 DIAGNOSIS — D649 Anemia, unspecified: Secondary | ICD-10-CM

## 2013-07-24 DIAGNOSIS — I5032 Chronic diastolic (congestive) heart failure: Secondary | ICD-10-CM

## 2013-07-24 DIAGNOSIS — G9340 Encephalopathy, unspecified: Secondary | ICD-10-CM

## 2013-07-24 DIAGNOSIS — S88119A Complete traumatic amputation at level between knee and ankle, unspecified lower leg, initial encounter: Secondary | ICD-10-CM

## 2013-07-24 DIAGNOSIS — E1159 Type 2 diabetes mellitus with other circulatory complications: Secondary | ICD-10-CM

## 2013-07-24 DIAGNOSIS — N390 Urinary tract infection, site not specified: Secondary | ICD-10-CM

## 2013-07-24 DIAGNOSIS — R5381 Other malaise: Secondary | ICD-10-CM

## 2013-07-24 DIAGNOSIS — N184 Chronic kidney disease, stage 4 (severe): Secondary | ICD-10-CM

## 2013-07-24 DIAGNOSIS — Z89519 Acquired absence of unspecified leg below knee: Secondary | ICD-10-CM

## 2013-07-24 DIAGNOSIS — I509 Heart failure, unspecified: Secondary | ICD-10-CM

## 2013-07-24 NOTE — Assessment & Plan Note (Signed)
Tx 1 unit PRBC 2/9, another 2/13

## 2013-07-24 NOTE — Assessment & Plan Note (Signed)
Resolved with hydration;Cr 3.46 at worst,  Baseline 1.9, down to 1.86 prior to discharge

## 2013-07-24 NOTE — Assessment & Plan Note (Signed)
After > month IV abx and osteo;OT/PT

## 2013-07-24 NOTE — Assessment & Plan Note (Signed)
Grew out yeast, not felt to be source of sepsis

## 2013-07-24 NOTE — Assessment & Plan Note (Addendum)
Source of sepsis was L foot osteoso underwent surgery with resolution; pt had been on 4 weeks abx and after suegery source gone so no more abx needed

## 2013-07-24 NOTE — Assessment & Plan Note (Signed)
Fever, osteo, hypotension- improved;CT head  Showed mild atrophy only

## 2013-07-24 NOTE — Assessment & Plan Note (Signed)
B Blocker only;no Lasix now

## 2013-07-24 NOTE — Progress Notes (Addendum)
MRN: [id number] Name: Justin Sison Sr.  Sex: male Age: 68 y.o. DOB: 07/26/1945  PSC #: [name] Facility/Room: 129A Level Of Care: SNF Provider: Merrilee Seashore D Emergency Contacts: Extended Emergency Contact Information Primary Emergency Contact: Amezcua,[name] Address: [address]          [place], [place] [address] [place] Home Phone: [phone] Relation: Spouse  Code Status: FULL  Allergies: Ancef and Sulfa antibiotics  Chief Complaint  Patient presents with  . nursing home admission    HPI: Patient is 68 y.o. male who is admitted for deconditioning after 1 month abx for osteo of foot and finally L BKA.  Past Medical History  Diagnosis Date  . Diabetes mellitus   . Hypertension   . Hyperlipidemia   . CHF (congestive heart failure)   . Enlarged heart   . Pneumonia     hx of   . Osteomyelitis of foot     LT  . CKD (chronic kidney disease), stage IV     Past Surgical History  Procedure Laterality Date  . Right leg fracture    . Skin graft    . Knee surgery    . Tee without cardioversion  03/17/2012    Procedure: TRANSESOPHAGEAL ECHOCARDIOGRAM (TEE);  Surgeon: Ricki Rodriguez, MD;  Location: [facility] ENDOSCOPY;  Service: Cardiovascular;  Laterality: N/A;  . Incision and drainage Left 07/02/2013    Procedure: INCISION AND DRAINAGE LEFT FOOT;  Surgeon: Kathryne Hitch, MD;  Location: [facility] ORS;  Service: Orthopedics;  Laterality: Left;  . Amputation Left 07/18/2013    Procedure: AMPUTATION BELOW KNEE;  Surgeon: Kathryne Hitch, MD;  Location: [facility] OR;  Service: Orthopedics;  Laterality: Left;      Medication List       This list is accurate as of: 07/24/13  8:11 PM.  Always use your most recent med list.               carvedilol 12.5 MG tablet  Commonly known as:  COREG  Take 1 tablet (12.5 mg total) by mouth 2 (two) times daily with a meal.     CENTRUM SILVER ADULT 50+ PO  Take 0.5 tablets by mouth daily.     feeding  supplement (GLUCERNA SHAKE) Liqd  Take 237 mLs by mouth 3 (three) times daily between meals.     feeding supplement (PRO-STAT SUGAR FREE 64) Liqd  Take 30 mLs by mouth daily with breakfast.     HYDROcodone-acetaminophen 5-325 MG per tablet  Commonly known as:  NORCO/VICODIN  Take one tablet by mouth every four hours as needed for moderate pain     insulin aspart 100 UNIT/ML injection  Commonly known as:  novoLOG  Inject into the skin 3 (three) times daily with meals. SSI 200-300   7 UNITS; 301-400  9 UNITS; 401-500   12  UNITS; > 500 14  UNITS AND CALL MD   CBG AC AND QHS     MUSCLE RUB 10-15 % Crea  Apply 1 application topically as needed (neck and shoulder pain).     omeprazole 20 MG capsule  Commonly known as:  PRILOSEC  Take 20 mg by mouth daily.     ondansetron 4 MG tablet  Commonly known as:  ZOFRAN  Take 1 tablet (4 mg total) by mouth every 6 (six) hours as needed for nausea.     tamsulosin 0.4 MG Caps capsule  Commonly known as:  FLOMAX  Take 1 capsule (0.4 mg total)  by mouth daily.     triamcinolone cream 0.1 %  Commonly known as:  KENALOG  Apply 1 application topically 3 (three) times daily. On back and bottom        No orders of the defined types were placed in this encounter.    Immunization History  Administered Date(s) Administered  . Influenza Split 02/21/2012, 02/15/2013  . Pneumococcal Polysaccharide-23 02/21/2012    History  Substance Use Topics  . Smoking status: Former Smoker    Quit date: 03/10/1984  . Smokeless tobacco: Never Used  . Alcohol Use: No    Family history is noncontributory    Review of Systems  DATA OBTAINED: from patient, nurse GENERAL: Feels well no fevers, fatigue, appetite changes SKIN: No itching, rash or wounds EYES: No eye pain, redness, discharge EARS: No earache, tinnitus, change in hearing NOSE: No congestion, drainage or bleeding  MOUTH/THROAT: No mouth or tooth pain, No sore throat, No difficulty chewing or  swallowing  RESPIRATORY: No cough, wheezing, SOB CARDIAC: No chest pain, palpitations, lower extremity edema  GI: No abdominal pain, No N/V/D or constipation, No heartburn or reflux  GU: No dysuria, frequency or urgency, or incontinence  MUSCULOSKELETAL: No unrelieved bone/joint pain NEUROLOGIC: No headache, dizziness or focal weakness PSYCHIATRIC: No overt anxiety or sadness. Sleeps well. No behavior issue.   Filed Vitals:   07/24/13 1941  BP: 97/72  Pulse: 59  Temp: 96.5 F (35.8 C)  Resp: 20    Physical Exam  GENERAL APPEARANCE: Alert, conversant. Appropriately groomed. No acute distress.  SKIN: No diaphoresis rash HEAD: Normocephalic, atraumatic  EYES: Conjunctiva/lids clear. Pupils round, reactive. EOMs intact.  EARS: External exam WNL, canals clear. Hearing grossly normal.  NOSE: No deformity or discharge.  MOUTH/THROAT: Lips w/o lesions RESPIRATORY: Breathing is even, unlabored. Lung sounds are clear   CARDIOVASCULAR: Heart RRR no murmurs, rubs or gallops. No peripheral edema.  GASTROINTESTINAL: Abdomen is soft, non-tender, not distended w/ normal bowel sounds.  GENITOURINARY: Bladder non tender, not distended  MUSCULOSKELETAL: L BKA with dressing in place NEUROLOGIC: Oriented X3. Cranial nerves 2-12 grossly intact. Moves all extremities no tremor. PSYCHIATRIC: Mood and affect appropriate to situation, no behavioral issues  Patient Active Problem List   Diagnosis Date Noted  . S/P BKA (below knee amputation) unilateral 07/24/2013  . Anemia following surgery 07/24/2013  . Acute encephalopathy 07/15/2013  . Hypotension 07/15/2013  . UTI (urinary tract infection) 07/15/2013  . DM (diabetes mellitus), type 2, uncontrolled, periph vascular complic 07/05/2013  . Chronic renal failure, stage 4 (severe) 07/05/2013  . Anemia of chronic disease 07/05/2013  . Open wound of foot except toes with complication 06/29/2013  . Cellulitis and abscess of foot 06/29/2013  .  Hypertension 06/29/2013  . Pulmonary infiltrate 03/22/2013  . Atelectasis 03/22/2013  . Acute on chronic renal failure 03/08/2013  . Hypernatremia 03/08/2013  . Anasarca 03/08/2013  . Physical deconditioning 03/08/2013  . Unable to ambulate 03/08/2013  . Diabetic peripheral neuropathy 03/08/2013  . Bilateral leg edema, L>R 03/08/2013  . Shock 03/08/2013  . History of Bacteremia due to Staphylococcus 03/08/2013  . Tremor 03/08/2013  . Hypokalemia 03/08/2013  . Altered mental status 03/08/2013  . Slurred speech 11/26/2012  . Chronic diastolic CHF (congestive heart failure) 08/04/2011  . Chronic hypertension 08/04/2011  . Pleural effusion, bilateral 08/04/2011  . DIABETES MELLITUS, WITH VASCULAR COMPLICATIONS 05/26/2010    CBC    Component Value Date/Time   WBC 8.9 07/21/2013 0500   RBC 2.91* 07/21/2013 0500  HGB 9.3* 07/21/2013 1850   HCT 28.6* 07/21/2013 1850   PLT 254 07/21/2013 0500   MCV 84.2 07/21/2013 0500   LYMPHSABS 0.6* 07/15/2013 1645   MONOABS 1.0 07/15/2013 1645   EOSABS 0.9* 07/15/2013 1645   BASOSABS 0.0 07/15/2013 1645    CMP     Component Value Date/Time   NA 147 07/21/2013 0500   K 4.6 07/21/2013 0500   CL 112 07/21/2013 0500   CO2 24 07/21/2013 0500   GLUCOSE 153* 07/21/2013 0500   BUN 25* 07/21/2013 0500   CREATININE 1.86* 07/21/2013 0500   CALCIUM 7.7* 07/21/2013 0500   PROT 7.7 07/15/2013 1645   ALBUMIN 2.4* 07/15/2013 1645   AST 12 07/15/2013 1645   ALT <5 07/15/2013 1645   ALKPHOS 64 07/15/2013 1645   BILITOT 0.2* 07/15/2013 1645   GFRNONAA 36* 07/21/2013 0500   GFRAA 41* 07/21/2013 0500    Assessment and Plan  S/P BKA (below knee amputation) unilateral Source of sepsis was L foot osteoso underwent surgery with resolution; pt had been on 4 weeks abx and after suegery source gone so no more abx needed  Chronic renal failure, stage 4 (severe) Resolved with hydration;Cr 3.46 at worst,  Baseline 1.9, down to 1.86 prior to discharge  Acute encephalopathy Fever, osteo,  hypotension- improved;CT head  Showed mild atrophy only  UTI (urinary tract infection) Grew out yeast, not felt to be source of sepsis  DIABETES MELLITUS, WITH VASCULAR COMPLICATIONS Lantus held sec to poor po intake;will reinstate when necessary, SSI until then;  HbA1c 8.0  Chronic diastolic CHF (congestive heart failure) B Blocker only;no Lasix now  Anemia following surgery Tx 1 unit PRBC 2/9, another 2/13  Physical deconditioning After > month IV abx and osteo;OT/PT    Margit HanksALEXANDER, Stanley Helmuth D, MD

## 2013-07-24 NOTE — Assessment & Plan Note (Signed)
Lantus held sec to poor po intake;will reinstate when necessary, SSI until then;  HbA1c 8.0

## 2013-07-30 ENCOUNTER — Encounter: Payer: Self-pay | Admitting: Internal Medicine

## 2013-08-15 ENCOUNTER — Non-Acute Institutional Stay (SKILLED_NURSING_FACILITY): Payer: Medicare Other | Admitting: Nurse Practitioner

## 2013-08-15 DIAGNOSIS — E1165 Type 2 diabetes mellitus with hyperglycemia: Principal | ICD-10-CM

## 2013-08-15 DIAGNOSIS — I5032 Chronic diastolic (congestive) heart failure: Secondary | ICD-10-CM

## 2013-08-15 DIAGNOSIS — N184 Chronic kidney disease, stage 4 (severe): Secondary | ICD-10-CM

## 2013-08-15 DIAGNOSIS — Z89519 Acquired absence of unspecified leg below knee: Secondary | ICD-10-CM

## 2013-08-15 DIAGNOSIS — S88119A Complete traumatic amputation at level between knee and ankle, unspecified lower leg, initial encounter: Secondary | ICD-10-CM

## 2013-08-15 DIAGNOSIS — I509 Heart failure, unspecified: Secondary | ICD-10-CM

## 2013-08-15 DIAGNOSIS — I10 Essential (primary) hypertension: Secondary | ICD-10-CM

## 2013-08-15 DIAGNOSIS — IMO0002 Reserved for concepts with insufficient information to code with codable children: Secondary | ICD-10-CM

## 2013-08-15 DIAGNOSIS — E1151 Type 2 diabetes mellitus with diabetic peripheral angiopathy without gangrene: Secondary | ICD-10-CM

## 2013-08-15 DIAGNOSIS — D638 Anemia in other chronic diseases classified elsewhere: Secondary | ICD-10-CM

## 2013-08-15 DIAGNOSIS — E1159 Type 2 diabetes mellitus with other circulatory complications: Secondary | ICD-10-CM

## 2013-08-15 NOTE — Progress Notes (Signed)
Patient ID: Justin Raisanen Sr., male   DOB: February 13, 1946, 68 y.o.   MRN: [id number]    Nursing Home Location:  [facility]   Place of Service: SNF (31)  PCP: [name], MD  Allergies  Allergen Reactions  . Ancef [Cefazolin] Itching and Rash  . Sulfa Antibiotics Itching and Rash    Chief Complaint  Patient presents with  . Medical Managment of Chronic Issues    HPI:  Patient is 68 y.o. male who  Was admitted to [facility] after L BKA after he had failed 1 month abx for osteo; pt with pmh of DM, CKD, CHF, anemia; pt being seen today for routine follow up on chronic conditions, staff without concerns and pt does not have complaints  Review of Systems:  Review of Systems  Constitutional: Negative for fever, chills and malaise/fatigue.  Respiratory: Negative for cough and shortness of breath.   Cardiovascular: Negative for chest pain and palpitations.  Gastrointestinal: Negative for abdominal pain, diarrhea and constipation.  Genitourinary: Negative for dysuria and urgency.  Musculoskeletal: Negative for myalgias.  Skin: Negative for itching and rash.  Neurological: Positive for weakness. Negative for dizziness and headaches.  Psychiatric/Behavioral: Negative for depression. The patient is not nervous/anxious.      Past Medical History  Diagnosis Date  . Diabetes mellitus   . Hypertension   . Hyperlipidemia   . CHF (congestive heart failure)   . Enlarged heart   . Pneumonia     hx of   . Osteomyelitis of foot     LT  . CKD (chronic kidney disease), stage IV    Past Surgical History  Procedure Laterality Date  . Right leg fracture    . Skin graft    . Knee surgery    . Tee without cardioversion  03/17/2012    Procedure: TRANSESOPHAGEAL ECHOCARDIOGRAM (TEE);  Surgeon: Ricki Rodriguez, MD;  Location: [facility] ENDOSCOPY;  Service: Cardiovascular;  Laterality: N/A;  . Incision and drainage Left 07/02/2013    Procedure: INCISION AND DRAINAGE LEFT FOOT;  Surgeon:  Kathryne Hitch, MD;  Location: [facility] ORS;  Service: Orthopedics;  Laterality: Left;  . Amputation Left 07/18/2013    Procedure: AMPUTATION BELOW KNEE;  Surgeon: Kathryne Hitch, MD;  Location: [facility] OR;  Service: Orthopedics;  Laterality: Left;   Social History:   reports that he quit smoking about 29 years ago. He has never used smokeless tobacco. He reports that he does not drink alcohol or use illicit drugs.  No family history on file.  Medications: Patient's Medications  New Prescriptions   No medications on file  Previous Medications   AMINO ACIDS-PROTEIN HYDROLYS (FEEDING SUPPLEMENT, PRO-STAT SUGAR FREE 64,) LIQD    Take 30 mLs by mouth daily with breakfast.   CARVEDILOL (COREG) 12.5 MG TABLET    Take 1 tablet (12.5 mg total) by mouth 2 (two) times daily with a meal.   FEEDING SUPPLEMENT, GLUCERNA SHAKE, (GLUCERNA SHAKE) LIQD    Take 237 mLs by mouth 3 (three) times daily between meals.   HYDROCODONE-ACETAMINOPHEN (NORCO/VICODIN) 5-325 MG PER TABLET    Take one tablet by mouth every four hours as needed for moderate pain   INSULIN ASPART (NOVOLOG) 100 UNIT/ML INJECTION    Inject into the skin 3 (three) times daily with meals. SSI 200-300   7 UNITS; 301-400  9 UNITS; 401-500   12  UNITS; > 500 14  UNITS AND CALL MD   CBG AC AND QHS   MENTHOL-METHYL  SALICYLATE (MUSCLE RUB) 10-15 % CREA    Apply 1 application topically as needed (neck and shoulder pain).   MULTIPLE VITAMINS-MINERALS (CENTRUM SILVER ADULT 50+ PO)    Take 0.5 tablets by mouth daily.   OMEPRAZOLE (PRILOSEC) 20 MG CAPSULE    Take 20 mg by mouth daily.   ONDANSETRON (ZOFRAN) 4 MG TABLET    Take 1 tablet (4 mg total) by mouth every 6 (six) hours as needed for nausea.   TAMSULOSIN HCL (FLOMAX) 0.4 MG CAPS    Take 1 capsule (0.4 mg total) by mouth daily.   TRIAMCINOLONE CREAM (KENALOG) 0.1 %    Apply 1 application topically 3 (three) times daily. On back and bottom  Modified Medications   No medications on file    Discontinued Medications   No medications on file     Physical Exam:  Filed Vitals:   08/15/13 2214  BP: 102/64  Pulse: 74  Temp: 97.6 F (36.4 C)  Resp: 20    Physical Exam  Constitutional: He is well-developed, well-nourished, and in no distress.  HENT:  Mouth/Throat: Oropharynx is clear and moist. No oropharyngeal exudate.  Eyes: Conjunctivae and EOM are normal. Pupils are equal, round, and reactive to light.  Neck: Normal range of motion. Neck supple. No thyromegaly present.  Cardiovascular: Normal rate, regular rhythm and normal heart sounds.   Pulmonary/Chest: Effort normal and breath sounds normal. No respiratory distress.  Abdominal: Soft. Bowel sounds are normal. He exhibits no distension.  Musculoskeletal: He exhibits no edema and no tenderness.  L BKA dsg intact  Lymphadenopathy:    He has no cervical adenopathy.  Neurological: He is alert.  Skin: Skin is warm and dry.  Psychiatric: He has a flat affect.     Labs reviewed: Basic Metabolic Panel:  Recent Labs  16/10/96 2300  07/03/13 0416  07/19/13 0416 07/20/13 0603 07/21/13 0500  NA 146*  < > 141  < > 149* 149* 147  K 3.0*  < > 3.6*  < > 4.3 4.4 4.6  CL 110  < > 104  < > 114* 115* 112  CO2 28  < > 24  < > 24 24 24   GLUCOSE 179*  < > 187*  < > 144* 170* 153*  BUN 14  < > 47*  < > 32* 28* 25*  CREATININE 1.43*  < > 2.63*  < > 2.14* 1.94* 1.86*  CALCIUM 8.2*  < > 8.0*  < > 7.9* 7.5* 7.7*  MG 2.0  --  2.1  --   --   --   --   < > = values in this interval not displayed. Liver Function Tests:  Recent Labs  06/30/13 0405 07/04/13 0358 07/15/13 1645  AST 9 13 12   ALT <5 7 <5  ALKPHOS 67 81 64  BILITOT 0.3 0.3 0.2*  PROT 7.3 6.7 7.7  ALBUMIN 2.5* 2.7* 2.4*    Recent Labs  03/06/13 1800  LIPASE 8*   No results found for this basename: AMMONIA,  in the last 8760 hours CBC:  Recent Labs  03/16/13 2112  06/29/13 1532  07/15/13 1645  07/18/13 0600  07/19/13 0416 07/21/13 0500  07/21/13 1850  WBC 10.8*  < > 15.4*  < > 9.0  < > 7.2  --  8.3 8.9  --   NEUTROABS 9.5*  --  12.7*  --  6.6  --   --   --   --   --   --  HGB 9.5*  < > 11.2*  < > 8.4*  < > 8.2*  < > 8.3* 7.9* 9.3*  HCT 29.5*  < > 33.7*  < > 25.9*  < > 25.7*  < > 25.9* 24.5* 28.6*  MCV 85.0  < > 83.8  < > 85.8  < > 83.7  --  84.1 84.2  --   PLT 229  < > 348  < > 266  < > 228  --  253 254  --   < > = values in this interval not displayed. Cardiac Enzymes:  Recent Labs  03/08/13 2210 03/09/13 0908 07/15/13 1659  TROPONINI <0.30 <0.30 <0.30   BNP: No components found with this basename: POCBNP,  CBG:  Recent Labs  07/21/13 0749 07/21/13 1140 07/21/13 1628  GLUCAP 120* 146* 162*   TSH:  Recent Labs  12/07/12 1517  TSH 2.621   A1C: Lab Results  Component Value Date   HGBA1C 8.0* 07/16/2013    Assessment/Plan 1. DM (diabetes mellitus), type 2, uncontrolled, periph vascular complic Lantus held sec to poor po intake; maintains on SSI novolog at this time; will follow up A1c - blood sugars reviewed and stable at this time  2. Hypertension -stable; on coreg only  3. Chronic diastolic CHF (congestive heart failure) -conts on coreg; remains stable without episodes or symptoms of exacerbation of fluid overload  4. Chronic renal failure, stage 4 (severe) Baseline 1.9, down to 1.86 prior to discharge from hospital; will follow up cmp at this time  5. Anemia of chronic disease received blood in hospital; will follow up CBC at this time  6. S/P BKA (below knee amputation) unilateral Stable; has completed abx

## 2013-08-28 ENCOUNTER — Non-Acute Institutional Stay (SKILLED_NURSING_FACILITY): Payer: Medicare Other | Admitting: Internal Medicine

## 2013-08-28 ENCOUNTER — Encounter: Payer: Self-pay | Admitting: Internal Medicine

## 2013-08-28 DIAGNOSIS — R600 Localized edema: Secondary | ICD-10-CM

## 2013-08-28 DIAGNOSIS — I5032 Chronic diastolic (congestive) heart failure: Secondary | ICD-10-CM

## 2013-08-28 DIAGNOSIS — R609 Edema, unspecified: Secondary | ICD-10-CM

## 2013-08-28 DIAGNOSIS — I509 Heart failure, unspecified: Secondary | ICD-10-CM

## 2013-08-29 NOTE — Progress Notes (Signed)
MRN: [id number] Name: Justin HansenDonald Helming Sr.  Sex: male Age: 68 y.o. DOB: 09/28/1945  [facility] Facility/Room: 129A Level Of Care: SNF Provider: Merrilee SeashoreALEXANDER, [name] Emergency Contacts: Extended Emergency Contact Information Primary Emergency Contact: [name] Address: [address]          [place], [place] [id number] [place] Home Phone: [phone] Relation: Spouse  Code Status: FULL  Allergies: Ancef and Sulfa antibiotics  Chief Complaint  Patient presents with  . Acute Visit    HPI: Patient is 68 y.o. male whose wife has voiced concern to nursing that her husband is retaining fluid now that he is off lasix. The wife would like me to evaluate.  Past Medical History  Diagnosis Date  . Diabetes mellitus   . Hypertension   . Hyperlipidemia   . CHF (congestive heart failure)   . Enlarged heart   . Pneumonia     hx of   . Osteomyelitis of foot     LT  . CKD (chronic kidney disease), stage IV     Past Surgical History  Procedure Laterality Date  . Right leg fracture    . Skin graft    . Knee surgery    . Tee without cardioversion  03/17/2012    Procedure: TRANSESOPHAGEAL ECHOCARDIOGRAM (TEE);  Surgeon: Justin RodriguezAjay S Kadakia, MD;  [facility];  Service: Cardiovascular;  Laterality: N/A;  . Incision and drainage Left 07/02/2013    Procedure: INCISION AND DRAINAGE LEFT FOOT;  Surgeon: Justin Hitchhristopher Y Blackman, MD;  Location: [facility] ORS;  Service: Orthopedics;  Laterality: Left;  . Amputation Left 07/18/2013    Procedure: AMPUTATION BELOW KNEE;  Surgeon: Justin Hitchhristopher Y Blackman, MD;  [facility];  Service: Orthopedics;  Laterality: Left;      Medication List       This list is accurate as of: 08/28/13 11:59 PM.  Always use your most recent med list.               carvedilol 12.5 MG tablet  Commonly known as:  COREG  Take 1 tablet (12.5 mg total) by mouth 2 (two) times daily with a meal.     CENTRUM SILVER ADULT 50+ PO  Take 0.5  tablets by mouth daily.     feeding supplement (GLUCERNA SHAKE) Liqd  Take 237 mLs by mouth 3 (three) times daily between meals.     feeding supplement (PRO-STAT SUGAR FREE 64) Liqd  Take 30 mLs by mouth daily with breakfast.     HYDROcodone-acetaminophen 5-325 MG per tablet  Commonly known as:  NORCO/VICODIN  Take one tablet by mouth every four hours as needed for moderate pain     insulin aspart 100 UNIT/ML injection  Commonly known as:  novoLOG  Inject into the skin 3 (three) times daily with meals. SSI 200-300   7 UNITS; 301-400  9 UNITS; 401-500   12  UNITS; > 500 14  UNITS AND CALL MD   CBG AC AND QHS     MUSCLE RUB 10-15 % Crea  Apply 1 application topically as needed (neck and shoulder pain).     omeprazole 20 MG capsule  Commonly known as:  PRILOSEC  Take 20 mg by mouth daily.     ondansetron 4 MG tablet  Commonly known as:  ZOFRAN  Take 1 tablet (4 mg total) by mouth every 6 (six) hours as needed for nausea.     tamsulosin 0.4 MG Caps capsule  Commonly known as:  FLOMAX  Take 1 capsule (0.4 mg total) by mouth daily.     triamcinolone cream 0.1 %  Commonly known as:  KENALOG  Apply 1 application topically 3 (three) times daily. On back and bottom        No orders of the defined types were placed in this encounter.    Immunization History  Administered Date(s) Administered  . Influenza Split 02/21/2012, 02/15/2013  . Pneumococcal Polysaccharide-23 02/21/2012    History  Substance Use Topics  . Smoking status: Former Smoker    Quit date: 03/10/1984  . Smokeless tobacco: Never Used  . Alcohol Use: No    Review of Systems  DATA OBTAINED: from patient; pt says he does not know why his wife has concern GENERAL: no fevers, fatigue, appetite changes SKIN: No itching, rash HEENT: No complaint RESPIRATORY: No cough, wheezing, SOB CARDIAC: No chest pain, palpitations, + lower extremity edema  GI: No abdominal pain, No N/V/[name] or constipation, No heartburn or  reflux  GU: No dysuria, frequency or urgency, or incontinence  MUSCULOSKELETAL: No unrelieved bone/joint pain NEUROLOGIC: No headache, dizziness or focal weakness PSYCHIATRIC: No overt anxiety or sadness. Sleeps well.   Filed Vitals:   08/28/13 2358  BP: 110/81  Pulse: 81  Temp: 98.9 F (37.2 C)  Resp: 22    Physical Exam  GENERAL APPEARANCE: Alert, conversant. Appropriately groomed. No acute distress  SKIN: No diaphoresis rash HEENT: Unremarkable RESPIRATORY: Breathing is even, unlabored. Lung sounds are clear   CARDIOVASCULAR: Heart RRR no murmurs, rubs or gallops. No peripheral edema  GASTROINTESTINAL: Abdomen is soft, non-tender, not distended w/ normal bowel sounds.  GENITOURINARY: Bladder non tender, not distended  MUSCULOSKELETAL: No abnormal joints or musculature NEUROLOGIC: Cranial nerves 2-12 grossly intact. Moves all extremities no tremor. PSYCHIATRIC: Mood and affect appropriate to situation, no behavioral issues  Patient Active Problem List   Diagnosis Date Noted  . S/P BKA (below knee amputation) unilateral 07/24/2013  . Anemia following surgery 07/24/2013  . Acute encephalopathy 07/15/2013  . Hypotension 07/15/2013  . UTI (urinary tract infection) 07/15/2013  . DM (diabetes mellitus), type 2, uncontrolled, periph vascular complic 07/05/2013  . Chronic renal failure, stage 4 (severe) 07/05/2013  . Anemia of chronic disease 07/05/2013  . Open wound of foot except toes with complication 06/29/2013  . Cellulitis and abscess of foot 06/29/2013  . Hypertension 06/29/2013  . Pulmonary infiltrate 03/22/2013  . Atelectasis 03/22/2013  . Acute on chronic renal failure 03/08/2013  . Hypernatremia 03/08/2013  . Anasarca 03/08/2013  . Physical deconditioning 03/08/2013  . Unable to ambulate 03/08/2013  . Diabetic peripheral neuropathy 03/08/2013  . Bilateral leg edema, L>R 03/08/2013  . Shock 03/08/2013  . History of Bacteremia due to Staphylococcus 03/08/2013  .  Tremor 03/08/2013  . Hypokalemia 03/08/2013  . Altered mental status 03/08/2013  . Slurred speech 11/26/2012  . Chronic diastolic CHF (congestive heart failure) 08/04/2011  . Chronic hypertension 08/04/2011  . Pleural effusion, bilateral 08/04/2011  . DIABETES MELLITUS, WITH VASCULAR COMPLICATIONS 05/26/2010    CBC    Component Value Date/Time   WBC 8.9 07/21/2013 0500   RBC 2.91* 07/21/2013 0500   HGB 9.3* 07/21/2013 1850   HCT 28.6* 07/21/2013 1850   PLT 254 07/21/2013 0500   MCV 84.2 07/21/2013 0500   LYMPHSABS 0.6* 07/15/2013 1645   MONOABS 1.0 07/15/2013 1645   EOSABS 0.9* 07/15/2013 1645   BASOSABS 0.0 07/15/2013 1645    CMP     Component Value Date/Time   NA 147 07/21/2013  0500   K 4.6 07/21/2013 0500   CL 112 07/21/2013 0500   CO2 24 07/21/2013 0500   GLUCOSE 153* 07/21/2013 0500   BUN 25* 07/21/2013 0500   CREATININE 1.86* 07/21/2013 0500   CALCIUM 7.7* 07/21/2013 0500   PROT 7.7 07/15/2013 1645   ALBUMIN 2.4* 07/15/2013 1645   AST 12 07/15/2013 1645   ALT <5 07/15/2013 1645   ALKPHOS 64 07/15/2013 1645   BILITOT 0.2* 07/15/2013 1645   GFRNONAA 36* 07/21/2013 0500   GFRAA 41* 07/21/2013 0500    Assessment and Plan  Chronic diastolic CHF (congestive heart failure) Per nursing wife has concern that her husband is retaining fluid since he is now off Lasix. Pt does have pitting edema of RLE (s/p L BKA) which he did not have prior. Lasix 40 mg daily for 5 days. No supplemental K as recent K was 4.9. Pt with chronic renal failure, stage 4    Karesha Trzcinski, Randon Goldsmith, MD

## 2013-08-29 NOTE — Assessment & Plan Note (Addendum)
Per nursing wife has concern that her husband is retaining fluid since he is now off Lasix. Pt does have pitting edema of RLE (s/p L BKA) which he did not have prior. Lasix 40 mg daily for 5 days. No supplemental K as recent K was 4.9. Pt with chronic renal failure, stage 4

## 2013-09-05 ENCOUNTER — Emergency Department (HOSPITAL_COMMUNITY): Payer: Medicare Other

## 2013-09-05 ENCOUNTER — Encounter (HOSPITAL_COMMUNITY): Payer: Self-pay | Admitting: Emergency Medicine

## 2013-09-05 ENCOUNTER — Inpatient Hospital Stay (HOSPITAL_COMMUNITY)
Admission: EM | Admit: 2013-09-05 | Discharge: 2013-09-11 | DRG: 193 | Disposition: A | Discharge status: Skilled Nursing Facility | Payer: Medicare Other | Attending: Internal Medicine | Admitting: Internal Medicine

## 2013-09-05 DIAGNOSIS — A419 Sepsis, unspecified organism: Secondary | ICD-10-CM

## 2013-09-05 DIAGNOSIS — I10 Essential (primary) hypertension: Secondary | ICD-10-CM

## 2013-09-05 DIAGNOSIS — R601 Generalized edema: Secondary | ICD-10-CM

## 2013-09-05 DIAGNOSIS — E1151 Type 2 diabetes mellitus with diabetic peripheral angiopathy without gangrene: Secondary | ICD-10-CM

## 2013-09-05 DIAGNOSIS — Z882 Allergy status to sulfonamides status: Secondary | ICD-10-CM

## 2013-09-05 DIAGNOSIS — J189 Pneumonia, unspecified organism: Secondary | ICD-10-CM

## 2013-09-05 DIAGNOSIS — I2699 Other pulmonary embolism without acute cor pulmonale: Secondary | ICD-10-CM

## 2013-09-05 DIAGNOSIS — Z6832 Body mass index (BMI) 32.0-32.9, adult: Secondary | ICD-10-CM

## 2013-09-05 DIAGNOSIS — N179 Acute kidney failure, unspecified: Secondary | ICD-10-CM

## 2013-09-05 DIAGNOSIS — N189 Chronic kidney disease, unspecified: Secondary | ICD-10-CM

## 2013-09-05 DIAGNOSIS — E669 Obesity, unspecified: Secondary | ICD-10-CM | POA: Diagnosis present

## 2013-09-05 DIAGNOSIS — N184 Chronic kidney disease, stage 4 (severe): Secondary | ICD-10-CM | POA: Diagnosis present

## 2013-09-05 DIAGNOSIS — S88119A Complete traumatic amputation at level between knee and ankle, unspecified lower leg, initial encounter: Secondary | ICD-10-CM

## 2013-09-05 DIAGNOSIS — T50995A Adverse effect of other drugs, medicaments and biological substances, initial encounter: Secondary | ICD-10-CM | POA: Diagnosis present

## 2013-09-05 DIAGNOSIS — G929 Unspecified toxic encephalopathy: Secondary | ICD-10-CM | POA: Diagnosis present

## 2013-09-05 DIAGNOSIS — E1142 Type 2 diabetes mellitus with diabetic polyneuropathy: Secondary | ICD-10-CM

## 2013-09-05 DIAGNOSIS — Z888 Allergy status to other drugs, medicaments and biological substances status: Secondary | ICD-10-CM

## 2013-09-05 DIAGNOSIS — Z89519 Acquired absence of unspecified leg below knee: Secondary | ICD-10-CM

## 2013-09-05 DIAGNOSIS — E1159 Type 2 diabetes mellitus with other circulatory complications: Secondary | ICD-10-CM

## 2013-09-05 DIAGNOSIS — R0902 Hypoxemia: Secondary | ICD-10-CM | POA: Diagnosis present

## 2013-09-05 DIAGNOSIS — D638 Anemia in other chronic diseases classified elsewhere: Secondary | ICD-10-CM

## 2013-09-05 DIAGNOSIS — R000 Tachycardia, unspecified: Secondary | ICD-10-CM | POA: Diagnosis present

## 2013-09-05 DIAGNOSIS — IMO0002 Reserved for concepts with insufficient information to code with codable children: Secondary | ICD-10-CM

## 2013-09-05 DIAGNOSIS — Z794 Long term (current) use of insulin: Secondary | ICD-10-CM

## 2013-09-05 DIAGNOSIS — E1165 Type 2 diabetes mellitus with hyperglycemia: Secondary | ICD-10-CM

## 2013-09-05 DIAGNOSIS — E785 Hyperlipidemia, unspecified: Secondary | ICD-10-CM | POA: Diagnosis present

## 2013-09-05 DIAGNOSIS — G92 Toxic encephalopathy: Secondary | ICD-10-CM | POA: Diagnosis present

## 2013-09-05 DIAGNOSIS — Z6833 Body mass index (BMI) 33.0-33.9, adult: Secondary | ICD-10-CM

## 2013-09-05 DIAGNOSIS — I5032 Chronic diastolic (congestive) heart failure: Secondary | ICD-10-CM | POA: Diagnosis present

## 2013-09-05 DIAGNOSIS — Z87891 Personal history of nicotine dependence: Secondary | ICD-10-CM

## 2013-09-05 DIAGNOSIS — E1149 Type 2 diabetes mellitus with other diabetic neurological complication: Secondary | ICD-10-CM | POA: Diagnosis present

## 2013-09-05 DIAGNOSIS — I509 Heart failure, unspecified: Secondary | ICD-10-CM | POA: Diagnosis present

## 2013-09-05 DIAGNOSIS — I129 Hypertensive chronic kidney disease with stage 1 through stage 4 chronic kidney disease, or unspecified chronic kidney disease: Secondary | ICD-10-CM | POA: Diagnosis present

## 2013-09-05 DIAGNOSIS — G9340 Encephalopathy, unspecified: Secondary | ICD-10-CM

## 2013-09-05 DIAGNOSIS — R4182 Altered mental status, unspecified: Secondary | ICD-10-CM

## 2013-09-05 DIAGNOSIS — A4902 Methicillin resistant Staphylococcus aureus infection, unspecified site: Secondary | ICD-10-CM | POA: Diagnosis present

## 2013-09-05 LAB — CBC WITH DIFFERENTIAL/PLATELET
BASOS ABS: 0 10*3/uL (ref 0.0–0.1)
BASOS PCT: 0 % (ref 0–1)
EOS ABS: 0.4 10*3/uL (ref 0.0–0.7)
Eosinophils Relative: 6 % — ABNORMAL HIGH (ref 0–5)
HCT: 32.6 % — ABNORMAL LOW (ref 39.0–52.0)
HEMOGLOBIN: 10.8 g/dL — AB (ref 13.0–17.0)
Lymphocytes Relative: 8 % — ABNORMAL LOW (ref 12–46)
Lymphs Abs: 0.5 10*3/uL — ABNORMAL LOW (ref 0.7–4.0)
MCH: 27.6 pg (ref 26.0–34.0)
MCHC: 33.1 g/dL (ref 30.0–36.0)
MCV: 83.4 fL (ref 78.0–100.0)
MONOS PCT: 7 % (ref 3–12)
Monocytes Absolute: 0.5 10*3/uL (ref 0.1–1.0)
NEUTROS ABS: 5.7 10*3/uL (ref 1.7–7.7)
NEUTROS PCT: 79 % — AB (ref 43–77)
Platelets: 196 10*3/uL (ref 150–400)
RBC: 3.91 MIL/uL — ABNORMAL LOW (ref 4.22–5.81)
RDW: 14.6 % (ref 11.5–15.5)
WBC: 7.1 10*3/uL (ref 4.0–10.5)

## 2013-09-05 LAB — COMPREHENSIVE METABOLIC PANEL
ALBUMIN: 2.5 g/dL — AB (ref 3.5–5.2)
ALK PHOS: 68 U/L (ref 39–117)
AST: 29 U/L (ref 0–37)
BILIRUBIN TOTAL: 0.2 mg/dL — AB (ref 0.3–1.2)
BUN: 20 mg/dL (ref 6–23)
CHLORIDE: 108 meq/L (ref 96–112)
CO2: 22 mEq/L (ref 19–32)
Calcium: 8.6 mg/dL (ref 8.4–10.5)
Creatinine, Ser: 1.38 mg/dL — ABNORMAL HIGH (ref 0.50–1.35)
GFR calc Af Amer: 59 mL/min — ABNORMAL LOW (ref >90)
GFR calc non Af Amer: 51 mL/min — ABNORMAL LOW (ref >90)
Glucose, Bld: 157 mg/dL — ABNORMAL HIGH (ref 70–99)
POTASSIUM: 5.2 meq/L (ref 3.7–5.3)
SODIUM: 143 meq/L (ref 137–147)
Total Protein: 6.5 g/dL (ref 6.0–8.3)

## 2013-09-05 LAB — URINE MICROSCOPIC-ADD ON

## 2013-09-05 LAB — URINALYSIS, ROUTINE W REFLEX MICROSCOPIC
Bilirubin Urine: NEGATIVE
GLUCOSE, UA: NEGATIVE mg/dL
Ketones, ur: NEGATIVE mg/dL
Nitrite: NEGATIVE
Protein, ur: >300 mg/dL — AB
SPECIFIC GRAVITY, URINE: 1.019 (ref 1.005–1.030)
UROBILINOGEN UA: 0.2 mg/dL (ref 0.0–1.0)
pH: 5 (ref 5.0–8.0)

## 2013-09-05 LAB — PRO B NATRIURETIC PEPTIDE: Pro B Natriuretic peptide (BNP): 10229 pg/mL — ABNORMAL HIGH (ref 0–125)

## 2013-09-05 LAB — PROTIME-INR
INR: 1.08 (ref 0.00–1.49)
PROTHROMBIN TIME: 13.8 s (ref 11.6–15.2)

## 2013-09-05 LAB — I-STAT CG4 LACTIC ACID, ED: Lactic Acid, Venous: 1.08 mmol/L (ref 0.5–2.2)

## 2013-09-05 MED ORDER — ACETAMINOPHEN 650 MG RE SUPP
650.0000 mg | Freq: Once | RECTAL | Status: AC
  Administered 2013-09-05: 650 mg via RECTAL
  Filled 2013-09-05: qty 1

## 2013-09-05 MED ORDER — AZTREONAM 2 G IJ SOLR
2.0000 g | Freq: Three times a day (TID) | INTRAMUSCULAR | Status: DC
  Administered 2013-09-05: 2 g via INTRAVENOUS
  Filled 2013-09-05 (×3): qty 2

## 2013-09-05 MED ORDER — DEXTROSE 5 % IV SOLN
2.0000 g | Freq: Once | INTRAVENOUS | Status: AC
  Administered 2013-09-05: 2 g via INTRAVENOUS
  Filled 2013-09-05: qty 2

## 2013-09-05 MED ORDER — VANCOMYCIN HCL IN DEXTROSE 1-5 GM/200ML-% IV SOLN: 1000.0000 mg | Freq: Once | INTRAVENOUS | Status: DC

## 2013-09-05 MED ORDER — SODIUM CHLORIDE 0.9 % IV BOLUS (SEPSIS): 500.0000 mL | Freq: Once | INTRAVENOUS | Status: DC

## 2013-09-05 MED ORDER — VANCOMYCIN HCL IN DEXTROSE 1-5 GM/200ML-% IV SOLN
1000.0000 mg | Freq: Two times a day (BID) | INTRAVENOUS | Status: DC
  Administered 2013-09-06: 1000 mg via INTRAVENOUS
  Filled 2013-09-05 (×2): qty 200

## 2013-09-05 MED ORDER — ACETAMINOPHEN 325 MG PO TABS
650.0000 mg | ORAL_TABLET | Freq: Four times a day (QID) | ORAL | Status: DC | PRN
  Administered 2013-09-05: 650 mg via ORAL
  Filled 2013-09-05: qty 2

## 2013-09-05 MED ORDER — SODIUM CHLORIDE 0.9 % IV SOLN
2000.0000 mg | Freq: Once | INTRAVENOUS | Status: AC
  Administered 2013-09-05: 2000 mg via INTRAVENOUS
  Filled 2013-09-05: qty 2000

## 2013-09-05 MED ORDER — ONDANSETRON HCL 4 MG/2ML IJ SOLN
4.0000 mg | Freq: Once | INTRAMUSCULAR | Status: AC
  Administered 2013-09-05: 4 mg via INTRAVENOUS
  Filled 2013-09-05: qty 2

## 2013-09-05 NOTE — ED Notes (Signed)
Bed assignment changed to Step Down Unit.

## 2013-09-05 NOTE — ED Notes (Signed)
Pt resting quietly at this time.  No complaints voiced.  Pt remains alert and oriented x's 3.

## 2013-09-05 NOTE — Progress Notes (Signed)
ANTIBIOTIC CONSULT NOTE - INITIAL  Pharmacy Consult for vancomycin and aztreonam Indication: HCAP  Allergies  Allergen Reactions  . Ancef [Cefazolin] Itching and Rash  . Sulfa Antibiotics Itching and Rash    Patient Measurements: Height: 5\' 10"  (177.8 cm) Weight: 226 lb (102.513 kg) IBW/kg (Calculated) : 73  Vital Signs: Temp: 102.1 F (38.9 C) (03/31 1441) Temp src: Rectal (03/31 1441) BP: 161/85 mmHg (03/31 1441) Pulse Rate: 98 (03/31 1441)  Labs: No results found for this basename: WBC, HGB, PLT, LABCREA, CREATININE,  in the last 72 hours Estimated Creatinine Clearance: 45.6 ml/min (by C-G formula based on Cr of 1.86). No results found for this basename: VANCOTROUGH, VANCOPEAK, VANCORANDOM, GENTTROUGH, GENTPEAK, GENTRANDOM, TOBRATROUGH, TOBRAPEAK, TOBRARND, AMIKACINPEAK, AMIKACINTROU, AMIKACIN,  in the last 72 hours   Microbiology: No results found for this or any previous visit (from the past 720 hour(s)).  Medical History: Past Medical History  Diagnosis Date  . Diabetes mellitus   . Hypertension   . Hyperlipidemia   . CHF (congestive heart failure)   . Enlarged heart   . Pneumonia     hx of   . Osteomyelitis of foot     LT  . CKD (chronic kidney disease), stage IV     Medications:  Infusions:  . aztreonam    . vancomycin     Assessment: 68 yo M [facility] ED [facility] with fever and cough since 3/28.  Pharmacy consulted to start vancomycin and aztreonam for HCAP in a sulfa/cephalosporin-allergic patient.  Initial labs reveal WBC wnl and SCr 1.38 with estimated CrCl ~61. Patient with current fever of 102.1.  Goal of Therapy:  Vancomycin trough level 15-20 mcg/ml Resolution of infection   Plan:  - give vancomycin IV 2gm loading dose in ED, followed by 1000mg  q12h - [facility] - s/p aztreonam IV 2g in ED, continue with 2g q8h - monitor kidney function, WBC, temperature curve, any cultures, and clinical  progression  Harrold DonathNathan E. Achilles Dunkope, PharmD Clinical Pharmacist - Resident Pager: [phone] Pharmacy: [phone] 09/05/2013 3:57 PM

## 2013-09-05 NOTE — ED Notes (Signed)
[facility] with cough since Saturday. Today with fever to 102.5. Alert, oriented x4 at present.

## 2013-09-05 NOTE — ED Notes (Signed)
Dr. Criss AlvineGoldston notified of CG-4 result

## 2013-09-05 NOTE — ED Provider Notes (Signed)
CSN: [id number]     Arrival date & time 09/05/13  1431 History   First MD Initiated Contact with Patient 09/05/13 1500     Chief Complaint  Patient presents with  . Code Sepsis     (Consider location/radiation/quality/duration/timing/severity/associated sxs/prior Treatment) HPI 68 year old male presents from [facility] with fever and chills. The wife states the patient has been having decreased alertness and maybe some mild confusion in addition to productive cough. Today started having chills and fever. This is very similar to when he had pneumonia one year ago. The patient had trouble doing so his rehabilitation today as well. The patient does have is confused cannot give a good history. The patient had a BKA a couple months ago, which is why is at the rehabilitation center. The wound has been healing well after the second revision. No drainage from this wound.  Past Medical History  Diagnosis Date  . Diabetes mellitus   . Hypertension   . Hyperlipidemia   . CHF (congestive heart failure)   . Enlarged heart   . Pneumonia     hx of   . Osteomyelitis of foot     LT  . CKD (chronic kidney disease), stage IV    Past Surgical History  Procedure Laterality Date  . Right leg fracture    . Skin graft    . Knee surgery    . Tee without cardioversion  03/17/2012    Procedure: TRANSESOPHAGEAL ECHOCARDIOGRAM (TEE);  Surgeon: Ricki Rodriguez, MD;  Location: [facility] ENDOSCOPY;  Service: Cardiovascular;  Laterality: N/A;  . Incision and drainage Left 07/02/2013    Procedure: INCISION AND DRAINAGE LEFT FOOT;  Surgeon: Kathryne Hitch, MD;  Location: [facility] ORS;  Service: Orthopedics;  Laterality: Left;  . Amputation Left 07/18/2013    Procedure: AMPUTATION BELOW KNEE;  Surgeon: Kathryne Hitch, MD;  Location: [facility] OR;  Service: Orthopedics;  Laterality: Left;   History reviewed. No pertinent family history. History  Substance Use Topics  . Smoking status: Former Smoker     Quit date: 03/10/1984  . Smokeless tobacco: Never Used  . Alcohol Use: No    Review of Systems  Constitutional: Positive for fever and chills.  Respiratory: Positive for cough.   Cardiovascular: Negative for chest pain.  Gastrointestinal: Negative for vomiting and abdominal pain.  Skin: Negative for wound.  Psychiatric/Behavioral: Positive for confusion.  All other systems reviewed and are negative.      Allergies  Ancef and Sulfa antibiotics  Home Medications   Current Outpatient Rx  Name  Route  Sig  Dispense  Refill  . Amino Acids-Protein Hydrolys (FEEDING SUPPLEMENT, PRO-STAT SUGAR FREE 64,) LIQD   Oral   Take 30 mLs by mouth daily with breakfast.         . carvedilol (COREG) 12.5 MG tablet   Oral   Take 1 tablet (12.5 mg total) by mouth 2 (two) times daily with a meal.   60 tablet   3   . feeding supplement, GLUCERNA SHAKE, (GLUCERNA SHAKE) LIQD   Oral   Take 237 mLs by mouth 3 (three) times daily between meals.      0   . HYDROcodone-acetaminophen (NORCO/VICODIN) 5-325 MG per tablet      Take one tablet by mouth every four hours as needed for moderate pain   180 tablet   0   . insulin aspart (NOVOLOG) 100 UNIT/ML injection   Subcutaneous   Inject into the skin 3 (three) times daily  with meals. SSI 200-300   7 UNITS; 301-400  9 UNITS; 401-500   12  UNITS; > 500 14  UNITS AND CALL MD   CBG AC AND QHS         . Menthol-Methyl Salicylate (MUSCLE RUB) 10-15 % CREA   Topical   Apply 1 application topically as needed (neck and shoulder pain).         . Multiple Vitamins-Minerals (CENTRUM SILVER ADULT 50+ PO)   Oral   Take 0.5 tablets by mouth daily.         Marland Kitchen. omeprazole (PRILOSEC) 20 MG capsule   Oral   Take 20 mg by mouth daily.         . ondansetron (ZOFRAN) 4 MG tablet   Oral   Take 1 tablet (4 mg total) by mouth every 6 (six) hours as needed for nausea.   20 tablet   0   . Tamsulosin HCl (FLOMAX) 0.4 MG CAPS   Oral   Take 1  capsule (0.4 mg total) by mouth daily.   30 capsule   3   . triamcinolone cream (KENALOG) 0.1 %   Topical   Apply 1 application topically 3 (three) times daily. On back and bottom          BP 161/85  Pulse 98  Temp(Src) 102.1 F (38.9 C) (Rectal)  Resp 34  SpO2 93% Physical Exam  Nursing note and vitals reviewed. Constitutional: He is oriented to person, place, and time. He appears well-developed and well-nourished.  HENT:  Head: Normocephalic and atraumatic.  Right Ear: External ear normal.  Left Ear: External ear normal.  Nose: Nose normal.  Eyes: Right eye exhibits no discharge. Left eye exhibits no discharge.  Neck: Neck supple.  Cardiovascular: Normal rate, regular rhythm, normal heart sounds and intact distal pulses.   Pulmonary/Chest: Tachypnea noted. He has rales.  Abdominal: Soft. He exhibits no distension. There is no tenderness.  Musculoskeletal: He exhibits no edema.  LLE BKA with good healing, no open wounds, drainage or cellulitis  Neurological: He is alert and oriented to person, place, and time.  Skin: Skin is warm and dry.    ED Course  Procedures (including critical care time) Labs Review Labs Reviewed  COMPREHENSIVE METABOLIC PANEL - Abnormal; Notable for the following:    Glucose, Bld 157 (*)    Creatinine, Ser 1.38 (*)    Albumin 2.5 (*)    Total Bilirubin 0.2 (*)    GFR calc non Af Amer 51 (*)    GFR calc Af Amer 59 (*)    All other components within normal limits  PRO B NATRIURETIC PEPTIDE - Abnormal; Notable for the following:    Pro B Natriuretic peptide (BNP) 10229.0 (*)    All other components within normal limits  CBC WITH DIFFERENTIAL - Abnormal; Notable for the following:    RBC 3.91 (*)    Hemoglobin 10.8 (*)    HCT 32.6 (*)    Neutrophils Relative % 79 (*)    Lymphocytes Relative 8 (*)    Lymphs Abs 0.5 (*)    Eosinophils Relative 6 (*)    All other components within normal limits  CULTURE, BLOOD (ROUTINE X 2)  CULTURE,  BLOOD (ROUTINE X 2)  URINE CULTURE  PROTIME-INR  CBC WITH DIFFERENTIAL  URINALYSIS, ROUTINE W REFLEX MICROSCOPIC  I-STAT CG4 LACTIC ACID, ED   Imaging Review Dg Chest Port 1 View  (if Code Sepsis Called)  09/05/2013   CLINICAL DATA:  Cough, weakness, confusion  EXAM: PORTABLE CHEST - 1 VIEW  COMPARISON:  DG CHEST 1V PORT dated 07/15/2013  FINDINGS: Low lung volumes. Cardiac silhouette is enlarged. . Both lungs are clear. The visualized skeletal structures are unremarkable.  IMPRESSION: No active disease.   Electronically Signed   By: Salome Holmes M.D.   On: 09/05/2013 15:25     EKG Interpretation   Date/Time:  Tuesday September 05 2013 14:41:41 EDT Ventricular Rate:  99 PR Interval:  139 QRS Duration: 98 QT Interval:  381 QTC Calculation: 489 R Axis:   37 Text Interpretation:  Sinus rhythm Repol abnrm suggests ischemia, lateral  leads these changes are not new from Feb 2015 Confirmed by Itzayanna Kaster  MD,  Amor Hyle ([id number]) on 09/05/2013 3:02:30 PM      MDM   Final diagnoses:  HCAP (healthcare-associated pneumonia)  Acute encephalopathy  Sepsis    Patient initially tachycardic and tachypneic, although no accessory muscle use or significant hypoxia. Clinically he appears to have pneumonia his chest x-ray is clear. His fever may also be coming from a UTI. He was given antibiotics after blood cultures. Given that he is somewhat confused he will need admission. Of note while the patient was waiting for his bed he had acute episode of vomiting and appeared to possibly aspirate. He became hypoxic to the low 80s and significantly tachycardic. After supportive care with oxygen and suctioning he seemed to improve. His oxygen saturations improved and his heart rate trended down to the low 100s. I've ordered a CT scan to rule out PE as well given his recent surgeries after discussion with the admitting hospitalist Vanessa Barbara). He is aware of the pending CT scan and we'll change his disposition to step  down.    Audree Camel, MD 09/05/13 [phone]

## 2013-09-05 NOTE — H&P (Signed)
[facility] History and Physical  Justin Klahn Sr. RUE:[id number] DOB: [birth year] DOA: 09/05/2013  Referring physician:  PCP: [name], MD   Chief Complaint: Cough/Fever/Chills  HPI: Justin Sliter Sr. is a 68 y.o. male with a past medical history of left below-the-knee amputation, poorly controlled type 2 diabetes mellitus, hypertension, currently resident at [facility], who was referred to the emergency department this afternoon with complaints of fevers and chills. Patient having a one-week history of cough, shortness of breath, with associated sputum characterized as yellow/green, associated with an overall functional decline. Patient with a past week has become increasingly lethargic, unable to process may with physical therapy, having minimal by mouth intake and increased confusion.  Chest x-ray performed on admission showing no active disease. CBC showed a white count within normal limits at  7100. Given clinical suspicion for healthcare social pneumonia patient was started on broad-spectrum IV antibiotic therapy with vancomycin and aztreonam in the emergency room. He presently denies shortness of breath, chest pain, abdominal pain, syncope, presyncope, or blood per rectum, melena, dysuria, hematuria, swelling or erythema from left BKA.                                                                                                                                                  Review of Systems:  Constitutional:  No weight loss, positive for night sweats, Fevers, chills, fatigue.  HEENT:  Positive for headaches, denies Difficulty swallowing,Tooth/dental problems,Sore throat,  No sneezing, itching, ear ache, nasal congestion, post nasal drip,  Cardio-vascular:  No chest pain, Orthopnea, PND, swelling in lower extremities, anasarca, dizziness, palpitations  GI:  No heartburn, indigestion, abdominal pain, nausea, vomiting, diarrhea, change in bowel habits,  loss of appetite  Resp:  Positive for shortness of breath with exertion or at rest. No excess mucus, no productive cough, No non-productive cough, No coughing up of blood.No change in color of mucus.No wheezing.No chest wall deformity  Skin:  no rash or lesions.  GU:  no dysuria, change in color of urine, no urgency or frequency. No flank pain.  Musculoskeletal:  No joint pain or swelling. No decreased range of motion. No back pain.  Psych:  No change in mood or affect. No depression or anxiety. No memory loss.   Past Medical History  Diagnosis Date  . Diabetes mellitus   . Hypertension   . Hyperlipidemia   . CHF (congestive heart failure)   . Enlarged heart   . Pneumonia     hx of   . Osteomyelitis of foot     LT  . CKD (chronic kidney disease), stage IV    Past Surgical History  Procedure Laterality Date  . Right leg fracture    . Skin graft    . Knee surgery    . Tee without cardioversion  03/17/2012    Procedure: TRANSESOPHAGEAL  ECHOCARDIOGRAM (TEE);  Surgeon: Ricki Rodriguez, MD;  Location: [facility] ENDOSCOPY;  Service: Cardiovascular;  Laterality: N/A;  . Incision and drainage Left 07/02/2013    Procedure: INCISION AND DRAINAGE LEFT FOOT;  Surgeon: Kathryne Hitch, MD;  Location: [facility] ORS;  Service: Orthopedics;  Laterality: Left;  . Amputation Left 07/18/2013    Procedure: AMPUTATION BELOW KNEE;  Surgeon: Kathryne Hitch, MD;  Location: [facility] OR;  Service: Orthopedics;  Laterality: Left;   Social History:  reports that he quit smoking about 29 years ago. He has never used smokeless tobacco. He reports that he does not drink alcohol or use illicit drugs.  Allergies  Allergen Reactions  . Ancef [Cefazolin] Itching and Rash  . Sulfa Antibiotics Itching and Rash    History reviewed. No pertinent family history.   Prior to Admission medications   Medication Sig Start Date End Date Taking? Authorizing Provider  Amino Acids-Protein Hydrolys (FEEDING SUPPLEMENT,  PRO-STAT SUGAR FREE 64,) LIQD Take 30 mLs by mouth daily.    Yes Historical Provider, MD  carvedilol (COREG) 12.5 MG tablet Take 1 tablet (12.5 mg total) by mouth 2 (two) times daily with a meal. 03/14/13  Yes Robynn Pane, MD  cloNIDine (CATAPRES) 0.1 MG tablet Take 0.1 mg by mouth every 3 (three) days.   Yes Historical Provider, MD  diphenhydrAMINE (BENADRYL) 50 MG tablet Take 50 mg by mouth at bedtime as needed for itching.   Yes Historical Provider, MD  HYDROcodone-acetaminophen (NORCO/VICODIN) 5-325 MG per tablet Take one tablet by mouth every four hours as needed for moderate pain 07/21/13  Yes Man X Mast, NP  insulin aspart (NOVOLOG) 100 UNIT/ML injection Inject into the skin 3 (three) times daily with meals. SSI 200-300   7 UNITS; 301-400  9 UNITS; 401-500   12  UNITS; > 500 14  UNITS AND CALL MD   CBG AC AND QHS 07/04/13  Yes Alison Murray, MD  Menthol-Methyl Salicylate (MUSCLE RUB) 10-15 % CREA Apply 1 application topically as needed (neck and shoulder pain).   Yes Historical Provider, MD  Multiple Vitamins-Minerals (CENTRUM SILVER ADULT 50+) TABS Take 0.5 tablets by mouth daily.   Yes Historical Provider, MD  omeprazole (PRILOSEC) 20 MG capsule Take 20 mg by mouth daily.   Yes Historical Provider, MD  ondansetron (ZOFRAN) 4 MG tablet Take 1 tablet (4 mg total) by mouth every 6 (six) hours as needed for nausea. 07/04/13  Yes Alison Murray, MD  Ostomy Supplies [facility] BARRIER SKIN) OINT Apply 1 application topically 2 (two) times daily. For sacrum   Yes Historical Provider, MD  Tamsulosin HCl (FLOMAX) 0.4 MG CAPS Take 1 capsule (0.4 mg total) by mouth daily. 04/01/12  Yes Robynn Pane, MD  triamcinolone cream (KENALOG) 0.1 % Apply 1 application topically 3 (three) times daily. On back and bottom   Yes Historical Provider, MD  amLODipine (NORVASC) 5 MG tablet Take 5 mg by mouth daily.    Historical Provider, MD   Physical Exam: Filed Vitals:   09/05/13 1710  BP:   Pulse:   Temp: 99.7  F (37.6 C)  Resp:     BP 129/65  Pulse 85  Temp(Src) 99.7 F (37.6 C) (Oral)  Resp 28  Ht 5\' 10"  (1.778 m)  Wt 102.513 kg (226 lb)  BMI 32.43 kg/m2  SpO2 92%  General:  Appears calm and comfortable, appears mildly confused, ill-appearing Eyes: PERRL, normal lids, irises & conjunctiva ENT: grossly normal hearing, dry oral mucosa  Neck: no LAD, masses or thyromegaly Cardiovascular: RRR, no m/r/g. No LE edema. Telemetry: SR, no arrhythmias  Respiratory: Patient has a few crackles to bases, positive rhonchi, normal respiratory effort also mental oxygen. Abdomen: soft, ntnd Skin: no rash or induration seen on limited exam Musculoskeletal: Status post left below-knee amputation, stump appears clean, no evidence of infection or silhouette is. Psychiatric: grossly normal mood and affect, speech fluent and appropriate Neurologic: grossly non-focal.          Labs on Admission:  Basic Metabolic Panel:  Recent Labs Lab 09/05/13 1510  NA 143  K 5.2  CL 108  CO2 22  GLUCOSE 157*  BUN 20  CREATININE 1.38*  CALCIUM 8.6   Liver Function Tests:  Recent Labs Lab 09/05/13 1510  AST 29  ALT <5  ALKPHOS 68  BILITOT 0.2*  PROT 6.5  ALBUMIN 2.5*   No results found for this basename: LIPASE, AMYLASE,  in the last 168 hours No results found for this basename: AMMONIA,  in the last 168 hours CBC:  Recent Labs Lab 09/05/13 1550  WBC 7.1  NEUTROABS 5.7  HGB 10.8*  HCT 32.6*  MCV 83.4  PLT 196   Cardiac Enzymes: No results found for this basename: CKTOTAL, CKMB, CKMBINDEX, TROPONINI,  in the last 168 hours  BNP (last 3 results)  Recent Labs  03/24/13 0415 07/15/13 1659 09/05/13 1510  PROBNP 20835.0* 3108.0* 10229.0*   CBG: No results found for this basename: GLUCAP,  in the last 168 hours  Radiological Exams on Admission: Dg Chest Port 1 View  (if Code Sepsis Called)  09/05/2013   CLINICAL DATA:  Cough, weakness, confusion  EXAM: PORTABLE CHEST - 1 VIEW   COMPARISON:  DG CHEST 1V PORT dated 07/15/2013  FINDINGS: Low lung volumes. Cardiac silhouette is enlarged. . Both lungs are clear. The visualized skeletal structures are unremarkable.  IMPRESSION: No active disease.   Electronically Signed   By: Salome Holmes M.D.   On: 09/05/2013 15:25    EKG: Independently reviewed.   Assessment/Plan Principal Problem:   HCAP (healthcare-associated pneumonia) Active Problems:   DIABETES MELLITUS, WITH VASCULAR COMPLICATIONS   Chronic hypertension   S/P BKA (below knee amputation) unilateral   Diabetic peripheral neuropathy   Hypertension   Acute encephalopathy   1. Healthcare associated pneumonia. Patient presenting with clinical signs and symptoms suggestive of pneumonia although chest x-ray on presentation didn't reveal acute infiltrates. Will treat empirically for resistant organisms, start IV vancomycin and aztreonam given beta-lactam allergy. Obtain blood cultures and sputum cultures, place patient on telemetry, provide supportive care. Followup on repeat chest x-ray in a.m. 2. Acute encephalopathy. Patient having a functional decline over the past week, described as being somewhat confused and disoriented. I suspect secondary to underlying infectious process likely healthcare associated pneumonia. Treat with IV antibiotic therapy.  3. History of chronic diastolic congestive heart failure. Last transthoracic echocardiogram performed on 03/09/2013 which showed an ejection fraction of 50-50%, grade 1 diastolic dysfunction. Initial lab work showing a BNP of 10,229. Will monitor daily weights, strict ins and outs, provide dose of Lasix 40 mg IV x1 as it is conceivable CHF may be contributing to present symptoms.  4. Type 2 diabetes mellitus. Will provide Accu-Cheks q. a.c. each bedtime with sliding scale coverage. 5. Hypertension. Continue clonidine, Coreg, Norvasc. Monitor vitals 6. History of stage III chronic kidney disease. Patient's creatinine at 1.38  with BUN of 20. Will monitor kidney function 7. Status post below the knee amputation.  Stump evaluated, there does not appear to be evidence of active infection. I suspect febrile illness, from healthcare associated pneumonia.  8. DVT prophylaxis. Subcutaneous heparin    Code Status: Full code Family Communication: Family members not present Disposition Plan: Will admit patient to telemetry, dyspnea require greater than 2 night hospitalization  Time spent: 70 min  Jeralyn BennettZAMORA, Toyna Erisman [facility] Pager [phone]

## 2013-09-05 NOTE — ED Notes (Signed)
Pt vomited mod. Amount.  Dr. Criss AlvineGoldston made aware.

## 2013-09-05 NOTE — ED Notes (Signed)
Pt refuses for second IV to be started.  Explained importance of getting his antibiotics.  Pt continues to refuse.

## 2013-09-06 ENCOUNTER — Inpatient Hospital Stay (HOSPITAL_COMMUNITY): Payer: Medicare Other

## 2013-09-06 LAB — COMPREHENSIVE METABOLIC PANEL
ALBUMIN: 1.8 g/dL — AB (ref 3.5–5.2)
ALBUMIN: 2.4 g/dL — AB (ref 3.5–5.2)
ALT: <5 U/L (ref 0–53)
AST: 10 U/L (ref 0–37)
AST: 22 U/L (ref 0–37)
Alkaline Phosphatase: 48 U/L (ref 39–117)
Alkaline Phosphatase: 63 U/L (ref 39–117)
BILIRUBIN TOTAL: 0.3 mg/dL (ref 0.3–1.2)
BUN: 21 mg/dL (ref 6–23)
BUN: 29 mg/dL — ABNORMAL HIGH (ref 6–23)
CALCIUM: 8.2 mg/dL — AB (ref 8.4–10.5)
CHLORIDE: 85 meq/L — AB (ref 96–112)
CO2: 18 mEq/L — ABNORMAL LOW (ref 19–32)
CO2: 21 meq/L (ref 19–32)
CREATININE: 1.45 mg/dL — AB (ref 0.50–1.35)
Calcium: 6.3 mg/dL — CL (ref 8.4–10.5)
Chloride: 106 mEq/L (ref 96–112)
Creatinine, Ser: 1.94 mg/dL — ABNORMAL HIGH (ref 0.50–1.35)
GFR calc Af Amer: 56 mL/min — ABNORMAL LOW (ref >90)
GFR calc Af Amer: 39 mL/min — ABNORMAL LOW (ref >90)
GFR calc non Af Amer: 48 mL/min — ABNORMAL LOW (ref >90)
GFR, EST NON AFRICAN AMERICAN: 34 mL/min — AB (ref >90)
Glucose, Bld: 1024 mg/dL (ref 70–99)
Glucose, Bld: 150 mg/dL — ABNORMAL HIGH (ref 70–99)
Potassium: 3.7 mEq/L (ref 3.7–5.3)
Potassium: 5 mEq/L (ref 3.7–5.3)
SODIUM: 116 meq/L — AB (ref 137–147)
SODIUM: 142 meq/L (ref 137–147)
Total Bilirubin: 0.3 mg/dL (ref 0.3–1.2)
Total Protein: 4.7 g/dL — ABNORMAL LOW (ref 6.0–8.3)
Total Protein: 6.6 g/dL (ref 6.0–8.3)

## 2013-09-06 LAB — CBC
HCT: 34.2 % — ABNORMAL LOW (ref 39.0–52.0)
HEMATOCRIT: 31.4 % — AB (ref 39.0–52.0)
HEMOGLOBIN: 9.5 g/dL — AB (ref 13.0–17.0)
HEMOGLOBIN: 11.3 g/dL — AB (ref 13.0–17.0)
MCH: 27 pg (ref 26.0–34.0)
MCH: 27.8 pg (ref 26.0–34.0)
MCHC: 30.3 g/dL (ref 30.0–36.0)
MCHC: 33 g/dL (ref 30.0–36.0)
MCV: 89.2 fL (ref 78.0–100.0)
MCV: 84.2 fL (ref 78.0–100.0)
Platelets: 165 10*3/uL (ref 150–400)
Platelets: 203 10*3/uL (ref 150–400)
RBC: 3.52 MIL/uL — AB (ref 4.22–5.81)
RBC: 4.06 MIL/uL — ABNORMAL LOW (ref 4.22–5.81)
RDW: 15 % (ref 11.5–15.5)
RDW: 15 % (ref 11.5–15.5)
WBC: 9.1 10*3/uL (ref 4.0–10.5)
WBC: 9.3 10*3/uL (ref 4.0–10.5)

## 2013-09-06 LAB — GLUCOSE, CAPILLARY
GLUCOSE-CAPILLARY: 185 mg/dL — AB (ref 70–99)
Glucose-Capillary: 163 mg/dL — ABNORMAL HIGH (ref 70–99)
Glucose-Capillary: 172 mg/dL — ABNORMAL HIGH (ref 70–99)
Glucose-Capillary: 133 mg/dL — ABNORMAL HIGH (ref 70–99)

## 2013-09-06 LAB — VANCOMYCIN, TROUGH: Vancomycin Tr: 24 ug/mL — ABNORMAL HIGH (ref 10.0–20.0)

## 2013-09-06 LAB — STREP PNEUMONIAE URINARY ANTIGEN: STREP PNEUMO URINARY ANTIGEN: NEGATIVE

## 2013-09-06 LAB — MRSA PCR SCREENING: MRSA by PCR: POSITIVE — AB

## 2013-09-06 LAB — CREATININE, SERUM
Creatinine, Ser: 1.45 mg/dL — ABNORMAL HIGH (ref 0.50–1.35)
GFR, EST AFRICAN AMERICAN: 56 mL/min — AB (ref >90)
GFR, EST NON AFRICAN AMERICAN: 48 mL/min — AB (ref >90)

## 2013-09-06 LAB — HEPARIN LEVEL (UNFRACTIONATED): Heparin Unfractionated: 0.46 IU/mL (ref 0.30–0.70)

## 2013-09-06 MED ORDER — HEPARIN SODIUM (PORCINE) 5000 UNIT/ML IJ SOLN
5000.0000 [IU] | Freq: Three times a day (TID) | INTRAMUSCULAR | Status: DC
  Filled 2013-09-06: qty 1

## 2013-09-06 MED ORDER — GLUCERNA SHAKE PO LIQD
237.0000 mL | Freq: Every day | ORAL | Status: DC
  Administered 2013-09-07 – 2013-09-10 (×4): 237 mL via ORAL

## 2013-09-06 MED ORDER — APIXABAN 5 MG PO TABS: 5.0000 mg | ORAL_TABLET | Freq: Two times a day (BID) | ORAL | Status: DC

## 2013-09-06 MED ORDER — HEPARIN (PORCINE) IN NACL 100-0.45 UNIT/ML-% IJ SOLN
1600.0000 [IU]/h | INTRAMUSCULAR | Status: AC
  Administered 2013-09-06 (×2): 1600 [IU]/h via INTRAVENOUS
  Filled 2013-09-06 (×3): qty 250

## 2013-09-06 MED ORDER — HEPARIN BOLUS VIA INFUSION
4000.0000 [IU] | Freq: Once | INTRAVENOUS | Status: AC
  Administered 2013-09-06: 4000 [IU] via INTRAVENOUS
  Filled 2013-09-06: qty 4000

## 2013-09-06 MED ORDER — SODIUM CHLORIDE 0.9 % IV SOLN
INTRAVENOUS | Status: DC
  Administered 2013-09-06 – 2013-09-07 (×2): via INTRAVENOUS
  Administered 2013-09-08: 50 mL/h via INTRAVENOUS
  Administered 2013-09-08 – 2013-09-09 (×2): via INTRAVENOUS

## 2013-09-06 MED ORDER — AZTREONAM 2 G IJ SOLR
2.0000 g | Freq: Three times a day (TID) | INTRAMUSCULAR | Status: DC
  Administered 2013-09-06 – 2013-09-11 (×14): 2 g via INTRAVENOUS
  Filled 2013-09-06 (×21): qty 2

## 2013-09-06 MED ORDER — APIXABAN 5 MG PO TABS
5.0000 mg | ORAL_TABLET | Freq: Two times a day (BID) | ORAL | Status: DC
  Filled 2013-09-06 (×2): qty 1

## 2013-09-06 MED ORDER — ACETAMINOPHEN 325 MG PO TABS
650.0000 mg | ORAL_TABLET | Freq: Four times a day (QID) | ORAL | Status: DC | PRN
  Administered 2013-09-06: 650 mg via ORAL
  Filled 2013-09-06: qty 2

## 2013-09-06 MED ORDER — MUPIROCIN 2 % EX OINT
1.0000 "application " | TOPICAL_OINTMENT | Freq: Two times a day (BID) | CUTANEOUS | Status: AC
  Administered 2013-09-06 – 2013-09-10 (×10): 1 via NASAL
  Filled 2013-09-06: qty 22

## 2013-09-06 MED ORDER — IOHEXOL 350 MG/ML SOLN
100.0000 mL | Freq: Once | INTRAVENOUS | Status: AC | PRN
  Administered 2013-09-06: 75 mL via INTRAVENOUS

## 2013-09-06 MED ORDER — CLONIDINE HCL 0.1 MG PO TABS
0.1000 mg | ORAL_TABLET | ORAL | Status: DC
  Administered 2013-09-06: 0.1 mg via ORAL
  Filled 2013-09-06: qty 1

## 2013-09-06 MED ORDER — APIXABAN 5 MG PO TABS
10.0000 mg | ORAL_TABLET | Freq: Two times a day (BID) | ORAL | Status: DC
  Administered 2013-09-06 – 2013-09-07 (×2): 10 mg via ORAL
  Filled 2013-09-06 (×4): qty 2

## 2013-09-06 MED ORDER — CARVEDILOL 12.5 MG PO TABS
12.5000 mg | ORAL_TABLET | Freq: Two times a day (BID) | ORAL | Status: DC
  Administered 2013-09-06 – 2013-09-11 (×11): 12.5 mg via ORAL
  Filled 2013-09-06 (×13): qty 1

## 2013-09-06 MED ORDER — CHLORHEXIDINE GLUCONATE CLOTH 2 % EX PADS
6.0000 | MEDICATED_PAD | Freq: Every day | CUTANEOUS | Status: AC
  Administered 2013-09-06 – 2013-09-10 (×5): 6 via TOPICAL

## 2013-09-06 MED ORDER — INSULIN ASPART 100 UNIT/ML ~~LOC~~ SOLN: 0.0000 [IU] | Freq: Every day | SUBCUTANEOUS | Status: DC

## 2013-09-06 MED ORDER — TAMSULOSIN HCL 0.4 MG PO CAPS
0.4000 mg | ORAL_CAPSULE | Freq: Every day | ORAL | Status: DC
  Administered 2013-09-06 – 2013-09-11 (×6): 0.4 mg via ORAL
  Filled 2013-09-06 (×6): qty 1

## 2013-09-06 MED ORDER — FUROSEMIDE 10 MG/ML IJ SOLN
40.0000 mg | Freq: Once | INTRAMUSCULAR | Status: AC
  Administered 2013-09-06: 40 mg via INTRAVENOUS
  Filled 2013-09-06: qty 4

## 2013-09-06 MED ORDER — HEPARIN SODIUM (PORCINE) 5000 UNIT/ML IJ SOLN: 5000.0000 [IU] | Freq: Three times a day (TID) | INTRAMUSCULAR | Status: DC

## 2013-09-06 MED ORDER — DIPHENHYDRAMINE HCL 25 MG PO CAPS
25.0000 mg | ORAL_CAPSULE | Freq: Four times a day (QID) | ORAL | Status: DC | PRN
  Administered 2013-09-06: 25 mg via ORAL
  Filled 2013-09-06: qty 1

## 2013-09-06 MED ORDER — TRIAMCINOLONE ACETONIDE 0.025 % EX CREA
TOPICAL_CREAM | Freq: Three times a day (TID) | CUTANEOUS | Status: DC | PRN
  Filled 2013-09-06: qty 15

## 2013-09-06 MED ORDER — INSULIN ASPART 100 UNIT/ML ~~LOC~~ SOLN
0.0000 [IU] | Freq: Three times a day (TID) | SUBCUTANEOUS | Status: DC
  Administered 2013-09-06: 3 [IU] via SUBCUTANEOUS
  Administered 2013-09-06: 2 [IU] via SUBCUTANEOUS

## 2013-09-06 MED ORDER — AMLODIPINE BESYLATE 5 MG PO TABS
5.0000 mg | ORAL_TABLET | Freq: Every day | ORAL | Status: DC
  Administered 2013-09-06 – 2013-09-08 (×3): 5 mg via ORAL
  Filled 2013-09-06 (×3): qty 1

## 2013-09-06 NOTE — Progress Notes (Addendum)
INITIAL NUTRITION ASSESSMENT  DOCUMENTATION CODES Per approved criteria  -Obesity Unspecified   INTERVENTION: Glucerna Shake po daily, each supplement provides 220 kcal and 10 grams of protein RD to follow for nutrition care plan  NUTRITION DIAGNOSIS: Increased nutrient needs related to acute illness as evidenced by estimated nutrition needs  Goal: Pt to meet >/= 90% of their estimated nutrition needs   Monitor:  PO & supplemental intake, weight, labs, I/O's  Reason for Assessment: Malnutrition Screening Tool Report  68 y.o. male  Admitting Dx: HCAP (healthcare-associated pneumonia)  ASSESSMENT: 68 y.o. male with PMH of left below-the-knee amputation, poorly controlled type 2 diabetes mellitus, hypertension, [facility], who was referred to the emergency department with complaints of fevers and chills.   Chest x-ray performed on admission showing no active disease. CBC showed a white count within normal limits at 7100. Given clinical suspicion for healthcare social pneumonia patient was started on broad-spectrum IV antibiotic therapy.  Patient reports his appetite is good, however, PTA he wasn't eating very well given acute illness; receives Prostat liquid protein 30 ml po daily at SNF; states he's lost ~ 16 lbs in the past 2-3 months, however, wt readings do not reflect this (wt has trended up); pt would like Glucerna Shake daily -- RD to order.  No muscle or subcutaneous fat depletion noticed.  Height: Ht Readings from Last 1 Encounters:  09/05/13 5\' 10"  (1.778 m)    Weight: Wt Readings from Last 1 Encounters:  09/06/13 228 lb 9.9 oz (103.7 kg)    Ideal Body Weight: 156 lb -- adjusted for BKA  % Ideal Body Weight: 146%  Wt Readings from Last 10 Encounters:  09/06/13 228 lb 9.9 oz (103.7 kg)  07/19/13 219 lb 8 oz (99.565 kg)  07/19/13 219 lb 8 oz (99.565 kg)  07/05/13 224 lb 6.9 oz (101.8 kg)  07/05/13 224 lb 6.9 oz  (101.8 kg)  05/02/13 211 lb (95.709 kg)  04/24/13 209 lb (94.802 kg)  04/21/13 209 lb (94.802 kg)  04/11/13 212 lb (96.163 kg)  03/27/13 223 lb 15.8 oz (101.6 kg)    Usual Body Weight: 219 lb  % Usual Body Weight: 104%  BMI: 34.9 kg/m2 -- adjusted for BKA  Estimated Nutritional Needs: Kcal: 2100-2300 Protein: 110-120 gm Fluid: 2.1-2.3 L  Skin: Intact  Diet Order: Carb Control  EDUCATION NEEDS: -No education needs identified at this time   Intake/Output Summary (Last 24 hours) at 09/06/13 1146 Last data filed at 09/05/13 1843  Gross per 24 hour  Intake    500 ml  Output     70 ml  Net    430 ml    Labs:   Recent Labs Lab 09/05/13 1510 09/06/13 0323 09/06/13 0925  NA 143 116* 142  K 5.2 3.7 5.0  CL 108 85* 106  CO2 22 18* 21  BUN 20 21 29*  CREATININE 1.38* 1.45*  1.45* 1.94*  CALCIUM 8.6 6.3* 8.2*  GLUCOSE 157* 1024* 150*    CBG (last 3)   Recent Labs  09/06/13 0503 09/06/13 0816  GLUCAP 185* 163*    Scheduled Meds: . amLODipine  5 mg Oral Daily  . aztreonam  2 g Intravenous 3 times per day  . carvedilol  12.5 mg Oral BID WC  . Chlorhexidine Gluconate Cloth  6 each Topical Q0600  . cloNIDine  0.1 mg Oral Q72H  . insulin aspart  0-15 Units Subcutaneous TID WC  . insulin aspart  0-5  Units Subcutaneous QHS  . mupirocin ointment  1 application Nasal BID  . tamsulosin  0.4 mg Oral Daily    Continuous Infusions: . heparin 1,600 Units/hr (09/06/13 1610)    Past Medical History  Diagnosis Date  . Diabetes mellitus   . Hypertension   . Hyperlipidemia   . CHF (congestive heart failure)   . Enlarged heart   . Pneumonia     hx of   . Osteomyelitis of foot     LT  . CKD (chronic kidney disease), stage IV     Past Surgical History  Procedure Laterality Date  . Right leg fracture    . Skin graft    . Knee surgery    . Tee without cardioversion  03/17/2012    Procedure: TRANSESOPHAGEAL ECHOCARDIOGRAM (TEE);  Surgeon: Ricki Rodriguez,  MD;  Location: [facility] ENDOSCOPY;  Service: Cardiovascular;  Laterality: N/A;  . Incision and drainage Left 07/02/2013    Procedure: INCISION AND DRAINAGE LEFT FOOT;  Surgeon: Kathryne Hitch, MD;  Location: [facility] ORS;  Service: Orthopedics;  Laterality: Left;  . Amputation Left 07/18/2013    Procedure: AMPUTATION BELOW KNEE;  Surgeon: Kathryne Hitch, MD;  Location: [facility] OR;  Service: Orthopedics;  Laterality: Left;    Maureen Chatters, RD, LDN Pager #: [phone] After-Hours Pager #: [phone]

## 2013-09-06 NOTE — Progress Notes (Signed)
Utilization Review Completed.  

## 2013-09-06 NOTE — Progress Notes (Signed)
ANTICOAGULATION CONSULT NOTE - Initial Consult  Pharmacy Consult for heparin Indication: pulmonary embolus  Allergies  Allergen Reactions  . Ancef [Cefazolin] Itching and Rash  . Sulfa Antibiotics Itching and Rash    Patient Measurements: Height: 5\' 10"  (177.8 cm) Weight: 228 lb 9.9 oz (103.7 kg) IBW/kg (Calculated) : 73 Heparin Dosing Weight: 95kg  Vital Signs: Temp: 98.2 F (36.8 C) (03/31 2346) Temp src: Oral (03/31 2346) BP: 103/70 mmHg (03/31 2346) Pulse Rate: 116 (03/31 2346)  Labs:  Recent Labs  09/05/13 1510 09/05/13 1550  HGB  --  10.8*  HCT  --  32.6*  PLT  --  196  LABPROT 13.8  --   INR 1.08  --   CREATININE 1.38*  --     Estimated Creatinine Clearance: 61.8 ml/min (by C-G formula based on Cr of 1.38).   Medical History: Past Medical History  Diagnosis Date  . Diabetes mellitus   . Hypertension   . Hyperlipidemia   . CHF (congestive heart failure)   . Enlarged heart   . Pneumonia     hx of   . Osteomyelitis of foot     LT  . CKD (chronic kidney disease), stage IV     Medications:  Prescriptions prior to admission  Medication Sig Dispense Refill  . Amino Acids-Protein Hydrolys (FEEDING SUPPLEMENT, PRO-STAT SUGAR FREE 64,) LIQD Take 30 mLs by mouth daily.       . carvedilol (COREG) 12.5 MG tablet Take 1 tablet (12.5 mg total) by mouth 2 (two) times daily with a meal.  60 tablet  3  . cloNIDine (CATAPRES) 0.1 MG tablet Take 0.1 mg by mouth every 3 (three) days.      . diphenhydrAMINE (BENADRYL) 50 MG tablet Take 50 mg by mouth at bedtime as needed for itching.      Marland Kitchen HYDROcodone-acetaminophen (NORCO/VICODIN) 5-325 MG per tablet Take one tablet by mouth every four hours as needed for moderate pain  180 tablet  0  . insulin aspart (NOVOLOG) 100 UNIT/ML injection Inject into the skin 3 (three) times daily with meals. SSI 200-300   7 UNITS; 301-400  9 UNITS; 401-500   12  UNITS; > 500 14  UNITS AND CALL MD   CBG AC AND QHS      . Menthol-Methyl  Salicylate (MUSCLE RUB) 10-15 % CREA Apply 1 application topically as needed (neck and shoulder pain).      . Multiple Vitamins-Minerals (CENTRUM SILVER ADULT 50+) TABS Take 0.5 tablets by mouth daily.      Marland Kitchen omeprazole (PRILOSEC) 20 MG capsule Take 20 mg by mouth daily.      . ondansetron (ZOFRAN) 4 MG tablet Take 1 tablet (4 mg total) by mouth every 6 (six) hours as needed for nausea.  20 tablet  0  . Ostomy Supplies (MOISTURE BARRIER SKIN) OINT Apply 1 application topically 2 (two) times daily. For sacrum      . Tamsulosin HCl (FLOMAX) 0.4 MG CAPS Take 1 capsule (0.4 mg total) by mouth daily.  30 capsule  3  . triamcinolone cream (KENALOG) 0.1 % Apply 1 application topically 3 (three) times daily. On back and bottom      . amLODipine (NORVASC) 5 MG tablet Take 5 mg by mouth daily.       Scheduled:  . amLODipine  5 mg Oral Daily  . aztreonam  2 g Intravenous 3 times per day  . carvedilol  12.5 mg Oral BID WC  . Chlorhexidine Gluconate  Cloth  6 each Topical [id number]  . cloNIDine  0.1 mg Oral Q72H  . insulin aspart  0-15 Units Subcutaneous TID WC  . insulin aspart  0-5 Units Subcutaneous QHS  . mupirocin ointment  1 application Nasal BID  . tamsulosin  0.4 mg Oral Daily  . vancomycin  1,000 mg Intravenous Q12H    Assessment: 68yo male NH resident c/o csugh since Saturday and now w/ fever to 102.5, initially tx'd as PNA but CT also reveals small PE, to begin heparin.  Goal of Therapy:  Heparin level 0.3-0.7 units/ml Monitor platelets by anticoagulation protocol: Yes   Plan:  Will give heparin 4000 units IV bolus x1 followed by gtt at 1600 units/hr and monitor heparin levels and CBC.  Vernard GamblesVeronda Mykah Bellomo, PharmD, BCPS  09/06/2013,3:02 AM

## 2013-09-06 NOTE — Progress Notes (Signed)
ANTICOAGULATION and ANTIBIOTIC CONSULT NOTE - Follow Up Consult  Pharmacy Consult for heparin; vancomycin Indication: pulmonary embolus; PNA  Allergies  Allergen Reactions  . Ancef [Cefazolin] Itching and Rash  . Sulfa Antibiotics Itching and Rash    Patient Measurements: Height: 5\' 10"  (177.8 cm) Weight: 228 lb 9.9 oz (103.7 kg) IBW/kg (Calculated) : 73 Heparin Dosing Weight: 95 kg  Vital Signs: Temp: 100.9 F (38.3 C) (04/01 0826) Temp src: Oral (04/01 0826) BP: 111/59 mmHg (04/01 0826) Pulse Rate: 101 (04/01 0826)  Labs:  Recent Labs  09/05/13 1510  09/05/13 1550 09/06/13 0323 09/06/13 0925  HGB  --   < > 10.8* 9.5* 11.3*  HCT  --   --  32.6* 31.4* 34.2*  PLT  --   --  196 165 203  LABPROT 13.8  --   --   --   --   INR 1.08  --   --   --   --   HEPARINUNFRC  --   --   --   --  0.46  CREATININE 1.38*  --   --  1.45*  1.45*  --   < > = values in this interval not displayed.  Estimated Creatinine Clearance: 58.8 ml/min (by C-G formula based on Cr of 1.45).  Assessment: Patient is a 68 y.o M on heparin for new PE.  Heparin level is therapeutic with current rate of 1600 units/hr. No bleeding documented.  He's also on abx day #2 for suspected PNA.  He has a hx CKD with scr increased noticeably from 1.38 an admission to 1.94 today.    4/01 vanc>> 4/01 aztreo>>  3/31 bcx x2>> ngtd 3/31 ucx >> pending MRSA pcr POSITIVE  Goal of Therapy:  Heparin level 0.3-0.7 units/ml; vancomycin trough level 15-20 Monitor platelets by anticoagulation protocol: Yes   Plan:  1) continue heparin drip at 1600 units/hr 2) check vancomycin trough level 3PM today to r/o accumulation  Aharon Carriere P 09/06/2013,11:06 AM

## 2013-09-06 NOTE — Progress Notes (Signed)
ANTICOAGULATION CONSULT NOTE - Follow Up Consult  Pharmacy Consult for Heparin-> Eliquis Indication: pulmonary embolus  Allergies  Allergen Reactions  . Ancef [Cefazolin] Itching and Rash  . Sulfa Antibiotics Itching and Rash    Patient Measurements: Height: 5\' 10"  (177.8 cm) Weight: 228 lb 9.9 oz (103.7 kg) IBW/kg (Calculated) : 73  Vital Signs: Temp: 99.1 F (37.3 C) (04/01 1217) Temp src: Oral (04/01 1217) BP: 120/49 mmHg (04/01 1217) Pulse Rate: 93 (04/01 1217)  Labs:  Recent Labs  09/05/13 1510  09/05/13 1550 09/06/13 0323 09/06/13 0925  HGB  --   < > 10.8* 9.5* 11.3*  HCT  --   --  32.6* 31.4* 34.2*  PLT  --   --  196 165 203  LABPROT 13.8  --   --   --   --   INR 1.08  --   --   --   --   HEPARINUNFRC  --   --   --   --  0.46  CREATININE 1.38*  --   --  1.45*  1.45* 1.94*  < > = values in this interval not displayed.  Estimated Creatinine Clearance: 44 ml/min (by C-G formula based on Cr of 1.94).  Assessment:   To transition from heparin infusion to Eliquis for PE.  Heparin currently infusing at 1600 units/hr.  Heparin level was therapeutic (0.46) this am.    Creatinine has risen some, but crcl is still in the range for standard full-dosing with Eliquis.  Goal of Therapy:  full-anticoagulation in Monitor platelets by anticoagulation protocol: Yes   Plan:   Continue heparin at 1600 units/hr for now.  Stop drip at 6pm, and begin Eliquis at that time.  Eliquis 10 mg BID x 7 days, then 5 mg BID.  Will watch renal function for any need to adjust dosing.  Dennie FettersEgan, Zebulan Hinshaw Donovan, RPh Pager: [phone] 09/06/2013,3:50 PM

## 2013-09-06 NOTE — Progress Notes (Signed)
[place] TEAM 1 - Stepdown/ICU TEAM Progress Note  Justin Bures Sr. ZOX:[id number] DOB: 08/11/1945 DOA: 09/05/2013 PCP: [name], MD  Admit HPI / Brief Narrative: 68 y.o. male with a history of left below-the-knee amputation, poorly controlled type 2 diabetes mellitus, hypertension, currently residing at [facility], who was referred to the emergency department with complaints of fevers and chills. Patient reported a one-week history of cough, shortness of breath, with associated sputum characterized as yellow/green, associated with an overall functional decline. Patient over the preceding week had been increasingly lethargic, unable to participate with physical therapy, having minimal by mouth intake and increased confusion. Chest x-ray performed on admission showing no active disease. CBC showed a white count within normal limits at 7100. Given clinical suspicion for healthcare pneumonia patient was started on broad-spectrum IV antibiotic therapy with vancomycin and aztreonam in the emergency room.   HPI/Subjective: The patient states that he feels better overall.  He denies chest pain nausea vomiting or abdominal pain.  He denies shortness of breath when at rest.  Assessment/Plan:  [facility] associated pneumonia Continue empiric antibiotic therapy  Small LUL region PE Full anticoagulation  7 mm ground-glass attenuation nodular opacity in the periphery of the lingula Followup imaging after antibiotic therapy will be indicated  Acute toxic metabolic encephalopathy Improving with treatment of above - follow  Chronic diastolic congestive heart failure TTE 03/09/2013 showed an ejection fraction of 50-50%, grade 1 diastolic dysfunction - clinically compensated at present  Type 2 diabetes mellitus Follow CBG without change today  Hypertension Blood pressure well controlled at present time  Stage III chronic kidney disease Baseline creatinine reportedly  1.9 - follow trend  Anemia of chronic disease Hemoglobin appears stable at this time  Status post below the knee amputation  Obesity - Body mass index is 32.8 kg/(m^2).  MRSA screen positive  Code Status: FULL Family Communication: no family present at time of exam Disposition Plan: SDU  Consultants: none  Procedures: none  Antibiotics: Aztreonam 3/31 >> Vanc 3/31 >>  DVT prophylaxis: IV heparin   Objective: Blood pressure 120/49, pulse 93, temperature 99.1 F (37.3 C), temperature source Oral, resp. rate 23, height 5\' 10"  (1.778 m), weight 103.7 kg (228 lb 9.9 oz), SpO2 93.00%.  Intake/Output Summary (Last 24 hours) at 09/06/13 1314 Last data filed at 09/05/13 1843  Gross per 24 hour  Intake    500 ml  Output     70 ml  Net    430 ml   Exam: General: No acute respiratory distress while at rest Lungs: Mild bibasilar crackles right greater than left with no wheeze Cardiovascular: Tachycardic but regular without gallop rub or appreciable murmur Abdomen: Nontender, nondistended, soft, bowel sounds positive, no rebound, no ascites, no appreciable mass Extremities: No significant cyanosis, clubbing, or edema bilateral lower extremities - left BKA stump without acute findings  Data Reviewed: Basic Metabolic Panel:  Recent Labs Lab 09/05/13 1510 09/06/13 0323 09/06/13 0925  NA 143 116* 142  K 5.2 3.7 5.0  CL 108 85* 106  CO2 22 18* 21  GLUCOSE 157* 1024* 150*  BUN 20 21 29*  CREATININE 1.38* 1.45*  1.45* 1.94*  CALCIUM 8.6 6.3* 8.2*   Liver Function Tests:  Recent Labs Lab 09/05/13 1510 09/06/13 0323 09/06/13 0925  AST 29 10 22   ALT <5 <5 <5  ALKPHOS 68 48 63  BILITOT 0.2* 0.3 0.3  PROT 6.5 4.7* 6.6  ALBUMIN 2.5* 1.8* 2.4*   CBC:  Recent Labs  Lab 09/05/13 1550 09/06/13 0323 09/06/13 0925  WBC 7.1 9.1 9.3  NEUTROABS 5.7  --   --   HGB 10.8* 9.5* 11.3*  HCT 32.6* 31.4* 34.2*  MCV 83.4 89.2 84.2  PLT 196 165 203   CBG:  Recent  Labs Lab 09/06/13 0503 09/06/13 0816 09/06/13 1214  GLUCAP 185* 163* 172*    Recent Results (from the past 240 hour(s))  CULTURE, BLOOD (ROUTINE X 2)     Status: None   Collection Time    09/05/13  3:29 PM      Result Value Ref Range Status   Specimen Description BLOOD ARM LEFT   Final   Special Requests BOTTLES DRAWN AEROBIC AND ANAEROBIC 5CC   Final   Culture  Setup Time     Final   Value: 09/05/2013 20:00     Performed at [facility]   Culture     Final   Value:        BLOOD CULTURE RECEIVED NO GROWTH TO DATE CULTURE WILL BE HELD FOR 5 DAYS BEFORE ISSUING A FINAL NEGATIVE REPORT     Performed at [facility]   Report Status PENDING   Incomplete  CULTURE, BLOOD (ROUTINE X 2)     Status: None   Collection Time    09/05/13  3:34 PM      Result Value Ref Range Status   Specimen Description BLOOD LEFT FOREARM   Final   Special Requests BOTTLES DRAWN AEROBIC ONLY 10CC   Final   Culture  Setup Time     Final   Value: 09/05/2013 20:00     Performed at [facility]   Culture     Final   Value:        BLOOD CULTURE RECEIVED NO GROWTH TO DATE CULTURE WILL BE HELD FOR 5 DAYS BEFORE ISSUING A FINAL NEGATIVE REPORT     Performed at [facility]   Report Status PENDING   Incomplete  MRSA PCR SCREENING     Status: Abnormal   Collection Time    09/05/13  8:59 PM      Result Value Ref Range Status   MRSA by PCR POSITIVE (*) NEGATIVE Final   Comment:            The GeneXpert MRSA Assay (FDA     approved for NASAL specimens     only), is one component of a     comprehensive MRSA colonization     surveillance program. It is not     intended to diagnose MRSA     infection nor to guide or     monitor treatment for     MRSA infections.     RESULT CALLED TO, READ BACK BY AND VERIFIED WITH:     T.THOMAS,RN 0145 09/06/13 M.CAMPBELL     Studies:  Recent x-ray studies have been reviewed in detail by the Attending Physician  Scheduled Meds:  Scheduled  Meds: . amLODipine  5 mg Oral Daily  . aztreonam  2 g Intravenous 3 times per day  . carvedilol  12.5 mg Oral BID WC  . Chlorhexidine Gluconate Cloth  6 each Topical Q0600  . cloNIDine  0.1 mg Oral Q72H  . feeding supplement (GLUCERNA SHAKE)  237 mL Oral Q1500  . insulin aspart  0-15 Units Subcutaneous TID WC  . insulin aspart  0-5 Units Subcutaneous QHS  . mupirocin ointment  1 application Nasal BID  . tamsulosin  0.4 mg  Oral Daily    Time spent on care of this patient: 35 mins   [facility] West Medical CenterMCCLUNG,Lakeshia Dohner T  [facility] Office  [phone] Pager - Text Page per Loretha StaplerAmion as per below:  On-Call/Text Page:      [email or web address]      password [email or web address]  If 7PM-7AM, please contact night-coverage [email or web address] Password [email or web address] 09/06/2013, 1:14 PM   LOS: 1 day

## 2013-09-06 NOTE — Progress Notes (Signed)
ANTIBIOTIC CONSULT NOTE - FOLLOW UP  Pharmacy Consult for Vancomycin and Aztreonam Indication: HCAP  Allergies  Allergen Reactions  . Ancef [Cefazolin] Itching and Rash  . Sulfa Antibiotics Itching and Rash    Patient Measurements: Height: 5\' 10"  (177.8 cm) Weight: 228 lb 9.9 oz (103.7 kg) IBW/kg (Calculated) : 73  Vital Signs: Temp: 98.4 F (36.9 C) (04/01 1942) Temp src: Oral (04/01 1942) BP: 101/59 mmHg (04/01 1942) Pulse Rate: 89 (04/01 1942) Intake/Output from previous day: 03/31 0701 - 04/01 0700 In: 500 [I.V.:500] Out: 70 [Urine:70] Intake/Output from this shift: Total I/O In: -  Out: 300 [Urine:300]  Labs:  Recent Labs  09/05/13 1510 09/05/13 1550 09/06/13 0323 09/06/13 0925  WBC  --  7.1 9.1 9.3  HGB  --  10.8* 9.5* 11.3*  PLT  --  196 165 203  CREATININE 1.38*  --  1.45*  1.45* 1.94*   Estimated Creatinine Clearance: 44 ml/min (by C-G formula based on Cr of 1.94).  Recent Labs  09/06/13 1620  VANCOTROUGH 24.0*    Assessment:    Day #2 Vancomycin and Aztreonam for HCAP coverage.  Creatinine trended up from 1.34->1.45->1.94  Vancomycin 2 grams IV loading dose given on 3/31 at 4pm, then first maintenance dose of 1 gram IV q12h regimen given ~3am today.  Vancomycin level ~ 13 hrs later = 24 mcg/ml.  Above goal. Also had 75 ml of contrast with CT early am.  Foley placed earlier tonight due to urinary retention. 300 ml out.  NS now infusing at 75 ml/hr.  Hesitant to empirically resume Vancomycin without assuring adequate UOP.  Goal of Therapy:  Vancomycin trough level 15-20 mcg/ml appropriate Azactam dose for renal function and infection  Plan:   Will re-check random Vancomycin level in am, which will be ~ 24hrs after last dose.   Re-dose when Vanc trough is 15-20 mcg/ml and UOP is adequate.   Continue Azactam 2 grams IV q8hrs.   Follow renal function, culture data and clinical progress.  Dennie FettersEgan, Angi Goodell Donovan, RPh Pager: [phone] 09/06/2013,10:39  PM

## 2013-09-06 NOTE — Progress Notes (Signed)
CT Angio Chest positive for PE and PNA.  Heparin Infusion per pharmacy.  Algis DownsMarianne York, PA-C [facility] Pager: [phone]

## 2013-09-07 DIAGNOSIS — N189 Chronic kidney disease, unspecified: Secondary | ICD-10-CM

## 2013-09-07 DIAGNOSIS — N179 Acute kidney failure, unspecified: Secondary | ICD-10-CM

## 2013-09-07 LAB — URINALYSIS, ROUTINE W REFLEX MICROSCOPIC
Bilirubin Urine: NEGATIVE
Glucose, UA: NEGATIVE mg/dL
Ketones, ur: NEGATIVE mg/dL
Nitrite: NEGATIVE
PH: 5 (ref 5.0–8.0)
Protein, ur: 100 mg/dL — AB
Specific Gravity, Urine: 1.028 (ref 1.005–1.030)
Urobilinogen, UA: 0.2 mg/dL (ref 0.0–1.0)

## 2013-09-07 LAB — CBC
HEMATOCRIT: 28.1 % — AB (ref 39.0–52.0)
Hemoglobin: 9.2 g/dL — ABNORMAL LOW (ref 13.0–17.0)
MCH: 27.3 pg (ref 26.0–34.0)
MCHC: 32.7 g/dL (ref 30.0–36.0)
MCV: 83.4 fL (ref 78.0–100.0)
PLATELETS: 172 10*3/uL (ref 150–400)
RBC: 3.37 MIL/uL — ABNORMAL LOW (ref 4.22–5.81)
RDW: 15.1 % (ref 11.5–15.5)
WBC: 5 10*3/uL (ref 4.0–10.5)

## 2013-09-07 LAB — URINE MICROSCOPIC-ADD ON

## 2013-09-07 LAB — GLUCOSE, CAPILLARY
GLUCOSE-CAPILLARY: 125 mg/dL — AB (ref 70–99)
Glucose-Capillary: 110 mg/dL — ABNORMAL HIGH (ref 70–99)
Glucose-Capillary: 104 mg/dL — ABNORMAL HIGH (ref 70–99)
Glucose-Capillary: 123 mg/dL — ABNORMAL HIGH (ref 70–99)
Glucose-Capillary: 146 mg/dL — ABNORMAL HIGH (ref 70–99)

## 2013-09-07 LAB — BASIC METABOLIC PANEL
BUN: 37 mg/dL — ABNORMAL HIGH (ref 6–23)
CO2: 23 meq/L (ref 19–32)
Calcium: 7.7 mg/dL — ABNORMAL LOW (ref 8.4–10.5)
Chloride: 109 mEq/L (ref 96–112)
Creatinine, Ser: 2.27 mg/dL — ABNORMAL HIGH (ref 0.50–1.35)
GFR calc non Af Amer: 28 mL/min — ABNORMAL LOW (ref >90)
GFR, EST AFRICAN AMERICAN: 32 mL/min — AB (ref >90)
Glucose, Bld: 93 mg/dL (ref 70–99)
POTASSIUM: 4.3 meq/L (ref 3.7–5.3)
Sodium: 145 mEq/L (ref 137–147)

## 2013-09-07 LAB — LEGIONELLA ANTIGEN, URINE: Legionella Antigen, Urine: NEGATIVE

## 2013-09-07 LAB — VANCOMYCIN, RANDOM: VANCOMYCIN RM: 21.5 ug/mL

## 2013-09-07 MED ORDER — INSULIN ASPART 100 UNIT/ML ~~LOC~~ SOLN
0.0000 [IU] | Freq: Three times a day (TID) | SUBCUTANEOUS | Status: DC
  Administered 2013-09-07 (×2): 2 [IU] via SUBCUTANEOUS
  Administered 2013-09-08: 3 [IU] via SUBCUTANEOUS
  Administered 2013-09-08 – 2013-09-10 (×3): 2 [IU] via SUBCUTANEOUS
  Administered 2013-09-11: 3 [IU] via SUBCUTANEOUS
  Administered 2013-09-11: 2 [IU] via SUBCUTANEOUS

## 2013-09-07 MED ORDER — ENOXAPARIN SODIUM 120 MG/0.8ML ~~LOC~~ SOLN
105.0000 mg | Freq: Two times a day (BID) | SUBCUTANEOUS | Status: DC
  Administered 2013-09-07 – 2013-09-08 (×4): 105 mg via SUBCUTANEOUS
  Filled 2013-09-07 (×7): qty 0.8

## 2013-09-07 MED ORDER — PRO-STAT SUGAR FREE PO LIQD
30.0000 mL | Freq: Every day | ORAL | Status: DC
  Administered 2013-09-07 – 2013-09-10 (×3): 30 mL via ORAL
  Filled 2013-09-07 (×6): qty 30

## 2013-09-07 MED ORDER — LINEZOLID 2 MG/ML IV SOLN
600.0000 mg | Freq: Two times a day (BID) | INTRAVENOUS | Status: DC
  Administered 2013-09-07 – 2013-09-09 (×5): 600 mg via INTRAVENOUS
  Filled 2013-09-07 (×6): qty 300

## 2013-09-07 NOTE — Progress Notes (Signed)
ANTICOAGULATION CONSULT NOTE - Initial Consult  Pharmacy Consult for heparin --> Eliquis--> lovenox Indication: pulmonary embolus    Allergies  Allergen Reactions  . Ancef [Cefazolin] Itching and Rash  . Sulfa Antibiotics Itching and Rash    Patient Measurements: Height: 5\' 10"  (177.8 cm) Weight: 231 lb 4.2 oz (104.9 kg) IBW/kg (Calculated) : 73   Vital Signs: Temp: 98.4 F (36.9 C) (04/02 1212) Temp src: Oral (04/02 1212) BP: 129/70 mmHg (04/02 1220) Pulse Rate: 94 (04/02 1220)  Labs:  Recent Labs  09/05/13 1510  09/06/13 0323 09/06/13 0925 09/07/13 0314  HGB  --   < > 9.5* 11.3* 9.2*  HCT  --   < > 31.4* 34.2* 28.1*  PLT  --   < > 165 203 172  LABPROT 13.8  --   --   --   --   INR 1.08  --   --   --   --   HEPARINUNFRC  --   --   --  0.46  --   CREATININE 1.38*  --  1.45*  1.45* 1.94* 2.27*  < > = values in this interval not displayed.  Estimated Creatinine Clearance: 37.8 ml/min (by C-G formula based on Cr of 2.27).   Medical History: Past Medical History  Diagnosis Date  . Diabetes mellitus   . Hypertension   . Hyperlipidemia   . CHF (congestive heart failure)   . Enlarged heart   . Pneumonia     hx of   . Osteomyelitis of foot     LT  . CKD (chronic kidney disease), stage IV    Assessment: Patient is a 68 y.o M with new PE.  To transition Eliquis to lovenox d/t pt's renal insufficiency. Last Eliquis dose was given to patient at 0600 this morning. Scr trending up -- 2.27 today (crcl~32-37).  No bleeding documented.   Goal of Therapy:  Anti-Xa level 0.6-1 units/ml 4hrs after LMWH dose given Monitor platelets by anticoagulation protocol: Yes   Plan:  1) Lovenox 105mg  SQ q12h -- first dose at 6 PM tonight 2) f/u renal function and adjust dose as needed 3) cont aztreonam 2gm q8h for now-- reduce to 1gm q8h when crcl<30  Cheyeanne Roadcap P 09/07/2013,1:47 PM

## 2013-09-07 NOTE — Progress Notes (Signed)
[place] TEAM 1 - Stepdown/ICU TEAM Progress Note  Justin HansenDonald Vanosdol Sr. [id number] DOB: 06/23/1945 DOA: 09/05/2013 PCP: Evlyn CourierHILL,[name], MD  Admit HPI / Brief Narrative: 68 y.o. male with a history of left below-the-knee amputation, poorly controlled type 2 diabetes mellitus, hypertension, currently residing at [facility], who was referred to the emergency department with complaints of fevers and chills. Patient reported a one-week history of cough, shortness of breath, with associated sputum characterized as yellow/green, associated with an overall functional decline. Patient over the preceding week had been increasingly lethargic, unable to participate with physical therapy, having minimal by mouth intake and increased confusion. Chest x-ray performed on admission showing no active disease. CBC showed a white count within normal limits at 7100. Given clinical suspicion for healthcare pneumonia patient was started on broad-spectrum IV antibiotic therapy with vancomycin and aztreonam in the emergency room.   HPI/Subjective: No complaints- noted to have a cough- states it has been there for 1 month.  Assessment/Plan:  [facility] associated pneumonia Continue empiric antibiotic therapy x 7 days minimum  AKI on Stage III chronic kidney disease Baseline creatinine reportedly 1.9 -- rising steadily- states he has poor PO intake but has about 1 L of urine output over past 24 hrs which is reasonably good. Check UA and recheck Bmet in AM.  -Cont IVF  - d/c Vanc and switch to Zyvox - d/w Dr Daiva EvesVan Dam  Small LUL region PE Full anticoagulation- not Eliquis and other NOACs not adequately studied in renal failure- in the case of eliquis this is Cr > 2.5 - as he is approaching this level, I will d/c Eliquis for now and place on Lovenox injections for now  7 mm ground-glass attenuation nodular opacity in the periphery of the lingula Followup imaging after antibiotic therapy will  be indicated  Acute toxic metabolic encephalopathy Improving with treatment of above - follow  Chronic diastolic congestive heart failure TTE 03/09/2013 showed an ejection fraction of 50-50%, grade 1 diastolic dysfunction - clinically compensated at present  Type 2 diabetes mellitus Follow CBG without change today  Hypertension Blood pressure well controlled at present time  Anemia of chronic disease Hemoglobin appears stable at this time  Status post below the knee amputation  Obesity - Body mass index is 33.18 kg/(m^2).  MRSA screen positive  Code Status: FULL Family Communication: no family present at time of exam Disposition Plan: SDU  Consultants: none  Procedures: none  Antibiotics: Aztreonam 3/31 >> Vanc 3/31 >>  DVT prophylaxis: IV heparin   Objective: Blood pressure 129/70, pulse 94, temperature 98.4 F (36.9 C), temperature source Oral, resp. rate 24, height 5\' 10"  (1.778 m), weight 104.9 kg (231 lb 4.2 oz), SpO2 93.00%.  Intake/Output Summary (Last 24 hours) at 09/07/13 1305 Last data filed at 09/07/13 1223  Gross per 24 hour  Intake  947.5 ml  Output    951 ml  Net   -3.5 ml   Exam: General: No acute respiratory distress while at rest Lungs: Mild bibasilar crackles right greater than left with no wheeze Cardiovascular: Tachycardic but regular without gallop rub or appreciable murmur Abdomen: Nontender, nondistended, soft, bowel sounds positive, no rebound, no ascites, no appreciable mass Extremities: No significant cyanosis, clubbing, or edema bilateral lower extremities - left BKA stump without acute findings  Data Reviewed: Basic Metabolic Panel:  Recent Labs Lab 09/05/13 1510 09/06/13 0323 09/06/13 0925 09/07/13 0314  NA 143 116* 142 145  K 5.2 3.7 5.0 4.3  CL 108  85* 106 109  CO2 22 18* 21 23  GLUCOSE 157* 1024* 150* 93  BUN 20 21 29* 37*  CREATININE 1.38* 1.45*  1.45* 1.94* 2.27*  CALCIUM 8.6 6.3* 8.2* 7.7*   Liver Function  Tests:  Recent Labs Lab 09/05/13 1510 09/06/13 0323 09/06/13 0925  AST 29 10 22   ALT <5 <5 <5  ALKPHOS 68 48 63  BILITOT 0.2* 0.3 0.3  PROT 6.5 4.7* 6.6  ALBUMIN 2.5* 1.8* 2.4*   CBC:  Recent Labs Lab 09/05/13 1550 09/06/13 0323 09/06/13 0925 09/07/13 0314  WBC 7.1 9.1 9.3 5.0  NEUTROABS 5.7  --   --   --   HGB 10.8* 9.5* 11.3* 9.2*  HCT 32.6* 31.4* 34.2* 28.1*  MCV 83.4 89.2 84.2 83.4  PLT 196 165 203 172   CBG:  Recent Labs Lab 09/06/13 0816 09/06/13 1214 09/06/13 1653 09/06/13 2214 09/07/13 0756  GLUCAP 163* 172* 133* 110* 104*    Recent Results (from the past 240 hour(s))  CULTURE, BLOOD (ROUTINE X 2)     Status: None   Collection Time    09/05/13  3:29 PM      Result Value Ref Range Status   Specimen Description BLOOD ARM LEFT   Final   Special Requests BOTTLES DRAWN AEROBIC AND ANAEROBIC 5CC   Final   Culture  Setup Time     Final   Value: 09/05/2013 20:00     Performed at [facility]   Culture     Final   Value:        BLOOD CULTURE RECEIVED NO GROWTH TO DATE CULTURE WILL BE HELD FOR 5 DAYS BEFORE ISSUING A FINAL NEGATIVE REPORT     Performed at [facility]   Report Status PENDING   Incomplete  CULTURE, BLOOD (ROUTINE X 2)     Status: None   Collection Time    09/05/13  3:34 PM      Result Value Ref Range Status   Specimen Description BLOOD LEFT FOREARM   Final   Special Requests BOTTLES DRAWN AEROBIC ONLY 10CC   Final   Culture  Setup Time     Final   Value: 09/05/2013 20:00     Performed at [facility]   Culture     Final   Value:        BLOOD CULTURE RECEIVED NO GROWTH TO DATE CULTURE WILL BE HELD FOR 5 DAYS BEFORE ISSUING A FINAL NEGATIVE REPORT     Performed at [facility]   Report Status PENDING   Incomplete  URINE CULTURE     Status: None   Collection Time    09/05/13  5:27 PM      Result Value Ref Range Status   Specimen Description URINE, RANDOM   Final   Special Requests NONE   Final    Culture  Setup Time     Final   Value: 09/05/2013 20:14     Performed at [facility] Count PENDING   Incomplete   Culture     Final   Value: Culture reincubated for better growth     Performed at [facility]   Report Status PENDING   Incomplete  MRSA PCR SCREENING     Status: Abnormal   Collection Time    09/05/13  8:59 PM      Result Value Ref Range Status   MRSA by PCR POSITIVE (*) NEGATIVE Final   Comment:  The GeneXpert MRSA Assay (FDA     approved for NASAL specimens     only), is one component of a     comprehensive MRSA colonization     surveillance program. It is not     intended to diagnose MRSA     infection nor to guide or     monitor treatment for     MRSA infections.     RESULT CALLED TO, READ BACK BY AND VERIFIED WITH:     T.THOMAS,RN 0145 09/06/13 M.CAMPBELL     Studies:  Recent x-ray studies have been reviewed in detail by the Attending Physician  Scheduled Meds:  Scheduled Meds: . amLODipine  5 mg Oral Daily  . apixaban  10 mg Oral Q12H   Followed by  . [START ON 09/13/2013] apixaban  5 mg Oral Q12H  . aztreonam  2 g Intravenous 3 times per day  . carvedilol  12.5 mg Oral BID WC  . Chlorhexidine Gluconate Cloth  6 each Topical Q0600  . cloNIDine  0.1 mg Oral Q72H  . feeding supplement (GLUCERNA SHAKE)  237 mL Oral Q1500  . insulin aspart  0-15 Units Subcutaneous TID WC  . insulin aspart  0-5 Units Subcutaneous QHS  . mupirocin ointment  1 application Nasal BID  . tamsulosin  0.4 mg Oral Daily    Time spent on care of this patient: 35 mins   [facility]  [facility] Office  [phone] Pager - Text Page per Loretha Stapler as per below:  On-Call/Text Page:      [email or web address]      password [email or web address]  If 7PM-7AM, please contact night-coverage [email or web address] Password [email or web address] 09/07/2013, 1:05 PM   LOS: 2 days

## 2013-09-07 NOTE — Progress Notes (Signed)
Pt refused to sit in the chair. Pt is also refusing to be repositioned in the bed. Will continue to encourage

## 2013-09-08 LAB — GLUCOSE, CAPILLARY
GLUCOSE-CAPILLARY: 110 mg/dL — AB (ref 70–99)
Glucose-Capillary: 104 mg/dL — ABNORMAL HIGH (ref 70–99)
Glucose-Capillary: 155 mg/dL — ABNORMAL HIGH (ref 70–99)
Glucose-Capillary: 136 mg/dL — ABNORMAL HIGH (ref 70–99)

## 2013-09-08 MED ORDER — AMLODIPINE BESYLATE 10 MG PO TABS
10.0000 mg | ORAL_TABLET | Freq: Every day | ORAL | Status: DC
  Administered 2013-09-09 – 2013-09-11 (×3): 10 mg via ORAL
  Filled 2013-09-08 (×3): qty 1

## 2013-09-08 MED ORDER — CLONIDINE HCL 0.1 MG PO TABS
0.1000 mg | ORAL_TABLET | Freq: Three times a day (TID) | ORAL | Status: DC
  Administered 2013-09-08 – 2013-09-11 (×10): 0.1 mg via ORAL
  Filled 2013-09-08 (×11): qty 1

## 2013-09-08 MED ORDER — CLONIDINE HCL 0.1 MG PO TABS: 0.1000 mg | ORAL_TABLET | Freq: Two times a day (BID) | ORAL | Status: DC

## 2013-09-08 NOTE — Progress Notes (Signed)
[place] TEAM 1 - Stepdown/ICU TEAM Progress Note  [name]. [id number] DOB: 11/06/1945 DOA: 09/05/2013 PCP: Evlyn CourierHILL,[name], MD  Admit HPI / Brief Narrative: 68 y.o. male with a history of left below-the-knee amputation, poorly controlled type 2 diabetes mellitus, hypertension, currently residing at [facility], who was referred to the emergency department with complaints of fevers and chills. Patient reported a one-week history of cough, shortness of breath, with associated sputum characterized as yellow/green, associated with an overall functional decline. Patient over the preceding week had been increasingly lethargic, unable to participate with physical therapy, having minimal by mouth intake and increased confusion. Chest x-ray performed on admission showing no active disease. CBC showed a white count within normal limits at 7100. Given clinical suspicion for healthcare pneumonia patient was started on broad-spectrum IV antibiotic therapy with vancomycin and aztreonam in the emergency room.   HPI/Subjective: States he feels much better today.  No specific complaints.    Assessment/Plan:  [facility] associated pneumonia Continue empiric antibiotic therapy x 7 days minimum  AKI on Stage III chronic kidney disease Baseline creatinine reportedly 1.9 - rising steadily - cont IVF - d/c'ed Vanc and switched to Zyvox - recheck BMET in AM   Small LUL region PE Full anticoagulation - Eliquis has been initiated, but pt was transitioned to lovenox due to rising crt - follow   +UA  On empiric abx for PNA - f/u culture and adjust as needed   7 mm ground-glass attenuation nodular opacity in the periphery of the lingula Followup imaging after antibiotic therapy will be indicated  Acute toxic metabolic encephalopathy Resolved - pt appears to be back to baseline   Chronic diastolic congestive heart failure TTE 03/09/2013 showed an ejection fraction of 50-50%,  grade 1 diastolic dysfunction - clinically compensated at present  Type 2 diabetes mellitus Follow CBG without change today  Hypertension Blood pressure now climbing - slow IVF - adjust medical tx   Anemia of chronic disease Hemoglobin appears stable at this time  Status post below the knee amputation  Obesity - Body mass index is 33.18 kg/(m^2).  MRSA screen positive  Code Status: FULL Family Communication: no family present at time of exam Disposition Plan: stable for transfer to medical bed - ultimate dispo is for d/c home w/ wife   Consultants: none  Procedures: none  Antibiotics: Aztreonam 3/31 >> Vanc 3/31 Linezolid 4/2 >>  DVT prophylaxis: IV heparin >> Eliquis >> lovenox  Objective: Blood pressure 119/88, pulse 84, temperature 98.5 F (36.9 C), temperature source Oral, resp. rate 19, height 5\' 10"  (1.778 m), weight 104.9 kg (231 lb 4.2 oz), SpO2 96.00%.  Intake/Output Summary (Last 24 hours) at 09/08/13 1102 Last data filed at 09/08/13 1010  Gross per 24 hour  Intake   3405 ml  Output   1201 ml  Net   2204 ml   Exam: General: No acute respiratory distress while at rest Lungs: Mild bibasilar crackles right greater than left with no wheeze Cardiovascular: Regular without gallop rub or appreciable murmur Abdomen: Nontender, nondistended, soft, bowel sounds positive, no rebound, no ascites, no appreciable mass Extremities: No significant cyanosis, clubbing, or edema bilateral lower extremities - left BKA stump without acute findings  Data Reviewed: Basic Metabolic Panel:  Recent Labs Lab 09/05/13 1510 09/06/13 0323 09/06/13 0925 09/07/13 0314  NA 143 116* 142 145  K 5.2 3.7 5.0 4.3  CL 108 85* 106 109  CO2 22 18* 21 23  GLUCOSE 157* 1024*  150* 93  BUN 20 21 29* 37*  CREATININE 1.38* 1.45*  1.45* 1.94* 2.27*  CALCIUM 8.6 6.3* 8.2* 7.7*   Liver Function Tests:  Recent Labs Lab 09/05/13 1510 09/06/13 0323 09/06/13 0925  AST 29 10 22     ALT <5 <5 <5  ALKPHOS 68 48 63  BILITOT 0.2* 0.3 0.3  PROT 6.5 4.7* 6.6  ALBUMIN 2.5* 1.8* 2.4*   CBC:  Recent Labs Lab 09/05/13 1550 09/06/13 0323 09/06/13 0925 09/07/13 0314  WBC 7.1 9.1 9.3 5.0  NEUTROABS 5.7  --   --   --   HGB 10.8* 9.5* 11.3* 9.2*  HCT 32.6* 31.4* 34.2* 28.1*  MCV 83.4 89.2 84.2 83.4  PLT 196 165 203 172   CBG:  Recent Labs Lab 09/07/13 0756 09/07/13 1215 09/07/13 1726 09/07/13 2112 09/08/13 0823  GLUCAP 104* 123* 125* 146* 104*    Recent Results (from the past 240 hour(s))  CULTURE, BLOOD (ROUTINE X 2)     Status: None   Collection Time    09/05/13  3:29 PM      Result Value Ref Range Status   Specimen Description BLOOD ARM LEFT   Final   Special Requests BOTTLES DRAWN AEROBIC AND ANAEROBIC 5CC   Final   Culture  Setup Time     Final   Value: 09/05/2013 20:00     Performed at [facility]   Culture     Final   Value:        BLOOD CULTURE RECEIVED NO GROWTH TO DATE CULTURE WILL BE HELD FOR 5 DAYS BEFORE ISSUING A FINAL NEGATIVE REPORT     Performed at [facility]   Report Status PENDING   Incomplete  CULTURE, BLOOD (ROUTINE X 2)     Status: None   Collection Time    09/05/13  3:34 PM      Result Value Ref Range Status   Specimen Description BLOOD LEFT FOREARM   Final   Special Requests BOTTLES DRAWN AEROBIC ONLY 10CC   Final   Culture  Setup Time     Final   Value: 09/05/2013 20:00     Performed at [facility]   Culture     Final   Value:        BLOOD CULTURE RECEIVED NO GROWTH TO DATE CULTURE WILL BE HELD FOR 5 DAYS BEFORE ISSUING A FINAL NEGATIVE REPORT     Performed at [facility]   Report Status PENDING   Incomplete  URINE CULTURE     Status: None   Collection Time    09/05/13  5:27 PM      Result Value Ref Range Status   Specimen Description URINE, RANDOM   Final   Special Requests NONE   Final   Culture  Setup Time     Final   Value: 09/05/2013 20:14     Performed at [facility] Count PENDING   Incomplete   Culture     Final   Value: Culture reincubated for better growth     Performed at [facility]   Report Status PENDING   Incomplete  MRSA PCR SCREENING     Status: Abnormal   Collection Time    09/05/13  8:59 PM      Result Value Ref Range Status   MRSA by PCR POSITIVE (*) NEGATIVE Final   Comment:            The  GeneXpert MRSA Assay (FDA     approved for NASAL specimens     only), is one component of a     comprehensive MRSA colonization     surveillance program. It is not     intended to diagnose MRSA     infection nor to guide or     monitor treatment for     MRSA infections.     RESULT CALLED TO, READ BACK BY AND VERIFIED WITH:     T.THOMAS,RN 0145 09/06/13 M.CAMPBELL     Studies:  Recent x-ray studies have been reviewed in detail by the Attending Physician  Scheduled Meds:  Scheduled Meds: . amLODipine  5 mg Oral Daily  . aztreonam  2 g Intravenous 3 times per day  . carvedilol  12.5 mg Oral BID WC  . Chlorhexidine Gluconate Cloth  6 each Topical Q0600  . cloNIDine  0.1 mg Oral Q72H  . enoxaparin (LOVENOX) injection  105 mg Subcutaneous Q12H  . feeding supplement (GLUCERNA SHAKE)  237 mL Oral Q1500  . feeding supplement (PRO-STAT SUGAR FREE 64)  30 mL Oral Daily  . insulin aspart  0-15 Units Subcutaneous TID WC  . insulin aspart  0-5 Units Subcutaneous QHS  . linezolid  600 mg Intravenous Q12H  . mupirocin ointment  1 application Nasal BID  . tamsulosin  0.4 mg Oral Daily    Time spent on care of this patient: 35 mins   [facility]  [facility] Office  [phone] Pager - Text Page per Loretha Stapler as per below:  On-Call/Text Page:      [email or web address]      password [email or web address]  If 7PM-7AM, please contact night-coverage [email or web address] Password [email or web address] 09/08/2013, 11:02 AM   LOS: 3 days

## 2013-09-08 NOTE — Progress Notes (Signed)
Clinical Social Work Department CLINICAL SOCIAL WORK PLACEMENT NOTE 09/08/2013  Patient:  [name],[name]  Account Number:  [id number] Admit date:  09/05/2013  Clinical Social Worker:  Maryclare LabradorJULIE Frenchie Pribyl, Theresia MajorsLCSWA  Date/time:  09/08/2013 03:16 PM  Clinical Social Work is seeking post-discharge placement for this patient at the following level of care:   SKILLED NURSING   (*CSW will update this form in Epic as items are completed)   N/A-clinicals sent to [facility] with a note requesting private room, at family request  Patient/family provided with [facility] Department of Clinical Social Work's list of facilities offering this level of care within the geographic area requested by the patient (or if unable, by the patient's family).  09/08/2013  Patient/family informed of their freedom to choose among providers that offer the needed level of care, that participate in Medicare, Medicaid or managed care program needed by the patient, have an available bed and are willing to accept the patient.  N/A-clinicals not sent to this SNF  Patient/family informed of [facility]' ownership interest in [facility], as well as of the fact that they are under no obligation to receive care at this facility.  PASARR submitted to EDS on EXISTING PASARR number received from EDS on EXISTING  FL2 transmitted to all facilities in geographic area requested by pt/family on  09/08/2013 FL2 transmitted to all facilities within larger geographic area on   Patient informed that his/her managed care company has contracts with or will negotiate with  certain facilities, including the following:     Patient/family informed of bed offers received:   Patient chooses bed at  Physician recommends and patient chooses bed at    Patient to be transferred to  on   Patient to be transferred to facility by   The following physician request were entered in Epic:   Additional Comments:   Maryclare LabradorJulie Creta Dorame, MSW,  [facility] Clinical Social Worker [phone]

## 2013-09-08 NOTE — Progress Notes (Signed)
NURSING PROGRESS NOTE  Justin HansenDonald Kenney Sr. [id number] Transfer Data: 09/08/2013 4:21 PM Attending Provider: Lonia BloodJeffrey T McClung, MD OZD:GUYQ,IHKVQQPCP:[name],[name], MD Code Status: Full  Justin HansenDonald Templer Sr. is a 68 y.o. male patient transferred from 2 central -No acute distress noted.  -No complaints of shortness of breath.  -No complaints of chest pain.     Blood pressure 188/95, pulse 98, temperature 98 F (36.7 C), temperature source Oral, resp. rate 20, height 5\' 10"  (1.778 m), weight 106.777 kg (235 lb 6.4 oz), SpO2 96.00%.   IV Fluids:  IV in place, occlusive dsg intact without redness, IV cath antecubital left, condition patent and no redness normal saline.   Allergies:  Ancef and Sulfa antibiotics  Past Medical History:   has a past medical history of Diabetes mellitus; Hypertension; Hyperlipidemia; CHF (congestive heart failure); Enlarged heart; Pneumonia; Osteomyelitis of foot; and CKD (chronic kidney disease), stage IV.  Past Surgical History:   has past surgical history that includes right leg fracture; Skin graft; Knee surgery; TEE without cardioversion (03/17/2012); Incision and drainage (Left, 07/02/2013); and Amputation (Left, 07/18/2013).  Social History:   reports that he quit smoking about 29 years ago. He has never used smokeless tobacco. He reports that he does not drink alcohol or use illicit drugs.  Skin: Intact  Patient/Family orientated to room. Information packet given to patient/family. Admission inpatient armband information verified with patient/family to include name and date of birth and placed on patient arm. Side rails up x 2, fall assessment and education completed with patient/family. Patient/family able to verbalize understanding of risk associated with falls and verbalized understanding to call for assistance before getting out of bed. Call light within reach. Patient/family able to voice and demonstrate understanding of unit orientation instructions.    Will  continue to evaluate and treat per MD orders.

## 2013-09-08 NOTE — Progress Notes (Signed)
Nutrition Brief Note  RD drawn to chart secondary to current prescription of Zyvox, a medication with potential interactions with high tyramine-containing foods.   Hospitalized diet is not high in tyramine and poses no risk for patient at this time.   If diet education warranted, please consult RD.  Maureen ChattersKatie Teiana Hajduk, RD, LDN Pager #: [phone] After-Hours Pager #: [phone]

## 2013-09-08 NOTE — Care Management Note (Signed)
    Page 1 of 1   09/08/2013     2:57:59 PM   CARE MANAGEMENT NOTE 09/08/2013  Patient:  [name],[name]   Account Number:  [id number]  Date Initiated:  09/06/2013  Documentation initiated by:  [name]  Subjective/Objective Assessment:   dx PNA, PE  [facility]    PCP  Dr Mirna MiresGerald [name]     In-house referral  Clinical Social Worker      DC Planning Services  CM consult      Choice offered to / List presented to:  C-3 Spouse   DME arranged  SHOWER STOOL  WHEELCHAIR - MANUAL  OTHER - SEE COMMENT      DME agency  [facility]    HH arranged  HH-1 RN  HH-2 PT      [facility]   Per UR Regulation:  Reviewed for med. necessity/level of care/duration of stay  Comments:  09/08/13 1451 Justin Turgeon RN MSN BSN CCM Pt does not want to return to SNF and spouse has decided to take him home with home health following.  Spouse chose [facility], referral called to liaison.  Pt has rolling walker and BSC, did have borrowed w/c but returned it to owner.  W/C with cushion and leg rest and shower chair ordered for home use.

## 2013-09-08 NOTE — Progress Notes (Signed)
Clinical Social Work Department BRIEF PSYCHOSOCIAL ASSESSMENT 09/08/2013  Patient:  [name],[name]     Account Number:  [id number]     Admit date:  09/05/2013  Clinical Social Worker:  Varney BilesANDERSON,Ashana Tullo, LCSWA  Date/Time:  09/08/2013 03:12 PM  Referred by:  Physician  Date Referred:  09/08/2013 Referred for  SNF Placement   Other Referral:   Interview type:  Patient Other interview type:   Also spoke with pt's wife at bedside.    PSYCHOSOCIAL DATA Living Status:  FACILITY [facility] Level of care:  Skilled Nursing Facility Primary support name:  [name] [phone]) Primary support relationship to patient:  SPOUSE Degree of support available:   Good--pt's wife provides ample support for pt. [facility].    CURRENT CONCERNS Current Concerns  Post-Acute Placement   Other Concerns:    SOCIAL WORK ASSESSMENT / PLAN CSW introduced self and explained role in care plan. [facility], but [facility] do not want him to return. [facility] because it is conveniently located to family home and they had a private room available. Wife states she does not want him to return. CSW explained other options for placement and that CSW could make referrals, and also introduced potential for wife to speak with RNCM for home health information. Wife states she is interested in home health and asked CSW to make referral to [facility]. If [facility] has a private room, she would consider it, but wife is also interested in learning more about home health. RNCM informed and will follow up concerning home health discussion.   Assessment/plan status:  Psychosocial Support/Ongoing Assessment of Needs Other assessment/ plan:   Information/referral to community resources:   ?SNF ?Home health    PATIENT'S/FAMILY'S  RESPONSE TO PLAN OF CARE: Good--CSW provided active listening and support as wife described grievances with [facility] and pt's experience in and out of a few SNFs. Wife states she believes pt would get the best care with home health, but would like a CNA/aide to assist with bathing pt. RNCM informed and will follow up.       Maryclare LabradorJulie Subhan Hoopes, MSW, [facility] Clinical Social Worker [phone]

## 2013-09-08 NOTE — Progress Notes (Signed)
MD paged about patient's blood pressure. No response. Night shift notified. Pt. Received catapres and coreg prior to recheck of blood pressure.

## 2013-09-09 DIAGNOSIS — R609 Edema, unspecified: Secondary | ICD-10-CM

## 2013-09-09 DIAGNOSIS — R4182 Altered mental status, unspecified: Secondary | ICD-10-CM

## 2013-09-09 LAB — BASIC METABOLIC PANEL
BUN: 28 mg/dL — ABNORMAL HIGH (ref 6–23)
CO2: 19 meq/L (ref 19–32)
Calcium: 7.9 mg/dL — ABNORMAL LOW (ref 8.4–10.5)
Chloride: 111 mEq/L (ref 96–112)
Creatinine, Ser: 1.57 mg/dL — ABNORMAL HIGH (ref 0.50–1.35)
GFR calc Af Amer: 51 mL/min — ABNORMAL LOW (ref >90)
GFR calc non Af Amer: 44 mL/min — ABNORMAL LOW (ref >90)
GLUCOSE: 106 mg/dL — AB (ref 70–99)
POTASSIUM: 3.8 meq/L (ref 3.7–5.3)
SODIUM: 145 meq/L (ref 137–147)

## 2013-09-09 LAB — GLUCOSE, CAPILLARY
GLUCOSE-CAPILLARY: 103 mg/dL — AB (ref 70–99)
Glucose-Capillary: 112 mg/dL — ABNORMAL HIGH (ref 70–99)
Glucose-Capillary: 114 mg/dL — ABNORMAL HIGH (ref 70–99)
Glucose-Capillary: 112 mg/dL — ABNORMAL HIGH (ref 70–99)

## 2013-09-09 MED ORDER — APIXABAN 5 MG PO TABS: 5.0000 mg | ORAL_TABLET | Freq: Two times a day (BID) | ORAL | Status: DC

## 2013-09-09 MED ORDER — APIXABAN 5 MG PO TABS
10.0000 mg | ORAL_TABLET | Freq: Once | ORAL | Status: AC
  Administered 2013-09-09: 10 mg via ORAL
  Filled 2013-09-09: qty 2

## 2013-09-09 MED ORDER — APIXABAN 5 MG PO TABS
10.0000 mg | ORAL_TABLET | Freq: Two times a day (BID) | ORAL | Status: DC
  Administered 2013-09-09 – 2013-09-11 (×4): 10 mg via ORAL
  Filled 2013-09-09 (×5): qty 2

## 2013-09-09 MED ORDER — FUROSEMIDE 10 MG/ML IJ SOLN: 40.0000 mg | Freq: Once | INTRAMUSCULAR | Status: DC

## 2013-09-09 MED ORDER — FUROSEMIDE 80 MG PO TABS
80.0000 mg | ORAL_TABLET | Freq: Once | ORAL | Status: AC
  Administered 2013-09-09: 80 mg via ORAL
  Filled 2013-09-09: qty 1

## 2013-09-09 MED ORDER — LINEZOLID 600 MG PO TABS
600.0000 mg | ORAL_TABLET | Freq: Two times a day (BID) | ORAL | Status: DC
  Administered 2013-09-09 – 2013-09-11 (×4): 600 mg via ORAL
  Filled 2013-09-09 (×6): qty 1

## 2013-09-09 NOTE — Progress Notes (Signed)
Progress Note  Toma Arts Sr. ZOX:[id number] DOB: 02/26/1946 DOA: 09/05/2013 PCP: [name], MD  Admit HPI / Brief Narrative: 67 y.o. male with a history of left below-the-knee amputation, poorly controlled type 2 diabetes mellitus, hypertension, currently residing at [facility], who was referred to the emergency department with complaints of fevers and chills. Patient reported a one-week history of cough, shortness of breath, with associated sputum characterized as yellow/green, associated with an overall functional decline. Patient over the preceding week had been increasingly lethargic, unable to participate with physical therapy, having minimal by mouth intake and increased confusion. Chest x-ray performed on admission showing no active disease. CBC showed a white count within normal limits at 7100. Given clinical suspicion for healthcare pneumonia patient was started on broad-spectrum IV antibiotic therapy with vancomycin and aztreonam in the emergency room.   HPI/Subjective: No complaints, denies any shortness of breath or fever/chills.  Assessment/Plan:  [facility] associated pneumonia -Continue empiric antibiotic therapy x 7 days minimum. -Currently on Zyvox.  AKI on Stage III chronic kidney disease -Baseline creatinine reportedly 1.9 - rising steadily. -Patient started on IV fluids, vancomycin discontinued. -Creatinine today is 1.57, patient has evidence of fluid overload with lower extremity edema, discontinue IV fluids.  Small LUL region PE Full anticoagulation - Eliquis has been initiated, but pt was transitioned to lovenox due to rising crt - follow   +UA  On empiric abx for PNA, urine culture showed yeast. I will add fluconazole  7 mm ground-glass attenuation nodular opacity in the periphery of the lingula Followup imaging after antibiotic therapy will be indicated  Acute toxic metabolic encephalopathy Resolved - pt appears to be back  to baseline   Chronic diastolic congestive heart failure TTE 03/09/2013 showed an ejection fraction of 50-50%, grade 1 diastolic dysfunction - clinically compensated at present  Type 2 diabetes mellitus Follow CBG without change today  Hypertension Blood pressure now climbing - slow IVF - adjust medical tx   Anemia of chronic disease Hemoglobin appears stable at this time  Status post below the knee amputation  Obesity - Body mass index is 33.78 kg/(m^2).  MRSA screen positive  Code Status: FULL Family Communication: no family present at time of exam Disposition Plan: stable for transfer to medical bed - ultimate dispo is for d/c home w/ wife   Consultants: none  Procedures: none  Antibiotics: Aztreonam 3/31 >> Vanc 3/31 Linezolid 4/2 >>  DVT prophylaxis: IV heparin >> Eliquis >> lovenox  Objective: Blood pressure 179/92, pulse 94, temperature 97.7 F (36.5 C), temperature source Oral, resp. rate 18, height 5\' 10"  (1.778 m), weight 106.777 kg (235 lb 6.4 oz), SpO2 92.00%.  Intake/Output Summary (Last 24 hours) at 09/09/13 1147 Last data filed at 09/08/13 2129  Gross per 24 hour  Intake 1159.58 ml  Output    500 ml  Net 659.58 ml   Exam: General: No acute respiratory distress while at rest Lungs: Mild bibasilar crackles right greater than left with no wheeze Cardiovascular: Regular without gallop rub or appreciable murmur Abdomen: Nontender, nondistended, soft, bowel sounds positive, no rebound, no ascites, no appreciable mass Extremities: No significant cyanosis, clubbing, or edema bilateral lower extremities - left BKA stump without acute findings  Data Reviewed: Basic Metabolic Panel:  Recent Labs Lab 09/05/13 1510 09/06/13 0323 09/06/13 0925 09/07/13 0314 09/09/13 0640  NA 143 116* 142 145 145  K 5.2 3.7 5.0 4.3 3.8  CL 108 85* 106 109 111  CO2 22 18* 21 23  19  GLUCOSE 157* 1024* 150* 93 106*  BUN 20 21 29* 37* 28*  CREATININE 1.38* 1.45*   1.45* 1.94* 2.27* 1.57*  CALCIUM 8.6 6.3* 8.2* 7.7* 7.9*   Liver Function Tests:  Recent Labs Lab 09/05/13 1510 09/06/13 0323 09/06/13 0925  AST 29 10 22   ALT <5 <5 <5  ALKPHOS 68 48 63  BILITOT 0.2* 0.3 0.3  PROT 6.5 4.7* 6.6  ALBUMIN 2.5* 1.8* 2.4*   CBC:  Recent Labs Lab 09/05/13 1550 09/06/13 0323 09/06/13 0925 09/07/13 0314  WBC 7.1 9.1 9.3 5.0  NEUTROABS 5.7  --   --   --   HGB 10.8* 9.5* 11.3* 9.2*  HCT 32.6* 31.4* 34.2* 28.1*  MCV 83.4 89.2 84.2 83.4  PLT 196 165 203 172   CBG:  Recent Labs Lab 09/08/13 0823 09/08/13 1221 09/08/13 1703 09/08/13 2106 09/09/13 0753  GLUCAP 104* 155* 136* 110* 103*    Recent Results (from the past 240 hour(s))  CULTURE, BLOOD (ROUTINE X 2)     Status: None   Collection Time    09/05/13  3:29 PM      Result Value Ref Range Status   Specimen Description BLOOD ARM LEFT   Final   Special Requests BOTTLES DRAWN AEROBIC AND ANAEROBIC 5CC   Final   Culture  Setup Time     Final   Value: 09/05/2013 20:00     Performed at [facility]   Culture     Final   Value:        BLOOD CULTURE RECEIVED NO GROWTH TO DATE CULTURE WILL BE HELD FOR 5 DAYS BEFORE ISSUING A FINAL NEGATIVE REPORT     Performed at [facility]   Report Status PENDING   Incomplete  CULTURE, BLOOD (ROUTINE X 2)     Status: None   Collection Time    09/05/13  3:34 PM      Result Value Ref Range Status   Specimen Description BLOOD LEFT FOREARM   Final   Special Requests BOTTLES DRAWN AEROBIC ONLY 10CC   Final   Culture  Setup Time     Final   Value: 09/05/2013 20:00     Performed at [facility]   Culture     Final   Value:        BLOOD CULTURE RECEIVED NO GROWTH TO DATE CULTURE WILL BE HELD FOR 5 DAYS BEFORE ISSUING A FINAL NEGATIVE REPORT     Performed at [facility]   Report Status PENDING   Incomplete  URINE CULTURE     Status: None   Collection Time    09/05/13  5:27 PM      Result Value Ref Range Status    Specimen Description URINE, RANDOM   Final   Special Requests NONE   Final   Culture  Setup Time     Final   Value: 09/05/2013 20:14     Performed at [facility] Count     Final   Value: >=100,000 COLONIES/ML     Performed at [facility]   Culture     Final   Value: YEAST     Performed at [facility]   Report Status 09/09/2013 FINAL   Final  MRSA PCR SCREENING     Status: Abnormal   Collection Time    09/05/13  8:59 PM      Result Value Ref Range Status   MRSA by  PCR POSITIVE (*) NEGATIVE Final   Comment:            The GeneXpert MRSA Assay (FDA     approved for NASAL specimens     only), is one component of a     comprehensive MRSA colonization     surveillance program. It is not     intended to diagnose MRSA     infection nor to guide or     monitor treatment for     MRSA infections.     RESULT CALLED TO, READ BACK BY AND VERIFIED WITH:     T.THOMAS,RN 0145 09/06/13 M.CAMPBELL     Studies:  Recent x-ray studies have been reviewed in detail by the Attending Physician  Scheduled Meds:  Scheduled Meds: . amLODipine  10 mg Oral Daily  . aztreonam  2 g Intravenous 3 times per day  . carvedilol  12.5 mg Oral BID WC  . Chlorhexidine Gluconate Cloth  6 each Topical Q0600  . cloNIDine  0.1 mg Oral TID  . enoxaparin (LOVENOX) injection  105 mg Subcutaneous Q12H  . feeding supplement (GLUCERNA SHAKE)  237 mL Oral Q1500  . feeding supplement (PRO-STAT SUGAR FREE 64)  30 mL Oral Daily  . insulin aspart  0-15 Units Subcutaneous TID WC  . insulin aspart  0-5 Units Subcutaneous QHS  . linezolid  600 mg Oral Q12H  . mupirocin ointment  1 application Nasal BID  . tamsulosin  0.4 mg Oral Daily    Time spent on care of this patient: 35 mins   [facility] IncELMAHI,Shaylon Aden A  [facility] Office  [phone] Pager - Text Page per Loretha StaplerAmion as per below:  On-Call/Text Page:      [email or web address]      password [email or web address]  If 7PM-7AM, please contact  night-coverage [email or web address] Password [email or web address] 09/09/2013, 11:47 AM   LOS: 4 days

## 2013-09-09 NOTE — Progress Notes (Signed)
PHARMACIST - PHYSICIAN COMMUNICATION DR:   Arthor CaptainElmahi CONCERNING: Antibiotic IV to Oral Route Change Policy  RECOMMENDATION: This patient is receiving Linezolid by the intravenous route.  Based on criteria approved by the Pharmacy and Therapeutics Committee, the antibiotic(s) is/are being converted to the equivalent oral dose form(s).   DESCRIPTION: These criteria include:  Patient being treated for a respiratory tract infection, urinary tract infection, cellulitis or clostridium difficile associated diarrhea if on metronidazole  The patient is not neutropenic and does not exhibit a GI malabsorption state  The patient is eating (either orally or via tube) and/or has been taking other orally administered medications for a least 24 hours  The patient is improving clinically and has a Tmax < 100.5  If you have questions about this conversion, please contact the Pharmacy Department  []   [phone]( [phone] )  [facility] [x]   [phone]( [phone] )  [facility]  []   [phone]( [phone] )  [facility] []   [phone]( [phone] )  [facility]   Celedonio MiyamotoJeremy Nocholas Damaso, PharmD, BCPS Clinical Pharmacist Pager [phone]

## 2013-09-09 NOTE — Progress Notes (Signed)
Patients IV infiltrated. IV team could not get an IV in pt. Pt refused PICC line. MD aware.

## 2013-09-09 NOTE — Progress Notes (Signed)
ANTICOAGULATION CONSULT NOTE - Initial Consult  Pharmacy Consult for heparin --> Eliquis--> lovenox --> Eliquis Indication: pulmonary embolus    Allergies  Allergen Reactions  . Ancef [Cefazolin] Itching and Rash  . Sulfa Antibiotics Itching and Rash    Patient Measurements: Height: 5\' 10"  (177.8 cm) Weight: 235 lb 6.4 oz (106.777 kg) IBW/kg (Calculated) : 73   Vital Signs: Temp: 97.7 F (36.5 C) (04/04 0705) Temp src: Oral (04/04 0705) BP: 179/92 mmHg (04/04 0951) Pulse Rate: 94 (04/04 0705)  Labs:  Recent Labs  09/07/13 0314 09/09/13 0640  HGB 9.2*  --   HCT 28.1*  --   PLT 172  --   CREATININE 2.27* 1.57*    Estimated Creatinine Clearance: 55.1 ml/min (by C-G formula based on Cr of 1.57).   Medical History: Past Medical History  Diagnosis Date  . Diabetes mellitus   . Hypertension   . Hyperlipidemia   . CHF (congestive heart failure)   . Enlarged heart   . Pneumonia     hx of   . Osteomyelitis of foot     LT  . CKD (chronic kidney disease), stage IV    Assessment: Patient is a 68 y.o M with new PE.  To transition lovenox to Eliquis. Last Lovenox dose was given to patient yesterday evening (today's morning dose refused). Scr trending down-- 1.57 today (crcl~50).  No bleeding documented.   Goal of Therapy:  Therapeutic anticoagulation   Plan:  1) Eliquis 10mg  twice daily through 09/12/13, then 5mg  po twice daily thereafter.   2) Discontinue Lovenox  Mickeal SkinnerFrens, Kasidy Gianino John 09/09/2013,12:02 PM

## 2013-09-10 DIAGNOSIS — E1149 Type 2 diabetes mellitus with other diabetic neurological complication: Secondary | ICD-10-CM

## 2013-09-10 DIAGNOSIS — E1142 Type 2 diabetes mellitus with diabetic polyneuropathy: Secondary | ICD-10-CM

## 2013-09-10 DIAGNOSIS — I10 Essential (primary) hypertension: Secondary | ICD-10-CM

## 2013-09-10 LAB — BASIC METABOLIC PANEL
BUN: 23 mg/dL (ref 6–23)
CHLORIDE: 112 meq/L (ref 96–112)
CO2: 20 mEq/L (ref 19–32)
Calcium: 7.7 mg/dL — ABNORMAL LOW (ref 8.4–10.5)
Creatinine, Ser: 1.48 mg/dL — ABNORMAL HIGH (ref 0.50–1.35)
GFR calc Af Amer: 54 mL/min — ABNORMAL LOW (ref >90)
GFR calc non Af Amer: 47 mL/min — ABNORMAL LOW (ref >90)
Glucose, Bld: 101 mg/dL — ABNORMAL HIGH (ref 70–99)
POTASSIUM: 3.9 meq/L (ref 3.7–5.3)
SODIUM: 145 meq/L (ref 137–147)

## 2013-09-10 LAB — GLUCOSE, CAPILLARY
GLUCOSE-CAPILLARY: 95 mg/dL (ref 70–99)
GLUCOSE-CAPILLARY: 130 mg/dL — AB (ref 70–99)
GLUCOSE-CAPILLARY: 113 mg/dL — AB (ref 70–99)
Glucose-Capillary: 134 mg/dL — ABNORMAL HIGH (ref 70–99)

## 2013-09-10 LAB — CBC
HEMATOCRIT: 29.7 % — AB (ref 39.0–52.0)
HEMOGLOBIN: 9.9 g/dL — AB (ref 13.0–17.0)
MCH: 27.4 pg (ref 26.0–34.0)
MCHC: 33.3 g/dL (ref 30.0–36.0)
MCV: 82.3 fL (ref 78.0–100.0)
PLATELETS: 170 10*3/uL (ref 150–400)
RBC: 3.61 MIL/uL — ABNORMAL LOW (ref 4.22–5.81)
RDW: 14.5 % (ref 11.5–15.5)
WBC: 4.3 10*3/uL (ref 4.0–10.5)

## 2013-09-10 LAB — URINALYSIS, ROUTINE W REFLEX MICROSCOPIC
Bilirubin Urine: NEGATIVE
Glucose, UA: NEGATIVE mg/dL
Ketones, ur: NEGATIVE mg/dL
NITRITE: NEGATIVE
Protein, ur: NEGATIVE mg/dL
Specific Gravity, Urine: 1.016 (ref 1.005–1.030)
Urobilinogen, UA: 0.2 mg/dL (ref 0.0–1.0)
pH: 5 (ref 5.0–8.0)

## 2013-09-10 LAB — URINE MICROSCOPIC-ADD ON

## 2013-09-10 MED ORDER — FUROSEMIDE 10 MG/ML IJ SOLN
40.0000 mg | Freq: Once | INTRAMUSCULAR | Status: AC
  Administered 2013-09-10: 40 mg via INTRAVENOUS
  Filled 2013-09-10: qty 4

## 2013-09-10 MED ORDER — FLUCONAZOLE 150 MG PO TABS
150.0000 mg | ORAL_TABLET | Freq: Every day | ORAL | Status: DC
  Administered 2013-09-10 – 2013-09-11 (×2): 150 mg via ORAL
  Filled 2013-09-10 (×2): qty 1

## 2013-09-10 NOTE — Progress Notes (Signed)
RN helped to give patient CHG bath this am and noticed pt has skin tears on the back of both thighs. Pt states that he has been scratching those areas as they've been itching. Areas were cleansed and barrier cream applied.

## 2013-09-10 NOTE — Progress Notes (Signed)
Progress Note  [name] Sr. ZOX:[id number] DOB: [birth year] DOA: 09/05/2013 PCP: Justin Courier, MD  Admit HPI / Brief Narrative: 68 y.o. male with a history of left below-the-knee amputation, poorly controlled type 2 diabetes mellitus, hypertension, currently residing at [name], who was referred to the emergency department with complaints of fevers and chills. Patient reported a one-week history of cough, shortness of breath, with associated sputum characterized as yellow/green, associated with an overall functional decline. Patient over the preceding week had been increasingly lethargic, unable to participate with physical therapy, having minimal by mouth intake and increased confusion. Chest x-ray performed on admission showing no active disease. CBC showed a white count within normal limits at 7100. Given clinical suspicion for healthcare pneumonia patient was started on broad-spectrum IV antibiotic therapy with vancomycin and aztreonam in the emergency room.   HPI/Subjective: IV line was lost yesterday, patient did not want to be stuck again or to have PIC line. Received oral Zyvox but not IV aztreonam, supposed to have IV Lasix switched to oral because of lack of IV access.  Assessment/Plan:  [facility] associated pneumonia -Continue empiric antibiotic therapy x 7 days minimum. -Currently on Zyvox.  AKI on Stage III chronic kidney disease -Baseline creatinine reportedly 1.9 - rising steadily. -Patient started on IV fluids, vancomycin discontinued. -Creatinine today is 1.48, patient has evidence of fluid overload with lower extremity edema, discontinue IV fluids.  Small LUL region PE Full anticoagulation - Eliquis has been initiated, but pt was transitioned to lovenox due to rising crt . Restart Eliquis  +UA  On empiric abx for PNA, urine culture showed yeast. I will add fluconazole  7 mm ground-glass attenuation nodular opacity in the periphery  of the lingula Followup imaging after antibiotic therapy will be indicated  Acute toxic metabolic encephalopathy Resolved - pt appears to be back to baseline   Chronic diastolic congestive heart failure TTE 03/09/2013 showed an ejection fraction of 50-50%, grade 1 diastolic dysfunction - clinically compensated at present  Type 2 diabetes mellitus Follow CBG without change today  Hypertension Blood pressure now climbing - slow IVF - adjust medical tx   Anemia of chronic disease Hemoglobin appears stable at this time  Status post below the knee amputation  Obesity - Body mass index is 33.78 kg/(m^2).  MRSA screen positive  Code Status: FULL Family Communication: no family present at time of exam Disposition Plan: stable for transfer to medical bed - ultimate dispo is for d/c home w/ wife   Consultants: none  Procedures: none  Antibiotics: Aztreonam 3/31 >> Vanc 3/31 Linezolid 4/2 >>  DVT prophylaxis: IV heparin >> Eliquis >> lovenox  Objective: Blood pressure 146/75, pulse 67, temperature 97.9 F (36.6 C), temperature source Oral, resp. rate 16, height 5\' 10"  (1.778 m), weight 106.777 kg (235 lb 6.4 oz), SpO2 93.00%.  Intake/Output Summary (Last 24 hours) at 09/10/13 1017 Last data filed at 09/10/13 0658  Gross per 24 hour  Intake      0 ml  Output   1300 ml  Net  -1300 ml   Exam: General: No acute respiratory distress while at rest Lungs: Mild bibasilar crackles right greater than left with no wheeze Cardiovascular: Regular without gallop rub or appreciable murmur Abdomen: Nontender, nondistended, soft, bowel sounds positive, no rebound, no ascites, no appreciable mass Extremities: No significant cyanosis, clubbing, or edema bilateral lower extremities - left BKA stump without acute findings  Data Reviewed: Basic Metabolic Panel:  Recent Labs Lab 09/06/13 0323  09/06/13 0925 09/07/13 0314 09/09/13 0640 09/10/13 0518  NA 116* 142 145 145 145  K 3.7  5.0 4.3 3.8 3.9  CL 85* 106 109 111 112  CO2 18* 21 23 19 20   GLUCOSE 1024* 150* 93 106* 101*  BUN 21 29* 37* 28* 23  CREATININE 1.45*  1.45* 1.94* 2.27* 1.57* 1.48*  CALCIUM 6.3* 8.2* 7.7* 7.9* 7.7*   Liver Function Tests:  Recent Labs Lab 09/05/13 1510 09/06/13 0323 09/06/13 0925  AST 29 10 22   ALT <5 <5 <5  ALKPHOS 68 48 63  BILITOT 0.2* 0.3 0.3  PROT 6.5 4.7* 6.6  ALBUMIN 2.5* 1.8* 2.4*   CBC:  Recent Labs Lab 09/05/13 1550 09/06/13 0323 09/06/13 0925 09/07/13 0314 09/10/13 0518  WBC 7.1 9.1 9.3 5.0 4.3  NEUTROABS 5.7  --   --   --   --   HGB 10.8* 9.5* 11.3* 9.2* 9.9*  HCT 32.6* 31.4* 34.2* 28.1* 29.7*  MCV 83.4 89.2 84.2 83.4 82.3  PLT 196 165 203 172 170   CBG:  Recent Labs Lab 09/09/13 0753 09/09/13 1209 09/09/13 1634 09/09/13 2133 09/10/13 0804  GLUCAP 103* 112* 114* 112* 95    Recent Results (from the past 240 hour(s))  CULTURE, BLOOD (ROUTINE X 2)     Status: None   Collection Time    09/05/13  3:29 PM      Result Value Ref Range Status   Specimen Description BLOOD ARM LEFT   Final   Special Requests BOTTLES DRAWN AEROBIC AND ANAEROBIC 5CC   Final   Culture  Setup Time     Final   Value: 09/05/2013 20:00     [facility]formed [facility]   Culture     Final   Value:        BLOOD CULTURE RECEIVED NO GROWTH TO DATE CULTURE WILL BE HELD FOR 5 DAYS BEFORE ISSUING A FINAL NEGATIVE REPORT     [facility]formed at [facility]   Report Status PENDING   Incomplete  CULTURE, BLOOD (ROUTINE X 2)     Status: None   Collection Time    09/05/13  3:34 PM      Result Value Ref Range Status   Specimen Description BLOOD LEFT FOREARM   Final   Special Requests BOTTLES DRAWN AEROBIC ONLY 10CC   Final   Culture  Setup Time     Final   Value: 09/05/2013 20:00     [facility]formed at [facility]   Culture     Final   Value:        BLOOD CULTURE RECEIVED NO GROWTH TO DATE CULTURE WILL BE HELD FOR 5 DAYS BEFORE ISSUING A FINAL NEGATIVE REPORT      [facility]formed at [facility]   Report Status PENDING   Incomplete  URINE CULTURE     Status: None   Collection Time    09/05/13  5:27 PM      Result Value Ref Range Status   Specimen Description URINE, RANDOM   Final   Special Requests NONE   Final   Culture  Setup Time     Final   Value: 09/05/2013 20:14     [facility]formed at [facility]   Colony Count     Final   Value: >=100,000 COLONIES/ML     [facility]formed at [facility]   Culture     Final   Value: YEAST     [facility]formed [facility]   Report Status 09/09/2013 FINAL   Final  MRSA PCR SCREENING     Status: Abnormal   Collection Time    09/05/13  8:59 PM      Result Value Ref Range Status   MRSA by PCR POSITIVE (*) NEGATIVE Final   Comment:            The GeneXpert MRSA Assay (FDA     approved for NASAL specimens     only), is one component of a     comprehensive MRSA colonization     surveillance program. It is not     intended to diagnose MRSA     infection nor to guide or     monitor treatment for     MRSA infections.     RESULT CALLED TO, READ BACK BY AND VERIFIED WITH:     T.THOMAS,RN 0145 09/06/13 M.CAMPBELL     Studies:  Recent x-ray studies have been reviewed in detail by the Attending Physician  Scheduled Meds:  Scheduled Meds: . amLODipine  10 mg Oral Daily  . apixaban  10 mg Oral BID   Followed by  . [START ON 09/13/2013] apixaban  5 mg Oral BID  . aztreonam  2 g Intravenous 3 times per day  . carvedilol  12.5 mg Oral BID WC  . cloNIDine  0.1 mg Oral TID  . feeding supplement (GLUCERNA SHAKE)  237 mL Oral Q1500  . feeding supplement (PRO-STAT SUGAR FREE 64)  30 mL Oral Daily  . insulin aspart  0-15 Units Subcutaneous TID WC  . linezolid  600 mg Oral Q12H  . mupirocin ointment  1 application Nasal BID  . tamsulosin  0.4 mg Oral Daily    Time spent on care of this patient: 35 mins   [facility] A  [facility] Office  [phone] Pager - Text Page per Loretha Stapler as  per below:  On-Call/Text Page:      [email or web address]      password [email or web address]  If 7PM-7AM, please contact night-coverage [email or web address] Password [email or web address] 09/10/2013, 10:17 AM   LOS: 5 days

## 2013-09-11 DIAGNOSIS — I2699 Other pulmonary embolism without acute cor pulmonale: Secondary | ICD-10-CM

## 2013-09-11 DIAGNOSIS — D638 Anemia in other chronic diseases classified elsewhere: Secondary | ICD-10-CM

## 2013-09-11 LAB — CULTURE, BLOOD (ROUTINE X 2)
CULTURE: NO GROWTH
Culture: NO GROWTH

## 2013-09-11 LAB — URINE CULTURE

## 2013-09-11 LAB — BASIC METABOLIC PANEL
BUN: 23 mg/dL (ref 6–23)
CO2: 23 meq/L (ref 19–32)
Calcium: 8.2 mg/dL — ABNORMAL LOW (ref 8.4–10.5)
Chloride: 112 mEq/L (ref 96–112)
Creatinine, Ser: 1.42 mg/dL — ABNORMAL HIGH (ref 0.50–1.35)
GFR calc Af Amer: 57 mL/min — ABNORMAL LOW (ref >90)
GFR calc non Af Amer: 49 mL/min — ABNORMAL LOW (ref >90)
GLUCOSE: 125 mg/dL — AB (ref 70–99)
Potassium: 3.9 mEq/L (ref 3.7–5.3)
Sodium: 149 mEq/L — ABNORMAL HIGH (ref 137–147)

## 2013-09-11 LAB — GLUCOSE, CAPILLARY
GLUCOSE-CAPILLARY: 117 mg/dL — AB (ref 70–99)
GLUCOSE-CAPILLARY: 154 mg/dL — AB (ref 70–99)
Glucose-Capillary: 124 mg/dL — ABNORMAL HIGH (ref 70–99)

## 2013-09-11 MED ORDER — APIXABAN 5 MG PO TABS: 5.0000 mg | ORAL_TABLET | Freq: Two times a day (BID) | ORAL | Status: DC

## 2013-09-11 MED ORDER — HYDROCODONE-ACETAMINOPHEN 5-325 MG PO TABS: ORAL_TABLET | ORAL | Status: DC

## 2013-09-11 MED ORDER — DOXYCYCLINE HYCLATE 100 MG PO CAPS: 100.0000 mg | ORAL_CAPSULE | Freq: Two times a day (BID) | ORAL | Status: DC

## 2013-09-11 MED ORDER — FLUCONAZOLE 150 MG PO TABS
150.0000 mg | ORAL_TABLET | Freq: Every day | ORAL | Status: DC
Start: 2013-09-11 — End: 2014-03-26

## 2013-09-11 NOTE — Progress Notes (Signed)
NURSING PROGRESS NOTE  Justin HansenDonald Barhorst Sr. [id number] Discharge Data: 09/11/2013 4:02 PM Attending Provider: Clydia LlanoMutaz Elmahi, MD YQM:VHQI,ONGEXBPCP:[name],GERALD Kirtland BouchardK, MD     Justin Hansenonald Arrants Sr. to be D/C'd Skilled nursing facility per MD order.  Report given to SNF with all questions fully answered. All IV's discontinued with no bleeding noted. All belongings returned to patient for patient to take upon discharge.  Last Vital Signs:  Blood pressure 159/76, pulse 77, temperature 97.9 F (36.6 C), temperature source Oral, resp. rate 18, height 5\' 10"  (1.778 m), weight 104.463 kg (230 lb 4.8 oz), SpO2 97.00%.  Discharge Medication List   Medication List         amLODipine 5 MG tablet  Commonly known as:  NORVASC  Take 5 mg by mouth daily.     apixaban 5 MG Tabs tablet  Commonly known as:  ELIQUIS  Take 1 tablet (5 mg total) by mouth 2 (two) times daily.     carvedilol 12.5 MG tablet  Commonly known as:  COREG  Take 1 tablet (12.5 mg total) by mouth 2 (two) times daily with a meal.     CENTRUM SILVER ADULT 50+ Tabs  Take 0.5 tablets by mouth daily.     cloNIDine 0.1 MG tablet  Commonly known as:  CATAPRES  Take 0.1 mg by mouth every 3 (three) days.     diphenhydrAMINE 50 MG tablet  Commonly known as:  BENADRYL  Take 50 mg by mouth at bedtime as needed for itching.     doxycycline 100 MG capsule  Commonly known as:  VIBRAMYCIN  Take 1 capsule (100 mg total) by mouth 2 (two) times daily.     feeding supplement (PRO-STAT SUGAR FREE 64) Liqd  Take 30 mLs by mouth daily.     fluconazole 150 MG tablet  Commonly known as:  DIFLUCAN  Take 1 tablet (150 mg total) by mouth daily.     HYDROcodone-acetaminophen 5-325 MG per tablet  Commonly known as:  NORCO/VICODIN  Take one tablet by mouth every four hours as needed for moderate pain     insulin aspart 100 UNIT/ML injection  Commonly known as:  novoLOG  Inject into the skin 3 (three) times daily with meals. SSI 200-300   7 UNITS; 301-400  9 UNITS;  401-500   12  UNITS; > 500 14  UNITS AND CALL MD   CBG AC AND QHS     MOISTURE BARRIER SKIN Oint  Apply 1 application topically 2 (two) times daily. For sacrum     MUSCLE RUB 10-15 % Crea  Apply 1 application topically as needed (neck and shoulder pain).     omeprazole 20 MG capsule  Commonly known as:  PRILOSEC  Take 20 mg by mouth daily.     ondansetron 4 MG tablet  Commonly known as:  ZOFRAN  Take 1 tablet (4 mg total) by mouth every 6 (six) hours as needed for nausea.     tamsulosin 0.4 MG Caps capsule  Commonly known as:  FLOMAX  Take 1 capsule (0.4 mg total) by mouth daily.     triamcinolone cream 0.1 %  Commonly known as:  KENALOG  Apply 1 application topically 3 (three) times daily. On back and bottom         [employer] Casie Sturgeon, RN

## 2013-09-11 NOTE — Discharge Instructions (Signed)
Information on my medicine - ELIQUIS (apixaban)  Why was Eliquis prescribed for you? Eliquis was prescribed to treat blood clots that may have been found in the veins of your legs (deep vein thrombosis) or in your lungs (pulmonary embolism) and to reduce the risk of them occurring again.  What do You need to know about Eliquis ? The starting dose is 10 mg (two 5 mg tablets) taken TWICE daily for the FIRST SEVEN (7) DAYS, then on 09/12/13  the dose is reduced to ONE 5 mg tablet taken TWICE daily.  Eliquis may be taken with or without food.   Try to take the dose about the same time in the morning and in the evening. If you have difficulty swallowing the tablet whole please discuss with your pharmacist how to take the medication safely.  Take Eliquis exactly as prescribed and DO NOT stop taking Eliquis without talking to the doctor who prescribed the medication.  Stopping may increase your risk of developing a new blood clot.  Refill your prescription before you run out.  After discharge, you should have regular check-up appointments with your healthcare provider that is prescribing your Eliquis.    What do you do if you miss a dose? If a dose of ELIQUIS is not taken at the scheduled time, take it as soon as possible on the same day and twice-daily administration should be resumed. The dose should not be doubled to make up for a missed dose.  Important Safety Information A possible side effect of Eliquis is bleeding. You should call your healthcare provider right away if you experience any of the following:   Bleeding from an injury or your nose that does not stop.   Unusual colored urine (red or dark brown) or unusual colored stools (red or black).   Unusual bruising for unknown reasons.   A serious fall or if you hit your head (even if there is no bleeding).  Some medicines may interact with Eliquis and might increase your risk of bleeding or clotting while on Eliquis. To help avoid  this, consult your healthcare provider or pharmacist prior to using any new prescription or non-prescription medications, including herbals, vitamins, non-steroidal anti-inflammatory drugs (NSAIDs) and supplements.  This website has more information on Eliquis (apixaban): [email or web address].

## 2013-09-11 NOTE — Discharge Summary (Signed)
Physician Discharge Summary  Semisi Biela Sr. ZOX:[id number] DOB: 1946-03-20 DOA: 09/05/2013  PCP: [name], MD  Admit date: 09/05/2013 Discharge date: 09/11/2013  Time spent: 40 minutes  Recommendations for Outpatient Follow-up:  1. Followup with [facility] M.[facility]. 2. CBC/BMP within one week.  Discharge Diagnoses:  Principal Problem:   HCAP (healthcare-associated pneumonia) Active Problems:   DIABETES MELLITUS, WITH VASCULAR COMPLICATIONS   Chronic diastolic CHF (congestive heart failure)   Chronic hypertension   Diabetic peripheral neuropathy   Hypertension   Acute encephalopathy   S/P BKA (below knee amputation) unilateral   Acute pulmonary embolism   Discharge Condition: Stable  Diet recommendation: Heart healthy  Filed Weights   09/08/13 0405 09/08/13 1552 09/11/13 0519  Weight: 104.9 kg (231 lb 4.2 oz) 106.777 kg (235 lb 6.4 oz) 104.463 kg (230 lb 4.8 oz)    History of present illness:  68 y.o. male with a history of left below-the-knee amputation, poorly controlled type 2 diabetes mellitus, hypertension, currently residing at [facility], who was referred to the emergency department with complaints of fevers and chills. Patient reported a one-week history of cough, shortness of breath, with associated sputum characterized as yellow/green, associated with an overall functional decline. Patient over the preceding week had been increasingly lethargic, unable to participate with physical therapy, having minimal by mouth intake and increased confusion. Chest x-ray performed on admission showing no active disease. CBC showed a white count within normal limits at 7100. Given clinical suspicion for healthcare pneumonia patient was started on broad-spectrum IV antibiotic therapy with vancomycin and aztreonam in the emergency room.  Hospital Course:   [facility] associated pneumonia  -Up on admission started on empiric antibiotics, Zyvox and  aztreonam secondary to allergy to Ancef. -Patient did receive 6 days of above antibiotics. Vancomycin was not done because of worsening kidney function. -On discharge doxycycline 100 mg by mouth twice a day for 5 more days.  AKI on Stage III chronic kidney disease  -Baseline creatinine reportedly 1.9 - rising steadily. Part of this secondary to contrast nephropathy. -Patient started on IV fluids, vancomycin discontinued.  -Creatinine went back down to 1.4 with IV fluids, that created some fluids retention.  -Patient started on Lasix, feels much better prior to discharge written  Small LUL region PE  -CT scan of the chest showed left upper lobe regional PE. -Patient is full anticoagulation with Eliquis (adjusted to renal function)  +UA  -On empiric abx for PNA, urine culture showed yeast.  -Fluconazole added, 5 more days on discharge.  7 mm ground-glass attenuation nodular opacity in the periphery of the lingula  Followup imaging after antibiotic therapy will be indicated   Acute toxic metabolic encephalopathy  Resolved - pt appears to be back to baseline   Chronic diastolic congestive heart failure  TTE 03/09/2013 showed an ejection fraction of 50-50%, grade 1 diastolic dysfunction - clinically compensated at present   Type 2 diabetes mellitus  Follow CBG without change today   Hypertension  Blood pressure now climbing - slow IVF - adjust medical tx   Anemia of chronic disease  Hemoglobin appears stable at this time   Status post below the knee amputation   Obesity - Body mass index is 33.78 kg/(m^2).   MRSA screen positive   Procedures:  None  Consultations:  None  Discharge Exam: Filed Vitals:   09/11/13 1138  BP: 146/76  Pulse:   Temp:   Resp:    General: Alert and awake, oriented x3,  not in any acute distress. HEENT: anicteric sclera, pupils reactive to light and accommodation, EOMI CVS: S1-S2 clear, no murmur rubs or gallops Chest: clear to  auscultation bilaterally, no wheezing, rales or rhonchi Abdomen: soft nontender, nondistended, normal bowel sounds, no organomegaly Extremities: Left BKA stump without acute findings, +1 edema in the right leg Neuro: Cranial nerves II-XII intact, no focal neurological deficits  Discharge Instructions You were cared for by a hospitalist during your hospital stay. If you have any questions about your discharge medications or the care you received while you were in the hospital after you are discharged, you can call the unit and asked to speak with the hospitalist on call if the hospitalist that took care of you is not available. Once you are discharged, your primary care physician will handle any further medical issues. Please note that NO REFILLS for any discharge medications will be authorized once you are discharged, as it is imperative that you return to your primary care physician (or establish a relationship with a primary care physician if you do not have one) for your aftercare needs so that they can reassess your need for medications and monitor your lab values.  Discharge Orders   Future Orders Complete By Expires   Diet - low sodium heart healthy  As directed    Increase activity slowly  As directed        Medication List         amLODipine 5 MG tablet  Commonly known as:  NORVASC  Take 5 mg by mouth daily.     apixaban 5 MG Tabs tablet  Commonly known as:  ELIQUIS  Take 1 tablet (5 mg total) by mouth 2 (two) times daily.     carvedilol 12.5 MG tablet  Commonly known as:  COREG  Take 1 tablet (12.5 mg total) by mouth 2 (two) times daily with a meal.     CENTRUM SILVER ADULT 50+ Tabs  Take 0.5 tablets by mouth daily.     cloNIDine 0.1 MG tablet  Commonly known as:  CATAPRES  Take 0.1 mg by mouth every 3 (three) days.     diphenhydrAMINE 50 MG tablet  Commonly known as:  BENADRYL  Take 50 mg by mouth at bedtime as needed for itching.     doxycycline 100 MG capsule   Commonly known as:  VIBRAMYCIN  Take 1 capsule (100 mg total) by mouth 2 (two) times daily.     feeding supplement (PRO-STAT SUGAR FREE 64) Liqd  Take 30 mLs by mouth daily.     fluconazole 150 MG tablet  Commonly known as:  DIFLUCAN  Take 1 tablet (150 mg total) by mouth daily.     HYDROcodone-acetaminophen 5-325 MG per tablet  Commonly known as:  NORCO/VICODIN  Take one tablet by mouth every four hours as needed for moderate pain     insulin aspart 100 UNIT/ML injection  Commonly known as:  novoLOG  Inject into the skin 3 (three) times daily with meals. SSI 200-300   7 UNITS; 301-400  9 UNITS; 401-500   12  UNITS; > 500 14  UNITS AND CALL MD   CBG AC AND QHS     MOISTURE BARRIER SKIN Oint  Apply 1 application topically 2 (two) times daily. For sacrum     MUSCLE RUB 10-15 % Crea  Apply 1 application topically as needed (neck and shoulder pain).     omeprazole 20 MG capsule  Commonly known as:  PRILOSEC  Take 20 mg by mouth daily.     ondansetron 4 MG tablet  Commonly known as:  ZOFRAN  Take 1 tablet (4 mg total) by mouth every 6 (six) hours as needed for nausea.     tamsulosin 0.4 MG Caps capsule  Commonly known as:  FLOMAX  Take 1 capsule (0.4 mg total) by mouth daily.     triamcinolone cream 0.1 %  Commonly known as:  KENALOG  Apply 1 application topically 3 (three) times daily. On back and bottom       Allergies  Allergen Reactions  . Ancef [Cefazolin] Itching and Rash  . Sulfa Antibiotics Itching and Rash       Follow-up Information   Follow up with [name],[name], MD In 1 week.   Specialty:  Family Medicine   Contact information:   [address] [place] [address] [phone]        The results of significant diagnostics from this hospitalization (including imaging, microbiology, ancillary and laboratory) are listed below for reference.    Significant Diagnostic Studies: Dg Chest 2 View  09/06/2013   CLINICAL DATA:  Pulmonary  embolism.  Pleural effusions.  EXAM: CHEST  2 VIEW  COMPARISON:  CT scan dated 09/06/2013 and chest x-rays dated 09/05/2013 and 03/06/2013  FINDINGS: There is a small to moderate right pleural effusion and a tiny left effusion. Heart size and pulmonary vascularity are normal and the lungs are clear. There is chronic shift of the trachea to the right at the level of the thoracic inlet.  IMPRESSION: Small a moderate right effusion.  Tiny left effusion.   Electronically Signed   By: Geanie CooleyJim  Maxwell M.D.   On: 09/06/2013 08:04   Ct Angio Chest Pe W/cm &/or Wo Cm  09/06/2013   CLINICAL DATA:  Fever and chills  EXAM: CT ANGIOGRAPHY CHEST WITH CONTRAST  TECHNIQUE: Multidetector CT imaging of the chest was performed using the standard protocol during bolus administration of intravenous contrast. Multiplanar CT image reconstructions and MIPs were obtained to evaluate the vascular anatomy.  CONTRAST:  75mL OMNIPAQUE IOHEXOL 350 MG/ML SOLN  COMPARISON:  Chest x-ray obtained earlier today  FINDINGS: Mediastinum: Unremarkable CT appearance of the thyroid gland. No suspicious mediastinal or hilar adenopathy. No soft tissue mediastinal mass. The thoracic esophagus is unremarkable.  Heart/Vascular: Adequate opacification of the pulmonary arteries to the proximal subsegmental level. Filling defect within the left upper lobar pulmonary artery consistent with acute PE. The main pulmonary artery is borderline enlarged at 3 cm. Cardiomegaly. Concentric hypertrophy of the left ventricle. No pericardial effusion. Atherosclerotic calcifications noted throughout the coronary arteries. Conventional 3 vessel arch with atherosclerotic calcifications but no aneurysmal dilatation or dissection. No evidence of right heart strain. The RV/LV ratio is 0.67.  Lungs/Pleura: Evaluation for pulmonary nodules is limited by respiratory motion. Moderate right and small left layering pleural effusions. There is associated compressive atelectasis of the right  greater than left lower lobes. Suggestion of peribronchovascular distribution of ground-glass attenuation opacity in the anterior aspect of the right lower lobe which could reflect pneumonia or other infiltrate. Nonspecific focus of ground-glass attenuation opacity in the periphery of the lingula measures 7 mm on image 43 of series 6. Diffuse bronchial wall thickening.  Bones/Soft Tissues: No acute fracture or aggressive appearing lytic or blastic osseous lesion.  Upper Abdomen: Unremarkable imaged upper abdomen.  Review of the MIP images confirms the above findings.  IMPRESSION: 1. Positive for pulmonary embolus. Relatively small volume of embolus  in the left upper lobar pulmonary artery. 2. Moderate right and small left layering pleural effusions with associated right worse than left lower lobe compressive atelectasis. 3. Given the limitations of excessive respiratory motion, there is ground-glass attenuation opacity in the anterior right lower lobe concerning for bronchopneumonia or other acute infectious/inflammatory process. 4. Nonspecific 7 mm ground-glass attenuation nodular opacity in the periphery of the lingula may represent an additional focus of infection/inflammation. Low grade adenocarcinoma is a less likely possibility. Recommend attention on follow-up imaging following an appropriate course of therapy. 5. Atherosclerosis including multivessel coronary artery disease 6. Concentric left ventricular hypertrophy. Query systemic hypertension. 7. Cardiomegaly. Critical Value/emergent results were called by telephone at the time of interpretation on 09/06/2013 at 2:56 AM to M. York, PA with [facility], who verbally acknowledged these results.   Electronically Signed   By: Malachy Moan M.D.   On: 09/06/2013 02:57   Dg Chest Port 1 View  (if Code Sepsis Called)  09/05/2013   CLINICAL DATA:  Cough, weakness, confusion  EXAM: PORTABLE CHEST - 1 VIEW  COMPARISON:  DG CHEST 1V PORT dated 07/15/2013   FINDINGS: Low lung volumes. Cardiac silhouette is enlarged. . Both lungs are clear. The visualized skeletal structures are unremarkable.  IMPRESSION: No active disease.   Electronically Signed   By: Salome Holmes M.D.   On: 09/05/2013 15:25    Microbiology: Recent Results (from the past 240 hour(s))  CULTURE, BLOOD (ROUTINE X 2)     Status: None   Collection Time    09/05/13  3:29 PM      Result Value Ref Range Status   Specimen Description BLOOD ARM LEFT   Final   Special Requests BOTTLES DRAWN AEROBIC AND ANAEROBIC 5CC   Final   Culture  Setup Time     Final   Value: 09/05/2013 20:00     Performed at [facility]   Culture     Final   Value: NO GROWTH 5 DAYS     Performed at [facility]   Report Status 09/11/2013 FINAL   Final  CULTURE, BLOOD (ROUTINE X 2)     Status: None   Collection Time    09/05/13  3:34 PM      Result Value Ref Range Status   Specimen Description BLOOD LEFT FOREARM   Final   Special Requests BOTTLES DRAWN AEROBIC ONLY 10CC   Final   Culture  Setup Time     Final   Value: 09/05/2013 20:00     Performed at [facility]   Culture     Final   Value: NO GROWTH 5 DAYS     Performed at [facility]   Report Status 09/11/2013 FINAL   Final  URINE CULTURE     Status: None   Collection Time    09/05/13  5:27 PM      Result Value Ref Range Status   Specimen Description URINE, RANDOM   Final   Special Requests NONE   Final   Culture  Setup Time     Final   Value: 09/05/2013 20:14     Performed at [facility] Count     Final   Value: >=100,000 COLONIES/ML     Performed at [facility]   Culture     Final   Value: YEAST     Performed at [facility]   Report Status 09/09/2013 FINAL   Final  MRSA  PCR SCREENING     Status: Abnormal   Collection Time    09/05/13  8:59 PM      Result Value Ref Range Status   MRSA by PCR POSITIVE (*) NEGATIVE Final   Comment:            The GeneXpert MRSA  Assay (FDA     approved for NASAL specimens     only), is one component of a     comprehensive MRSA colonization     surveillance program. It is not     intended to diagnose MRSA     infection nor to guide or     monitor treatment for     MRSA infections.     RESULT CALLED TO, READ BACK BY AND VERIFIED WITH:     T.THOMAS,RN 0145 09/06/13 M.CAMPBELL     Labs: Basic Metabolic Panel:  Recent Labs Lab 09/06/13 0925 09/07/13 0314 09/09/13 0640 09/10/13 0518 09/11/13 0635  NA 142 145 145 145 149*  K 5.0 4.3 3.8 3.9 3.9  CL 106 109 111 112 112  CO2 21 23 19 20 23   GLUCOSE 150* 93 106* 101* 125*  BUN 29* 37* 28* 23 23  CREATININE 1.94* 2.27* 1.57* 1.48* 1.42*  CALCIUM 8.2* 7.7* 7.9* 7.7* 8.2*   Liver Function Tests:  Recent Labs Lab 09/05/13 1510 09/06/13 0323 09/06/13 0925  AST 29 10 22   ALT <5 <5 <5  ALKPHOS 68 48 63  BILITOT 0.2* 0.3 0.3  PROT 6.5 4.7* 6.6  ALBUMIN 2.5* 1.8* 2.4*   No results found for this basename: LIPASE, AMYLASE,  in the last 168 hours No results found for this basename: AMMONIA,  in the last 168 hours CBC:  Recent Labs Lab 09/05/13 1550 09/06/13 0323 09/06/13 0925 09/07/13 0314 09/10/13 0518  WBC 7.1 9.1 9.3 5.0 4.3  NEUTROABS 5.7  --   --   --   --   HGB 10.8* 9.5* 11.3* 9.2* 9.9*  HCT 32.6* 31.4* 34.2* 28.1* 29.7*  MCV 83.4 89.2 84.2 83.4 82.3  PLT 196 165 203 172 170   Cardiac Enzymes: No results found for this basename: CKTOTAL, CKMB, CKMBINDEX, TROPONINI,  in the last 168 hours BNP: BNP (last 3 results)  Recent Labs  03/24/13 0415 07/15/13 1659 09/05/13 1510  PROBNP 20835.0* 3108.0* 10229.0*   CBG:  Recent Labs Lab 09/10/13 1204 09/10/13 1728 09/10/13 2222 09/11/13 0752 09/11/13 1237  GLUCAP 130* 134* 113* 117* 154*       Signed:  Marlean Mortell A  [facility] 09/11/2013, 1:52 PM

## 2013-09-11 NOTE — Progress Notes (Signed)
Report given to RN at [facility].  

## 2013-09-11 NOTE — Evaluation (Signed)
Occupational Therapy Evaluation Patient Details Name: Justin HansenDonald [name] Sr. MRN: [id number] DOB: 07/15/1945 Today's Date: 09/11/2013    History of Present Illness 68 y.o. male admitted from [facility] with fever and chills. Pt has hx of Lt BKA, DM2, HTN.    Clinical Impression   PT admitted with fever and chills from SNF. Pt currently with functional limitiations due to the deficits listed below (see OT problem list). Pt has minimal family (A) for d/c home. Per wife she can not manage at home without increased caregivers. Pt will benefit from skilled OT to increase their independence and safety with adls and balance to allow discharge SNF. Pt demonstrates poor awareness to deficits, fair sitting balance and hoyer lift for all transfers.     Follow Up Recommendations  SNF;Supervision/Assistance - 24 hour    Equipment Recommendations  Wheelchair (measurements OT);Wheelchair cushion (measurements OT);Other (LT BKA board needed and Rt elevated leg rest) (hoyer lift)    Recommendations for Other Services       Precautions / Restrictions Precautions Precautions: Fall Precaution Comments: Lt BKA , high risk for skin break down Restrictions Weight Bearing Restrictions: Yes LLE Weight Bearing: Non weight bearing      Mobility Bed Mobility Overal bed mobility: Needs Assistance;+2 for physical assistance Bed Mobility: Supine to Sit;Sit to Supine Rolling: Max assist;+2 for physical assistance   Supine to sit: Max assist;+2 for physical assistance;+2 for safety/equipment Sit to supine: Max assist;+2 for physical assistance;+2 for safety/equipment   General bed mobility comments: Pt completing task with wife. pt was unable to sequence task and pulling on wife. Pt was able to demonstrate MOD (A) with therapist directing care and sequencing. Wife unable to manage patient at this level of care  Transfers                 General transfer comment: attempted sit<>stand unsuccessful x3.  Pt unable to stand with bed elevated so RT LE on ground     Balance Overall balance assessment: Needs assistance Sitting-balance support: Feet supported;Bilateral upper extremity supported Sitting balance-Leahy Scale: Fair                                      ADL Overall ADL's : Needs assistance/impaired     Grooming: Minimal assistance;Sitting;Bed level Grooming Details (indicate cue type and reason): cueing and sequence Upper Body Bathing: Minimal assitance;Sitting;Cueing for sequencing (supported)   Lower Body Bathing: Total assistance                       Functional mobility during ADLs:  (unable to complete sit<>stand) General ADL Comments: pt was unable with bed elevated to complete sit<>Stand at EOB total +2 (A). pt demonstrates poor bed mobility. pt with blood on sheets from scratching at skin. pt unaware of bleeding. Pt provided total (A) lotion and oil to skin to decr need to scratch skin. pt is high risk for skin break down due to inability to reposition. Wife reports that her grandson is present for portions of the day but he works at night and must sleep during the day. She states "he does the best he can but he has to work"  Pt angry at wife stating she just doesnt want to take him home and that he is going home. Wife tearful reporting 10 admissions in last year with new amputation since last return home. Wife reports previously  he could transfer with RW and they managed. Wife does not feel she can manage at home. If patient d/c home family will need hoyer lift/ w/c with elevated leg rest & Lt BKA board wheelchair cushion     Vision                     Perception Perception Perception Tested?: No   Praxis      Pertinent Vitals/Pain Dry skin- high risk for skin break down     Hand Dominance Right   Extremity/Trunk Assessment Upper Extremity Assessment Upper Extremity Assessment: Generalized weakness   Lower Extremity  Assessment Lower Extremity Assessment: Overall WFL for tasks assessed (LT BKA)   Cervical / Trunk Assessment Cervical / Trunk Assessment: Normal   Communication Communication Communication: No difficulties   Cognition Arousal/Alertness: Awake/alert Behavior During Therapy: WFL for tasks assessed/performed Overall Cognitive Status: Impaired/Different from baseline Area of Impairment: Problem solving;Awareness;Safety/judgement         Safety/Judgement: Decreased awareness of deficits Awareness: Anticipatory Problem Solving: Slow processing General Comments: pt reports ability to stand with at [facility] but when questioned further reports that RN staff use hoyer for in and out of bed. Pt's wife reports never seeing patient stand in PT session. pt reports he can go home and complete bed mobility. Pt was unable to sequence and complete bed mobility with wife. Pt has poor awareness to deficits   General Comments       Exercises       Shoulder Instructions      Home Living Family/patient expects to be discharged to:: Skilled nursing facility Living Arrangements: Spouse/significant other Available Help at Discharge: Skilled Nursing Facility Type of Home: House Home Access: Ramped entrance     Home Layout: One level               Home Equipment: Environmental consultant - 2 wheels;Wheelchair - manual   Additional Comments: pt requesting to d/c home. Wife does not feel she can manage patient at this level at home without a lot of extra help.       Prior Functioning/Environment Level of Independence: Needs assistance  Gait / Transfers Assistance Needed: working with PT at [facility] last working with PT on 09/04/13 ADL's / Homemaking Assistance Needed: per staff at [facility] pt is total (A). Per patient he can wash from face to waist line   Comments: pt using bed pan for all voids    OT Diagnosis: Generalized weakness;Cognitive deficits   OT Problem List: Decreased strength;Decreased  range of motion;Decreased activity tolerance;Impaired balance (sitting and/or standing);Decreased cognition;Decreased safety awareness;Decreased knowledge of use of DME or AE;Obesity   OT Treatment/Interventions: Self-care/ADL training;Therapeutic exercise;Energy conservation;DME and/or AE instruction;Therapeutic activities;Cognitive remediation/compensation;Patient/family education;Balance training    OT Goals(Current goals can be found in the care plan section) Acute Rehab OT Goals Patient Stated Goal: to go home OT Goal Formulation: With patient/family Time For Goal Achievement: 09/25/13 Potential to Achieve Goals: Good  OT Frequency: Min 2X/week   Barriers to D/C: Decreased caregiver support  wife would be sole provider of care       Co-evaluation              End of Session Nurse Communication: Need for lift equipment;Precautions;Other (comment) (need for turning in bed for skin integrity)  Activity Tolerance: Patient limited by fatigue Patient left: in bed;with call bell/phone within reach;with bed alarm set;with family/visitor present   Time: 1030-1105 OT Time Calculation (min): 35 min Charges:  OT General  Charges $OT Visit: 1 Procedure OT Evaluation $Initial OT Evaluation Tier I: 1 Procedure OT Treatments $Self Care/Home Management : 23-37 mins G-Codes:    Boone Master B 10/09/13, 11:50 AM Pager: [phone]

## 2013-09-11 NOTE — Evaluation (Signed)
Physical Therapy Evaluation Patient Details Name: Justin HansenDonald Spruce Sr. MRN: [id number] DOB: 06/07/1946 Today's Date: 09/11/2013   History of Present Illness   68 y.o. male with a past medical history of left below-the-knee amputation, poorly controlled type 2 diabetes mellitus, hypertension, currently resident at [facility], who was referred to the emergency department this afternoon with complaints of fevers and chills. Patient having a one-week history of cough, shortness of breath, with associated sputum characterized as yellow/green, associated with an overall functional decline. Patient with a past week has become increasingly lethargic, unable to process may with physical therapy, having minimal by mouth intake and increased confusion.   Clinical Impression  Pt requires total A, unable to stand to transfer to chair, will need total A if plan to d/c home.  PT recommends SNF and continued acute PT to address deficits and increase functional independence.    Follow Up Recommendations SNF    Equipment Recommendations  None recommended by PT    Recommendations for Other Services       Precautions / Restrictions Precautions Precautions: Fall Restrictions Weight Bearing Restrictions: No      Mobility  Bed Mobility Overal bed mobility: Needs Assistance Bed Mobility: Supine to Sit;Sit to Supine;Rolling Rolling: Mod assist   Supine to sit: Mod assist Sit to supine: Mod assist   General bed mobility comments: needs asssit for trunk and B LEs, attempt to have pt perform with increased independence since he wants to d/c home, pt staties "my wife will just pick me up".  pt requires mod A for rolling to both sides for changing linens in bed  Transfers                 General transfer comment: attempted sit to stand and SPT with RW and +2 assist from raised bed height, pt unable to assist with standing with R LE,   Attempted to encourage pt to try, pt states, "my  wife will pick me up and put me in the chair"  Ambulation/Gait                Stairs            Wheelchair Mobility    Modified Rankin (Stroke Patients Only)       Balance                                             Pertinent Vitals/Pain No c/o pain.  Session performed on 2L [place]    Home Living Family/patient expects to be discharged to:: Private residence Living Arrangements: Spouse/significant other Available Help at Discharge: Family Type of Home: House Home Access: Ramped entrance     Home Layout: One level Home Equipment: Environmental consultantWalker - 2 wheels;Wheelchair - manual      Prior Function Level of Independence: Needs assistance         Comments: Pt reports he used a bed pan and stayed in the bed at home most of the time     Hand Dominance        Extremity/Trunk Assessment   Upper Extremity Assessment: Generalized weakness           Lower Extremity Assessment: Generalized weakness      Cervical / Trunk Assessment: Normal  Communication   Communication: No difficulties  Cognition Arousal/Alertness: Awake/alert Behavior During Therapy: WFL for tasks assessed/performed Overall Cognitive  Status: No family/caregiver present to determine baseline cognitive functioning (delayed processing)                      General Comments General comments (skin integrity, edema, etc.): able to sit EOB x 10 minutes without assist    Exercises        Assessment/Plan    PT Assessment Patient needs continued PT services  PT Diagnosis Difficulty walking;Generalized weakness   PT Problem List Decreased strength;Decreased safety awareness;Decreased mobility;Decreased activity tolerance;Decreased balance;Decreased knowledge of use of DME;Cardiopulmonary status limiting activity  PT Treatment Interventions DME instruction;Therapeutic exercise;Gait training;Balance training;Modalities;Neuromuscular re-education;Functional mobility  training;Therapeutic activities;Patient/family education;Wheelchair mobility training   PT Goals (Current goals can be found in the Care Plan section) Acute Rehab PT Goals Patient Stated Goal: go home PT Goal Formulation: With patient Time For Goal Achievement: 09/18/13 Potential to Achieve Goals: Good    Frequency Min 3X/week   Barriers to discharge Decreased caregiver support pt will require total +2 assist    Co-evaluation               End of Session Equipment Utilized During Treatment: Oxygen Activity Tolerance: Patient limited by fatigue Patient left: in bed;with call bell/phone within reach;with bed alarm set Nurse Communication: Mobility status         Time: 6045-4098 PT Time Calculation (min): 20 min   Charges:   PT Evaluation $Initial PT Evaluation Tier I: 1 Procedure PT Treatments $Therapeutic Activity: 8-22 mins   PT G Codes:          DONAWERTH,KAREN 09/11/2013, 9:50 AM

## 2013-09-11 NOTE — Clinical Social Work Note (Signed)
CSW received call from patient's wife stating that patient does not have any more Medicare days left. Wife states this is what [facility] business [facility]. Patient's wife wants to take patient home if patient does not have anymore Medicare days. CSW was told by [facility] that the patient used 37 days at their facility between 03/15/13 and 05/02/13, and was told by [facility] that the patient used 47 days between 1/28-2/7 and 2/13-3/31 at their facility. CSW has updated RNCM by voicemail.   Roddie McBryant Hollis Tuller MSW, [place]CS[place], [place], [phone]

## 2013-09-11 NOTE — Clinical Social Work Note (Signed)
CSW received call from [facility]. They state that they made a mistake and patient actually has 7 Medicare days remaining. CSW will request transport to [facility].  [name] MSW, [place]CS[place], [place], [phone]

## 2013-09-11 NOTE — Clinical Social Work Placement (Signed)
Clinical Social Work Department CLINICAL SOCIAL WORK PLACEMENT NOTE 09/11/2013  Patient:  [name],[name]  Account Number:  [id number] Admit date:  09/05/2013  Clinical Social Worker:  Maryclare LabradorJULIE ANDERSON, Theresia MajorsLCSWA  Date/time:  09/08/2013 03:16 PM  Clinical Social Work is seeking post-discharge placement for this patient at the following level of care:   SKILLED NURSING   (*CSW will update this form in Epic as items are completed)     Patient/family provided with [facility] Department of Clinical Social Work's list of facilities offering this level of care within the geographic area requested by the patient (or if unable, by the patient's family).  09/08/2013  Patient/family informed of their freedom to choose among providers that offer the needed level of care, that participate in Medicare, Medicaid or managed care program needed by the patient, have an available bed and are willing to accept the patient.    Patient/family informed of [facility]' ownership interest in [name], as well as of the fact that they are under no obligation to receive care at this facility.  PASARR submitted to EDS on  PASARR number received from EDS on   FL2 transmitted to all facilities in geographic area requested by pt/family on  09/08/2013 FL2 transmitted to all facilities within larger geographic area on   Patient informed that his/her managed care company has contracts with or will negotiate with  certain facilities, including the following:     Patient/family informed of bed offers received:  09/11/2013 [facility] Physician recommends and patient chooses bed at    [facility] on  09/11/2013 Patient to be transferred to facility by Ambulance  The following physician request were entered in Epic:   Additional Comments: Per MD patient ready to [facility]. RN, patient, wife, and  facility notified of DC. Facility states that patient has 7 Medicare days remaining. RN given number for report. DC packet on chart. Ambulance transport requested for patient. CSW contacted Licensed conveyancerunit secretary and asked that she notify RN to contact wife when patient is being transported to facility. CSW signing off at this time.   Roddie McBryant [name] MSW, [place]CS[place], [place], [phone]

## 2014-03-26 ENCOUNTER — Inpatient Hospital Stay (HOSPITAL_COMMUNITY)
Admission: EM | Admit: 2014-03-26 | Discharge: 2014-03-28 | DRG: 312 | Disposition: A | Discharge status: Home / Self Care | Attending: Internal Medicine | Admitting: Internal Medicine

## 2014-03-26 ENCOUNTER — Emergency Department (HOSPITAL_COMMUNITY)

## 2014-03-26 ENCOUNTER — Encounter (HOSPITAL_COMMUNITY): Payer: Self-pay | Admitting: Emergency Medicine

## 2014-03-26 DIAGNOSIS — Z885 Allergy status to narcotic agent status: Secondary | ICD-10-CM

## 2014-03-26 DIAGNOSIS — Z8701 Personal history of pneumonia (recurrent): Secondary | ICD-10-CM | POA: Diagnosis not present

## 2014-03-26 DIAGNOSIS — Z79899 Other long term (current) drug therapy: Secondary | ICD-10-CM

## 2014-03-26 DIAGNOSIS — Z86711 Personal history of pulmonary embolism: Secondary | ICD-10-CM

## 2014-03-26 DIAGNOSIS — R55 Syncope and collapse: Secondary | ICD-10-CM | POA: Diagnosis present

## 2014-03-26 DIAGNOSIS — Z881 Allergy status to other antibiotic agents status: Secondary | ICD-10-CM

## 2014-03-26 DIAGNOSIS — R5381 Other malaise: Secondary | ICD-10-CM | POA: Diagnosis present

## 2014-03-26 DIAGNOSIS — R7881 Bacteremia: Secondary | ICD-10-CM

## 2014-03-26 DIAGNOSIS — D638 Anemia in other chronic diseases classified elsewhere: Secondary | ICD-10-CM

## 2014-03-26 DIAGNOSIS — G9340 Encephalopathy, unspecified: Secondary | ICD-10-CM

## 2014-03-26 DIAGNOSIS — E1151 Type 2 diabetes mellitus with diabetic peripheral angiopathy without gangrene: Secondary | ICD-10-CM | POA: Diagnosis present

## 2014-03-26 DIAGNOSIS — E1165 Type 2 diabetes mellitus with hyperglycemia: Secondary | ICD-10-CM | POA: Diagnosis present

## 2014-03-26 DIAGNOSIS — Z87891 Personal history of nicotine dependence: Secondary | ICD-10-CM | POA: Diagnosis not present

## 2014-03-26 DIAGNOSIS — N179 Acute kidney failure, unspecified: Secondary | ICD-10-CM

## 2014-03-26 DIAGNOSIS — I129 Hypertensive chronic kidney disease with stage 1 through stage 4 chronic kidney disease, or unspecified chronic kidney disease: Secondary | ICD-10-CM | POA: Diagnosis present

## 2014-03-26 DIAGNOSIS — Z89512 Acquired absence of left leg below knee: Secondary | ICD-10-CM

## 2014-03-26 DIAGNOSIS — K219 Gastro-esophageal reflux disease without esophagitis: Secondary | ICD-10-CM | POA: Diagnosis present

## 2014-03-26 DIAGNOSIS — R1311 Dysphagia, oral phase: Secondary | ICD-10-CM | POA: Diagnosis present

## 2014-03-26 DIAGNOSIS — Z7401 Bed confinement status: Secondary | ICD-10-CM

## 2014-03-26 DIAGNOSIS — Z794 Long term (current) use of insulin: Secondary | ICD-10-CM | POA: Diagnosis not present

## 2014-03-26 DIAGNOSIS — IMO0002 Reserved for concepts with insufficient information to code with codable children: Secondary | ICD-10-CM

## 2014-03-26 DIAGNOSIS — E785 Hyperlipidemia, unspecified: Secondary | ICD-10-CM | POA: Diagnosis present

## 2014-03-26 DIAGNOSIS — I5032 Chronic diastolic (congestive) heart failure: Secondary | ICD-10-CM | POA: Diagnosis present

## 2014-03-26 DIAGNOSIS — I499 Cardiac arrhythmia, unspecified: Secondary | ICD-10-CM | POA: Diagnosis present

## 2014-03-26 DIAGNOSIS — R1313 Dysphagia, pharyngeal phase: Secondary | ICD-10-CM | POA: Diagnosis present

## 2014-03-26 DIAGNOSIS — E876 Hypokalemia: Secondary | ICD-10-CM | POA: Diagnosis present

## 2014-03-26 DIAGNOSIS — B958 Unspecified staphylococcus as the cause of diseases classified elsewhere: Secondary | ICD-10-CM

## 2014-03-26 DIAGNOSIS — N189 Chronic kidney disease, unspecified: Secondary | ICD-10-CM

## 2014-03-26 DIAGNOSIS — R29810 Facial weakness: Secondary | ICD-10-CM | POA: Diagnosis present

## 2014-03-26 DIAGNOSIS — N184 Chronic kidney disease, stage 4 (severe): Secondary | ICD-10-CM | POA: Diagnosis present

## 2014-03-26 DIAGNOSIS — I10 Essential (primary) hypertension: Secondary | ICD-10-CM

## 2014-03-26 DIAGNOSIS — E1159 Type 2 diabetes mellitus with other circulatory complications: Secondary | ICD-10-CM

## 2014-03-26 DIAGNOSIS — R112 Nausea with vomiting, unspecified: Secondary | ICD-10-CM

## 2014-03-26 LAB — CBC
HEMATOCRIT: 31.8 % — AB (ref 39.0–52.0)
HEMOGLOBIN: 10.9 g/dL — AB (ref 13.0–17.0)
MCH: 26.1 pg (ref 26.0–34.0)
MCHC: 34.3 g/dL (ref 30.0–36.0)
MCV: 76.3 fL — AB (ref 78.0–100.0)
Platelets: 251 10*3/uL (ref 150–400)
RBC: 4.17 MIL/uL — ABNORMAL LOW (ref 4.22–5.81)
RDW: 14.4 % (ref 11.5–15.5)
WBC: 8 10*3/uL (ref 4.0–10.5)

## 2014-03-26 LAB — TROPONIN I

## 2014-03-26 LAB — I-STAT TROPONIN, ED
Troponin i, poc: 0.02 ng/mL (ref 0.00–0.08)
Troponin i, poc: 0.03 ng/mL (ref 0.00–0.08)

## 2014-03-26 LAB — I-STAT CG4 LACTIC ACID, ED
LACTIC ACID, VENOUS: 1.05 mmol/L (ref 0.5–2.2)
Lactic Acid, Venous: 0.73 mmol/L (ref 0.5–2.2)

## 2014-03-26 LAB — BASIC METABOLIC PANEL
Anion gap: 11 (ref 5–15)
BUN: 10 mg/dL (ref 6–23)
CHLORIDE: 108 meq/L (ref 96–112)
CO2: 28 mEq/L (ref 19–32)
Calcium: 7.9 mg/dL — ABNORMAL LOW (ref 8.4–10.5)
Creatinine, Ser: 1.35 mg/dL (ref 0.50–1.35)
GFR calc Af Amer: 60 mL/min — ABNORMAL LOW (ref >90)
GFR, EST NON AFRICAN AMERICAN: 52 mL/min — AB (ref >90)
GLUCOSE: 121 mg/dL — AB (ref 70–99)
Potassium: 2.9 mEq/L — CL (ref 3.7–5.3)
Sodium: 147 mEq/L (ref 137–147)

## 2014-03-26 LAB — CBG MONITORING, ED: GLUCOSE-CAPILLARY: 122 mg/dL — AB (ref 70–99)

## 2014-03-26 LAB — GLUCOSE, CAPILLARY: Glucose-Capillary: 117 mg/dL — ABNORMAL HIGH (ref 70–99)

## 2014-03-26 MED ORDER — ACETAMINOPHEN 650 MG RE SUPP: 650.0000 mg | Freq: Four times a day (QID) | RECTAL | Status: DC | PRN

## 2014-03-26 MED ORDER — ONDANSETRON HCL 4 MG PO TABS: 4.0000 mg | ORAL_TABLET | Freq: Four times a day (QID) | ORAL | Status: DC | PRN

## 2014-03-26 MED ORDER — SODIUM CHLORIDE 0.9 % IV SOLN: 250.0000 mL | INTRAVENOUS | Status: DC | PRN

## 2014-03-26 MED ORDER — HYDROXYZINE HCL 10 MG PO TABS
10.0000 mg | ORAL_TABLET | Freq: Three times a day (TID) | ORAL | Status: DC | PRN
  Filled 2014-03-26: qty 1

## 2014-03-26 MED ORDER — CARVEDILOL 12.5 MG PO TABS
12.5000 mg | ORAL_TABLET | Freq: Two times a day (BID) | ORAL | Status: DC
  Administered 2014-03-26 – 2014-03-28 (×5): 12.5 mg via ORAL
  Filled 2014-03-26 (×7): qty 1

## 2014-03-26 MED ORDER — ADULT MULTIVITAMIN W/MINERALS CH
1.0000 | ORAL_TABLET | Freq: Every day | ORAL | Status: DC
  Administered 2014-03-27 – 2014-03-28 (×2): 1 via ORAL
  Filled 2014-03-26 (×3): qty 1

## 2014-03-26 MED ORDER — ACETAMINOPHEN 325 MG PO TABS: 650.0000 mg | ORAL_TABLET | Freq: Four times a day (QID) | ORAL | Status: DC | PRN

## 2014-03-26 MED ORDER — SODIUM CHLORIDE 0.9 % IJ SOLN
3.0000 mL | Freq: Two times a day (BID) | INTRAMUSCULAR | Status: DC
  Administered 2014-03-26 – 2014-03-27 (×2): 3 mL via INTRAVENOUS

## 2014-03-26 MED ORDER — CLONIDINE HCL 0.1 MG PO TABS
0.1000 mg | ORAL_TABLET | Freq: Three times a day (TID) | ORAL | Status: DC
  Administered 2014-03-26 – 2014-03-28 (×6): 0.1 mg via ORAL
  Filled 2014-03-26 (×7): qty 1

## 2014-03-26 MED ORDER — POTASSIUM CHLORIDE CRYS ER 20 MEQ PO TBCR: 40.0000 meq | EXTENDED_RELEASE_TABLET | Freq: Once | ORAL | Status: DC

## 2014-03-26 MED ORDER — CENTRUM SILVER ADULT 50+ PO TABS: 0.5000 | ORAL_TABLET | Freq: Every day | ORAL | Status: DC

## 2014-03-26 MED ORDER — AMLODIPINE BESYLATE 5 MG PO TABS
5.0000 mg | ORAL_TABLET | Freq: Every day | ORAL | Status: DC
  Administered 2014-03-27 – 2014-03-28 (×2): 5 mg via ORAL
  Filled 2014-03-26 (×2): qty 1

## 2014-03-26 MED ORDER — ASPIRIN 325 MG PO TABS
325.0000 mg | ORAL_TABLET | Freq: Every day | ORAL | Status: DC
  Administered 2014-03-26 – 2014-03-28 (×3): 325 mg via ORAL
  Filled 2014-03-26 (×3): qty 1

## 2014-03-26 MED ORDER — GUAIFENESIN ER 600 MG PO TB12
600.0000 mg | ORAL_TABLET | Freq: Two times a day (BID) | ORAL | Status: DC
  Administered 2014-03-27 – 2014-03-28 (×4): 600 mg via ORAL
  Filled 2014-03-26 (×5): qty 1

## 2014-03-26 MED ORDER — TAMSULOSIN HCL 0.4 MG PO CAPS
0.4000 mg | ORAL_CAPSULE | Freq: Every day | ORAL | Status: DC
  Administered 2014-03-27 – 2014-03-28 (×2): 0.4 mg via ORAL
  Filled 2014-03-26 (×2): qty 1

## 2014-03-26 MED ORDER — POTASSIUM CHLORIDE CRYS ER 20 MEQ PO TBCR
40.0000 meq | EXTENDED_RELEASE_TABLET | Freq: Once | ORAL | Status: AC
  Administered 2014-03-26: 40 meq via ORAL
  Filled 2014-03-26: qty 2

## 2014-03-26 MED ORDER — INSULIN ASPART 100 UNIT/ML ~~LOC~~ SOLN
0.0000 [IU] | Freq: Three times a day (TID) | SUBCUTANEOUS | Status: DC
  Administered 2014-03-27: 2 [IU] via SUBCUTANEOUS
  Administered 2014-03-27 – 2014-03-28 (×2): 1 [IU] via SUBCUTANEOUS
  Administered 2014-03-28 (×2): 2 [IU] via SUBCUTANEOUS

## 2014-03-26 MED ORDER — SODIUM CHLORIDE 0.9 % IJ SOLN: 3.0000 mL | INTRAMUSCULAR | Status: DC | PRN

## 2014-03-26 MED ORDER — POTASSIUM CHLORIDE 10 MEQ/100ML IV SOLN
10.0000 meq | INTRAVENOUS | Status: AC
  Filled 2014-03-26 (×2): qty 100

## 2014-03-26 MED ORDER — APIXABAN 2.5 MG PO TABS
2.5000 mg | ORAL_TABLET | Freq: Two times a day (BID) | ORAL | Status: DC
  Administered 2014-03-26 – 2014-03-28 (×4): 2.5 mg via ORAL
  Filled 2014-03-26 (×6): qty 1

## 2014-03-26 MED ORDER — SODIUM CHLORIDE 0.9 % IJ SOLN
3.0000 mL | Freq: Two times a day (BID) | INTRAMUSCULAR | Status: DC
  Administered 2014-03-27: 3 mL via INTRAVENOUS

## 2014-03-26 NOTE — ED Notes (Signed)
Hospice nurse contacted this RN concerning disposition for patient; reports pt was placed on the Hospice list in April 2015. Hospice RN will follow up tomorrow about patient's care.

## 2014-03-26 NOTE — ED Notes (Signed)
Left message for wife concerning pt's disposition

## 2014-03-26 NOTE — ED Notes (Signed)
Dr.Xiu at bedside

## 2014-03-26 NOTE — H&P (Signed)
[facility] History and Physical  Justin Bohlman Sr. ZOX:[id number] DOB: April 05, 1946 DOA: 03/26/2014  Referring physician: ED physician PCP: [name], MD  Specialists:   Chief Complaint: sycope  HPI: Justin Tippen Sr. is a 68 y.o. male with past medical history of hypertension, diabetes-type II, CKD-IV, congestive heart failure, hyperlipidemia, who presents with a syncope episode.  At the baseline, patient is living with his wife at home;  most of time, he stays in the bed and does not move around much. He has been in his normal health condition until today. He went to his cardiologist office for routine checkup at about 2:00 PM. While he was and waiting and sitting in the chair, he suddenly started sweating diffusely and became less responsive. He did not seem to have passed out per his wife. He was noticed to have drooling from his mouth.   He did not have seizure episode, did not lose control of bladder or bowel moment, did not have weakness, or numbness in his extremities per his wife. He did have have facial droop or slurry speech. The episode lasted for about 10 minutes, then gradually recovered spontaneously. EMS was called and checked CBG, which was 200 per his wife.   Patient does not have fever, chills, chest pain, shortness of breath, abdominal pain, nausea, vomiting or diarrhea. No rashes. He has mild cough recently, which has not changed significantly today.  Of note, his Lasix was discontinued 5 month ago because of well controlled congestive heart failure, but recently resumed due to fluid retension on 03/23/14. He is taking 40 mg lasix daily currently. He is taking Novolog for his DM-II without basal coverage. He had a small meal in the AM and received 8 U of novolog at about 10:00 AM.   Patient was found to have potassium 2.9, negative CT-head for acute abnormality. Negative troponin, no leukocytosis.   Review of Systems: As presented in the history of presenting  illness, rest negative.  Where does patient live? Lives with his wife at home in [place]  Can patient participate in ADLs? None  Allergy:  Allergies  Allergen Reactions  . Ancef [Cefazolin] Itching and Rash  . Sulfa Antibiotics Itching and Rash    Past Medical History  Diagnosis Date  . Diabetes mellitus   . Hypertension   . Hyperlipidemia   . CHF (congestive heart failure)   . Enlarged heart   . Pneumonia     hx of   . Osteomyelitis of foot     LT  . CKD (chronic kidney disease), stage IV   . Shortness of breath   . GERD (gastroesophageal reflux disease)     Past Surgical History  Procedure Laterality Date  . Right leg fracture    . Skin graft    . Knee surgery    . Tee without cardioversion  03/17/2012    Procedure: TRANSESOPHAGEAL ECHOCARDIOGRAM (TEE);  Surgeon: Ricki Rodriguez, MD;  Location: [facility] ENDOSCOPY;  Service: Cardiovascular;  Laterality: N/A;  . Incision and drainage Left 07/02/2013    Procedure: INCISION AND DRAINAGE LEFT FOOT;  Surgeon: Kathryne Hitch, MD;  Location: [facility] ORS;  Service: Orthopedics;  Laterality: Left;  . Amputation Left 07/18/2013    Procedure: AMPUTATION BELOW KNEE;  Surgeon: Kathryne Hitch, MD;  Location: [facility] OR;  Service: Orthopedics;  Laterality: Left;    Social History:  reports that he quit smoking about 30 years ago. He has never used smokeless tobacco. He reports that he does not  drink alcohol or use illicit drugs.  Family History:  Family History  Problem Relation Age of Onset  . Heart attack Father   . Heart attack Mother   . Renal Disease Sister     one of his sisters needed hemodialysis  . Diabetes Brother      Prior to Admission medications   Medication Sig Start Date End Date Taking? Authorizing Provider  amLODipine (NORVASC) 5 MG tablet Take 5 mg by mouth daily.   Yes Historical Provider, MD  apixaban (ELIQUIS) 2.5 MG TABS tablet Take 2.5 mg by mouth 2 (two) times daily.   Yes Historical Provider, MD   carvedilol (COREG) 12.5 MG tablet Take 1 tablet (12.5 mg total) by mouth 2 (two) times daily with a meal. 03/14/13  Yes Robynn PaneMohan N Harwani, MD  cloNIDine (CATAPRES) 0.1 MG tablet Take 0.1 mg by mouth 3 (three) times daily.    Yes Historical Provider, MD  furosemide (LASIX) 40 MG tablet Take 40 mg by mouth daily.   Yes Historical Provider, MD  hydrOXYzine (ATARAX/VISTARIL) 10 MG tablet Take 10 mg by mouth 3 (three) times daily as needed for itching.   Yes Historical Provider, MD  insulin aspart (NOVOLOG) 100 UNIT/ML injection Inject into the skin 3 (three) times daily with meals. SSI 200-300   7 UNITS; 301-400  9 UNITS; 401-500   12  UNITS; > 500 14  UNITS AND CALL MD   CBG AC AND QHS 07/04/13  Yes Alison MurrayAlma M Devine, MD  Multiple Vitamins-Minerals (CENTRUM SILVER ADULT 50+) TABS Take 0.5 tablets by mouth daily.   Yes Historical Provider, MD  ondansetron (ZOFRAN) 4 MG tablet Take 1 tablet (4 mg total) by mouth every 6 (six) hours as needed for nausea. 07/04/13  Yes Alison MurrayAlma M Devine, MD  Tamsulosin HCl (FLOMAX) 0.4 MG CAPS Take 1 capsule (0.4 mg total) by mouth daily. 04/01/12  Yes Robynn PaneMohan N Harwani, MD    Physical Exam: Filed Vitals:   03/26/14 1900 03/26/14 1927 03/26/14 2016 03/26/14 2048  BP: 164/74 164/74 162/76 168/84  Pulse: 74 74 76 73  Temp:   98.7 F (37.1 C) 98.6 F (37 C)  TempSrc:   Oral Oral  Resp: 20 20 18 17   Height:    5\' 10"  (1.778 m)  Weight:    97.1 kg (214 lb 1.1 oz)  SpO2: 98% 97% 98% 97%   General: Not in acute distress HEENT:       Eyes: PERRL, EOMI, no scleral icterus       ENT: No discharge from the ears and nose, no pharynx injection, no tonsillar enlargement.        Neck: No JVD, no bruit, no mass felt. Cardiac: S1/S2, RRR, No murmurs, gallops or rubs Pulm: Good air movement bilaterally. Clear to auscultation bilaterally. No rales, wheezing, rhonchi or rubs. Abd: Soft, nondistended, nontender, no rebound pain, no organomegaly, BS present Ext: 1+ pitting leg edema on the  right side (left BAK), 2+DP/PT pulse on the right side Musculoskeletal: No joint deformities, erythema, or stiffness, ROM full Skin: No rashes.  Neuro: Alert and oriented X3, cranial nerves II-XII grossly intact, muscle strength 5/5 in all extremeties, sensation to light touch intact. Brachial reflex 2+ bilaterally. Knee reflex 1+ on the right side. Negative Babinski's sign. Normal finger to nose test. Psych: Patient is not psychotic, no suicidal or hemocidal ideation.  Labs on Admission:  Basic Metabolic Panel:  Recent Labs Lab 03/26/14 1516  NA 147  K 2.9*  CL  108  CO2 28  GLUCOSE 121*  BUN 10  CREATININE 1.35  CALCIUM 7.9*   Liver Function Tests: No results found for this basename: AST, ALT, ALKPHOS, BILITOT, PROT, ALBUMIN,  in the last 168 hours No results found for this basename: LIPASE, AMYLASE,  in the last 168 hours No results found for this basename: AMMONIA,  in the last 168 hours CBC:  Recent Labs Lab 03/26/14 1516  WBC 8.0  HGB 10.9*  HCT 31.8*  MCV 76.3*  PLT 251   Cardiac Enzymes:  Recent Labs Lab 03/26/14 2232  TROPONINI <0.30    BNP (last 3 results)  Recent Labs  07/15/13 1659 09/05/13 1510  PROBNP 3108.0* 10229.0*   CBG:  Recent Labs Lab 03/26/14 1641 03/26/14 2040 03/27/14 0019  GLUCAP 122* 117* 109*    Radiological Exams on Admission: Ct Head Wo Contrast  03/26/2014   CLINICAL DATA:  Near syncopal episode. ; history of CHF diabetes and chronic renal insufficiency  EXAM: CT HEAD WITHOUT CONTRAST  TECHNIQUE: Contiguous axial images were obtained from the base of the skull through the vertex without intravenous contrast.  COMPARISON:  Noncontrast CT scan of the brain of July 15, 2013  FINDINGS: There is mild diffuse cerebral atrophy with compensatory ventriculomegaly. There are stable basal ganglia calcifications bilaterally. There is no acute intracranial hemorrhage nor evidence of acute ischemic change. The cerebellum and brainstem  exhibit no acute abnormalities. There are stable faint calcifications within both cerebral hemispheres.  The observed paranasal sinuses and mastoid air cells are clear. There is no acute skull fracture.  IMPRESSION: There is no acute intracranial abnormality. There are mild stable atrophic changes and intracranial calcifications as described. There is no acute skull fracture.   Electronically Signed   By: David  Swazil[place]Jordan   On: 03/26/2014 16:45   Dg Chest Port 1 View  03/26/2014   CLINICAL DATA:  Syncopal episode and shortness of breath ; history of CHF, diabetes, and chronic renal failure.  EXAM: PORTABLE CHEST - 1 VIEW  COMPARISON:  PA and lateral chest x-ray of September 06, 2013.  FINDINGS: The lungs are adequately inflated. There is a small right-sided pleural effusion. The cardiac silhouette is top-normal in size. The pulmonary vascularity is not engorged. There is stable deviation of the trachea towards the right. The observed portions of the bony thorax are unremarkable.  IMPRESSION: There is a small right pleural effusion not greatly changed from the previous study. There is stable deviation of the trachea toward the right. There is no acute cardiopulmonary abnormality.   Electronically Signed   By: David  [place]   On: 03/26/2014 15:50    EKG: Independently reviewed.   Assessment/Plan Principal Problem:   Syncope Active Problems:   Chronic diastolic CHF (congestive heart failure)   Hypertension   DM (diabetes mellitus), type 2, uncontrolled, periph vascular complic   CKD (chronic kidney disease), stage IV   HLD (hyperlipidemia)   History of pulmonary embolism   # Syncope: Etiology is not completely clear, 4 possibilities need to be considered. He could have orthostatic status given recently resumed Lasix; hypoglycemia was possible given his Novolog use and small meal he took in the AM; cardiac arrhythmia given his hx of CHF and hypokalemia with potassium 2.9; and vasovagal syncope. Other  differential diagnoses include, but less likely, TIA/stroke (less likely given no any symptoms of weakness, slurry speech or facial droop per his wife), seizure disorder (no seizure episode was noticed), PE (has hx of PE, but  on Eliquis).   - will admit to telemetry bed - hold lasix tonight given possible orthostatic status and his volume status is OK - repeated potassium: 40 mEq of KCl x 2 by oral and 10 mEq of KCl x2 by IV.  - check BMP and Mg in AM - trop x 3 - repeat EKG in am - Pt/ot  # CHF: patient has mild fluid overload, but given his possible orthostatic status due to recently resumed lasix, I will hold his lasix tonight - hold lasix - check proBNP - Check orthostatics vital signs  # dm-ii: A1c was 8.0 on 07/16/13. Patient is taking NovoLog only at home without basal coverage. -ssi  # CKD-IV: BL cre is 1.4 to 2.0. His creatinine is at his baseline on admission. -Followup renal function by BMP  # hx of PE: diagnosed in March 2015. On Eliquis at home, without bleeding tendency. No chest pain on admission -will continue Eliquis  # HTN: continue home meds except for lasix  DVT ppx: on Eliquis, SCD  Code Status: Full code (short term intubation is OK, but no long term intubation) Family Communication: Yes, patient's wife at bed side Disposition Plan: Admit to inpatient   Date of Service 03/27/2014    Lorretta Harp [facility] Pager [phone]  If 7PM-7AM, please contact night-coverage [email or web address] Password [email or web address] 03/27/2014, 12:30 AM

## 2014-03-26 NOTE — ED Provider Notes (Signed)
CSN: [id number]     Arrival date & time 03/26/14  1407 History   First MD Initiated Contact with Patient 03/26/14 1412     Chief Complaint  Patient presents with  . Near Syncope     (Consider location/radiation/quality/duration/timing/severity/associated sxs/prior Treatment) Patient is a 68 y.o. male presenting with near-syncope.  Near Syncope This is a new problem. The current episode started today. The problem occurs constantly. The problem has been resolved. Associated symptoms include nausea and vomiting. Pertinent negatives include no abdominal pain, arthralgias, change in bowel habit, chest pain, chills, congestion, coughing, fever, headaches, myalgias or sore throat. Associated symptoms comments: unresponsive. Nothing aggravates the symptoms. He has tried nothing for the symptoms.    Past Medical History  Diagnosis Date  . Diabetes mellitus   . Hypertension   . Hyperlipidemia   . CHF (congestive heart failure)   . Enlarged heart   . Pneumonia     hx of   . Osteomyelitis of foot     LT  . CKD (chronic kidney disease), stage IV    Past Surgical History  Procedure Laterality Date  . Right leg fracture    . Skin graft    . Knee surgery    . Tee without cardioversion  03/17/2012    Procedure: TRANSESOPHAGEAL ECHOCARDIOGRAM (TEE);  Surgeon: Ricki RodriguezAjay S Kadakia, MD;  [facility];  Service: Cardiovascular;  Laterality: N/A;  . Incision and drainage Left 07/02/2013    Procedure: INCISION AND DRAINAGE LEFT FOOT;  Surgeon: Kathryne Hitchhristopher Y Blackman, MD;  Location: [facility] ORS;  Service: Orthopedics;  Laterality: Left;  . Amputation Left 07/18/2013    Procedure: AMPUTATION BELOW KNEE;  Surgeon: Kathryne Hitchhristopher Y Blackman, MD;  [facility];  Service: Orthopedics;  Laterality: Left;   No family history on file. History  Substance Use Topics  . Smoking status: Former Smoker    Quit date: 03/10/1984  . Smokeless tobacco: Never Used  . Alcohol Use: No    Review of Systems    Constitutional: Negative for fever and chills.  HENT: Negative for congestion and sore throat.   Eyes: Negative for visual disturbance.  Respiratory: Negative for cough, shortness of breath and wheezing.   Cardiovascular: Positive for near-syncope. Negative for chest pain.  Gastrointestinal: Positive for nausea and vomiting. Negative for abdominal pain, diarrhea, constipation and change in bowel habit.  Genitourinary: Negative for dysuria and difficulty urinating.  Musculoskeletal: Negative for arthralgias and myalgias.  Skin: Negative for wound.  Neurological: Positive for light-headedness. Negative for syncope and headaches.  Psychiatric/Behavioral: Negative for behavioral problems.  All other systems reviewed and are negative.     Allergies  Ancef and Sulfa antibiotics  Home Medications   Prior to Admission medications   Medication Sig Start Date End Date Taking? Authorizing Provider  amLODipine (NORVASC) 5 MG tablet Take 5 mg by mouth daily.   Yes Historical Provider, MD  apixaban (ELIQUIS) 2.5 MG TABS tablet Take 2.5 mg by mouth 2 (two) times daily.   Yes Historical Provider, MD  carvedilol (COREG) 12.5 MG tablet Take 1 tablet (12.5 mg total) by mouth 2 (two) times daily with a meal. 03/14/13  Yes Robynn PaneMohan N Harwani, MD  cloNIDine (CATAPRES) 0.1 MG tablet Take 0.1 mg by mouth 3 (three) times daily.    Yes Historical Provider, MD  furosemide (LASIX) 40 MG tablet Take 40 mg by mouth daily.   Yes Historical Provider, MD  hydrOXYzine (ATARAX/VISTARIL) 10 MG tablet Take 10 mg by mouth 3 (three) times  daily as needed for itching.   Yes Historical Provider, MD  insulin aspart (NOVOLOG) 100 UNIT/ML injection Inject into the skin 3 (three) times daily with meals. SSI 200-300   7 UNITS; 301-400  9 UNITS; 401-500   12  UNITS; > 500 14  UNITS AND CALL MD   CBG AC AND QHS 07/04/13  Yes Alison MurrayAlma M Devine, MD  Multiple Vitamins-Minerals (CENTRUM SILVER ADULT 50+) TABS Take 0.5 tablets by mouth daily.    Yes Historical Provider, MD  ondansetron (ZOFRAN) 4 MG tablet Take 1 tablet (4 mg total) by mouth every 6 (six) hours as needed for nausea. 07/04/13  Yes Alison MurrayAlma M Devine, MD  Tamsulosin HCl (FLOMAX) 0.4 MG CAPS Take 1 capsule (0.4 mg total) by mouth daily. 04/01/12  Yes Robynn PaneMohan N Harwani, MD   BP 173/79  Pulse 71  Temp(Src) 97.5 F (36.4 C) (Oral)  Resp 18  Ht 5\' 10"  (1.778 m)  Wt 210 lb (95.255 kg)  BMI 30.13 kg/m2  SpO2 98% Physical Exam  Vitals reviewed. Constitutional: He is oriented to person, place, and time. No distress.  HENT:  Head: Normocephalic and atraumatic.  Eyes: EOM are normal. Pupils are equal, round, and reactive to light.  Neck: Normal range of motion.  Cardiovascular: Normal rate, regular rhythm and normal heart sounds.   Pulmonary/Chest: Effort normal and breath sounds normal. No respiratory distress.  Abdominal: Soft. Bowel sounds are normal. He exhibits no distension. There is no tenderness.  Genitourinary: Right testis shows no tenderness. Left testis shows no tenderness. Uncircumcised. Penile erythema present.  Penis and scrotum mildly swollen  Musculoskeletal: He exhibits no edema.  bka  Neurological: He is alert and oriented to person, place, and time. GCS eye subscore is 4. GCS verbal subscore is 5. GCS motor subscore is 6.  Skin: He is not diaphoretic. There is erythema.       ED Course  Procedures (including critical care time) Labs Review Labs Reviewed  CBC - Abnormal; Notable for the following:    RBC 4.17 (*)    Hemoglobin 10.9 (*)    HCT 31.8 (*)    MCV 76.3 (*)    All other components within normal limits  BASIC METABOLIC PANEL  URINALYSIS, ROUTINE W REFLEX MICROSCOPIC  I-STAT TROPOININ, ED  I-STAT CG4 LACTIC ACID, ED  CBG MONITORING, ED  I-STAT CG4 LACTIC ACID, ED    Imaging Review Dg Chest Port 1 View  03/26/2014   CLINICAL DATA:  Syncopal episode and shortness of breath ; history of CHF, diabetes, and chronic renal failure.  EXAM:  PORTABLE CHEST - 1 VIEW  COMPARISON:  PA and lateral chest x-ray of September 06, 2013.  FINDINGS: The lungs are adequately inflated. There is a small right-sided pleural effusion. The cardiac silhouette is top-normal in size. The pulmonary vascularity is not engorged. There is stable deviation of the trachea towards the right. The observed portions of the bony thorax are unremarkable.  IMPRESSION: There is a small right pleural effusion not greatly changed from the previous study. There is stable deviation of the trachea toward the right. There is no acute cardiopulmonary abnormality.   Electronically Signed   By: David  [place]   On: 03/26/2014 15:50     EKG Interpretation   Date/Time:  Monday March 26 2014 14:10:00 EDT Ventricular Rate:  74 PR Interval:  155 QRS Duration: 110 QT Interval:  456 QTC Calculation: 506 R Axis:   46 Text Interpretation:  Normal sinus rhythm T wave  inversion Anterolateral  leads No significant change since last tracing 08/26/13 Confirmed by RAY  MD, Duwayne Heck [phone]) on 03/26/2014 2:21:16 PM      MDM   Final diagnoses:  Syncope  Non-intractable vomiting with nausea, vomiting of unspecified type    Patient is a 68 year old male history of congestive heart failure, diabetes, hypertension, BKA the left lower extremity that presents from the cardiology office after an episode of unresponsiveness, nausea, vomiting. Patient's wife states the patient was staring off into space for several minutes and then began drooling and vomited and then return to baseline approximately 15 minutes after onset. EMS arrived blood sugar was 154 the patient was alert and oriented x4. On route to the ED patient was alert and denies any complaints at this time. Patient denied any chest pain during this episode. Patient's EKG showed some lateral T-wave inversion however this was present in March 2015. Patient is afebrile stable vital signs. Patient is taking Eliquis for PE. Patient denies  headache. We will CT the patient's head. We'll check labs as well as urinalysis. Patient's wife states that he's been using a condom catheter recently and is concerned that he has a infection of his penis. On exam patient has been given his penis however listening is more secondary to fluid collection in a dependent area instead of infection. The patient does have mild skin breakdown to his sacral area that may be infected. Pt's care transferred to Dr. Marcha Solders at 4:10 PM. Plan is to f/u labs and imaging.  Pt likely being admitted for further evaluation.    Beverely Risen, MD 03/26/14 [phone]

## 2014-03-26 NOTE — ED Notes (Signed)
Pt refusing blood draw, Ray,  MD  notified

## 2014-03-26 NOTE — ED Notes (Signed)
Dr.Brtalik at bedside  

## 2014-03-26 NOTE — ED Notes (Signed)
Pt in from [place], per report pt was there for yrly cards check up when the pt c/o near syncope event, denies LOC,pt diaphoretic, denies, CP, SOB, c/o nausea,denies diarrhea,c/o x 1 vomiting episode, pt A&O x4, follows commands, speaks in complete sentences

## 2014-03-26 NOTE — ED Notes (Signed)
Pt's wife at bedside; MD made aware

## 2014-03-26 NOTE — ED Notes (Signed)
Pt to ED from annual cardiology appointment. Denies complaints. While at visit in Dr.Harwani's office, pt had near syncopal episode associated with diaphoresis, nausea and one episode of vomiting. Pt does not remember event; reports going to doctor's office, vomiting, then being transported to ED.

## 2014-03-26 NOTE — ED Notes (Signed)
Phlebotomy notified for blood draw, per MD pt agrees to lab work

## 2014-03-26 NOTE — ED Notes (Signed)
Spoke to Ross Storesloria Rosenzweig, pts wife; wife is on the way

## 2014-03-26 NOTE — ED Notes (Signed)
Attempted to contact pt's wife per pt request regarding disposition; unable to contact

## 2014-03-26 NOTE — ED Provider Notes (Signed)
68 y.o. Chronically ill patient with episode of unresponsiveness today at Dr.Harwani's office lasting 10-15 minutes.  Wife reports he has been at baseline for several weeks.  Patient denies complaints here except wants to be left alone.  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 Skin break down sacral area   EKG Interpretation  Date/Time:  Monday March 26 2014 14:10:00 EDT Ventricular Rate:  74 PR Interval:  155 QRS Duration: 110 QT Interval:  456 QTC Calculation: 506 R Axis:   46 Text Interpretation:  Normal sinus rhythm T wave inversion Anterolateral leads No significant change since last tracing 08/26/13 Confirmed by Kianah Harries MD, Duwayne HeckANIELLE [phone] on 03/26/2014 2:21:16 PM      I saw and evaluated the patient, reviewed the resident's note and I agree with the findings and plan.   EKG Interpretation   Date/Time:  Monday March 26 2014 14:10:00 EDT Ventricular Rate:  74 PR Interval:  155 QRS Duration: 110 QT Interval:  456 QTC Calculation: 506 R Axis:   46 Text Interpretation:  Normal sinus rhythm T wave inversion Anterolateral  leads No significant change since last tracing 08/26/13 Confirmed by Phelan Goers  MD, Duwayne HeckANIELLE [phone] on 03/26/2014 2:21:16 PM      Patient signed out to Dr.Pickering.    Hilario Quarryanielle S Sirius Woodford, MD 03/26/14 [phone]

## 2014-03-27 DIAGNOSIS — R55 Syncope and collapse: Principal | ICD-10-CM

## 2014-03-27 DIAGNOSIS — E876 Hypokalemia: Secondary | ICD-10-CM

## 2014-03-27 LAB — BASIC METABOLIC PANEL
Anion gap: 10 (ref 5–15)
BUN: 10 mg/dL (ref 6–23)
CHLORIDE: 110 meq/L (ref 96–112)
CO2: 28 meq/L (ref 19–32)
CREATININE: 1.4 mg/dL — AB (ref 0.50–1.35)
Calcium: 7.8 mg/dL — ABNORMAL LOW (ref 8.4–10.5)
GFR calc Af Amer: 58 mL/min — ABNORMAL LOW (ref >90)
GFR calc non Af Amer: 50 mL/min — ABNORMAL LOW (ref >90)
GLUCOSE: 124 mg/dL — AB (ref 70–99)
Potassium: 3.1 mEq/L — ABNORMAL LOW (ref 3.7–5.3)
Sodium: 148 mEq/L — ABNORMAL HIGH (ref 137–147)

## 2014-03-27 LAB — URINALYSIS, ROUTINE W REFLEX MICROSCOPIC
Bilirubin Urine: NEGATIVE
Glucose, UA: NEGATIVE mg/dL
Ketones, ur: NEGATIVE mg/dL
NITRITE: NEGATIVE
Protein, ur: >300 mg/dL — AB
Specific Gravity, Urine: 1.016 (ref 1.005–1.030)
UROBILINOGEN UA: 0.2 mg/dL (ref 0.0–1.0)
pH: 6 (ref 5.0–8.0)

## 2014-03-27 LAB — URINE MICROSCOPIC-ADD ON

## 2014-03-27 LAB — MRSA PCR SCREENING: MRSA by PCR: POSITIVE — AB

## 2014-03-27 LAB — TROPONIN I: Troponin I: <0.3 ng/mL (ref <0.30)

## 2014-03-27 LAB — GLUCOSE, CAPILLARY
GLUCOSE-CAPILLARY: 108 mg/dL — AB (ref 70–99)
GLUCOSE-CAPILLARY: 109 mg/dL — AB (ref 70–99)
Glucose-Capillary: 123 mg/dL — ABNORMAL HIGH (ref 70–99)
Glucose-Capillary: 167 mg/dL — ABNORMAL HIGH (ref 70–99)
Glucose-Capillary: 160 mg/dL — ABNORMAL HIGH (ref 70–99)

## 2014-03-27 MED ORDER — POTASSIUM CHLORIDE CRYS ER 20 MEQ PO TBCR
40.0000 meq | EXTENDED_RELEASE_TABLET | ORAL | Status: AC
  Administered 2014-03-27 – 2014-03-28 (×3): 40 meq via ORAL
  Filled 2014-03-27 (×2): qty 2

## 2014-03-27 NOTE — Discharge Instructions (Signed)
Near-Syncope Near-syncope (commonly known as near fainting) is sudden weakness, dizziness, or feeling like you might pass out. During an episode of near-syncope, you may also develop pale skin, have tunnel vision, or feel sick to your stomach (nauseous). Near-syncope may occur when getting up after sitting or while standing for a long time. It is caused by a sudden decrease in blood flow to the brain. This decrease can result from various causes or triggers, most of which are not serious. However, because near-syncope can sometimes be a sign of something serious, a medical evaluation is required. The specific cause is often not determined. HOME CARE INSTRUCTIONS  Monitor your condition for any changes. The following actions may help to alleviate any discomfort you are experiencing:  Have someone stay with you until you feel stable.  Lie down right away and prop your feet up if you start feeling like you might faint. Breathe deeply and steadily. Wait until all the symptoms have passed. Most of these episodes last only a few minutes. You may feel tired for several hours.   Drink enough fluids to keep your urine clear or pale yellow.   If you are taking blood pressure or heart medicine, get up slowly when seated or lying down. Take several minutes to sit and then stand. This can reduce dizziness.  Follow up with your health care provider as directed. SEEK IMMEDIATE MEDICAL CARE IF:   You have a severe headache.   You have unusual pain in the chest, abdomen, or back.   You are bleeding from the mouth or rectum, or you have black or tarry stool.   You have an irregular or very fast heartbeat.   You have repeated fainting or have seizure-like jerking during an episode.   You faint when sitting or lying down.   You have confusion.   You have difficulty walking.   You have severe weakness.   You have vision problems.  MAKE SURE YOU:   Understand these instructions.  Will  watch your condition.  Will get help right away if you are not doing well or get worse. Document Released: 05/25/2005 Document Revised: 05/30/2013 Document Reviewed: 10/28/2012 [facility] Patient Information 2015 [employer] [place]. This information is not intended to replace advice given to you by your health care provider. Make sure you discuss any questions you have with your health care provider. ________________________ Information on my medicine - ELIQUIS (apixaban)  This medication education was reviewed with me or my healthcare representative as part of my discharge preparation.    Why was Eliquis prescribed for you? Eliquis was prescribed for your history of blood clot in your lung and to reduce the risk of forming blood clots.  What do You need to know about Eliquis ? Take your Eliquis TWICE DAILY - one tablet in the morning and one tablet in the evening with or without food.  It would be best to take the doses about the same time each day.  If you have difficulty swallowing the tablet whole please discuss with your pharmacist how to take the medication safely.  Take Eliquis exactly as prescribed by your doctor and DO NOT stop taking Eliquis without talking to the doctor who prescribed the medication.  Stopping may increase your risk of developing a new clot or stroke.  Refill your prescription before you run out.  After discharge, you should have regular check-up appointments with your healthcare provider that is prescribing your Eliquis.  In the future your dose may need to  be changed if your kidney function or weight changes by a significant amount or as you get older.  What do you do if you miss a dose? If you miss a dose, take it as soon as you remember on the same day and resume taking twice daily.  Do not take more than one dose of ELIQUIS at the same time.  Important Safety Information A possible side effect of Eliquis is bleeding. You should call your healthcare  provider right away if you experience any of the following:   Bleeding from an injury or your nose that does not stop.   Unusual colored urine (red or dark brown) or unusual colored stools (red or black).   Unusual bruising for unknown reasons.   A serious fall or if you hit your head (even if there is no bleeding).  Some medicines may interact with Eliquis and might increase your risk of bleeding or clotting while on Eliquis. To help avoid this, consult your healthcare provider or pharmacist prior to using any new prescription or non-prescription medications, including herbals, vitamins, non-steroidal anti-inflammatory drugs (NSAIDs) and supplements.  This website has more information on Eliquis (apixaban): [email or web address].

## 2014-03-27 NOTE — Progress Notes (Signed)
Patient Demographics  [name], is a 68 y.o. male, DOB - 05/19/1946, ZOX:[id number]  Admit date - 03/26/2014   Admitting Physician Lorretta HarpXilin Niu, MD  Outpatient Primary MD for the patient is Evlyn CourierHILL,[name], MD  LOS - 1   Chief Complaint  Patient presents with  . Near Syncope      Brief narrative: Patient presents with presyncope episodes at his cardiology office, no loss of consciousness or seizure activity, workup was significant for hypokalemia, patient was recently resumed on Lasix as well. Patient is being followed by hospice at home secondary to his congestive heart failure.  Subjective:   [name] today has, No headache, No chest pain, No abdominal pain - No Nausea, No new weakness tingling or numbness, No Cough - SOB.   Assessment & Plan    Principal Problem:   Syncope Active Problems:   Chronic diastolic CHF (congestive heart failure)   Hypertension   DM (diabetes mellitus), type 2, uncontrolled, periph vascular complic   CKD (chronic kidney disease), stage IV   HLD (hyperlipidemia)   History of pulmonary embolism  PreSyncope:  -Etiology is unclear, most likely related to hypokalemia, versus vasovagal, and his recent resumption of Lasix. -Continue to hold Lasix. -Monitor on telemetry -Replace potassium, check magnesium level. -PT/OT consult. - Not orthostatic.  hypokalemia -replace, recheck in am.  CHF:  -patient has mild fluid overload, but given his possible orthostatic status due to recently resumed lasix- hold lasix  - check proBNP    Diabetes mellitus - A1c was 8.0 on 07/16/13.  -Patient is taking NovoLog only at home without basal coverage.  -Continue with insulin sliding scale   CKD-IV: - At baseline -Followup BMP closely   hx of PE:  -diagnosed in March 2015. On Eliquis at home. -will continue Eliquis   HTN:    -continue home meds except for lasix   DVT ppx: on Eliquis, SCD     Code Status: Full code  Family Communication: No family at bedside  Disposition Plan: PT/OT , home in 24 hours.   Procedures  none   Consults   none      Medications  Scheduled Meds: . amLODipine  5 mg Oral Daily  . apixaban  2.5 mg Oral BID  . aspirin  325 mg Oral Daily  . carvedilol  12.5 mg Oral BID WC  . cloNIDine  0.1 mg Oral TID  . guaiFENesin  600 mg Oral BID  . insulin aspart  0-9 Units Subcutaneous TID WC  . multivitamin with minerals  1 tablet Oral Daily  . potassium chloride  40 mEq Oral Once  . potassium chloride  40 mEq Oral Q4H  . sodium chloride  3 mL Intravenous Q12H  . sodium chloride  3 mL Intravenous Q12H  . tamsulosin  0.4 mg Oral Daily   Continuous Infusions:  PRN Meds:.sodium chloride, acetaminophen, acetaminophen, hydrOXYzine, ondansetron, sodium chloride  DVT Prophylaxis  Eliquis -SCDs   Lab Results  Component Value Date   PLT 251 03/26/2014    Antibiotics    Anti-infectives   None          Objective:   Filed Vitals:   03/26/14 2048 03/27/14 0535 03/27/14 0800 03/27/14 0932  BP: 168/84 156/87 178/85  175/85  Pulse: 73 81 88 80  Temp: 98.6 F (37 C) 98.3 F (36.8 C) 98.2 F (36.8 C) 98.2 F (36.8 C)  TempSrc: Oral Oral Oral Oral  Resp: 17 18 18 18   Height: 5\' 10"  (1.778 m)     Weight: 97.1 kg (214 lb 1.1 oz)     SpO2: 97% 97% 96% 96%    Wt Readings from Last 3 Encounters:  03/26/14 97.1 kg (214 lb 1.1 oz)  09/11/13 104.463 kg (230 lb 4.8 oz)  07/19/13 99.565 kg (219 lb 8 oz)     Intake/Output Summary (Last 24 hours) at 03/27/14 1432 Last data filed at 03/27/14 0541  Gross per 24 hour  Intake      0 ml  Output    300 ml  Net   -300 ml     Physical Exam  Awake Alert, Oriented X 3, No new F.N deficits, Normal affect Liberal.AT,PERRAL Supple Neck,No JVD, No cervical lymphadenopathy appriciated.  Symmetrical Chest wall movement, Good air  movement bilaterally, CTAB RRR,No Gallops,Rubs or new Murmurs, No Parasternal Heave +ve B.Sounds, Abd Soft, No tenderness, No organomegaly appriciated, No rebound - guarding or rigidity. No Cyanosis, Clubbing , No new Rash or bruise  , left BKA, +1 edema right lower extremity   Data Review   Micro Results Recent Results (from the past 240 hour(s))  MRSA PCR SCREENING     Status: Abnormal   Collection Time    03/27/14  9:04 AM      Result Value Ref Range Status   MRSA by PCR POSITIVE (*) NEGATIVE Final   Comment:            The GeneXpert MRSA Assay (FDA     approved for NASAL specimens     only), is one component of a     comprehensive MRSA colonization     surveillance program. It is not     intended to diagnose MRSA     infection nor to guide or     monitor treatment for     MRSA infections.     RESULT CALLED TO, READ BACK BY AND VERIFIED WITH:     J. NARAMBAS 10:20 03/27/14 ([email or web address])    Radiology Reports Ct Head Wo Contrast  03/26/2014   CLINICAL DATA:  Near syncopal episode. ; history of CHF diabetes and chronic renal insufficiency  EXAM: CT HEAD WITHOUT CONTRAST  TECHNIQUE: Contiguous axial images were obtained from the base of the skull through the vertex without intravenous contrast.  COMPARISON:  Noncontrast CT scan of the brain of July 15, 2013  FINDINGS: There is mild diffuse cerebral atrophy with compensatory ventriculomegaly. There are stable basal ganglia calcifications bilaterally. There is no acute intracranial hemorrhage nor evidence of acute ischemic change. The cerebellum and brainstem exhibit no acute abnormalities. There are stable faint calcifications within both cerebral hemispheres.  The observed paranasal sinuses and mastoid air cells are clear. There is no acute skull fracture.  IMPRESSION: There is no acute intracranial abnormality. There are mild stable atrophic changes and intracranial calcifications as described. There is no acute skull fracture.    Electronically Signed   By: David  [place]   On: 03/26/2014 16:45   Dg Chest Port 1 View  03/26/2014   CLINICAL DATA:  Syncopal episode and shortness of breath ; history of CHF, diabetes, and chronic renal failure.  EXAM: PORTABLE CHEST - 1 VIEW  COMPARISON:  PA and lateral chest x-ray of September 06, 2013.  FINDINGS:  The lungs are adequately inflated. There is a small right-sided pleural effusion. The cardiac silhouette is top-normal in size. The pulmonary vascularity is not engorged. There is stable deviation of the trachea towards the right. The observed portions of the bony thorax are unremarkable.  IMPRESSION: There is a small right pleural effusion not greatly changed from the previous study. There is stable deviation of the trachea toward the right. There is no acute cardiopulmonary abnormality.   Electronically Signed   By: David  [place]   On: 03/26/2014 15:50    CBC  Recent Labs Lab 03/26/14 1516  WBC 8.0  HGB 10.9*  HCT 31.8*  PLT 251  MCV 76.3*  MCH 26.1  MCHC 34.3  RDW 14.4    Chemistries   Recent Labs Lab 03/26/14 1516 03/27/14 1055  NA 147 148*  K 2.9* 3.1*  CL 108 110  CO2 28 28  GLUCOSE 121* 124*  BUN 10 10  CREATININE 1.35 1.40*  CALCIUM 7.9* 7.8*   ------------------------------------------------------------------------------------------------------------------ estimated creatinine clearance is 58.2 ml/min (by C-G formula based on Cr of 1.4). ------------------------------------------------------------------------------------------------------------------ No results found for this basename: HGBA1C,  in the last 72 hours ------------------------------------------------------------------------------------------------------------------ No results found for this basename: CHOL, HDL, LDLCALC, TRIG, CHOLHDL, LDLDIRECT,  in the last 72 hours ------------------------------------------------------------------------------------------------------------------ No results  found for this basename: TSH, T4TOTAL, FREET3, T3FREE, THYROIDAB,  in the last 72 hours ------------------------------------------------------------------------------------------------------------------ No results found for this basename: VITAMINB12, FOLATE, FERRITIN, TIBC, IRON, RETICCTPCT,  in the last 72 hours  Coagulation profile No results found for this basename: INR, PROTIME,  in the last 168 hours  No results found for this basename: DDIMER,  in the last 72 hours  Cardiac Enzymes  Recent Labs Lab 03/26/14 2232 03/27/14 0311  TROPONINI <0.30 <0.30   ------------------------------------------------------------------------------------------------------------------ No components found with this basename: POCBNP,      Time Spent in minutes   25 minutes   Keahi Mccarney M.D on 03/27/2014 at 2:32 PM  Between 7am to 7pm - Pager - [phone]  After 7pm go to [email or web address] - password [email or web address]  And look for the night coverage person covering for me after hours  [facility] Office  [phone]   **Disclaimer: This note may have been dictated with voice recognition software. Similar sounding words can inadvertently be transcribed and this note may contain transcription errors which may not have been corrected upon publication of note.**

## 2014-03-27 NOTE — Progress Notes (Signed)
UR completed 

## 2014-03-27 NOTE — Progress Notes (Signed)
[facility] Social Work note Patient is currently receiving hospice care for dx of heart failure. He lives at home with his wife, Malachi BondsGloria who is the pcg. He does spend the majority of his time in bed due left BKA and family can only get him up occasionally with max assist. He is weak and needs assistance with all ADLs and IADLs. He desires a return home, yet voiced fears that he would end up in [facility]. His wife was not present to discuss concerns. Patient has oof DNR at home. Patient questioned why he felt so weak and that it was most problematic for him. He has shared ongoing desires to get up and move about. LCSW offered support, normalized his frustrations related to limits posed by illness. Orson GearMelanie Fuqua, Kentucky[place]CSW          [phone]

## 2014-03-27 NOTE — Evaluation (Signed)
Clinical/Bedside Swallow Evaluation Patient Details  Name: Justin HansenDonald Bertucci Sr. MRN: [id number] Date of Birth: 03/07/1946  Today's Date: 03/27/2014 Time: [id number] SLP Time Calculation (min): 17 min  Past Medical History:  Past Medical History  Diagnosis Date  . Diabetes mellitus   . Hypertension   . Hyperlipidemia   . CHF (congestive heart failure)   . Enlarged heart   . Pneumonia     hx of   . Osteomyelitis of foot     LT  . CKD (chronic kidney disease), stage IV   . Shortness of breath   . GERD (gastroesophageal reflux disease)    Past Surgical History:  Past Surgical History  Procedure Laterality Date  . Right leg fracture    . Skin graft    . Knee surgery    . Tee without cardioversion  03/17/2012    Procedure: TRANSESOPHAGEAL ECHOCARDIOGRAM (TEE);  Surgeon: Ricki RodriguezAjay S Kadakia, MD;  [facility];  Service: Cardiovascular;  Laterality: N/A;  . Incision and drainage Left 07/02/2013    Procedure: INCISION AND DRAINAGE LEFT FOOT;  Surgeon: Kathryne Hitchhristopher Y Blackman, MD;  Location: [facility] ORS;  Service: Orthopedics;  Laterality: Left;  . Amputation Left 07/18/2013    Procedure: AMPUTATION BELOW KNEE;  Surgeon: Kathryne Hitchhristopher Y Blackman, MD;  [facility];  Service: Orthopedics;  Laterality: Left;   HPI:  68 y.o. male with past medical history of hypertension, AKA, diabetes-type II, CKD-IV, congestive heart failure, hyperlipidemia admitted with a syncope episode.  Patient was found to have potassium 2.9, negative CT-head for acute abnormality. CXR revealedhere small right pleural effusion and stable deviation of the trachea toward the right. There is no acute cardiopulmonary abnormality.    Assessment / Plan / Recommendation Clinical Impression  Pt.' s wife reported he coughs frequently after liquids at home.  S/S aspiration present with thin, suspect delayed swallow initiation with decreased pt. awareness (does not think he has difficulty swallowing).  Wife stated prior admission  for pna.  SLP recommends continue current diet/liquids with MBS tomorrow to fully assess.     Aspiration Risk   (mild-moderate)    Diet Recommendation Regular;Thin liquid   Liquid Administration via: Cup;No straw Medication Administration: Whole meds with puree Supervision: Full supervision/cueing for compensatory strategies;Patient able to self feed Compensations: Slow rate;Small sips/bites;Check for pocketing Postural Changes and/or Swallow Maneuvers: Seated upright 90 degrees    Other  Recommendations Recommended Consults: MBS Oral Care Recommendations: Oral care BID   Follow Up Recommendations   (TBD)    Frequency and Duration        Pertinent Vitals/Pain No pain         Swallow Study          Oral/Motor/Sensory Function     Ice Chips Ice chips: Not tested   Thin Liquid Thin Liquid: Impaired Presentation: Cup Oral Phase Impairments: Reduced labial seal;Impaired anterior to posterior transit Oral Phase Functional Implications: Right anterior spillage;Left anterior spillage Pharyngeal  Phase Impairments: Suspected delayed Swallow;Cough - Delayed    Nectar Thick Nectar Thick Liquid: Not tested   Honey Thick Honey Thick Liquid: Not tested   Puree Puree: Within functional limits   Solid   GO    Solid: Within functional limits       Roque CashLitaker, Breck CoonsLisa Willis 03/27/2014,2:00 PM  Breck CoonsLisa Willis Crane CreekLitaker M.Ed [employer]-SLP Pager [phone]

## 2014-03-27 NOTE — Progress Notes (Signed)
Inpatient RN visit- [facility] RN Visit-Karen Merilynn FinlandRobertson RN  [facility] Un[facility] Di[facility] Failure. Pt has OOF DNR in place in his home. Noted to be FULL Code this hospitalization.   Patient seen at bedside, eyes closed, roused easily to voice, alert, oriented x4. He was able to recall events that led to his hospitalization,denied any pain or discomfort. He did report being hungry and wanting a bath. Writer followed up with staff RN Vonna KotykJay and Harlow MaresAide Lizbeth regarding both. Tray had been ordered, original orders were for NPO status, this has been changed to carb modified per chart review, bath to be given with in the next 15 minutes. Also per chart review pt has received both IV and po potassium for an admission KCL of 2.9. Results pending for labs at time of visit as pt had refused lab draw earlier this am. Lab tech present at end of RN visit, patient agreed to blood draw. No discharge plans at this time per Staff RN Vonna KotykJay. No family present at time of visit. [facility] to follow through discharge.  Patient's home medication list and transfer summary in place on shadow chart.  Please call [facility] @ [phone]-  with any hospice needs.   Thank you. Hansel StarlingKaren E. Robertson, RN  [facility]  Hospice Liaison  [phone])

## 2014-03-27 NOTE — Progress Notes (Signed)
Physical Therapy Treatment Patient Details Name: Justin HansenDonald Maturin Sr. MRN: [id number] DOB: 04/09/1946 Today's Date: 03/27/2014    History of Present Illness Pt is a 68 y/o male admitted after a spell of abnormal behavior. Pt was at MD office when he began sweating profusely with decreased responsiveness. Per chart review, pt lives at home with his wife and spends most of his time in the bed. Mostly dependent with ADL's. ~8 months ago pt had L BKA.     PT Comments    Pt admitted with the above. Pt currently with functional limitations due to the deficits listed below (see PT Problem List). At the time of PT eval pt was able to perform transfer to EOB with +2 assist. Pt unable to scoot, even with total assist. Overall rehab effort was poor, and pt states he is very fatigued and weak this morning - "I just want to eat something". Pt will benefit from skilled PT to increase their independence and safety with mobility to allow discharge to the venue listed below.    Follow Up Recommendations  SNF;Supervision/Assistance - 24 hour     Equipment Recommendations  None recommended by PT    Recommendations for Other Services       Precautions / Restrictions Precautions Precautions: Fall Restrictions Weight Bearing Restrictions: No    Mobility  Bed Mobility Overal bed mobility: Needs Assistance;+2 for physical assistance Bed Mobility: Supine to Sit;Sit to Supine;Rolling Rolling: Mod assist   Supine to sit: Mod assist;+2 for physical assistance Sit to supine: Max assist;+2 for physical assistance   General bed mobility comments: Pt requires assist to transition to/from EOB, and requires mod to max assist at times to maintain sitting balance at EOB.   Transfers Overall transfer level: Needs assistance   Transfers: Lateral/Scoot Transfers           General transfer comment: Attempted scooting at EOB prior to attempting to scoot to drop arm chair. Pt unable to effectively shift weight  or use UE's to scoot hips at all. Total assist for unsuccessful attempt.   Ambulation/Gait                 Stairs            Wheelchair Mobility    Modified Rankin (Stroke Patients Only)       Balance Overall balance assessment: Needs assistance Sitting-balance support: Feet supported;Bilateral upper extremity supported (RLE) Sitting balance-Leahy Scale: Zero Sitting balance - Comments: Max assist required at times to maintain sitting balance.  Postural control: Posterior lean                          Cognition Arousal/Alertness: Lethargic Behavior During Therapy: Flat affect Overall Cognitive Status: Difficult to assess                      Exercises      General Comments General comments (skin integrity, edema, etc.): Pt scratching his leg while sitting EOB, drawing blood. While rolling in bed to change soiled pad, pt reached back to bottom area and began scratching, not stopping when asked. Pt again drew blood, and RN was notified.       Pertinent Vitals/Pain Pain Assessment: No/denies pain    Home Living Family/patient expects to be discharged to:: Skilled nursing facility Living Arrangements: Spouse/significant other Available Help at Discharge: Personal care attendant;Available PRN/intermittently  Prior Function Level of Independence: Needs assistance  Gait / Transfers Assistance Needed: Pt reports he has not walked since returning home. States he needs assist to transfer bed to chair.  ADL's / Homemaking Assistance Needed: Pt reports that he has aides at home that assist with all ADL's. Pt states he does not assist at all.      PT Goals (current goals can now be found in the care plan section) Acute Rehab PT Goals Patient Stated Goal: Pt did not state goals during session.  PT Goal Formulation: With patient Time For Goal Achievement: 04/10/14 Potential to Achieve Goals: Good    Frequency  Min 2X/week     PT Plan      Co-evaluation             End of Session Equipment Utilized During Treatment: Gait belt Activity Tolerance: Patient limited by fatigue;Patient limited by lethargy Patient left: in bed;with call bell/phone within reach;with nursing/sitter in room     Time: 0926-0953 PT Time Calculation (min): 27 min  Charges:  $Therapeutic Activity: 23-37 mins                    G Codes:      Conni SlipperKirkman, Charmin Aguiniga 03/27/2014, 10:37 AM  Conni SlipperLaura Avyukt Cimo, PT, DPT Acute Rehabilitation Services Pager: [phone]

## 2014-03-28 ENCOUNTER — Inpatient Hospital Stay (HOSPITAL_COMMUNITY)

## 2014-03-28 DIAGNOSIS — N184 Chronic kidney disease, stage 4 (severe): Secondary | ICD-10-CM

## 2014-03-28 DIAGNOSIS — B958 Unspecified staphylococcus as the cause of diseases classified elsewhere: Secondary | ICD-10-CM

## 2014-03-28 DIAGNOSIS — D638 Anemia in other chronic diseases classified elsewhere: Secondary | ICD-10-CM

## 2014-03-28 DIAGNOSIS — N179 Acute kidney failure, unspecified: Secondary | ICD-10-CM

## 2014-03-28 DIAGNOSIS — N189 Chronic kidney disease, unspecified: Secondary | ICD-10-CM

## 2014-03-28 DIAGNOSIS — R7881 Bacteremia: Secondary | ICD-10-CM

## 2014-03-28 LAB — MAGNESIUM: Magnesium: 1.7 mg/dL (ref 1.5–2.5)

## 2014-03-28 LAB — BASIC METABOLIC PANEL
ANION GAP: 10 (ref 5–15)
BUN: 12 mg/dL (ref 6–23)
CHLORIDE: 110 meq/L (ref 96–112)
CO2: 28 meq/L (ref 19–32)
Calcium: 7.8 mg/dL — ABNORMAL LOW (ref 8.4–10.5)
Creatinine, Ser: 1.44 mg/dL — ABNORMAL HIGH (ref 0.50–1.35)
GFR calc Af Amer: 56 mL/min — ABNORMAL LOW (ref >90)
GFR calc non Af Amer: 48 mL/min — ABNORMAL LOW (ref >90)
Glucose, Bld: 151 mg/dL — ABNORMAL HIGH (ref 70–99)
Potassium: 4.1 mEq/L (ref 3.7–5.3)
Sodium: 148 mEq/L — ABNORMAL HIGH (ref 137–147)

## 2014-03-28 LAB — CBC
HCT: 27.1 % — ABNORMAL LOW (ref 39.0–52.0)
Hemoglobin: 9.2 g/dL — ABNORMAL LOW (ref 13.0–17.0)
MCH: 27.1 pg (ref 26.0–34.0)
MCHC: 33.9 g/dL (ref 30.0–36.0)
MCV: 79.9 fL (ref 78.0–100.0)
PLATELETS: 200 10*3/uL (ref 150–400)
RBC: 3.39 MIL/uL — AB (ref 4.22–5.81)
RDW: 14.8 % (ref 11.5–15.5)
WBC: 10 10*3/uL (ref 4.0–10.5)

## 2014-03-28 LAB — GLUCOSE, CAPILLARY
Glucose-Capillary: 146 mg/dL — ABNORMAL HIGH (ref 70–99)
Glucose-Capillary: 174 mg/dL — ABNORMAL HIGH (ref 70–99)
Glucose-Capillary: 189 mg/dL — ABNORMAL HIGH (ref 70–99)

## 2014-03-28 MED ORDER — FUROSEMIDE 20 MG PO TABS: 20.0000 mg | ORAL_TABLET | Freq: Every day | ORAL | Status: DC

## 2014-03-28 MED ORDER — ASPIRIN 81 MG PO CHEW: 81.0000 mg | CHEWABLE_TABLET | Freq: Every day | ORAL | Status: DC

## 2014-03-28 MED ORDER — MUPIROCIN 2 % EX OINT
1.0000 "application " | TOPICAL_OINTMENT | Freq: Two times a day (BID) | CUTANEOUS | Status: DC
  Administered 2014-03-28: 1 via NASAL
  Filled 2014-03-28: qty 22

## 2014-03-28 MED ORDER — CHLORHEXIDINE GLUCONATE CLOTH 2 % EX PADS
6.0000 | MEDICATED_PAD | Freq: Every day | CUTANEOUS | Status: DC
  Administered 2014-03-28: 6 via TOPICAL

## 2014-03-28 NOTE — Evaluation (Signed)
Occupational Therapy Evaluation Patient Details Name: Justin HansenDonald Reep Sr. MRN: [id number] DOB: 12/03/1945 Today's Date: 03/28/2014    History of Present Illness Pt is a 68 y/o male admitted after a spell of abnormal behavior. Pt was at MD office when he began sweating profusely with decreased responsiveness. Per chart review, pt lives at home with his wife and spends most of his time in the bed. Mostly dependent with ADL's. ~8 months ago pt had L BKA.    Clinical Impression   Pt is dependent in all ADL except feeding at baseline.  He is bedpan dependent, but assists with rolling.  Pt wants to return home with assist of his wife and an aide that helps 5 days a week.  No interest in lift equipment, per pt wife does not want to store it.    Follow Up Recommendations  Supervision/Assistance - 24 hour;SNF (SNF if family is unable to manage at home.)    Equipment Recommendations  None recommended by OT    Recommendations for Other Services       Precautions / Restrictions Precautions Precautions: Fall Restrictions Weight Bearing Restrictions: No      Mobility Bed Mobility     Rolling: Mod assist (with rail for positioning)         General bed mobility comments: +2 dependence to get to EOB per PT note  Transfers                 General transfer comment: Did not transfer pt, recommend lift.    Balance                                            ADL Overall ADL's : At baseline (Pt is dependent in bathing, dressing, toileting. Can self fe)                                       General ADL Comments: Pt continues to be able to self feed.     Vision                     Perception     Praxis      Pertinent Vitals/Pain Pain Assessment: No/denies pain     Hand Dominance Right   Extremity/Trunk Assessment Upper Extremity Assessment Upper Extremity Assessment: Generalized weakness   Lower Extremity Assessment Lower  Extremity Assessment: Defer to PT evaluation       Communication Communication Communication: No difficulties   Cognition Arousal/Alertness: Awake/alert Behavior During Therapy: Flat affect Overall Cognitive Status: Within Functional Limits for tasks assessed                     General Comments       Exercises       Shoulder Instructions      Home Living Family/patient expects to be discharged to:: Private residence Living Arrangements: Spouse/significant other Available Help at Discharge: Personal care attendant;Available 24 hours/day;Family (aide comes 5 days a week) Type of Home: House Home Access: Ramped entrance     Home Layout: One level               Home Equipment: Walker - 2 wheels;Wheelchair - [employer]manual;Bedside commode;Hospital bed (mattress overlay)   Additional Comments: Pt wants to go home with  assist of wife and aide.      Prior Functioning/Environment Level of Independence: Needs assistance  Gait / Transfers Assistance Needed: Pt reports he has not walked since returning home. States he needs assist to transfer bed to chair.  ADL's / Homemaking Assistance Needed: Pt reports that he has aides at home that assist with all ADL's. Pt states he does not assist at all.    Comments: pt using bed pan for all voids    OT Diagnosis:     OT Problem List:     OT Treatment/Interventions:      OT Goals(Current goals can be found in the care plan section) Acute Rehab OT Goals Patient Stated Goal: Go home.  OT Frequency:     Barriers to D/C:            Co-evaluation              End of Session    Activity Tolerance: Patient tolerated treatment well Patient left: in bed;with call bell/phone within reach;with bed alarm set;with nursing/sitter in room   Time: [id number] OT Time Calculation (min): 19 min Charges:  OT General Charges $OT Visit: 1 Procedure OT Evaluation $Initial OT Evaluation Tier I: 1 Procedure G-Codes:    [name],  [name] 03/28/2014, 10:16 AM [phone]

## 2014-03-28 NOTE — Progress Notes (Signed)
Removed and returned telemetry box 11 per order.  CCMD notified.

## 2014-03-28 NOTE — Discharge Summary (Signed)
Physician Discharge Summary  Justin Luhn Sr. NWG:[id number] DOB: [birth year] DOA: 03/26/2014  PCP: [name], MD  Admit date: 03/26/2014 Discharge date: 03/28/2014  Time spent: 45 minutes  Recommendations for Outpatient Follow-up:  1. PCP in 1 week 2. Dr.Harwani in 2 weeks 3. Resume [facility]  Discharge Diagnoses:  Principal Problem:   Presyncope   PVD/BKA   Bed Bound status   Chronic diastolic CHF (congestive heart failure)   Hypertension   DM (diabetes mellitus), type 2, uncontrolled, periph vascular complic   CKD (chronic kidney disease), stage IV   HLD (hyperlipidemia)   History of pulmonary embolism   Discharge Condition: guarded  Diet recommendation: low sodium, diabetic  Filed Weights   03/26/14 1415 03/26/14 2048 03/27/14 2106  Weight: 95.255 kg (210 lb) 97.1 kg (214 lb 1.1 oz) 98.2 kg (216 lb 7.9 oz)    History of present illness:  Justin Boateng Sr. is a 68 y.o. male with past medical history of hypertension, diabetes-type II, CKD-IV, congestive heart failure, hyperlipidemia, who presented after a pre- syncopal episode.  At the baseline, patient lives with his wife at home; most of time, he is bed bound at baseline, he went to his cardiologist office for routine checkup at about 2:00 PM. While he was and waiting and sitting in the chair, he suddenly started sweating diffusely and became less responsive. He did not seem to have passed out per his wife.    Hospital Course:  Presyncope -likely vaso-vagal vs arrhythmia due to hypokalemia -workup unremarkable, Tele and Orthostatic negative -Lasix resumed at lower dose -IF he has anymore episodes of this would consider stopping FLomax -I saw him for the first time on the day of discharge and he is adamant to go home, declines SNF, an ECHO has not been done yet and would defer this to his outpatient Cardiologist or PCP at this time  Hypokalemia -corrected  Chronic diastolic  CHF -compensated, lasix resumed at lower dose   Diabetes mellitus  - A1c was 8.0 on 07/16/13.  -Patient is taking NovoLog only at home without basal coverage -I did not change this  CKD-IV:  - At baseline   hx of PE:  -diagnosed in March 2015. On Eliquis at home.  -will continue Eliquis   PVD /sp BKA  Chronically ill and debilitated at baseline, mostly bedbound, does not walk at all for over 2years per pt report Followed by Hospice for all this issues   Discharge Exam: Filed Vitals:   03/28/14 0920  BP: 152/72  Pulse: 75  Temp: 98.7 F (37.1 C)  Resp: 17    General: AAOx2, chronically ill make Cardiovascular: S1S2/RRR Respiratory: CTAB BKA  Discharge Instructions You were cared for by a hospitalist during your hospital stay. If you have any questions about your discharge medications or the care you received while you were in the hospital after you are discharged, you can call the unit and asked to speak with the hospitalist on call if the hospitalist that took care of you is not available. Once you are discharged, your primary care physician will handle any further medical issues. Please note that NO REFILLS for any discharge medications will be authorized once you are discharged, as it is imperative that you return to your primary care physician (or establish a relationship with a primary care physician if you do not have one) for your aftercare needs so that they can reassess your need for medications and monitor your lab values.  Discharge Instructions  Diet - low sodium heart healthy    Complete by:  As directed      Diet - low sodium heart healthy    Complete by:  As directed      Increase activity slowly    Complete by:  As directed      Increase activity slowly    Complete by:  As directed           Current Discharge Medication List    CONTINUE these medications which have CHANGED   Details  furosemide (LASIX) 20 MG tablet Take 1 tablet (20 mg total) by  mouth daily. Qty: 30 tablet, Refills: 0      CONTINUE these medications which have NOT CHANGED   Details  amLODipine (NORVASC) 5 MG tablet Take 5 mg by mouth daily.    apixaban (ELIQUIS) 2.5 MG TABS tablet Take 2.5 mg by mouth 2 (two) times daily.    carvedilol (COREG) 12.5 MG tablet Take 1 tablet (12.5 mg total) by mouth 2 (two) times daily with a meal. Qty: 60 tablet, Refills: 3    cloNIDine (CATAPRES) 0.1 MG tablet Take 0.1 mg by mouth 3 (three) times daily.     hydrOXYzine (ATARAX/VISTARIL) 10 MG tablet Take 10 mg by mouth 3 (three) times daily as needed for itching.    insulin aspart (NOVOLOG) 100 UNIT/ML injection Inject into the skin 3 (three) times daily with meals. SSI 200-300   7 UNITS; 301-400  9 UNITS; 401-500   12  UNITS; > 500 14  UNITS AND CALL MD   CBG AC AND QHS    Multiple Vitamins-Minerals (CENTRUM SILVER ADULT 50+) TABS Take 0.5 tablets by mouth daily.    ondansetron (ZOFRAN) 4 MG tablet Take 1 tablet (4 mg total) by mouth every 6 (six) hours as needed for nausea. Qty: 20 tablet, Refills: 0    Tamsulosin HCl (FLOMAX) 0.4 MG CAPS Take 1 capsule (0.4 mg total) by mouth daily. Qty: 30 capsule, Refills: 3       Allergies  Allergen Reactions  . Ancef [Cefazolin] Itching and Rash  . Sulfa Antibiotics Itching and Rash   Follow-up Information   Follow up with [name],[name], MD. Schedule an appointment as soon as possible for a visit in 1 week.   Specialty:  Family Medicine   Contact information:   [address] [place] [address] [phone]        The results of significant diagnostics from this hospitalization (including imaging, microbiology, ancillary and laboratory) are listed below for reference.    Significant Diagnostic Studies: Ct Head Wo Contrast  03/26/2014   CLINICAL DATA:  Near syncopal episode. ; history of CHF diabetes and chronic renal insufficiency  EXAM: CT HEAD WITHOUT CONTRAST  TECHNIQUE: Contiguous axial images were obtained  from the base of the skull through the vertex without intravenous contrast.  COMPARISON:  Noncontrast CT scan of the brain of July 15, 2013  FINDINGS: There is mild diffuse cerebral atrophy with compensatory ventriculomegaly. There are stable basal ganglia calcifications bilaterally. There is no acute intracranial hemorrhage nor evidence of acute ischemic change. The cerebellum and brainstem exhibit no acute abnormalities. There are stable faint calcifications within both cerebral hemispheres.  The observed paranasal sinuses and mastoid air cells are clear. There is no acute skull fracture.  IMPRESSION: There is no acute intracranial abnormality. There are mild stable atrophic changes and intracranial calcifications as described. There is no acute skull fracture.   Electronically Signed  By: David  [place]   On: 03/26/2014 16:45   Dg Chest Port 1 View  03/26/2014   CLINICAL DATA:  Syncopal episode and shortness of breath ; history of CHF, diabetes, and chronic renal failure.  EXAM: PORTABLE CHEST - 1 VIEW  COMPARISON:  PA and lateral chest x-ray of September 06, 2013.  FINDINGS: The lungs are adequately inflated. There is a small right-sided pleural effusion. The cardiac silhouette is top-normal in size. The pulmonary vascularity is not engorged. There is stable deviation of the trachea towards the right. The observed portions of the bony thorax are unremarkable.  IMPRESSION: There is a small right pleural effusion not greatly changed from the previous study. There is stable deviation of the trachea toward the right. There is no acute cardiopulmonary abnormality.   Electronically Signed   By: David  [place]   On: 03/26/2014 15:50   Dg Swallowing Func-speech Pathology  03/28/2014   Rod Mae, CCC-SLP     03/28/2014  2:15 PM Objective Swallowing Evaluation: Modified Barium Swallowing Study   Patient Details  Name: Justin Moffatt Sr. MRN: [id number] Date of Birth: [birth year]  Today's Date: 03/28/2014 Time:  0130-0145 SLP Time Calculation (min): 15 min  Past Medical History:  Past Medical History  Diagnosis Date  . Diabetes mellitus   . Hypertension   . Hyperlipidemia   . CHF (congestive heart failure)   . Enlarged heart   . Pneumonia     hx of   . Osteomyelitis of foot     LT  . CKD (chronic kidney disease), stage IV   . Shortness of breath   . GERD (gastroesophageal reflux disease)    Past Surgical History:  Past Surgical History  Procedure Laterality Date  . Right leg fracture    . Skin graft    . Knee surgery    . Tee without cardioversion  03/17/2012    Procedure: TRANSESOPHAGEAL ECHOCARDIOGRAM (TEE);  Surgeon: Ricki Rodriguez, MD;  [facility];  Service: Cardiovascular;   Laterality: N/A;  . Incision and drainage Left 07/02/2013    Procedure: INCISION AND DRAINAGE LEFT FOOT;  Surgeon:  Kathryne Hitch, MD;  Location: [facility] ORS;  Service:  Orthopedics;  Laterality: Left;  . Amputation Left 07/18/2013    Procedure: AMPUTATION BELOW KNEE;  Surgeon: Kathryne Hitch, MD;  Location: [facility] OR;  Service: Orthopedics;   Laterality: Left;   HPI:  68 y.o. male with past medical history of hypertension, AKA,  diabetes-type II, CKD-IV, congestive heart failure,  hyperlipidemia admitted with a syncope episode.  Patient was  found to have potassium 2.9, negative CT-head for acute  abnormality. CXR revealedhere small right pleural effusion and  stable deviation of the trachea toward the right. There is no  acute cardiopulmonary abnormality.      Assessment / Plan / Recommendation Clinical Impression  Dysphagia Diagnosis: Mild oral phase dysphagia;Mild pharyngeal  phase dysphagia; pt with no penetration or aspiration with any  consistency; during oral phase of the swallow, oral holding and  tongue pumping noted intermittently, but with a functional  swallow observed once bolus was posteriorly propelled in oral  cavity; pharyngeally, mild vallecular residue was noted with only  thin consistency, which cleared with  subsequent swallow; small  amounts of liquids/puree/solids assessed, with no straw  assessment with liquids d/t pt refusal; barium tablet unable to  be assessed d/t pt propelling tablet from oral cavity; decreased  cognitive status places pt at a mild risk for  aspiration d/t oral  holding/cues required to swallow bolus, but with verbal cueing,  pt was able to safely swallow all consistencies    Treatment Recommendation  Therapy as outlined in treatment plan below    Diet Recommendation Regular;Thin liquid   Liquid Administration via: Cup;No straw Medication Administration: Whole meds with puree Supervision: Full supervision/cueing for compensatory  strategies;Patient able to self feed Compensations: Slow rate;Small sips/bites;Check for pocketing Postural Changes and/or Swallow Maneuvers: Seated upright 90  degrees    Other  Recommendations Oral Care Recommendations: Oral care BID   Follow Up Recommendations  24 hour supervision/assistance    Frequency and Duration min 1 x/week  1 week   Pertinent Vitals/Pain WDL    SLP Swallow Goals  See POC   General Date of Onset: 03/26/14 HPI: 68 y.o. male with past medical history of hypertension, AKA,  diabetes-type II, CKD-IV, congestive heart failure,  hyperlipidemia admitted with a syncope episode.  Patient was  found to have potassium 2.9, negative CT-head for acute  abnormality. CXR revealedhere small right pleural effusion and  stable deviation of the trachea toward the right. There is no  acute cardiopulmonary abnormality.  Type of Study: Modified Barium Swallowing Study Reason for Referral: Objectively evaluate swallowing function Previous Swallow Assessment: BSE 03/27/14 with s/s of aspiration  with thin Diet Prior to this Study: Regular;Thin liquids Temperature Spikes Noted: No Respiratory Status: Room air History of Recent Intubation: No Behavior/Cognition: Alert;Cooperative;Requires cueing Oral Cavity - Dentition: Poor condition;Missing dentition Oral Motor /  Sensory Function: Within functional limits Self-Feeding Abilities: Able to feed self;Needs assist Patient Positioning: Upright in chair Baseline Vocal Quality: Clear Volitional Cough: Weak Volitional Swallow: Able to elicit Anatomy: Within functional limits Pharyngeal Secretions: Not observed secondary MBS    Reason for Referral Objectively evaluate swallowing function   Oral Phase Oral Preparation/Oral Phase Oral Phase: Impaired Oral - Thin Oral - Thin Cup: Lingual pumping;Holding of bolus;Delayed oral  transit Oral - Solids Oral - Puree: Lingual pumping;Holding of bolus;Delayed oral  transit Oral - Regular: Impaired mastication;Holding of bolus;Delayed  oral transit   Pharyngeal Phase Pharyngeal Phase Pharyngeal Phase: Impaired Pharyngeal - Thin Pharyngeal - Thin Cup: Pharyngeal residue - valleculae  Cervical Esophageal Phase        Cervical Esophageal Phase Cervical Esophageal Phase: WFL         ADAMS,PAT, M.S., CCC-SLP 03/28/2014, 2:07 PM     Microbiology: Recent Results (from the past 240 hour(s))  MRSA PCR SCREENING     Status: Abnormal   Collection Time    03/27/14  9:04 AM      Result Value Ref Range Status   MRSA by PCR POSITIVE (*) NEGATIVE Final   Comment:            The GeneXpert MRSA Assay (FDA     approved for NASAL specimens     only), is one component of a     comprehensive MRSA colonization     surveillance program. It is not     intended to diagnose MRSA     infection nor to guide or     monitor treatment for     MRSA infections.     RESULT CALLED TO, READ BACK BY AND VERIFIED WITH:     J. NARAMBAS 10:20 03/27/14 ([email or web address])     Labs: Basic Metabolic Panel:  Recent Labs Lab 03/26/14 1516 03/27/14 1055 03/28/14 0528  NA 147 148* 148*  K 2.9* 3.1* 4.1  CL 108 110 110  CO2  28 28 28   GLUCOSE 121* 124* 151*  BUN 10 10 12   CREATININE 1.35 1.40* 1.44*  CALCIUM 7.9* 7.8* 7.8*  MG  --   --  1.7   Liver Function Tests: No results found for this basename: AST, ALT,  ALKPHOS, BILITOT, PROT, ALBUMIN,  in the last 168 hours No results found for this basename: LIPASE, AMYLASE,  in the last 168 hours No results found for this basename: AMMONIA,  in the last 168 hours CBC:  Recent Labs Lab 03/26/14 1516 03/28/14 0528  WBC 8.0 10.0  HGB 10.9* 9.2*  HCT 31.8* 27.1*  MCV 76.3* 79.9  PLT 251 200   Cardiac Enzymes:  Recent Labs Lab 03/26/14 2232 03/27/14 0311  TROPONINI <0.30 <0.30   BNP: BNP (last 3 results)  Recent Labs  07/15/13 1659 09/05/13 1510  PROBNP 3108.0* 10229.0*   CBG:  Recent Labs Lab 03/27/14 1131 03/27/14 1637 03/27/14 2105 03/28/14 0727 03/28/14 1202  GLUCAP 123* 167* 160* 146* 174*       Signed:  Corisa Montini  [facility] 03/28/2014, 3:59 PM

## 2014-03-28 NOTE — Progress Notes (Addendum)
In[facility] RN [facility]- [facility]  [facility] Visit-Karen Merilynn Finlandobertson RN  [facility] Unspecified Di[facility] Heart Failure  Pt is Full code this hospitalization.  Pt has an OOF DNR in place in his home. Pt seen at bedside, lying in bed with eyes closed, roused easily to voice.  Pt denied pain or discomfort, no shortness of breath or dizziness. He acknowledged that he felt the plan was for him to rerun home today. Attending Dr. Jomarie LongsJoseph in during visit and also discussed. PT and Ot recommend SNf , but patient confirmed his wishes to return home. Writer spoke with [facility] Drinda Buttsnnette to notify that patient will need transport home [facility] team made aware of pending discharge.  Patient's home medication list and transfer summary is on shadow chart.  Please call [facility] @ [phone]  with any hospice needs.   Thank you. Hansel StarlingKaren E. Robertson, RN, BSN, [facility]  Hospice Liaison  [phone])

## 2014-03-28 NOTE — Clinical Social Work Note (Signed)
Patient discharging home today and ambulance transport requested. CSW facilitated transport home via [facility].  Genelle BalVanessa Hashim Eichhorst, MSW, LCSW [phone]

## 2014-03-28 NOTE — Progress Notes (Signed)
Patient Discharge:  Disposition: home with home hospice  Education: Educated patient on syncope.  Notified patient of discharge.  Educated patient on discharge medications and prescriptions.  Called wife and notified wife of discharge and medications as well.  AVS signed.    IV: removed. Clean/dry/intact  Telemetry: removed and returned  Prescriptions:  Prescription for Lasix given to patient with belongings.  Wife notified that prescription was in belonging bag with AVS.    Transportation:  EMS ride to house  Belongings: Gathered by Charity fundraiserN.  Wife notified RN to pack shirts from closet.  Placed in belonging bag and sent with patient.

## 2014-03-28 NOTE — Procedures (Signed)
Objective Swallowing Evaluation: Modified Barium Swallowing Study  Patient Details  Name: Justin HansenDonald Hollenberg Sr. MRN: [id number] Date of Birth: 03/13/1946  Today's Date: 03/28/2014 Time: 0130-0145 SLP Time Calculation (min): 15 min  Past Medical History:  Past Medical History  Diagnosis Date  . Diabetes mellitus   . Hypertension   . Hyperlipidemia   . CHF (congestive heart failure)   . Enlarged heart   . Pneumonia     hx of   . Osteomyelitis of foot     LT  . CKD (chronic kidney disease), stage IV   . Shortness of breath   . GERD (gastroesophageal reflux disease)    Past Surgical History:  Past Surgical History  Procedure Laterality Date  . Right leg fracture    . Skin graft    . Knee surgery    . Tee without cardioversion  03/17/2012    Procedure: TRANSESOPHAGEAL ECHOCARDIOGRAM (TEE);  Surgeon: Ricki RodriguezAjay S Kadakia, MD;  [facility];  Service: Cardiovascular;  Laterality: N/A;  . Incision and drainage Left 07/02/2013    Procedure: INCISION AND DRAINAGE LEFT FOOT;  Surgeon: Kathryne Hitchhristopher Y Blackman, MD;  Location: [facility] ORS;  Service: Orthopedics;  Laterality: Left;  . Amputation Left 07/18/2013    Procedure: AMPUTATION BELOW KNEE;  Surgeon: Kathryne Hitchhristopher Y Blackman, MD;  [facility];  Service: Orthopedics;  Laterality: Left;   HPI:  68 y.o. male with past medical history of hypertension, AKA, diabetes-type II, CKD-IV, congestive heart failure, hyperlipidemia admitted with a syncope episode.  Patient was found to have potassium 2.9, negative CT-head for acute abnormality. CXR revealedhere small right pleural effusion and stable deviation of the trachea toward the right. There is no acute cardiopulmonary abnormality.      Assessment / Plan / Recommendation Clinical Impression  Dysphagia Diagnosis: Mild oral phase dysphagia;Mild pharyngeal phase dysphagia; pt with no penetration or aspiration with any consistency; during oral phase of the swallow, oral holding and tongue pumping  noted intermittently, but with a functional swallow observed once bolus was posteriorly propelled in oral cavity; pharyngeally, mild vallecular residue was noted with only thin consistency, which cleared with subsequent swallow; small amounts of liquids/puree/solids assessed, with no straw assessment with liquids d/t pt refusal; barium tablet unable to be assessed d/t pt propelling tablet from oral cavity; decreased cognitive status places pt at a mild risk for aspiration d/t oral holding/cues required to swallow bolus, but with verbal cueing, pt was able to safely swallow all consistencies    Treatment Recommendation  Therapy as outlined in treatment plan below    Diet Recommendation Regular;Thin liquid   Liquid Administration via: Cup;No straw Medication Administration: Whole meds with puree Supervision: Full supervision/cueing for compensatory strategies;Patient able to self feed Compensations: Slow rate;Small sips/bites;Check for pocketing Postural Changes and/or Swallow Maneuvers: Seated upright 90 degrees    Other  Recommendations Oral Care Recommendations: Oral care BID   Follow Up Recommendations  24 hour supervision/assistance    Frequency and Duration min 1 x/week  1 week   Pertinent Vitals/Pain WDL    SLP Swallow Goals  See POC   General Date of Onset: 03/26/14 HPI: [age] y.o. male with past medical history of hypertension, AKA, diabetes-type II, CKD-IV, congestive heart failure, hyperlipidemia admitted with a syncope episode.  Patient was found to have potassium 2.9, negative CT-head for acute abnormality. CXR revealedhere small right pleural effusion and stable deviation of the trachea toward the right. There is no acute cardiopulmonary abnormality.  Type of Study: Modified Barium Swallowing  Study Reason for Referral: Objectively evaluate swallowing function Previous Swallow Assessment: BSE 03/27/14 with s/s of aspiration with thin Diet Prior to this Study: Regular;Thin  liquids Temperature Spikes Noted: No Respiratory Status: Room air History of Recent Intubation: No Behavior/Cognition: Alert;Cooperative;Requires cueing Oral Cavity - Dentition: Poor condition;Missing dentition Oral Motor / Sensory Function: Within functional limits Self-Feeding Abilities: Able to feed self;Needs assist Patient Positioning: Upright in chair Baseline Vocal Quality: Clear Volitional Cough: Weak Volitional Swallow: Able to elicit Anatomy: Within functional limits Pharyngeal Secretions: Not observed secondary MBS    Reason for Referral Objectively evaluate swallowing function   Oral Phase Oral Preparation/Oral Phase Oral Phase: Impaired Oral - Thin Oral - Thin Cup: Lingual pumping;Holding of bolus;Delayed oral transit Oral - Solids Oral - Puree: Lingual pumping;Holding of bolus;Delayed oral transit Oral - Regular: Impaired mastication;Holding of bolus;Delayed oral transit   Pharyngeal Phase Pharyngeal Phase Pharyngeal Phase: Impaired Pharyngeal - Thin Pharyngeal - Thin Cup: Pharyngeal residue - valleculae  Cervical Esophageal Phase        Cervical Esophageal Phase Cervical Esophageal Phase: WFL         Askari Kinley,PAT, M.S., CCC-SLP 03/28/2014, 2:07 PM

## 2014-08-21 IMAGING — CR DG CHEST 1V PORT
1 series · 1 of 1 positions shown · non-contrast
Comparison: Chest radiograph performed 03/08/2013

CLINICAL DATA: Fever and shortness of breath; hypotension.  Altered
mental status.

PORTABLE CHEST - 1 VIEW

[AP]
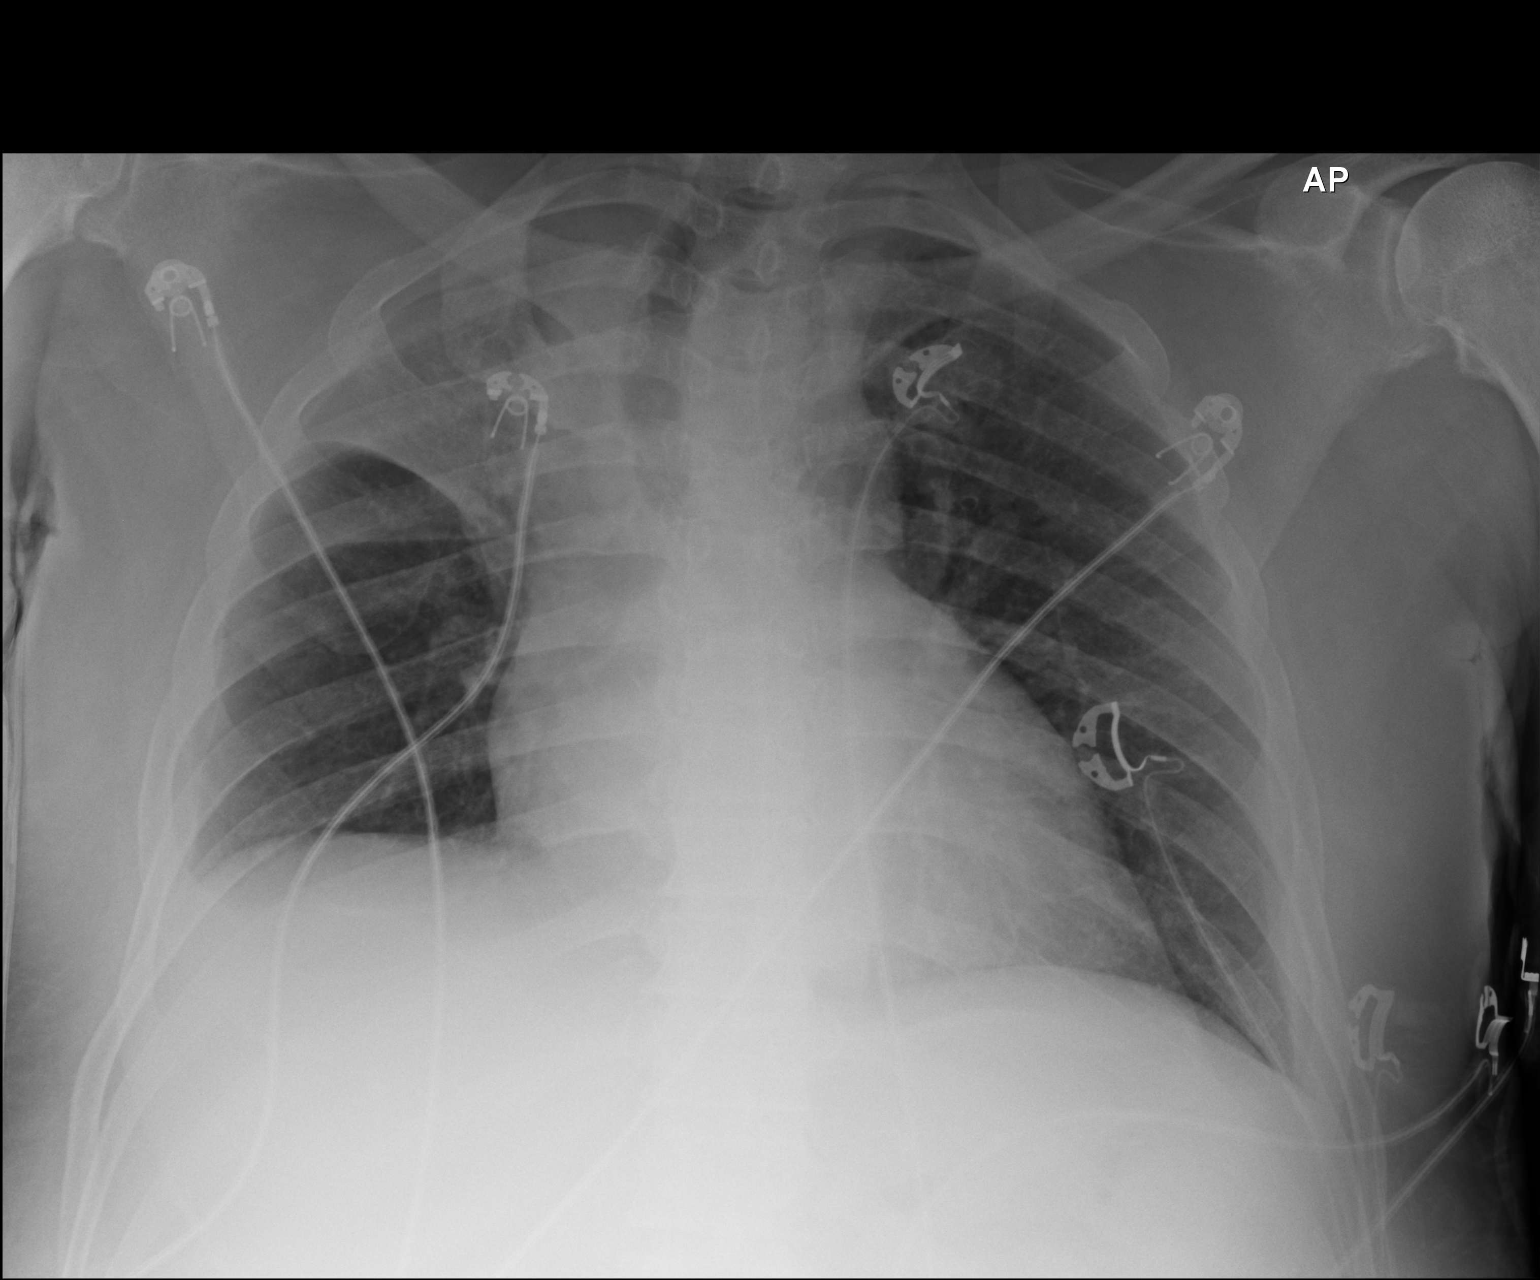

[1 of 1 positions shown; findings below may reference images not displayed]

FINDINGS: There is new consolidation of the right upper lobe. This
is most compatible with pneumonia.  An underlying obstructive
lesion cannot be entirely excluded, though the recent prior study
demonstrate no evidence of hilar prominence.

A small right pleural effusion is again noted; there is no evidence
of pneumothorax.

The cardiomediastinal silhouette is borderline normal in size.  No
acute osseous abnormalities are identified.
IMPRESSION: New consolidation of the right upper lung lobe, most compatible
with pneumonia.  Small right pleural effusion again noted.  An
underlying obstructing lesion cannot be entirely excluded; would
recommend follow-up CT of the chest after completion of treatment
for pneumonia.

## 2014-08-28 DIAGNOSIS — I509 Heart failure, unspecified: Secondary | ICD-10-CM | POA: Diagnosis not present

## 2014-10-07 DIAGNOSIS — I5020 Unspecified systolic (congestive) heart failure: Secondary | ICD-10-CM | POA: Diagnosis not present

## 2014-10-12 DIAGNOSIS — R531 Weakness: Secondary | ICD-10-CM | POA: Diagnosis not present

## 2014-11-08 DIAGNOSIS — R531 Weakness: Secondary | ICD-10-CM | POA: Diagnosis not present

## 2014-11-13 DIAGNOSIS — I5020 Unspecified systolic (congestive) heart failure: Secondary | ICD-10-CM | POA: Diagnosis not present

## 2014-12-30 DIAGNOSIS — I5020 Unspecified systolic (congestive) heart failure: Secondary | ICD-10-CM | POA: Diagnosis not present

## 2015-01-04 DIAGNOSIS — Z755 Holiday relief care: Secondary | ICD-10-CM | POA: Diagnosis not present

## 2015-01-28 ENCOUNTER — Encounter (HOSPITAL_COMMUNITY)

## 2015-02-10 IMAGING — CR DG CHEST 1V PORT
1 series · 1 of 1 positions shown · non-contrast
Comparison: DG CHEST 1V PORT dated 07/15/2013

CLINICAL DATA: Cough, weakness, confusion

EXAM:
PORTABLE CHEST - 1 VIEW

[AP]
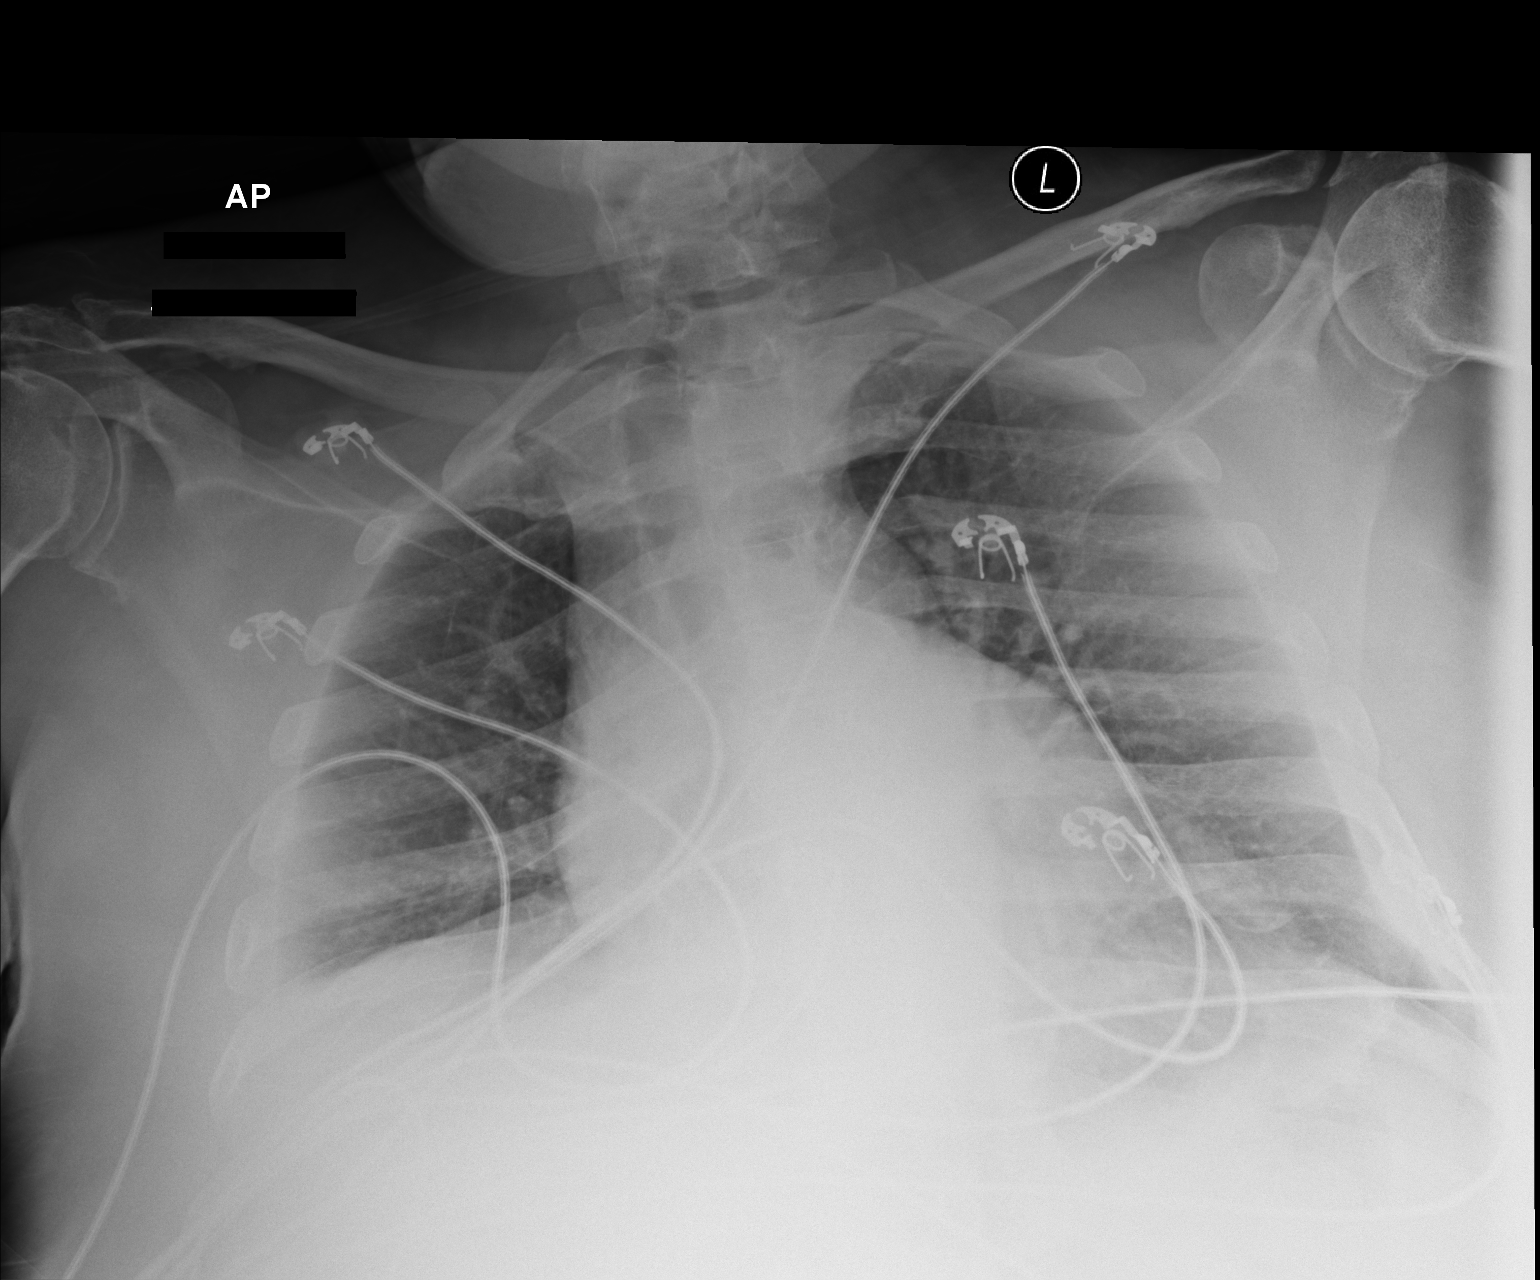

[1 of 1 positions shown; findings below may reference images not displayed]

FINDINGS: Low lung volumes. Cardiac silhouette is enlarged. . Both lungs are
clear. The visualized skeletal structures are unremarkable.
IMPRESSION: No active disease.

## 2015-02-11 IMAGING — CT CT ANGIO CHEST
2 of 9 series · 16 of 46 positions shown · IV contrast (Omni 300)
Comparison: Chest x-ray obtained earlier today

CLINICAL DATA: Fever and chills

EXAM:
CT ANGIOGRAPHY CHEST WITH CONTRAST
TECHNIQUE: Multidetector CT imaging of the chest was performed using the
standard protocol during bolus administration of intravenous
contrast. Multiplanar CT image reconstructions and MIPs were
obtained to evaluate the vascular anatomy.
CONTRAST:  75mL OMNIPAQUE IOHEXOL 350 MG/ML SOLN

[Series 5: thins · axial · 0.69mm/px · z∈[-227,-24]mm · 13 of 229 slices shown]
[im 13/229  lung]
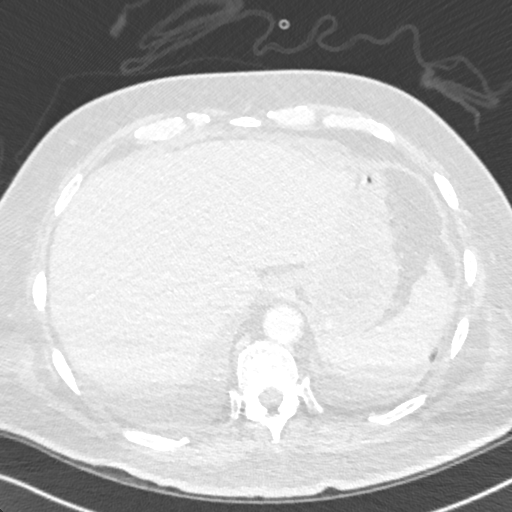
[im 26/229  soft-tissue]
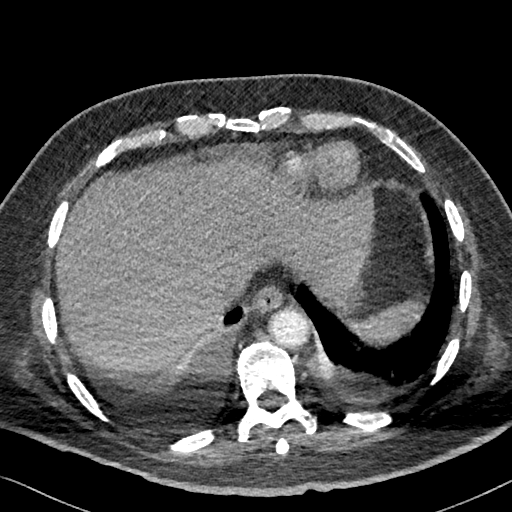
[im 51/229  lung]
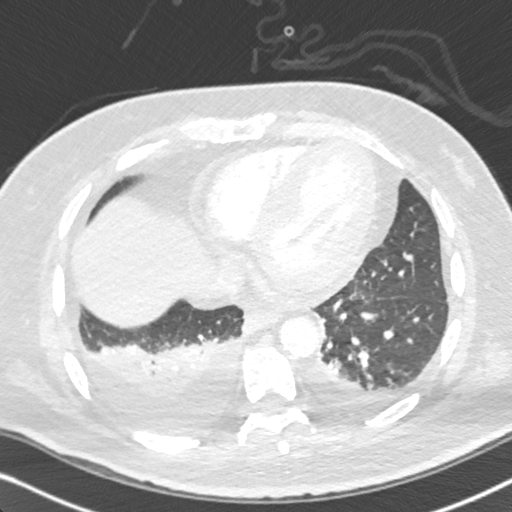
[im 64/229  soft-tissue]
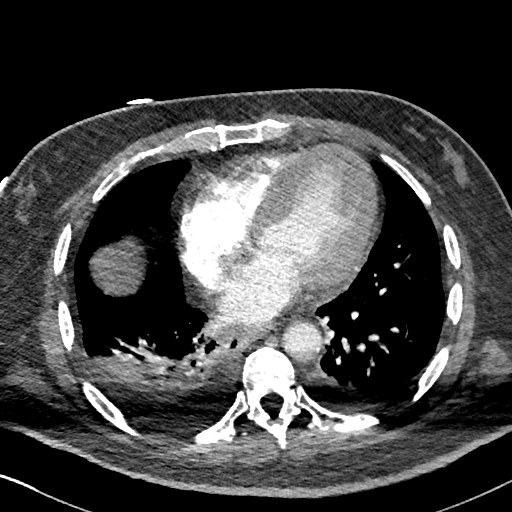
[im 77/229  lung]
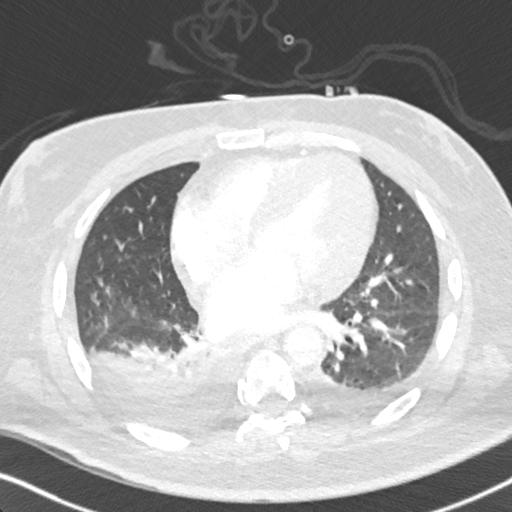
[im 102/229  soft-tissue]
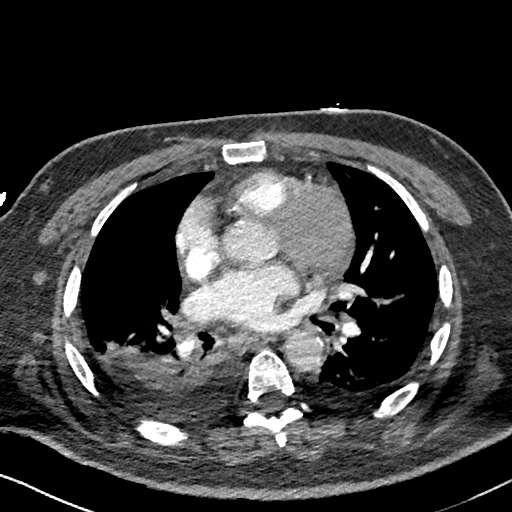
[im 115/229  lung]
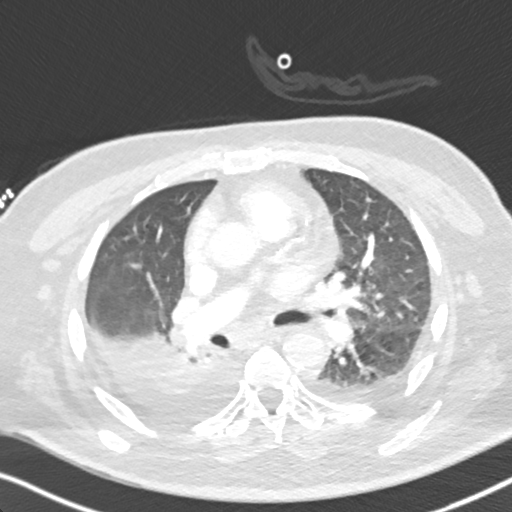
[im 127/229  soft-tissue]
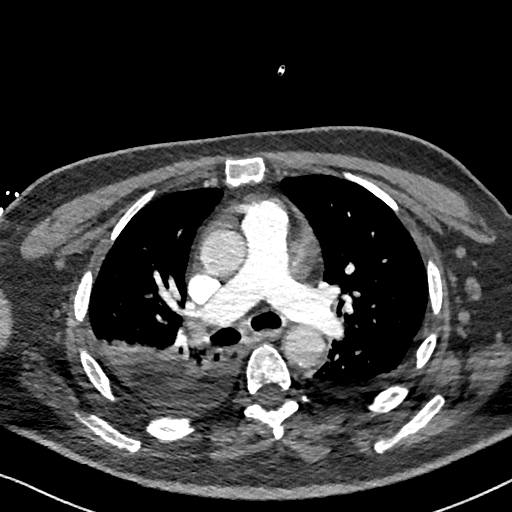
[im 153/229  lung]
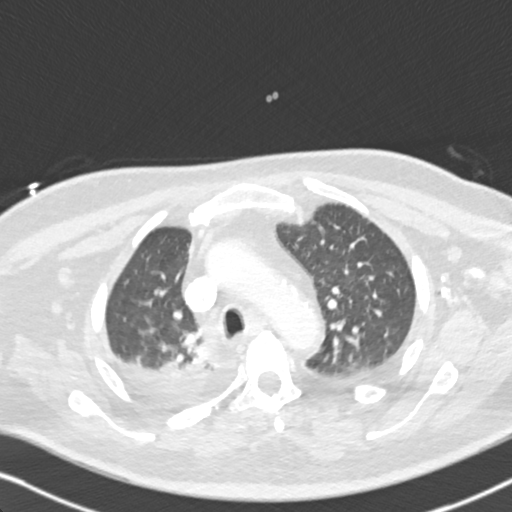
[im 165/229  soft-tissue]
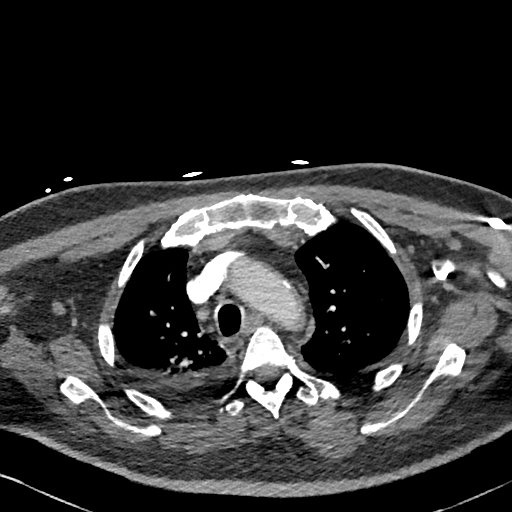
[im 178/229  lung]
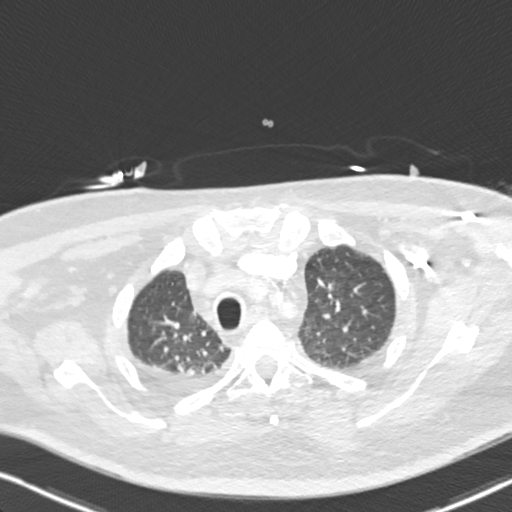
[im 203/229  soft-tissue]
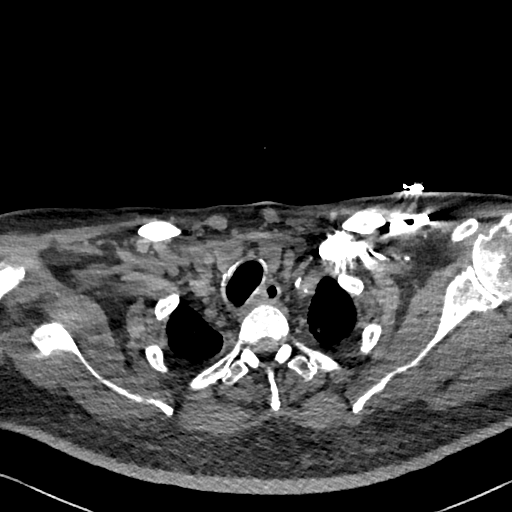
[im 216/229  lung]
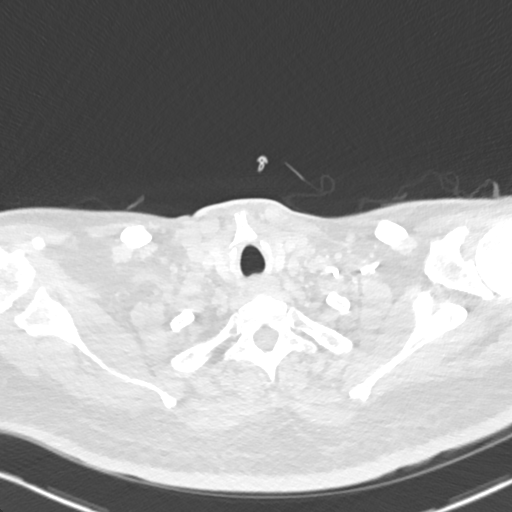

[Series 7: coronal mpr · coronal · 0.47mm/px · 3 of 127 slices shown]
[im 32/127  soft-tissue]
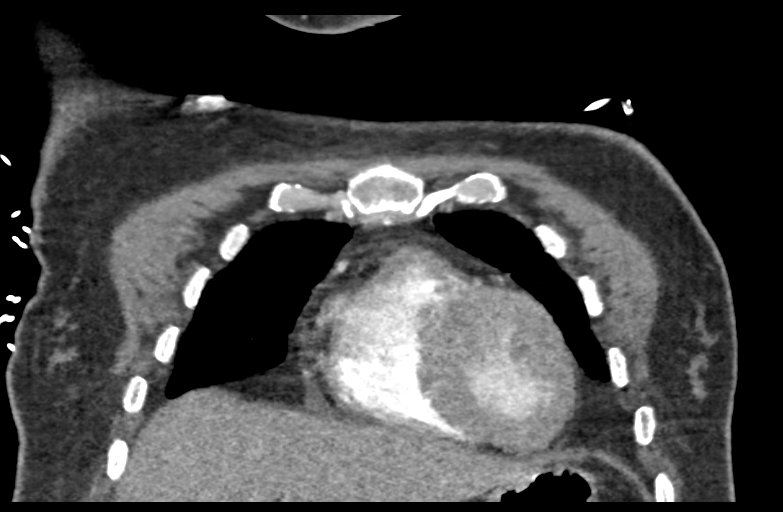
[im 64/127  soft-tissue]
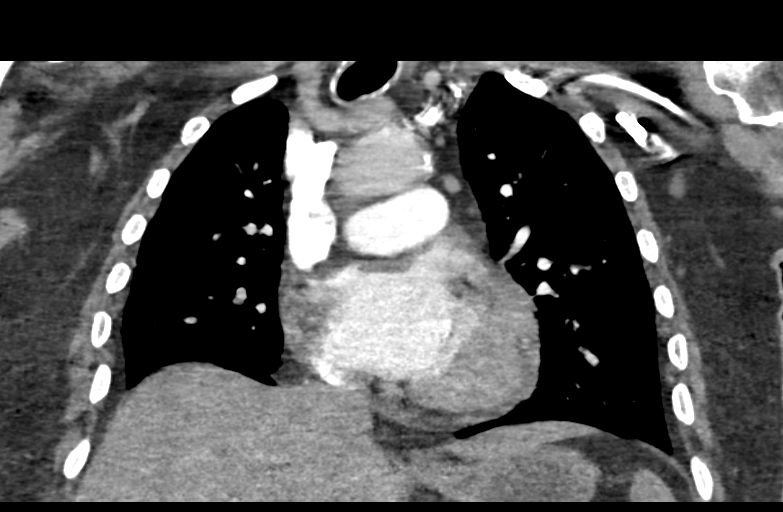
[im 95/127  soft-tissue]
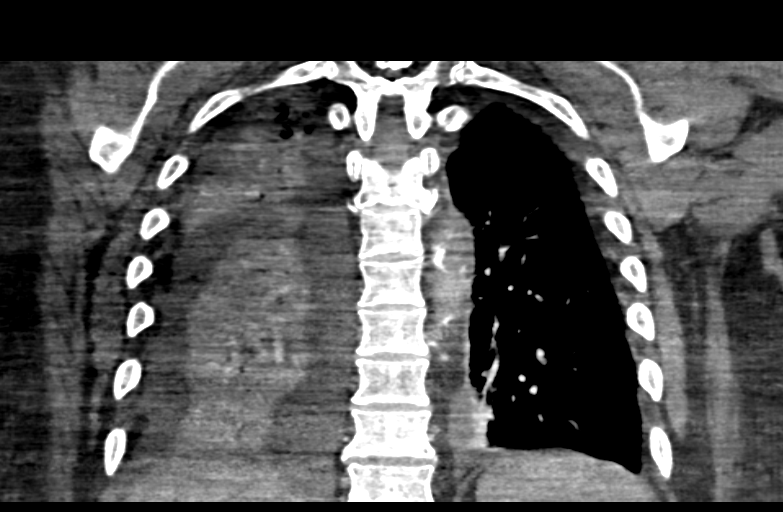

[16 of 46 positions shown; findings below may reference images not displayed]

FINDINGS: Mediastinum: Unremarkable CT appearance of the thyroid gland. No
suspicious mediastinal or hilar adenopathy. No soft tissue
mediastinal mass. The thoracic esophagus is unremarkable.

Heart/Vascular: Adequate opacification of the pulmonary arteries to
the proximal subsegmental level. Filling defect within the left
upper lobar pulmonary artery consistent with acute PE. The main
pulmonary artery is borderline enlarged at 3 cm. Cardiomegaly.
Concentric hypertrophy of the left ventricle. No pericardial
effusion. Atherosclerotic calcifications noted throughout the
coronary arteries. Conventional 3 vessel arch with atherosclerotic
calcifications but no aneurysmal dilatation or dissection. No
evidence of right heart strain. The RV/LV ratio is 0.67.

Lungs/Pleura: Evaluation for pulmonary nodules is limited by
respiratory motion. Moderate right and small left layering pleural
effusions. There is associated compressive atelectasis of the right
greater than left lower lobes. Suggestion of peribronchovascular
distribution of ground-glass attenuation opacity in the anterior
aspect of the right lower lobe which could reflect pneumonia or
other infiltrate. Nonspecific focus of ground-glass attenuation
opacity in the periphery of the lingula measures 7 mm on image 43 of
series 6. Diffuse bronchial wall thickening.

Bones/Soft Tissues: No acute fracture or aggressive appearing lytic
or blastic osseous lesion.

Upper Abdomen: Unremarkable imaged upper abdomen.

Review of the MIP images confirms the above findings.
IMPRESSION: 1. Positive for pulmonary embolus. Relatively small volume of
embolus in the left upper lobar pulmonary artery.
2. Moderate right and small left layering pleural effusions with
associated right worse than left lower lobe compressive atelectasis.
3. Given the limitations of excessive respiratory motion, there is
ground-glass attenuation opacity in the anterior right lower lobe
concerning for bronchopneumonia or other acute
infectious/inflammatory process.
4. Nonspecific 7 mm ground-glass attenuation nodular opacity in the
periphery of the lingula may represent an additional focus of
infection/inflammation. Low grade adenocarcinoma is a less likely
possibility. Recommend attention on follow-up imaging following an
appropriate course of therapy.
5. Atherosclerosis including multivessel coronary artery disease
6. Concentric left ventricular hypertrophy. Query systemic
hypertension.
7. Cardiomegaly.
Critical Value/emergent results were called by telephone at the time
of interpretation on 09/06/2013 at [DATE] to Arnaldo Manuel Tumba, PA with [REDACTED]s, who verbally acknowledged these results.

## 2015-02-28 DIAGNOSIS — I509 Heart failure, unspecified: Secondary | ICD-10-CM | POA: Diagnosis not present

## 2015-04-05 ENCOUNTER — Emergency Department (HOSPITAL_COMMUNITY): Payer: Medicare Other

## 2015-04-05 ENCOUNTER — Observation Stay (HOSPITAL_COMMUNITY)
Admission: EM | Admit: 2015-04-05 | Discharge: 2015-04-09 | Disposition: A | Discharge status: Skilled Nursing Facility | Payer: Medicare Other | Attending: Internal Medicine | Admitting: Internal Medicine

## 2015-04-05 ENCOUNTER — Encounter (HOSPITAL_COMMUNITY): Payer: Self-pay

## 2015-04-05 DIAGNOSIS — E785 Hyperlipidemia, unspecified: Secondary | ICD-10-CM | POA: Insufficient documentation

## 2015-04-05 DIAGNOSIS — R05 Cough: Secondary | ICD-10-CM | POA: Diagnosis not present

## 2015-04-05 DIAGNOSIS — N3000 Acute cystitis without hematuria: Secondary | ICD-10-CM

## 2015-04-05 DIAGNOSIS — Z79899 Other long term (current) drug therapy: Secondary | ICD-10-CM | POA: Insufficient documentation

## 2015-04-05 DIAGNOSIS — Z89512 Acquired absence of left leg below knee: Secondary | ICD-10-CM | POA: Diagnosis not present

## 2015-04-05 DIAGNOSIS — I130 Hypertensive heart and chronic kidney disease with heart failure and stage 1 through stage 4 chronic kidney disease, or unspecified chronic kidney disease: Secondary | ICD-10-CM | POA: Diagnosis not present

## 2015-04-05 DIAGNOSIS — N390 Urinary tract infection, site not specified: Secondary | ICD-10-CM

## 2015-04-05 DIAGNOSIS — I10 Essential (primary) hypertension: Secondary | ICD-10-CM | POA: Diagnosis not present

## 2015-04-05 DIAGNOSIS — N179 Acute kidney failure, unspecified: Secondary | ICD-10-CM | POA: Diagnosis not present

## 2015-04-05 DIAGNOSIS — D631 Anemia in chronic kidney disease: Secondary | ICD-10-CM | POA: Diagnosis not present

## 2015-04-05 DIAGNOSIS — Z882 Allergy status to sulfonamides status: Secondary | ICD-10-CM | POA: Insufficient documentation

## 2015-04-05 DIAGNOSIS — R4182 Altered mental status, unspecified: Secondary | ICD-10-CM

## 2015-04-05 DIAGNOSIS — K219 Gastro-esophageal reflux disease without esophagitis: Secondary | ICD-10-CM | POA: Insufficient documentation

## 2015-04-05 DIAGNOSIS — G9340 Encephalopathy, unspecified: Principal | ICD-10-CM | POA: Insufficient documentation

## 2015-04-05 DIAGNOSIS — I739 Peripheral vascular disease, unspecified: Secondary | ICD-10-CM | POA: Insufficient documentation

## 2015-04-05 DIAGNOSIS — L89151 Pressure ulcer of sacral region, stage 1: Secondary | ICD-10-CM | POA: Insufficient documentation

## 2015-04-05 DIAGNOSIS — Z87891 Personal history of nicotine dependence: Secondary | ICD-10-CM | POA: Diagnosis not present

## 2015-04-05 DIAGNOSIS — Z7901 Long term (current) use of anticoagulants: Secondary | ICD-10-CM | POA: Insufficient documentation

## 2015-04-05 DIAGNOSIS — E1122 Type 2 diabetes mellitus with diabetic chronic kidney disease: Secondary | ICD-10-CM | POA: Diagnosis not present

## 2015-04-05 DIAGNOSIS — L8990 Pressure ulcer of unspecified site, unspecified stage: Secondary | ICD-10-CM | POA: Diagnosis not present

## 2015-04-05 DIAGNOSIS — N184 Chronic kidney disease, stage 4 (severe): Secondary | ICD-10-CM | POA: Insufficient documentation

## 2015-04-05 DIAGNOSIS — Z87440 Personal history of urinary (tract) infections: Secondary | ICD-10-CM | POA: Diagnosis not present

## 2015-04-05 DIAGNOSIS — R531 Weakness: Secondary | ICD-10-CM | POA: Diagnosis not present

## 2015-04-05 DIAGNOSIS — Z66 Do not resuscitate: Secondary | ICD-10-CM | POA: Insufficient documentation

## 2015-04-05 DIAGNOSIS — I5032 Chronic diastolic (congestive) heart failure: Secondary | ICD-10-CM | POA: Diagnosis not present

## 2015-04-05 DIAGNOSIS — R404 Transient alteration of awareness: Secondary | ICD-10-CM | POA: Diagnosis not present

## 2015-04-05 LAB — COMPREHENSIVE METABOLIC PANEL
ALT: 8 U/L — ABNORMAL LOW (ref 17–63)
ANION GAP: 4 — AB (ref 5–15)
AST: 13 U/L — ABNORMAL LOW (ref 15–41)
Albumin: 2.6 g/dL — ABNORMAL LOW (ref 3.5–5.0)
Alkaline Phosphatase: 93 U/L (ref 38–126)
BILIRUBIN TOTAL: 0.4 mg/dL (ref 0.3–1.2)
BUN: 33 mg/dL — ABNORMAL HIGH (ref 6–20)
CO2: 20 mmol/L — ABNORMAL LOW (ref 22–32)
Calcium: 8 mg/dL — ABNORMAL LOW (ref 8.9–10.3)
Chloride: 119 mmol/L — ABNORMAL HIGH (ref 101–111)
Creatinine, Ser: 2.01 mg/dL — ABNORMAL HIGH (ref 0.61–1.24)
GFR calc Af Amer: 37 mL/min — ABNORMAL LOW (ref >60)
GFR, EST NON AFRICAN AMERICAN: 32 mL/min — AB (ref >60)
Glucose, Bld: 171 mg/dL — ABNORMAL HIGH (ref 65–99)
Potassium: 4.7 mmol/L (ref 3.5–5.1)
Sodium: 143 mmol/L (ref 135–145)
TOTAL PROTEIN: 6.3 g/dL — AB (ref 6.5–8.1)

## 2015-04-05 LAB — CBC WITH DIFFERENTIAL/PLATELET
Basophils Absolute: 0 10*3/uL (ref 0.0–0.1)
Basophils Relative: 0 %
Eosinophils Absolute: 0.5 10*3/uL (ref 0.0–0.7)
Eosinophils Relative: 7 %
HEMATOCRIT: 29.3 % — AB (ref 39.0–52.0)
HEMOGLOBIN: 9.6 g/dL — AB (ref 13.0–17.0)
Lymphocytes Relative: 23 %
Lymphs Abs: 1.6 10*3/uL (ref 0.7–4.0)
MCH: 27.2 pg (ref 26.0–34.0)
MCHC: 32.8 g/dL (ref 30.0–36.0)
MCV: 83 fL (ref 78.0–100.0)
MONO ABS: 0.4 10*3/uL (ref 0.1–1.0)
MONOS PCT: 5 %
Neutro Abs: 4.5 10*3/uL (ref 1.7–7.7)
Neutrophils Relative %: 65 %
Platelets: 236 10*3/uL (ref 150–400)
RBC: 3.53 MIL/uL — ABNORMAL LOW (ref 4.22–5.81)
RDW: 14.1 % (ref 11.5–15.5)
WBC: 6.9 10*3/uL (ref 4.0–10.5)

## 2015-04-05 LAB — I-STAT CG4 LACTIC ACID, ED: LACTIC ACID, VENOUS: 0.68 mmol/L (ref 0.5–2.0)

## 2015-04-05 LAB — URINALYSIS, ROUTINE W REFLEX MICROSCOPIC
BILIRUBIN URINE: NEGATIVE
GLUCOSE, UA: NEGATIVE mg/dL
KETONES UR: NEGATIVE mg/dL
NITRITE: NEGATIVE
Protein, ur: >300 mg/dL — AB
Specific Gravity, Urine: 1.012 (ref 1.005–1.030)
Urobilinogen, UA: 0.2 mg/dL (ref 0.0–1.0)
pH: 5.5 (ref 5.0–8.0)

## 2015-04-05 LAB — GLUCOSE, CAPILLARY
Glucose-Capillary: 145 mg/dL — ABNORMAL HIGH (ref 65–99)
Glucose-Capillary: 181 mg/dL — ABNORMAL HIGH (ref 65–99)

## 2015-04-05 LAB — URINE MICROSCOPIC-ADD ON

## 2015-04-05 LAB — I-STAT TROPONIN, ED: Troponin i, poc: 0.02 ng/mL (ref 0.00–0.08)

## 2015-04-05 LAB — BRAIN NATRIURETIC PEPTIDE: B Natriuretic Peptide: 175.9 pg/mL — ABNORMAL HIGH (ref 0.0–100.0)

## 2015-04-05 MED ORDER — APIXABAN 2.5 MG PO TABS
2.5000 mg | ORAL_TABLET | Freq: Two times a day (BID) | ORAL | Status: DC
  Administered 2015-04-05 – 2015-04-09 (×9): 2.5 mg via ORAL
  Filled 2015-04-05 (×9): qty 1

## 2015-04-05 MED ORDER — CARVEDILOL 12.5 MG PO TABS
12.5000 mg | ORAL_TABLET | Freq: Two times a day (BID) | ORAL | Status: DC
  Administered 2015-04-05 – 2015-04-09 (×9): 12.5 mg via ORAL
  Filled 2015-04-05 (×11): qty 1

## 2015-04-05 MED ORDER — SODIUM CHLORIDE 0.9 % IV SOLN
INTRAVENOUS | Status: DC
  Administered 2015-04-05: 75 mL/h via INTRAVENOUS
  Administered 2015-04-06 – 2015-04-07 (×2): via INTRAVENOUS

## 2015-04-05 MED ORDER — CLONIDINE HCL 0.1 MG PO TABS
0.1000 mg | ORAL_TABLET | Freq: Three times a day (TID) | ORAL | Status: DC
  Administered 2015-04-05 – 2015-04-09 (×12): 0.1 mg via ORAL
  Filled 2015-04-05 (×13): qty 1

## 2015-04-05 MED ORDER — TAMSULOSIN HCL 0.4 MG PO CAPS
0.4000 mg | ORAL_CAPSULE | Freq: Every day | ORAL | Status: DC
  Administered 2015-04-06 – 2015-04-09 (×4): 0.4 mg via ORAL
  Filled 2015-04-05 (×4): qty 1

## 2015-04-05 MED ORDER — CIPROFLOXACIN IN D5W 400 MG/200ML IV SOLN
400.0000 mg | Freq: Two times a day (BID) | INTRAVENOUS | Status: DC
  Administered 2015-04-05 – 2015-04-07 (×4): 400 mg via INTRAVENOUS
  Filled 2015-04-05 (×5): qty 200

## 2015-04-05 MED ORDER — INSULIN ASPART 100 UNIT/ML ~~LOC~~ SOLN
0.0000 [IU] | Freq: Three times a day (TID) | SUBCUTANEOUS | Status: DC
  Administered 2015-04-06 (×2): 1 [IU] via SUBCUTANEOUS
  Administered 2015-04-06 – 2015-04-07 (×2): 2 [IU] via SUBCUTANEOUS
  Administered 2015-04-07 – 2015-04-08 (×3): 1 [IU] via SUBCUTANEOUS
  Administered 2015-04-08: 3 [IU] via SUBCUTANEOUS
  Administered 2015-04-09 (×2): 1 [IU] via SUBCUTANEOUS
  Administered 2015-04-09: 2 [IU] via SUBCUTANEOUS

## 2015-04-05 NOTE — ED Provider Notes (Signed)
CSN: [id number]     Arrival date & time 04/05/15  1343 History   First MD Initiated Contact with Patient 04/05/15 1345     Chief Complaint  Patient presents with  . Weakness    Patient is a 69 y.o. male presenting with weakness.  Weakness Associated symptoms include weakness.    [name] is an 69 y.o. male with complex medical history including DM, HTN, HLD, CHF, CKD IV, PVD who presents to the ED via EMS for evaluation of weakness, confusion, urinary problems, and confusion. Pt was initially seen without his wife and was drowsy but arousable on interview. He was oriented to person only but stated the year was "19-something" and could not say where we were. His chief complaint to me was "I feel weak." Upon further questioning he stated he has sores on his behind as well on his penis. He denies any other complaints.  His wife arrived later and reports that she called EMS because she feels pt has been declining in health over the past month, she called PCP and they told her to bring him to ER. At baseline pt is bedbound with very limited mobility. Pt's wife reports that he was a hospice pt for 18 months until one month ago when they did not renew his hospice contract. Since then she has been taking care of him on her own. She reports that he has had some confusion for the past year or so but thinks it is getting significantly worse over the past month. She reports that he sometimes will hallucinate people that are not there and she will find him talking to himself. She states that at home he uses a condom cath and she has noticed blood and a milky quality to his urine. She states that he has a "split" on his penis that has been getting larger and larger. She reports that he is not eating as much as he normally does and seems to be much weaker. Pt's wife also reports that he has been having several loose stools a day for the past week or so. She states that she cannot take care of him by herself and  would like pt to be admitted to the hospital today or she needs more help at home.    Past Medical History  Diagnosis Date  . Diabetes mellitus   . Hypertension   . Hyperlipidemia   . CHF (congestive heart failure) (HCC)   . Enlarged heart   . Pneumonia     hx of   . Osteomyelitis of foot (HCC)     LT  . CKD (chronic kidney disease), stage IV (HCC)   . Shortness of breath   . GERD (gastroesophageal reflux disease)    Past Surgical History  Procedure Laterality Date  . Right leg fracture    . Skin graft    . Knee surgery    . Tee without cardioversion  03/17/2012    Procedure: TRANSESOPHAGEAL ECHOCARDIOGRAM (TEE);  Surgeon: Ricki Rodriguez, MD;  Location: [facility] ENDOSCOPY;  Service: Cardiovascular;  Laterality: N/A;  . Incision and drainage Left 07/02/2013    Procedure: INCISION AND DRAINAGE LEFT FOOT;  Surgeon: Kathryne Hitch, MD;  Location: [facility] ORS;  Service: Orthopedics;  Laterality: Left;  . Amputation Left 07/18/2013    Procedure: AMPUTATION BELOW KNEE;  Surgeon: Kathryne Hitch, MD;  Location: [facility] OR;  Service: Orthopedics;  Laterality: Left;   Family History  Problem Relation Age of Onset  . Heart  attack Father   . Heart attack Mother   . Renal Disease Sister     one of his sisters needed hemodialysis  . Diabetes Brother    Social History  Substance Use Topics  . Smoking status: Former Smoker    Quit date: 03/10/1984  . Smokeless tobacco: Never Used  . Alcohol Use: No    Review of Systems  Unable to perform ROS: Mental status change  Neurological: Positive for weakness.      Allergies  Ancef and Sulfa antibiotics  Home Medications   Prior to Admission medications   Medication Sig Start Date End Date Taking? Authorizing Provider  amLODipine (NORVASC) 5 MG tablet Take 5 mg by mouth daily.   Yes Historical Provider, MD  apixaban (ELIQUIS) 2.5 MG TABS tablet Take 2.5 mg by mouth 2 (two) times daily.   Yes Historical Provider, MD  carvedilol  (COREG) 12.5 MG tablet Take 1 tablet (12.5 mg total) by mouth 2 (two) times daily with a meal. 03/14/13  Yes Rinaldo CloudMohan Harwani, MD  cloNIDine (CATAPRES) 0.1 MG tablet Take 0.1 mg by mouth 3 (three) times daily.    Yes Historical Provider, MD  furosemide (LASIX) 40 MG tablet Take 40 mg by mouth daily.   Yes Historical Provider, MD  hydrOXYzine (ATARAX/VISTARIL) 10 MG tablet Take 10 mg by mouth 3 (three) times daily as needed for itching.   Yes Historical Provider, MD  insulin aspart (NOVOLOG) 100 UNIT/ML injection Inject into the skin 3 (three) times daily with meals. SSI 200-300   7 UNITS; 301-400  9 UNITS; 401-500   12  UNITS; > 500 14  UNITS AND CALL MD   CBG AC AND QHS 07/04/13  Yes Alison MurrayAlma M Devine, MD  ondansetron (ZOFRAN) 4 MG tablet Take 1 tablet (4 mg total) by mouth every 6 (six) hours as needed for nausea. 07/04/13  Yes Alison MurrayAlma M Devine, MD  potassium chloride (K-DUR) 10 MEQ tablet Take 10 mEq by mouth 2 (two) times daily.   Yes Historical Provider, MD  Tamsulosin HCl (FLOMAX) 0.4 MG CAPS Take 1 capsule (0.4 mg total) by mouth daily. 04/01/12  Yes Rinaldo CloudMohan Harwani, MD  furosemide (LASIX) 20 MG tablet Take 1 tablet (20 mg total) by mouth daily. Patient not taking: Reported on 04/05/2015 03/28/14   Zannie CovePreetha Joseph, MD   BP 119/104 mmHg  Pulse 77  Temp(Src) 98.8 F (37.1 C) (Oral)  Resp 18  SpO2 100% Physical Exam  Constitutional:  Chronically ill appear. Drowsy but arousable. NAD.   HENT:  Right Ear: External ear normal.  Left Ear: External ear normal.  Nose: Nose normal.  Mouth/Throat: Oropharynx is clear and moist. No oropharyngeal exudate.  Eyes: Conjunctivae and EOM are normal. Pupils are equal, round, and reactive to light.  Neck: Normal range of motion. Neck supple.  Cardiovascular: Normal rate, regular rhythm and normal heart sounds.   No murmur heard. Pulmonary/Chest: Effort normal and breath sounds normal. No stridor. No respiratory distress.  Difficult to auscultate as pt would not or  could not take deep breaths. No overt adventitious sounds on my exam.  Abdominal: Soft. Bowel sounds are normal. He exhibits no distension.  Genitourinary: Phimosis and penile erythema present.  Foreskin unretractable. Swollen and slightly erythematous.   Musculoskeletal:  Significantly decreased upper extremity strength. 2+ pitting edema LE bilaterally. S/p L BKA and R toe amputation  Lymphadenopathy:    He has no cervical adenopathy.  Neurological:  Oriented to person only. Drowsy but arousable.   Skin:  Skin is warm and dry.  Nursing note and vitals reviewed.   ED Course  Procedures (including critical care time) Labs Review Labs Reviewed  CBC WITH DIFFERENTIAL/PLATELET - Abnormal; Notable for the following:    RBC 3.53 (*)    Hemoglobin 9.6 (*)    HCT 29.3 (*)    All other components within normal limits  COMPREHENSIVE METABOLIC PANEL - Abnormal; Notable for the following:    Chloride 119 (*)    CO2 20 (*)    Glucose, Bld 171 (*)    BUN 33 (*)    Creatinine, Ser 2.01 (*)    Calcium 8.0 (*)    Total Protein 6.3 (*)    Albumin 2.6 (*)    AST 13 (*)    ALT 8 (*)    GFR calc non Af Amer 32 (*)    GFR calc Af Amer 37 (*)    Anion gap 4 (*)    All other components within normal limits  BRAIN NATRIURETIC PEPTIDE - Abnormal; Notable for the following:    B Natriuretic Peptide 175.9 (*)    All other components within normal limits  URINALYSIS, ROUTINE W REFLEX MICROSCOPIC (NOT AT [facility])  I-STAT CG4 LACTIC ACID, ED  Rosezena Sensor, ED    Imaging Review Dg Chest Portable 1 View  04/05/2015  CLINICAL DATA:  Milky and foul-smelling urine. Weakness. Coffee ground stools. Chronic cough. EXAM: PORTABLE CHEST 1 VIEW COMPARISON:  03/26/2014 FINDINGS: Mild elevation of the right hemidiaphragm is unchanged. Lungs are clear without airspace disease or pulmonary edema. Heart and mediastinum are within normal limits. Negative for a pneumothorax. No acute bone abnormality. IMPRESSION: No  acute chest findings. Electronically Signed   By: Richarda Overlie M.D.   On: 04/05/2015 15:11   I have personally reviewed and evaluated these images and lab results as part of my medical decision-making.   EKG Interpretation   Date/Time:  Friday April 05 2015 14:40:02 EDT Ventricular Rate:  74 PR Interval:  166 QRS Duration: 98 QT Interval:  410 QTC Calculation: 455 R Axis:   8 Text Interpretation:  Sinus rhythm Abnormal T, consider ischemia, diffuse  leads ST elevation anteriorly Baseline wander in lead(s) V1 V3 No  significant change since last tracing Confirmed by Anitra Lauth  MD, Alphonzo Lemmings  [phone]) on 04/05/2015 3:37:02 PM      MDM   Final diagnoses:  Urinary tract infection without hematuria, site unspecified  Altered mental status, unspecified altered mental status type  Acute kidney injury (HCC)   On in and out cath pt's bladder is full of pus. His labs show Cr elevated to 2.01. Otherwise not markedly abnormal. However, given pt's mental status, UTI, Cr elevation, and co-morbidities, will consult medicine for admission.  Spoke to Dr. Blake Divine. Will admit pt to med-surg.      Carlene Coria, PA-C 04/05/15 1607  Gwyneth Sprout, MD 04/10/15 [phone]

## 2015-04-05 NOTE — ED Notes (Signed)
Bed: WA02 Expected date:  Expected time:  Means of arrival:  Comments: EMS-weakness 

## 2015-04-05 NOTE — H&P (Addendum)
[facility] History and Physical  Justin HansenDonald Vossler Sr. ZOX:[id number] DOB: 01/18/1946 DOA: 04/05/2015  Referring physician: EDP PCP: Evlyn CourierHILL,[name], MD   Chief Complaint: confusion  HPI: Justin HansenDonald Roy Sr. is a 69 y.o. male with prior h/o chronic diastolic heart failure, stage 4 CKD, type 2 DM, hypertension, recently discharged from the hospital, presents today with his wife for confusion. [facility], less than a month ago.  As per the wife, his appetite has gone down, and he has been more confused than baseline and weak, he is mostly bedbound from over an year . She also reports some loose stools.  She denies fevers or vomiting.  His UA was found to be abnormal and he was referred tomedical service for admission for UTI and acute encephalopathy.   Review of Systems:  Confused could not be obtained.  Wife at bedside and updated .   Past Medical History  Diagnosis Date  . Diabetes mellitus   . Hypertension   . Hyperlipidemia   . CHF (congestive heart failure) (HCC)   . Enlarged heart   . Pneumonia     hx of   . Osteomyelitis of foot (HCC)     LT  . CKD (chronic kidney disease), stage IV (HCC)   . Shortness of breath   . GERD (gastroesophageal reflux disease)    Past Surgical History  Procedure Laterality Date  . Right leg fracture    . Skin graft    . Knee surgery    . Tee without cardioversion  03/17/2012    Procedure: TRANSESOPHAGEAL ECHOCARDIOGRAM (TEE);  Surgeon: Ricki RodriguezAjay S Kadakia, MD;  [facility];  Service: Cardiovascular;  Laterality: N/A;  . Incision and drainage Left 07/02/2013    Procedure: INCISION AND DRAINAGE LEFT FOOT;  Surgeon: Kathryne Hitchhristopher Y Blackman, MD;  Location: [facility] ORS;  Service: Orthopedics;  Laterality: Left;  . Amputation Left 07/18/2013    Procedure: AMPUTATION BELOW KNEE;  Surgeon: Kathryne Hitchhristopher Y Blackman, MD;  [facility];  Service: Orthopedics;  Laterality: Left;   Social History:  reports  that he quit smoking about 31 years ago. He has never used smokeless tobacco. He reports that he does not drink alcohol or use illicit drugs.  Allergies  Allergen Reactions  . Ancef [Cefazolin] Itching and Rash  . Sulfa Antibiotics Itching and Rash    Family History  Problem Relation Age of Onset  . Heart attack Father   . Heart attack Mother   . Renal Disease Sister     one of his sisters needed hemodialysis  . Diabetes Brother     Prior to Admission medications   Medication Sig Start Date End Date Taking? Authorizing Provider  amLODipine (NORVASC) 5 MG tablet Take 5 mg by mouth daily.   Yes Historical Provider, MD  apixaban (ELIQUIS) 2.5 MG TABS tablet Take 2.5 mg by mouth 2 (two) times daily.   Yes Historical Provider, MD  carvedilol (COREG) 12.5 MG tablet Take 1 tablet (12.5 mg total) by mouth 2 (two) times daily with a meal. 03/14/13  Yes Rinaldo CloudMohan Harwani, MD  cloNIDine (CATAPRES) 0.1 MG tablet Take 0.1 mg by mouth 3 (three) times daily.    Yes Historical Provider, MD  furosemide (LASIX) 40 MG tablet Take 40 mg by mouth daily.   Yes Historical Provider, MD  hydrOXYzine (ATARAX/VISTARIL) 10 MG tablet Take 10 mg by mouth 3 (three) times daily as needed for itching.   Yes Historical Provider, MD  insulin aspart (NOVOLOG)  100 UNIT/ML injection Inject into the skin 3 (three) times daily with meals. SSI 200-300   7 UNITS; 301-400  9 UNITS; 401-500   12  UNITS; > 500 14  UNITS AND CALL MD   CBG AC AND QHS 07/04/13  Yes Alison Murray, MD  ondansetron (ZOFRAN) 4 MG tablet Take 1 tablet (4 mg total) by mouth every 6 (six) hours as needed for nausea. 07/04/13  Yes Alison Murray, MD  potassium chloride (K-DUR) 10 MEQ tablet Take 10 mEq by mouth 2 (two) times daily.   Yes Historical Provider, MD  Tamsulosin HCl (FLOMAX) 0.4 MG CAPS Take 1 capsule (0.4 mg total) by mouth daily. 04/01/12  Yes Rinaldo Cloud, MD  furosemide (LASIX) 20 MG tablet Take 1 tablet (20 mg total) by mouth daily. Patient not  taking: Reported on 04/05/2015 03/28/14   Zannie Cove, MD   Physical Exam: Filed Vitals:   04/05/15 1400 04/05/15 1547 04/05/15 1600 04/05/15 1715  BP: 133/64 160/76 128/56 149/75  Pulse: 76 75 73 72  Temp:    98.3 F (36.8 C)  TempSrc:    Oral  Resp:  17 18   Height:    6' (1.829 m)  Weight:    102.059 kg (225 lb)  SpO2: 100% 100% 100% 99%    Wt Readings from Last 3 Encounters:  04/05/15 102.059 kg (225 lb)  03/27/14 98.2 kg (216 lb 7.9 oz)  09/11/13 104.463 kg (230 lb 4.8 oz)    General:  Appears calm and comfortable Eyes: PERRL, normal lids, irises & conjunctiva Neck: no LAD, masses or thyromegaly Cardiovascular: RRR, no m/r/g.  Respiratory: CTA bilaterally, no w/r/r. Normal respiratory effort. Abdomen: soft, ntnd Skin: no rash or induration seen on limited exam Musculoskeletal: BKA LEFT,. Trace pedal edema.  Neurologic: grossly non-focal.          Labs on Admission:  Basic Metabolic Panel:  Recent Labs Lab 04/05/15 1438  NA 143  K 4.7  CL 119*  CO2 20*  GLUCOSE 171*  BUN 33*  CREATININE 2.01*  CALCIUM 8.0*   Liver Function Tests:  Recent Labs Lab 04/05/15 1438  AST 13*  ALT 8*  ALKPHOS 93  BILITOT 0.4  PROT 6.3*  ALBUMIN 2.6*   No results for input(s): LIPASE, AMYLASE in the last 168 hours. No results for input(s): AMMONIA in the last 168 hours. CBC:  Recent Labs Lab 04/05/15 1438  WBC 6.9  NEUTROABS 4.5  HGB 9.6*  HCT 29.3*  MCV 83.0  PLT 236   Cardiac Enzymes: No results for input(s): CKTOTAL, CKMB, CKMBINDEX, TROPONINI in the last 168 hours.  BNP (last 3 results)  Recent Labs  04/05/15 1439  BNP 175.9*    ProBNP (last 3 results) No results for input(s): PROBNP in the last 8760 hours.  CBG:  Recent Labs Lab 04/05/15 1806  GLUCAP 145*    Radiological Exams on Admission: Dg Chest Portable 1 View  04/05/2015  CLINICAL DATA:  Milky and foul-smelling urine. Weakness. Coffee ground stools. Chronic cough. EXAM:  PORTABLE CHEST 1 VIEW COMPARISON:  03/26/2014 FINDINGS: Mild elevation of the right hemidiaphragm is unchanged. Lungs are clear without airspace disease or pulmonary edema. Heart and mediastinum are within normal limits. Negative for a pneumothorax. No acute bone abnormality. IMPRESSION: No acute chest findings. Electronically Signed   By: Richarda Overlie M.D.   On: 04/05/2015 15:11    ZOX:[id number] reviewed. Sinus rhythm.   Assessment/Plan Active Problems:   Acute encephalopathy  Urinary tract infection   Pressure ulcer   Acute encephalopathy: probably from infection.  - as per the wife he has been like that off and on for a few months now.    UTI: Abnormal UA, frequent urination, urine cultures sent.  Afebrile, no leukocytosis and normal lactic acid.  Start on IV ciprofloxacin.   Acute on CKD STAGE 4;  stop lasix and gentle hydration overnight.   Anemia; normocytic, probably from chronic disease.   Chronic diastolic heart failure.  Compensated.   Diabetes mellitus: SSI.   Pulmonary embolism : On anticoagulation. Hemoglobin stable.   Pvd; S/p left BKA , stump looks okay. No open wound .    Code Status: DNR.  DVT Prophylaxis: Family Communication: WIFE AT BEDSIDE.  Disposition Plan: PENDING PT eval Time spent: 55 min  [facility] Pager [phone]

## 2015-04-05 NOTE — Progress Notes (Deleted)
PHARMACY NOTE -  Rocephin  Pharmacy has been assisting with dosing of Rocephin for UTI. Will order and dose will remain stable at 1g q24 and need for further dosage adjustment appears unlikely at present.    Will sign off at this time.  Please reconsult if a change in clinical status warrants re-evaluation of dosage.   Hessie KnowsJustin M Iren Whipp, PharmD, BCPS Pager [phone] 04/05/2015 6:35 PM

## 2015-04-05 NOTE — ED Notes (Signed)
Patient presents to ED with milky/bloody foul smelling urine, increased weakness, lack of appetite, coffee ground stools x1week. Wife states ulcers/sores on buttocks and genitalia.

## 2015-04-05 NOTE — Progress Notes (Addendum)
No longer with [facility] Last provided services about a month ago per family (2 females at bedside) Reports to hard to get pt to pcp office Family stated pcp recommended bringing pt to hospital Had aide Marissa visiting Dr from hospice came to vis and pt was released on Friday September 16th or 17th of 2016 NO home heath  services seeing pt  Rep DME hospital bed and wheelchair and bedside table Reports pt totally bed ridden Does not get up out of bed Paying for CNA out of pocket 3 time a week  Had oxygen with [facility] but picked up when released from hospice. Anticipated d/c home. Offered home health list but wife states she was given 2 lists by palliative care staff  CM discussed doctor home visit since wife mentioned tasking to get pt to pcp office Entered information in d/c instructions [facility] [address], [address] [place], [place] [address] Dr Florentina JennyHenry Tripp --[phone] fax [phone] to [facility] [address] [place] [address] [phone] Doctors making house calls [address] [place], [place], [place] [address] [phone]

## 2015-04-05 NOTE — Progress Notes (Signed)
ANTIBIOTIC CONSULT NOTE - INITIAL  Pharmacy Consult for Cipro Indication: UTI  Allergies  Allergen Reactions  . Ancef [Cefazolin] Itching and Rash  . Sulfa Antibiotics Itching and Rash    Patient Measurements: Height: 6' (182.9 cm) Weight: 225 lb (102.059 kg) IBW/kg (Calculated) : 77.6  Vital Signs: Temp: 98.3 F (36.8 C) (10/28 1715) Temp Source: Oral (10/28 1715) BP: 149/75 mmHg (10/28 1715) Pulse Rate: 72 (10/28 1715) Intake/Output from previous day:   Intake/Output from this shift:    Labs:  Recent Labs  04/05/15 1438  WBC 6.9  HGB 9.6*  PLT 236  CREATININE 2.01*   Estimated Creatinine Clearance: 42.3 mL/min (by C-G formula based on Cr of 2.01). No results for input(s): VANCOTROUGH, VANCOPEAK, VANCORANDOM, GENTTROUGH, GENTPEAK, GENTRANDOM, TOBRATROUGH, TOBRAPEAK, TOBRARND, AMIKACINPEAK, AMIKACINTROU, AMIKACIN in the last 72 hours.   Microbiology: No results found for this or any previous visit (from the past 720 hour(s)).  Medical History: Past Medical History  Diagnosis Date  . Diabetes mellitus   . Hypertension   . Hyperlipidemia   . CHF (congestive heart failure) (HCC)   . Enlarged heart   . Pneumonia     hx of   . Osteomyelitis of foot (HCC)     LT  . CKD (chronic kidney disease), stage IV (HCC)   . Shortness of breath   . GERD (gastroesophageal reflux disease)     Medications:  Scheduled:  . apixaban  2.5 mg Oral BID  . carvedilol  12.5 mg Oral BID WC  . cloNIDine  0.1 mg Oral TID  . [START ON 04/06/2015] tamsulosin  0.4 mg Oral Daily   Infusions:  . sodium chloride     Assessment: 69 yo presented to ER with weakness to start Cipro per pharmacy dosing for possible UTI. Note allergies to Ancef and Sulfa Abx's. Afebrile, SCr 2.01 with CrCl 42 and WBC 6.9 at baseline  Goal of Therapy:  treatment of UTI  Plan:  Cipro 400mg  IV q12 for CrCl > 30 ml/min   Hessie KnowsJustin M Darnesha Diloreto, PharmD, BCPS Pager [phone] 04/05/2015 6:43 PM

## 2015-04-06 DIAGNOSIS — N3000 Acute cystitis without hematuria: Secondary | ICD-10-CM | POA: Diagnosis not present

## 2015-04-06 DIAGNOSIS — G9340 Encephalopathy, unspecified: Secondary | ICD-10-CM | POA: Diagnosis not present

## 2015-04-06 LAB — GLUCOSE, CAPILLARY
GLUCOSE-CAPILLARY: 123 mg/dL — AB (ref 65–99)
Glucose-Capillary: 122 mg/dL — ABNORMAL HIGH (ref 65–99)
Glucose-Capillary: 155 mg/dL — ABNORMAL HIGH (ref 65–99)

## 2015-04-06 NOTE — Progress Notes (Signed)
NT reported pt recently vomited immeasurable amt of undigested food in bed stating "pt just had lunch and was now lying down." NT cleaned pt up and changed gown and linen. Pt assessed and no further complaints of nausea or vomiting noted. HOB raised to 30 degrees. Offered antiemetic yet pt refused stating " I'm all right." No other complaints voiced. Refused to turn in bed at this time. Importance of moving into different position stressed yet continued to refuse.

## 2015-04-06 NOTE — Progress Notes (Signed)
[facility] PROGRESS NOTE  Justin HansenDonald Buckhalter Sr. ZOX:[id number] DOB: 09/16/1945 DOA: 04/05/2015 PCP: Evlyn CourierHILL,[name], MD Brief history: Justin Hansenonald Housel Sr. is a 69 y.o. male with prior h/o chronic diastolic heart failure, stage 4 CKD, type 2 DM, hypertension, recently discharged from the hospital, presents today with his wife for confusion. Assessment/Plan: Acute encephalopathy: probably from infection.  - as per the wife he has been like that off and on for a few months now.  Appears to be improving, he is more alert and awake today.    UTI: Abnormal UA, frequent urination, urine cultures sent.  Afebrile, no leukocytosis and normal lactic acid.  Started on IV ciprofloxacin.   Acute on CKD STAGE 4; stop lasix and gentle hydration overnight.  Repeat renal parameter this am.   Anemia; normocytic, probably from chronic disease.   Chronic diastolic heart failure.  Compensated.   Diabetes mellitus: SSI.   Pulmonary embolism : On anticoagulation. Hemoglobin stable.   Pvd; S/p left BKA , stump looks okay. No open wound .   Code Status: DNR Family Communication: NONE at bedside today. Disposition Plan: pending hospice and PT eval.   Consultants: Hospice Physical therapy.  Procedures: none Antibiotics: IV cipro HPI/Subjective: No new complaints.   Objective: Filed Vitals:   04/06/15 0540  BP: 139/80  Pulse: 82  Temp: 98.6 F (37 C)  Resp: 18    Intake/Output Summary (Last 24 hours) at 04/06/15 1109 Last data filed at 04/06/15 0643  Gross per 24 hour  Intake 1403.75 ml  Output   1300 ml  Net 103.75 ml   Filed Weights   04/05/15 1715  Weight: 102.059 kg (225 lb)    Exam:   General:  Alert afebrile comfortable  Cardiovascular: s1s2  Respiratory: ctab  Abdomen: soft NT nd bs+  Musculoskeletal: left BKA. Pedal edema present.   Data Reviewed: Basic Metabolic Panel:  Recent Labs Lab 04/05/15 1438  NA 143  K 4.7  CL 119*  CO2 20*   GLUCOSE 171*  BUN 33*  CREATININE 2.01*  CALCIUM 8.0*   Liver Function Tests:  Recent Labs Lab 04/05/15 1438  AST 13*  ALT 8*  ALKPHOS 93  BILITOT 0.4  PROT 6.3*  ALBUMIN 2.6*   No results for input(s): LIPASE, AMYLASE in the last 168 hours. No results for input(s): AMMONIA in the last 168 hours. CBC:  Recent Labs Lab 04/05/15 1438  WBC 6.9  NEUTROABS 4.5  HGB 9.6*  HCT 29.3*  MCV 83.0  PLT 236   Cardiac Enzymes: No results for input(s): CKTOTAL, CKMB, CKMBINDEX, TROPONINI in the last 168 hours. BNP (last 3 results)  Recent Labs  04/05/15 1439  BNP 175.9*    ProBNP (last 3 results) No results for input(s): PROBNP in the last 8760 hours.  CBG:  Recent Labs Lab 04/05/15 1806 04/05/15 2213 04/06/15 0737  GLUCAP 145* 181* 122*    No results found for this or any previous visit (from the past 240 hour(s)).   Studies: Dg Chest Portable 1 View  04/05/2015  CLINICAL DATA:  Milky and foul-smelling urine. Weakness. Coffee ground stools. Chronic cough. EXAM: PORTABLE CHEST 1 VIEW COMPARISON:  03/26/2014 FINDINGS: Mild elevation of the right hemidiaphragm is unchanged. Lungs are clear without airspace disease or pulmonary edema. Heart and mediastinum are within normal limits. Negative for a pneumothorax. No acute bone abnormality. IMPRESSION: No acute chest findings. Electronically Signed   By: Richarda OverlieAdam  Henn M.D.   On: 04/05/2015 15:11    Scheduled Meds: .  apixaban  2.5 mg Oral BID  . carvedilol  12.5 mg Oral BID WC  . ciprofloxacin  400 mg Intravenous Q12H  . cloNIDine  0.1 mg Oral TID  . insulin aspart  0-9 Units Subcutaneous TID WC  . tamsulosin  0.4 mg Oral Daily   Continuous Infusions: . sodium chloride 75 mL/hr at 04/06/15 1103    Active Problems:   Acute encephalopathy   Urinary tract infection   Pressure ulcer    Time spent: 15 minutes.     [facility]  [facility] Pager [phone] . If 7PM-7AM, please contact night-coverage at  [email or web address], password [facility] 04/06/2015, 11:09 AM

## 2015-04-06 NOTE — Progress Notes (Signed)
Pt agreed and was turned to left side. Assurance given. No complaints voiced.

## 2015-04-06 NOTE — Evaluation (Signed)
Physical Therapy Evaluation Patient Details Name: Justin Hillmer Sr. MRN: [id number] DOB: 11/05/1945 Today's Date: 04/06/2015   History of Present Illness  [name] is an 69 y.o. male with complex medical history including DM, HTN, HLD, CHF, CKD IV, PVD who presents to the ED via EMS for evaluation of weakness, confusion, urinary problems, and confusion.   Clinical Impression  Pt with limited participation in PT eval and agitation.  Pt stated PT could not call wife for any more information.  Based on speaking with pt, nurse, and reviewing chart, recommend SNF, as pt is requiring plus 2 assistance for bed mobility and requiring more assistance than wife can provide.  Will follow acutely to work towards increasing participation with bed mobility to allow for decreased burden of care for caregivers.    Follow Up Recommendations SNF    Equipment Recommendations  None recommended by PT    Recommendations for Other Services       Precautions / Restrictions Precautions Precautions: Fall      Mobility  Bed Mobility Overal bed mobility: +2 for physical assistance;Needs Assistance Bed Mobility: Rolling Rolling: Total assist;+2 for physical assistance         General bed mobility comments: Pt refused to roll stating "I can't do it." My wife has to do it. Per nursing he was TOT A of 2 for earlier rolling for cleaning.  Transfers                    Ambulation/Gait                Stairs            Wheelchair Mobility    Modified Rankin (Stroke Patients Only)       Balance                                             Pertinent Vitals/Pain Pain Assessment: No/denies pain    Home Living Family/patient expects to be discharged to:: Skilled nursing facility Living Arrangements: Spouse/significant other               Additional Comments: Pt lived at home with wife, where he was predominately bed bound.  Per pt he had aides  helping til recently and now it is just his wife.  Pt agitated with some questions and unable to get a clear answer.  Pt states he does get up sometimes to w/c with wife's A .     Prior Function Level of Independence: Needs assistance   Gait / Transfers Assistance Needed: Unable to get clear picture and pt stated that the therapist could not call his wife. States he was getting up occasionally to w/c with wife A.           Hand Dominance        Extremity/Trunk Assessment               Lower Extremity Assessment: LLE deficits/detail   LLE Deficits / Details: L AKA     Communication      Cognition Arousal/Alertness: Awake/alert Behavior During Therapy: Agitated Overall Cognitive Status: No family/caregiver present to determine baseline cognitive functioning                      General Comments      Exercises  Assessment/Plan    PT Assessment Patient needs continued PT services  PT Diagnosis Generalized weakness   PT Problem List Decreased activity tolerance;Decreased balance;Decreased mobility  PT Treatment Interventions Functional mobility training;Therapeutic activities;Therapeutic exercise   PT Goals (Current goals can be found in the Care Plan section) Acute Rehab PT Goals Patient Stated Goal: For PT to get out his room. PT Goal Formulation: Patient unable to participate in goal setting Time For Goal Achievement: 04/20/15 Potential to Achieve Goals: Fair    Frequency Min 2X/week   Barriers to discharge        Co-evaluation               End of Session   Activity Tolerance: Treatment limited secondary to agitation Patient left: in bed Nurse Communication: Mobility status    Functional Assessment Tool Used: clinical judgement Functional Limitation: Changing and maintaining body position Changing and Maintaining Body Position Current Status (Z6109(G8981): 100 percent impaired, limited or restricted Changing and Maintaining Body  Position Goal Status (U0454(G8982): At least 60 percent but less than 80 percent impaired, limited or restricted    Time: 1155-1205 PT Time Calculation (min) (ACUTE ONLY): 10 min   Charges:   PT Evaluation $Initial PT Evaluation Tier I: 1 Procedure     PT G Codes:   PT G-Codes **NOT FOR INPATIENT CLASS** Functional Assessment Tool Used: clinical judgement Functional Limitation: Changing and maintaining body position Changing and Maintaining Body Position Current Status (U9811(G8981): 100 percent impaired, limited or restricted Changing and Maintaining Body Position Goal Status (B1478(G8982): At least 60 percent but less than 80 percent impaired, limited or restricted    Maria Coin LUBECK 04/06/2015, 1:35 PM

## 2015-04-06 NOTE — Progress Notes (Signed)
Pt has  hematuria,reported to Cabin crewoncoming nurse.

## 2015-04-07 ENCOUNTER — Observation Stay (HOSPITAL_COMMUNITY): Payer: Medicare Other

## 2015-04-07 DIAGNOSIS — N3000 Acute cystitis without hematuria: Secondary | ICD-10-CM | POA: Diagnosis not present

## 2015-04-07 DIAGNOSIS — G9340 Encephalopathy, unspecified: Secondary | ICD-10-CM | POA: Diagnosis not present

## 2015-04-07 DIAGNOSIS — N179 Acute kidney failure, unspecified: Secondary | ICD-10-CM | POA: Diagnosis not present

## 2015-04-07 DIAGNOSIS — N390 Urinary tract infection, site not specified: Secondary | ICD-10-CM | POA: Diagnosis not present

## 2015-04-07 LAB — GLUCOSE, CAPILLARY
GLUCOSE-CAPILLARY: 181 mg/dL — AB (ref 65–99)
GLUCOSE-CAPILLARY: 119 mg/dL — AB (ref 65–99)
GLUCOSE-CAPILLARY: 139 mg/dL — AB (ref 65–99)
GLUCOSE-CAPILLARY: 168 mg/dL — AB (ref 65–99)
Glucose-Capillary: 156 mg/dL — ABNORMAL HIGH (ref 65–99)

## 2015-04-07 LAB — CBC
HCT: 25.2 % — ABNORMAL LOW (ref 39.0–52.0)
HEMOGLOBIN: 8.3 g/dL — AB (ref 13.0–17.0)
MCH: 27.8 pg (ref 26.0–34.0)
MCHC: 32.9 g/dL (ref 30.0–36.0)
MCV: 84.3 fL (ref 78.0–100.0)
Platelets: 208 10*3/uL (ref 150–400)
RBC: 2.99 MIL/uL — ABNORMAL LOW (ref 4.22–5.81)
RDW: 14.2 % (ref 11.5–15.5)
WBC: 7.3 10*3/uL (ref 4.0–10.5)

## 2015-04-07 LAB — BASIC METABOLIC PANEL
ANION GAP: 5 (ref 5–15)
BUN: 42 mg/dL — ABNORMAL HIGH (ref 6–20)
CALCIUM: 7.7 mg/dL — AB (ref 8.9–10.3)
CO2: 18 mmol/L — AB (ref 22–32)
Chloride: 118 mmol/L — ABNORMAL HIGH (ref 101–111)
Creatinine, Ser: 2.43 mg/dL — ABNORMAL HIGH (ref 0.61–1.24)
GFR calc Af Amer: 29 mL/min — ABNORMAL LOW (ref >60)
GFR calc non Af Amer: 25 mL/min — ABNORMAL LOW (ref >60)
GLUCOSE: 155 mg/dL — AB (ref 65–99)
Potassium: 4.8 mmol/L (ref 3.5–5.1)
Sodium: 141 mmol/L (ref 135–145)

## 2015-04-07 LAB — URINE CULTURE: Culture: >=100000

## 2015-04-07 MED ORDER — FLUCONAZOLE IN SODIUM CHLORIDE 200-0.9 MG/100ML-% IV SOLN
200.0000 mg | Freq: Every day | INTRAVENOUS | Status: DC
  Administered 2015-04-07: 200 mg via INTRAVENOUS
  Filled 2015-04-07: qty 100

## 2015-04-07 MED ORDER — ACETAMINOPHEN 325 MG PO TABS: 650.0000 mg | ORAL_TABLET | ORAL | Status: DC | PRN

## 2015-04-07 MED ORDER — TRAMADOL HCL 50 MG PO TABS
50.0000 mg | ORAL_TABLET | Freq: Four times a day (QID) | ORAL | Status: DC | PRN
  Administered 2015-04-07 – 2015-04-08 (×2): 50 mg via ORAL
  Filled 2015-04-07 (×2): qty 1

## 2015-04-07 MED ORDER — FLUCONAZOLE IN SODIUM CHLORIDE 100-0.9 MG/50ML-% IV SOLN
100.0000 mg | Freq: Every day | INTRAVENOUS | Status: DC
  Administered 2015-04-08 – 2015-04-09 (×2): 100 mg via INTRAVENOUS
  Filled 2015-04-07 (×2): qty 50

## 2015-04-07 NOTE — NC FL2 (Signed)
[place] MEDICAID FL2 LEVEL OF CARE SCREENING TOOL     IDENTIFICATION  Patient Name: Justin HansenDonald Verrette Sr. Birthdate: 12/28/1945 Sex: male Admission Date (Current Location): 04/05/2015  [facility] [place]:     Facility and Address:  [facility],  [address], [place] [id number]      Provider Number: [id number]  Attending Physician Name and Address:  Kathlen ModyVijaya Akula, MD  Relative Name and Phone Number:  [name], [phone]    Current Level of Care: Hospital Recommended Level of Care: Skilled Nursing Facility Prior Approval Number:    Date Approved/Denied:   PASRR Number: [id number]  Discharge Plan: SNF    Current Diagnoses: Patient Active Problem List   Diagnosis Date Noted  . Urinary tract infection 04/05/2015  . Pressure ulcer 04/05/2015  . Spell of abnormal behavior 03/26/2014  . Syncope 03/26/2014  . CKD (chronic kidney disease), stage IV (HCC) 03/26/2014  . HLD (hyperlipidemia) 03/26/2014  . History of pulmonary embolism 03/26/2014  . Hyperlipidemia   . Acute pulmonary embolism (HCC) 09/11/2013  . HCAP (healthcare-associated pneumonia) 09/05/2013  . S/P BKA (below knee amputation) unilateral (HCC) 07/24/2013  . Anemia following surgery 07/24/2013  . Acute encephalopathy 07/15/2013  . Hypotension 07/15/2013  . UTI (urinary tract infection) 07/15/2013  . DM (diabetes mellitus), type 2, uncontrolled, periph vascular complic (HCC) 07/05/2013  . Chronic renal failure, stage 4 (severe) (HCC) 07/05/2013  . Anemia of chronic disease 07/05/2013  . Open wound of foot except toes with complication 06/29/2013  . Cellulitis and abscess of foot 06/29/2013  . Hypertension 06/29/2013  . Pulmonary infiltrate 03/22/2013  . Atelectasis 03/22/2013  . Acute on chronic renal failure (HCC) 03/08/2013  . Hypernatremia 03/08/2013  . Anasarca 03/08/2013  . Physical deconditioning 03/08/2013  . Unable to ambulate 03/08/2013  . Diabetic peripheral  neuropathy (HCC) 03/08/2013  . Bilateral leg edema, L>R 03/08/2013  . Shock (HCC) 03/08/2013  . History of Bacteremia due to Staphylococcus 03/08/2013  . Tremor 03/08/2013  . Hypokalemia 03/08/2013  . Altered mental status 03/08/2013  . Slurred speech 11/26/2012  . Chronic diastolic CHF (congestive heart failure) (HCC) 08/04/2011  . Chronic hypertension 08/04/2011  . Pleural effusion, bilateral 08/04/2011  . DIABETES MELLITUS, WITH VASCULAR COMPLICATIONS 05/26/2010    Orientation ACTIVITIES/SOCIAL BLADDER RESPIRATION    Self, Time, Situation, Place  Family supportive Incontinent Normal  BEHAVIORAL SYMPTOMS/MOOD NEUROLOGICAL BOWEL NUTRITION STATUS  Other (Comment) (n/a)   Continent  (See discharge summary.)  PHYSICIAN VISITS COMMUNICATION OF NEEDS Height & Weight Skin    Verbally 6' (182.9 cm) 225 lbs. PU Stage and Appropriate Care   PU Stage 2 Dressing: No Dressing      AMBULATORY STATUS RESPIRATION     (Does not ambulate.) Normal      Personal Care Assistance Level of Assistance  Total care       Total Care Assistance: Maximum assistance    Functional Limitations Info                SPECIAL CARE FACTORS FREQUENCY  PT (By licensed PT), OT (By licensed OT) (Hospice patient from home.)     PT Frequency: 5 OT Frequency: 5           Additional Factors Info  Code Status, Allergies, Isolation Precautions, Insulin Sliding Scale Code Status Info: DNR Allergies Info: Ancef Cefazolin, Sulfa Antibiotics   Insulin Sliding Scale Info: 3 times a day Isolation Precautions Info: MRSA     Current Medications (04/07/2015): Current Facility-Administered Medications  Medication Dose Route Frequency Provider Last Rate Last Dose  . 0.9 %  sodium chloride infusion   Intravenous Continuous Kathlen Mody, MD 75 mL/hr at 04/07/15 0056    . acetaminophen (TYLENOL) tablet 650 mg  650 mg Oral Q4H PRN Kathlen Mody, MD      . apixaban (ELIQUIS) tablet 2.5 mg  2.5 mg Oral BID  Kathlen Mody, MD   2.5 mg at 04/07/15 1034  . carvedilol (COREG) tablet 12.5 mg  12.5 mg Oral BID WC Kathlen Mody, MD   12.5 mg at 04/07/15 0912  . cloNIDine (CATAPRES) tablet 0.1 mg  0.1 mg Oral TID Kathlen Mody, MD   0.1 mg at 04/07/15 1034  . [START ON 04/08/2015] fluconazole (DIFLUCAN) IVPB 100 mg  100 mg Intravenous Q1400 Kathlen Mody, MD      . insulin aspart (novoLOG) injection 0-9 Units  0-9 Units Subcutaneous TID WC Kathlen Mody, MD   1 Units at 04/07/15 1244  . tamsulosin (FLOMAX) capsule 0.4 mg  0.4 mg Oral Daily Kathlen Mody, MD   0.4 mg at 04/07/15 1037  . traMADol (ULTRAM) tablet 50 mg  50 mg Oral Q6H PRN Kathlen Mody, MD       Do not use this list as official medication orders. Please verify with discharge summary.  Discharge Medications:   Medication List    ASK your doctor about these medications        amLODipine 5 MG tablet  Commonly known as:  NORVASC  Take 5 mg by mouth daily.     apixaban 2.5 MG Tabs tablet  Commonly known as:  ELIQUIS  Take 2.5 mg by mouth 2 (two) times daily.     carvedilol 12.5 MG tablet  Commonly known as:  COREG  Take 1 tablet (12.5 mg total) by mouth 2 (two) times daily with a meal.     cloNIDine 0.1 MG tablet  Commonly known as:  CATAPRES  Take 0.1 mg by mouth 3 (three) times daily.     furosemide 40 MG tablet  Commonly known as:  LASIX  Take 40 mg by mouth daily.     furosemide 20 MG tablet  Commonly known as:  LASIX  Take 1 tablet (20 mg total) by mouth daily.     hydrOXYzine 10 MG tablet  Commonly known as:  ATARAX/VISTARIL  Take 10 mg by mouth 3 (three) times daily as needed for itching.     insulin aspart 100 UNIT/ML injection  Commonly known as:  novoLOG  Inject into the skin 3 (three) times daily with meals. SSI 200-300   7 UNITS; 301-400  9 UNITS; 401-500   12  UNITS; > 500 14  UNITS AND CALL MD   CBG AC AND QHS     ondansetron 4 MG tablet  Commonly known as:  ZOFRAN  Take 1 tablet (4 mg total) by mouth every 6  (six) hours as needed for nausea.     potassium chloride 10 MEQ tablet  Commonly known as:  K-DUR  Take 10 mEq by mouth 2 (two) times daily.     tamsulosin 0.4 MG Caps capsule  Commonly known as:  FLOMAX  Take 1 capsule (0.4 mg total) by mouth daily.        Relevant Imaging Results:  Relevant Lab Results:  Recent Labs    Additional Information    [name] [phone]

## 2015-04-07 NOTE — Progress Notes (Signed)
[facility] PROGRESS NOTE  Justin Voorheis Sr. [id number]:[id number] DOB: 1946-01-24 DOA: 04/05/2015 PCP: [name], MD Brief history: Justin Hansen Sr. is a 69 y.o. male with prior h/o chronic diastolic heart failure, stage 4 CKD, type 2 DM, hypertension, recently discharged from the hospital, presents today with his wife for confusion. Assessment/Plan: Acute encephalopathy: probably from infection.  - as per the wife he has been like that off and on for a few months now.  Appears to be improving, he is more alert and awake today.    UTI: Abnormal UA, frequent urination, urine cultures sent.  Afebrile, no leukocytosis and normal lactic acid.  Started on IV ciprofloxacin. Urine cultures grew yeast. Discontinued the ciprofloxacin and started him on diflucan.  Acute on CKD STAGE 4; Worsened with fluids.  Get US renal.  Monirow.   Anemia; normocytic, probably from chronic disease.   Chronic diastolic heart failure.  Compensated.   Diabetes mellitus: SSI.   Pulmonary embolism : On anticoagulation. Hemoglobin stable.   Pvd; S/p left BKA , stump looks okay. No open wound .   Code Status: DNR Family Communication: NONE at bedside today. Disposition Plan: pending hospice and PT eval.   Consultants: Hospice Physical therapy.  Procedures: none Antibiotics: IV cipro HPI/Subjective: No new complaints.   Objective: Filed Vitals:   04/07/15 1520  BP: 140/81  Pulse: 84  Temp: 98.3 F (36.8 C)  Resp: 18    Intake/Output Summary (Last 24 hours) at 04/07/15 1635 Last data filed at 04/07/15 1500  Gross per 24 hour  Intake   1680 ml  Output    475 ml  Net   1205 ml   Filed Weights   04/05/15 1715  Weight: 102.059 kg (225 lb)    Exam:   General:  Alert afebrile comfortable  Cardiovascular: s1s2  Respiratory: ctab  Abdomen: soft NT nd bs+  Musculoskeletal: left BKA. Pedal edema present.   Data Reviewed: Basic Metabolic Panel:  Recent  Labs Lab 04/05/15 1438 04/07/15 1503  NA 143 141  K 4.7 4.8  CL 119* 118*  CO2 20* 18*  GLUCOSE 171* 155*  BUN 33* 42*  CREATININE 2.01* 2.43*  CALCIUM 8.0* 7.7*   Liver Function Tests:  Recent Labs Lab 04/05/15 1438  AST 13*  ALT 8*  ALKPHOS 93  BILITOT 0.4  PROT 6.3*  ALBUMIN 2.6*   No results for input(s): LIPASE, AMYLASE in the last 168 hours. No results for input(s): AMMONIA in the last 168 hours. CBC:  Recent Labs Lab 04/05/15 1438 04/07/15 1503  WBC 6.9 7.3  NEUTROABS 4.5  --   HGB 9.6* 8.3*  HCT 29.3* 25.2*  MCV 83.0 84.3  PLT 236 208   Cardiac Enzymes: No results for input(s): CKTOTAL, CKMB, CKMBINDEX, TROPONINI in the last 168 hours. BNP (last 3 results)  Recent Labs  04/05/15 1439  BNP 175.9*    ProBNP (last 3 results) No results for input(s): PROBNP in the last 8760 hours.  CBG:  Recent Labs Lab 04/06/15 1136 04/06/15 1707 04/06/15 2144 04/07/15 0730 04/07/15 1221  GLUCAP 155* 123* 181* 119* 139*    Recent Results (from the past 240 hour(s))  Urine culture     Status: None   Collection Time: 04/05/15  3:58 PM  Result Value Ref Range Status   Specimen Description URINE, CATHETERIZED  Final   Special Requests NONE  Final   Culture   Final    >=100,000 COLONIES/mL YEAST Performed at [facility]  Report Status 04/07/2015 FINAL  Final     Studies: No results found.  Scheduled Meds: . apixaban  2.5 mg Oral BID  . carvedilol  12.5 mg Oral BID WC  . cloNIDine  0.1 mg Oral TID  . [START ON 04/08/2015] fluconazole (DIFLUCAN) IV  100 mg Intravenous Q1400  . insulin aspart  0-9 Units Subcutaneous TID WC  . tamsulosin  0.4 mg Oral Daily   Continuous Infusions:    Active Problems:   Acute encephalopathy   Urinary tract infection   Pressure ulcer    Time spent: 15 minutes.     [facility]UL[facility],Elise Gladden  [facility] Pager [phone] . If 7PM-7AM, please contact night-coverage at [email or web address], password  [email or web address] 04/07/2015, 4:35 PM

## 2015-04-07 NOTE — Clinical Social Work Note (Signed)
Clinical Social Work Assessment  Patient Details  Name: Justin HansenDonald Bennison Sr. MRN: [id number] Date of Birth: 04/30/1946  Date of referral:  04/07/15               Reason for consult:  Facility Placement, Discharge Planning                Permission sought to share information with:  Facility Medical sales representativeContact Representative, Family Supports Permission granted to share information::  Yes, Verbal Permission Granted  Name::     [name]  [facility]  Relationship::  Spouse  Contact Information:  [phone]  Housing/Transportation Living arrangements for the past 2 months:  Single Family Home Source of Information:  Spouse Patient Interpreter Needed:  None Criminal Activity/Legal Involvement Pertinent to Current Situation/Hospitalization:  No - Comment as needed Significant Relationships:  Spouse Lives with:  Spouse Do you feel safe going back to the place where you live?  No (Patient's wife agreeable to SNF placement.) Need for family participation in patient care:  Yes (Comment) (Patient's wife active in patient's care.)  Care giving concerns:  Patient's wife expressed no concerns at this time.   Social Worker assessment / plan:  CSW received referral for possible SNF placement at time of discharge. CSW spoke with patient's wife via phone to discuss patient's discharge disposition. Per patient's wife, patient's wife unable to care for patient at home and would prefer for patient to discharge to SNF at time of discharge. Patient's [facility]. CSW to [facility]. Weekday CSW to continue to follow and assist with discharge planning needs.  Employment status:  Retired Health and safety inspectornsurance information:  Teacher, English as a foreign languageManaged Medicare ([employer]imedia programmer[employer] Healthcare Medicare) PT Recommendations:  Skilled Nursing Facility Information / Referral to community resources:  Skilled Nursing Facility  Patient/Family's  Response to care:  Patient's wife understanding and agreeable to CSW plan of care.  Patient/Family's Understanding of and Emotional Response to Diagnosis, Current Treatment, and Prognosis:  Patient's wife understanding and agreeable to CSW plan of care.  Emotional Assessment Appearance:  Appears stated age Attitude/Demeanor/Rapport:  Other (CSW spoke with patient's wife regarding discharge disposition.) Affect (typically observed):  Other (CSW spoke with patient's wife regarding discharge disposition.) Orientation:  Oriented to Self, Oriented to Place, Oriented to  Time, Oriented to Situation (Presented to [facility] with confusion.) Alcohol / Substance use:  Not Applicable Psych involvement (Current and /or in the community):  No (Comment) (Not appropriate on this admission.)  Discharge Needs  Concerns to be addressed:  No discharge needs identified Readmission within the last 30 days:  No Current discharge risk:  None Barriers to Discharge:  No Barriers Identified   Rod MaeVaughn, Lisaann Atha S, LCSW 04/07/2015, 1:54 PM [phone]

## 2015-04-08 DIAGNOSIS — L8990 Pressure ulcer of unspecified site, unspecified stage: Secondary | ICD-10-CM | POA: Diagnosis not present

## 2015-04-08 DIAGNOSIS — N3000 Acute cystitis without hematuria: Secondary | ICD-10-CM | POA: Diagnosis not present

## 2015-04-08 DIAGNOSIS — G9340 Encephalopathy, unspecified: Secondary | ICD-10-CM | POA: Diagnosis not present

## 2015-04-08 LAB — GLUCOSE, CAPILLARY
Glucose-Capillary: 148 mg/dL — ABNORMAL HIGH (ref 65–99)
Glucose-Capillary: 149 mg/dL — ABNORMAL HIGH (ref 65–99)
Glucose-Capillary: 220 mg/dL — ABNORMAL HIGH (ref 65–99)
Glucose-Capillary: 182 mg/dL — ABNORMAL HIGH (ref 65–99)

## 2015-04-08 LAB — BASIC METABOLIC PANEL
ANION GAP: 4 — AB (ref 5–15)
BUN: 46 mg/dL — ABNORMAL HIGH (ref 6–20)
CALCIUM: 7.9 mg/dL — AB (ref 8.9–10.3)
CO2: 19 mmol/L — AB (ref 22–32)
CREATININE: 2.4 mg/dL — AB (ref 0.61–1.24)
Chloride: 118 mmol/L — ABNORMAL HIGH (ref 101–111)
GFR, EST AFRICAN AMERICAN: 30 mL/min — AB (ref >60)
GFR, EST NON AFRICAN AMERICAN: 26 mL/min — AB (ref >60)
Glucose, Bld: 160 mg/dL — ABNORMAL HIGH (ref 65–99)
Potassium: 5 mmol/L (ref 3.5–5.1)
Sodium: 141 mmol/L (ref 135–145)

## 2015-04-08 LAB — SURGICAL PCR SCREEN
MRSA, PCR: NEGATIVE
STAPHYLOCOCCUS AUREUS: POSITIVE — AB

## 2015-04-08 MED ORDER — TRAMADOL HCL 50 MG PO TABS: 50.0000 mg | ORAL_TABLET | Freq: Four times a day (QID) | ORAL | Status: AC | PRN

## 2015-04-08 MED ORDER — CALCIUM CARBONATE 1250 (500 CA) MG PO TABS
1.0000 | ORAL_TABLET | Freq: Two times a day (BID) | ORAL | Status: DC
Start: 2015-04-09 — End: 2015-04-10
  Administered 2015-04-09 (×2): 500 mg via ORAL
  Filled 2015-04-08 (×4): qty 1

## 2015-04-08 MED ORDER — FUROSEMIDE 10 MG/ML IJ SOLN
40.0000 mg | Freq: Every day | INTRAMUSCULAR | Status: DC
  Administered 2015-04-08 – 2015-04-09 (×2): 40 mg via INTRAVENOUS
  Filled 2015-04-08 (×2): qty 4

## 2015-04-08 MED ORDER — ZINC OXIDE 40 % EX OINT
TOPICAL_OINTMENT | Freq: Two times a day (BID) | CUTANEOUS | Status: DC
  Administered 2015-04-08 – 2015-04-09 (×2): 1 via TOPICAL
  Administered 2015-04-09: 11:00:00 via TOPICAL
  Filled 2015-04-08: qty 114

## 2015-04-08 NOTE — Consult Note (Signed)
WOC wound consult note Reason for Consult: pressure injury due to medical device at base of penis (condom catheter), incontinence associated dermatitis Wound type:Pressure, MASD Pressure Ulcer POA: Yes Measurement: Ulcer at base of penis:  1.5cm x 2.4cm with base obscured by the presence of necrotic tissue (unstageable). Scant light yellow exudate. Bilateral buttocks with scattered partial thickness tissue loss due to moisture plus friction and pressure. Wound beds are erd/pink and moist. Scant exudate. Patient is heavy and hard to reposition due to limitations.  He has been at home.   Wound bed:As described above. Drainage (amount, consistency, odor) As described above. Periwound: Intact, dry, edematous Dressing procedure/placement/frequency: I will today provide a therapeutic mattress with low air loss feature and provide a pressure redistribution heel boot for the right foot to prevent pressure ulceration.  Local wound care with timely incontinence care and skin barrier protection (zinc oxide on the ulcer and critic aid clear on the buttocks) are added for skin recovery. Turning and repositioning to avoid the supine position is important as will be the use of minimal bed linens so that patient receives the maximum benefit from his mattress. WOC nursing team will not follow, but will remain available to this patient, the nursing and medical teams.  Please re-consult if needed. Thanks, Ladona MowLaurie Weslyn Holsonback, MSN, RN, GNP, Hans EdenCWOCN, CWON-AP, FAAN  Pager# [phone]

## 2015-04-08 NOTE — Progress Notes (Addendum)
CSW continuing to follow.   CSW followed up with pt wife via telephone to discuss SNF bed offers. [facility]. CSW confirmed with pt wife that pt wife is aware that she will have to revoke hospice benefit in order for pt to go to SNF. Pt expressed understanding, but stated that she did want to have hospice come back into the home once pt discharges from SNF.   CSW contacted [facility] and confirmed facility could accept pt when medically ready for discharge.   CSW contacted [facility] RN liaison and notified that pt will need to revoke hospice benefit today. [facility] RN liaison contacting [facility] SW in order to revoke benefit.   Per MD, pt not yet medically ready for discharge.  CSW to continue to follow to provide support and assist with pt disposition needs.   Addendum 10:19 am:   CSW received [facility] that pt was discharged from hospice services on February 22, 2015. [facility] RN liaison confirmed that pt could be reevaluated by hospice upon discharge from the SNF. [facility] RN liaison planned to contact pt wife to update.   CSW to continue to follow to assist with [facility] discharge [facility] when medically ready for discharge.  Loletta SpecterSuzanna Evalyn Shultis, MSW, LCSW Clinical Social Work [phone]

## 2015-04-08 NOTE — Progress Notes (Signed)
[facility] PROGRESS NOTE  Justin HansenDonald Wohlford Sr. [id number] DOB: 04/02/1946 DOA: 04/05/2015 PCP: Evlyn CourierHILL,Justin K, MD Brief history: Justin Hansenonald Prater Sr. is a 69 y.o. male with prior h/o chronic diastolic heart failure, stage 4 CKD, type 2 DM, hypertension, recently discharged from the hospital, presents today with his wife for confusion. Assessment/Plan: Acute encephalopathy: probably from infection.  - as per the wife he has been like that off and on for a few months now.  Appears to be improving, he is more alert and awake . Appear to be oriented.    UTI: Abnormal UA, frequent urination, urine cultures sent.  Afebrile, no leukocytosis and normal lactic acid.  Started on IV ciprofloxacin. Urine cultures grew yeast. Discontinued the ciprofloxacin and started him on diflucan.  Acute on CKD STAGE 4; Worsened with fluids.  Get US renal, showed no hydronephrosis,  Repeat renal parameters show creatinine of 2.4  Anemia; normocytic, probably from chronic disease.   Chronic diastolic heart failure.  Compensated.   Diabetes mellitus: SSI.  CBG (last 3)   Recent Labs  04/08/15 0716 04/08/15 1221 04/08/15 1656  GLUCAP 148* 149* 220*      Pulmonary embolism : On anticoagulation. Hemoglobin stable.   Pvd; S/p left BKA , stump looks okay. No open wound .   Code Status: DNR Family Communication: NONE at bedside today. Disposition Plan: SNF if renal function improves in am. Plan for palliative care follow up at snf.    Consultants: Hospice Physical therapy.  Procedures: none Antibiotics: IV cipro HPI/Subjective: No new complaints.   Objective: Filed Vitals:   04/08/15 1601  BP: 142/62  Pulse:   Temp:   Resp:     Intake/Output Summary (Last 24 hours) at 04/08/15 1908 Last data filed at 04/08/15 1400  Gross per 24 hour  Intake    290 ml  Output   1450 ml  Net  -1160 ml   Filed Weights   04/05/15 1715  Weight: 102.059 kg (225 lb)     Exam:   General:  Alert afebrile comfortable  Cardiovascular: s1s2  Respiratory: ctab  Abdomen: soft NT nd bs+  Musculoskeletal: left BKA. Pedal edema present.   Skin:  Stage 1 pressure ulcer in the sacral area, ulcer under the penial area  Data Reviewed: Basic Metabolic Panel:  Recent Labs Lab 04/05/15 1438 04/07/15 1503 04/08/15 1030  NA 143 141 141  K 4.7 4.8 5.0  CL 119* 118* 118*  CO2 20* 18* 19*  GLUCOSE 171* 155* 160*  BUN 33* 42* 46*  CREATININE 2.01* 2.43* 2.40*  CALCIUM 8.0* 7.7* 7.9*   Liver Function Tests:  Recent Labs Lab 04/05/15 1438  AST 13*  ALT 8*  ALKPHOS 93  BILITOT 0.4  PROT 6.3*  ALBUMIN 2.6*   No results for input(s): LIPASE, AMYLASE in the last 168 hours. No results for input(s): AMMONIA in the last 168 hours. CBC:  Recent Labs Lab 04/05/15 1438 04/07/15 1503  WBC 6.9 7.3  NEUTROABS 4.5  --   HGB 9.6* 8.3*  HCT 29.3* 25.2*  MCV 83.0 84.3  PLT 236 208   Cardiac Enzymes: No results for input(s): CKTOTAL, CKMB, CKMBINDEX, TROPONINI in the last 168 hours. BNP (last 3 results)  Recent Labs  04/05/15 1439  BNP 175.9*    ProBNP (last 3 results) No results for input(s): PROBNP in the last 8760 hours.  CBG:  Recent Labs Lab 04/07/15 1638 04/07/15 2128 04/08/15 0716 04/08/15 1221 04/08/15 1656  GLUCAP 156* 168* 148* 149*  220*    Recent Results (from the past 240 hour(s))  Urine culture     Status: None   Collection Time: 04/05/15  3:58 PM  Result Value Ref Range Status   Specimen Description URINE, CATHETERIZED  Final   Special Requests NONE  Final   Culture   Final    >=100,000 COLONIES/mL YEAST Performed at [facility]    Report Status 04/07/2015 FINAL  Final  Surgical pcr screen     Status: Abnormal   Collection Time: 04/08/15  9:42 AM  Result Value Ref Range Status   MRSA, PCR NEGATIVE NEGATIVE Final   Staphylococcus aureus POSITIVE (A) NEGATIVE Final    Comment:        The Xpert SA  Assay (FDA approved for NASAL specimens in patients over 60 years of age), is one component of a comprehensive surveillance program.  Test performance has been validated by [facility] for patients greater than or equal to 27 year old. It is not intended to diagnose infection nor to guide or monitor treatment.      Studies: US Renal  04/08/2015  CLINICAL DATA:  Acute renal failure.  Urinary tract infection. EXAM: RENAL / URINARY TRACT ULTRASOUND COMPLETE COMPARISON:  07/16/2013 retroperitoneal sonogram . FINDINGS: Right Kidney: Length: 12.4 cm. Echogenicity within normal limits. No mass or hydronephrosis visualized. Left Kidney: Length: 12.4 cm. Echogenicity within normal limits. No mass or hydronephrosis visualized. Bladder: There is prominent layering debris in the bladder lumen. No shadowing bladder stones. Mild diffuse bladder wall thickening. The partially visualized prostate appears enlarged. IMPRESSION: 1. Normal kidneys, with no hydronephrosis. 2. Prominent layering debris in the bladder lumen. No shadowing bladder stones. Mild diffuse bladder wall thickening, in keeping with the provided history of urinary tract infection. 3. Suggestion of prostatomegaly. Electronically Signed   By: Delbert Phenix M.D.   On: 04/08/2015 06:53    Scheduled Meds: . apixaban  2.5 mg Oral BID  . [START ON 04/09/2015] calcium carbonate  1 tablet Oral BID WC  . carvedilol  12.5 mg Oral BID WC  . cloNIDine  0.1 mg Oral TID  . fluconazole (DIFLUCAN) IV  100 mg Intravenous Q1400  . furosemide  40 mg Intravenous Daily  . insulin aspart  0-9 Units Subcutaneous TID WC  . liver oil-zinc oxide   Topical BID  . tamsulosin  0.4 mg Oral Daily   Continuous Infusions:    Active Problems:   Acute encephalopathy   Urinary tract infection   Pressure ulcer    Time spent: 15 minutes.     [facility]  [facility] Pager [phone] . If 7PM-7AM, please contact night-coverage at [email or web address], password  [facility] 04/08/2015, 7:08 PM

## 2015-04-08 NOTE — Clinical Social Work Placement (Signed)
   CLINICAL SOCIAL WORK PLACEMENT  NOTE  Date:  04/08/2015  Patient Details  Name: Justin HansenDonald [name] MRN: [id number] Date of Birth: 03/24/1946  Clinical Social Work is seeking post-discharge placement for this patient at the Skilled  Nursing Facility level of care (*CSW will initial, date and re-position this form in  chart as items are completed):  Yes   Patient/family provided with [facility] Clinical Social Work Department's list of facilities offering this level of care within the geographic area requested by the patient (or if unable, by the patient's family).  Yes   Patient/family informed of their freedom to choose among providers that offer the needed level of care, that participate in Medicare, Medicaid or managed care program needed by the patient, have an available bed and are willing to accept the patient.  Yes   Patient/family informed of [facility]'s ownership interest in [facility] and [facility], as well as of the fact that they are under no obligation to receive care at these facilities.  PASRR submitted to EDS on       PASRR number received on       Existing PASRR number confirmed on 04/07/15     FL2 transmitted to all facilities in geographic area requested by pt/family on 04/07/15     FL2 transmitted to all facilities within larger geographic area on       Patient informed that his/her managed care company has contracts with or will negotiate with certain facilities, including the following:        Yes   Patient/family informed of bed offers received.  [facility]     Physician recommends and patient chooses bed at      Patient to be [facility] on  .  Patient to be transferred to facility by       Patient family notified on   of transfer.  Name of family member notified:        PHYSICIAN       Additional Comment:     _______________________________________________ Orson EvaKIDD, Jekhi Bolin A, LCSW 04/08/2015, 10:12 AM

## 2015-04-09 DIAGNOSIS — K219 Gastro-esophageal reflux disease without esophagitis: Secondary | ICD-10-CM | POA: Diagnosis not present

## 2015-04-09 DIAGNOSIS — N390 Urinary tract infection, site not specified: Secondary | ICD-10-CM | POA: Diagnosis not present

## 2015-04-09 DIAGNOSIS — I5032 Chronic diastolic (congestive) heart failure: Secondary | ICD-10-CM | POA: Diagnosis not present

## 2015-04-09 DIAGNOSIS — E1122 Type 2 diabetes mellitus with diabetic chronic kidney disease: Secondary | ICD-10-CM | POA: Diagnosis not present

## 2015-04-09 DIAGNOSIS — I10 Essential (primary) hypertension: Secondary | ICD-10-CM | POA: Diagnosis not present

## 2015-04-09 DIAGNOSIS — E119 Type 2 diabetes mellitus without complications: Secondary | ICD-10-CM | POA: Diagnosis not present

## 2015-04-09 DIAGNOSIS — N184 Chronic kidney disease, stage 4 (severe): Secondary | ICD-10-CM | POA: Diagnosis not present

## 2015-04-09 DIAGNOSIS — Z79899 Other long term (current) drug therapy: Secondary | ICD-10-CM | POA: Diagnosis not present

## 2015-04-09 DIAGNOSIS — E1165 Type 2 diabetes mellitus with hyperglycemia: Secondary | ICD-10-CM | POA: Diagnosis not present

## 2015-04-09 DIAGNOSIS — Z882 Allergy status to sulfonamides status: Secondary | ICD-10-CM | POA: Diagnosis not present

## 2015-04-09 DIAGNOSIS — M2550 Pain in unspecified joint: Secondary | ICD-10-CM | POA: Diagnosis not present

## 2015-04-09 DIAGNOSIS — Z89512 Acquired absence of left leg below knee: Secondary | ICD-10-CM | POA: Diagnosis not present

## 2015-04-09 DIAGNOSIS — E785 Hyperlipidemia, unspecified: Secondary | ICD-10-CM | POA: Diagnosis not present

## 2015-04-09 DIAGNOSIS — Z87891 Personal history of nicotine dependence: Secondary | ICD-10-CM | POA: Diagnosis not present

## 2015-04-09 DIAGNOSIS — Z7901 Long term (current) use of anticoagulants: Secondary | ICD-10-CM | POA: Diagnosis not present

## 2015-04-09 DIAGNOSIS — I739 Peripheral vascular disease, unspecified: Secondary | ICD-10-CM | POA: Diagnosis not present

## 2015-04-09 DIAGNOSIS — D631 Anemia in chronic kidney disease: Secondary | ICD-10-CM | POA: Diagnosis not present

## 2015-04-09 DIAGNOSIS — N179 Acute kidney failure, unspecified: Secondary | ICD-10-CM | POA: Diagnosis not present

## 2015-04-09 DIAGNOSIS — N3000 Acute cystitis without hematuria: Secondary | ICD-10-CM | POA: Diagnosis not present

## 2015-04-09 DIAGNOSIS — G9340 Encephalopathy, unspecified: Secondary | ICD-10-CM | POA: Diagnosis not present

## 2015-04-09 DIAGNOSIS — I130 Hypertensive heart and chronic kidney disease with heart failure and stage 1 through stage 4 chronic kidney disease, or unspecified chronic kidney disease: Secondary | ICD-10-CM | POA: Diagnosis not present

## 2015-04-09 DIAGNOSIS — L89151 Pressure ulcer of sacral region, stage 1: Secondary | ICD-10-CM | POA: Diagnosis not present

## 2015-04-09 DIAGNOSIS — L89890 Pressure ulcer of other site, unstageable: Secondary | ICD-10-CM | POA: Diagnosis not present

## 2015-04-09 DIAGNOSIS — Z87440 Personal history of urinary (tract) infections: Secondary | ICD-10-CM | POA: Diagnosis not present

## 2015-04-09 DIAGNOSIS — E1151 Type 2 diabetes mellitus with diabetic peripheral angiopathy without gangrene: Secondary | ICD-10-CM | POA: Diagnosis not present

## 2015-04-09 DIAGNOSIS — Z66 Do not resuscitate: Secondary | ICD-10-CM | POA: Diagnosis not present

## 2015-04-09 LAB — BASIC METABOLIC PANEL
ANION GAP: 5 (ref 5–15)
BUN: 47 mg/dL — ABNORMAL HIGH (ref 6–20)
CALCIUM: 8.1 mg/dL — AB (ref 8.9–10.3)
CO2: 19 mmol/L — ABNORMAL LOW (ref 22–32)
Chloride: 117 mmol/L — ABNORMAL HIGH (ref 101–111)
Creatinine, Ser: 2.09 mg/dL — ABNORMAL HIGH (ref 0.61–1.24)
GFR, EST AFRICAN AMERICAN: 35 mL/min — AB (ref >60)
GFR, EST NON AFRICAN AMERICAN: 30 mL/min — AB (ref >60)
Glucose, Bld: 186 mg/dL — ABNORMAL HIGH (ref 65–99)
Potassium: 4.8 mmol/L (ref 3.5–5.1)
SODIUM: 141 mmol/L (ref 135–145)

## 2015-04-09 LAB — GLUCOSE, CAPILLARY
GLUCOSE-CAPILLARY: 126 mg/dL — AB (ref 65–99)
GLUCOSE-CAPILLARY: 162 mg/dL — AB (ref 65–99)
Glucose-Capillary: 133 mg/dL — ABNORMAL HIGH (ref 65–99)

## 2015-04-09 MED ORDER — ZINC OXIDE 40 % EX OINT: TOPICAL_OINTMENT | Freq: Two times a day (BID) | CUTANEOUS | Status: AC

## 2015-04-09 NOTE — Progress Notes (Signed)
Pt for discharge to [facility].   CSW faxed PT note from today as facility stated [facility] Medicare was requesting PT note and received confirmation from [facility] that [facility] Medicare approved admission to SNF.  CSW met with pt and pt wife at bedside to update and clarify questions. CSW explained that it will be important for pt to participate with PT at SNF as facility will continue to have to send clinical information to [facility] Medicare for continued approval for SNF. Pt expressed understanding. Pt wife stated that goal is for pt to return home and pt wife wants hospice to be re-consulted when pt medically ready to leave SNF.   CSW facilitated pt discharge needs including contacting facility, faxing pt discharge information to SNF, providing RN phone number to call report, discussing and providing support to pt and pt wife at bedside, and arranging ambulance transport for pt to  [facility].   No further social work needs identified at this time.  CSW signing off.   Alison Murray, MSW, [place] [facility] [phone]

## 2015-04-09 NOTE — Plan of Care (Signed)
Problem: Phase II Progression Outcomes Goal: Progress activity as tolerated unless otherwise ordered Outcome: Not Applicable Date Met:  51/89/84 Turned every 2-3 hours. Pt refuses at times.

## 2015-04-09 NOTE — Progress Notes (Signed)
Physical Therapy Treatment Patient Details Name: Justin Inge Sr. MRN: [id number] DOB: [birth year] Today's Date: 04/09/2015    History of Present Illness [name] is an 69 y.o. male with complex medical history including DM, HTN, HLD, CHF, CKD IV, PVD who presents to the ED via EMS for evaluation of weakness, confusion, urinary problems, and confusion.     PT Comments    Pt cooperative with session today, wife present as well just a very flat affect during session. Pt is very stiff, c/o no pain however. Worked with AAROM for BLES and rolling, and sitting EOB. Worked on hip flexion and knee flexion. Pt leans to right in bed and in sitting and worked on lateral leans to help correct this. Alos left patient on position propped on left side to help facilitate weight bearing on that side as well. Also replaced PRAFO back on R foot  (noted redness and shiny skin tone on lower leg).   Follow Up Recommendations  SNF     Equipment Recommendations       Recommendations for Other Services       Precautions / Restrictions Precautions Precautions: Fall    Mobility  Bed Mobility Overal bed mobility: +2 for physical assistance Bed Mobility: Rolling Rolling: Max assist;+2 for physical assistance         General bed mobility comments: very stiff in arms and legs, had to give tacile, and assist for cues and movement.  Transfers                    Ambulation/Gait                 Stairs            Wheelchair Mobility    Modified Rankin (Stroke Patients Only)       Balance Overall balance assessment: Needs assistance Sitting-balance support: Bilateral upper extremity supported;Feet unsupported (due to L BKA and R very stiff to get full support on floor) Sitting balance-Leahy Scale: Zero Sitting balance - Comments: pt very stiff with ability to have hip flexion. pt with posterior lean and slightly right. Corrected after exercise with Left lateral lean and  then righting. Sat for 3 minutes today before fatigue. Improved from last session.                             Cognition Arousal/Alertness: Awake/alert Behavior During Therapy: Flat affect (very flat affect.) Overall Cognitive Status: Difficult to assess                      Exercises      General Comments        Pertinent Vitals/Pain      Home Living                      Prior Function            PT Goals (current goals can now be found in the care plan section) Acute Rehab PT Goals Patient Stated Goal: Pt's wife wants patient to be able to assist with rolling, sitting EOB and would like pt to be able to transfer to a WC . Pt wishes to walk again.  PT Goal Formulation: With patient/family Time For Goal Achievement: 04/20/15 Potential to Achieve Goals: Fair Progress towards PT goals: Progressing toward goals    Frequency  Min 3X/week    PT Plan Current plan remains  appropriate    Co-evaluation             End of Session   Activity Tolerance: Patient tolerated treatment well Patient left: in bed;with family/visitor present;with call bell/phone within reach     Time: [id number] PT Time Calculation (min) (ACUTE ONLY): 24 min  Charges:  $Therapeutic Exercise: 8-22 mins $Therapeutic Activity: 8-22 mins                    G CodesMarella Bile:      Alysah Carton 04/09/2015, 4:03 PM  Marella BileSharron Nikko Quast, PT Pager: [phone] 04/09/2015

## 2015-04-09 NOTE — Discharge Summary (Signed)
Physician Discharge Summary  Justin Pann Sr. HYQ:[id number] DOB: [birth year] DOA: 04/05/2015  PCP: [name], MD  Admit date: 04/05/2015 Discharge date: 04/09/2015  Time spent: 25 minutes  Recommendations for Outpatient Follow-up:  1. Please follow upw ith palliative care services at SNF and possibly hospice.  2. Please follow up with PCP in 1 to 2 weeks and check BMP to check renal parameters.   Discharge Diagnoses:  Active Problems:   Acute encephalopathy   Urinary tract infection   Pressure ulcer   Discharge Condition: improved  Diet recommendation: low sodium diet.   Filed Weights   04/05/15 1715  Weight: 102.059 kg (225 lb)    History of present illness:  Justin Mcmahill Sr. is a 69 y.o. male with prior h/o chronic diastolic heart failure, stage 4 CKD, type 2 DM, hypertension, recently discharged from the hospital, presents today with his wife for confusion.  Hospital Course:  * Acute encephalopathy: unclear etiology, probably thought to be secondary to infection vs sec to worsening of cognitive impairment.  - as per the wife he has been like that off and on for a few months now.  Appears to be improving, he is more alert and awake . Appear to be oriented.  No agitation.    UTI: Abnormal UA, frequent urination, urine cultures sent.  Afebrile, no leukocytosis and normal lactic acid.  Started on IV ciprofloxacin. Urine cultures grew yeast. Discontinued the ciprofloxacin and started him on diflucan. Received a dose of diflucan.    Acute on CKD STAGE 4; Worsened with fluids after admission.  Get US renal, showed no hydronephrosis,  Repeat renal parameters show creatinine of 2.4 which has improved to 2 on discharge.   Anemia; normocytic, probably from chronic disease.   Chronic diastolic heart failure.  Compensated.   Skin: Stage 1 pressure ulcer , wound care consulted and recommendations given.   Diabetes mellitus: SSI.  CBG (last 3)   Recent  Labs (last 2 labs)      Recent Labs  04/08/15 0716 04/08/15 1221 04/08/15 1656  GLUCAP 148* 149* 220*        Pulmonary embolism : On anticoagulation. Hemoglobin stable.   Pvd; S/p left BKA , stump looks okay. No open wound        Procedures:  none  Consultations:  Physical therapy.  Discharge Exam: Filed Vitals:   04/09/15 0900  BP: 134/47  Pulse: 69  Temp: 97.7 F (36.5 C)  Resp: 18    General: alert afebrile no new complaints.  Cardiovascular: s1s2 Respiratory: ctab  Discharge Instructions   Discharge Instructions    Diet - low sodium heart healthy    Complete by:  As directed      Discharge instructions    Complete by:  As directed   Follow up with PCP in one week.  Please follow up with hospice as recommended.  Please [facility] at [facility]  Please get BMP in two days to check renal function.          Current Discharge Medication List    START taking these medications   Details  liver oil-zinc oxide (DESITIN) 40 % ointment Apply topically 2 (two) times daily at 10 am and 4 pm. Qty: 56.7 g, Refills: 0    traMADol (ULTRAM) 50 MG tablet Take 1 tablet (50 mg total) by mouth every 6 (six) hours as needed for moderate pain. Qty: 20 tablet, Refills: 0      CONTINUE these medications which  have NOT CHANGED   Details  amLODipine (NORVASC) 5 MG tablet Take 5 mg by mouth daily.    apixaban (ELIQUIS) 2.5 MG TABS tablet Take 2.5 mg by mouth 2 (two) times daily.    carvedilol (COREG) 12.5 MG tablet Take 1 tablet (12.5 mg total) by mouth 2 (two) times daily with a meal. Qty: 60 tablet, Refills: 3    cloNIDine (CATAPRES) 0.1 MG tablet Take 0.1 mg by mouth 3 (three) times daily.     furosemide (LASIX) 40 MG tablet Take 40 mg by mouth daily.    hydrOXYzine (ATARAX/VISTARIL) 10 MG tablet Take 10 mg by mouth 3 (three) times daily as needed for itching.    insulin aspart (NOVOLOG) 100 UNIT/ML injection Inject into the skin 3  (three) times daily with meals. SSI 200-300   7 UNITS; 301-400  9 UNITS; 401-500   12  UNITS; > 500 14  UNITS AND CALL MD   CBG AC AND QHS    ondansetron (ZOFRAN) 4 MG tablet Take 1 tablet (4 mg total) by mouth every 6 (six) hours as needed for nausea. Qty: 20 tablet, Refills: 0    potassium chloride (K-DUR) 10 MEQ tablet Take 10 mEq by mouth 2 (two) times daily.    Tamsulosin HCl (FLOMAX) 0.4 MG CAPS Take 1 capsule (0.4 mg total) by mouth daily. Qty: 30 capsule, Refills: 3       Allergies  Allergen Reactions  . Ancef [Cefazolin] Itching and Rash  . Sulfa Antibiotics Itching and Rash   Follow-up Information    Call Information on doctors that make home visits.   Why:  Doctors making house calls [address], [place], [place] [address] [phone], As needed   Contact information:   [facility] [address], [address] [place], [place] [address] Dr Florentina JennyHenry Tripp --[phone] fax [phone] to [facility]  [facility] [address] [place] [address] [phone]       Follow up with Evlyn CourierHILL,[name], MD. Schedule an appointment as soon as possible for a visit in 1 week.   Specialty:  Family Medicine   Contact information:   [address] [place] [address] [phone]        The results of significant diagnostics from this hospitalization (including imaging, microbiology, ancillary and laboratory) are listed below for reference.    Significant Diagnostic Studies: [place] Renal  04/08/2015  CLINICAL DATA:  Acute renal failure.  Urinary tract infection. EXAM: RENAL / URINARY TRACT ULTRASOUND COMPLETE COMPARISON:  07/16/2013 retroperitoneal sonogram . FINDINGS: Right Kidney: Length: 12.4 cm. Echogenicity within normal limits. No mass or hydronephrosis visualized. Left Kidney: Length: 12.4 cm. Echogenicity within normal limits. No mass or hydronephrosis visualized. Bladder: There is prominent layering debris in the  bladder lumen. No shadowing bladder stones. Mild diffuse bladder wall thickening. The partially visualized prostate appears enlarged. IMPRESSION: 1. Normal kidneys, with no hydronephrosis. 2. Prominent layering debris in the bladder lumen. No shadowing bladder stones. Mild diffuse bladder wall thickening, in keeping with the provided history of urinary tract infection. 3. Suggestion of prostatomegaly. Electronically Signed   By: Delbert PhenixJason A Poff M.D.   On: 04/08/2015 06:53   Dg Chest Portable 1 View  04/05/2015  CLINICAL DATA:  Milky and foul-smelling urine. Weakness. Coffee ground stools. Chronic cough. EXAM: PORTABLE CHEST 1 VIEW COMPARISON:  03/26/2014 FINDINGS: Mild elevation of the right hemidiaphragm is unchanged. Lungs are clear  without airspace disease or pulmonary edema. Heart and mediastinum are within normal limits. Negative for a pneumothorax. No acute bone abnormality. IMPRESSION: No acute chest findings. Electronically Signed   By: Richarda Overlie M.D.   On: 04/05/2015 15:11    Microbiology: Recent Results (from the past 240 hour(s))  Urine culture     Status: None   Collection Time: 04/05/15  3:58 PM  Result Value Ref Range Status   Specimen Description URINE, CATHETERIZED  Final   Special Requests NONE  Final   Culture   Final    >=100,000 COLONIES/mL YEAST Performed at [facility]    Report Status 04/07/2015 FINAL  Final  Surgical pcr screen     Status: Abnormal   Collection Time: 04/08/15  9:42 AM  Result Value Ref Range Status   MRSA, PCR NEGATIVE NEGATIVE Final   Staphylococcus aureus POSITIVE (A) NEGATIVE Final    Comment:        The Xpert SA Assay (FDA approved for NASAL specimens in patients over 67 years of age), is one component of a comprehensive surveillance program.  Test performance has been validated by [facility] for patients greater than or equal to 67 year old. It is not intended to diagnose infection nor to guide or monitor treatment.       Labs: Basic Metabolic Panel:  Recent Labs Lab 04/05/15 1438 04/07/15 1503 04/08/15 1030 04/09/15 0930  NA 143 141 141 141  K 4.7 4.8 5.0 4.8  CL 119* 118* 118* 117*  CO2 20* 18* 19* 19*  GLUCOSE 171* 155* 160* 186*  BUN 33* 42* 46* 47*  CREATININE 2.01* 2.43* 2.40* 2.09*  CALCIUM 8.0* 7.7* 7.9* 8.1*   Liver Function Tests:  Recent Labs Lab 04/05/15 1438  AST 13*  ALT 8*  ALKPHOS 93  BILITOT 0.4  PROT 6.3*  ALBUMIN 2.6*   No results for input(s): LIPASE, AMYLASE in the last 168 hours. No results for input(s): AMMONIA in the last 168 hours. CBC:  Recent Labs Lab 04/05/15 1438 04/07/15 1503  WBC 6.9 7.3  NEUTROABS 4.5  --   HGB 9.6* 8.3*  HCT 29.3* 25.2*  MCV 83.0 84.3  PLT 236 208   Cardiac Enzymes: No results for input(s): CKTOTAL, CKMB, CKMBINDEX, TROPONINI in the last 168 hours. BNP: BNP (last 3 results)  Recent Labs  04/05/15 1439  BNP 175.9*    ProBNP (last 3 results) No results for input(s): PROBNP in the last 8760 hours.  CBG:  Recent Labs Lab 04/08/15 0716 04/08/15 1221 04/08/15 1656 04/08/15 2151 04/09/15 0727  GLUCAP 148* 149* 220* 182* 126*       Signed:  Eldridge Marcott  [facility] 04/09/2015, 10:36 AM

## 2015-04-09 NOTE — Progress Notes (Signed)
Report called to [facility] and given to Gwynneth MacleodPamela Davis, LPN regarding pt status , VSS, recent meds and reason for transfer to facility. Awaiting PTAR to arrive for transport to [facility].

## 2015-04-09 NOTE — Progress Notes (Signed)
Patient was discharged with PTAR at 2130 to [facility]  Vitals taken ans was stable.  Patients wife was called and informed that he was leaving the hospital.  Marcia BrashSophia Donyel Castagnola, RN.  04/09/15.  2141.

## 2015-04-09 NOTE — Clinical Social Work Placement (Signed)
   CLINICAL SOCIAL WORK PLACEMENT  NOTE  Date:  04/09/2015  Patient Details  Name: Justin HansenDonald Landenberger Sr. MRN: [id number] Date of Birth: 03/26/1946  Clinical Social Work is seeking post-discharge placement for this patient at the Skilled  Nursing Facility level of care (*CSW will initial, date and re-position this form in  chart as items are completed):  Yes   Patient/family provided with [facility] Clinical Social Work Department's list of facilities offering this level of care within the geographic area requested by the patient (or if unable, by the patient's family).  Yes   Patient/family informed of their freedom to choose among providers that offer the needed level of care, that participate in Medicare, Medicaid or managed care program needed by the patient, have an available bed and are willing to accept the patient.  Yes   Patient/family informed of [facility]'s ownership interest in [facility], as well as of the fact that they are under no obligation to receive care at these facilities.  PASRR submitted to EDS on       PASRR number received on       Existing PASRR number confirmed on 04/07/15     FL2 transmitted to all facilities in geographic area requested by pt/family on 04/07/15     FL2 transmitted to all facilities within larger geographic area on       Patient informed that his/her managed care company has contracts with or will negotiate with certain facilities, including the following:        Yes   Patient/family informed of bed offers received.  [facility]     Physician recommends and patient chooses bed at      Patient [facility] be [facility] on 04/09/15.  Patient to be transferred to facility by ambulance Sharin Mons(PTAR)     Patient family notified on 04/09/15 of transfer.  Name of family member notified:  pt and pt wife, [name] notified at bedside     PHYSICIAN        Additional Comment:    _______________________________________________ Orson EvaKIDD, Leslie Jester A, LCSW 04/09/2015, 5:36 PM

## 2015-04-16 ENCOUNTER — Non-Acute Institutional Stay (SKILLED_NURSING_FACILITY): Payer: Medicare Other | Admitting: Internal Medicine

## 2015-04-16 DIAGNOSIS — I10 Essential (primary) hypertension: Secondary | ICD-10-CM

## 2015-04-16 DIAGNOSIS — G9340 Encephalopathy, unspecified: Secondary | ICD-10-CM | POA: Diagnosis not present

## 2015-04-16 DIAGNOSIS — E1151 Type 2 diabetes mellitus with diabetic peripheral angiopathy without gangrene: Secondary | ICD-10-CM

## 2015-04-16 DIAGNOSIS — N184 Chronic kidney disease, stage 4 (severe): Secondary | ICD-10-CM | POA: Diagnosis not present

## 2015-04-16 DIAGNOSIS — I5032 Chronic diastolic (congestive) heart failure: Secondary | ICD-10-CM | POA: Diagnosis not present

## 2015-04-16 DIAGNOSIS — M2550 Pain in unspecified joint: Secondary | ICD-10-CM

## 2015-04-16 DIAGNOSIS — E1165 Type 2 diabetes mellitus with hyperglycemia: Secondary | ICD-10-CM

## 2015-04-16 DIAGNOSIS — D638 Anemia in other chronic diseases classified elsewhere: Secondary | ICD-10-CM | POA: Diagnosis not present

## 2015-04-16 DIAGNOSIS — Z89512 Acquired absence of left leg below knee: Secondary | ICD-10-CM | POA: Diagnosis not present

## 2015-04-16 DIAGNOSIS — IMO0002 Reserved for concepts with insufficient information to code with codable children: Secondary | ICD-10-CM

## 2015-04-17 ENCOUNTER — Encounter: Payer: Self-pay | Admitting: Internal Medicine

## 2015-04-17 NOTE — Progress Notes (Signed)
Patient ID: Justin Leser Sr., male   DOB: 01-03-1946, 69 y.o.   MRN: [id number]    HISTORY AND PHYSICAL   DATE: 04/16/15  Location:  [facility]    Place of Service: SNF [phone])   Extended Emergency Contact Information Primary Emergency Contact: Valentine,[name] Address: [address]          [place], [place] [address] [place] Phone: [phone] Relation: Spouse  Advanced Directive information    Chief Complaint  Patient presents with  . New Admit To SNF    HPI:  69 yo male seen today as a new admission into SNF following hospital stay for encephalopathy and UTI. He was given IV cipro for UTI. Urine cx grew yeast  And cipro stopped --> diflucan started. Cr 2.4-->2 at d/c. Wound care consulted for stage 1 pressure ulcer. Hgb 8.3 at d/c. He presents to SNF for short term rehab.  He c/o b/l shoulder pain but declines pain medications. No falls. No nursing issues. Tolerating PT.  PAD - s/p left BKA. Takes eliquis  Hyperlipidemia -diet controlled  Hx CHF/HTN - BP stable on amlodipine/clonidine/coreg. Swelling controlled on lasix with kcl   DM - CBG 218 . Takes novolog qAC and qHs  Joint pain - stable on tramadol  Urinary incontinence - stable on flomax   Past Medical History  Diagnosis Date  . Diabetes ([place])   . Hypertension   . CHF (congestive heart failure) ([place])   . Hyperlipemia   . Chronic kidney disease     Stage IV  . Pulmonary embolism [facility])     Past Surgical History  Procedure Laterality Date  . Right leg fracture    . Skin graft    . Knee surgery    . Tee without cardioversion  03/17/2012    Procedure: TRANSESOPHAGEAL ECHOCARDIOGRAM (TEE);  Surgeon: Birdie Riddle, MD;  Location: [place];  Service: Cardiovascular;  Laterality: N/A;  . Incision and drainage Left 07/02/2013    Procedure: INCISION AND DRAINAGE LEFT FOOT;  Surgeon: Mcarthur Rossetti, MD;  Location: [facility];  Service: Orthopedics;   Laterality: Left;  . Amputation Left 07/18/2013    Procedure: AMPUTATION BELOW KNEE;  Surgeon: Mcarthur Rossetti, MD;  Location: [place];  Service: Orthopedics;  Laterality: Left;  . Amputation Right 05/19/2015    Procedure: 3rd-5th RAY AMPUTATION RIGHT FOOT;  Surgeon: Mcarthur Rossetti, MD;  Location: [facility];  Service: Orthopedics;  Laterality: Right;    Patient Care Team: Iona Beard, MD as PCP - General (Family Medicine)  Social History   Social History  . Marital Status: Married    Spouse Name: N/A  . Number of Children: N/A  . Years of Education: N/A   Occupational History  . Not on file.   Social History Main Topics  . Smoking status: Former Smoker    Quit date: 03/10/1984  . Smokeless tobacco: Never Used  . Alcohol Use: No  . Drug Use: No  . Sexual Activity: Yes   Other Topics Concern  . Not on file   Social History Narrative   ** Merged History Encounter **         reports that he quit smoking about 31 years ago. He has never used smokeless tobacco. He reports that he does not drink alcohol or use illicit drugs.  Family History  Problem Relation Age of Onset  . Heart attack Father   . Heart attack Mother   . Renal  Disease Sister     one of his sisters needed hemodialysis  . Diabetes Brother    No family status information on file.    Immunization History  Administered Date(s) Administered  . Influenza Split 02/21/2012, 02/15/2013  . Pneumococcal Polysaccharide-23 02/21/2012    Allergies  Allergen Reactions  . Ancef [Cefazolin] Itching and Rash  . Sulfa Antibiotics Itching and Rash    Medications: Patient's Medications  New Prescriptions  Previous Medications   AMLODIPINE (NORVASC) 5 MG TABLET    Take 5 mg by mouth daily.   APIXABAN (ELIQUIS) 2.5 MG TABS TABLET    Take 2.5 mg by mouth 2 (two) times daily.   BISACODYL (BISACODYL) 5 MG EC TABLET    Take 5 mg by mouth daily as needed for mild constipation or moderate constipation.    CARVEDILOL (COREG) 12.5 MG TABLET    Take 1 tablet (12.5 mg total) by mouth 2 (two) times daily with a meal.   CLONIDINE (CATAPRES) 0.1 MG TABLET    Take 0.1 mg by mouth 3 (three) times daily.    HYDROXYZINE (ATARAX/VISTARIL) 10 MG TABLET    Take 10 mg by mouth 3 (three) times daily as needed for itching.   INSULIN ASPART (NOVOLOG) 100 UNIT/ML INJECTION    Inject into the skin 3 (three) times daily with meals. SSI 200-300   7 UNITS; 301-400  9 UNITS; 401-500   12  UNITS; > 500 14  UNITS AND CALL MD   CBG AC AND QHS   LIVER OIL-ZINC OXIDE (DESITIN) 40 % OINTMENT    Apply topically 2 (two) times daily at 10 am and 4 pm.   TAMSULOSIN HCL (FLOMAX) 0.4 MG CAPS    Take 1 capsule (0.4 mg total) by mouth daily.   TRAMADOL (ULTRAM) 50 MG TABLET    Take 1 tablet (50 mg total) by mouth every 6 (six) hours as needed for moderate pain.  Modified Medications   Modified Medication Previous Medication   FUROSEMIDE (LASIX) 40 MG TABLET furosemide (LASIX) 40 MG tablet      Take 1-2 tablets (40-80 mg total) by mouth 2 (two) times daily. $RemoveBefo'80mg'fkiYOZHMEEH$  in am and $Remo'40mg'QHtNJ$  in pm    Take 1-2 tablets (40-80 mg total) by mouth 2 (two) times daily. $RemoveBefo'80mg'pBaHDMIwzHL$  in am and $Remo'40mg'DzcAj$  in pm   HYDROCODONE-ACETAMINOPHEN (NORCO/VICODIN) 5-325 MG TABLET HYDROcodone-acetaminophen (NORCO/VICODIN) 5-325 MG tablet      Take 1 tablet by mouth every 6 (six) hours as needed for moderate pain.    Take 1 tablet by mouth every 6 (six) hours as needed for moderate pain.   POTASSIUM CHLORIDE 20 MEQ TBCR potassium chloride (K-DUR) 10 MEQ tablet      Take 20 mEq by mouth 2 (two) times daily.    Take 10 mEq by mouth 2 (two) times daily.  Discontinued Medications   No medications on file    Review of Systems  Unable to perform ROS: Other  encephalopathy  Filed Vitals:   04/16/15 1143  BP: 152/68  Pulse: 72  Temp: 98.6 F (37 C)  Weight: 225 lb (102.059 kg)   Body mass index is 30.51 kg/(m^2).  Physical Exam  Constitutional: He appears well-developed and  well-nourished.  Lying in bed in NAD  HENT:  Mouth/Throat: Oropharynx is clear and moist.  Eyes: Pupils are equal, round, and reactive to light. No scleral icterus.  Neck: Neck supple. Carotid bruit is not present.  Cardiovascular: Normal rate, regular rhythm and intact distal pulses.  Exam  reveals no gallop and no friction rub.   Murmur (1/6 SEM) heard. no distal LE swelling. No calf TTP  Pulmonary/Chest: Effort normal. He has decreased breath sounds (right base). He has no wheezes. He has no rales. He exhibits no tenderness.  Abdominal: Soft. Bowel sounds are normal. He exhibits no distension, no abdominal bruit, no pulsatile midline mass and no mass. There is no tenderness. There is no rebound and no guarding.  Musculoskeletal: He exhibits edema and tenderness.  Left BKA; small and large joint deformities  Lymphadenopathy:    He has no cervical adenopathy.  Neurological: He is alert.  Skin: Skin is warm and dry. No rash noted.  Right foot unna boot intact  Psychiatric: He has a normal mood and affect. His behavior is normal.     Labs reviewed: Admission on 04/05/2015, Discharged on 04/09/2015  Component Date Value Ref Range Status  . Lactic Acid, Venous 04/05/2015 0.68  0.5 - 2.0 mmol/L Final  . Troponin i, poc 04/05/2015 0.02  0.00 - 0.08 ng/mL Final  . Comment 3 04/05/2015          Final   Comment: Due to the release kinetics of cTnI, a negative result within the first hours of the onset of symptoms does not rule out myocardial infarction with certainty. If myocardial infarction is still suspected, repeat the test at appropriate intervals.   . WBC 04/05/2015 6.9  4.0 - 10.5 K/uL Final  . RBC 04/05/2015 3.53* 4.22 - 5.81 MIL/uL Final  . Hemoglobin 04/05/2015 9.6* 13.0 - 17.0 g/dL Final  . HCT 04/05/2015 29.3* 39.0 - 52.0 % Final  . MCV 04/05/2015 83.0  78.0 - 100.0 fL Final  . MCH 04/05/2015 27.2  26.0 - 34.0 pg Final  . MCHC 04/05/2015 32.8  30.0 - 36.0 g/dL Final  .  RDW 04/05/2015 14.1  11.5 - 15.5 % Final  . Platelets 04/05/2015 236  150 - 400 K/uL Final  . Neutrophils Relative % 04/05/2015 65   Final  . Neutro Abs 04/05/2015 4.5  1.7 - 7.7 K/uL Final  . Lymphocytes Relative 04/05/2015 23   Final  . Lymphs Abs 04/05/2015 1.6  0.7 - 4.0 K/uL Final  . Monocytes Relative 04/05/2015 5   Final  . Monocytes Absolute 04/05/2015 0.4  0.1 - 1.0 K/uL Final  . Eosinophils Relative 04/05/2015 7   Final  . Eosinophils Absolute 04/05/2015 0.5  0.0 - 0.7 K/uL Final  . Basophils Relative 04/05/2015 0   Final  . Basophils Absolute 04/05/2015 0.0  0.0 - 0.1 K/uL Final  . Sodium 04/05/2015 143  135 - 145 mmol/L Final  . Potassium 04/05/2015 4.7  3.5 - 5.1 mmol/L Final  . Chloride 04/05/2015 119* 101 - 111 mmol/L Final  . CO2 04/05/2015 20* 22 - 32 mmol/L Final  . Glucose, Bld 04/05/2015 171* 65 - 99 mg/dL Final  . BUN 04/05/2015 33* 6 - 20 mg/dL Final  . Creatinine, Ser 04/05/2015 2.01* 0.61 - 1.24 mg/dL Final  . Calcium 04/05/2015 8.0* 8.9 - 10.3 mg/dL Final  . Total Protein 04/05/2015 6.3* 6.5 - 8.1 g/dL Final  . Albumin 04/05/2015 2.6* 3.5 - 5.0 g/dL Final  . AST 04/05/2015 13* 15 - 41 U/L Final  . ALT 04/05/2015 8* 17 - 63 U/L Final  . Alkaline Phosphatase 04/05/2015 93  38 - 126 U/L Final  . Total Bilirubin 04/05/2015 0.4  0.3 - 1.2 mg/dL Final  . GFR calc non Af Amer 04/05/2015 32* >60 mL/min  Final  . GFR calc Af Amer 04/05/2015 37* >60 mL/min Final   Comment: (NOTE) The eGFR has been calculated using the CKD EPI equation. This calculation has not been validated in all clinical situations. eGFR's persistently <60 mL/min signify possible Chronic Kidney Disease.   . Anion gap 04/05/2015 4* 5 - 15 Final  . Color, Urine 04/05/2015 YELLOW  YELLOW Final  . APPearance 04/05/2015 TURBID* CLEAR Final  . Specific Gravity, Urine 04/05/2015 1.012  1.005 - 1.030 Final  . pH 04/05/2015 5.5  5.0 - 8.0 Final  . Glucose, UA 04/05/2015 NEGATIVE  NEGATIVE mg/dL Final    . Hgb urine dipstick 04/05/2015 LARGE* NEGATIVE Final  . Bilirubin Urine 04/05/2015 NEGATIVE  NEGATIVE Final  . Ketones, ur 04/05/2015 NEGATIVE  NEGATIVE mg/dL Final  . Protein, ur 04/05/2015 >300* NEGATIVE mg/dL Final  . Urobilinogen, UA 04/05/2015 0.2  0.0 - 1.0 mg/dL Final  . Nitrite 04/05/2015 NEGATIVE  NEGATIVE Final  . Leukocytes, UA 04/05/2015 LARGE* NEGATIVE Final  . B Natriuretic Peptide 04/05/2015 175.9* 0.0 - 100.0 pg/mL Final  . Specimen Description 04/05/2015 URINE, CATHETERIZED   Final  . Special Requests 04/05/2015 NONE   Final  . Culture 04/05/2015    Final                   Value:>=100,000 COLONIES/mL YEAST Performed at [facility]   . Report Status 04/05/2015 04/07/2015 FINAL   Final  . WBC, UA 04/05/2015 TOO NUMEROUS TO COUNT  <3 WBC/hpf Final  . RBC / HPF 04/05/2015 3-6  <3 RBC/hpf Final  . Bacteria, UA 04/05/2015 FEW* RARE Final  . Urine-Other 04/05/2015 MANY YEAST   Final  . Glucose-Capillary 04/05/2015 145* 65 - 99 mg/dL Final  . Glucose-Capillary 04/05/2015 181* 65 - 99 mg/dL Final  . Glucose-Capillary 04/06/2015 122* 65 - 99 mg/dL Final  . Glucose-Capillary 04/06/2015 155* 65 - 99 mg/dL Final  . Glucose-Capillary 04/06/2015 123* 65 - 99 mg/dL Final  . Glucose-Capillary 04/06/2015 181* 65 - 99 mg/dL Final  . Glucose-Capillary 04/07/2015 119* 65 - 99 mg/dL Final  . Comment 1 04/07/2015 Notify RN   Final  . Comment 2 04/07/2015 Document in Chart   Final  . Sodium 04/07/2015 141  135 - 145 mmol/L Final  . Potassium 04/07/2015 4.8  3.5 - 5.1 mmol/L Final  . Chloride 04/07/2015 118* 101 - 111 mmol/L Final  . CO2 04/07/2015 18* 22 - 32 mmol/L Final  . Glucose, Bld 04/07/2015 155* 65 - 99 mg/dL Final  . BUN 04/07/2015 42* 6 - 20 mg/dL Final  . Creatinine, Ser 04/07/2015 2.43* 0.61 - 1.24 mg/dL Final  . Calcium 04/07/2015 7.7* 8.9 - 10.3 mg/dL Final  . GFR calc non Af Amer 04/07/2015 25* >60 mL/min Final  . GFR calc Af Amer 04/07/2015 29* >60 mL/min  Final   Comment: (NOTE) The eGFR has been calculated using the CKD EPI equation. This calculation has not been validated in all clinical situations. eGFR's persistently <60 mL/min signify possible Chronic Kidney Disease.   . Anion gap 04/07/2015 5  5 - 15 Final  . WBC 04/07/2015 7.3  4.0 - 10.5 K/uL Final  . RBC 04/07/2015 2.99* 4.22 - 5.81 MIL/uL Final  . Hemoglobin 04/07/2015 8.3* 13.0 - 17.0 g/dL Final  . HCT 04/07/2015 25.2* 39.0 - 52.0 % Final  . MCV 04/07/2015 84.3  78.0 - 100.0 fL Final  . MCH 04/07/2015 27.8  26.0 - 34.0 pg Final  . MCHC 04/07/2015 32.9  30.0 - 36.0 g/dL Final  . RDW 04/07/2015 14.2  11.5 - 15.5 % Final  . Platelets 04/07/2015 208  150 - 400 K/uL Final  . Glucose-Capillary 04/07/2015 139* 65 - 99 mg/dL Final  . Comment 1 04/07/2015 Notify RN   Final  . Comment 2 04/07/2015 Document in Chart   Final  . Glucose-Capillary 04/07/2015 156* 65 - 99 mg/dL Final  . Comment 1 04/07/2015 Notify RN   Final  . Comment 2 04/07/2015 Document in Chart   Final  . Glucose-Capillary 04/07/2015 168* 65 - 99 mg/dL Final  . MRSA, PCR 04/08/2015 NEGATIVE  NEGATIVE Final  . Staphylococcus aureus 04/08/2015 POSITIVE* NEGATIVE Final   Comment:        The Xpert SA Assay (FDA approved for NASAL specimens in patients over 20 years of age), is one component of a comprehensive surveillance program.  Test performance has been validated by [facility] for patients greater than or equal to 51 year old. It is not intended to diagnose infection nor to guide or monitor treatment.   . Sodium 04/08/2015 141  135 - 145 mmol/L Final  . Potassium 04/08/2015 5.0  3.5 - 5.1 mmol/L Final  . Chloride 04/08/2015 118* 101 - 111 mmol/L Final  . CO2 04/08/2015 19* 22 - 32 mmol/L Final  . Glucose, Bld 04/08/2015 160* 65 - 99 mg/dL Final  . BUN 04/08/2015 46* 6 - 20 mg/dL Final  . Creatinine, Ser 04/08/2015 2.40* 0.61 - 1.24 mg/dL Final  . Calcium 04/08/2015 7.9* 8.9 - 10.3 mg/dL Final  . GFR  calc non Af Amer 04/08/2015 26* >60 mL/min Final  . GFR calc Af Amer 04/08/2015 30* >60 mL/min Final   Comment: (NOTE) The eGFR has been calculated using the CKD EPI equation. This calculation has not been validated in all clinical situations. eGFR's persistently <60 mL/min signify possible Chronic Kidney Disease.   . Anion gap 04/08/2015 4* 5 - 15 Final  . Glucose-Capillary 04/08/2015 148* 65 - 99 mg/dL Final  . Comment 1 04/08/2015 Notify RN   Final  . Glucose-Capillary 04/08/2015 149* 65 - 99 mg/dL Final  . Comment 1 04/08/2015 Notify RN   Final  . Glucose-Capillary 04/08/2015 220* 65 - 99 mg/dL Final  . Glucose-Capillary 04/08/2015 182* 65 - 99 mg/dL Final  . Glucose-Capillary 04/09/2015 126* 65 - 99 mg/dL Final  . Sodium 04/09/2015 141  135 - 145 mmol/L Final  . Potassium 04/09/2015 4.8  3.5 - 5.1 mmol/L Final  . Chloride 04/09/2015 117* 101 - 111 mmol/L Final  . CO2 04/09/2015 19* 22 - 32 mmol/L Final  . Glucose, Bld 04/09/2015 186* 65 - 99 mg/dL Final  . BUN 04/09/2015 47* 6 - 20 mg/dL Final  . Creatinine, Ser 04/09/2015 2.09* 0.61 - 1.24 mg/dL Final  . Calcium 04/09/2015 8.1* 8.9 - 10.3 mg/dL Final  . GFR calc non Af Amer 04/09/2015 30* >60 mL/min Final  . GFR calc Af Amer 04/09/2015 35* >60 mL/min Final   Comment: (NOTE) The eGFR has been calculated using the CKD EPI equation. This calculation has not been validated in all clinical situations. eGFR's persistently <60 mL/min signify possible Chronic Kidney Disease.   . Anion gap 04/09/2015 5  5 - 15 Final  . Glucose-Capillary 04/09/2015 133* 65 - 99 mg/dL Final  . Glucose-Capillary 04/09/2015 162* 65 - 99 mg/dL Final  . Comment 1 04/09/2015 Notify RN   Final    No results found.   Assessment/Plan   ICD-9-CM ICD-10-CM  1. Essential hypertension 401.9 I10   2. Acute encephalopathy - improving 348.30 G93.40   3. Pain in joint involving multiple sites 719.49 M25.50   4. S/P BKA (below knee amputation) unilateral,  left (HCC) V49.75 Z89.512   5. Chronic diastolic CHF (congestive heart failure) (HCC) 428.32 I50.32    428.0    6. DM (diabetes mellitus), type 2, uncontrolled, periph vascular complic (HCC) 012.39 P59.40    443.81 E11.65   7. Anemia of chronic disease 285.29 D63.8   8. Chronic renal failure, stage 4 (severe) (HCC) 585.4 N18.4     Cont current meds as ordered  PT/OT/ST as ordered  F/u with specialists as scheduled  GOAL: short term rehab and d/c home when medically appropriate. Communicated with pt and nursing.  Will follow  Kordell Jafri S. Perlie Gold  [facility] [address] [place], [place] [address] [phone] Cell (Monday-Friday 8 AM - 5 PM) [phone] After 5 PM and follow prompts

## 2015-04-26 ENCOUNTER — Non-Acute Institutional Stay (SKILLED_NURSING_FACILITY): Payer: Medicare Other | Admitting: Adult Health

## 2015-04-26 DIAGNOSIS — E1165 Type 2 diabetes mellitus with hyperglycemia: Secondary | ICD-10-CM | POA: Diagnosis not present

## 2015-04-26 DIAGNOSIS — E1151 Type 2 diabetes mellitus with diabetic peripheral angiopathy without gangrene: Secondary | ICD-10-CM

## 2015-04-26 DIAGNOSIS — IMO0002 Reserved for concepts with insufficient information to code with codable children: Secondary | ICD-10-CM

## 2015-04-26 DIAGNOSIS — N3000 Acute cystitis without hematuria: Secondary | ICD-10-CM | POA: Diagnosis not present

## 2015-04-26 DIAGNOSIS — I5032 Chronic diastolic (congestive) heart failure: Secondary | ICD-10-CM

## 2015-04-28 ENCOUNTER — Encounter: Payer: Self-pay | Admitting: Adult Health

## 2015-04-28 NOTE — Progress Notes (Signed)
Patient ID: Justin Horsch Sr., male   DOB: [birth year], 69 y.o.   MRN: [id number]    Facility: [facility]      Allergies  Allergen Reactions  . Ancef [Cefazolin] Itching and Rash  . Sulfa Antibiotics Itching and Rash    Chief Complaint  Patient presents with  . Discharge Note    HPI:  He is being discharged to home with home health for pt/ot/rn. He will need a hoyer lift; he has all other needed dme. He will need his prescriptions to be written and will need to follow up with his pcp      Past Medical History  Diagnosis Date  . Diabetes mellitus   . Hypertension   . Hyperlipidemia   . CHF (congestive heart failure) (HCC)   . Enlarged heart   . Pneumonia     hx of   . Osteomyelitis of foot (HCC)     LT  . CKD (chronic kidney disease), stage IV (HCC)   . Shortness of breath   . GERD (gastroesophageal reflux disease)     Past Surgical History  Procedure Laterality Date  . Right leg fracture    . Skin graft    . Knee surgery    . Tee without cardioversion  03/17/2012    Procedure: TRANSESOPHAGEAL ECHOCARDIOGRAM (TEE);  Surgeon: Ricki Rodriguez, MD;  Location: [facility];  Service: Cardiovascular;  Laterality: N/A;  . Incision and drainage Left 07/02/2013    Procedure: INCISION AND DRAINAGE LEFT FOOT;  Surgeon: Kathryne Hitch, MD;  Location: [facility];  Service: Orthopedics;  Laterality: Left;  . Amputation Left 07/18/2013    Procedure: AMPUTATION BELOW KNEE;  Surgeon: Kathryne Hitch, MD;  [facility];  Service: Orthopedics;  Laterality: Left;    VITAL SIGNS BP 132/64 mmHg  Pulse 60  Ht  (1.778 m)  Wt 225 lb (102.059 kg)  BMI 32.28 kg/m2  SpO2 95%  Patient's Medications  New Prescriptions   No medications on file  Previous Medications   AMLODIPINE (NORVASC) 5 MG TABLET    Take 5 mg by mouth daily.   APIXABAN (ELIQUIS) 2.5 MG TABS TABLET    Take 2.5 mg by mouth 2 (two) times daily.   CARVEDILOL (COREG) 12.5 MG TABLET    Take 1 tablet  (12.5 mg total) by mouth 2 (two) times daily with a meal.   CLONIDINE (CATAPRES) 0.1 MG TABLET    Take 0.1 mg by mouth 3 (three) times daily.    FUROSEMIDE (LASIX) 40 MG TABLET    Take 40 mg by mouth daily.   HYDROXYZINE (ATARAX/VISTARIL) 10 MG TABLET    Take 10 mg by mouth 3 (three) times daily as needed for itching.   INSULIN ASPART (NOVOLOG) 100 UNIT/ML INJECTION    Inject into the skin 3 (three) times daily with meals. SSI 200-300   7 UNITS; 301-400  9 UNITS; 401-500   12  UNITS; > 500 14  UNITS AND CALL MD   CBG AC AND QHS   LIVER OIL-ZINC OXIDE (DESITIN) 40 % OINTMENT    Apply topically 2 (two) times daily at 10 am and 4 pm.   ONDANSETRON (ZOFRAN) 4 MG TABLET    Take 1 tablet (4 mg total) by mouth every 6 (six) hours as needed for nausea.   POTASSIUM CHLORIDE (K-DUR) 10 MEQ TABLET    Take 10 mEq by mouth 2 (two) times daily.   TAMSULOSIN HCL (FLOMAX) 0.4 MG CAPS  Take 1 capsule (0.4 mg total) by mouth daily.   TRAMADOL (ULTRAM) 50 MG TABLET    Take 1 tablet (50 mg total) by mouth every 6 (six) hours as needed for moderate pain.  Modified Medications   No medications on file  Discontinued Medications   No medications on file     SIGNIFICANT DIAGNOSTIC EXAMS   Review of Systems  Constitutional: Negative for malaise/fatigue.  Respiratory: Negative for cough.   Cardiovascular: Negative for chest pain.  Gastrointestinal: Negative for abdominal pain.  Musculoskeletal: Negative for myalgias and joint pain.  Skin: Negative.   Psychiatric/Behavioral: The patient is not nervous/anxious.     Physical Exam  Constitutional: No distress.  Eyes: Conjunctivae are normal.  Neck: Neck supple. No JVD present. No thyromegaly present.  Cardiovascular: Normal rate, regular rhythm and intact distal pulses.   Respiratory: Effort normal and breath sounds normal. No respiratory distress. He has no wheezes.  GI: Soft. Bowel sounds are normal. He exhibits no distension. There is no tenderness.    Musculoskeletal: He exhibits no edema.  Able to move all extremities   Lymphadenopathy:    He has no cervical adenopathy.  Neurological: He is alert.  Skin: Skin is warm and dry. He is not diaphoretic.  Psychiatric: He has a normal mood and affect.       ASSESSMENT/ PLAN:  Will discharge him to home with home health to evaluate and treat as indicated for gait, balance, adl training, medication management. He will need a hoyer lift to assist with transfers. His prescriptions have been written for a 30 day supply of his medications. He will follow up with Dr. Mirna MiresGerald [name] on 05-07-15 at 1:45 pm    Time spent with patient  40  minutes >50% time spent counseling; reviewing medical record; tests; labs; and developing future plan of care      Synthia InnocentDeborah Green NP [facility]  Contact [phone] Monday through Friday 8am- 5pm  After hours call [phone]

## 2015-05-05 DIAGNOSIS — E1122 Type 2 diabetes mellitus with diabetic chronic kidney disease: Secondary | ICD-10-CM | POA: Diagnosis not present

## 2015-05-05 DIAGNOSIS — Z7901 Long term (current) use of anticoagulants: Secondary | ICD-10-CM | POA: Diagnosis not present

## 2015-05-05 DIAGNOSIS — Z89512 Acquired absence of left leg below knee: Secondary | ICD-10-CM | POA: Diagnosis not present

## 2015-05-05 DIAGNOSIS — N184 Chronic kidney disease, stage 4 (severe): Secondary | ICD-10-CM | POA: Diagnosis not present

## 2015-05-05 DIAGNOSIS — I5032 Chronic diastolic (congestive) heart failure: Secondary | ICD-10-CM | POA: Diagnosis not present

## 2015-05-05 DIAGNOSIS — I130 Hypertensive heart and chronic kidney disease with heart failure and stage 1 through stage 4 chronic kidney disease, or unspecified chronic kidney disease: Secondary | ICD-10-CM | POA: Diagnosis not present

## 2015-05-05 DIAGNOSIS — R238 Other skin changes: Secondary | ICD-10-CM | POA: Diagnosis not present

## 2015-05-05 DIAGNOSIS — Z87440 Personal history of urinary (tract) infections: Secondary | ICD-10-CM | POA: Diagnosis not present

## 2015-05-05 DIAGNOSIS — L89890 Pressure ulcer of other site, unstageable: Secondary | ICD-10-CM | POA: Diagnosis not present

## 2015-05-05 DIAGNOSIS — Z794 Long term (current) use of insulin: Secondary | ICD-10-CM | POA: Diagnosis not present

## 2015-05-10 DIAGNOSIS — N184 Chronic kidney disease, stage 4 (severe): Secondary | ICD-10-CM | POA: Diagnosis not present

## 2015-05-10 DIAGNOSIS — Z87440 Personal history of urinary (tract) infections: Secondary | ICD-10-CM | POA: Diagnosis not present

## 2015-05-10 DIAGNOSIS — E1122 Type 2 diabetes mellitus with diabetic chronic kidney disease: Secondary | ICD-10-CM | POA: Diagnosis not present

## 2015-05-10 DIAGNOSIS — R238 Other skin changes: Secondary | ICD-10-CM | POA: Diagnosis not present

## 2015-05-10 DIAGNOSIS — I130 Hypertensive heart and chronic kidney disease with heart failure and stage 1 through stage 4 chronic kidney disease, or unspecified chronic kidney disease: Secondary | ICD-10-CM | POA: Diagnosis not present

## 2015-05-10 DIAGNOSIS — Z794 Long term (current) use of insulin: Secondary | ICD-10-CM | POA: Diagnosis not present

## 2015-05-10 DIAGNOSIS — Z7901 Long term (current) use of anticoagulants: Secondary | ICD-10-CM | POA: Diagnosis not present

## 2015-05-10 DIAGNOSIS — Z89512 Acquired absence of left leg below knee: Secondary | ICD-10-CM | POA: Diagnosis not present

## 2015-05-10 DIAGNOSIS — L89890 Pressure ulcer of other site, unstageable: Secondary | ICD-10-CM | POA: Diagnosis not present

## 2015-05-10 DIAGNOSIS — I5032 Chronic diastolic (congestive) heart failure: Secondary | ICD-10-CM | POA: Diagnosis not present

## 2015-05-13 DIAGNOSIS — E1122 Type 2 diabetes mellitus with diabetic chronic kidney disease: Secondary | ICD-10-CM | POA: Diagnosis not present

## 2015-05-13 DIAGNOSIS — Z7901 Long term (current) use of anticoagulants: Secondary | ICD-10-CM | POA: Diagnosis not present

## 2015-05-13 DIAGNOSIS — Z794 Long term (current) use of insulin: Secondary | ICD-10-CM | POA: Diagnosis not present

## 2015-05-13 DIAGNOSIS — L89890 Pressure ulcer of other site, unstageable: Secondary | ICD-10-CM | POA: Diagnosis not present

## 2015-05-13 DIAGNOSIS — Z89512 Acquired absence of left leg below knee: Secondary | ICD-10-CM | POA: Diagnosis not present

## 2015-05-13 DIAGNOSIS — N184 Chronic kidney disease, stage 4 (severe): Secondary | ICD-10-CM | POA: Diagnosis not present

## 2015-05-13 DIAGNOSIS — Z87440 Personal history of urinary (tract) infections: Secondary | ICD-10-CM | POA: Diagnosis not present

## 2015-05-13 DIAGNOSIS — I130 Hypertensive heart and chronic kidney disease with heart failure and stage 1 through stage 4 chronic kidney disease, or unspecified chronic kidney disease: Secondary | ICD-10-CM | POA: Diagnosis not present

## 2015-05-13 DIAGNOSIS — I5032 Chronic diastolic (congestive) heart failure: Secondary | ICD-10-CM | POA: Diagnosis not present

## 2015-05-13 DIAGNOSIS — R238 Other skin changes: Secondary | ICD-10-CM | POA: Diagnosis not present

## 2015-05-14 ENCOUNTER — Emergency Department (HOSPITAL_COMMUNITY): Payer: Medicare Other

## 2015-05-14 ENCOUNTER — Inpatient Hospital Stay (HOSPITAL_COMMUNITY)
Admission: EM | Admit: 2015-05-14 | Discharge: 2015-05-24 | DRG: 255 | Disposition: A | Discharge status: Hospice | Payer: Medicare Other | Attending: Internal Medicine | Admitting: Internal Medicine

## 2015-05-14 ENCOUNTER — Inpatient Hospital Stay (HOSPITAL_COMMUNITY): Payer: Medicare Other

## 2015-05-14 ENCOUNTER — Encounter (HOSPITAL_COMMUNITY): Payer: Self-pay | Admitting: Emergency Medicine

## 2015-05-14 DIAGNOSIS — N179 Acute kidney failure, unspecified: Secondary | ICD-10-CM | POA: Diagnosis not present

## 2015-05-14 DIAGNOSIS — L03031 Cellulitis of right toe: Secondary | ICD-10-CM | POA: Diagnosis not present

## 2015-05-14 DIAGNOSIS — E875 Hyperkalemia: Secondary | ICD-10-CM | POA: Diagnosis not present

## 2015-05-14 DIAGNOSIS — N19 Unspecified kidney failure: Secondary | ICD-10-CM

## 2015-05-14 DIAGNOSIS — S91104A Unspecified open wound of right lesser toe(s) without damage to nail, initial encounter: Secondary | ICD-10-CM | POA: Diagnosis not present

## 2015-05-14 DIAGNOSIS — E1122 Type 2 diabetes mellitus with diabetic chronic kidney disease: Secondary | ICD-10-CM | POA: Diagnosis present

## 2015-05-14 DIAGNOSIS — Z87891 Personal history of nicotine dependence: Secondary | ICD-10-CM

## 2015-05-14 DIAGNOSIS — N171 Acute kidney failure with acute cortical necrosis: Secondary | ICD-10-CM | POA: Diagnosis not present

## 2015-05-14 DIAGNOSIS — L03115 Cellulitis of right lower limb: Secondary | ICD-10-CM | POA: Insufficient documentation

## 2015-05-14 DIAGNOSIS — D72829 Elevated white blood cell count, unspecified: Secondary | ICD-10-CM | POA: Diagnosis present

## 2015-05-14 DIAGNOSIS — T501X5A Adverse effect of loop [high-ceiling] diuretics, initial encounter: Secondary | ICD-10-CM | POA: Diagnosis present

## 2015-05-14 DIAGNOSIS — Z515 Encounter for palliative care: Secondary | ICD-10-CM | POA: Diagnosis present

## 2015-05-14 DIAGNOSIS — Z89512 Acquired absence of left leg below knee: Secondary | ICD-10-CM | POA: Diagnosis not present

## 2015-05-14 DIAGNOSIS — M79671 Pain in right foot: Secondary | ICD-10-CM | POA: Diagnosis not present

## 2015-05-14 DIAGNOSIS — I70261 Atherosclerosis of native arteries of extremities with gangrene, right leg: Secondary | ICD-10-CM | POA: Diagnosis not present

## 2015-05-14 DIAGNOSIS — Z881 Allergy status to other antibiotic agents status: Secondary | ICD-10-CM | POA: Diagnosis not present

## 2015-05-14 DIAGNOSIS — E1159 Type 2 diabetes mellitus with other circulatory complications: Secondary | ICD-10-CM

## 2015-05-14 DIAGNOSIS — E1121 Type 2 diabetes mellitus with diabetic nephropathy: Secondary | ICD-10-CM | POA: Diagnosis not present

## 2015-05-14 DIAGNOSIS — E1165 Type 2 diabetes mellitus with hyperglycemia: Secondary | ICD-10-CM

## 2015-05-14 DIAGNOSIS — E872 Acidosis, unspecified: Secondary | ICD-10-CM | POA: Diagnosis present

## 2015-05-14 DIAGNOSIS — I5042 Chronic combined systolic (congestive) and diastolic (congestive) heart failure: Secondary | ICD-10-CM | POA: Diagnosis not present

## 2015-05-14 DIAGNOSIS — Z66 Do not resuscitate: Secondary | ICD-10-CM | POA: Diagnosis not present

## 2015-05-14 DIAGNOSIS — Z86711 Personal history of pulmonary embolism: Secondary | ICD-10-CM | POA: Diagnosis not present

## 2015-05-14 DIAGNOSIS — Z7901 Long term (current) use of anticoagulants: Secondary | ICD-10-CM | POA: Diagnosis not present

## 2015-05-14 DIAGNOSIS — Z794 Long term (current) use of insulin: Secondary | ICD-10-CM | POA: Diagnosis not present

## 2015-05-14 DIAGNOSIS — E869 Volume depletion, unspecified: Secondary | ICD-10-CM | POA: Diagnosis present

## 2015-05-14 DIAGNOSIS — D631 Anemia in chronic kidney disease: Secondary | ICD-10-CM | POA: Diagnosis present

## 2015-05-14 DIAGNOSIS — Z882 Allergy status to sulfonamides status: Secondary | ICD-10-CM

## 2015-05-14 DIAGNOSIS — L0390 Cellulitis, unspecified: Secondary | ICD-10-CM | POA: Diagnosis not present

## 2015-05-14 DIAGNOSIS — I739 Peripheral vascular disease, unspecified: Secondary | ICD-10-CM | POA: Diagnosis not present

## 2015-05-14 DIAGNOSIS — I10 Essential (primary) hypertension: Secondary | ICD-10-CM | POA: Diagnosis present

## 2015-05-14 DIAGNOSIS — M869 Osteomyelitis, unspecified: Secondary | ICD-10-CM | POA: Diagnosis not present

## 2015-05-14 DIAGNOSIS — I129 Hypertensive chronic kidney disease with stage 1 through stage 4 chronic kidney disease, or unspecified chronic kidney disease: Secondary | ICD-10-CM | POA: Diagnosis present

## 2015-05-14 DIAGNOSIS — Z6841 Body Mass Index (BMI) 40.0 and over, adult: Secondary | ICD-10-CM | POA: Diagnosis not present

## 2015-05-14 DIAGNOSIS — K219 Gastro-esophageal reflux disease without esophagitis: Secondary | ICD-10-CM | POA: Diagnosis present

## 2015-05-14 DIAGNOSIS — IMO0002 Reserved for concepts with insufficient information to code with codable children: Secondary | ICD-10-CM

## 2015-05-14 DIAGNOSIS — Z833 Family history of diabetes mellitus: Secondary | ICD-10-CM

## 2015-05-14 DIAGNOSIS — L89151 Pressure ulcer of sacral region, stage 1: Secondary | ICD-10-CM | POA: Diagnosis present

## 2015-05-14 DIAGNOSIS — G9340 Encephalopathy, unspecified: Secondary | ICD-10-CM | POA: Diagnosis present

## 2015-05-14 DIAGNOSIS — Z841 Family history of disorders of kidney and ureter: Secondary | ICD-10-CM

## 2015-05-14 DIAGNOSIS — Z79899 Other long term (current) drug therapy: Secondary | ICD-10-CM | POA: Diagnosis not present

## 2015-05-14 DIAGNOSIS — E1142 Type 2 diabetes mellitus with diabetic polyneuropathy: Secondary | ICD-10-CM | POA: Diagnosis not present

## 2015-05-14 DIAGNOSIS — N184 Chronic kidney disease, stage 4 (severe): Secondary | ICD-10-CM | POA: Diagnosis present

## 2015-05-14 DIAGNOSIS — Z7401 Bed confinement status: Secondary | ICD-10-CM | POA: Diagnosis not present

## 2015-05-14 DIAGNOSIS — E785 Hyperlipidemia, unspecified: Secondary | ICD-10-CM | POA: Diagnosis present

## 2015-05-14 DIAGNOSIS — Z89519 Acquired absence of unspecified leg below knee: Secondary | ICD-10-CM

## 2015-05-14 DIAGNOSIS — I5043 Acute on chronic combined systolic (congestive) and diastolic (congestive) heart failure: Secondary | ICD-10-CM | POA: Diagnosis present

## 2015-05-14 DIAGNOSIS — Z8249 Family history of ischemic heart disease and other diseases of the circulatory system: Secondary | ICD-10-CM | POA: Diagnosis not present

## 2015-05-14 DIAGNOSIS — M79604 Pain in right leg: Secondary | ICD-10-CM | POA: Diagnosis present

## 2015-05-14 DIAGNOSIS — E08622 Diabetes mellitus due to underlying condition with other skin ulcer: Secondary | ICD-10-CM | POA: Diagnosis not present

## 2015-05-14 DIAGNOSIS — E119 Type 2 diabetes mellitus without complications: Secondary | ICD-10-CM | POA: Diagnosis not present

## 2015-05-14 DIAGNOSIS — E1152 Type 2 diabetes mellitus with diabetic peripheral angiopathy with gangrene: Secondary | ICD-10-CM | POA: Diagnosis not present

## 2015-05-14 HISTORY — DX: Type 2 diabetes mellitus without complications: E11.9

## 2015-05-14 HISTORY — DX: Other pulmonary embolism without acute cor pulmonale: I26.99

## 2015-05-14 HISTORY — DX: Chronic kidney disease, unspecified: N18.9

## 2015-05-14 HISTORY — DX: Hyperlipidemia, unspecified: E78.5

## 2015-05-14 LAB — GLUCOSE, CAPILLARY
GLUCOSE-CAPILLARY: 81 mg/dL (ref 65–99)
Glucose-Capillary: 144 mg/dL — ABNORMAL HIGH (ref 65–99)

## 2015-05-14 LAB — CBC WITH DIFFERENTIAL/PLATELET
BASOS ABS: 0 10*3/uL (ref 0.0–0.1)
BASOS ABS: 0 10*3/uL (ref 0.0–0.1)
Basophils Relative: 0 %
Basophils Relative: 0 %
Eosinophils Absolute: 0.5 10*3/uL (ref 0.0–0.7)
Eosinophils Absolute: 0.5 10*3/uL (ref 0.0–0.7)
Eosinophils Relative: 5 %
Eosinophils Relative: 4 %
HEMATOCRIT: 30.2 % — AB (ref 39.0–52.0)
HEMATOCRIT: 28.3 % — AB (ref 39.0–52.0)
HEMOGLOBIN: 9.9 g/dL — AB (ref 13.0–17.0)
HEMOGLOBIN: 9 g/dL — AB (ref 13.0–17.0)
LYMPHS PCT: 12 %
LYMPHS PCT: 11 %
Lymphs Abs: 1.3 10*3/uL (ref 0.7–4.0)
Lymphs Abs: 1.4 10*3/uL (ref 0.7–4.0)
MCH: 27.2 pg (ref 26.0–34.0)
MCH: 26.8 pg (ref 26.0–34.0)
MCHC: 32.8 g/dL (ref 30.0–36.0)
MCHC: 31.8 g/dL (ref 30.0–36.0)
MCV: 83 fL (ref 78.0–100.0)
MCV: 84.2 fL (ref 78.0–100.0)
MONO ABS: 0.7 10*3/uL (ref 0.1–1.0)
MONO ABS: 1 10*3/uL (ref 0.1–1.0)
MONOS PCT: 6 %
Monocytes Relative: 8 %
NEUTROS ABS: 8.3 10*3/uL — AB (ref 1.7–7.7)
NEUTROS ABS: 9.4 10*3/uL — AB (ref 1.7–7.7)
NEUTROS PCT: 77 %
Neutrophils Relative %: 77 %
Platelets: 338 10*3/uL (ref 150–400)
Platelets: 309 10*3/uL (ref 150–400)
RBC: 3.64 MIL/uL — ABNORMAL LOW (ref 4.22–5.81)
RBC: 3.36 MIL/uL — ABNORMAL LOW (ref 4.22–5.81)
RDW: 13.8 % (ref 11.5–15.5)
RDW: 13.8 % (ref 11.5–15.5)
WBC: 10.8 10*3/uL — ABNORMAL HIGH (ref 4.0–10.5)
WBC: 12.3 10*3/uL — ABNORMAL HIGH (ref 4.0–10.5)

## 2015-05-14 LAB — BASIC METABOLIC PANEL
ANION GAP: 4 — AB (ref 5–15)
Anion gap: 5 (ref 5–15)
BUN: 51 mg/dL — ABNORMAL HIGH (ref 6–20)
BUN: 50 mg/dL — ABNORMAL HIGH (ref 6–20)
CO2: 16 mmol/L — ABNORMAL LOW (ref 22–32)
CO2: 19 mmol/L — AB (ref 22–32)
CREATININE: 2.64 mg/dL — AB (ref 0.61–1.24)
Calcium: 8.6 mg/dL — ABNORMAL LOW (ref 8.9–10.3)
Calcium: 8.8 mg/dL — ABNORMAL LOW (ref 8.9–10.3)
Chloride: 120 mmol/L — ABNORMAL HIGH (ref 101–111)
Chloride: 120 mmol/L — ABNORMAL HIGH (ref 101–111)
Creatinine, Ser: 2.6 mg/dL — ABNORMAL HIGH (ref 0.61–1.24)
GFR calc non Af Amer: 23 mL/min — ABNORMAL LOW (ref >60)
GFR, EST AFRICAN AMERICAN: 27 mL/min — AB (ref >60)
GFR, EST AFRICAN AMERICAN: 27 mL/min — AB (ref >60)
GFR, EST NON AFRICAN AMERICAN: 23 mL/min — AB (ref >60)
GLUCOSE: 97 mg/dL (ref 65–99)
Glucose, Bld: 77 mg/dL (ref 65–99)
POTASSIUM: 7.3 mmol/L — AB (ref 3.5–5.1)
Potassium: 6.5 mmol/L (ref 3.5–5.1)
SODIUM: 144 mmol/L (ref 135–145)
Sodium: 140 mmol/L (ref 135–145)

## 2015-05-14 LAB — CBG MONITORING, ED
Glucose-Capillary: 104 mg/dL — ABNORMAL HIGH (ref 65–99)
Glucose-Capillary: 136 mg/dL — ABNORMAL HIGH (ref 65–99)

## 2015-05-14 LAB — SEDIMENTATION RATE: SED RATE: 136 mm/h — AB (ref 0–16)

## 2015-05-14 LAB — PROTIME-INR
INR: 1.41 (ref 0.00–1.49)
PROTHROMBIN TIME: 17.4 s — AB (ref 11.6–15.2)

## 2015-05-14 LAB — RENAL FUNCTION PANEL
ALBUMIN: 2.5 g/dL — AB (ref 3.5–5.0)
ANION GAP: 6 (ref 5–15)
BUN: 51 mg/dL — ABNORMAL HIGH (ref 6–20)
CALCIUM: 8.8 mg/dL — AB (ref 8.9–10.3)
CO2: 18 mmol/L — ABNORMAL LOW (ref 22–32)
CREATININE: 2.56 mg/dL — AB (ref 0.61–1.24)
Chloride: 120 mmol/L — ABNORMAL HIGH (ref 101–111)
GFR, EST AFRICAN AMERICAN: 28 mL/min — AB (ref >60)
GFR, EST NON AFRICAN AMERICAN: 24 mL/min — AB (ref >60)
Glucose, Bld: 78 mg/dL (ref 65–99)
PHOSPHORUS: 5 mg/dL — AB (ref 2.5–4.6)
Potassium: 6.5 mmol/L (ref 3.5–5.1)
SODIUM: 144 mmol/L (ref 135–145)

## 2015-05-14 LAB — I-STAT CG4 LACTIC ACID, ED
Lactic Acid, Venous: 0.55 mmol/L (ref 0.5–2.0)
Lactic Acid, Venous: 0.71 mmol/L (ref 0.5–2.0)

## 2015-05-14 LAB — MAGNESIUM: Magnesium: 2.3 mg/dL (ref 1.7–2.4)

## 2015-05-14 LAB — C-REACTIVE PROTEIN: CRP: 13.7 mg/dL — ABNORMAL HIGH (ref <1.0)

## 2015-05-14 LAB — MRSA PCR SCREENING: MRSA BY PCR: NEGATIVE

## 2015-05-14 LAB — TSH: TSH: 4.088 u[IU]/mL (ref 0.350–4.500)

## 2015-05-14 LAB — APTT: aPTT: 41 seconds — ABNORMAL HIGH (ref 24–37)

## 2015-05-14 LAB — PREALBUMIN: Prealbumin: 14.6 mg/dL — ABNORMAL LOW (ref 18–38)

## 2015-05-14 LAB — PHOSPHORUS: PHOSPHORUS: 5.1 mg/dL — AB (ref 2.5–4.6)

## 2015-05-14 MED ORDER — ALBUTEROL SULFATE (2.5 MG/3ML) 0.083% IN NEBU: 10.0000 mg | INHALATION_SOLUTION | Freq: Once | RESPIRATORY_TRACT | Status: DC

## 2015-05-14 MED ORDER — MORPHINE SULFATE (PF) 4 MG/ML IV SOLN
4.0000 mg | INTRAVENOUS | Status: AC | PRN
  Administered 2015-05-16 – 2015-05-17 (×3): 4 mg via INTRAVENOUS
  Filled 2015-05-14 (×3): qty 1

## 2015-05-14 MED ORDER — TAMSULOSIN HCL 0.4 MG PO CAPS
0.4000 mg | ORAL_CAPSULE | Freq: Every day | ORAL | Status: DC
  Administered 2015-05-14 – 2015-05-24 (×11): 0.4 mg via ORAL
  Filled 2015-05-14 (×11): qty 1

## 2015-05-14 MED ORDER — TRAMADOL HCL 50 MG PO TABS
50.0000 mg | ORAL_TABLET | Freq: Four times a day (QID) | ORAL | Status: DC | PRN
  Administered 2015-05-21: 50 mg via ORAL
  Filled 2015-05-14: qty 1

## 2015-05-14 MED ORDER — CLONIDINE HCL 0.1 MG PO TABS
0.1000 mg | ORAL_TABLET | Freq: Three times a day (TID) | ORAL | Status: DC
  Administered 2015-05-14 – 2015-05-24 (×27): 0.1 mg via ORAL
  Filled 2015-05-14 (×35): qty 1

## 2015-05-14 MED ORDER — ATROPINE SULFATE 0.1 MG/ML IJ SOLN
INTRAMUSCULAR | Status: AC
  Filled 2015-05-14: qty 10

## 2015-05-14 MED ORDER — BISACODYL 5 MG PO TBEC: 5.0000 mg | DELAYED_RELEASE_TABLET | Freq: Every day | ORAL | Status: DC | PRN

## 2015-05-14 MED ORDER — SODIUM BICARBONATE 8.4 % IV SOLN
50.0000 meq | Freq: Once | INTRAVENOUS | Status: AC
  Administered 2015-05-14: 50 meq via INTRAVENOUS

## 2015-05-14 MED ORDER — ONDANSETRON HCL 4 MG/2ML IJ SOLN: 4.0000 mg | Freq: Four times a day (QID) | INTRAMUSCULAR | Status: DC | PRN

## 2015-05-14 MED ORDER — HYDROXYZINE HCL 10 MG PO TABS
10.0000 mg | ORAL_TABLET | Freq: Three times a day (TID) | ORAL | Status: DC | PRN
  Administered 2015-05-16 – 2015-05-23 (×6): 10 mg via ORAL
  Filled 2015-05-14 (×9): qty 1

## 2015-05-14 MED ORDER — SODIUM CHLORIDE 0.9 % IV SOLN
INTRAVENOUS | Status: DC
  Administered 2015-05-15: 17:00:00 via INTRAVENOUS
  Administered 2015-05-16: 1000 mL via INTRAVENOUS
  Administered 2015-05-16 – 2015-05-17 (×4): via INTRAVENOUS

## 2015-05-14 MED ORDER — STERILE WATER FOR INJECTION IV SOLN
150.0000 meq | INTRAVENOUS | Status: DC
  Administered 2015-05-14 – 2015-05-15 (×2): 150 meq via INTRAVENOUS
  Filled 2015-05-14 (×3): qty 850

## 2015-05-14 MED ORDER — ACETAMINOPHEN 325 MG PO TABS: 650.0000 mg | ORAL_TABLET | Freq: Four times a day (QID) | ORAL | Status: DC | PRN

## 2015-05-14 MED ORDER — CLINDAMYCIN PHOSPHATE 600 MG/50ML IV SOLN
600.0000 mg | Freq: Three times a day (TID) | INTRAVENOUS | Status: DC
  Administered 2015-05-15 – 2015-05-22 (×24): 600 mg via INTRAVENOUS
  Filled 2015-05-14 (×26): qty 50

## 2015-05-14 MED ORDER — AMLODIPINE BESYLATE 5 MG PO TABS
5.0000 mg | ORAL_TABLET | Freq: Every day | ORAL | Status: DC
  Administered 2015-05-14 – 2015-05-24 (×11): 5 mg via ORAL
  Filled 2015-05-14 (×12): qty 1

## 2015-05-14 MED ORDER — ALBUTEROL (5 MG/ML) CONTINUOUS INHALATION SOLN
10.0000 mg/h | INHALATION_SOLUTION | Freq: Once | RESPIRATORY_TRACT | Status: AC
  Administered 2015-05-14: 10 mg/h via RESPIRATORY_TRACT
  Filled 2015-05-14: qty 20

## 2015-05-14 MED ORDER — SODIUM CHLORIDE 0.9 % IV SOLN
1.0000 g | Freq: Once | INTRAVENOUS | Status: AC
  Administered 2015-05-14: 1 g via INTRAVENOUS
  Filled 2015-05-14: qty 10

## 2015-05-14 MED ORDER — SODIUM BICARBONATE 8.4 % IV SOLN
INTRAVENOUS | Status: AC
  Filled 2015-05-14: qty 50

## 2015-05-14 MED ORDER — CLINDAMYCIN PHOSPHATE 600 MG/50ML IV SOLN
600.0000 mg | Freq: Once | INTRAVENOUS | Status: AC
  Administered 2015-05-14: 600 mg via INTRAVENOUS
  Filled 2015-05-14: qty 50

## 2015-05-14 MED ORDER — INSULIN ASPART 100 UNIT/ML IV SOLN
10.0000 [IU] | Freq: Once | INTRAVENOUS | Status: AC
  Administered 2015-05-14: 10 [IU] via INTRAVENOUS
  Filled 2015-05-14: qty 0.1

## 2015-05-14 MED ORDER — INSULIN ASPART 100 UNIT/ML ~~LOC~~ SOLN
0.0000 [IU] | Freq: Three times a day (TID) | SUBCUTANEOUS | Status: DC
  Administered 2015-05-15 – 2015-05-16 (×3): 3 [IU] via SUBCUTANEOUS
  Administered 2015-05-16: 2 [IU] via SUBCUTANEOUS
  Administered 2015-05-16: 3 [IU] via SUBCUTANEOUS
  Administered 2015-05-17: 2 [IU] via SUBCUTANEOUS
  Administered 2015-05-17: 3 [IU] via SUBCUTANEOUS
  Administered 2015-05-17 – 2015-05-18 (×2): 2 [IU] via SUBCUTANEOUS
  Administered 2015-05-18: 3 [IU] via SUBCUTANEOUS
  Administered 2015-05-18: 11 [IU] via SUBCUTANEOUS
  Administered 2015-05-19: 3 [IU] via SUBCUTANEOUS
  Administered 2015-05-19: 2 [IU] via SUBCUTANEOUS
  Administered 2015-05-20: 3 [IU] via SUBCUTANEOUS
  Administered 2015-05-20: 2 [IU] via SUBCUTANEOUS
  Administered 2015-05-20 – 2015-05-21 (×2): 3 [IU] via SUBCUTANEOUS
  Administered 2015-05-21: 2 [IU] via SUBCUTANEOUS
  Administered 2015-05-21: 3 [IU] via SUBCUTANEOUS
  Administered 2015-05-22 – 2015-05-23 (×4): 2 [IU] via SUBCUTANEOUS
  Administered 2015-05-23: 3 [IU] via SUBCUTANEOUS
  Administered 2015-05-24: 2 [IU] via SUBCUTANEOUS
  Administered 2015-05-24 (×2): 3 [IU] via SUBCUTANEOUS

## 2015-05-14 MED ORDER — ONDANSETRON HCL 4 MG/2ML IJ SOLN
INTRAMUSCULAR | Status: AC
  Administered 2015-05-14: 4 mg
  Filled 2015-05-14: qty 2

## 2015-05-14 MED ORDER — ACETAMINOPHEN 650 MG RE SUPP: 650.0000 mg | Freq: Four times a day (QID) | RECTAL | Status: DC | PRN

## 2015-05-14 MED ORDER — HYDROCODONE-ACETAMINOPHEN 5-325 MG PO TABS
1.0000 | ORAL_TABLET | Freq: Four times a day (QID) | ORAL | Status: DC | PRN
  Administered 2015-05-15 – 2015-05-24 (×12): 1 via ORAL
  Filled 2015-05-14 (×12): qty 1

## 2015-05-14 MED ORDER — DEXTROSE 50 % IV SOLN
1.0000 | Freq: Once | INTRAVENOUS | Status: AC
  Administered 2015-05-14: 50 mL via INTRAVENOUS
  Filled 2015-05-14 (×2): qty 50

## 2015-05-14 MED ORDER — APIXABAN 2.5 MG PO TABS
2.5000 mg | ORAL_TABLET | Freq: Two times a day (BID) | ORAL | Status: DC
  Administered 2015-05-14 – 2015-05-17 (×6): 2.5 mg via ORAL
  Filled 2015-05-14 (×6): qty 1

## 2015-05-14 MED ORDER — CARVEDILOL 12.5 MG PO TABS
12.5000 mg | ORAL_TABLET | Freq: Two times a day (BID) | ORAL | Status: DC
  Administered 2015-05-14 – 2015-05-24 (×20): 12.5 mg via ORAL
  Filled 2015-05-14 (×22): qty 1

## 2015-05-14 MED ORDER — SODIUM POLYSTYRENE SULFONATE 15 GM/60ML PO SUSP
60.0000 g | Freq: Once | ORAL | Status: AC
  Administered 2015-05-14: 60 g via ORAL
  Filled 2015-05-14: qty 240

## 2015-05-14 MED ORDER — LORAZEPAM 2 MG/ML IJ SOLN
1.0000 mg | Freq: Four times a day (QID) | INTRAMUSCULAR | Status: DC | PRN
Start: 2015-05-14 — End: 2015-05-23

## 2015-05-14 MED ORDER — DEXTROSE 50 % IV SOLN
INTRAVENOUS | Status: AC
  Filled 2015-05-14: qty 50

## 2015-05-14 MED ORDER — ONDANSETRON HCL 4 MG PO TABS: 4.0000 mg | ORAL_TABLET | Freq: Four times a day (QID) | ORAL | Status: DC | PRN

## 2015-05-14 MED ORDER — INSULIN ASPART 100 UNIT/ML ~~LOC~~ SOLN
0.0000 [IU] | Freq: Every day | SUBCUTANEOUS | Status: DC
  Administered 2015-05-15 – 2015-05-22 (×3): 2 [IU] via SUBCUTANEOUS

## 2015-05-14 MED ORDER — SODIUM CHLORIDE 0.9 % IV SOLN
INTRAVENOUS | Status: DC
  Administered 2015-05-14: 125 mL/h via INTRAVENOUS

## 2015-05-14 MED ORDER — SODIUM CHLORIDE 0.9 % IJ SOLN
3.0000 mL | Freq: Two times a day (BID) | INTRAMUSCULAR | Status: DC
  Administered 2015-05-15 – 2015-05-23 (×12): 3 mL via INTRAVENOUS

## 2015-05-14 NOTE — Progress Notes (Signed)
CM notified [name] of [name] of pt admission to [facility] to be followed for any d/c needs Wife confirms pt went to snf for 20 days and is now at home Reports [name] just started visiting this week [facility]  CM [place] [facility] [place] [facility] contact information and a list of PDNs (Private duty nursing)

## 2015-05-14 NOTE — ED Notes (Signed)
Lab called and reported K+level 7.3. Dr. Lynelle DoctorKnapp aware.

## 2015-05-14 NOTE — Clinical Social Work Note (Signed)
Clinical Social Work Assessment  Patient Details  Name: Justin Ervine Sr. MRN: [id number] Date of Birth: [birth year]  Date of referral:  05/14/15               Reason for consult:  Other (Comment Required) (Consulted by EDP)                Permission sought to share information with:   (None.) Permission granted to share information::  No  Name::        Agency::     Relationship::     Contact Information:     Housing/Transportation Living arrangements for the past 2 months:  Single Family Home (Patient lives at home with his wife in [place].) Source of Information:  Spouse (Wife/ [name] [phone]) Patient Interpreter Needed:  None Criminal Activity/Legal Involvement Pertinent to Current Situation/Hospitalization:  No - Comment as needed Significant Relationships:  Spouse Lives with:  Spouse Do you feel safe going back to the place where you live?  Yes (Wife informed CSW that she is not interested in facility. She states she would like the patient to return home.) Need for family participation in patient care:  Yes (Comment) (Wife is currently a great support for patient. She was present at bedside.)  Care giving concerns:  There are no care giving concerns at this time. Wife states that she is with patient 24/7. Also, wife states that the patient now has home health with [place].   Social Worker assessment / plan: CSW met with patient at bedside. Wife was present.  Wife confirms that patient presents to [facility] due to foot pain. Wife informed CSW that the patient is from home, and lives with her in [place]. Wife states that she is with patient 24/7 and that the patient now receives [place] for [facility]. According to wife, the home health agency started Friday.  Wife informed CSW that the patient is bedridden, and has been so for the past 18 to 19 months. Wife states that the patient L leg has been amputated. She also states that the patient receives assistance with  completing all ADL's. She states that the patient has not had any falling incidents.  Wife informed CSW that she is the patient's primary support. She states that she is not interested in a facility for patient. She states upon discharge she would like the patient to return home with her.  Wife says that the patient came from [facility] on November 19. She states that she believes the patient's foot got worse while he was at the facility.  Employment status:  Retired Forensic scientist:   Production designer, theatre/television/film.) PT Recommendations:  Not assessed at this time Information / Referral to community resources:   (Wife states that patient is set up with home health through [facility]. She states that the agency started last Friday. Wife states that she is not interested in facility.)  Patient/Family's Response to care:  Patient and family are aware that he will be admitted. They are accepting at this time and do not have any questions for CSW.  Patient/Family's Understanding of and Emotional Response to Diagnosis, Current Treatment, and Prognosis:  Patient and wife are understanding at this time.  Emotional Assessment Appearance:  Appears older than stated age Attitude/Demeanor/Rapport:   (Appropriate.) Affect (typically observed):  Accepting, Appropriate Orientation:  Oriented to Self, Oriented to Place, Oriented to  Time, Oriented to Situation Alcohol / Substance use:  Not Applicable Psych involvement (Current and /or in  the community):  No (Comment)  Discharge Needs  Concerns to be addressed:  Adjustment to Illness Readmission within the last 30 days:  No Current discharge risk:  None Barriers to Discharge:  No Barriers Identified   Bernita Buffy, LCSW 05/14/2015, 3:41 PM

## 2015-05-14 NOTE — ED Notes (Signed)
NURSE IS TRYING TO PULL LABS FROM THE IV

## 2015-05-14 NOTE — ED Notes (Signed)
Awake. Verbally responsive. A/O x4. Resp even and unlabored. No audible adventitious breath sounds noted. ABC's intact. SR on monitor. IV infusing NS at 125ml/hr without difficulty. Family at bedside. 

## 2015-05-14 NOTE — Progress Notes (Signed)
CSW is unable to assist with accessing meds for discharge. RNCM also consulted for assistance. ED CSW met with pt/spouse prior to transfer to floor. Pt plans to return home with [facility] service at d/c. RNCM will assist with d/c planning needs.  Werner Lean LCSW [phone]

## 2015-05-14 NOTE — ED Notes (Signed)
Bed: [id number] Expected date:  Expected time:  Means of arrival:  Comments: EMS-foot wound

## 2015-05-14 NOTE — ED Notes (Addendum)
Pt arrived via EMS with report of rt foot infection, fever, increase in weakness and constipation. Pt has 5cmx4cmx0cm wound to rt foot on rt lateral with foul odor, serous drainage from wound. Noted discoloration to rt 4th and 5th toe. Unable to palpate pedal pulse or unable to find pedal pulse with doppler.

## 2015-05-14 NOTE — ED Notes (Signed)
Awake. Verbally responsive. A/O x4. Resp even and unlabored. No audible adventitious breath sounds noted. ABC's intact. SR on monitor. IV saline lock patent and intact. Family at bedside. 

## 2015-05-14 NOTE — H&P (Signed)
[facility] History and Physical  Emmanuelle Coxe Sr. ZOX:[id number] DOB: May 05, 1946 DOA: 05/14/2015  Referring physician: ER physician: Dr. Linwood Dibbles PCP: [name], MD  Chief Complaint: leg redness, pain, right 4th toe wound   HPI:  69 year old male with past medical history of hypertension, CKD stage 4 (baseline Cr 2.4), left BKA, history of pulmonary embolism (on anticoagulation with apixaban) diabetes mellitus on insulin, recently hospitalized for acute encephalopathy thought to be due to acute infection, UTI. He presented to [facility] ED with worsening mental status changes per his wife who provided most of the history of present illness. Mental status changes started about 1-2 days prior to this admission. In addition, he had worsening wound on his right 4th toe over past few days and home nurse was concerned about possible infection. Wife also said his right leg was more red and swollen in past few days. No fevers at home. No other complaints such as abdominal pain, nausea or vomiting. No chest pain, shortness of breath or palpitations. No blood in stool or urine. No diarrhea or constipation. No lightheadedness or falls.  In ED, pt was hemodynamically stable. Blood work was notable for potassium of 7.3 (he was given calcium gluconate, insulin, albuterol), CO2 of 16, Cr 2.6 (baseline 2.4), WBC count 10.8, Hemoglobin 9.9. His 4th right toe looked infected, open wound but no active drainage. X ray showed no acute osseous findings. He was started on clindamycin per cellulitis order set. Because of severe metabolic derangement he was admitted to SDU. Renal has seen him in consultation.   Assessment & Plan    Principal Problem:   Cellulitis of right foot, 4th toe / Leukocytosis - Right foot 4th toe looks infected. Cellulitis order set placed. Choice of abx clindamycin for now (did not place vanco because of renal failure adn because per order set classification - non purulent moderate disease  clinda is one of the choices) - May need ortho eval if no improvement - WOC consulted   Active Problems:   Acute encephalopathy - Likely from acute infection, acute on chronic renal failure - Monitor mental status  - Will get PT eval in am     S/P BKA (below knee amputation) unilateral (HCC) - Stable     History of pulmonary embolism - Continue apixaban     Acute renal failure superimposed on stage 4 chronic kidney disease (HCC) / Metabolic acidosis - Baseline Cr 2.4 - Cr on this admission 2.3, likely due to lasix - Lasix placed on hold - Renal consulted, started sodium bicarb infusion     Hyperkalemia - Due to acute on chronic renal failure as well as potassium supplementation  - Given insulin, calcium gluconate and albuterol in ED - Repeat admission labs     Uncontrolled diabetes mellitus with circulatory complication, with long-term current use of insulin (HCC) / Diabetic peripheral neuropathy (HCC) - A1c in 2015 was 8 - Check A1c on this admission - On SSI at home. Order placed for SSI.     Anemia of chronic renal failure, stage 4 (severe) (HCC) - Hemoglobin stable - No current indications for transfusion    Benign essential HTN - Continue Norvasc, carvedilol and clonidine    Chronic combined systolic and diastolic CHF (congestive heart failure) (HCC)  - Stable, compensated - Last 2 D ECHO in 03/2013 with EF 50% grade 1 diastolic dysfunction - Lasix on hold due to worsening renal function   DVT prophylaxis:  - SCD"s bilaterally   Radiological  Exams on Admission: Dg Foot Complete Right 05/14/2015  1. No plain radiographic evidence of osteomyelitis at this time. No subcutaneous gas. 2. Progressed calcified peripheral vascular disease since 2013. Electronically Signed   By: Odessa Fleming M.D.   On: 05/14/2015 12:30    EKG: I have personally reviewed EKG. EKG shows sinus rhythm   Code Status: DNR/DNI Family Communication: Plan of care discussed with the patient   Disposition Plan: Admit for further evaluation, SDU because of high potassium   [name], MD  [facility] Pager [phone]  Time spent in minutes: 75 minutes  Review of Systems:  Constitutional: Negative for fever, chills and malaise/fatigue. Negative for diaphoresis.  HENT: Negative for hearing loss, ear pain, nosebleeds, congestion, sore throat, neck pain, tinnitus and ear discharge.   Eyes: Negative for blurred vision, double vision, photophobia, pain, discharge and redness.  Respiratory: Negative for cough, hemoptysis, sputum production, shortness of breath, wheezing and stridor.   Cardiovascular: Negative for chest pain, palpitations, orthopnea, claudication and leg swelling.  Gastrointestinal: Negative for nausea, vomiting and abdominal pain. Negative for heartburn, constipation, blood in stool and melena.  Genitourinary: Negative for dysuria, urgency, frequency, hematuria and flank pain.  Musculoskeletal: Negative for myalgias, back pain, joint pain and falls.  Skin: Negative for itching and rash otherwise per HPI.  Neurological: Negative for dizziness and weakness. Negative for tingling, tremors, sensory change, speech change, focal weakness, loss of consciousness and headaches.  Endo/Heme/Allergies: Negative for environmental allergies and polydipsia. Does not bruise/bleed easily.  Psychiatric/Behavioral: Negative for suicidal ideas. The patient is not nervous/anxious.      History reviewed. No pertinent past medical history. Past Surgical History  Procedure Laterality Date  . Right leg fracture    . Skin graft    . Knee surgery    . Tee without cardioversion  03/17/2012    Procedure: TRANSESOPHAGEAL ECHOCARDIOGRAM (TEE);  Surgeon: Ricki Rodriguez, MD;  Location: [facility] ENDOSCOPY;  Service: Cardiovascular;  Laterality: N/A;  . Incision and drainage Left 07/02/2013    Procedure: INCISION AND DRAINAGE LEFT FOOT;  Surgeon: Kathryne Hitch, MD;  Location: [facility] ORS;   Service: Orthopedics;  Laterality: Left;  . Amputation Left 07/18/2013    Procedure: AMPUTATION BELOW KNEE;  Surgeon: Kathryne Hitch, MD;  Location: [facility] OR;  Service: Orthopedics;  Laterality: Left;   Social History:  reports that he quit smoking about 31 years ago. He has never used smokeless tobacco. He reports that he does not drink alcohol or use illicit drugs.  Allergies  Allergen Reactions  . Ancef [Cefazolin] Itching and Rash  . Sulfa Antibiotics Itching and Rash    Family History:  Family History  Problem Relation Age of Onset  . Heart attack Father   . Heart attack Mother   . Renal Disease Sister     one of his sisters needed hemodialysis  . Diabetes Brother      Prior to Admission medications   Medication Sig Start Date End Date Taking? Authorizing Provider  amLODipine (NORVASC) 5 MG tablet Take 5 mg by mouth daily.   Yes Historical Provider, MD  apixaban (ELIQUIS) 2.5 MG TABS tablet Take 2.5 mg by mouth 2 (two) times daily.   Yes Historical Provider, MD  bisacodyl (BISACODYL) 5 MG EC tablet Take 5 mg by mouth daily as needed for mild constipation or moderate constipation.   Yes Historical Provider, MD  carvedilol (COREG) 12.5 MG tablet Take 1 tablet (12.5 mg total) by mouth 2 (two) times daily  with a meal. 03/14/13  Yes Rinaldo Cloud, MD  cloNIDine (CATAPRES) 0.1 MG tablet Take 0.1 mg by mouth 3 (three) times daily.    Yes Historical Provider, MD  furosemide (LASIX) 40 MG tablet Take 40 mg by mouth daily.   Yes Historical Provider, MD  HYDROcodone-acetaminophen (NORCO/VICODIN) 5-325 MG tablet Take 1 tablet by mouth every 6 (six) hours as needed for moderate pain.   Yes Historical Provider, MD  hydrOXYzine (ATARAX/VISTARIL) 10 MG tablet Take 10 mg by mouth 3 (three) times daily as needed for itching.   Yes Historical Provider, MD  insulin aspart (NOVOLOG) 100 UNIT/ML injection Inject into the skin 3 (three) times daily with meals. SSI 200-300   7 UNITS; 301-400  9  UNITS; 401-500   12  UNITS; > 500 14  UNITS AND CALL MD   CBG AC AND QHS 07/04/13  Yes Alison Murray, MD  liver oil-zinc oxide (DESITIN) 40 % ointment Apply topically 2 (two) times daily at 10 am and 4 pm. Patient taking differently: Apply 1 application topically daily.  04/09/15  Yes Kathlen Mody, MD  potassium chloride (K-DUR) 10 MEQ tablet Take 10 mEq by mouth 2 (two) times daily.   Yes Historical Provider, MD  Tamsulosin HCl (FLOMAX) 0.4 MG CAPS Take 1 capsule (0.4 mg total) by mouth daily. 04/01/12  Yes Rinaldo Cloud, MD  traMADol (ULTRAM) 50 MG tablet Take 1 tablet (50 mg total) by mouth every 6 (six) hours as needed for moderate pain. 04/08/15  Yes Kathlen Mody, MD   Physical Exam: Filed Vitals:   05/14/15 1430 05/14/15 1500 05/14/15 1506 05/14/15 1604  BP: 138/65 147/64    Pulse: 77 79    Temp:    99.3 F (37.4 C)  TempSrc:    Rectal  Resp: 17 15    SpO2: 100% 100% 98%     Physical Exam  Constitutional: Appears well-developed and well-nourished. No distress.  HENT: Normocephalic. No tonsillar erythema or exudates Eyes: Conjunctivae are normal. No scleral icterus.  Neck: Normal ROM. Neck supple. No JVD. No tracheal deviation. No thyromegaly.  CVS: Rate controlled, S1/S2 +, appreciate slight murmur in mid sternal area Pulmonary: Effort and breath sounds normal, no stridor, rhonchi, wheezes, rales.  Abdominal: Soft. BS +,  no distension, tenderness, rebound or guarding.  Musculoskeletal: Left BKA, RLE with open wound of 4th toe with erythema very slight in RLE Lymphadenopathy: No lymphadenopathy noted, cervical, inguinal. Neuro: Alert. Normal reflexes, muscle tone coordination. No focal neurologic deficits. Skin: Skin is warm and dry. RLE area of wound open 4th toe slight erythema of RLE  Psychiatric: Normal mood and affect. Behavior, judgment, thought content normal.   Labs on Admission:  Basic Metabolic Panel:  Recent Labs Lab 05/14/15 1322  NA 140  K 7.3*  CL 120*  CO2  16*  GLUCOSE 97  BUN 51*  CREATININE 2.60*  CALCIUM 8.6*   Liver Function Tests: No results for input(s): AST, ALT, ALKPHOS, BILITOT, PROT, ALBUMIN in the last 168 hours. No results for input(s): LIPASE, AMYLASE in the last 168 hours. No results for input(s): AMMONIA in the last 168 hours. CBC:  Recent Labs Lab 05/14/15 1155  WBC 10.8*  NEUTROABS 8.3*  HGB 9.9*  HCT 30.2*  MCV 83.0  PLT 338   Cardiac Enzymes: No results for input(s): CKTOTAL, CKMB, CKMBINDEX, TROPONINI in the last 168 hours. BNP: Invalid input(s): POCBNP CBG:  Recent Labs Lab 05/14/15 1151 05/14/15 1542  GLUCAP 104* 136*  If 7PM-7AM, please contact night-coverage [email or web address] Password [email or web address] 05/14/2015, 4:53 PM

## 2015-05-14 NOTE — ED Provider Notes (Addendum)
CSN: [id number]     Arrival date & time 05/14/15  1058 History   First MD Initiated Contact with Patient 05/14/15 1123     Chief Complaint  Patient presents with  . Foot Pain   history obtained from the patient as well as an EMS report HPI Patient presents to the emergency room with complaints of increasing pain and a sore on his right foot. Patient has history of diabetes and previous osteomyelitis of the left foot. Patient has noticed increasing soreness over the last several days. There has been drainage as well as discoloration around the fourth and fifth toe of his right foot. Patient denies any trouble with fevers or chills. No nausea or vomiting.   Past Medical History  Diagnosis Date  . Diabetes mellitus   . Hypertension   . Hyperlipidemia   . CHF (congestive heart failure) (HCC)   . Enlarged heart   . Pneumonia     hx of   . Osteomyelitis of foot (HCC)     LT  . CKD (chronic kidney disease), stage IV (HCC)   . Shortness of breath   . GERD (gastroesophageal reflux disease)    Past Surgical History  Procedure Laterality Date  . Right leg fracture    . Skin graft    . Knee surgery    . Tee without cardioversion  03/17/2012    Procedure: TRANSESOPHAGEAL ECHOCARDIOGRAM (TEE);  Surgeon: Ricki Rodriguez, MD;  Location: [facility] ENDOSCOPY;  Service: Cardiovascular;  Laterality: N/A;  . Incision and drainage Left 07/02/2013    Procedure: INCISION AND DRAINAGE LEFT FOOT;  Surgeon: Kathryne Hitch, MD;  Location: [facility] ORS;  Service: Orthopedics;  Laterality: Left;  . Amputation Left 07/18/2013    Procedure: AMPUTATION BELOW KNEE;  Surgeon: Kathryne Hitch, MD;  Location: [facility] OR;  Service: Orthopedics;  Laterality: Left;   Family History  Problem Relation Age of Onset  . Heart attack Father   . Heart attack Mother   . Renal Disease Sister     one of his sisters needed hemodialysis  . Diabetes Brother    Social History  Substance Use Topics  . Smoking status: Former Smoker     Quit date: 03/10/1984  . Smokeless tobacco: Never Used  . Alcohol Use: No    Review of Systems  Constitutional: Negative for fever.  Gastrointestinal: Positive for constipation. Negative for abdominal pain.  Genitourinary: Negative for dysuria.  All other systems reviewed and are negative.     Allergies  Ancef and Sulfa antibiotics  Home Medications   Prior to Admission medications   Medication Sig Start Date End Date Taking? Authorizing Provider  amLODipine (NORVASC) 5 MG tablet Take 5 mg by mouth daily.   Yes Historical Provider, MD  apixaban (ELIQUIS) 2.5 MG TABS tablet Take 2.5 mg by mouth 2 (two) times daily.   Yes Historical Provider, MD  bisacodyl (BISACODYL) 5 MG EC tablet Take 5 mg by mouth daily as needed for mild constipation or moderate constipation.   Yes Historical Provider, MD  carvedilol (COREG) 12.5 MG tablet Take 1 tablet (12.5 mg total) by mouth 2 (two) times daily with a meal. 03/14/13  Yes Rinaldo Cloud, MD  cloNIDine (CATAPRES) 0.1 MG tablet Take 0.1 mg by mouth 3 (three) times daily.    Yes Historical Provider, MD  furosemide (LASIX) 40 MG tablet Take 40 mg by mouth daily.   Yes Historical Provider, MD  HYDROcodone-acetaminophen (NORCO/VICODIN) 5-325 MG tablet Take 1 tablet by mouth  every 6 (six) hours as needed for moderate pain.   Yes Historical Provider, MD  hydrOXYzine (ATARAX/VISTARIL) 10 MG tablet Take 10 mg by mouth 3 (three) times daily as needed for itching.   Yes Historical Provider, MD  insulin aspart (NOVOLOG) 100 UNIT/ML injection Inject into the skin 3 (three) times daily with meals. SSI 200-300   7 UNITS; 301-400  9 UNITS; 401-500   12  UNITS; > 500 14  UNITS AND CALL MD   CBG AC AND QHS 07/04/13  Yes Alison Murray, MD  liver oil-zinc oxide (DESITIN) 40 % ointment Apply topically 2 (two) times daily at 10 am and 4 pm. Patient taking differently: Apply 1 application topically daily.  04/09/15  Yes Kathlen Mody, MD  potassium chloride (K-DUR) 10  MEQ tablet Take 10 mEq by mouth 2 (two) times daily.   Yes Historical Provider, MD  Tamsulosin HCl (FLOMAX) 0.4 MG CAPS Take 1 capsule (0.4 mg total) by mouth daily. 04/01/12  Yes Rinaldo Cloud, MD  traMADol (ULTRAM) 50 MG tablet Take 1 tablet (50 mg total) by mouth every 6 (six) hours as needed for moderate pain. 04/08/15  Yes Kathlen Mody, MD   BP 155/73 mmHg  Pulse 76  Temp(Src) 98.5 F (36.9 C) (Oral)  Resp 15  SpO2 99% Physical Exam  Constitutional: No distress.  HENT:  Head: Normocephalic and atraumatic.  Right Ear: External ear normal.  Left Ear: External ear normal.  Eyes: Conjunctivae are normal. Right eye exhibits no discharge. Left eye exhibits no discharge. No scleral icterus.  Neck: Neck supple. No tracheal deviation present.  Cardiovascular: Normal rate, regular rhythm and intact distal pulses.   Pulses:      Femoral pulses are 2+ on the right side, and 2+ on the left side.      Dorsalis pedis pulses are 0 on the right side, and 1+ on the left side.  Unable to palpate dorsalis pedis pulse on the right foot, palpable femoral pulses  Pulmonary/Chest: Effort normal and breath sounds normal. No stridor. No respiratory distress. He has no wheezes. He has no rales.  Abdominal: Soft. Bowel sounds are normal. He exhibits no distension. There is no tenderness. There is no rebound and no guarding.  Genitourinary:  Chronic indwelling Foley catheter  Musculoskeletal: He exhibits no edema or tenderness.  Capillary refill is decreased in the right foot but still present approx 4 seconds,  dusky discoloration of the right lateral aspect of the foot, lesion at the lateral aspect of the right foot as well as the right fifth toe,   Neurological: He is alert. No cranial nerve deficit (no facial droop, extraocular movements intact, no slurred speech) or sensory deficit. He exhibits normal muscle tone. He displays no seizure activity. Coordination normal.  Generalized weakness  Skin: Skin is  warm and dry. No rash noted.  Psychiatric: He has a normal mood and affect.  Nursing note and vitals reviewed.   ED Course  Procedures  CRITICAL CARE Performed by: ZOXWR,UEA Total critical care time: 35  minutes Critical care time was exclusive of separately billable procedures and treating other patients. Critical care was necessary to treat or prevent imminent or life-threatening deterioration. Critical care was time spent personally by me on the following activities: development of treatment plan with patient and/or surrogate as well as nursing, discussions with consultants, evaluation of patient's response to treatment, examination of patient, obtaining history from patient or surrogate, ordering and performing treatments and interventions, ordering and review of  laboratory studies, ordering and review of radiographic studies, pulse oximetry and re-evaluation of patient's condition.  Labs Review Labs Reviewed  CBC WITH DIFFERENTIAL/PLATELET - Abnormal; Notable for the following:    WBC 10.8 (*)    RBC 3.64 (*)    Hemoglobin 9.9 (*)    HCT 30.2 (*)    Neutro Abs 8.3 (*)    All other components within normal limits  BASIC METABOLIC PANEL - Abnormal; Notable for the following:    Potassium 7.3 (*)    Chloride 120 (*)    CO2 16 (*)    BUN 51 (*)    Creatinine, Ser 2.60 (*)    Calcium 8.6 (*)    GFR calc non Af Amer 23 (*)    GFR calc Af Amer 27 (*)    Anion gap 4 (*)    All other components within normal limits  CBG MONITORING, ED - Abnormal; Notable for the following:    Glucose-Capillary 104 (*)    All other components within normal limits  CULTURE, BLOOD (ROUTINE X 2)  CULTURE, BLOOD (ROUTINE X 2)  CULTURE, BLOOD (ROUTINE X 2)  CULTURE, BLOOD (ROUTINE X 2)  POTASSIUM  SEDIMENTATION RATE  C-REACTIVE PROTEIN  PREALBUMIN  HEMOGLOBIN A1C  I-STAT CG4 LACTIC ACID, ED  I-STAT CG4 LACTIC ACID, ED    Imaging Review Dg Foot Complete Right  05/14/2015  CLINICAL DATA:   69 year old male with foot infection. Open wound at the fourth and fifth toes. Diabetes. Pain. Initial encounter. EXAM: RIGHT FOOT COMPLETE - 3+ VIEW COMPARISON:  02/10/2012. FINDINGS: Chronic but progressed demineralization in the distal metatarsals and phalanges. Widespread calcified peripheral vascular disease has progressed since 2013. No subcutaneous gas. Discrete soft tissue wounds are not evident. No left foot fracture, dislocation, or cortical osteolysis. Chronic degenerative changes at the first MTP joint. Chronic posttraumatic changes to the distal tibia and fibula with partially visible intra medullary rod. IMPRESSION: 1. No plain radiographic evidence of osteomyelitis at this time. No subcutaneous gas. 2. Progressed calcified peripheral vascular disease since 2013. Electronically Signed   By: Odessa Fleming M.D.   On: 05/14/2015 12:30   I have personally reviewed and evaluated these images and lab results as part of my medical decision-making.   EKG Interpretation   Date/Time:  Tuesday May 14 2015 11:25:24 EST Ventricular Rate:  75 PR Interval:  152 QRS Duration: 102 QT Interval:  369 QTC Calculation: 412 R Axis:   21 Text Interpretation:  Sinus rhythm Abnormal R-wave progression, early  transition Abnormal T, consider ischemia, diffuse leads Minimal ST  elevation, anterior leads No significant change since last tracing  Confirmed by Anisa Leanos  MD-J, Birgitta Uhlir ([id number]) on 05/14/2015 2:51:49 PM     Medications  0.9 %  sodium chloride infusion (125 mL/hr Intravenous New Bag/Given 05/14/15 1219)  calcium gluconate 1 g in sodium chloride 0.9 % 100 mL IVPB (not administered)  insulin aspart (novoLOG) injection 10 Units (not administered)  dextrose 50 % solution 50 mL (not administered)  clindamycin (CLEOCIN) IVPB 600 mg (not administered)  insulin aspart (novoLOG) injection 0-15 Units (not administered)  insulin aspart (novoLOG) injection 0-5 Units (not administered)  LORazepam (ATIVAN) injection  1 mg (not administered)  albuterol (PROVENTIL,VENTOLIN) solution continuous neb (not administered)  morphine 4 MG/ML injection 4 mg (not administered)    MDM   Final diagnoses:  Cellulitis of right lower extremity  Renal failure  Hyperkalemia  Peripheral vascular disease (HCC)    1418  the patient's laboratory tests  show acute renal failure with a metabolic acidosis and hyperkalemia The patient has chronic renal insufficiency. Baseline creatinine was 2.09, 1 month ago. His bicarbonate has been chronically low but worse today.  The hyperkalemia is new. Plan on treatment with insulin and glucose, calcium gluconate, and albuterol breathing treatment.  EKG and repeat potassium ordered  Patient's exam is suggestive of a cellulitis associated with peripheral vascular disease and poor perfusion. No evidence of osteomyelitis or subcutaneous gas on x-ray.  I discussed the findings with the wife.  Pt has been on a potassium supplement.  This may be the cause of the increased potassium associated with his renal insufficiency.  I will consult the medical service for admission for IV antibiotics and further treatment. Consult with neurology regarding the renal failure.   Linwood DibblesJon Blannie Shedlock, MD 05/14/15 1453  Pt's had an episode of bradycardia while Dr Hyman HopesWebb was at the bedside.  Pt was nauseated at the time.  ? Vasovagal.  BP and heart rate are normal now.  Will give dose of bicarb considering his acidosis and hyperkalemia.  Repeat level is still pending.  Plan on admission to stepdown unit.  Linwood DibblesJon Abdulrahim Siddiqi, MD 05/14/15 [phone]

## 2015-05-14 NOTE — ED Notes (Addendum)
Pt had episode of nausea, diaphoresis, bradycardia and less responsiveness lasting approx. 2 mins. Nephrology and EDP at bedside at time of incident. Glucose, BP and O2 stable during episode. ICU Charge RN and hospitalist were made aware. Also, main lab was called for a recollect of potassium that had hemolyzed.

## 2015-05-14 NOTE — Progress Notes (Signed)
Referring Provider: No ref. provider found Primary Care Physician:  [name], MD Primary Nephrologist:  None  Reason for Consultation:   Hyperkalemia  Metabolic acidosis and acute on chronic renal failure  HPI:  This is a delightful man with a complex medical history. He was in [facility] until the middle of November, having been admitted earlier for a urinary tract infection. He had an early stage sacral decubitus and an ischemic appearing right foot with digital gangrene. He has been intermittently confused since coming home and there has been a odor from a gangrenous 4 th toe. He has been nauseated and vomited on admission with a low grade fever. He has a condom catheter in place and according to wife has not been eating for several days  History pertinent for diabetes type 2 and congestive heart failure and osteomyelitis  He has stage 4 chronic renal disease -  Baseline creatinine 2  No NSAIDS  No FH of renal disease  No tobacco use      Past Medical History  Diagnosis Date  . Diabetes mellitus   . Hypertension   . Hyperlipidemia   . CHF (congestive heart failure) (HCC)   . Enlarged heart   . Pneumonia     hx of   . Osteomyelitis of foot (HCC)     LT  . CKD (chronic kidney disease), stage IV (HCC)   . Shortness of breath   . GERD (gastroesophageal reflux disease)     Past Surgical History  Procedure Laterality Date  . Right leg fracture    . Skin graft    . Knee surgery    . Tee without cardioversion  03/17/2012    Procedure: TRANSESOPHAGEAL ECHOCARDIOGRAM (TEE);  Surgeon: Ricki Rodriguez, MD;  Location: [facility] ENDOSCOPY;  Service: Cardiovascular;  Laterality: N/A;  . Incision and drainage Left 07/02/2013    Procedure: INCISION AND DRAINAGE LEFT FOOT;  Surgeon: Kathryne Hitch, MD;  Location: [facility] ORS;  Service: Orthopedics;  Laterality: Left;  . Amputation Left 07/18/2013    Procedure: AMPUTATION BELOW KNEE;  Surgeon: Kathryne Hitch, MD;  Location: [facility]  OR;  Service: Orthopedics;  Laterality: Left;    Prior to Admission medications   Medication Sig Start Date End Date Taking? Authorizing Provider  amLODipine (NORVASC) 5 MG tablet Take 5 mg by mouth daily.   Yes Historical Provider, MD  apixaban (ELIQUIS) 2.5 MG TABS tablet Take 2.5 mg by mouth 2 (two) times daily.   Yes Historical Provider, MD  bisacodyl (BISACODYL) 5 MG EC tablet Take 5 mg by mouth daily as needed for mild constipation or moderate constipation.   Yes Historical Provider, MD  carvedilol (COREG) 12.5 MG tablet Take 1 tablet (12.5 mg total) by mouth 2 (two) times daily with a meal. 03/14/13  Yes Rinaldo Cloud, MD  cloNIDine (CATAPRES) 0.1 MG tablet Take 0.1 mg by mouth 3 (three) times daily.    Yes Historical Provider, MD  furosemide (LASIX) 40 MG tablet Take 40 mg by mouth daily.   Yes Historical Provider, MD  HYDROcodone-acetaminophen (NORCO/VICODIN) 5-325 MG tablet Take 1 tablet by mouth every 6 (six) hours as needed for moderate pain.   Yes Historical Provider, MD  hydrOXYzine (ATARAX/VISTARIL) 10 MG tablet Take 10 mg by mouth 3 (three) times daily as needed for itching.   Yes Historical Provider, MD  insulin aspart (NOVOLOG) 100 UNIT/ML injection Inject into the skin 3 (three) times daily with meals. SSI 200-300   7 UNITS; 301-400  9 UNITS; 401-500   12  UNITS; > 500 14  UNITS AND CALL MD   CBG AC AND QHS 07/04/13  Yes Alison MurrayAlma M Devine, MD  liver oil-zinc oxide (DESITIN) 40 % ointment Apply topically 2 (two) times daily at 10 am and 4 pm. Patient taking differently: Apply 1 application topically daily.  04/09/15  Yes Kathlen ModyVijaya Akula, MD  potassium chloride (K-DUR) 10 MEQ tablet Take 10 mEq by mouth 2 (two) times daily.   Yes Historical Provider, MD  Tamsulosin HCl (FLOMAX) 0.4 MG CAPS Take 1 capsule (0.4 mg total) by mouth daily. 04/01/12  Yes Rinaldo CloudMohan Harwani, MD  traMADol (ULTRAM) 50 MG tablet Take 1 tablet (50 mg total) by mouth every 6 (six) hours as needed for moderate pain. 04/08/15   Yes Kathlen ModyVijaya Akula, MD    Current Facility-Administered Medications  Medication Dose Route Frequency Provider Last Rate Last Dose  . 0.9 %  sodium chloride infusion   Intravenous Continuous Linwood DibblesJon Knapp, MD 125 mL/hr at 05/14/15 1219 125 mL/hr at 05/14/15 1219  . atropine 0.1 MG/ML injection           . clindamycin (CLEOCIN) IVPB 600 mg  600 mg Intravenous Once Linwood DibblesJon Knapp, MD      . insulin aspart (novoLOG) injection 0-15 Units  0-15 Units Subcutaneous TID WC Alison MurrayAlma M Devine, MD      . insulin aspart (novoLOG) injection 0-5 Units  0-5 Units Subcutaneous QHS Alison MurrayAlma M Devine, MD      . LORazepam (ATIVAN) injection 1 mg  1 mg Intravenous Q6H PRN Alison MurrayAlma M Devine, MD      . morphine 4 MG/ML injection 4 mg  4 mg Intravenous Q30 min PRN Linwood DibblesJon Knapp, MD       Current Outpatient Prescriptions  Medication Sig Dispense Refill  . amLODipine (NORVASC) 5 MG tablet Take 5 mg by mouth daily.    Marland Kitchen. apixaban (ELIQUIS) 2.5 MG TABS tablet Take 2.5 mg by mouth 2 (two) times daily.    . bisacodyl (BISACODYL) 5 MG EC tablet Take 5 mg by mouth daily as needed for mild constipation or moderate constipation.    . carvedilol (COREG) 12.5 MG tablet Take 1 tablet (12.5 mg total) by mouth 2 (two) times daily with a meal. 60 tablet 3  . cloNIDine (CATAPRES) 0.1 MG tablet Take 0.1 mg by mouth 3 (three) times daily.     . furosemide (LASIX) 40 MG tablet Take 40 mg by mouth daily.    Marland Kitchen. HYDROcodone-acetaminophen (NORCO/VICODIN) 5-325 MG tablet Take 1 tablet by mouth every 6 (six) hours as needed for moderate pain.    . hydrOXYzine (ATARAX/VISTARIL) 10 MG tablet Take 10 mg by mouth 3 (three) times daily as needed for itching.    . insulin aspart (NOVOLOG) 100 UNIT/ML injection Inject into the skin 3 (three) times daily with meals. SSI 200-300   7 UNITS; 301-400  9 UNITS; 401-500   12  UNITS; > 500 14  UNITS AND CALL MD   CBG AC AND QHS    . liver oil-zinc oxide (DESITIN) 40 % ointment Apply topically 2 (two) times daily at 10 am and 4 pm.  (Patient taking differently: Apply 1 application topically daily. ) 56.7 g 0  . potassium chloride (K-DUR) 10 MEQ tablet Take 10 mEq by mouth 2 (two) times daily.    . Tamsulosin HCl (FLOMAX) 0.4 MG CAPS Take 1 capsule (0.4 mg total) by mouth daily. 30 capsule 3  . traMADol (ULTRAM) 50 MG  tablet Take 1 tablet (50 mg total) by mouth every 6 (six) hours as needed for moderate pain. 20 tablet 0    Allergies as of 05/14/2015 - Review Complete 05/14/2015  Allergen Reaction Noted  . Ancef [cefazolin] Itching and Rash 03/28/2012  . Sulfa antibiotics Itching and Rash 03/06/2013    Family History  Problem Relation Age of Onset  . Heart attack Father   . Heart attack Mother   . Renal Disease Sister     one of his sisters needed hemodialysis  . Diabetes Brother     Social History   Social History  . Marital Status: Married    Spouse Name: N/A  . Number of Children: N/A  . Years of Education: N/A   Occupational History  . Not on file.   Social History Main Topics  . Smoking status: Former Smoker    Quit date: 03/10/1984  . Smokeless tobacco: Never Used  . Alcohol Use: No  . Drug Use: No  . Sexual Activity: Yes   Other Topics Concern  . Not on file   Social History Narrative    Review of Systems: Gen: + fever, chills, sweats, + anorexia, fatigue, weakness, malaise,  HEENT: No visual complaints, Retinopathy. Normal external appearance No Epistaxis or Sore throat. No sinusitis.   CV: Denies chest pain, angina, palpitations, syncope, orthopnea, PND, peripheral edema, and claudication. Resp: Denies dyspnea at rest, dyspnea with exercise, cough, sputum, wheezing, coughing up blood, and pleurisy. GI: Denies vomiting blood, jaundice, and fecal incontinence.   Denies dysphagia or odynophagia. Constipation  GU : Denies urinary burning, blood in urine, urinary frequency, urinary hesitancy, nocturnal urination, and urinary incontinence.  No renal calculi. Condom catheter  MS: Denies  joint pain, limitation of movement, and swelling, stiffness, low back pain, extremity pain. Denies muscle weakness, cramps, atrophy.  No use of non steroidal antiinflammatory drugs. Amputated left Below knee Derm: Denies rash, itching, dry skin, hives,  ulcers on right foot  Psych: Denies depression, anxiety, memory loss, suicidal ideation, hallucinations, paranoia, and confusion. Heme: Denies bruising, bleeding, and enlarged lymph nodes. Neuro: No headache.  No diplopia. No dysarthria.  No dysphasia.  No history of CVA.  No Seizures. No paresthesias.  No weakness. Endocrine + DM.  No Thyroid disease.  No Adrenal disease.  Physical Exam: Vital signs in last 24 hours: Temp:  [98.5 F (36.9 C)-99.7 F (37.6 C)] 99.3 F (37.4 C) (12/06 1604) Pulse Rate:  [76-79] 79 (12/06 1500) Resp:  [15-21] 15 (12/06 1500) BP: (137-155)/(62-91) 147/64 mmHg (12/06 1500) SpO2:  [98 %-100 %] 98 % (12/06 1506)   General:    Ill appearing man diaphoretic  Vomiting  Head:  Normocephalic and atraumatic. Eyes:  Sclera clear, no icterus.   Conjunctiva pink. Ears:  Normal auditory acuity. Nose:  No deformity, discharge,  or lesions. Mouth:  No deformity or lesions, dentition normal. Neck:  Supple; no masses or thyromegaly. JVP not elevated Lungs:  Clear throughout to auscultation.   No wheezes, crackles, or rhonchi. No acute distress. Heart:  Regular rate and rhythm; no murmurs, clicks, rubs,  or gallops. Abdomen:  Soft, nontender and nondistended. No masses, hepatosplenomegaly or hernias noted. Normal bowel sounds, without guarding, and without rebound.  Obese  Msk:  Symmetrical without gross deformities. Normal posture. Left below knee amputation Pulses:  No carotid, renal, femoral bruits.  Extremities:  Without clubbing or edema.  Right foot gangrenous  Neurologic:  Alert and  oriented x4;  grossly normal neurologically. Skin:  Intact without significant lesions or rashes.   Intake/Output from previous  day:   Intake/Output this shift:    Lab Results:  Recent Labs  05/14/15 1155  WBC 10.8*  HGB 9.9*  HCT 30.2*  PLT 338   BMET  Recent Labs  05/14/15 1322  NA 140  K 7.3*  CL 120*  CO2 16*  GLUCOSE 97  BUN 51*  CREATININE 2.60*  CALCIUM 8.6*   LFT No results for input(s): PROT, ALBUMIN, AST, ALT, ALKPHOS, BILITOT, BILIDIR, IBILI in the last 72 hours. PT/INR No results for input(s): LABPROT, INR in the last 72 hours. Hepatitis Panel No results for input(s): HEPBSAG, HCVAB, HEPAIGM, HEPBIGM in the last 72 hours.  Studies/Results: Dg Foot Complete Right  05/14/2015  CLINICAL DATA:  69 year old male with foot infection. Open wound at the fourth and fifth toes. Diabetes. Pain. Initial encounter. EXAM: RIGHT FOOT COMPLETE - 3+ VIEW COMPARISON:  02/10/2012. FINDINGS: Chronic but progressed demineralization in the distal metatarsals and phalanges. Widespread calcified peripheral vascular disease has progressed since 2013. No subcutaneous gas. Discrete soft tissue wounds are not evident. No left foot fracture, dislocation, or cortical osteolysis. Chronic degenerative changes at the first MTP joint. Chronic posttraumatic changes to the distal tibia and fibula with partially visible intra medullary rod. IMPRESSION: 1. No plain radiographic evidence of osteomyelitis at this time. No subcutaneous gas. 2. Progressed calcified peripheral vascular disease since 2013. Electronically Signed   By: Odessa Fleming M.D.   On: 05/14/2015 12:30    Assessment/Plan:  Acute on chronic renal disease  In setting of low grade and vomiting possible volume depletion  Urine in foley bag  Condom catheter doubt obstruction              Serial electrolytes              Monitor Is and Os              Check renal U/S              Urine electrolytes               Urinalysis  Hypertension/volume appears volume depleted agree with IV fluids  Normal Saline is OK but has a metabolic acidosis and will change to IV  bicarbonate 150 mEq                Will have to monitor closely in ICU due to risk of CHF                Rate conservative at 125 cc/hr  Hyperkalemia  No  EKG changes although will proceed with treatment with insulin/ glucose and with IV bicarbonate                 Hemolyzed specimen                 Will need to recheck                   May use kayexalate                 Patient is DNR and we discussed emergency dialysis although wife prefers medical treatment at this time                 Risks of hyperkalemia explained  Metabolic Acidosis will treat with IV Bicarbonate  Chronic renal disease  Most likely diagnosis  Diabetic Nephropathy   Ischemic foot  With gangrene  -- Clindamycin excellent choice for anerobes and gram positive converage  May wish to add antibiotic for gram negatives   Patient is DNR     LOS: 0 Aaryana Betke W @TODAY @4 :07 PM

## 2015-05-14 NOTE — Progress Notes (Signed)
CRITICAL VALUE ALERT  Critical value received:  POTASSIUM 6.5  Date of notification: 05/14/2015  Time of notification: 1750  Critical value read back:Yes.    Nurse who received alert:  Josephina ShihSHANDA Verta Riedlinger, RN  MD notified (1st page):  DR Elisabeth PigeonEVINE  Time of first page: [phone]

## 2015-05-15 ENCOUNTER — Inpatient Hospital Stay (HOSPITAL_COMMUNITY): Payer: Medicare Other

## 2015-05-15 DIAGNOSIS — I739 Peripheral vascular disease, unspecified: Secondary | ICD-10-CM

## 2015-05-15 DIAGNOSIS — I70261 Atherosclerosis of native arteries of extremities with gangrene, right leg: Secondary | ICD-10-CM

## 2015-05-15 LAB — CBC
HEMATOCRIT: 25.5 % — AB (ref 39.0–52.0)
Hemoglobin: 8.4 g/dL — ABNORMAL LOW (ref 13.0–17.0)
MCH: 26.8 pg (ref 26.0–34.0)
MCHC: 32.9 g/dL (ref 30.0–36.0)
MCV: 81.5 fL (ref 78.0–100.0)
Platelets: 272 10*3/uL (ref 150–400)
RBC: 3.13 MIL/uL — ABNORMAL LOW (ref 4.22–5.81)
RDW: 13.7 % (ref 11.5–15.5)
WBC: 9.7 10*3/uL (ref 4.0–10.5)

## 2015-05-15 LAB — BASIC METABOLIC PANEL
Anion gap: 8 (ref 5–15)
BUN: 47 mg/dL — AB (ref 6–20)
CALCIUM: 8.4 mg/dL — AB (ref 8.9–10.3)
CHLORIDE: 115 mmol/L — AB (ref 101–111)
CO2: 21 mmol/L — AB (ref 22–32)
CREATININE: 2.7 mg/dL — AB (ref 0.61–1.24)
GFR calc Af Amer: 26 mL/min — ABNORMAL LOW (ref >60)
GFR calc non Af Amer: 22 mL/min — ABNORMAL LOW (ref >60)
GLUCOSE: 117 mg/dL — AB (ref 65–99)
Potassium: 5.6 mmol/L — ABNORMAL HIGH (ref 3.5–5.1)
Sodium: 144 mmol/L (ref 135–145)

## 2015-05-15 LAB — GLUCOSE, CAPILLARY
Glucose-Capillary: 107 mg/dL — ABNORMAL HIGH (ref 65–99)
Glucose-Capillary: 169 mg/dL — ABNORMAL HIGH (ref 65–99)
Glucose-Capillary: 171 mg/dL — ABNORMAL HIGH (ref 65–99)
Glucose-Capillary: 229 mg/dL — ABNORMAL HIGH (ref 65–99)

## 2015-05-15 LAB — HEMOGLOBIN A1C
HEMOGLOBIN A1C: 7.2 % — AB (ref 4.8–5.6)
Mean Plasma Glucose: 160 mg/dL

## 2015-05-15 MED ORDER — PRO-STAT SUGAR FREE PO LIQD
30.0000 mL | Freq: Every day | ORAL | Status: DC
  Administered 2015-05-15 – 2015-05-24 (×9): 30 mL via ORAL
  Filled 2015-05-15 (×11): qty 30

## 2015-05-15 MED ORDER — JUVEN PO PACK
1.0000 | PACK | Freq: Two times a day (BID) | ORAL | Status: DC
  Administered 2015-05-15: 15:00:00 via ORAL
  Administered 2015-05-16 – 2015-05-24 (×11): 1 via ORAL
  Filled 2015-05-15 (×20): qty 1

## 2015-05-15 NOTE — Progress Notes (Signed)
[facility] ROUNDING NOTE   Subjective:   Interval History: no complaints noted this morning   Objective:  Vital signs in last 24 hours:  Temp:  [97.9 F (36.6 C)-99.7 F (37.6 C)] 97.9 F (36.6 C) (12/07 1100) Pulse Rate:  [72-86] 75 (12/07 0400) Resp:  [15-20] 16 (12/07 0400) BP: (125-155)/(63-73) 155/67 mmHg (12/07 0400) SpO2:  [98 %-100 %] 100 % (12/07 0400) Weight:  [103.3 kg (227 lb 11.8 oz)] 103.3 kg (227 lb 11.8 oz) (12/07 0600)  Weight change:  Filed Weights   05/15/15 0600  Weight: 103.3 kg (227 lb 11.8 oz)    Intake/Output: I/O last 3 completed shifts: In: 1822.1 [P.O.:120; I.V.:1602.1; IV Piggyback:100] Out: 700 [Urine:300; Stool:400]   Intake/Output this shift:  Total I/O In: 800 [I.V.:750; IV Piggyback:50] Out: -   CVS- RRR RS- CTA ABD- BS present soft non-distended EXT- no edema L BKA   Basic Metabolic Panel:  Recent Labs Lab 05/14/15 1322 05/14/15 1645 05/15/15 0350  NA 140 144  144 144  K 7.3* 6.5*  6.5* 5.6*  CL 120* 120*  120* 115*  CO2 16* 19*  18* 21*  GLUCOSE 97 77  78 117*  BUN 51* 50*  51* 47*  CREATININE 2.60* 2.64*  2.56* 2.70*  CALCIUM 8.6* 8.8*  8.8* 8.4*  MG  --  2.3  --   PHOS  --  5.1*  5.0*  --     Liver Function Tests:  Recent Labs Lab 05/14/15 1645  ALBUMIN 2.5*   No results for input(s): LIPASE, AMYLASE in the last 168 hours. No results for input(s): AMMONIA in the last 168 hours.  CBC:  Recent Labs Lab 05/14/15 1155 05/14/15 1645 05/15/15 0511  WBC 10.8* 12.3* 9.7  NEUTROABS 8.3* 9.4*  --   HGB 9.9* 9.0* 8.4*  HCT 30.2* 28.3* 25.5*  MCV 83.0 84.2 81.5  PLT 338 309 272    Cardiac Enzymes: No results for input(s): CKTOTAL, CKMB, CKMBINDEX, TROPONINI in the last 168 hours.  BNP: Invalid input(s): POCBNP  CBG:  Recent Labs Lab 05/14/15 1542 05/14/15 1706 05/14/15 2127 05/15/15 0755 05/15/15 1116  GLUCAP 136* 81 144* 107* 169*    Microbiology: Results for  orders placed or performed during the hospital encounter of 05/14/15  Blood culture (routine x 2)     Status: None (Preliminary result)   Collection Time: 05/14/15 11:51 AM  Result Value Ref Range Status   Specimen Description BLOOD RIGHT ANTECUBITAL  Final   Special Requests BOTTLES DRAWN AEROBIC AND ANAEROBIC  Final   Culture   Final    NO GROWTH < 24 HOURS Performed at [facility]    Report Status PENDING  Incomplete  Blood culture (routine x 2)     Status: None (Preliminary result)   Collection Time: 05/14/15  1:23 PM  Result Value Ref Range Status   Specimen Description BLOOD LEFT HAND  Final   Special Requests BOTTLES DRAWN AEROBIC AND ANAEROBIC 5CC  Final   Culture   Final    NO GROWTH < 24 HOURS Performed at [facility]    Report Status PENDING  Incomplete  MRSA PCR Screening     Status: None   Collection Time: 05/14/15  4:26 PM  Result Value Ref Range Status   MRSA by PCR NEGATIVE NEGATIVE Final    Comment:        The GeneXpert MRSA Assay (FDA approved for NASAL specimens only), is one component of a  comprehensive MRSA colonization surveillance program. It is not intended to diagnose MRSA infection nor to guide or monitor treatment for MRSA infections.     Coagulation Studies:  Recent Labs  05/14/15 1645  LABPROT 17.4*  INR 1.41    Urinalysis: No results for input(s): COLORURINE, LABSPEC, PHURINE, GLUCOSEU, HGBUR, BILIRUBINUR, KETONESUR, PROTEINUR, UROBILINOGEN, NITRITE, LEUKOCYTESUR in the last 72 hours.  Invalid input(s): APPERANCEUR    Imaging: [place] Renal  05/14/2015  CLINICAL DATA:  Acute renal failure EXAM: RENAL / URINARY TRACT ULTRASOUND COMPLETE COMPARISON:  04/07/2015 FINDINGS: Right Kidney: Length: 9.1 cm. Mild cortical thinning. Normal echogenicity. No hydronephrosis evident. Stable appearance. Left Kidney: Length: 10.3 cm. Similar mild cortical thinning. Normal echogenicity. Negative for hydronephrosis or obstruction.  Bladder: Collapsed by Foley catheter. IMPRESSION: Mild cortical thinning. No acute finding or hydronephrosis by ultrasound. Electronically Signed   By: Judie PetitM.  Shick M.D.   On: 05/14/2015 20:05   Dg Foot Complete Right  05/14/2015  CLINICAL DATA:  69 year old male with foot infection. Open wound at the fourth and fifth toes. Diabetes. Pain. Initial encounter. EXAM: RIGHT FOOT COMPLETE - 3+ VIEW COMPARISON:  02/10/2012. FINDINGS: Chronic but progressed demineralization in the distal metatarsals and phalanges. Widespread calcified peripheral vascular disease has progressed since 2013. No subcutaneous gas. Discrete soft tissue wounds are not evident. No left foot fracture, dislocation, or cortical osteolysis. Chronic degenerative changes at the first MTP joint. Chronic posttraumatic changes to the distal tibia and fibula with partially visible intra medullary rod. IMPRESSION: 1. No plain radiographic evidence of osteomyelitis at this time. No subcutaneous gas. 2. Progressed calcified peripheral vascular disease since 2013. Electronically Signed   By: Odessa FlemingH  Hall M.D.   On: 05/14/2015 12:30     Medications:   . sodium chloride    .  sodium bicarbonate 150 mEq in sterile water 1000 mL infusion 150 mEq (05/15/15 1042)   . amLODipine  5 mg Oral Daily  . apixaban  2.5 mg Oral BID  . carvedilol  12.5 mg Oral BID WC  . clindamycin (CLEOCIN) IV  600 mg Intravenous Q8H  . cloNIDine  0.1 mg Oral TID  . feeding supplement (PRO-STAT SUGAR FREE 64)  30 mL Oral Daily  . insulin aspart  0-15 Units Subcutaneous TID WC  . insulin aspart  0-5 Units Subcutaneous QHS  . nutrition supplement (JUVEN)  1 packet Oral BID BM  . sodium chloride  3 mL Intravenous Q12H  . tamsulosin  0.4 mg Oral Daily   acetaminophen **OR** acetaminophen, bisacodyl, HYDROcodone-acetaminophen, hydrOXYzine, LORazepam, morphine injection, ondansetron **OR** ondansetron (ZOFRAN) IV, traMADol  Assessment/ Plan:   Acute on chronic renal disease In  setting of low grade and vomiting possible volume depletion Urine in foley bag Condom catheter doubt obstruction  Serial electrolytes  Monitor Is and Os  Renal U/S no hydronephrosis  Hypertension/volume appears volume depleted                change to IV  saline  Will have to monitor closely in ICU due to risk of CHF  Rate conservative at 75cc/hr  Hyperkalemia No EKG changes although will proceed with treatment with insulin/ glucose and with IV bicarbonate  Hemolyzed specimen  Will need to recheck  Mild improved  Metabolic Acidosis improved  Chronic renal disease  Most likely diagnosis Diabetic Nephropathy   Ischemic foot With gangrene -- Clindamycin excellent choice for anerobes and gram positive converage May wish to add antibiotic for gram negatives   LOS: 1 Jajaira Ruis W @TODAY @1 :10752  PM   

## 2015-05-15 NOTE — Progress Notes (Signed)
VASCULAR LAB PRELIMINARY  ARTERIAL  ABI completed:  Right ABI indicates severe loss of flow.  Abnormal pedal waveforms and a flat great toe.  Left BKA.    RIGHT    LEFT    PRESSURE WAVEFORM  PRESSURE WAVEFORM  BRACHIAL 136 Triphasic  BRACHIAL 146 Triphasic   DP   DP    AT 67 Monophasic  AT    PT 75 Monophasic PT    PER   PER    GREAT TOE  flat GREAT TOE  NA    RIGHT LEFT  ABI 0.51 BKA  TBI flat      Deandre Brannan, RVT 05/15/2015, 10:17 AM

## 2015-05-15 NOTE — Progress Notes (Signed)
Initial Nutrition Assessment  DOCUMENTATION CODES:   Obesity unspecified  INTERVENTION:   Juven packets BID each provides 75 calories and 14 grams of protein  Pro-Stat Q24H each supplement provides 100 calories and 15 grams of protein   NUTRITION DIAGNOSIS:   Increased nutrient needs related to wound healing as evidenced by estimated needs.   GOAL:   Patient will meet greater than or equal to 90% of their needs  MONITOR:   PO intake, Supplement acceptance, I & O's, Labs  REASON FOR ASSESSMENT:   Consult Wound healing  ASSESSMENT:   69 year old male with past medical history of hypertension, CKD stage 4 (baseline Cr 2.4), left BKA, history of pulmonary embolism (on anticoagulation with apixaban) diabetes mellitus on insulin, recently hospitalized for acute encephalopathy thought to be due to acute infection, UTI. [facility] with worsening mental status changes per his wife who provided most of the history of present illness. Mental status changes started about 1-2 days prior to this admission. In addition, he had worsening wound on his right 4th toe over past few days and home nurse was concerned about possible infection. Wife also said his right leg was more red and swollen in past few days  RD consulted for wound healing, spoke with pt/wife at bedside. Pt is s/p BKA 07/2013  He now has a poorly healing diabetic foot ulcer on his 4th toe. Appears to be infected; celluitis. Pt also has Stg II pressure ulcer on buttocks. Wife admits patient hasn't eaten well since Thanksgiving when "he first got sick."  Pt is feeling better now, most recent PO intake 75%.  Will provide Juven BID, PS Q24H to help with healing.  CBGs have been slightly elevated, 107, 144, 81, 136. Shouldn't be affected by pro-stat or Juven.  Follow for supplement acceptance.  Diet Order:  Diet Heart Room service appropriate?: Yes; Fluid consistency:: Thin  Skin:  Wound (see comment) (Stg II  PU to buttocks, Infected 4th toe of foot.)  Last BM:  PTA  Height:   Ht Readings from Last 1 Encounters:  05/14/15 5\' 8"  (1.727 m)    Weight:   Wt Readings from Last 1 Encounters:  05/15/15 227 lb 11.8 oz (103.3 kg)    Ideal Body Weight:  64.54 kg  BMI:  Body mass index is 34.64 kg/(m^2).  Estimated Nutritional Needs:   Kcal:  2200-2500 calories  Protein:  120-145 grams  Fluid:  >/= 2.2L  EDUCATION NEEDS:   No education needs identified at this time  Dionne AnoWilliam M. Payton Prinsen, MS, RD LDN After Hours/Weekend Pager [phone]

## 2015-05-15 NOTE — Consult Note (Signed)
Vascular Surgery Consultation  Reason for Consult: Gangrene and infection right foot  HPI: Justin Delucia Sr. is a 69 y.o. male who presents for evaluation of gangrene and infection right foot. Was asked to evaluate patient regarding infected right foot. Patient had left below-knee amputation by Dr. Allie Bossier in February 2015. Patient has had admissions to go living nursing facility and [facility] in the last few months. He has developed sore on the lateral aspect of his right foot and some gangrenous toes. He was admitted for fluid overload and other medical issues. He is essentially nonambulatory since his amputation other than some minor walking with a walker.   History reviewed. No pertinent past medical history. Past Surgical History  Procedure Laterality Date  . Right leg fracture    . Skin graft    . Knee surgery    . Tee without cardioversion  03/17/2012    Procedure: TRANSESOPHAGEAL ECHOCARDIOGRAM (TEE);  Surgeon: Ricki Rodriguez, MD;  Location: [facility] ENDOSCOPY;  Service: Cardiovascular;  Laterality: N/A;  . Incision and drainage Left 07/02/2013    Procedure: INCISION AND DRAINAGE LEFT FOOT;  Surgeon: Kathryne Hitch, MD;  Location: [facility] ORS;  Service: Orthopedics;  Laterality: Left;  . Amputation Left 07/18/2013    Procedure: AMPUTATION BELOW KNEE;  Surgeon: Kathryne Hitch, MD;  Location: [facility] OR;  Service: Orthopedics;  Laterality: Left;   Social History   Social History  . Marital Status: Married    Spouse Name: N/A  . Number of Children: N/A  . Years of Education: N/A   Social History Main Topics  . Smoking status: Former Smoker    Quit date: 03/10/1984  . Smokeless tobacco: Never Used  . Alcohol Use: No  . Drug Use: No  . Sexual Activity: Yes   Other Topics Concern  . None   Social History Narrative   Family History  Problem Relation Age of Onset  . Heart attack Father   . Heart attack Mother   . Renal Disease Sister     one  of his sisters needed hemodialysis  . Diabetes Brother    Allergies  Allergen Reactions  . Ancef [Cefazolin] Itching and Rash  . Sulfa Antibiotics Itching and Rash   Prior to Admission medications   Medication Sig Start Date End Date Taking? Authorizing Provider  amLODipine (NORVASC) 5 MG tablet Take 5 mg by mouth daily.   Yes Historical Provider, MD  apixaban (ELIQUIS) 2.5 MG TABS tablet Take 2.5 mg by mouth 2 (two) times daily.   Yes Historical Provider, MD  bisacodyl (BISACODYL) 5 MG EC tablet Take 5 mg by mouth daily as needed for mild constipation or moderate constipation.   Yes Historical Provider, MD  carvedilol (COREG) 12.5 MG tablet Take 1 tablet (12.5 mg total) by mouth 2 (two) times daily with a meal. 03/14/13  Yes Rinaldo Cloud, MD  cloNIDine (CATAPRES) 0.1 MG tablet Take 0.1 mg by mouth 3 (three) times daily.    Yes Historical Provider, MD  furosemide (LASIX) 40 MG tablet Take 40 mg by mouth daily.   Yes Historical Provider, MD  HYDROcodone-acetaminophen (NORCO/VICODIN) 5-325 MG tablet Take 1 tablet by mouth every 6 (six) hours as needed for moderate pain.   Yes Historical Provider, MD  hydrOXYzine (ATARAX/VISTARIL) 10 MG tablet Take 10 mg by mouth 3 (three) times daily as needed for itching.   Yes Historical Provider, MD  insulin aspart (NOVOLOG) 100 UNIT/ML injection Inject into the skin 3 (three) times daily  with meals. SSI 200-300   7 UNITS; 301-400  9 UNITS; 401-500   12  UNITS; > 500 14  UNITS AND CALL MD   CBG AC AND QHS 07/04/13  Yes Alison MurrayAlma M Devine, MD  liver oil-zinc oxide (DESITIN) 40 % ointment Apply topically 2 (two) times daily at 10 am and 4 pm. Patient taking differently: Apply 1 application topically daily.  04/09/15  Yes Kathlen ModyVijaya Akula, MD  potassium chloride (K-DUR) 10 MEQ tablet Take 10 mEq by mouth 2 (two) times daily.   Yes Historical Provider, MD  Tamsulosin HCl (FLOMAX) 0.4 MG CAPS Take 1 capsule (0.4 mg total) by mouth daily. 04/01/12  Yes Rinaldo CloudMohan Harwani, MD   traMADol (ULTRAM) 50 MG tablet Take 1 tablet (50 mg total) by mouth every 6 (six) hours as needed for moderate pain. 04/08/15  Yes Kathlen ModyVijaya Akula, MD     Positive ROS:   All other systems have been reviewed and were otherwise negative with the exception of those mentioned in the HPI and as above.  Physical Exam: Filed Vitals:   05/15/15 1100 05/15/15 1602  BP:    Pulse:    Temp: 97.9 F (36.6 C) 98.1 F (36.7 C)  Resp:      General: Alert, no acute distress HEENT: Normal for age Cardiovascular: Regular rate and rhythm. Carotid pulses 2+, no bruits audible Respiratory: Clear to auscultation. No cyanosis, no use of accessory musculature GI: No organomegaly, abdomen is soft and non-tender-obese Skin: Dry eschar lateral right foot over fifth metatarsal bone with gangrene portions of third fourth and fifth right toes. Neurologic: Sensation intact distally Psychiatric: Patient is competent for consent with normal mood and affect Musculoskeletal: No obvious deformities Extremities: Left BKA well-healed Right leg with 3+ femoral pulse palpable. Brisk Doppler flow in anterior tibial artery in the foot. Gangrene of portions of right third fourth and fifth toes with ulcer on weight bearing surface lateral aspect right foot. Right leg is held in the externally rotated position putting pressure on the lateral aspect right fo   Assessment/Plan:  #1 gangrene portions right third fourth and fifth toes with pressure sore lateral aspect right foot. 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 Delynn Flavinhris Blackmon after attempt at I&D of infected area left foot was unsuccessful #2 chronic renal insufficiency #3 diabetes mellitus1  No role for vascular reconstruction in this patient who will likely eventually require right below-knee amputation as he had on the left Recommend consultation with Dr. Delynn Flavinhris Blackmon who performed left  BKA. Patient may need either lateral foot amputation or transmetatarsal foot amputation versus BKA in the near future. Because of the position of patient's leg with external rotation and constant pressure on the lateral aspect of foot-doubt that foot amputation will heal and that he will likely require BKA No role for vascular intervention  Josephina GipJames Sevana Grandinetti, MD 05/15/2015 4:12 PM

## 2015-05-15 NOTE — Progress Notes (Addendum)
Inpatient Diabetes Program Recommendations  AACE/ADA: New Consensus Statement on Inpatient Glycemic Control (2015)  Target Ranges:  Prepandial:   less than 140 mg/dL      Peak postprandial:   less than 180 mg/dL (1-2 hours)      Critically ill patients:  140 - 180 mg/dL   Review of Glycemic Control Inpatient Diabetes Program Recommendations:    CBG's are controlled using present insulin regimen.  Noted patient admitted for R foot/toe ulcer -noted poor blood supply to the toe from vascular lab. May want to consider a consult with US Radiology Evaluation and Management (under facilites list of consults) to evaluate vascular supply to the wound/ulcer.  Thank you Lenor CoffinAnn Carrera Kiesel, RN, MSN, CDE  Diabetes Inpatient Program Office: [phone] Pager: [phone] 8:00 am to 5:00 pm

## 2015-05-15 NOTE — Progress Notes (Signed)
[facility] PROGRESS NOTE  Justin Teixeira Sr. WUJ:[id number] DOB: 1946/01/04 DOA: 05/14/2015 PCP: [name], MD  HPI/Brief narrative 69 year old male with past medical history of hypertension, CKD stage 4 (baseline Cr 2.4), left BKA, history of pulmonary embolism (on anticoagulation with apixaban) diabetes mellitus on insulin, recently hospitalized for acute encephalopathy thought to be due to acute infection from presumed UTI as well as possible R foot cellulitis. The patient was admitted for further work up.  Assessment/Plan:  Cellulitis of right foot, 4th toe / Leukocytosis - Right foot 4th toe looks infected. Cellulitis order set placed. Choice of abx clindamycin for now (did not place vanco because of renal failure adn because per order set classification - non purulent moderate disease clinda is one of the choices) - May need ortho eval if no improvement - WOC consulted. Recs noted. Also recs to consult Vascular Surgery vs Orthopedic Surgery - R sided abi performed on 12/7 and demonstrated severe loss of flow with abnormal waveforms and flat great toe   Acute encephalopathy - Likely from acute infection, acute on chronic renal failure - Monitor mental status    S/P BKA (below knee amputation) unilateral (HCC) - Stable    History of pulmonary embolism - Continue apixaban    Acute renal failure superimposed on stage 4 chronic kidney disease (HCC) / Metabolic acidosis - Baseline Cr 2.4 - Cr on this admission 2.3, likely due to lasix - Lasix currently on hold - Cr up to 2.7 today - Renal was consulted and patient was started on sodium bicarb infusion    Hyperkalemia - Due to acute on chronic renal failure as well as potassium supplementation  - Given insulin, calcium gluconate and albuterol in ED - Potassium level improved   Uncontrolled diabetes mellitus with circulatory complication, with long-term current use of insulin (HCC) / Diabetic peripheral  neuropathy (HCC) - A1c in 2015 was 8 - Check A1c on this admission - On SSI at home. Order placed for SSI.    Anemia of chronic renal failure, stage 4 (severe) (HCC) - Hemoglobin stable - No current indications for transfusion   Benign essential HTN - Continue Norvasc, carvedilol and clonidine   Chronic combined systolic and diastolic CHF (congestive heart failure) (HCC)  - Stable, compensated - Last 2 D ECHO in 03/2013 with EF 50% grade 1 diastolic dysfunction - Lasix on hold due to worsening renal function  DVT prophylaxis:  - SCD"s bilaterally while admitted  Code Status: DNR Family Communication: Pt in room Disposition Plan: Pending PT eval   Consultants:  Nephrology  Procedures:    Antibiotics: Anti-infectives    Start     Dose/Rate Route Frequency Ordered Stop   05/15/15 0200  clindamycin (CLEOCIN) IVPB 600 mg     600 mg 100 mL/hr over 30 Minutes Intravenous Every 8 hours 05/14/15 1826     05/14/15 1445  clindamycin (CLEOCIN) IVPB 600 mg     600 mg 100 mL/hr over 30 Minutes Intravenous  Once 05/14/15 1435 05/14/15 1847      HPI/Subjective: No complaints this AM  Objective: Filed Vitals:   05/15/15 0400 05/15/15 0600 05/15/15 0834 05/15/15 1100  BP: 155/67     Pulse: 75     Temp: 99.7 F (37.6 C)  97.9 F (36.6 C) 97.9 F (36.6 C)  TempSrc: Oral     Resp: 16     Height:      Weight:  103.3 kg (227 lb 11.8 oz)    SpO2:  100%       Intake/Output Summary (Last 24 hours) at 05/15/15 1354 Last data filed at 05/15/15 1300  Gross per 24 hour  Intake 2622.08 ml  Output    700 ml  Net 1922.08 ml   Filed Weights   05/15/15 0600  Weight: 103.3 kg (227 lb 11.8 oz)    Exam:   General:  Awake, in nad  Cardiovascular: regular, s1, s2  Respiratory: normal resp effort  Abdomen: soft, nondistended  Musculoskeletal: s/p LLE amputation, RLE with AFO in place   Data Reviewed: Basic Metabolic Panel:  Recent Labs Lab 05/14/15 1322  05/14/15 1645 05/15/15 0350  NA 140 144  144 144  K 7.3* 6.5*  6.5* 5.6*  CL 120* 120*  120* 115*  CO2 16* 19*  18* 21*  GLUCOSE 97 77  78 117*  BUN 51* 50*  51* 47*  CREATININE 2.60* 2.64*  2.56* 2.70*  CALCIUM 8.6* 8.8*  8.8* 8.4*  MG  --  2.3  --   PHOS  --  5.1*  5.0*  --    Liver Function Tests:  Recent Labs Lab 05/14/15 1645  ALBUMIN 2.5*   No results for input(s): LIPASE, AMYLASE in the last 168 hours. No results for input(s): AMMONIA in the last 168 hours. CBC:  Recent Labs Lab 05/14/15 1155 05/14/15 1645 05/15/15 0511  WBC 10.8* 12.3* 9.7  NEUTROABS 8.3* 9.4*  --   HGB 9.9* 9.0* 8.4*  HCT 30.2* 28.3* 25.5*  MCV 83.0 84.2 81.5  PLT 338 309 272   Cardiac Enzymes: No results for input(s): CKTOTAL, CKMB, CKMBINDEX, TROPONINI in the last 168 hours. BNP (last 3 results)  Recent Labs  04/05/15 1439  BNP 175.9*    ProBNP (last 3 results) No results for input(s): PROBNP in the last 8760 hours.  CBG:  Recent Labs Lab 05/14/15 1542 05/14/15 1706 05/14/15 2127 05/15/15 0755 05/15/15 1116  GLUCAP 136* 81 144* 107* 169*    Recent Results (from the past 240 hour(s))  Blood culture (routine x 2)     Status: None (Preliminary result)   Collection Time: 05/14/15 11:51 AM  Result Value Ref Range Status   Specimen Description BLOOD RIGHT ANTECUBITAL  Final   Special Requests BOTTLES DRAWN AEROBIC AND ANAEROBIC  Final   Culture   Final    NO GROWTH < 24 HOURS Performed at [facility]    Report Status PENDING  Incomplete  Blood culture (routine x 2)     Status: None (Preliminary result)   Collection Time: 05/14/15  1:23 PM  Result Value Ref Range Status   Specimen Description BLOOD LEFT HAND  Final   Special Requests BOTTLES DRAWN AEROBIC AND ANAEROBIC 5CC  Final   Culture   Final    NO GROWTH < 24 HOURS Performed at [facility]    Report Status PENDING  Incomplete  MRSA PCR Screening     Status: None   Collection  Time: 05/14/15  4:26 PM  Result Value Ref Range Status   MRSA by PCR NEGATIVE NEGATIVE Final    Comment:        The GeneXpert MRSA Assay (FDA approved for NASAL specimens only), is one component of a comprehensive MRSA colonization surveillance program. It is not intended to diagnose MRSA infection nor to guide or monitor treatment for MRSA infections.      Studies: US Renal  05/14/2015  CLINICAL DATA:  Acute renal failure EXAM: RENAL / URINARY TRACT  ULTRASOUND COMPLETE COMPARISON:  04/07/2015 FINDINGS: Right Kidney: Length: 9.1 cm. Mild cortical thinning. Normal echogenicity. No hydronephrosis evident. Stable appearance. Left Kidney: Length: 10.3 cm. Similar mild cortical thinning. Normal echogenicity. Negative for hydronephrosis or obstruction. Bladder: Collapsed by Foley catheter. IMPRESSION: Mild cortical thinning. No acute finding or hydronephrosis by ultrasound. Electronically Signed   By: Judie PetitM.  Shick M.D.   On: 05/14/2015 20:05   Dg Foot Complete Right  05/14/2015  CLINICAL DATA:  69 year old male with foot infection. Open wound at the fourth and fifth toes. Diabetes. Pain. Initial encounter. EXAM: RIGHT FOOT COMPLETE - 3+ VIEW COMPARISON:  02/10/2012. FINDINGS: Chronic but progressed demineralization in the distal metatarsals and phalanges. Widespread calcified peripheral vascular disease has progressed since 2013. No subcutaneous gas. Discrete soft tissue wounds are not evident. No left foot fracture, dislocation, or cortical osteolysis. Chronic degenerative changes at the first MTP joint. Chronic posttraumatic changes to the distal tibia and fibula with partially visible intra medullary rod. IMPRESSION: 1. No plain radiographic evidence of osteomyelitis at this time. No subcutaneous gas. 2. Progressed calcified peripheral vascular disease since 2013. Electronically Signed   By: Odessa FlemingH  Hall M.D.   On: 05/14/2015 12:30    Scheduled Meds: . amLODipine  5 mg Oral Daily  . apixaban  2.5 mg  Oral BID  . carvedilol  12.5 mg Oral BID WC  . clindamycin (CLEOCIN) IV  600 mg Intravenous Q8H  . cloNIDine  0.1 mg Oral TID  . feeding supplement (PRO-STAT SUGAR FREE 64)  30 mL Oral Daily  . insulin aspart  0-15 Units Subcutaneous TID WC  . insulin aspart  0-5 Units Subcutaneous QHS  . nutrition supplement (JUVEN)  1 packet Oral BID BM  . sodium chloride  3 mL Intravenous Q12H  . tamsulosin  0.4 mg Oral Daily   Continuous Infusions: . sodium chloride    .  sodium bicarbonate 150 mEq in sterile water 1000 mL infusion 150 mEq (05/15/15 1042)    Principal Problem:   Cellulitis of fourth toe of right foot Active Problems:   Diabetic peripheral neuropathy (HCC)   Acute encephalopathy   S/P BKA (below knee amputation) unilateral (HCC)   History of pulmonary embolism   Acute renal failure superimposed on stage 4 chronic kidney disease (HCC)   Hyperkalemia   Metabolic acidosis   Uncontrolled diabetes mellitus with circulatory complication, with long-term current use of insulin (HCC)   Anemia of chronic renal failure, stage 4 (severe) (HCC)   Benign essential HTN   Chronic combined systolic and diastolic CHF (congestive heart failure) (HCC)   Leukocytosis   [name] K  [facility] Pager [phone]. If 7PM-7AM, please contact night-coverage at [email or web address], password [email or web address] 05/15/2015, 1:54 PM  LOS: 1 day

## 2015-05-15 NOTE — Consult Note (Signed)
WOC wound consult note Reason for Consult: right 4th and 5th toe and lateral foot necrosis with gangrenous presentation Wound type:infectious, peripheral vascular insufficiency Pressure Ulcer POA: No Measurement: 4th and 5th digits of right foot, lateral foot necrosis (soft eschar) measuring 6cm x 4cm. No crepitus or fluctuance noted.  Wound bed:as described above Drainage (amount, consistency, odor) serosanguinous with strong odor consistent with necrotic tissue Periwound: erythematous Dressing procedure/placement/frequency: Evaluations are ongoing for extent of peripheral disease and ability to salvage limb.  Family is awaiting consultation with physician at this time. I have provided Nursing with guidance to keep affected digits and lateral foot clean, dry and odor free with conservative dressings. Foot is placed into a pressure redistribution boot and while it is soiled with exudate, I will not order another until we know if limb is salvagable. Suggest VVS or Orthopedic consultation.  If you agree, please order.  WOC nursing team will not follow, but will remain available to this patient, the nursing and medical teams.  Please re-consult if needed. Thanks, Ladona MowLaurie Zenobia Kuennen, MSN, RN, GNP, Hans EdenCWOCN, CWON-AP, FAAN  Pager# [phone]

## 2015-05-16 LAB — GLUCOSE, CAPILLARY
Glucose-Capillary: 145 mg/dL — ABNORMAL HIGH (ref 65–99)
Glucose-Capillary: 180 mg/dL — ABNORMAL HIGH (ref 65–99)
Glucose-Capillary: 161 mg/dL — ABNORMAL HIGH (ref 65–99)
Glucose-Capillary: 207 mg/dL — ABNORMAL HIGH (ref 65–99)

## 2015-05-16 LAB — CBC
HEMATOCRIT: 23 % — AB (ref 39.0–52.0)
Hemoglobin: 7.3 g/dL — ABNORMAL LOW (ref 13.0–17.0)
MCH: 26.5 pg (ref 26.0–34.0)
MCHC: 31.7 g/dL (ref 30.0–36.0)
MCV: 83.6 fL (ref 78.0–100.0)
Platelets: 259 10*3/uL (ref 150–400)
RBC: 2.75 MIL/uL — ABNORMAL LOW (ref 4.22–5.81)
RDW: 13.5 % (ref 11.5–15.5)
WBC: 10.4 10*3/uL (ref 4.0–10.5)

## 2015-05-16 LAB — BASIC METABOLIC PANEL
Anion gap: 6 (ref 5–15)
BUN: 54 mg/dL — AB (ref 6–20)
CHLORIDE: 109 mmol/L (ref 101–111)
CO2: 27 mmol/L (ref 22–32)
Calcium: 7.5 mg/dL — ABNORMAL LOW (ref 8.9–10.3)
Creatinine, Ser: 2.79 mg/dL — ABNORMAL HIGH (ref 0.61–1.24)
GFR calc Af Amer: 25 mL/min — ABNORMAL LOW (ref >60)
GFR calc non Af Amer: 21 mL/min — ABNORMAL LOW (ref >60)
GLUCOSE: 151 mg/dL — AB (ref 65–99)
POTASSIUM: 4.7 mmol/L (ref 3.5–5.1)
Sodium: 142 mmol/L (ref 135–145)

## 2015-05-16 NOTE — Progress Notes (Signed)
[facility] ROUNDING NOTE   Subjective:   Interval History: No complaints  Objective:  Vital signs in last 24 hours:  Temp:  [97.7 F (36.5 C)-98.9 F (37.2 C)] 97.7 F (36.5 C) (12/08 1100) Pulse Rate:  [62-145] 71 (12/08 1200) Resp:  [14-20] 18 (12/08 1200) BP: (91-140)/(46-64) 135/59 mmHg (12/08 1200) SpO2:  [98 %-100 %] 100 % (12/08 1200) Weight:  [106.6 kg (235 lb 0.2 oz)] 106.6 kg (235 lb 0.2 oz) (12/08 0500)  Weight change: 3.3 kg (7 lb 4.4 oz) Filed Weights   05/15/15 0600 05/16/15 0500  Weight: 103.3 kg (227 lb 11.8 oz) 106.6 kg (235 lb 0.2 oz)    Intake/Output: I/O last 3 completed shifts: In: 3645 [P.O.:120; I.V.:3325; IV Piggyback:200] Out: 775 [Urine:775]   Intake/Output this shift:  Total I/O In: 425 [I.V.:375; IV Piggyback:50] Out: -   CVS- RRR RS- CTA ABD- BS present soft non-distended EXT- no edema L BKA   Basic Metabolic Panel:  Recent Labs Lab 05/14/15 1322 05/14/15 1645 05/15/15 0350 05/16/15 0330  NA 140 144  144 144 142  K 7.3* 6.5*  6.5* 5.6* 4.7  CL 120* 120*  120* 115* 109  CO2 16* 19*  18* 21* 27  GLUCOSE 97 77  78 117* 151*  BUN 51* 50*  51* 47* 54*  CREATININE 2.60* 2.64*  2.56* 2.70* 2.79*  CALCIUM 8.6* 8.8*  8.8* 8.4* 7.5*  MG  --  2.3  --   --   PHOS  --  5.1*  5.0*  --   --     Liver Function Tests:  Recent Labs Lab 05/14/15 1645  ALBUMIN 2.5*   No results for input(s): LIPASE, AMYLASE in the last 168 hours. No results for input(s): AMMONIA in the last 168 hours.  CBC:  Recent Labs Lab 05/14/15 1155 05/14/15 1645 05/15/15 0511 05/16/15 0330  WBC 10.8* 12.3* 9.7 10.4  NEUTROABS 8.3* 9.4*  --   --   HGB 9.9* 9.0* 8.4* 7.3*  HCT 30.2* 28.3* 25.5* 23.0*  MCV 83.0 84.2 81.5 83.6  PLT 338 309 272 259    Cardiac Enzymes: No results for input(s): CKTOTAL, CKMB, CKMBINDEX, TROPONINI in the last 168 hours.  BNP: Invalid input(s): POCBNP  CBG:  Recent Labs Lab 05/15/15 1116  05/15/15 1532 05/15/15 2216 05/16/15 0724 05/16/15 1157  GLUCAP 169* 171* 229* 145* 180*    Microbiology: Results for orders placed or performed during the hospital encounter of 05/14/15  Blood culture (routine x 2)     Status: None (Preliminary result)   Collection Time: 05/14/15 11:51 AM  Result Value Ref Range Status   Specimen Description BLOOD RIGHT ANTECUBITAL  Final   Special Requests BOTTLES DRAWN AEROBIC AND ANAEROBIC  Final   Culture   Final    NO GROWTH < 24 HOURS Performed at [facility]    Report Status PENDING  Incomplete  Blood culture (routine x 2)     Status: None (Preliminary result)   Collection Time: 05/14/15  1:23 PM  Result Value Ref Range Status   Specimen Description BLOOD LEFT HAND  Final   Special Requests BOTTLES DRAWN AEROBIC AND ANAEROBIC 5CC  Final   Culture   Final    NO GROWTH < 24 HOURS Performed at [facility]    Report Status PENDING  Incomplete  MRSA PCR Screening     Status: None   Collection Time: 05/14/15  4:26 PM  Result Value Ref Range Status  MRSA by PCR NEGATIVE NEGATIVE Final    Comment:        The GeneXpert MRSA Assay (FDA approved for NASAL specimens only), is one component of a comprehensive MRSA colonization surveillance program. It is not intended to diagnose MRSA infection nor to guide or monitor treatment for MRSA infections.     Coagulation Studies:  Recent Labs  05/14/15 1645  LABPROT 17.4*  INR 1.41    Urinalysis: No results for input(s): COLORURINE, LABSPEC, PHURINE, GLUCOSEU, HGBUR, BILIRUBINUR, KETONESUR, PROTEINUR, UROBILINOGEN, NITRITE, LEUKOCYTESUR in the last 72 hours.  Invalid input(s): APPERANCEUR    Imaging: [place] Renal  05/14/2015  CLINICAL DATA:  Acute renal failure EXAM: RENAL / URINARY TRACT ULTRASOUND COMPLETE COMPARISON:  04/07/2015 FINDINGS: Right Kidney: Length: 9.1 cm. Mild cortical thinning. Normal echogenicity. No hydronephrosis evident. Stable appearance. Left  Kidney: Length: 10.3 cm. Similar mild cortical thinning. Normal echogenicity. Negative for hydronephrosis or obstruction. Bladder: Collapsed by Foley catheter. IMPRESSION: Mild cortical thinning. No acute finding or hydronephrosis by ultrasound. Electronically Signed   By: Judie PetitM.  Shick M.D.   On: 05/14/2015 20:05     Medications:   . sodium chloride 75 mL/hr at 05/16/15 1200   . amLODipine  5 mg Oral Daily  . apixaban  2.5 mg Oral BID  . carvedilol  12.5 mg Oral BID WC  . clindamycin (CLEOCIN) IV  600 mg Intravenous Q8H  . cloNIDine  0.1 mg Oral TID  . feeding supplement (PRO-STAT SUGAR FREE 64)  30 mL Oral Daily  . insulin aspart  0-15 Units Subcutaneous TID WC  . insulin aspart  0-5 Units Subcutaneous QHS  . nutrition supplement (JUVEN)  1 packet Oral BID BM  . sodium chloride  3 mL Intravenous Q12H  . tamsulosin  0.4 mg Oral Daily   acetaminophen **OR** acetaminophen, bisacodyl, HYDROcodone-acetaminophen, hydrOXYzine, LORazepam, morphine injection, ondansetron **OR** ondansetron (ZOFRAN) IV, traMADol  Assessment/ Plan:   Acute on chronic renal disease In setting of low grade and vomiting possible volume depletion Urine in foley bag Condom catheter doubt obstruction  Serial electrolytes  Monitor Is and Os  Renal U/S no hydronephrosis  Hypertension/volume appears volume depleted  change to IV saline  Will have to monitor closely in ICU due to risk of CHF  Rate conservative at 75cc/hr  Hyperkalemia No EKG changes although will proceed with treatment with insulin/ glucose and with IV bicarbonate  better   Metabolic Acidosis improved  Chronic renal disease  Most likely diagnosis Diabetic Nephropathy   Ischemic foot With gangrene -- Clindamycin excellent choice   LOS: 2 Juel Ripley W @TODAY @1 :04 PM

## 2015-05-16 NOTE — Progress Notes (Signed)
Date: May 16, 2015 Chart reviewed for concurrent status and case management needs. Will continue to follow patient for changes and needs: Rhonda Davis, RN, BSN, CCM   [phone] 

## 2015-05-16 NOTE — Progress Notes (Signed)
[facility] PROGRESS NOTE  Justin HansenDonald Verrill Sr. YQM:[id number] DOB: 06/15/1945 DOA: 05/14/2015 PCP: Evlyn CourierHILL,[name], MD  HPI/Brief narrative 69 year old male with past medical history of hypertension, CKD stage 4 (baseline Cr 2.4), left BKA, history of pulmonary embolism (on anticoagulation with apixaban) diabetes mellitus on insulin, recently hospitalized for acute encephalopathy thought to be due to acute infection from presumed UTI as well as possible R foot cellulitis. The patient was admitted for further work up.  Assessment/Plan:  Cellulitis of right foot, 4th toe / Leukocytosis - Right foot 4th toe looks infected. Cellulitis order set placed. Choice of abx clindamycin for now (did not place vanco because of renal failure adn because per order set classification - non purulent moderate disease clinda is one of the choices) - WOC consulted. Recs noted. Also recs to consult Vascular Surgery vs Orthopedic Surgery - R sided abi performed on 12/7 and demonstrated severe loss of flow with abnormal waveforms and flat great toe - Appreciate input by Dr. Hart RochesterLawson. Pt is bad vasculopath and will likely ultimately require amputation of RLE. Have discussed case with pt's Orthopod, Dr. Magnus IvanBlackman who will assess pt later   Acute encephalopathy - Likely from acute infection, acute on chronic renal failure - Monitor mental status - Stable thus far   S/P BKA (below knee amputation) unilateral (HCC) - Stable    History of pulmonary embolism - Continue apixaban    Acute renal failure superimposed on stage 4 chronic kidney disease (HCC) / Metabolic acidosis - Baseline Cr 2.4 - Cr on this admission 2.3, likely due to lasix - Lasix currently on hold - Cr currently 2.79 - Nephrology was consulted and is following   Hyperkalemia - Due to acute on chronic renal failure as well as potassium supplementation  - Given insulin, calcium gluconate and albuterol in ED - Potassium level normalized    Uncontrolled diabetes mellitus with circulatory complication, with long-term current use of insulin (HCC) / Diabetic peripheral neuropathy (HCC) - A1c in 2015 was 8 - Check A1c on this admission - On SSI at home. Order placed for SSI.    Anemia of chronic renal failure, stage 4 (severe) (HCC) - Hemoglobin stable - No current indications for transfusion   Benign essential HTN - Continue Norvasc, carvedilol and clonidine   Chronic combined systolic and diastolic CHF (congestive heart failure) (HCC)  - Remains stable, compensated - Last 2 D ECHO in 03/2013 with EF 50% grade 1 diastolic dysfunction - Lasix on hold due to worsening renal function  DVT prophylaxis:  - SCD"s bilaterally while admitted  Code Status: DNR Family Communication: Pt in room Disposition Plan: Pending PT eval   Consultants:  Nephrology - Dr. Hyman HopesWebb  Vascular Surgery - Dr. Hart RochesterLawson  Orthopedic Surgery - Dr. Magnus IvanBlackman  Procedures:    Antibiotics: Anti-infectives    Start     Dose/Rate Route Frequency Ordered Stop   05/15/15 0200  clindamycin (CLEOCIN) IVPB 600 mg     600 mg 100 mL/hr over 30 Minutes Intravenous Every 8 hours 05/14/15 1826     05/14/15 1445  clindamycin (CLEOCIN) IVPB 600 mg     600 mg 100 mL/hr over 30 Minutes Intravenous  Once 05/14/15 1435 05/14/15 1847      HPI/Subjective: Somewhat confused, otherwise without complaints  Objective: Filed Vitals:   05/16/15 0800 05/16/15 0839 05/16/15 1100 05/16/15 1200  BP: 91/46   135/59  Pulse: 65   71  Temp:  98.9 F (37.2 C) 97.7 F (36.5 C)  TempSrc:      Resp: 14   18  Height:      Weight:      SpO2: 100%   100%    Intake/Output Summary (Last 24 hours) at 05/16/15 1547 Last data filed at 05/16/15 1200  Gross per 24 hour  Intake   1845 ml  Output    475 ml  Net   1370 ml   Filed Weights   05/15/15 0600 05/16/15 0500  Weight: 103.3 kg (227 lb 11.8 oz) 106.6 kg (235 lb 0.2 oz)    Exam:   General:  Awake, sitting  in bed eating breakfast, in nad  Cardiovascular: regular, s1, s2  Respiratory: normal resp effort  Abdomen: soft, nondistended, pos BS  Musculoskeletal: s/p LLE amputation, RLE with AFO in place   Data Reviewed: Basic Metabolic Panel:  Recent Labs Lab 05/14/15 1322 05/14/15 1645 05/15/15 0350 05/16/15 0330  NA 140 144  144 144 142  [name] 7.3* 6.5*  6.5* 5.6* 4.7  CL 120* 120*  120* 115* 109  CO2 16* 19*  18* 21* 27  GLUCOSE 97 77  78 117* 151*  BUN 51* 50*  51* 47* 54*  CREATININE 2.60* 2.64*  2.56* 2.70* 2.79*  CALCIUM 8.6* 8.8*  8.8* 8.4* 7.5*  MG  --  2.3  --   --   PHOS  --  5.1*  5.0*  --   --    Liver Function Tests:  Recent Labs Lab 05/14/15 1645  ALBUMIN 2.5*   No results for input(s): LIPASE, AMYLASE in the last 168 hours. No results for input(s): AMMONIA in the last 168 hours. CBC:  Recent Labs Lab 05/14/15 1155 05/14/15 1645 05/15/15 0511 05/16/15 0330  WBC 10.8* 12.3* 9.7 10.4  NEUTROABS 8.3* 9.4*  --   --   HGB 9.9* 9.0* 8.4* 7.3*  HCT 30.2* 28.3* 25.5* 23.0*  MCV 83.0 84.2 81.5 83.6  PLT 338 309 272 259   Cardiac Enzymes: No results for input(s): CKTOTAL, CKMB, CKMBINDEX, TROPONINI in the last 168 hours. BNP (last 3 results)  Recent Labs  04/05/15 1439  BNP 175.9*    ProBNP (last 3 results) No results for input(s): PROBNP in the last 8760 hours.  CBG:  Recent Labs Lab 05/15/15 1116 05/15/15 1532 05/15/15 2216 05/16/15 0724 05/16/15 1157  GLUCAP 169* 171* 229* 145* 180*    Recent Results (from the past 240 hour(s))  Blood culture (routine x 2)     Status: None (Preliminary result)   Collection Time: 05/14/15 11:51 AM  Result Value Ref Range Status   Specimen Description BLOOD RIGHT ANTECUBITAL  Final   Special Requests BOTTLES DRAWN AEROBIC AND ANAEROBIC  Final   Culture   Final    NO GROWTH 2 DAYS Performed at [facility]    Report Status PENDING  Incomplete  Blood culture (routine x 2)      Status: None (Preliminary result)   Collection Time: 05/14/15  1:23 PM  Result Value Ref Range Status   Specimen Description BLOOD LEFT HAND  Final   Special Requests BOTTLES DRAWN AEROBIC AND ANAEROBIC 5CC  Final   Culture   Final    NO GROWTH 2 DAYS Performed at [facility]    Report Status PENDING  Incomplete  MRSA PCR Screening     Status: None   Collection Time: 05/14/15  4:26 PM  Result Value Ref Range Status   MRSA by PCR NEGATIVE NEGATIVE Final  Comment:        The GeneXpert MRSA Assay (FDA approved for NASAL specimens only), is one component of a comprehensive MRSA colonization surveillance program. It is not intended to diagnose MRSA infection nor to guide or monitor treatment for MRSA infections.      Studies: US Renal  05/14/2015  CLINICAL DATA:  Acute renal failure EXAM: RENAL / URINARY TRACT ULTRASOUND COMPLETE COMPARISON:  04/07/2015 FINDINGS: Right Kidney: Length: 9.1 cm. Mild cortical thinning. Normal echogenicity. No hydronephrosis evident. Stable appearance. Left Kidney: Length: 10.3 cm. Similar mild cortical thinning. Normal echogenicity. Negative for hydronephrosis or obstruction. Bladder: Collapsed by Foley catheter. IMPRESSION: Mild cortical thinning. No acute finding or hydronephrosis by ultrasound. Electronically Signed   By: Judie Petit.  Shick M.D.   On: 05/14/2015 20:05    Scheduled Meds: . amLODipine  5 mg Oral Daily  . apixaban  2.5 mg Oral BID  . carvedilol  12.5 mg Oral BID WC  . clindamycin (CLEOCIN) IV  600 mg Intravenous Q8H  . cloNIDine  0.1 mg Oral TID  . feeding supplement (PRO-STAT SUGAR FREE 64)  30 mL Oral Daily  . insulin aspart  0-15 Units Subcutaneous TID WC  . insulin aspart  0-5 Units Subcutaneous QHS  . nutrition supplement (JUVEN)  1 packet Oral BID BM  . sodium chloride  3 mL Intravenous Q12H  . tamsulosin  0.4 mg Oral Daily   Continuous Infusions: . sodium chloride 75 mL/hr at 05/16/15 1200    Principal Problem:    Cellulitis of fourth toe of right foot Active Problems:   Diabetic peripheral neuropathy (HCC)   Acute encephalopathy   S/P BKA (below knee amputation) unilateral (HCC)   History of pulmonary embolism   Acute renal failure superimposed on stage 4 chronic kidney disease (HCC)   Hyperkalemia   Metabolic acidosis   Uncontrolled diabetes mellitus with circulatory complication, with long-term current use of insulin (HCC)   Anemia of chronic renal failure, stage 4 (severe) (HCC)   Benign essential HTN   Chronic combined systolic and diastolic CHF (congestive heart failure) (HCC)   Leukocytosis   [name]  [facility] Pager [phone]. If 7PM-7AM, please contact night-coverage at [email or web address], password [facility] 05/16/2015, 3:47 PM  LOS: 2 days

## 2015-05-16 NOTE — Consult Note (Signed)
Reason for Consult:  Right forefoot wound and ischemic toes due to PVD Referring Physician: [facility], Worth Kober Sr. is an 69 y.o. male.  HPI:   69 yo male with multiple medical problems as well as significant PVD.  I performed a left BKA in the past.  He has been recently admitted and I was called to come by and see him to assess his right foot due to a non-healing wound and ischemic toes.  History reviewed. No pertinent past medical history.  Past Surgical History  Procedure Laterality Date  . Right leg fracture    . Skin graft    . Knee surgery    . Tee without cardioversion  03/17/2012    Procedure: TRANSESOPHAGEAL ECHOCARDIOGRAM (TEE);  Surgeon: Birdie Riddle, MD;  Location: [place];  Service: Cardiovascular;  Laterality: N/A;  . Incision and drainage Left 07/02/2013    Procedure: INCISION AND DRAINAGE LEFT FOOT;  Surgeon: Mcarthur Rossetti, MD;  Location: [facility];  Service: Orthopedics;  Laterality: Left;  . Amputation Left 07/18/2013    Procedure: AMPUTATION BELOW KNEE;  Surgeon: Mcarthur Rossetti, MD;  Location: [place];  Service: Orthopedics;  Laterality: Left;    Family History  Problem Relation Age of Onset  . Heart attack Father   . Heart attack Mother   . Renal Disease Sister     one of his sisters needed hemodialysis  . Diabetes Brother     Social History:  reports that he quit smoking about 31 years ago. He has never used smokeless tobacco. He reports that he does not drink alcohol or use illicit drugs.  Allergies:  Allergies  Allergen Reactions  . Ancef [Cefazolin] Itching and Rash  . Sulfa Antibiotics Itching and Rash    Medications: I have reviewed the patient's current medications.  Results for orders placed or performed during the hospital encounter of 05/14/15 (from the past 48 hour(s))  Glucose, capillary     Status: Abnormal   Collection Time: 05/14/15  9:27 PM  Result Value Ref Range   Glucose-Capillary 144 (H) 65 -  99 mg/dL  Basic metabolic panel     Status: Abnormal   Collection Time: 05/15/15  3:50 AM  Result Value Ref Range   Sodium 144 135 - 145 mmol/L   Potassium 5.6 (H) 3.5 - 5.1 mmol/L   Chloride 115 (H) 101 - 111 mmol/L   CO2 21 (L) 22 - 32 mmol/L   Glucose, Bld 117 (H) 65 - 99 mg/dL   BUN 47 (H) 6 - 20 mg/dL   Creatinine, Ser 2.70 (H) 0.61 - 1.24 mg/dL   Calcium 8.4 (L) 8.9 - 10.3 mg/dL   GFR calc non Af Amer 22 (L) >60 mL/min   GFR calc Af Amer 26 (L) >60 mL/min    Comment: (NOTE) The eGFR has been calculated using the CKD EPI equation. This calculation has not been validated in all clinical situations. eGFR's persistently <60 mL/min signify possible Chronic Kidney Disease.    Anion gap 8 5 - 15  CBC     Status: Abnormal   Collection Time: 05/15/15  5:11 AM  Result Value Ref Range   WBC 9.7 4.0 - 10.5 K/uL   RBC 3.13 (L) 4.22 - 5.81 MIL/uL   Hemoglobin 8.4 (L) 13.0 - 17.0 g/dL   HCT 25.5 (L) 39.0 - 52.0 %   MCV 81.5 78.0 - 100.0 fL   MCH 26.8 26.0 - 34.0 pg   MCHC 32.9  30.0 - 36.0 g/dL   RDW 13.7 11.5 - 15.5 %   Platelets 272 150 - 400 K/uL  Glucose, capillary     Status: Abnormal   Collection Time: 05/15/15  7:55 AM  Result Value Ref Range   Glucose-Capillary 107 (H) 65 - 99 mg/dL  Glucose, capillary     Status: Abnormal   Collection Time: 05/15/15 11:16 AM  Result Value Ref Range   Glucose-Capillary 169 (H) 65 - 99 mg/dL  Glucose, capillary     Status: Abnormal   Collection Time: 05/15/15  3:32 PM  Result Value Ref Range   Glucose-Capillary 171 (H) 65 - 99 mg/dL  Glucose, capillary     Status: Abnormal   Collection Time: 05/15/15 10:16 PM  Result Value Ref Range   Glucose-Capillary 229 (H) 65 - 99 mg/dL   Comment 1 Notify RN    Comment 2 Document in Chart   Basic metabolic panel     Status: Abnormal   Collection Time: 05/16/15  3:30 AM  Result Value Ref Range   Sodium 142 135 - 145 mmol/L   Potassium 4.7 3.5 - 5.1 mmol/L    Comment: REPEATED TO VERIFY NO  VISIBLE HEMOLYSIS    Chloride 109 101 - 111 mmol/L   CO2 27 22 - 32 mmol/L   Glucose, Bld 151 (H) 65 - 99 mg/dL   BUN 54 (H) 6 - 20 mg/dL   Creatinine, Ser 2.79 (H) 0.61 - 1.24 mg/dL   Calcium 7.5 (L) 8.9 - 10.3 mg/dL   GFR calc non Af Amer 21 (L) >60 mL/min   GFR calc Af Amer 25 (L) >60 mL/min    Comment: (NOTE) The eGFR has been calculated using the CKD EPI equation. This calculation has not been validated in all clinical situations. eGFR's persistently <60 mL/min signify possible Chronic Kidney Disease.    Anion gap 6 5 - 15  CBC     Status: Abnormal   Collection Time: 05/16/15  3:30 AM  Result Value Ref Range   WBC 10.4 4.0 - 10.5 K/uL   RBC 2.75 (L) 4.22 - 5.81 MIL/uL   Hemoglobin 7.3 (L) 13.0 - 17.0 g/dL   HCT 23.0 (L) 39.0 - 52.0 %   MCV 83.6 78.0 - 100.0 fL   MCH 26.5 26.0 - 34.0 pg   MCHC 31.7 30.0 - 36.0 g/dL   RDW 13.5 11.5 - 15.5 %   Platelets 259 150 - 400 K/uL  Glucose, capillary     Status: Abnormal   Collection Time: 05/16/15  7:24 AM  Result Value Ref Range   Glucose-Capillary 145 (H) 65 - 99 mg/dL  Glucose, capillary     Status: Abnormal   Collection Time: 05/16/15 11:57 AM  Result Value Ref Range   Glucose-Capillary 180 (H) 65 - 99 mg/dL  Glucose, capillary     Status: Abnormal   Collection Time: 05/16/15  3:37 PM  Result Value Ref Range   Glucose-Capillary 161 (H) 65 - 99 mg/dL    No results found.  ROS Blood pressure 152/54, pulse 71, temperature 98.3 F (36.8 C), temperature source Oral, resp. rate 17, height _0  (1.727 m), weight 106.6 kg (235 lb 0.2 oz), SpO2 100 %. Physical Exam  Constitutional: He is oriented to person, place, and time. He appears well-developed and well-nourished.  HENT:  Head: Normocephalic and atraumatic.  Cardiovascular: Normal rate.   Respiratory: Effort normal and breath sounds normal.  GI: Bowel sounds are normal.  Musculoskeletal:  Feet:  Neurological: He is alert and oriented to person, place, and  time.  Psychiatric: He has a normal mood and affect.    Assessment/Plan: Right foot with peripheral vascular disease with a lateral forefoot wound and ischemic 3rd-5th toes 1)  From my standpoint, he likely needs a right foot transmetatarsal amputation.  I have talked with him and his wife at the bedside and plan to do this on Saturday if he is medically stable.  Mcarthur Rossetti 05/16/2015, 7:56 PM

## 2015-05-17 LAB — BASIC METABOLIC PANEL
Anion gap: 7 (ref 5–15)
BUN: 54 mg/dL — AB (ref 6–20)
CALCIUM: 7.6 mg/dL — AB (ref 8.9–10.3)
CHLORIDE: 108 mmol/L (ref 101–111)
CO2: 25 mmol/L (ref 22–32)
CREATININE: 2.67 mg/dL — AB (ref 0.61–1.24)
GFR calc Af Amer: 26 mL/min — ABNORMAL LOW (ref >60)
GFR, EST NON AFRICAN AMERICAN: 23 mL/min — AB (ref >60)
GLUCOSE: 181 mg/dL — AB (ref 65–99)
Potassium: 5.3 mmol/L — ABNORMAL HIGH (ref 3.5–5.1)
Sodium: 140 mmol/L (ref 135–145)

## 2015-05-17 LAB — CBC
HCT: 24 % — ABNORMAL LOW (ref 39.0–52.0)
Hemoglobin: 7.6 g/dL — ABNORMAL LOW (ref 13.0–17.0)
MCH: 26.6 pg (ref 26.0–34.0)
MCHC: 31.7 g/dL (ref 30.0–36.0)
MCV: 83.9 fL (ref 78.0–100.0)
PLATELETS: 266 10*3/uL (ref 150–400)
RBC: 2.86 MIL/uL — ABNORMAL LOW (ref 4.22–5.81)
RDW: 13.2 % (ref 11.5–15.5)
WBC: 11.1 10*3/uL — AB (ref 4.0–10.5)

## 2015-05-17 LAB — HEPARIN LEVEL (UNFRACTIONATED): Heparin Unfractionated: 1.88 IU/mL — ABNORMAL HIGH (ref 0.30–0.70)

## 2015-05-17 LAB — GLUCOSE, CAPILLARY
Glucose-Capillary: 135 mg/dL — ABNORMAL HIGH (ref 65–99)
Glucose-Capillary: 135 mg/dL — ABNORMAL HIGH (ref 65–99)
Glucose-Capillary: 163 mg/dL — ABNORMAL HIGH (ref 65–99)
Glucose-Capillary: 154 mg/dL — ABNORMAL HIGH (ref 65–99)

## 2015-05-17 LAB — APTT: aPTT: 40 seconds — ABNORMAL HIGH (ref 24–37)

## 2015-05-17 MED ORDER — SODIUM POLYSTYRENE SULFONATE 15 GM/60ML PO SUSP
30.0000 g | Freq: Once | ORAL | Status: AC
  Administered 2015-05-17: 30 g via ORAL
  Filled 2015-05-17 (×2): qty 120

## 2015-05-17 MED ORDER — HEPARIN (PORCINE) IN NACL 100-0.45 UNIT/ML-% IJ SOLN
1400.0000 [IU]/h | INTRAMUSCULAR | Status: AC
  Administered 2015-05-17 – 2015-05-18 (×2): 1400 [IU]/h via INTRAVENOUS
  Filled 2015-05-17 (×2): qty 250

## 2015-05-17 NOTE — Care Management Important Message (Signed)
Important Message  Patient Details  Name: Justin HansenDonald Galanti Sr. MRN: [id number] Date of Birth: 12/08/1945   Medicare Important Message Given:  Yes    Haskell FlirtJamison, Aneliese Beaudry 05/17/2015, 12:19 PMImportant Message  Patient Details  Name: Justin HansenDonald Mckay Sr. MRN: [id number] Date of Birth: 07/25/1945   Medicare Important Message Given:  Yes    Haskell FlirtJamison, Audreena Sachdeva 05/17/2015, 12:18 PM

## 2015-05-17 NOTE — Progress Notes (Signed)
[facility] ROUNDING NOTE   Subjective:   Interval History:  BKA planned for Sunday  Objective:  Vital signs in last 24 hours:  Temp:  [97.4 F (36.3 C)-99.9 F (37.7 C)] 99.9 F (37.7 C) (12/09 1149) Pulse Rate:  [70-82] 71 (12/09 1149) Resp:  [13-21] 18 (12/09 1149) BP: (109-153)/(55-70) 137/55 mmHg (12/09 1521) SpO2:  [94 %-99 %] 99 % (12/09 1149)  Weight change:  Filed Weights   05/15/15 0600 05/16/15 0500  Weight: 103.3 kg (227 lb 11.8 oz) 106.6 kg (235 lb 0.2 oz)    Intake/Output: I/O last 3 completed shifts: In: 2900 [I.V.:2700; IV Piggyback:200] Out: 1300 [Urine:1300]   Intake/Output this shift:  Total I/O In: 270 [P.O.:120; I.V.:150] Out: -   CVS- RRR RS- CTA ABD- BS present soft non-distended EXT- no edema   Basic Metabolic Panel:  Recent Labs Lab 05/14/15 1322 05/14/15 1645 05/15/15 0350 05/16/15 0330 05/17/15 0340  NA 140 144  144 144 142 140  K 7.3* 6.5*  6.5* 5.6* 4.7 5.3*  CL 120* 120*  120* 115* 109 108  CO2 16* 19*  18* 21* 27 25  GLUCOSE 97 77  78 117* 151* 181*  BUN 51* 50*  51* 47* 54* 54*  CREATININE 2.60* 2.64*  2.56* 2.70* 2.79* 2.67*  CALCIUM 8.6* 8.8*  8.8* 8.4* 7.5* 7.6*  MG  --  2.3  --   --   --   PHOS  --  5.1*  5.0*  --   --   --     Liver Function Tests:  Recent Labs Lab 05/14/15 1645  ALBUMIN 2.5*   No results for input(s): LIPASE, AMYLASE in the last 168 hours. No results for input(s): AMMONIA in the last 168 hours.  CBC:  Recent Labs Lab 05/14/15 1155 05/14/15 1645 05/15/15 0511 05/16/15 0330 05/17/15 0340  WBC 10.8* 12.3* 9.7 10.4 11.1*  NEUTROABS 8.3* 9.4*  --   --   --   HGB 9.9* 9.0* 8.4* 7.3* 7.6*  HCT 30.2* 28.3* 25.5* 23.0* 24.0*  MCV 83.0 84.2 81.5 83.6 83.9  PLT 338 309 272 259 266    Cardiac Enzymes: No results for input(s): CKTOTAL, CKMB, CKMBINDEX, TROPONINI in the last 168 hours.  BNP: Invalid input(s): POCBNP  CBG:  Recent Labs Lab 05/16/15 1157  05/16/15 1537 05/16/15 2222 05/17/15 0805 05/17/15 1157  GLUCAP 180* 161* 207* 135* 135*    Microbiology: Results for orders placed or performed during the hospital encounter of 05/14/15  Blood culture (routine x 2)     Status: None (Preliminary result)   Collection Time: 05/14/15 11:51 AM  Result Value Ref Range Status   Specimen Description BLOOD RIGHT ANTECUBITAL  Final   Special Requests BOTTLES DRAWN AEROBIC AND ANAEROBIC  Final   Culture   Final    NO GROWTH 3 DAYS Performed at [facility]    Report Status PENDING  Incomplete  Blood culture (routine x 2)     Status: None (Preliminary result)   Collection Time: 05/14/15  1:23 PM  Result Value Ref Range Status   Specimen Description BLOOD LEFT HAND  Final   Special Requests BOTTLES DRAWN AEROBIC AND ANAEROBIC 5CC  Final   Culture   Final    NO GROWTH 3 DAYS Performed at [facility]    Report Status PENDING  Incomplete  MRSA PCR Screening     Status: None   Collection Time: 05/14/15  4:26 PM  Result Value Ref  Range Status   MRSA by PCR NEGATIVE NEGATIVE Final    Comment:        The GeneXpert MRSA Assay (FDA approved for NASAL specimens only), is one component of a comprehensive MRSA colonization surveillance program. It is not intended to diagnose MRSA infection nor to guide or monitor treatment for MRSA infections.     Coagulation Studies: No results for input(s): LABPROT, INR in the last 72 hours.  Urinalysis: No results for input(s): COLORURINE, LABSPEC, PHURINE, GLUCOSEU, HGBUR, BILIRUBINUR, KETONESUR, PROTEINUR, UROBILINOGEN, NITRITE, LEUKOCYTESUR in the last 72 hours.  Invalid input(s): APPERANCEUR    Imaging: No results found.   Medications:   . sodium chloride 75 mL/hr at 05/17/15 0936   . amLODipine  5 mg Oral Daily  . carvedilol  12.5 mg Oral BID WC  . clindamycin (CLEOCIN) IV  600 mg Intravenous Q8H  . cloNIDine  0.1 mg Oral TID  . feeding supplement (PRO-STAT SUGAR  FREE 64)  30 mL Oral Daily  . insulin aspart  0-15 Units Subcutaneous TID WC  . insulin aspart  0-5 Units Subcutaneous QHS  . nutrition supplement (JUVEN)  1 packet Oral BID BM  . sodium chloride  3 mL Intravenous Q12H  . tamsulosin  0.4 mg Oral Daily   acetaminophen **OR** acetaminophen, bisacodyl, HYDROcodone-acetaminophen, hydrOXYzine, LORazepam, ondansetron **OR** ondansetron (ZOFRAN) IV, traMADol  Assessment/ Plan:   Acute on chronic renal disease In setting of low grade and vomiting possible volume depletion Urine in foley bag Condom catheter doubt obstruction  Serial electrolytes  Monitor Is and Os  Renal U/S no hydronephrosis  Hypertension/volume appears volume depleted  change to IV saline  Will have to monitor closely in ICU due to risk of CHF  Rate conservative at 75cc/hr  Hyperkalemia No EKG changes although will proceed with treatment with insulin/ glucose and with IV bicarbonate  better will follow  Metabolic Acidosis improved  Chronic renal disease  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 Plan for BKA  Renal function a little better today    LOS: 3 [name] W @TODAY @4 :45 PM

## 2015-05-17 NOTE — Progress Notes (Signed)
Pt transferred from the ICU. Report received from [place], Charity fundraiserN. AO x 3. PT belongings are with the pt. Pt orientated to the room and made comfortable. No questions or concerns from the pt. Beauty Pless W Najib Colmenares, RN

## 2015-05-17 NOTE — Progress Notes (Signed)
[facility] PROGRESS NOTE  Justin HansenDonald Keidel Sr. ZOX:[id number] DOB: 12/16/1945 DOA: 05/14/2015 PCP: Evlyn CourierHILL,[name], MD  HPI/Brief narrative 69 year old male with past medical history of hypertension, CKD stage 4 (baseline Cr 2.4), left BKA, history of pulmonary embolism (on anticoagulation with apixaban) diabetes mellitus on insulin, recently hospitalized for acute encephalopathy thought to be due to acute infection from presumed UTI as well as possible R foot cellulitis. The patient was admitted for further work up.  Assessment/Plan:  Cellulitis of right foot, 4th toe / Leukocytosis - Right foot 4th toe looks infected. Cellulitis order set placed. Choice of abx clindamycin for now (did not place vanco because of renal failure adn because per order set classification - non purulent moderate disease clinda is one of the choices) - WOC consulted. Recs noted. Also recs to consult Vascular Surgery vs Orthopedic Surgery - R sided abi performed on 12/7 and demonstrated severe loss of flow with abnormal waveforms and flat great toe - Appreciate input by Dr. Hart RochesterLawson. Have discussed case with pt's Orthopod, Dr. Magnus IvanBlackman who plans for surgery Sunday after allowing wash out of eliquis - EKG reviewed with chronic Twave abnormalities, no acute changes. On min O2 support. Vitals stable. Pending correction of lytes and stable hgb, pt would be medically stable for Surgery   Acute encephalopathy - Likely from acute infection, acute on chronic renal failure - Monitor mental status - Stable thus far   S/P BKA (below knee amputation) unilateral (HCC) - Stable    History of pulmonary embolism - Continued apixaban, but will transition to heparin gtt in anticipation for surgery this weekend   Acute renal failure superimposed on stage 4 chronic kidney disease (HCC) / Metabolic acidosis - Baseline Cr 2.4 - Cr on this admission 2.3, likely due to lasix - Lasix currently on hold - Cr currently 2.67 -  Nephrology was consulted and is following. Appreciate input   Hyperkalemia - Due to acute on chronic renal failure as well as potassium supplementation  - Given insulin, calcium gluconate and albuterol in ED - Potassium level had normalized but slightly elevated at 5.3. Will give kayexelate   Uncontrolled diabetes mellitus with circulatory complication, with long-term current use of insulin (HCC) / Diabetic peripheral neuropathy (HCC) - A1c in 2015 was 8 - Check A1c on this admission - On SSI at home. Order placed for SSI.    Anemia of chronic renal failure, stage 4 (severe) (HCC) - Hemoglobin stable - No current indications for transfusion   Benign essential HTN - Continue Norvasc, carvedilol and clonidine   Chronic combined systolic and diastolic CHF (congestive heart failure) (HCC)  - Remains stable, compensated - Last 2 D ECHO in 03/2013 with EF 50% grade 1 diastolic dysfunction - Lasix on hold due to worsening renal function  DVT prophylaxis:  - on therapeutic anticoagulation  Code Status: DNR Family Communication: Pt in room Disposition Plan: transfer to floor   Consultants:  Nephrology - Dr. Webb  Vascular Surgery - Dr. Lawson  Orthopedic Surgery - Dr. Blackman  Procedures:    Antibiotics: Anti-infectives    Start     Dose/Rate Route Frequency Ordered Stop   05/15/15 0200  clindamycin (CLEOCIN) IVPB 600 mg     600 mg 100 mL/hr over 30 Minutes Intravenous Every 8 hours 05/14/15 1826     05/14/15 1445  clindamycin (CLEOCIN) IVPB 600 mg     60 0 mg 100 mL/hr over 30 Minutes Intravenous  Once 05/14/15 1435 05/14/15 1847  HPI/Subjective: No complaints. Eager to go home.  Objective: Filed Vitals:   05/17/15 0600 05/17/15 0700 05/17/15 0800 05/17/15 0900  BP: 148/55 118/62 109/70 130/55  Pulse: 72 74 79 73  Temp:   97.9 F (36.6 C)   TempSrc:   Oral   Resp: Height:      Weight:      SpO2: 97% 97% 98% 99%    Intake/Output  Summary (Last 24 hours) at 05/17/15 1108 Last data filed at 05/17/15 0900  Gross per 24 hour  Intake   1750 ml  Output    825 ml  Net    925 ml   Filed Weights   05/15/15 0600 05/16/15 0500  Weight: 103.3 kg (227 lb 11.8 oz) 106.6 kg (235 lb 0.2 oz)    Exam:   General:  Awake, sitting eating breakfast, in nad  Cardiovascular: regular, s1, s2  Respiratory: normal resp effort, no wheezing  Abdomen: soft, nondistended, pos BS  Musculoskeletal: s/p LLE amputation, RLE with AFO in place   Data Reviewed: Basic Metabolic Panel:  Recent Labs Lab 05/14/15 1322 05/14/15 1645 05/15/15 0350 05/16/15 0330 05/17/15 0340  NA 140 144  144 144 142 140  K 7.3* 6.5*  6.5* 5.6* 4.7 5.3*  CL 120* 120*  120* 115* 109 108  CO2 16* 19*  18* 21* 27 25  GLUCOSE 97 77  78 117* 151* 181*  BUN 51* 50*  51* 47* 54* 54*  CREATININE 2.60* 2.64*  2.56* 2.70* 2.79* 2.67*  CALCIUM 8.6* 8.8*  8.8* 8.4* 7.5* 7.6*  MG  --  2.3  --   --   --   PHOS  --  5.1*  5.0*  --   --   --    Liver Function Tests:  Recent Labs Lab 05/14/15 1645  ALBUMIN 2.5*   No results for input(s): LIPASE, AMYLASE in the last 168 hours. No results for input(s): AMMONIA in the last 168 hours. CBC:  Recent Labs Lab 05/14/15 1155 05/14/15 1645 05/15/15 0511 05/16/15 0330 05/17/15 0340  WBC 10.8* 12.3* 9.7 10.4 11.1*  NEUTROABS 8.3* 9.4*  --   --   --   HGB 9.9* 9.0* 8.4* 7.3* 7.6*  HCT 30.2* 28.3* 25.5* 23.0* 24.0*  MCV 83.0 84.2 81.5 83.6 83.9  PLT 338 309 272 259 266   Cardiac Enzymes: No results for input(s): CKTOTAL, CKMB, CKMBINDEX, TROPONINI in the last 168 hours. BNP (last 3 results)  Recent Labs  04/05/15 1439  BNP 175.9*    ProBNP (last 3 results) No results for input(s): PROBNP in the last 8760 hours.  CBG:  Recent Labs Lab 05/16/15 0724 05/16/15 1157 05/16/15 1537 05/16/15 2222 05/17/15 0805  GLUCAP 145* 180* 161* 207* 135*    Recent Results (from the past 240 hour(s))   Blood culture (routine x 2)     Status: None (Preliminary result)   Collection Time: 05/14/15 11:51 AM  Result Value Ref Range Status   Specimen Description BLOOD RIGHT ANTECUBITAL  Final   Special Requests BOTTLES DRAWN AEROBIC AND ANAEROBIC  Final   Culture   Final    NO GROWTH 2 DAYS Performed at [facility]    Report Status PENDING  Incomplete  Blood culture (routine x 2)     Status: None (Preliminary result)   Collection Time: 05/14/15  1:23 PM  Result Value Ref Range Status   Specimen Description BLOOD LEFT HAND  Final  Special Requests BOTTLES DRAWN AEROBIC AND ANAEROBIC 5CC  Final   Culture   Final    NO GROWTH 2 DAYS Performed at [facility]    Report Status PENDING  Incomplete  MRSA PCR Screening     Status: None   Collection Time: 05/14/15  4:26 PM  Result Value Ref Range Status   MRSA by PCR NEGATIVE NEGATIVE Final    Comment:        The GeneXpert MRSA Assay (FDA approved for NASAL specimens only), is one component of a comprehensive MRSA colonization surveillance program. It is not intended to diagnose MRSA infection nor to guide or monitor treatment for MRSA infections.      Studies: No results found.  Scheduled Meds: . amLODipine  5 mg Oral Daily  . carvedilol  12.5 mg Oral BID WC  . clindamycin (CLEOCIN) IV  600 mg Intravenous Q8H  . cloNIDine  0.1 mg Oral TID  . feeding supplement (PRO-STAT SUGAR FREE 64)  30 mL Oral Daily  . insulin aspart  0-15 Units Subcutaneous TID WC  . insulin aspart  0-5 Units Subcutaneous QHS  . nutrition supplement (JUVEN)  1 packet Oral BID BM  . sodium chloride  3 mL Intravenous Q12H  . tamsulosin  0.4 mg Oral Daily   Continuous Infusions: . sodium chloride 75 mL/hr at 05/17/15 1610    Principal Problem:   Cellulitis of fourth toe of right foot Active Problems:   Diabetic peripheral neuropathy (HCC)   Acute encephalopathy   S/P BKA (below knee amputation) unilateral (HCC)   History of  pulmonary embolism   Acute renal failure superimposed on stage 4 chronic kidney disease (HCC)   Hyperkalemia   Metabolic acidosis   Uncontrolled diabetes mellitus with circulatory complication, with long-term current use of insulin (HCC)   Anemia of chronic renal failure, stage 4 (severe) (HCC)   Benign essential HTN   Chronic combined systolic and diastolic CHF (congestive heart failure) (HCC)   Leukocytosis   CHIU, STEPHEN K  [facility] Pager [phone]. If 7PM-7AM, please contact night-coverage at [email or web address], password [facility] 05/17/2015, 11:08 AM  LOS: 3 days

## 2015-05-17 NOTE — Progress Notes (Signed)
ANTICOAGULATION CONSULT NOTE - Initial Consult  Pharmacy Consult for Heparin Indication: chronic Apixaban for hx of PE, IV Heparin for surgery  Allergies  Allergen Reactions  . Ancef [Cefazolin] Itching and Rash  . Sulfa Antibiotics Itching and Rash   Patient Measurements: Height: 5\' 8"  (172.7 cm) Weight: 235 lb 0.2 oz (106.6 kg) IBW/kg (Calculated) : 68.4 Heparin Dosing Weight: 92kg  Vital Signs: Temp: 99.9 F (37.7 C) (12/09 1149) Temp Source: Oral (12/09 1149) BP: 125/58 mmHg (12/09 1149) Pulse Rate: 71 (12/09 1149)  Labs:  Recent Labs  05/14/15 1645 05/15/15 0350 05/15/15 0511 05/16/15 0330 05/17/15 0340  HGB 9.0*  --  8.4* 7.3* 7.6*  HCT 28.3*  --  25.5* 23.0* 24.0*  PLT 309  --  272 259 266  APTT 41*  --   --   --   --   LABPROT 17.4*  --   --   --   --   INR 1.41  --   --   --   --   CREATININE 2.64*  2.56* 2.70*  --  2.79* 2.67*   Estimated Creatinine Clearance: 30.5 mL/min (by C-G formula based on Cr of 2.67).  Medical History: History reviewed. No pertinent past medical history.  Medications:  Scheduled:  . amLODipine  5 mg Oral Daily  . carvedilol  12.5 mg Oral BID WC  . clindamycin (CLEOCIN) IV  600 mg Intravenous Q8H  . cloNIDine  0.1 mg Oral TID  . feeding supplement (PRO-STAT SUGAR FREE 64)  30 mL Oral Daily  . insulin aspart  0-15 Units Subcutaneous TID WC  . insulin aspart  0-5 Units Subcutaneous QHS  . nutrition supplement (JUVEN)  1 packet Oral BID BM  . sodium chloride  3 mL Intravenous Q12H  . tamsulosin  0.4 mg Oral Daily   Infusions:  . sodium chloride 75 mL/hr at 05/17/15 [id number]   Assessment: 69 yoM admitted for RLE infection, presumed UTI, associated encephalopathy. Previous L BKA, RLE ABI's with severe loss of flow.  Ortho planning surgery Saturday, other notes state Sunday for R transmetatarsal amputation.  Therapy complicated by chronic anti-coagulation with Apixaban for hx of PE and poor renal function. Last Apixaban dose  given this am 12/9, otherwise could possibly have started Heparin this am.   Will order baseline coags for this evening, Heparin rate based on levels, no bolus.  Goal of Therapy:  Heparin level 0.3-0.7 units/ml aPTT 66-102 sec seconds Monitor platelets by anticoagulation protocol: Yes   Plan:   Obtain baseline aPTT and Heparin level at 8pm, Heparin rate based on results, correlation of levels  No bolus Heparin  Surgery not yet scheduled  Otho BellowsGreen, Maridel Pixler L PharmD Pager [phone] 05/17/2015, 12:42 PM

## 2015-05-17 NOTE — Progress Notes (Signed)
ANTICOAGULATION CONSULT NOTE - Follow Up  Pharmacy Consult for Heparin Indication: chronic Apixaban for hx of PE, IV Heparin for surgery  Allergies  Allergen Reactions  . Ancef [Cefazolin] Itching and Rash  . Sulfa Antibiotics Itching and Rash   Patient Measurements: Height: 5\' 8"  (172.7 cm) Weight: 235 lb 0.2 oz (106.6 kg) IBW/kg (Calculated) : 68.4 Heparin Dosing Weight: 92kg  Vital Signs: Temp: 99.9 F (37.7 C) (12/09 1149) Temp Source: Oral (12/09 1149) BP: 137/55 mmHg (12/09 1521) Pulse Rate: 71 (12/09 1149)  Labs:  Recent Labs  05/15/15 0350  05/15/15 0511 05/16/15 0330 05/17/15 0340  HGB  --   < > 8.4* 7.3* 7.6*  HCT  --   --  25.5* 23.0* 24.0*  PLT  --   --  272 259 266  CREATININE 2.70*  --   --  2.79* 2.67*  < > = values in this interval not displayed. Estimated Creatinine Clearance: 30.5 mL/min (by C-G formula based on Cr of 2.67).  Medical History: History reviewed. No pertinent past medical history.  Medications:  Scheduled:  . amLODipine  5 mg Oral Daily  . carvedilol  12.5 mg Oral BID WC  . clindamycin (CLEOCIN) IV  600 mg Intravenous Q8H  . cloNIDine  0.1 mg Oral TID  . feeding supplement (PRO-STAT SUGAR FREE 64)  30 mL Oral Daily  . insulin aspart  0-15 Units Subcutaneous TID WC  . insulin aspart  0-5 Units Subcutaneous QHS  . nutrition supplement (JUVEN)  1 packet Oral BID BM  . sodium chloride  3 mL Intravenous Q12H  . tamsulosin  0.4 mg Oral Daily   Infusions:  . sodium chloride 75 mL/hr at 05/17/15 [id number]   Assessment: 69 yoM admitted for RLE infection, presumed UTI, associated encephalopathy. Previous L BKA, RLE ABI's with severe loss of flow.  Ortho planning surgery Saturday, other notes state Sunday for R transmetatarsal amputation.  Therapy complicated by chronic anti-coagulation with Apixaban for hx of PE and poor renal function. Last Apixaban dose given this am 12/9, otherwise could possibly have started Heparin this am.   Will  order baseline coags for this evening, Heparin rate based on levels, no bolus.  Goal of Therapy:  Heparin level 0.3-0.7 units/ml aPTT 66-102 sec seconds Monitor platelets by anticoagulation protocol: Yes   Plan:  1) Baseline coags have been drawn 2) Start IV heparin at 9pm - no bolus then start rate of 1400 units/hr 3) Check aPTT and heparin level 6 hour after heparin started 4) Daily heparin level and aPTT for now until the two correlate 5) Once aPTT and heparin levels correlate, will check only heparin levels   Hessie KnowsJustin M Kyson Kupper, PharmD, BCPS Pager [phone] 05/17/2015 8:38 PM

## 2015-05-18 LAB — BASIC METABOLIC PANEL
ANION GAP: 7 (ref 5–15)
BUN: 49 mg/dL — ABNORMAL HIGH (ref 6–20)
CHLORIDE: 111 mmol/L (ref 101–111)
CO2: 23 mmol/L (ref 22–32)
Calcium: 7.4 mg/dL — ABNORMAL LOW (ref 8.9–10.3)
Creatinine, Ser: 2.65 mg/dL — ABNORMAL HIGH (ref 0.61–1.24)
GFR calc Af Amer: 26 mL/min — ABNORMAL LOW (ref >60)
GFR, EST NON AFRICAN AMERICAN: 23 mL/min — AB (ref >60)
Glucose, Bld: 182 mg/dL — ABNORMAL HIGH (ref 65–99)
POTASSIUM: 4.6 mmol/L (ref 3.5–5.1)
SODIUM: 141 mmol/L (ref 135–145)

## 2015-05-18 LAB — PREPARE RBC (CROSSMATCH)

## 2015-05-18 LAB — CBC
HEMATOCRIT: 22.9 % — AB (ref 39.0–52.0)
HEMOGLOBIN: 7.4 g/dL — AB (ref 13.0–17.0)
MCH: 27.5 pg (ref 26.0–34.0)
MCHC: 32.3 g/dL (ref 30.0–36.0)
MCV: 85.1 fL (ref 78.0–100.0)
PLATELETS: 267 10*3/uL (ref 150–400)
RBC: 2.69 MIL/uL — ABNORMAL LOW (ref 4.22–5.81)
RDW: 13.4 % (ref 11.5–15.5)
WBC: 11.4 10*3/uL — AB (ref 4.0–10.5)

## 2015-05-18 LAB — GLUCOSE, CAPILLARY
GLUCOSE-CAPILLARY: 149 mg/dL — AB (ref 65–99)
Glucose-Capillary: 341 mg/dL — ABNORMAL HIGH (ref 65–99)
Glucose-Capillary: 177 mg/dL — ABNORMAL HIGH (ref 65–99)
Glucose-Capillary: 170 mg/dL — ABNORMAL HIGH (ref 65–99)

## 2015-05-18 LAB — HEPARIN LEVEL (UNFRACTIONATED)
HEPARIN UNFRACTIONATED: 1.5 [IU]/mL — AB (ref 0.30–0.70)
Heparin Unfractionated: 1.1 IU/mL — ABNORMAL HIGH (ref 0.30–0.70)

## 2015-05-18 LAB — ABO/RH: ABO/RH(D): B POS

## 2015-05-18 LAB — APTT
APTT: 75 s — AB (ref 24–37)
APTT: 80 s — AB (ref 24–37)
aPTT: 86 s — ABNORMAL HIGH (ref 24–37)

## 2015-05-18 MED ORDER — FUROSEMIDE 80 MG PO TABS
80.0000 mg | ORAL_TABLET | Freq: Two times a day (BID) | ORAL | Status: DC
  Administered 2015-05-18 – 2015-05-19 (×3): 80 mg via ORAL
  Filled 2015-05-18 (×4): qty 1

## 2015-05-18 MED ORDER — SODIUM CHLORIDE 0.9 % IV SOLN
Freq: Once | INTRAVENOUS | Status: AC
  Administered 2015-05-18: 11:00:00 via INTRAVENOUS

## 2015-05-18 MED ORDER — SODIUM CHLORIDE 0.9 % IJ SOLN
10.0000 mL | INTRAMUSCULAR | Status: DC | PRN
Start: 2015-05-18 — End: 2015-05-24
  Administered 2015-05-19 – 2015-05-21 (×2): 10 mL
  Filled 2015-05-18 (×2): qty 40

## 2015-05-18 NOTE — Progress Notes (Addendum)
  [facility] Progress Note   Subjective: no compliants  Filed Vitals:   05/17/15 1149 05/17/15 1521 05/17/15 2228 05/18/15 0500  BP: 125/58 137/55 142/59 134/54  Pulse: 71  75 77  Temp: 99.9 F (37.7 C)  99.5 F (37.5 C) 98.8 F (37.1 C)  TempSrc: Oral  Axillary Oral  Resp: $Remo'18  18 18  'Yjtmq$ Height:      Weight:    132.133 kg (291 lb 4.8 oz)  SpO2: 99%  98% 98%    Inpatient medications: . amLODipine  5 mg Oral Daily  . carvedilol  12.5 mg Oral BID WC  . clindamycin (CLEOCIN) IV  600 mg Intravenous Q8H  . cloNIDine  0.1 mg Oral TID  . feeding supplement (PRO-STAT SUGAR FREE 64)  30 mL Oral Daily  . insulin aspart  0-15 Units Subcutaneous TID WC  . insulin aspart  0-5 Units Subcutaneous QHS  . nutrition supplement (JUVEN)  1 packet Oral BID BM  . sodium chloride  3 mL Intravenous Q12H  . tamsulosin  0.4 mg Oral Daily   . sodium chloride 75 mL/hr at 05/17/15 2123  . heparin 1,400 Units/hr (05/18/15 1323)   acetaminophen **OR** acetaminophen, bisacodyl, HYDROcodone-acetaminophen, hydrOXYzine, LORazepam, ondansetron **OR** ondansetron (ZOFRAN) IV, traMADol  Exam: Elderly AAM no distress, gen weakness No jvd Chest dec'd bilat bases RRR no mrg Abd obese soft ntnd +bs 2-3+ edema bilat hips/ LE's  R leg in brace, R foot wrapped L BKA Neuro bed bound, gen weakness, nonfocal  CXR 10/28 no edema I/O 6L in and 3L out since admitted Last ECHO 2014  LVEF 50-55%     Assessment: 1 CKD stage 3/4 baseline creat 1.5 - 2.8 (quite variable over last 3 yrs) 2 Vol excess- is gaining vol / edema 3 PVD for R BKA tomorrow 4 Debil lives in SNF / hx L BKA 5 Met acidosis better 6 DNR  Plan - po lasix 80 bid, dc IVF's   Kelly Splinter MD [facility] pager [phone]    cell [phone] 05/18/2015, 3:36 PM    Recent Labs Lab 05/14/15 1645  05/16/15 0330 05/17/15 0340 05/18/15 0306  NA 144  144  < > 142 140 141  K 6.5*  6.5*  < > 4.7 5.3* 4.6  CL 120*   120*  < > 109 108 111  CO2 19*  18*  < > $R'27 25 23  'ww$ GLUCOSE 77  78  < > 151* 181* 182*  BUN 50*  51*  < > 54* 54* 49*  CREATININE 2.64*  2.56*  < > 2.79* 2.67* 2.65*  CALCIUM 8.8*  8.8*  < > 7.5* 7.6* 7.4*  PHOS 5.1*  5.0*  --   --   --   --   < > = values in this interval not displayed.  Recent Labs Lab 05/14/15 1645  ALBUMIN 2.5*    Recent Labs Lab 05/14/15 1155 05/14/15 1645  05/16/15 0330 05/17/15 0340 05/18/15 0306  WBC 10.8* 12.3*  < > 10.4 11.1* 11.4*  NEUTROABS 8.3* 9.4*  --   --   --   --   HGB 9.9* 9.0*  < > 7.3* 7.6* 7.4*  HCT 30.2* 28.3*  < > 23.0* 24.0* 22.9*  MCV 83.0 84.2  < > 83.6 83.9 85.1  PLT 338 309  < > 259 266 267  < > = values in this interval not displayed.

## 2015-05-18 NOTE — Progress Notes (Signed)
ANTICOAGULATION CONSULT NOTE - Follow Up  Pharmacy Consult for Heparin Indication: chronic Apixaban for hx of PE, IV Heparin for surgery  Allergies  Allergen Reactions  . Ancef [Cefazolin] Itching and Rash  . Sulfa Antibiotics Itching and Rash   Patient Measurements: Height: 5\' 8"  (172.7 cm) Weight: 291 lb 4.8 oz (132.133 kg) IBW/kg (Calculated) : 68.4 Heparin Dosing Weight: 92kg  Vital Signs: Temp: 98.8 F (37.1 C) (12/10 0500) Temp Source: Oral (12/10 0500) BP: 134/54 mmHg (12/10 0500) Pulse Rate: 77 (12/10 0500)  Labs:  Recent Labs  05/16/15 0330 05/17/15 0340 05/17/15 2023 05/18/15 0306 05/18/15 1220  HGB 7.3* 7.6*  --  7.4*  --   HCT 23.0* 24.0*  --  22.9*  --   PLT 259 266  --  267  --   APTT  --   --  40* 75* 86*  HEPARINUNFRC  --   --  1.88* 1.50*  --   CREATININE 2.79* 2.67*  --  2.65*  --    Estimated Creatinine Clearance: 34.4 mL/min (by C-G formula based on Cr of 2.65).  Medical History: History reviewed. No pertinent past medical history.  Medications:  Scheduled:  . amLODipine  5 mg Oral Daily  . carvedilol  12.5 mg Oral BID WC  . clindamycin (CLEOCIN) IV  600 mg Intravenous Q8H  . cloNIDine  0.1 mg Oral TID  . feeding supplement (PRO-STAT SUGAR FREE 64)  30 mL Oral Daily  . insulin aspart  0-15 Units Subcutaneous TID WC  . insulin aspart  0-5 Units Subcutaneous QHS  . nutrition supplement (JUVEN)  1 packet Oral BID BM  . sodium chloride  3 mL Intravenous Q12H  . tamsulosin  0.4 mg Oral Daily   Infusions:  . sodium chloride 75 mL/hr at 05/17/15 2123  . heparin 1,400 Units/hr (05/18/15 1323)   Assessment: [name] admitted for RLE infection, presumed UTI, associated encephalopathy. Previous L BKA, RLE ABI's with severe loss of flow.  Ortho planning surgery Saturday, other notes state Sunday for R transmetatarsal amputation.  Therapy complicated by chronic anti-coagulation with Apixaban for hx of PE and poor renal function. Last Apixaban dose  given AM of 12/9  Baseline coags prior to starting heparin: aPTT = 40 sec, anti Xa level = 1.88  Baseline Hgb = 7.6, platelets 266  Today, 05/18/2015  APTT therapeutic (86 seconds) on heparin 1400 units/hr  Anti Xa level high, but likely drug-lab interaction between rivaroxaban and anti-Xa level (aka heparin level) therefore, using aPTT for now  Hgb low but unchanged from admission, transfusion ordered since surgery planned for 12/11  No bleeding reported per notes  Plan for right foot transmetatarsal amputation tomorrow 12/11  Goal of Therapy:  Heparin level 0.3-0.7 units/ml aPTT 66-102 sec seconds Monitor platelets by anticoagulation protocol: Yes   Plan:   Continue heparin at 1400 units/hr  Check aPTT in 8 hrs to verify therapeutic  Follow up with Ortho for when heparin should be held prior to surgery  Daily heparin level and aPTT for now until the two correlate  Once aPTT and heparin levels correlate, will check only heparin levels   Loralee PacasErin Kinsey Karch, PharmD, BCPS Pager: [phone]  05/18/2015 1:59 PM

## 2015-05-18 NOTE — Progress Notes (Signed)
Patient ID: Justin HansenDonald Vanatta Sr., male   DOB: 02/17/1946, 69 y.o.   MRN: [id number] I have spoken to the patient and his wife in detail at the bedside and they have agreed on surgery for tomorrow 12/11.  It will be for a right foot transmetatarsal amputation vs removing just his 3rd-5th toes.  This will depend on soft-tissue coverage and interoperative findings, which they understand fully.

## 2015-05-18 NOTE — Progress Notes (Signed)
[facility] PROGRESS NOTE  Justin HansenDonald Backstrom Sr. ZOX:[id number] DOB: 08/16/1945 DOA: 05/14/2015 PCP: Evlyn CourierHILL,[name], MD  HPI/Brief narrative 69 year old male with past medical history of hypertension, CKD stage 4 (baseline Cr 2.4), left BKA, history of pulmonary embolism (on anticoagulation with apixaban) diabetes mellitus on insulin, recently hospitalized for acute encephalopathy thought to be due to acute infection from presumed UTI as well as possible R foot cellulitis. The patient was admitted for further work up.  Assessment/Plan:  Cellulitis of right foot, 4th toe / Leukocytosis - R 4th and 5th digits with necrosis and concerns for underlying infection. Cellulitis order set placed. Choice of abx clindamycin for now (did not place vanco because of renal failure adn because per order set classification - non purulent moderate disease clinda is one of the choices) - WOC consulted. Recs noted. Also recs to consult Vascular Surgery vs Orthopedic Surgery - R sided abi performed on 12/7 and demonstrated severe loss of flow with abnormal waveforms and flat great toe - Consulted Dr. Magnus IvanBlackman who plans for surgery 12/11 after allowing wash out of eliquis - EKG reviewed with chronic Twave abnormalities, no acute changes. On min O2 support. Vitals stable. Lytes now corrected. At this point, benefits to surgery would outweigh risks   Acute encephalopathy - Likely from acute infection, acute on chronic renal failure - Monitor mental status - Stable thus far   S/P BKA (below knee amputation) unilateral (HCC) - Stable    History of pulmonary embolism - Continued apixaban, but have transitioned to heparin gtt in anticipation of upcoming surgery   Acute renal failure superimposed on stage 4 chronic kidney disease (HCC) / Metabolic acidosis - Baseline Cr 2.4 - Cr on this admission 2.3, likely due to lasix - Lasix currently on hold - Cr currently 2.65 - Nephrology was consulted and is  following. Appreciate input   Hyperkalemia - Due to acute on chronic renal failure as well as potassium supplementation  - Given insulin, calcium gluconate and albuterol in ED - Potassium level currently normal   Uncontrolled diabetes mellitus with circulatory complication, with long-term current use of insulin (HCC) / Diabetic peripheral neuropathy (HCC) - A1c in 2015 was 8 - Check A1c on this admission - On SSI at home. Order placed for SSI.    Anemia of chronic renal failure, stage 4 (severe) (HCC) - Hemoglobin overall stable at 7.4 - Given upcoming surgery, will give one unit of PRBC   Benign essential HTN - Continue Norvasc, carvedilol and clonidine   Chronic combined systolic and diastolic CHF (congestive heart failure) (HCC)  - Remains stable, compensated - Last 2 D ECHO in 03/2013 with EF 50% grade 1 diastolic dysfunction - Lasix on hold due to worsening renal function  DVT prophylaxis:  - on therapeutic anticoagulation  Code Status: DNR Family Communication: Pt in room Disposition Plan: transfer to floor   Consultants:  Nephrology - Dr. Hyman HopesWebb  Vascular Surgery - Dr. Hart RochesterLawson  Orthopedic Surgery - Dr. Magnus IvanBlackman  Procedures:    Antibiotics: Anti-infectives    Start     Dose/Rate Route Frequency Ordered Stop   05/15/15 0200  clindamycin (CLEOCIN) IVPB 600 mg     600 mg 100 mL/hr over 30 Minutes Intravenous Every 8 hours 05/14/15 1826     05/14/15 1445  clindamycin (CLEOCIN) IVPB 600 mg     600 mg 100 mL/hr over 30 Minutes Intravenous  Once 05/14/15 1435 05/14/15 1847      HPI/Subjective: Patient is without complaints today  Objective: Filed Vitals:   05/17/15 1149 05/17/15 1521 05/17/15 2228 05/18/15 0500  BP: 125/58 137/55 142/59 134/54  Pulse: 71  75 77  Temp: 99.9 F (37.7 C)  99.5 F (37.5 C) 98.8 F (37.1 C)  TempSrc: Oral  Axillary Oral  Resp: Height:      Weight:    132.133 kg (291 lb 4.8 oz)  SpO2: 99%  98% 98%     Intake/Output Summary (Last 24 hours) at 05/18/15 1350 Last data filed at 05/18/15 0800  Gross per 24 hour  Intake    240 ml  Output   1250 ml  Net  -1010 ml   Filed Weights   05/15/15 0600 05/16/15 0500 05/18/15 0500  Weight: 103.3 kg (227 lb 11.8 oz) 106.6 kg (235 lb 0.2 oz) 132.133 kg (291 lb 4.8 oz)    Exam:   General:  Awake, sitting in bed, in nad  Cardiovascular: regular, s1, s2  Respiratory: normal resp effort, no wheezing  Abdomen: soft, nondistended, pos BS  Musculoskeletal: s/p LLE amputation, RLE with AFO in place   Data Reviewed: Basic Metabolic Panel:  Recent Labs Lab 05/14/15 1645 05/15/15 0350 05/16/15 0330 05/17/15 0340 05/18/15 0306  NA 144  144 144 142 140 141  K 6.5*  6.5* 5.6* 4.7 5.3* 4.6  CL 120*  120* 115* 109 108 111  CO2 19*  18* 21* GLUCOSE 77  78 117* 151* 181* 182*  BUN 50*  51* 47* 54* 54* 49*  CREATININE 2.64*  2.56* 2.70* 2.79* 2.67* 2.65*  CALCIUM 8.8*  8.8* 8.4* 7.5* 7.6* 7.4*  MG 2.3  --   --   --   --   PHOS 5.1*  5.0*  --   --   --   --    Liver Function Tests:  Recent Labs Lab 05/14/15 1645  ALBUMIN 2.5*   No results for input(s): LIPASE, AMYLASE in the last 168 hours. No results for input(s): AMMONIA in the last 168 hours. CBC:  Recent Labs Lab 05/14/15 1155 05/14/15 1645 05/15/15 0511 05/16/15 0330 05/17/15 0340 05/18/15 0306  WBC 10.8* 12.3* 9.7 10.4 11.1* 11.4*  NEUTROABS 8.3* 9.4*  --   --   --   --   HGB 9.9* 9.0* 8.4* 7.3* 7.6* 7.4*  HCT 30.2* 28.3* 25.5* 23.0* 24.0* 22.9*  MCV 83.0 84.2 81.5 83.6 83.9 85.1  PLT 338 309 272 259 266 267   Cardiac Enzymes: No results for input(s): CKTOTAL, CKMB, CKMBINDEX, TROPONINI in the last 168 hours. BNP (last 3 results)  Recent Labs  04/05/15 1439  BNP 175.9*    ProBNP (last 3 results) No results for input(s): PROBNP in the last 8760 hours.  CBG:  Recent Labs Lab 05/17/15 1157 05/17/15 1651 05/17/15 2225 05/18/15 0738  05/18/15 1147  GLUCAP 135* 163* 154* 341* 177*    Recent Results (from the past 240 hour(s))  Blood culture (routine x 2)     Status: None (Preliminary result)   Collection Time: 05/14/15 11:51 AM  Result Value Ref Range Status   Specimen Description BLOOD RIGHT ANTECUBITAL  Final   Special Requests BOTTLES DRAWN AEROBIC AND ANAEROBIC  Final   Culture   Final    NO GROWTH 4 DAYS Performed at [facility]    Report Status PENDING  Incomplete  Blood culture (routine x 2)     Status: None (Preliminary result)   Collection Time: 05/14/15  1:23 PM  Result Value Ref Range Status   Specimen Description BLOOD LEFT HAND  Final   Special Requests BOTTLES DRAWN AEROBIC AND ANAEROBIC 5CC  Final   Culture   Final    NO GROWTH 4 DAYS Performed at [facility]    Report Status PENDING  Incomplete  MRSA PCR Screening     Status: None   Collection Time: 05/14/15  4:26 PM  Result Value Ref Range Status   MRSA by PCR NEGATIVE NEGATIVE Final    Comment:        The GeneXpert MRSA Assay (FDA approved for NASAL specimens only), is one component of a comprehensive MRSA colonization surveillance program. It is not intended to diagnose MRSA infection nor to guide or monitor treatment for MRSA infections.      Studies: No results found.  Scheduled Meds: . amLODipine  5 mg Oral Daily  . carvedilol  12.5 mg Oral BID WC  . clindamycin (CLEOCIN) IV  600 mg Intravenous Q8H  . cloNIDine  0.1 mg Oral TID  . feeding supplement (PRO-STAT SUGAR FREE 64)  30 mL Oral Daily  . insulin aspart  0-15 Units Subcutaneous TID WC  . insulin aspart  0-5 Units Subcutaneous QHS  . nutrition supplement (JUVEN)  1 packet Oral BID BM  . sodium chloride  3 mL Intravenous Q12H  . tamsulosin  0.4 mg Oral Daily   Continuous Infusions: . sodium chloride 75 mL/hr at 05/17/15 2123  . heparin 1,400 Units/hr (05/18/15 1323)    Principal Problem:   Cellulitis of fourth toe of right foot Active  Problems:   Diabetic peripheral neuropathy (HCC)   Acute encephalopathy   S/P BKA (below knee amputation) unilateral (HCC)   History of pulmonary embolism   Acute renal failure superimposed on stage 4 chronic kidney disease (HCC)   Hyperkalemia   Metabolic acidosis   Uncontrolled diabetes mellitus with circulatory complication, with long-term current use of insulin (HCC)   Anemia of chronic renal failure, stage 4 (severe) (HCC)   Benign essential HTN   Chronic combined systolic and diastolic CHF (congestive heart failure) (HCC)   Leukocytosis   CHIU, STEPHEN K  [facility] Pager [phone]. If 7PM-7AM, please contact night-coverage at [email or web address], password [facility] 05/18/2015, 1:50 PM  LOS: 4 days

## 2015-05-18 NOTE — Progress Notes (Signed)
Peripherally Inserted Central Catheter/Midline Placement  The IV Nurse has discussed with the patient and/or persons authorized to consent for the patient, the purpose of this procedure and the potential benefits and risks involved with this procedure.  The benefits include less needle sticks, lab draws from the catheter and patient may be discharged home with the catheter.  Risks include, but not limited to, infection, bleeding, blood clot (thrombus formation), and puncture of an artery; nerve damage and irregular heat beat.  Alternatives to this procedure were also discussed. Wife at bedside signed consent.  PICC/Midline Placement Documentation  PICC Double Lumen 05/18/15 PICC Right Cephalic 45 cm 0 cm (Active)  Indication for Insertion or Continuance of Line Prolonged intravenous therapies;Limited venous access - need for IV therapy >5 days (PICC only) 05/18/2015  6:00 PM  Exposed Catheter (cm) 0 cm 05/18/2015  6:00 PM  Site Assessment Clean;Dry;Intact 05/18/2015  6:00 PM  Lumen #1 Status Flushed;Saline locked;Blood return noted 05/18/2015  6:00 PM  Lumen #2 Status Flushed;Saline locked;Blood return noted 05/18/2015  6:00 PM  Dressing Type Transparent 05/18/2015  6:00 PM  Dressing Status Clean;Dry;Intact;Antimicrobial disc in place 05/18/2015  6:00 PM  Line Care Connections checked and tightened 05/18/2015  6:00 PM  Line Adjustment (NICU/IV Team Only) No 05/18/2015  6:00 PM  Dressing Intervention New dressing 05/18/2015  6:00 PM  Dressing Change Due 05/25/15 05/18/2015  6:00 PM       Elliot DallyRiggs, Camilo Mander Wright 05/18/2015, 6:21 PM

## 2015-05-18 NOTE — Progress Notes (Signed)
Dr Arlean HoppingSchertz paged regarding PICC order and renal status.  States OK to proceed with PICC.

## 2015-05-18 NOTE — Progress Notes (Signed)
ANTICOAGULATION CONSULT NOTE - Follow Up Consult  Pharmacy Consult for Heparin Indication: chronic Apixaban for hx of PE, IV Heparin for surgery  Allergies  Allergen Reactions  . Ancef [Cefazolin] Itching and Rash  . Sulfa Antibiotics Itching and Rash    Patient Measurements: Height: 5\' 8"  (172.7 cm) Weight: 235 lb 0.2 oz (106.6 kg) IBW/kg (Calculated) : 68.4 Heparin Dosing Weight:   Vital Signs: Temp: 99.5 F (37.5 C) (12/09 2228) Temp Source: Axillary (12/09 2228) BP: 142/59 mmHg (12/09 2228) Pulse Rate: 75 (12/09 2228)  Labs:  Recent Labs  05/16/15 0330 05/17/15 0340 05/17/15 2023 05/18/15 0306  HGB 7.3* 7.6*  --  7.4*  HCT 23.0* 24.0*  --  22.9*  PLT 259 266  --  267  APTT  --   --  40* 75*  HEPARINUNFRC  --   --  1.88* 1.50*  CREATININE 2.79* 2.67*  --  2.65*    Estimated Creatinine Clearance: 30.7 mL/min (by C-G formula based on Cr of 2.65).   Medications:  Infusions:  . sodium chloride 75 mL/hr at 05/17/15 2123  . heparin 1,400 Units/hr (05/17/15 2123)    Assessment: Patient with PTT at goal. Heparin level high, but believe this is a lab/drug effect and not a true level.  PTT ordered with Heparin level until both correlate due to possible drug-lab interaction between rivaroxaban and anti-Xa level (aka heparin level) Will use for PTT for now. No heparin issues noted.  Goal of Therapy:  Heparin level 0.3-0.7 units/ml aPTT 66-102 seconds Monitor platelets by anticoagulation protocol: Yes   Plan:  Continue heparin drip at current rate Recheck level and PTT at 1200  Darlina GuysGrimsley Jr, Jacquenette ShoneJulian Crowford 05/18/2015,5:51 AM

## 2015-05-19 ENCOUNTER — Inpatient Hospital Stay (HOSPITAL_COMMUNITY): Payer: Medicare Other | Admitting: Anesthesiology

## 2015-05-19 ENCOUNTER — Encounter (HOSPITAL_COMMUNITY): Payer: Self-pay

## 2015-05-19 ENCOUNTER — Encounter (HOSPITAL_COMMUNITY)
Admission: EM | Disposition: A | Discharge status: Hospice | Payer: Self-pay | Source: Home / Self Care | Attending: Internal Medicine

## 2015-05-19 HISTORY — PX: AMPUTATION: SHX166

## 2015-05-19 LAB — GLUCOSE, CAPILLARY
GLUCOSE-CAPILLARY: 146 mg/dL — AB (ref 65–99)
GLUCOSE-CAPILLARY: 152 mg/dL — AB (ref 65–99)
Glucose-Capillary: 140 mg/dL — ABNORMAL HIGH (ref 65–99)
Glucose-Capillary: 166 mg/dL — ABNORMAL HIGH (ref 65–99)

## 2015-05-19 LAB — CBC
HCT: 25.4 % — ABNORMAL LOW (ref 39.0–52.0)
Hemoglobin: 8.3 g/dL — ABNORMAL LOW (ref 13.0–17.0)
MCH: 27 pg (ref 26.0–34.0)
MCHC: 32.7 g/dL (ref 30.0–36.0)
MCV: 82.7 fL (ref 78.0–100.0)
PLATELETS: 269 10*3/uL (ref 150–400)
RBC: 3.07 MIL/uL — ABNORMAL LOW (ref 4.22–5.81)
RDW: 13 % (ref 11.5–15.5)
WBC: 9.9 10*3/uL (ref 4.0–10.5)

## 2015-05-19 LAB — BASIC METABOLIC PANEL
Anion gap: 8 (ref 5–15)
BUN: 42 mg/dL — AB (ref 6–20)
CALCIUM: 7.6 mg/dL — AB (ref 8.9–10.3)
CO2: 23 mmol/L (ref 22–32)
CREATININE: 2.42 mg/dL — AB (ref 0.61–1.24)
Chloride: 109 mmol/L (ref 101–111)
GFR calc Af Amer: 30 mL/min — ABNORMAL LOW (ref >60)
GFR, EST NON AFRICAN AMERICAN: 25 mL/min — AB (ref >60)
GLUCOSE: 169 mg/dL — AB (ref 65–99)
Potassium: 3.9 mmol/L (ref 3.5–5.1)
SODIUM: 140 mmol/L (ref 135–145)

## 2015-05-19 LAB — CULTURE, BLOOD (ROUTINE X 2)
CULTURE: NO GROWTH
Culture: NO GROWTH

## 2015-05-19 LAB — TYPE AND SCREEN
ABO/RH(D): B POS
Antibody Screen: NEGATIVE
UNIT DIVISION: 0

## 2015-05-19 SURGERY — AMPUTATION, FOOT, RAY
Anesthesia: General | Site: Foot | Laterality: Right

## 2015-05-19 MED ORDER — MIDAZOLAM HCL 5 MG/5ML IJ SOLN
INTRAMUSCULAR | Status: DC | PRN
  Administered 2015-05-19 (×2): 1 mg via INTRAVENOUS

## 2015-05-19 MED ORDER — PROPOFOL 10 MG/ML IV BOLUS
INTRAVENOUS | Status: AC
  Filled 2015-05-19: qty 20

## 2015-05-19 MED ORDER — LIDOCAINE HCL (CARDIAC) 20 MG/ML IV SOLN
INTRAVENOUS | Status: AC
  Filled 2015-05-19: qty 5

## 2015-05-19 MED ORDER — FENTANYL CITRATE (PF) 100 MCG/2ML IJ SOLN: 25.0000 ug | INTRAMUSCULAR | Status: DC | PRN

## 2015-05-19 MED ORDER — LIDOCAINE HCL (CARDIAC) 20 MG/ML IV SOLN
INTRAVENOUS | Status: DC | PRN
  Administered 2015-05-19: 50 mg via INTRAVENOUS

## 2015-05-19 MED ORDER — 0.9 % SODIUM CHLORIDE (POUR BTL) OPTIME
TOPICAL | Status: DC | PRN
  Administered 2015-05-19: 1000 mL

## 2015-05-19 MED ORDER — APIXABAN 2.5 MG PO TABS
2.5000 mg | ORAL_TABLET | Freq: Two times a day (BID) | ORAL | Status: DC
  Administered 2015-05-19 – 2015-05-24 (×11): 2.5 mg via ORAL
  Filled 2015-05-19 (×12): qty 1

## 2015-05-19 MED ORDER — PROPOFOL 10 MG/ML IV BOLUS
INTRAVENOUS | Status: DC | PRN
  Administered 2015-05-19: 100 mg via INTRAVENOUS

## 2015-05-19 MED ORDER — SODIUM CHLORIDE 0.9 % IV SOLN
INTRAVENOUS | Status: DC | PRN
  Administered 2015-05-19: 10:00:00 via INTRAVENOUS

## 2015-05-19 MED ORDER — FENTANYL CITRATE (PF) 100 MCG/2ML IJ SOLN
INTRAMUSCULAR | Status: AC
  Filled 2015-05-19: qty 2

## 2015-05-19 MED ORDER — FENTANYL CITRATE (PF) 100 MCG/2ML IJ SOLN
INTRAMUSCULAR | Status: DC | PRN
  Administered 2015-05-19 (×2): 50 ug via INTRAVENOUS

## 2015-05-19 MED ORDER — ONDANSETRON HCL 4 MG/2ML IJ SOLN: 4.0000 mg | Freq: Once | INTRAMUSCULAR | Status: DC | PRN

## 2015-05-19 MED ORDER — ONDANSETRON HCL 4 MG/2ML IJ SOLN
INTRAMUSCULAR | Status: AC
  Filled 2015-05-19: qty 2

## 2015-05-19 MED ORDER — MIDAZOLAM HCL 2 MG/2ML IJ SOLN
INTRAMUSCULAR | Status: AC
  Filled 2015-05-19: qty 2

## 2015-05-19 MED ORDER — ONDANSETRON HCL 4 MG/2ML IJ SOLN
INTRAMUSCULAR | Status: DC | PRN
  Administered 2015-05-19: 4 mg via INTRAVENOUS

## 2015-05-19 MED ORDER — FUROSEMIDE 10 MG/ML IJ SOLN
100.0000 mg | Freq: Three times a day (TID) | INTRAVENOUS | Status: DC
  Administered 2015-05-19 – 2015-05-20 (×2): 100 mg via INTRAVENOUS
  Filled 2015-05-19 (×3): qty 10

## 2015-05-19 MED ORDER — SODIUM CHLORIDE 0.9 % IV SOLN: INTRAVENOUS | Status: DC

## 2015-05-19 MED ORDER — ROPIVACAINE HCL 5 MG/ML IJ SOLN
INTRAMUSCULAR | Status: AC
  Filled 2015-05-19: qty 30

## 2015-05-19 SURGICAL SUPPLY — 37 items
BAG SPEC THK2 15X12 ZIP CLS (MISCELLANEOUS) ×1
BAG ZIPLOCK 12X15 (MISCELLANEOUS) ×3 IMPLANT
BANDAGE ELASTIC 4 VELCRO ST LF (GAUZE/BANDAGES/DRESSINGS) ×1 IMPLANT
BANDAGE ELASTIC 6 VELCRO ST LF (GAUZE/BANDAGES/DRESSINGS) ×1 IMPLANT
BANDAGE ESMARK 6X9 LF (GAUZE/BANDAGES/DRESSINGS) ×1 IMPLANT
BLADE SAW SGTL 81X20 HD (BLADE) ×2 IMPLANT
BNDG CMPR 9X6 STRL LF SNTH (GAUZE/BANDAGES/DRESSINGS) ×1
BNDG COHESIVE 4X5 TAN STRL (GAUZE/BANDAGES/DRESSINGS) ×3 IMPLANT
BNDG ESMARK 6X9 LF (GAUZE/BANDAGES/DRESSINGS) ×3
BNDG GAUZE ELAST 4 BULKY (GAUZE/BANDAGES/DRESSINGS) ×3 IMPLANT
CUFF TOURN SGL QUICK 34 (TOURNIQUET CUFF)
CUFF TRNQT CYL 34X4X40X1 (TOURNIQUET CUFF) ×1 IMPLANT
DRAPE U-SHAPE 47X51 STRL (DRAPES) ×3 IMPLANT
DURAPREP 26ML APPLICATOR (WOUND CARE) ×3 IMPLANT
ELECT REM PT RETURN 9FT ADLT (ELECTROSURGICAL) ×3
ELECTRODE REM PT RTRN 9FT ADLT (ELECTROSURGICAL) ×1 IMPLANT
GAUZE SPONGE 4X4 12PLY STRL (GAUZE/BANDAGES/DRESSINGS) ×3 IMPLANT
GAUZE XEROFORM 5X9 LF (GAUZE/BANDAGES/DRESSINGS) ×3 IMPLANT
GLOVE BIOGEL PI IND STRL 8 (GLOVE) ×1 IMPLANT
GLOVE BIOGEL PI INDICATOR 8 (GLOVE) ×2
GLOVE ECLIPSE 8.0 STRL XLNG CF (GLOVE) ×3 IMPLANT
GLOVE ORTHO TXT STRL SZ7.5 (GLOVE) ×3 IMPLANT
GOWN STRL REUS W/TWL XL LVL3 (GOWN DISPOSABLE) ×3 IMPLANT
KIT BASIN OR (CUSTOM PROCEDURE TRAY) ×3 IMPLANT
NS IRRIG 1000ML POUR BTL (IV SOLUTION) ×3 IMPLANT
PACK ORTHO EXTREMITY (CUSTOM PROCEDURE TRAY) ×2 IMPLANT
PACK TOTAL JOINT (CUSTOM PROCEDURE TRAY) ×1 IMPLANT
PAD ABD 8X10 STRL (GAUZE/BANDAGES/DRESSINGS) ×2 IMPLANT
PAD CAST 4YDX4 CTTN HI CHSV (CAST SUPPLIES) ×1 IMPLANT
PADDING CAST COTTON 4X4 STRL (CAST SUPPLIES) ×3
POSITIONER SURGICAL ARM (MISCELLANEOUS) ×5 IMPLANT
SPONGE LAP 18X18 X RAY DECT (DISPOSABLE) ×4 IMPLANT
STAPLER VISISTAT 35W (STAPLE) ×1 IMPLANT
STOCKINETTE 8 INCH (MISCELLANEOUS) ×3 IMPLANT
SUT ETHILON 2 0 PSLX (SUTURE) ×6 IMPLANT
TOWEL OR 17X26 10 PK STRL BLUE (TOWEL DISPOSABLE) ×5 IMPLANT
WATER STERILE IRR 1500ML POUR (IV SOLUTION) ×1 IMPLANT

## 2015-05-19 NOTE — Anesthesia Postprocedure Evaluation (Signed)
Anesthesia Post Note  Patient: Justin Hansenonald Miu Sr.  Procedure(s) Performed: Procedure(s) (LRB): 3rd-5th RAY AMPUTATION RIGHT FOOT (Right)  Patient location during evaluation: PACU Anesthesia Type: General Level of consciousness: awake and alert Pain management: pain level controlled Vital Signs Assessment: post-procedure vital signs reviewed and stable Respiratory status: spontaneous breathing, nonlabored ventilation, respiratory function stable and patient connected to nasal cannula oxygen Cardiovascular status: blood pressure returned to baseline and stable Postop Assessment: no signs of nausea or vomiting Anesthetic complications: no    Last Vitals:  Filed Vitals:   05/19/15 1445 05/19/15 2053  BP: 157/67 145/58  Pulse: 67 64  Temp: 37.3 C 36.9 C  Resp: 18 19    Last Pain:  Filed Vitals:   05/19/15 2054  PainSc: 1                  Bryndon Cumbie JENNETTE

## 2015-05-19 NOTE — Transfer of Care (Signed)
Immediate Anesthesia Transfer of Care Note  Patient: Justin Hansenonald Bierlein Sr.  Procedure(s) Performed: Procedure(s): 3rd-5th RAY AMPUTATION RIGHT FOOT (Right)  Patient Location: PACU  Anesthesia Type:General  Level of Consciousness: awake, alert  and oriented  Airway & Oxygen Therapy: Patient Spontanous Breathing and Patient connected to face mask oxygen  Post-op Assessment: Report given to RN and Post -op Vital signs reviewed and stable  Post vital signs: Reviewed and stable  Last Vitals:  Filed Vitals:   05/18/15 2318 05/19/15 0639  BP: 174/63 150/62  Pulse:  65  Temp: 37.2 C 37.3 C  Resp: 18 19    Complications: No apparent anesthesia complications

## 2015-05-19 NOTE — Op Note (Signed)
NAMMitzi Hansen:  [name], Justin             ACCOUNT NO.:  [id number]  MEDICAL RECORD NO.:  [id number]  LOCATION:  [facility]                         [facility]  PHYSICIAN:  [name], Justin RamseyDATE OF BIRTH:  06-30-1945  DATE OF PROCEDURE:  05/19/2015 DATE OF DISCHARGE:                              OPERATIVE REPORT   PREOPERATIVE DIAGNOSIS:  Peripheral vascular disease with right foot third through fifth necrotic toes and chronic wound.  POSTOPERATIVE DIAGNOSIS:  Peripheral vascular disease with right foot third through fifth necrotic toes and chronic wound.  PROCEDURE:  Right foot third through fifth ray amputations/resections.  SURGEON:  [name], M.D.  ANESTHESIA:  General.  BLOOD LOSS:  Minimal.  COMPLICATIONS:  None.  INDICATIONS:  [name] is a 69 year old bed-bound individual who has a history of multiple medical problems including peripheral vascular disease.  He has had a previous left below-knee amputation and I have seen him for sometime.  He presented to the emergency room this week for other medical issues, but was noted to have third, fourth, and fifth toes that were dusky and dark with the wound on the lateral border of his plantar and lateral border of his right foot from where his leg chronically externally rotates.  At this point, we recommended third through fifth ray resection with the possibility of transmetatarsal amputation and the family knows we may be heading in that direction, but they want us to try to be as minimal as possible.  The risks and benefits of surgery have been explained to him and his wife in detail and they do wish us to proceed with surgery.  DESCRIPTION OF PROCEDURE:  After informed consent was obtained, appropriate right foot was marked.  He was brought to the operating room, placed supine on the operating table.  General anesthesia was then obtained.  His right foot was prepped and draped with DuraPrep  and sterile drapes.  Time-out was called, he was identified as correct patient, correct right foot.  I then used Esmarch as a local tourniquet around the ankle and then made a wide incision with a #10 blade from the third toe to the fifth toe.  Once I removed the toes and passed these off, I backed up the soft tissue over each metatarsal and used oscillating saw to resect each third, fourth, and fifth metatarsal near the base.  I then removed some more necrotic tissue easily with a #10 blade and then irrigated the wound with normal saline solution.  I then reapproximated the skin with interrupted 2-0 nylon suture.  We did let down the Esmarch before closing and there was no significant bleeding at all.  We then placed Xeroform well-padded sterile dressing including pillow around his ankle that he came down to the OR with to keep pressure off the side of his foot.  He was awakened, extubated, and taken to recovery room in stable condition.  All final counts were correct.  There were no complications noted.     Justin Pandahristopher Y. Magnus [name], M.D.     CYB/MEDQ  D:  05/19/2015  T:  05/19/2015  Job:  [id number]

## 2015-05-19 NOTE — Brief Op Note (Signed)
05/14/2015 - 05/19/2015  11:28 AM  PATIENT:  Justin Hansenonald Mcwatters Sr.  69 y.o. male  PRE-OPERATIVE DIAGNOSIS:  RIGHT FOOT OSTEOMYELITIS, Peripheral Vascular disease, chronic lateral wound  POST-OPERATIVE DIAGNOSIS:  RIGHT FOOT OSTEOMYELITIS; same  PROCEDURE:  Procedure(s): 3rd-5th RAY AMPUTATION RIGHT FOOT (Right)  SURGEON:  Surgeon(s) and Role:    * Kathryne Hitchhristopher Y Alera Quevedo, MD - Primary  ANESTHESIA:   general  EBL:    minimal  COUNTS:  YES  TOURNIQUET:   Total Tourniquet Time Documented: area (Right) - 14 minutes Total: area (Right) - 14 minutes   DICTATION: .Other Dictation: Dictation Number [id number]  PLAN OF CARE: Admit to inpatient   PATIENT DISPOSITION:  PACU - hemodynamically stable.   Delay start of Pharmacological VTE agent (>24hrs) due to surgical blood loss or risk of bleeding: no

## 2015-05-19 NOTE — Progress Notes (Signed)
Rash noted to upper left arm after returning from sx.   This Clinical research associatewriter also d/c tx orders for toes that were amputated in sx.  MD aware.

## 2015-05-19 NOTE — Progress Notes (Signed)
[facility] PROGRESS NOTE  [name] Sr. ZOX:[id number] DOB: Feb 10, 1946 DOA: 05/14/2015 PCP: [name], MD  HPI/Brief narrative 69 year old male with past medical history of hypertension, CKD stage 4 (baseline Cr 2.4), left BKA, history of pulmonary embolism (on anticoagulation with apixaban) diabetes mellitus on insulin, recently hospitalized for acute encephalopathy thought to be due to acute infection from presumed UTI as well as possible R foot cellulitis. The patient was admitted for further work up.  Assessment/Plan:  Cellulitis of right foot, 4th toe / Leukocytosis - R 4th and 5th digits with necrosis and concerns for underlying infection. Cellulitis order set placed. Choice of abx clindamycin for now (did not place vanco because of renal failure and secondary to order set classification) - WOC consulted.  - R sided abi performed on 12/7 and demonstrated severe loss of flow with abnormal waveforms and flat great toe - Had consulted Dr. Magnus Ivan. Patient has since undergone surgery on 12/11   Acute encephalopathy - Likely from acute infection, acute on chronic renal failure - Monitor mental status - Seems stable thus far   S/P BKA (below knee amputation) unilateral (HCC) - Stable    History of pulmonary embolism - Continued apixaban, but have transitioned to heparin gtt in anticipation of upcoming surgery   Acute renal failure superimposed on stage 4 chronic kidney disease (HCC) / Metabolic acidosis - Baseline Cr 2.4 - Cr today 2.42 - Lasix currently on hold - Nephrology was consulted and is following. Appreciate input   Hyperkalemia - Due to acute on chronic renal failure as well as potassium supplementation  - Given insulin, calcium gluconate and albuterol in ED - Potassium level currently normal   Uncontrolled diabetes mellitus with circulatory complication, with long-term current use of insulin (HCC) / Diabetic peripheral neuropathy (HCC) - A1c in  2015 was 8 - Check A1c on this admission - On SSI at home. Order placed for SSI.    Anemia of chronic renal failure, stage 4 (severe) (HCC) - Hemoglobin overall stable at 8.3 after one unit PRBC's   Benign essential HTN - Continue Norvasc, carvedilol and clonidine   Chronic combined systolic and diastolic CHF (congestive heart failure) (HCC)  - Remains stable, compensated - Last 2 D ECHO in 03/2013 with EF 50% grade 1 diastolic dysfunction - Lasix on hold due to worsening renal function  DVT prophylaxis:  - on therapeutic anticoagulation  Code Status: DNR Family Communication: Pt in room Disposition Plan: transfer to floor  Consultants:  Nephrology - Dr. Hyman Hopes  Vascular Surgery - Dr. Hart Rochester  Orthopedic Surgery - Dr. Magnus Ivan  Procedures:    Antibiotics: Anti-infectives    Start     Dose/Rate Route Frequency Ordered Stop   05/15/15 0200  clindamycin (CLEOCIN) IVPB 600 mg     600 mg 100 mL/hr over 30 Minutes Intravenous Every 8 hours 05/14/15 1826     05/14/15 1445  clindamycin (CLEOCIN) IVPB 600 mg     600 mg 100 mL/hr over 30 Minutes Intravenous  Once 05/14/15 1435 05/14/15 1847      HPI/Subjective: Seen this AM and pt was eager to have surgery  Objective: Filed Vitals:   05/19/15 1150 05/19/15 1152 05/19/15 1200 05/19/15 1215  BP: 151/74 149/70 145/66 153/61  Pulse: 70 69 74 64  Temp:   98.4 F (36.9 C) 98.4 F (36.9 C)  TempSrc:      Resp: Height:      Weight:  SpO2: 100% 100% 99% 97%    Intake/Output Summary (Last 24 hours) at 05/19/15 1327 Last data filed at 05/19/15 1200  Gross per 24 hour  Intake    666 ml  Output   1800 ml  Net  -1134 ml   Filed Weights   05/16/15 0500 05/18/15 0500 05/19/15 0500  Weight: 106.6 kg (235 lb 0.2 oz) 132.133 kg (291 lb 4.8 oz) 134 kg (295 lb 6.7 oz)    Exam:   General:  Awake, laying in bed, in nad  Cardiovascular: regular, s1, s2  Respiratory: normal resp effort, no  wheezing  Abdomen: soft, nondistended, pos BS  Musculoskeletal: seen this AM, s/p LLE amputation, RLE remained with AFO in place   Data Reviewed: Basic Metabolic Panel:  Recent Labs Lab 05/14/15 1645 05/15/15 0350 05/16/15 0330 05/17/15 0340 05/18/15 0306 05/19/15 0500  NA 144  144 144 142 140 141 140  K 6.5*  6.5* 5.6* 4.7 5.3* 4.6 3.9  CL 120*  120* 115* 109 108 111 109  CO2 19*  18* 21* 27 25 23 23   GLUCOSE 77  78 117* 151* 181* 182* 169*  BUN 50*  51* 47* 54* 54* 49* 42*  CREATININE 2.64*  2.56* 2.70* 2.79* 2.67* 2.65* 2.42*  CALCIUM 8.8*  8.8* 8.4* 7.5* 7.6* 7.4* 7.6*  MG 2.3  --   --   --   --   --   PHOS 5.1*  5.0*  --   --   --   --   --    Liver Function Tests:  Recent Labs Lab 05/14/15 1645  ALBUMIN 2.5*   No results for input(s): LIPASE, AMYLASE in the last 168 hours. No results for input(s): AMMONIA in the last 168 hours. CBC:  Recent Labs Lab 05/14/15 1155 05/14/15 1645 05/15/15 0511 05/16/15 0330 05/17/15 0340 05/18/15 0306 05/19/15 0500  WBC 10.8* 12.3* 9.7 10.4 11.1* 11.4* 9.9  NEUTROABS 8.3* 9.4*  --   --   --   --   --   HGB 9.9* 9.0* 8.4* 7.3* 7.6* 7.4* 8.3*  HCT 30.2* 28.3* 25.5* 23.0* 24.0* 22.9* 25.4*  MCV 83.0 84.2 81.5 83.6 83.9 85.1 82.7  PLT 338 309 272 259 266 267 269   Cardiac Enzymes: No results for input(s): CKTOTAL, CKMB, CKMBINDEX, TROPONINI in the last 168 hours. BNP (last 3 results)  Recent Labs  04/05/15 1439  BNP 175.9*    ProBNP (last 3 results) No results for input(s): PROBNP in the last 8760 hours.  CBG:  Recent Labs Lab 05/18/15 1147 05/18/15 1648 05/18/15 2211 05/19/15 0728 05/19/15 1201  GLUCAP 177* 149* 170* 140* 146*    Recent Results (from the past 240 hour(s))  Blood culture (routine x 2)     Status: None   Collection Time: 05/14/15 11:51 AM  Result Value Ref Range Status   Specimen Description BLOOD RIGHT ANTECUBITAL  Final   Special Requests BOTTLES DRAWN AEROBIC AND ANAEROBIC  5ML  Final   Culture   Final    NO GROWTH 5 DAYS [facility]    Report Status 05/19/2015 FINAL  Final  Blood culture (routine x 2)     Status: None   Collection Time: 05/14/15  1:23 PM  Result Value Ref Range Status   Specimen Description BLOOD LEFT HAND  Final   Special Requests BOTTLES DRAWN AEROBIC AND ANAEROBIC 5CC  Final   Culture   Final    NO GROWTH 5 DAYS Performed  at [facility]    Report Status 05/19/2015 FINAL  Final  MRSA PCR Screening     Status: None   Collection Time: 05/14/15  4:26 PM  Result Value Ref Range Status   MRSA by PCR NEGATIVE NEGATIVE Final    Comment:        The GeneXpert MRSA Assay (FDA approved for NASAL specimens only), is one component of a comprehensive MRSA colonization surveillance program. It is not intended to diagnose MRSA infection nor to guide or monitor treatment for MRSA infections.      Studies: No results found.  Scheduled Meds: . amLODipine  5 mg Oral Daily  . apixaban  2.5 mg Oral BID WC  . carvedilol  12.5 mg Oral BID WC  . clindamycin (CLEOCIN) IV  600 mg Intravenous Q8H  . cloNIDine  0.1 mg Oral TID  . feeding supplement (PRO-STAT SUGAR FREE 64)  30 mL Oral Daily  . furosemide  80 mg Oral BID  . insulin aspart  0-15 Units Subcutaneous TID WC  . insulin aspart  0-5 Units Subcutaneous QHS  . nutrition supplement (JUVEN)  1 packet Oral BID BM  . sodium chloride  3 mL Intravenous Q12H  . tamsulosin  0.4 mg Oral Daily   Continuous Infusions:    Principal Problem:   Cellulitis of fourth toe of right foot Active Problems:   Diabetic peripheral neuropathy (HCC)   Acute encephalopathy   S/P BKA (below knee amputation) unilateral (HCC)   History of pulmonary embolism   Acute renal failure superimposed on stage 4 chronic kidney disease (HCC)   Hyperkalemia   Metabolic acidosis   Uncontrolled diabetes mellitus with circulatory complication, with long-term current use of insulin (HCC)    Anemia of chronic renal failure, stage 4 (severe) (HCC)   Benign essential HTN   Chronic combined systolic and diastolic CHF (congestive heart failure) (HCC)   Leukocytosis   CHIU, STEPHEN K  [facility] Pager [phone]. If 7PM-7AM, please contact night-coverage at [email or web address], password [facility] 05/19/2015, 1:27 PM  LOS: 5 days

## 2015-05-19 NOTE — Anesthesia Procedure Notes (Signed)
Procedure Name: LMA Insertion Date/Time: 05/19/2015 10:45 AM Performed by: Enriqueta ShutterWILLIFORD, Deajah Erkkila D Pre-anesthesia Checklist: Patient identified, Emergency Drugs available, Suction available and Patient being monitored Patient Re-evaluated:Patient Re-evaluated prior to inductionOxygen Delivery Method: Circle System Utilized Preoxygenation: Pre-oxygenation with 100% oxygen Intubation Type: IV induction Ventilation: Mask ventilation without difficulty LMA: LMA inserted LMA Size: 4.0 Tube type: Oral Number of attempts: 1 Placement Confirmation: positive ETCO2 and breath sounds checked- equal and bilateral Tube secured with: Tape Dental Injury: Teeth and Oropharynx as per pre-operative assessment

## 2015-05-19 NOTE — Progress Notes (Signed)
Patient ID: Justin HansenDonald Romero Sr., male   DOB: 11/26/1945, 69 y.o.   MRN: [id number] We are proceeding to the OR today as planned for a right foot transmetatarsal amputation vs 3rd - 5th toe/ray amputations.

## 2015-05-19 NOTE — Progress Notes (Signed)
ANTICOAGULATION CONSULT NOTE - Follow Up Consult  Pharmacy Consult for Heparin Indication: chronic Apixaban for hx of PE, IV Heparin for surgery  Allergies  Allergen Reactions  . Ancef [Cefazolin] Itching and Rash  . Sulfa Antibiotics Itching and Rash    Patient Measurements: Height: 5\' 8"  (172.7 cm) Weight: 291 lb 4.8 oz (132.133 kg) IBW/kg (Calculated) : 68.4 Heparin Dosing Weight:   Vital Signs: Temp: 99 F (37.2 C) (12/10 2318) Temp Source: Axillary (12/10 2318) BP: 174/63 mmHg (12/10 2318) Pulse Rate: 66 (12/10 1500)  Labs:  Recent Labs  05/16/15 0330 05/17/15 0340  05/17/15 2023 05/18/15 0306 05/18/15 1220 05/18/15 2000  HGB 7.3* 7.6*  --   --  7.4*  --   --   HCT 23.0* 24.0*  --   --  22.9*  --   --   PLT 259 266  --   --  267  --   --   APTT  --   --   < > 40* 75* 86* 80*  HEPARINUNFRC  --   --   --  1.88* 1.50* 1.10*  --   CREATININE 2.79* 2.67*  --   --  2.65*  --   --   < > = values in this interval not displayed.  Estimated Creatinine Clearance: 34.4 mL/min (by C-G formula based on Cr of 2.65).   Medications:  Infusions:  . heparin 1,400 Units/hr (05/18/15 1323)    Assessment: Patient with PTT at goal again.  PTT ordered with Heparin level until both correlate due to possible drug-lab interaction between rivaroxaban and anti-Xa level (aka heparin level)  No heparin issues noted.  Drip to be turned off at 0600 per prior orders.    Goal of Therapy:  Heparin level 0.3-0.7 units/ml aPTT 66-102 seconds Monitor platelets by anticoagulation protocol: Yes   Plan:  Continue heparin drip at current rate Recheck level after drip restart.  Darlina GuysGrimsley Jr, Jacquenette ShoneJulian Crowford 05/19/2015,2:53 AM

## 2015-05-19 NOTE — Anesthesia Preprocedure Evaluation (Addendum)
Anesthesia Evaluation  Patient identified by MRN, date of birth, ID band Patient awake    Reviewed: Allergy & Precautions, NPO status , Patient's Chart, lab work & pertinent test results  History of Anesthesia Complications Negative for: history of anesthetic complications  Airway Mallampati: III  TM Distance: >3 FB Neck ROM: Full    Dental no notable dental hx. (+) Dental Advisory Given, Missing, Loose, Poor Dentition   Pulmonary former smoker, PE   Pulmonary exam normal breath sounds clear to auscultation       Cardiovascular hypertension, Pt. on medications and Pt. on home beta blockers +CHF  Normal cardiovascular exam Rhythm:Regular Rate:Normal     Neuro/Psych negative neurological ROS  negative psych ROS   GI/Hepatic negative GI ROS, Neg liver ROS,   Endo/Other  diabetesMorbid obesity  Renal/GU CRFRenal diseaseCKD stage 4  negative genitourinary   Musculoskeletal negative musculoskeletal ROS (+)   Abdominal   Peds negative pediatric ROS (+)  Hematology negative hematology ROS (+)   Anesthesia Other Findings   Reproductive/Obstetrics negative OB ROS                         Anesthesia Physical Anesthesia Plan  ASA: IV and emergent  Anesthesia Plan: General   Post-op Pain Management:    Induction: Intravenous  Airway Management Planned: LMA  Additional Equipment:   Intra-op Plan:   Post-operative Plan: Extubation in OR  Informed Consent: I have reviewed the patients History and Physical, chart, labs and discussed the procedure including the risks, benefits and alternatives for the proposed anesthesia with the patient or authorized representative who has indicated his/her understanding and acceptance.   Dental advisory given  Plan Discussed with: CRNA  Anesthesia Plan Comments: (Thought about doing a peripheral nerve block for this procedure however given his extremely  tight peripheral edema and cellulitis, did not think that was the best course of action. Will proceed with a general anesthetic. )       Anesthesia Quick Evaluation

## 2015-05-19 NOTE — Progress Notes (Signed)
[facility] Progress Note   Subjective: no compliants, UOP better on lasix IV , 1800 cc yest  Filed Vitals:   05/19/15 1152 05/19/15 1200 05/19/15 1215 05/19/15 1445  BP: 149/70 145/66 153/61 157/67  Pulse: 69 74 64 67  Temp:  98.4 F (36.9 C) 98.4 F (36.9 C) 99.1 F (37.3 C)  TempSrc:    Oral  Resp: $Remo'14 17  18  'UfWug$ Height:      Weight:      SpO2: 100% 99% 97% 100%    Inpatient medications: . amLODipine  5 mg Oral Daily  . apixaban  2.5 mg Oral BID WC  . carvedilol  12.5 mg Oral BID WC  . clindamycin (CLEOCIN) IV  600 mg Intravenous Q8H  . cloNIDine  0.1 mg Oral TID  . feeding supplement (PRO-STAT SUGAR FREE 64)  30 mL Oral Daily  . furosemide  80 mg Oral BID  . insulin aspart  0-15 Units Subcutaneous TID WC  . insulin aspart  0-5 Units Subcutaneous QHS  . nutrition supplement (JUVEN)  1 packet Oral BID BM  . sodium chloride  3 mL Intravenous Q12H  . tamsulosin  0.4 mg Oral Daily     acetaminophen **OR** acetaminophen, bisacodyl, HYDROcodone-acetaminophen, hydrOXYzine, LORazepam, ondansetron **OR** ondansetron (ZOFRAN) IV, sodium chloride, traMADol  Exam: Elderly AAM no distress, gen weakness No jvd Chest dec'd bilat bases RRR no mrg Abd obese soft ntnd +bs 2-3+ edema bilat hips/ LE's  R leg in brace, R foot wrapped L BKA Neuro bed bound, gen weakness, nonfocal  CXR 10/28 no edema I/O 6L in and 3L out since admitted Last ECHO 2014  LVEF 50-55%     Assessment: 1 CKD stage 3/4 baseline creat 1.5 - 2.8 (quite variable over last 3 yrs) 2 Vol excess/ anasarca LE's 3 PVD for R BKA tomorrow 4 Debil lives in SNF / hx L BKA 5 Met acidosis better 6 DNR 7 S/P R foot toe amps 12/10  Plan - change to IV lasix 100 tid   Kelly Splinter MD [facility] pager [phone]    cell [phone] 05/19/2015, 6:18 PM    Recent Labs Lab 05/14/15 1645  05/17/15 0340 05/18/15 0306 05/19/15 0500  NA 144  144  < > 140 141 140  K 6.5*  6.5*  < >  5.3* 4.6 3.9  CL 120*  120*  < > 108 111 109  CO2 19*  18*  < > $R'25 23 23  'xr$ GLUCOSE 77  78  < > 181* 182* 169*  BUN 50*  51*  < > 54* 49* 42*  CREATININE 2.64*  2.56*  < > 2.67* 2.65* 2.42*  CALCIUM 8.8*  8.8*  < > 7.6* 7.4* 7.6*  PHOS 5.1*  5.0*  --   --   --   --   < > = values in this interval not displayed.  Recent Labs Lab 05/14/15 1645  ALBUMIN 2.5*    Recent Labs Lab 05/14/15 1155 05/14/15 1645  05/17/15 0340 05/18/15 0306 05/19/15 0500  WBC 10.8* 12.3*  < > 11.1* 11.4* 9.9  NEUTROABS 8.3* 9.4*  --   --   --   --   HGB 9.9* 9.0*  < > 7.6* 7.4* 8.3*  HCT 30.2* 28.3*  < > 24.0* 22.9* 25.4*  MCV 83.0 84.2  < > 83.9 85.1 82.7  PLT 338 309  < > 266 267 269  < > = values in this interval not displayed.

## 2015-05-20 ENCOUNTER — Encounter (HOSPITAL_COMMUNITY): Payer: Self-pay | Admitting: Orthopaedic Surgery

## 2015-05-20 LAB — BASIC METABOLIC PANEL
ANION GAP: 7 (ref 5–15)
BUN: 49 mg/dL — ABNORMAL HIGH (ref 6–20)
CO2: 24 mmol/L (ref 22–32)
Calcium: 7.5 mg/dL — ABNORMAL LOW (ref 8.9–10.3)
Chloride: 105 mmol/L (ref 101–111)
Creatinine, Ser: 2.81 mg/dL — ABNORMAL HIGH (ref 0.61–1.24)
GFR, EST AFRICAN AMERICAN: 25 mL/min — AB (ref >60)
GFR, EST NON AFRICAN AMERICAN: 21 mL/min — AB (ref >60)
GLUCOSE: 193 mg/dL — AB (ref 65–99)
POTASSIUM: 3.9 mmol/L (ref 3.5–5.1)
Sodium: 136 mmol/L (ref 135–145)

## 2015-05-20 LAB — CBC
HEMATOCRIT: 26.4 % — AB (ref 39.0–52.0)
HEMOGLOBIN: 8.7 g/dL — AB (ref 13.0–17.0)
MCH: 27.4 pg (ref 26.0–34.0)
MCHC: 33 g/dL (ref 30.0–36.0)
MCV: 83 fL (ref 78.0–100.0)
Platelets: 276 10*3/uL (ref 150–400)
RBC: 3.18 MIL/uL — AB (ref 4.22–5.81)
RDW: 13.2 % (ref 11.5–15.5)
WBC: 10.2 10*3/uL (ref 4.0–10.5)

## 2015-05-20 LAB — GLUCOSE, CAPILLARY
GLUCOSE-CAPILLARY: 171 mg/dL — AB (ref 65–99)
GLUCOSE-CAPILLARY: 164 mg/dL — AB (ref 65–99)
Glucose-Capillary: 149 mg/dL — ABNORMAL HIGH (ref 65–99)
Glucose-Capillary: 161 mg/dL — ABNORMAL HIGH (ref 65–99)

## 2015-05-20 MED ORDER — GLUCERNA SHAKE PO LIQD
237.0000 mL | Freq: Every morning | ORAL | Status: DC
  Administered 2015-05-20 – 2015-05-24 (×3): 237 mL via ORAL
  Filled 2015-05-20 (×5): qty 237

## 2015-05-20 MED ORDER — DEXTROSE 5 % IV SOLN
100.0000 mg | Freq: Two times a day (BID) | INTRAVENOUS | Status: DC
  Administered 2015-05-20 – 2015-05-22 (×4): 100 mg via INTRAVENOUS
  Filled 2015-05-20 (×6): qty 10

## 2015-05-20 NOTE — Progress Notes (Signed)
[facility] PROGRESS NOTE  Justin Dunlap Sr. ZOX:[id number] DOB: 05-27-1946 DOA: 05/14/2015 PCP: [name], MD  HPI/Brief narrative 69 year old male with past medical history of hypertension, CKD stage 4 (baseline Cr 2.4), left BKA, history of pulmonary embolism (on anticoagulation with apixaban) diabetes mellitus on insulin, recently hospitalized for acute encephalopathy thought to be due to acute infection from presumed UTI as well as possible R foot cellulitis. The patient was admitted for further work up.  Assessment/Plan:  Cellulitis of right foot, 4th toe / Leukocytosis - R 4th and 5th digits with necrosis and concerns for underlying infection.  - Patient is continued on clindamycin for now - WOC was consulted.  - R sided abi performed on 12/7 and demonstrated severe loss of flow with abnormal waveforms and flat great toe - Dr. Magnus Ivan was consulted and patient has since undergone R third through fifth ray amputation/resection on 12/11   Acute encephalopathy - Likely from acute infection, acute on chronic renal failure - Monitor mental status - Seems stable thus far   S/P BKA (below knee amputation) unilateral (HCC) - Stable    History of pulmonary embolism - Resumed apixaban post-op - Patient was on heparin gtt prior to surgery   Acute renal failure superimposed on stage 4 chronic kidney disease (HCC) / Metabolic acidosis - Baseline Cr 2.4 - Cr today 2.81 - Nephrology was consulted and is following. Appreciate input - Now on lasix gtt - Patient is not candidate for dialysis   Hyperkalemia - Due to acute on chronic renal failure as well as potassium supplementation  - Given insulin, calcium gluconate and albuterol in ED - Potassium level currently remains normal   Uncontrolled diabetes mellitus with circulatory complication, with long-term current use of insulin (HCC) / Diabetic peripheral neuropathy (HCC) - A1c in 2015 was 8 - A1c on this admission  7.2 - Continue SSI coverage.    Anemia of chronic renal failure, stage 4 (severe) (HCC) - Hemoglobin overall stable at 8.3 after one unit PRBC's   Benign essential HTN - Continue Norvasc, carvedilol and clonidine   Acute on Chronic combined systolic and diastolic CHF (congestive heart failure) (HCC)  - Clinically mildly volume overloaded - Patient on lasix gtt per Nephrology - Last 2 D ECHO in 03/2013 with EF 50% grade 1 diastolic dysfunction  DVT prophylaxis:  - on therapeutic anticoagulation  Code Status: DNR Family Communication: Pt in room Disposition Plan: Pending PT/OT eval  Consultants:  Nephrology - Dr. Hyman Hopes  Vascular Surgery - Dr. Hart Rochester  Orthopedic Surgery - Dr. Magnus Ivan  Procedures:    Antibiotics: Anti-infectives    Start     Dose/Rate Route Frequency Ordered Stop   05/15/15 0200  clindamycin (CLEOCIN) IVPB 600 mg     600 mg 100 mL/hr over 30 Minutes Intravenous Every 8 hours 05/14/15 1826     05/14/15 1445  clindamycin (CLEOCIN) IVPB 600 mg     600 mg 100 mL/hr over 30 Minutes Intravenous  Once 05/14/15 1435 05/14/15 1847      HPI/Subjective: Patient is without complaints this AM. Denies sob  Objective: Filed Vitals:   05/20/15 0500 05/20/15 0631 05/20/15 1053 05/20/15 1404  BP:  152/75 140/52 131/53  Pulse:  62 63 69  Temp:  98.3 F (36.8 C) 97.7 F (36.5 C) 99.8 F (37.7 C)  TempSrc:  Oral Oral Oral  Resp:  Height:  (1.778 m)     Weight: 133.1 kg (293 lb 6.9 oz)  134.4 kg (296 lb 4.8 oz)    SpO2:  98% 97% 98%    Intake/Output Summary (Last 24 hours) at 05/20/15 1553 Last data filed at 05/20/15 [id number]  Gross per 24 hour  Intake    580 ml  Output   2950 ml  Net  -2370 ml   Filed Weights   05/19/15 0500 05/20/15 0500 05/20/15 0631  Weight: 134 kg (295 lb 6.7 oz) 133.1 kg (293 lb 6.9 oz) 134.4 kg (296 lb 4.8 oz)    Exam:   General:  Asleep, easily arousable, in nad  Cardiovascular: regular, s1,  s2  Respiratory: normal resp effort, no wheezing  Abdomen: soft, nondistended, pos BS  Musculoskeletal: s/p prior LLE amputation, RLE with post-op dressings inplace   Data Reviewed: Basic Metabolic Panel:  Recent Labs Lab 05/14/15 1645  05/16/15 0330 05/17/15 0340 05/18/15 0306 05/19/15 0500 05/20/15 0420  NA 144  144  < > 142 140 141 140 136  K 6.5*  6.5*  < > 4.7 5.3* 4.6 3.9 3.9  CL 120*  120*  < > 109 108 111 109 105  CO2 19*  18*  < > 27 25 23 23 24   GLUCOSE 77  78  < > 151* 181* 182* 169* 193*  BUN 50*  51*  < > 54* 54* 49* 42* 49*  CREATININE 2.64*  2.56*  < > 2.79* 2.67* 2.65* 2.42* 2.81*  CALCIUM 8.8*  8.8*  < > 7.5* 7.6* 7.4* 7.6* 7.5*  MG 2.3  --   --   --   --   --   --   PHOS 5.1*  5.0*  --   --   --   --   --   --   < > = values in this interval not displayed. Liver Function Tests:  Recent Labs Lab 05/14/15 1645  ALBUMIN 2.5*   No results for input(s): LIPASE, AMYLASE in the last 168 hours. No results for input(s): AMMONIA in the last 168 hours. CBC:  Recent Labs Lab 05/14/15 1155 05/14/15 1645  05/16/15 0330 05/17/15 0340 05/18/15 0306 05/19/15 0500 05/20/15 0420  WBC 10.8* 12.3*  < > 10.4 11.1* 11.4* 9.9 10.2  NEUTROABS 8.3* 9.4*  --   --   --   --   --   --   HGB 9.9* 9.0*  < > 7.3* 7.6* 7.4* 8.3* 8.7*  HCT 30.2* 28.3*  < > 23.0* 24.0* 22.9* 25.4* 26.4*  MCV 83.0 84.2  < > 83.6 83.9 85.1 82.7 83.0  PLT 338 309  < > 259 266 267 269 276  < > = values in this interval not displayed. Cardiac Enzymes: No results for input(s): CKTOTAL, CKMB, CKMBINDEX, TROPONINI in the last 168 hours. BNP (last 3 results)  Recent Labs  04/05/15 1439  BNP 175.9*    ProBNP (last 3 results) No results for input(s): PROBNP in the last 8760 hours.  CBG:  Recent Labs Lab 05/19/15 1201 05/19/15 1623 05/19/15 2201 05/20/15 0735 05/20/15 1142  GLUCAP 146* 152* 166* 149* 171*    Recent Results (from the past 240 hour(s))  Blood culture  (routine x 2)     Status: None   Collection Time: 05/14/15 11:51 AM  Result Value Ref Range Status   Specimen Description BLOOD RIGHT ANTECUBITAL  Final   Special Requests BOTTLES DRAWN AEROBIC AND ANAEROBIC 5ML  Final   Culture   Final    NO GROWTH 5 DAYS [facility]    Report Status 05/19/2015 FINAL  Final  Blood culture (routine x 2)     Status: None   Collection Time: 05/14/15  1:23 PM  Result Value Ref Range Status   Specimen Description BLOOD LEFT HAND  Final   Special Requests BOTTLES DRAWN AEROBIC AND ANAEROBIC 5CC  Final   Culture   Final    NO GROWTH 5 DAYS Performed at [facility]    Report Status 05/19/2015 FINAL  Final  MRSA PCR Screening     Status: None   Collection Time: 05/14/15  4:26 PM  Result Value Ref Range Status   MRSA by PCR NEGATIVE NEGATIVE Final    Comment:        The GeneXpert MRSA Assay (FDA approved for NASAL specimens only), is one component of a comprehensive MRSA colonization surveillance program. It is not intended to diagnose MRSA infection nor to guide or monitor treatment for MRSA infections.      Studies: No results found.  Scheduled Meds: . amLODipine  5 mg Oral Daily  . apixaban  2.5 mg Oral BID WC  . carvedilol  12.5 mg Oral BID WC  . clindamycin (CLEOCIN) IV  600 mg Intravenous Q8H  . cloNIDine  0.1 mg Oral TID  . feeding supplement (GLUCERNA SHAKE)  237 mL Oral q morning - 10a  . feeding supplement (PRO-STAT SUGAR FREE 64)  30 mL Oral Daily  . furosemide  100 mg Intravenous Q12H  . insulin aspart  0-15 Units Subcutaneous TID WC  . insulin aspart  0-5 Units Subcutaneous QHS  . nutrition supplement (JUVEN)  1 packet Oral BID BM  . sodium chloride  3 mL Intravenous Q12H  . tamsulosin  0.4 mg Oral Daily   Continuous Infusions:    Principal Problem:   Cellulitis of fourth toe of right foot Active Problems:   Diabetic peripheral neuropathy (HCC)   Acute encephalopathy   S/P BKA (below knee  amputation) unilateral (HCC)   History of pulmonary embolism   Acute renal failure superimposed on stage 4 chronic kidney disease (HCC)   Hyperkalemia   Metabolic acidosis   Uncontrolled diabetes mellitus with circulatory complication, with long-term current use of insulin (HCC)   Anemia of chronic renal failure, stage 4 (severe) (HCC)   Benign essential HTN   Chronic combined systolic and diastolic CHF (congestive heart failure) (HCC)   Leukocytosis   Camarion Weier K  [facility] Pager [phone]. If 7PM-7AM, please contact night-coverage at [email or web address], password [facility] 05/20/2015, 3:53 PM  LOS: 6 days

## 2015-05-20 NOTE — Progress Notes (Addendum)
Nutrition Follow-up  DOCUMENTATION CODES:   Obesity unspecified  INTERVENTION:  - Continue Juven BID and 30 mL Prostat once/day - Will order Glucerna Shake once/day, this supplement provides 220 kcal and 10 grams of protein and monitor for tolerance/acceptance of this supplement - Encourage PO intakes of meals and supplements - RD will continue to monitor for needs  NUTRITION DIAGNOSIS:   Increased nutrient needs related to wound healing as evidenced by estimated needs. -ongoing  GOAL:   Patient will meet greater than or equal to 90% of their needs -unmet on average  MONITOR:   PO intake, Supplement acceptance, Weight trends, Labs, Skin, I & O's  ASSESSMENT:   69 year old male with past medical history of hypertension, CKD stage 4 (baseline Cr 2.4), left BKA, history of pulmonary embolism (on anticoagulation with apixaban) diabetes mellitus on insulin, recently hospitalized for acute encephalopathy thought to be due to acute infection, UTI. [facility] with worsening mental status changes per his wife who provided most of the history of present illness. Mental status changes started about 1-2 days prior to this admission. In addition, he had worsening wound on his right 4th toe over past few days and home nurse was concerned about possible infection. Wife also said his right leg was more red and swollen in past few days  12/12 Nutrition-related needs updated based on current medical course, age. Per chart review, pt ate 50% of lunch 12/9, 75% of breakfast 12/10, and 50% of lunch 12/11. Pt states that he has not yet received breakfast this AM. He does not recall trying Prostat supplement and states that his wife has mixed Juven powder into liquid in the past but he does not recall consuming this supplement either. Pt seemed to have some slight confusion during discussion based on responses and talking about irrelevant information.   Pt not meeting needs at this time. Will  continue to monitor intakes and adjust supplements as needed. Medications reviewed. Labs reviewed; BUN/creatinine elevated and trending up, Ca: 7.5 mg/dL, GFR: 25, CBGs: [phone] with one outlier of 341 mg/dL.  ADDENDUM: Noted pt missing the majority of his teeth. He states that he has trouble chewing foods and needs softer-texture foods related to this.     12/7 - Pt is s/p BKA 07/2013 - He now has a poorly healing diabetic foot ulcer on his 4th toe; appears to be infected; celluitis. - Pt also has Stg II pressure ulcer on buttocks. - Wife admits patient hasn't eaten well since Thanksgiving when "he first got sick." - Pt is feeling better now, most recent PO intake 75%. - Will provide Juven BID, PS Q24H to help with healing.  Diet Order:  Diet Carb Modified Fluid consistency:: Thin; Room service appropriate?: Yes  Skin:  Wound (see comment) (Stg II PU to buttocks, Infected 4th toe of foot.)  Last BM:  12/9  Height:   Ht Readings from Last 1 Encounters:  05/20/15 5\' 10"  (1.778 m)    Weight:   Wt Readings from Last 1 Encounters:  05/20/15 296 lb 4.8 oz (134.4 kg)    Ideal Body Weight:  64.54 kg  BMI:  Body mass index is 42.51 kg/(m^2).  Estimated Nutritional Needs:   Kcal:  2000-2250  Protein:  115-125 grams  Fluid:  >/= 2.2L  EDUCATION NEEDS:   No education needs identified at this time     Trenton GammonJessica Silva Aamodt, RD, LDN Inpatient Clinical Dietitian Pager # [phone] After hours/weekend pager # [phone]

## 2015-05-20 NOTE — Progress Notes (Signed)
PT Cancellation Note  Patient Details Name: Justin HansenDonald Vanmetre Sr. MRN: [id number] DOB: 06/20/1945   Cancelled Treatment:    Reason Eval/Treat Not Completed: attempted eval-pt and wife requested PT check back on tomorrow.    [name] AlertJannie Justice Milliron, MPT Pager: [phone]

## 2015-05-20 NOTE — Progress Notes (Signed)
OT Cancellation Note  Patient Details Name: Justin HansenDonald Armbrister Sr. MRN: [id number] DOB: 12/11/1945   Cancelled Treatment:    Reason Eval/Treat Not Completed: Fatigue/lethargy limiting ability to participate  Will recheck on pt next day  Gaudencio Chesnut, Metro KungLorraine D 05/20/2015, 3:44 PM

## 2015-05-20 NOTE — Progress Notes (Signed)
Date: May 20, 2015 Chart reviewed for concurrent status and case management needs. Will continue to follow patient for changes and needs: Rhonda Davis, RN, BSN, CCM   [phone] 

## 2015-05-20 NOTE — Progress Notes (Signed)
[facility] Progress Note   Subjective: 2.9 liters UOP yest. Creat up slightly 2.81 today  Filed Vitals:   05/20/15 0500 05/20/15 0631 05/20/15 1053 05/20/15 1404  BP:  152/75 140/52 131/53  Pulse:  62 63 69  Temp:  98.3 F (36.8 C) 97.7 F (36.5 C) 99.8 F (37.7 C)  TempSrc:  Oral Oral Oral  Resp:  $Remo'18 19 18  'Wmtpb$ Height: $Rem'5\' 10"'ghis$  (1.778 m)     Weight: 133.1 kg (293 lb 6.9 oz) 134.4 kg (296 lb 4.8 oz)    SpO2:  98% 97% 98%    Inpatient medications: . amLODipine  5 mg Oral Daily  . apixaban  2.5 mg Oral BID WC  . carvedilol  12.5 mg Oral BID WC  . clindamycin (CLEOCIN) IV  600 mg Intravenous Q8H  . cloNIDine  0.1 mg Oral TID  . feeding supplement (GLUCERNA SHAKE)  237 mL Oral q morning - 10a  . feeding supplement (PRO-STAT SUGAR FREE 64)  30 mL Oral Daily  . furosemide  100 mg Intravenous Q12H  . insulin aspart  0-15 Units Subcutaneous TID WC  . insulin aspart  0-5 Units Subcutaneous QHS  . nutrition supplement (JUVEN)  1 packet Oral BID BM  . sodium chloride  3 mL Intravenous Q12H  . tamsulosin  0.4 mg Oral Daily     acetaminophen **OR** acetaminophen, bisacodyl, HYDROcodone-acetaminophen, hydrOXYzine, LORazepam, ondansetron **OR** ondansetron (ZOFRAN) IV, sodium chloride, traMADol  Exam: Elderly AAM no distress, gen weakness No jvd Chest dec'd bilat bases RRR no mrg Abd obese soft ntnd +bs 2-3+ edema bilat hips/ LE's  R leg in brace, R foot wrapped L BKA Neuro bed bound, gen weakness, nonfocal  CXR 10/28 no edema I/O 6L in and 3L out since admitted Last ECHO 2014  LVEF 50-55%     Assessment: 1 CKD stage 3/4 baseline creat 1.5 - 2.8 (quite variable over last 3 yrs). Severely debilitated and not a candidate for dialysis. Have d/w pt's wife who understands.  2 Vol excess/ anasarca LE's - on IV lasix, still lots of edema 3 PVD for R BKA tomorrow 4 Debil lives in SNF / hx L BKA 5 Met acidosis better 6 DNR 7 S/P R foot toe amps 12/10 8 EOL/ DNR - d/w  pt's wife, he was on home hospice but was dc'd recently. They now have palliative care contacts/ NP.     Plan - decrease lasix 100 IV bid   Kelly Splinter MD [facility] pager [phone]    cell [phone] 05/20/2015, 3:33 PM    Recent Labs Lab 05/14/15 1645  05/18/15 0306 05/19/15 0500 05/20/15 0420  NA 144  144  < > 141 140 136  K 6.5*  6.5*  < > 4.6 3.9 3.9  CL 120*  120*  < > 111 109 105  CO2 19*  18*  < > $R'23 23 24  'wP$ GLUCOSE 77  78  < > 182* 169* 193*  BUN 50*  51*  < > 49* 42* 49*  CREATININE 2.64*  2.56*  < > 2.65* 2.42* 2.81*  CALCIUM 8.8*  8.8*  < > 7.4* 7.6* 7.5*  PHOS 5.1*  5.0*  --   --   --   --   < > = values in this interval not displayed.  Recent Labs Lab 05/14/15 1645  ALBUMIN 2.5*    Recent Labs Lab 05/14/15 1155 05/14/15 1645  05/18/15 0306 05/19/15 0500 05/20/15 0420  WBC 10.8* 12.3*  < >  11.4* 9.9 10.2  NEUTROABS 8.3* 9.4*  --   --   --   --   HGB 9.9* 9.0*  < > 7.4* 8.3* 8.7*  HCT 30.2* 28.3*  < > 22.9* 25.4* 26.4*  MCV 83.0 84.2  < > 85.1 82.7 83.0  PLT 338 309  < > 267 269 276  < > = values in this interval not displayed.

## 2015-05-20 NOTE — Progress Notes (Signed)
Patient ID: Justin HansenDonald Seth Sr., male   DOB: 08/31/1945, 69 y.o.   MRN: [id number] Tolerated surgery well yesterday.  Will leave current dressing on right foot for a week at least.

## 2015-05-21 LAB — BASIC METABOLIC PANEL
Anion gap: 8 (ref 5–15)
BUN: 52 mg/dL — AB (ref 6–20)
CO2: 24 mmol/L (ref 22–32)
CREATININE: 2.6 mg/dL — AB (ref 0.61–1.24)
Calcium: 7.9 mg/dL — ABNORMAL LOW (ref 8.9–10.3)
Chloride: 109 mmol/L (ref 101–111)
GFR calc Af Amer: 27 mL/min — ABNORMAL LOW (ref >60)
GFR, EST NON AFRICAN AMERICAN: 23 mL/min — AB (ref >60)
Glucose, Bld: 169 mg/dL — ABNORMAL HIGH (ref 65–99)
POTASSIUM: 3.6 mmol/L (ref 3.5–5.1)
SODIUM: 141 mmol/L (ref 135–145)

## 2015-05-21 LAB — GLUCOSE, CAPILLARY
GLUCOSE-CAPILLARY: 148 mg/dL — AB (ref 65–99)
GLUCOSE-CAPILLARY: 200 mg/dL — AB (ref 65–99)
Glucose-Capillary: 166 mg/dL — ABNORMAL HIGH (ref 65–99)
Glucose-Capillary: 154 mg/dL — ABNORMAL HIGH (ref 65–99)

## 2015-05-21 LAB — CBC
HCT: 24.7 % — ABNORMAL LOW (ref 39.0–52.0)
Hemoglobin: 8.2 g/dL — ABNORMAL LOW (ref 13.0–17.0)
MCH: 27.5 pg (ref 26.0–34.0)
MCHC: 33.2 g/dL (ref 30.0–36.0)
MCV: 82.9 fL (ref 78.0–100.0)
PLATELETS: 253 10*3/uL (ref 150–400)
RBC: 2.98 MIL/uL — ABNORMAL LOW (ref 4.22–5.81)
RDW: 13.3 % (ref 11.5–15.5)
WBC: 10 10*3/uL (ref 4.0–10.5)

## 2015-05-21 NOTE — Progress Notes (Signed)
PT Cancellation Note/ Screen  Patient Details Name: Mitzi HansenDonald Edgar Sr. MRN: [id number] DOB: 08/18/1945   Cancelled Treatment:    Reason Eval/Treat Not Completed: PT screened, no needs identified, will sign off Pt with recent admission requiring +2 for bed mobility, and SNF was recommended at that time.  Per chart, pt is active with hospice.  Pt reports he does not get out of bed at home or transfer.  Pt seems a little confused and asked "how heavy is the lift?" when discussing if he had hoyer lift at home.  Pt does not appear to be appropriate for PT at this time.  Discussed with RN and requested reorder if information incorrect (no family present).  If pt does not have hoyer lift and w/c at home, may benefit to assist with decreasing burden of care and improving quality of life.   Kemper Heupel,KATHrine E 05/21/2015, 10:21 AM Zenovia JarredKati Amandy Chubbuck, PT, DPT 05/21/2015 Pager: [phone]

## 2015-05-21 NOTE — Progress Notes (Signed)
[facility] PROGRESS NOTE  [name]. ZOX:[id number] DOB: April 22, 1946 DOA: 05/14/2015 PCP: [name], MD  HPI/Brief narrative 69 year old male with past medical history of hypertension, CKD stage 4 (baseline Cr 2.4), left BKA, history of pulmonary embolism (on anticoagulation with apixaban) diabetes mellitus on insulin, recently hospitalized for acute encephalopathy thought to be due to acute infection from presumed UTI as well as possible R foot cellulitis. The patient was admitted for further work up.  Assessment/Plan:  Cellulitis of right foot, 4th toe / Leukocytosis - Patient initially presented with necrotic R 4th and 5th digits with concerns for underlying infection.  - Patient has been continued on clindamycin - WOC was consulted.  - R sided abi performed on 12/7 and demonstrated severe loss of flow with abnormal waveforms and flat great toe - Dr. Magnus Ivan was consulted and patient has since undergone R third through fifth ray amputation/resection on 12/11   Acute encephalopathy - Likely from acute infection, acute on chronic renal failure - Seems stable thus far   S/P BKA (below knee amputation) unilateral (HCC) - Stable    History of pulmonary embolism - Patient is continued on apixaban post-op - Patient was on heparin gtt bridge prior to surgery   Acute renal failure superimposed on stage 4 chronic kidney disease (HCC) / Metabolic acidosis - Baseline Cr 2.4 - Cr today 2.6 - Nephrology was consulted and is following. Appreciate input - Currently remains on lasix gtt per Nephrology - Patient is not candidate for dialysis   Hyperkalemia - Due to acute on chronic renal failure as well as potassium supplementation  - Given insulin, calcium gluconate and albuterol in ED - Potassium level currently remains normal   Uncontrolled diabetes mellitus with circulatory complication, with long-term current use of insulin (HCC) / Diabetic peripheral neuropathy  (HCC) - A1c in 2015 was 8 - A1c on this admission 7.2 - Continue SSI coverage.    Anemia of chronic renal failure, stage 4 (severe) (HCC) - Hemoglobin overall stable at 8.3 after one unit PRBC's   Benign essential HTN - Continue Norvasc, carvedilol and clonidine   Acute on Chronic combined systolic and diastolic CHF (congestive heart failure) (HCC)  - Clinically mildly volume overloaded - Patient on lasix gtt per Nephrology - Last 2 D ECHO in 03/2013 with EF 50% grade 1 diastolic dysfunction  DVT prophylaxis:  - on therapeutic anticoagulation  Code Status: DNR Family Communication: Pt in room, wife at bedside Disposition Plan: Pending PT/OT eval  Consultants:  Nephrology - Dr. Hyman Hopes  Vascular Surgery - Dr. Hart Rochester  Orthopedic Surgery - Dr. Magnus Ivan  Procedures:    Antibiotics: Anti-infectives    Start     Dose/Rate Route Frequency Ordered Stop   05/15/15 0200  clindamycin (CLEOCIN) IVPB 600 mg     600 mg 100 mL/hr over 30 Minutes Intravenous Every 8 hours 05/14/15 1826     05/14/15 1445  clindamycin (CLEOCIN) IVPB 600 mg     600 mg 100 mL/hr over 30 Minutes Intravenous  Once 05/14/15 1435 05/14/15 1847      HPI/Subjective: Patient is without complaints. Denies sob  Objective: Filed Vitals:   05/20/15 2112 05/21/15 0439 05/21/15 0631 05/21/15 1337  BP: 125/50  141/51 113/50  Pulse: 66  68 71  Temp: 98.1 F (36.7 C)  98.6 F (37 C) 99 F (37.2 C)  TempSrc: Oral  Oral Oral  Resp: Height:   (1.778 m)    Weight:  131.4 kg (289 lb 11 oz)    SpO2: 99%  100% 98%    Intake/Output Summary (Last 24 hours) at 05/21/15 1500 Last data filed at 05/21/15 1247  Gross per 24 hour  Intake    480 ml  Output    200 ml  Net    280 ml   Filed Weights   05/20/15 0500 05/20/15 0631 05/21/15 0439  Weight: 133.1 kg (293 lb 6.9 oz) 134.4 kg (296 lb 4.8 oz) 131.4 kg (289 lb 11 oz)    Exam:   General:  Awake, laying in bed, in  nad  Cardiovascular: regular, s1, s2  Respiratory: normal resp effort, no wheezing  Abdomen: soft, nondistended, pos BS  Musculoskeletal: s/p prior LLE amputation, RLE with post-op dressings inplace, no drainage  Data Reviewed: Basic Metabolic Panel:  Recent Labs Lab 05/14/15 1645  05/17/15 0340 05/18/15 0306 05/19/15 0500 05/20/15 0420 05/21/15 0456  NA 144  144  < > 140 141 140 136 141  K 6.5*  6.5*  < > 5.3* 4.6 3.9 3.9 3.6  CL 120*  120*  < > 108 111 109 105 109  CO2 19*  18*  < > 25 23 23 24 24   GLUCOSE 77  78  < > 181* 182* 169* 193* 169*  BUN 50*  51*  < > 54* 49* 42* 49* 52*  CREATININE 2.64*  2.56*  < > 2.67* 2.65* 2.42* 2.81* 2.60*  CALCIUM 8.8*  8.8*  < > 7.6* 7.4* 7.6* 7.5* 7.9*  MG 2.3  --   --   --   --   --   --   PHOS 5.1*  5.0*  --   --   --   --   --   --   < > = values in this interval not displayed. Liver Function Tests:  Recent Labs Lab 05/14/15 1645  ALBUMIN 2.5*   No results for input(s): LIPASE, AMYLASE in the last 168 hours. No results for input(s): AMMONIA in the last 168 hours. CBC:  Recent Labs Lab 05/14/15 1645  05/17/15 0340 05/18/15 0306 05/19/15 0500 05/20/15 0420 05/21/15 0456  WBC 12.3*  < > 11.1* 11.4* 9.9 10.2 10.0  NEUTROABS 9.4*  --   --   --   --   --   --   HGB 9.0*  < > 7.6* 7.4* 8.3* 8.7* 8.2*  HCT 28.3*  < > 24.0* 22.9* 25.4* 26.4* 24.7*  MCV 84.2  < > 83.9 85.1 82.7 83.0 82.9  PLT 309  < > 266 267 269 276 253  < > = values in this interval not displayed. Cardiac Enzymes: No results for input(s): CKTOTAL, CKMB, CKMBINDEX, TROPONINI in the last 168 hours. BNP (last 3 results)  Recent Labs  04/05/15 1439  BNP 175.9*    ProBNP (last 3 results) No results for input(s): PROBNP in the last 8760 hours.  CBG:  Recent Labs Lab 05/20/15 1142 05/20/15 1721 05/20/15 2107 05/21/15 0736 05/21/15 1131  GLUCAP 171* 161* 164* 148* 200*    Recent Results (from the past 240 hour(s))  Blood culture  (routine x 2)     Status: None   Collection Time: 05/14/15 11:51 AM  Result Value Ref Range Status   Specimen Description BLOOD RIGHT ANTECUBITAL  Final   Special Requests BOTTLES DRAWN AEROBIC AND ANAEROBIC 5ML  Final   Culture   Final    NO GROWTH 5 DAYS Per[facility]  Report Status 05/19/2015 FINAL  Final  Blood culture (routine x 2)     Status: None   Collection Time: 05/14/15  1:23 PM  Result Value Ref Range Status   Specimen Description BLOOD LEFT HAND  Final   Special Requests BOTTLES DRAWN AEROBIC AND ANAEROBIC 5CC  Final   Culture   Final    NO GROWTH 5 DAYS Performed at [facility]    Report Status 05/19/2015 FINAL  Final  MRSA PCR Screening     Status: None   Collection Time: 05/14/15  4:26 PM  Result Value Ref Range Status   MRSA by PCR NEGATIVE NEGATIVE Final    Comment:        The GeneXpert MRSA Assay (FDA approved for NASAL specimens only), is one component of a comprehensive MRSA colonization surveillance program. It is not intended to diagnose MRSA infection nor to guide or monitor treatment for MRSA infections.      Studies: No results found.  Scheduled Meds: . amLODipine  5 mg Oral Daily  . apixaban  2.5 mg Oral BID WC  . carvedilol  12.5 mg Oral BID WC  . clindamycin (CLEOCIN) IV  600 mg Intravenous Q8H  . cloNIDine  0.1 mg Oral TID  . feeding supplement (GLUCERNA SHAKE)  237 mL Oral q morning - 10a  . feeding supplement (PRO-STAT SUGAR FREE 64)  30 mL Oral Daily  . furosemide  100 mg Intravenous Q12H  . insulin aspart  0-15 Units Subcutaneous TID WC  . insulin aspart  0-5 Units Subcutaneous QHS  . nutrition supplement (JUVEN)  1 packet Oral BID BM  . sodium chloride  3 mL Intravenous Q12H  . tamsulosin  0.4 mg Oral Daily   Continuous Infusions:    Principal Problem:   Cellulitis of fourth toe of right foot Active Problems:   Diabetic peripheral neuropathy (HCC)   Acute encephalopathy   S/P BKA (below knee  amputation) unilateral (HCC)   History of pulmonary embolism   Acute renal failure superimposed on stage 4 chronic kidney disease (HCC)   Hyperkalemia   Metabolic acidosis   Uncontrolled diabetes mellitus with circulatory complication, with long-term current use of insulin (HCC)   Anemia of chronic renal failure, stage 4 (severe) (HCC)   Benign essential HTN   Chronic combined systolic and diastolic CHF (congestive heart failure) (HCC)   Leukocytosis   CHIU, STEPHEN K  [facility] Pager [phone]. If 7PM-7AM, please contact night-coverage at [email or web address], password [facility] 05/21/2015, 3:00 PM  LOS: 7 days

## 2015-05-21 NOTE — Progress Notes (Signed)
[facility] Progress Note   Subjective: creat down slightly 2.6.  No UOP recorded yesterday  Filed Vitals:   05/20/15 2112 05/21/15 0439 05/21/15 0631 05/21/15 1337  BP: 125/50  141/51 113/50  Pulse: 66  68 71  Temp: 98.1 F (36.7 C)  98.6 F (37 C) 99 F (37.2 C)  TempSrc: Oral  Oral Oral  Resp: $Remo'18  19 20  'Yfckp$ Height:  $Remove'5\' 10"'FiLwZQA$  (1.778 m)    Weight:  131.4 kg (289 lb 11 oz)    SpO2: 99%  100% 98%    Inpatient medications: . amLODipine  5 mg Oral Daily  . apixaban  2.5 mg Oral BID WC  . carvedilol  12.5 mg Oral BID WC  . clindamycin (CLEOCIN) IV  600 mg Intravenous Q8H  . cloNIDine  0.1 mg Oral TID  . feeding supplement (GLUCERNA SHAKE)  237 mL Oral q morning - 10a  . feeding supplement (PRO-STAT SUGAR FREE 64)  30 mL Oral Daily  . furosemide  100 mg Intravenous Q12H  . insulin aspart  0-15 Units Subcutaneous TID WC  . insulin aspart  0-5 Units Subcutaneous QHS  . nutrition supplement (JUVEN)  1 packet Oral BID BM  . sodium chloride  3 mL Intravenous Q12H  . tamsulosin  0.4 mg Oral Daily     acetaminophen **OR** acetaminophen, bisacodyl, HYDROcodone-acetaminophen, hydrOXYzine, LORazepam, ondansetron **OR** ondansetron (ZOFRAN) IV, sodium chloride, traMADol  Exam: Elderly AAM no distress, gen weakness No jvd Chest dec'd bilat bases RRR no mrg Abd obese soft ntnd +bs 2-3+ edema bilat hips/ LE's  R leg in brace, R foot wrapped L BKA Neuro bed bound, gen weakness, nonfocal  CXR 10/28 no edema I/O 6L in and 3L out since admitted Last ECHO 2014  LVEF 50-55%     Assessment: 1 CKD stage 3/4 baseline creat 1.5 - 2.8 (quite variable over last 3 yrs). Severely debilitated and not a candidate for dialysis. Have d/w pt's wife.   2 Vol excess/ anasarca LE's - on IV lasix, has sig vol overload 3 PVD for R BKA tomorrow 4 Debil lives in SNF / hx L BKA 5 Met acidosis better 6 DNR 7 S/P R foot toe amps 12/10 8 EOL/ DNR - d/w pt's wife, he was on home hospice but  was dc'd recently. They now have palliative care contacts/ NP.     Plan - would continue IV lasix as long as patient here, when ready for dc change to po lasix 80-120 bid to tid assuming still edematous. Not candidate for HD due to severe comorbidities and debility. Will sign off, have d/w primary MD.    Kelly Splinter MD [facility] pager [phone]    cell [phone] 05/21/2015, 3:47 PM    Recent Labs Lab 05/14/15 1645  05/19/15 0500 05/20/15 0420 05/21/15 0456  NA 144  144  < > 140 136 141  K 6.5*  6.5*  < > 3.9 3.9 3.6  CL 120*  120*  < > 109 105 109  CO2 19*  18*  < > $R'23 24 24  'XG$ GLUCOSE 77  78  < > 169* 193* 169*  BUN 50*  51*  < > 42* 49* 52*  CREATININE 2.64*  2.56*  < > 2.42* 2.81* 2.60*  CALCIUM 8.8*  8.8*  < > 7.6* 7.5* 7.9*  PHOS 5.1*  5.0*  --   --   --   --   < > = values in this interval not  displayed.  Recent Labs Lab 05/14/15 1645  ALBUMIN 2.5*    Recent Labs Lab 05/14/15 1645  05/19/15 0500 05/20/15 0420 05/21/15 0456  WBC 12.3*  < > 9.9 10.2 10.0  NEUTROABS 9.4*  --   --   --   --   HGB 9.0*  < > 8.3* 8.7* 8.2*  HCT 28.3*  < > 25.4* 26.4* 24.7*  MCV 84.2  < > 82.7 83.0 82.9  PLT 309  < > 269 276 253  < > = values in this interval not displayed.

## 2015-05-21 NOTE — Progress Notes (Signed)
OT Cancellation Note  Patient Details Name: Justin HansenDonald Monnin Sr. MRN: [id number] DOB: 12/17/1945   Cancelled Treatment:    Reason Eval/Treat Not Completed: OT screened, no needs identified, will sign off As per PT note and with speaking to nursing, pt does not appear appropriate for acute OT services. Will sign off at this time.  Lennox LaityStone, Kimberly Coye Stafford  [phone] 05/21/2015, 10:57 AM

## 2015-05-22 LAB — GLUCOSE, CAPILLARY
GLUCOSE-CAPILLARY: 137 mg/dL — AB (ref 65–99)
GLUCOSE-CAPILLARY: 202 mg/dL — AB (ref 65–99)
Glucose-Capillary: 143 mg/dL — ABNORMAL HIGH (ref 65–99)
Glucose-Capillary: 150 mg/dL — ABNORMAL HIGH (ref 65–99)

## 2015-05-22 NOTE — Progress Notes (Signed)
Called to assess PICC being pulled back by patient.   Arrived to find RT DL PICC pulled back to the 35cm mark.   IVFs had been turned off.   Wife stated,  "I came into the room and he had it in his hand pulling."   "I told him to stop but it was too late."  "He said it was itching"    I removed the PICC per policy.  Site was WNL.  Primary RN to notify MD.

## 2015-05-22 NOTE — Progress Notes (Addendum)
[facility] PROGRESS NOTE  Justin HansenDonald Yuan Sr. ZOX:[id number] DOB: 09/20/1945 DOA: 05/14/2015 PCP: Evlyn CourierHILL,[name], MD  HPI/Brief narrative 69 year old male with past medical history of hypertension, CKD stage 4 (baseline Cr 2.4), left BKA, history of pulmonary embolism (on anticoagulation with apixaban) diabetes mellitus on insulin, recently hospitalized for acute encephalopathy thought to be due to acute infection from presumed UTI as well as possible R foot cellulitis. The patient was admitted for further work up.  Assessment/Plan:  Cellulitis of right foot, 4th toe / Leukocytosis - Patient initially presented with necrotic R 4th and 5th digits with infection.  - on IV Clindamycin day 8, will change to PO for 2more days - WOC was consulted.  - R ABI performed on 12/7 and demonstrated severe loss of flow with abnormal waveforms and flat great toe - Dr. Magnus IvanBlackman was consulted and patient has since undergone R third through fifth ray amputation/resection on 12/11 -continue wound care, overall prognosis very poor due to host of other issues too   Acute encephalopathy - Likely from acute infection, acute on chronic renal failure - Seems stable thus far   S/P left BKA (below knee amputation) unilateral (HCC) - Stable    History of pulmonary embolism - Patient is continued on apixaban post-op - Patient was on heparin gtt bridge prior to surgery   Acute renal failure superimposed on stage 4 chronic kidney disease (HCC) / Metabolic acidosis - Baseline Cr 2.4, with volume overload - Cr stable,  Nephrology was consulted diuresed with high dose IV lasix gtt - Per Renal : Not candidate for HD due to severe comorbidities and debility - negative 800cc only, continue IV lasix today and change to PO lasix per Renal recs tomorrow   Hyperkalemia - Due to acute on chronic renal failure as well as potassium supplementation  - Given insulin, calcium gluconate and albuterol in ED -  resolved   Uncontrolled diabetes mellitus with circulatory complication, with long-term current use of insulin (HCC) / Diabetic peripheral neuropathy (HCC) - A1c on this admission 7.2 - Continue SSI coverage.    Anemia of chronic renal failure, stage 4 (severe) (HCC) - s/p one unit PRBC this admission, hb stable   Benign essential HTN - Continue Norvasc, carvedilol and clonidine   Acute on Chronic combined systolic and diastolic CHF (congestive heart failure) (HCC)  - Clinically mildly volume overloaded - Patient on lasix gtt per Nephrology - Last 2 D ECHO in 03/2013 with EF 50% grade 1 diastolic dysfunction -change to PO lasix in 1-2days  DVT prophylaxis:  - on therapeutic anticoagulation  GLOBAL: Very poor quality of life and multiple chronic illnesses,not HD candidate He was followed by Hospice at home and was discharged from hospice recently, will need continued Palliative care FU Attempted to contact wife this afternoon, will try again later  Code Status: DNR Family Communication: son in law in [place], left msg for wife Disposition Plan: Home with hospice in 1-2days Consultants:  Nephrology - Dr. Hyman HopesWebb  Vascular Surgery - Dr. Hart RochesterLawson  Orthopedic Surgery - Dr. Magnus IvanBlackman  Procedures:    Antibiotics: Anti-infectives    Start     Dose/Rate Route Frequency Ordered Stop   05/15/15 0200  clindamycin (CLEOCIN) IVPB 600 mg     600 mg 100 mL/hr over 30 Minutes Intravenous Every 8 hours 05/14/15 1826     05/14/15 1445  clindamycin (CLEOCIN) IVPB 600 mg     600 mg 100 mL/hr over 30 Minutes Intravenous  Once 05/14/15 1435  05/14/15 1847      HPI/Subjective: Feels weak, no localizing symptoms  Objective: Filed Vitals:   05/21/15 2145 05/22/15 0400 05/22/15 0500 05/22/15 1457  BP: 124/55  138/62 131/57  Pulse: 66  72 78  Temp: 98.8 F (37.1 C)  98.5 F (36.9 C) 100.3 F (37.9 C)  TempSrc: Oral  Oral Oral  Resp: Height:   (1.778 m)    Weight:   131.8 kg (290 lb 9.1 oz)    SpO2: 97%  97% 97%    Intake/Output Summary (Last 24 hours) at 05/22/15 1604 Last data filed at 05/22/15 0811  Gross per 24 hour  Intake    360 ml  Output   1200 ml  Net   -840 ml   Filed Weights   05/20/15 0631 05/21/15 0439 05/22/15 0400  Weight: 134.4 kg (296 lb 4.8 oz) 131.4 kg (289 lb 11 oz) 131.8 kg (290 lb 9.1 oz)    Exam:   General:  Awake, laying in bed, in nad, chronically ill appearing  Cardiovascular: regular, s1, s2  Respiratory: normal resp effort, no wheezing  Abdomen: soft, nondistended, pos BS  Musculoskeletal: left BKA, RLE with post-op dressing  Data Reviewed: Basic Metabolic Panel:  Recent Labs Lab 05/17/15 0340 05/18/15 0306 05/19/15 0500 05/20/15 0420 05/21/15 0456  NA 140 141 140 136 141  K 5.3* 4.6 3.9 3.9 3.6  CL 108 111 109 105 109  CO2 GLUCOSE 181* 182* 169* 193* 169*  BUN 54* 49* 42* 49* 52*  CREATININE 2.67* 2.65* 2.42* 2.81* 2.60*  CALCIUM 7.6* 7.4* 7.6* 7.5* 7.9*   Liver Function Tests: No results for input(s): AST, ALT, ALKPHOS, BILITOT, PROT, ALBUMIN in the last 168 hours. No results for input(s): LIPASE, AMYLASE in the last 168 hours. No results for input(s): AMMONIA in the last 168 hours. CBC:  Recent Labs Lab 05/17/15 0340 05/18/15 0306 05/19/15 0500 05/20/15 0420 05/21/15 0456  WBC 11.1* 11.4* 9.9 10.2 10.0  HGB 7.6* 7.4* 8.3* 8.7* 8.2*  HCT 24.0* 22.9* 25.4* 26.4* 24.7*  MCV 83.9 85.1 82.7 83.0 82.9  PLT 266 267 269 276 253   Cardiac Enzymes: No results for input(s): CKTOTAL, CKMB, CKMBINDEX, TROPONINI in the last 168 hours. BNP (last 3 results)  Recent Labs  04/05/15 1439  BNP 175.9*    ProBNP (last 3 results) No results for input(s): PROBNP in the last 8760 hours.  CBG:  Recent Labs Lab 05/21/15 1131 05/21/15 1635 05/21/15 2135 05/22/15 0738 05/22/15 1146  GLUCAP 200* 166* 154* 143* 150*    Recent Results (from the past 240 hour(s))  Blood  culture (routine x 2)     Status: None   Collection Time: 05/14/15 11:51 AM  Result Value Ref Range Status   Specimen Description BLOOD RIGHT ANTECUBITAL  Final   Special Requests BOTTLES DRAWN AEROBIC AND ANAEROBIC  Final   Culture   Final    NO GROWTH 5 DAYS Performed at [facility]    Report Status 05/19/2015 FINAL  Final  Blood culture (routine x 2)     Status: None   Collection Time: 05/14/15  1:23 PM  Result Value Ref Range Status   Specimen Description BLOOD LEFT HAND  Final   Special Requests BOTTLES DRAWN AEROBIC AND ANAEROBIC 5CC  Final   Culture   Final    NO GROWTH 5 DAYS Performed at [facility]    Report Status  05/19/2015 FINAL  Final  MRSA PCR Screening     Status: None   Collection Time: 05/14/15  4:26 PM  Result Value Ref Range Status   MRSA by PCR NEGATIVE NEGATIVE Final    Comment:        The GeneXpert MRSA Assay (FDA approved for NASAL specimens only), is one component of a comprehensive MRSA colonization surveillance program. It is not intended to diagnose MRSA infection nor to guide or monitor treatment for MRSA infections.      Studies: No results found.  Scheduled Meds: . amLODipine  5 mg Oral Daily  . apixaban  2.5 mg Oral BID WC  . carvedilol  12.5 mg Oral BID WC  . clindamycin (CLEOCIN) IV  600 mg Intravenous Q8H  . cloNIDine  0.1 mg Oral TID  . feeding supplement (GLUCERNA SHAKE)  237 mL Oral q morning - 10a  . feeding supplement (PRO-STAT SUGAR FREE 64)  30 mL Oral Daily  . furosemide  100 mg Intravenous Q12H  . insulin aspart  0-15 Units Subcutaneous TID WC  . insulin aspart  0-5 Units Subcutaneous QHS  . nutrition supplement (JUVEN)  1 packet Oral BID BM  . sodium chloride  3 mL Intravenous Q12H  . tamsulosin  0.4 mg Oral Daily   Continuous Infusions:    Principal Problem:   Cellulitis of fourth toe of right foot Active Problems:   Diabetic peripheral neuropathy (HCC)   Acute encephalopathy   S/P BKA  (below knee amputation) unilateral (HCC)   History of pulmonary embolism   Acute renal failure superimposed on stage 4 chronic kidney disease (HCC)   Hyperkalemia   Metabolic acidosis   Uncontrolled diabetes mellitus with circulatory complication, with long-term current use of insulin (HCC)   Anemia of chronic renal failure, stage 4 (severe) (HCC)   Benign essential HTN   Chronic combined systolic and diastolic CHF (congestive heart failure) (HCC)   Leukocytosis   Pasqualina Colasurdo  [facility] Pager [phone]. If 7PM-7AM, please contact night-coverage at [email or web address], password [facility] 05/22/2015, 4:04 PM  LOS: 8 days

## 2015-05-23 ENCOUNTER — Encounter (HOSPITAL_COMMUNITY): Payer: Self-pay | Admitting: Orthopaedic Surgery

## 2015-05-23 DIAGNOSIS — N171 Acute kidney failure with acute cortical necrosis: Secondary | ICD-10-CM

## 2015-05-23 DIAGNOSIS — N179 Acute kidney failure, unspecified: Secondary | ICD-10-CM | POA: Insufficient documentation

## 2015-05-23 DIAGNOSIS — L03115 Cellulitis of right lower limb: Secondary | ICD-10-CM | POA: Insufficient documentation

## 2015-05-23 LAB — BASIC METABOLIC PANEL
ANION GAP: 11 (ref 5–15)
BUN: 63 mg/dL — ABNORMAL HIGH (ref 6–20)
CHLORIDE: 108 mmol/L (ref 101–111)
CO2: 24 mmol/L (ref 22–32)
Calcium: 7.9 mg/dL — ABNORMAL LOW (ref 8.9–10.3)
Creatinine, Ser: 2.79 mg/dL — ABNORMAL HIGH (ref 0.61–1.24)
GFR calc non Af Amer: 21 mL/min — ABNORMAL LOW (ref >60)
GFR, EST AFRICAN AMERICAN: 25 mL/min — AB (ref >60)
Glucose, Bld: 127 mg/dL — ABNORMAL HIGH (ref 65–99)
POTASSIUM: 3.8 mmol/L (ref 3.5–5.1)
Sodium: 143 mmol/L (ref 135–145)

## 2015-05-23 LAB — GLUCOSE, CAPILLARY
GLUCOSE-CAPILLARY: 151 mg/dL — AB (ref 65–99)
GLUCOSE-CAPILLARY: 172 mg/dL — AB (ref 65–99)
Glucose-Capillary: 140 mg/dL — ABNORMAL HIGH (ref 65–99)
Glucose-Capillary: 117 mg/dL — ABNORMAL HIGH (ref 65–99)

## 2015-05-23 MED ORDER — DOXYCYCLINE HYCLATE 100 MG PO TABS
100.0000 mg | ORAL_TABLET | Freq: Two times a day (BID) | ORAL | Status: DC
  Administered 2015-05-23 – 2015-05-24 (×3): 100 mg via ORAL
  Filled 2015-05-23 (×5): qty 1

## 2015-05-23 MED ORDER — FUROSEMIDE 80 MG PO TABS
80.0000 mg | ORAL_TABLET | Freq: Two times a day (BID) | ORAL | Status: DC
  Administered 2015-05-23 – 2015-05-24 (×3): 80 mg via ORAL
  Filled 2015-05-23 (×4): qty 1

## 2015-05-23 MED ORDER — POTASSIUM CHLORIDE CRYS ER 20 MEQ PO TBCR
40.0000 meq | EXTENDED_RELEASE_TABLET | Freq: Two times a day (BID) | ORAL | Status: DC
  Administered 2015-05-23 – 2015-05-24 (×3): 40 meq via ORAL
  Filled 2015-05-23 (×4): qty 2

## 2015-05-23 MED ORDER — LORAZEPAM 1 MG PO TABS: 1.0000 mg | ORAL_TABLET | Freq: Four times a day (QID) | ORAL | Status: DC | PRN

## 2015-05-23 NOTE — Progress Notes (Signed)
[facility] PROGRESS NOTE  Chalmer Zheng Sr. ZOX:[id number] DOB: [birth year] DOA: 05/14/2015 PCP: [name], MD  HPI/Brief narrative 69 year old male with past medical history of hypertension, CKD stage 4 (baseline Cr 2.4), left BKA, history of pulmonary embolism (on anticoagulation with apixaban) diabetes mellitus on insulin, recently hospitalized for acute encephalopathy thought to be due to acute infection from presumed UTI as well as possible R foot cellulitis. The patient was admitted for further work up.  Assessment/Plan:  Cellulitis of right foot, 4th toe / Leukocytosis - Patient initially presented with necrotic R 4th and 5th digits with infection.  - on IV Clindamycin s/p 9 days, change to Po Doxycycline for 4 more days - Right  ABI performed on 12/7 and demonstrated severe loss of flow with abnormal waveforms and flat great toe - Dr. Magnus Ivan was consulted and patient has since undergone R third through fifth ray amputation/resection on 12/11 -continue wound care, overall prognosis very poor due to host of other issues too -continue Wound care, FU with Dr.Blackman   Acute encephalopathy - Likely from acute infection, acute on chronic renal failure -improved and back to baseline   S/P left BKA (below knee amputation) unilateral (HCC) - Stable    History of pulmonary embolism - Patient is continued on apixaban post-op - Patient was on heparin gtt bridge prior to surgery   Acute renal failure superimposed on stage 4 chronic kidney disease (HCC) / Metabolic acidosis - Baseline Cr 2.4, with volume overload - Cr stable,  Nephrology was consulted diuresed with high dose IV lasix gtt - Per Renal : Not candidate for HD due to severe comorbidities and debility - negative 1.8L, without significant volume overload at thsi time, change to PO lasix at  BID   Hyperkalemia - Due to acute on chronic renal failure as well as potassium supplementation  - Given insulin,  calcium gluconate and albuterol in ED - resolved   Uncontrolled diabetes mellitus with circulatory complication, with long-term current use of insulin (HCC) / Diabetic peripheral neuropathy (HCC) - A1c on this admission 7.2 - Continue SSI coverage.    Anemia of chronic renal failure, stage 4 (severe) (HCC) - s/p one unit PRBC this admission, hb stable   Benign essential HTN - Continue Norvasc, carvedilol and clonidine   Acute on Chronic combined systolic and diastolic CHF (congestive heart failure) (HCC)  - Clinically mildly volume overloaded - Patient on lasix gtt per Nephrology - Last 2 D ECHO in 03/2013 with EF 50% grade 1 diastolic dysfunction -change to PO lasix today  DVT prophylaxis:  - on therapeutic anticoagulation  GLOBAL: Very poor quality of life and multiple chronic illnesses,not HD candidate He was followed by Hospice at home and was discharged from hospice recently, will need continued Palliative care FU Called and Updated Wife  Code Status: DNR Family Communication: son in law in [place], left msg for wife Disposition Plan: Home with Palliative FU tomorrow if stable  Consultants:  Nephrology - Dr. Hyman Hopes  Vascular Surgery - Dr. Hart Rochester  Orthopedic Surgery - Dr. Magnus Ivan  Procedures:    Antibiotics: Anti-infectives    Start     Dose/Rate Route Frequency Ordered Stop   05/23/15 1200  doxycycline (VIBRA-TABS) tablet 100 mg     100 mg Oral Every 12 hours 05/23/15 1116     05/15/15 0200  clindamycin (CLEOCIN) IVPB 600 mg  Status:  Discontinued     600 mg 100 mL/hr over 30 Minutes Intravenous Every 8 hours 05/14/15 1826  05/23/15 1116   05/14/15 1445  clindamycin (CLEOCIN) IVPB 600 mg     600 mg 100 mL/hr over 30 Minutes Intravenous  Once 05/14/15 1435 05/14/15 1847      HPI/Subjective: Feels weak, no localizing symptoms  Objective: Filed Vitals:   05/22/15 2150 05/23/15 0500 05/23/15 0700 05/23/15 1257  BP: 126/49  120/58 141/50  Pulse: 72  66 71   Temp: 98 F (36.7 C)  98.9 F (37.2 C) 98.2 F (36.8 C)  TempSrc: Oral  Oral Oral  Resp: Height:      Weight:  129.003 kg (284 lb 6.4 oz)    SpO2: 95%  100% 95%    Intake/Output Summary (Last 24 hours) at 05/23/15 1534 Last data filed at 05/23/15 0700  Gross per 24 hour  Intake    240 ml  Output    950 ml  Net   -710 ml   Filed Weights   05/21/15 0439 05/22/15 0400 05/23/15 0500  Weight: 131.4 kg (289 lb 11 oz) 131.8 kg (290 lb 9.1 oz) 129.003 kg (284 lb 6.4 oz)    Exam:   General:  Awake, laying in bed, in nad, chronically ill appearing  Cardiovascular: regular, s1, s2  Respiratory: normal resp effort, no wheezing  Abdomen: soft, nondistended, pos BS  Musculoskeletal: left BKA, RLE with post-op dressing  Data Reviewed: Basic Metabolic Panel:  Recent Labs Lab 05/18/15 0306 05/19/15 0500 05/20/15 0420 05/21/15 0456 05/23/15 1135  NA 141 140 136 141 143  K 4.6 3.9 3.9 3.6 3.8  CL 111 109 105 109 108  CO2 GLUCOSE 182* 169* 193* 169* 127*  BUN 49* 42* 49* 52* 63*  CREATININE 2.65* 2.42* 2.81* 2.60* 2.79*  CALCIUM 7.4* 7.6* 7.5* 7.9* 7.9*   Liver Function Tests: No results for input(s): AST, ALT, ALKPHOS, BILITOT, PROT, ALBUMIN in the last 168 hours. No results for input(s): LIPASE, AMYLASE in the last 168 hours. No results for input(s): AMMONIA in the last 168 hours. CBC:  Recent Labs Lab 05/17/15 0340 05/18/15 0306 05/19/15 0500 05/20/15 0420 05/21/15 0456  WBC 11.1* 11.4* 9.9 10.2 10.0  HGB 7.6* 7.4* 8.3* 8.7* 8.2*  HCT 24.0* 22.9* 25.4* 26.4* 24.7*  MCV 83.9 85.1 82.7 83.0 82.9  PLT 266 267 269 276 253   Cardiac Enzymes: No results for input(s): CKTOTAL, CKMB, CKMBINDEX, TROPONINI in the last 168 hours. BNP (last 3 results)  Recent Labs  04/05/15 1439  BNP 175.9*    ProBNP (last 3 results) No results for input(s): PROBNP in the last 8760 hours.  CBG:  Recent Labs Lab 05/22/15 1146 05/22/15 1632  05/22/15 2149 05/23/15 0741 05/23/15 1151  GLUCAP 150* 137* 202* 140* 117*    Recent Results (from the past 240 hour(s))  Blood culture (routine x 2)     Status: None   Collection Time: 05/14/15 11:51 AM  Result Value Ref Range Status   Specimen Description BLOOD RIGHT ANTECUBITAL  Final   Special Requests BOTTLES DRAWN AEROBIC AND ANAEROBIC  Final   Culture   Final    NO GROWTH 5 DAYS Performed at [facility]    Report Status 05/19/2015 FINAL  Final  Blood culture (routine x 2)     Status: None   Collection Time: 05/14/15  1:23 PM  Result Value Ref Range Status   Specimen Description BLOOD LEFT HAND  Final   Special Requests BOTTLES DRAWN AEROBIC AND ANAEROBIC  5CC  Final   Culture   Final    NO GROWTH 5 DAYS [facility]    Report Status 05/19/2015 FINAL  Final  MRSA PCR Screening     Status: None   Collection Time: 05/14/15  4:26 PM  Result Value Ref Range Status   MRSA by PCR NEGATIVE NEGATIVE Final    Comment:        The GeneXpert MRSA Assay (FDA approved for NASAL specimens only), is one component of a comprehensive MRSA colonization surveillance program. It is not intended to diagnose MRSA infection nor to guide or monitor treatment for MRSA infections.      Studies: No results found.  Scheduled Meds: . amLODipine  5 mg Oral Daily  . apixaban  2.5 mg Oral BID WC  . carvedilol  12.5 mg Oral BID WC  . cloNIDine  0.1 mg Oral TID  . doxycycline  100 mg Oral Q12H  . feeding supplement (GLUCERNA SHAKE)  237 mL Oral q morning - 10a  . feeding supplement (PRO-STAT SUGAR FREE 64)  30 mL Oral Daily  . furosemide  80 mg Oral BID  . insulin aspart  0-15 Units Subcutaneous TID WC  . insulin aspart  0-5 Units Subcutaneous QHS  . nutrition supplement (JUVEN)  1 packet Oral BID BM  . potassium chloride  40 mEq Oral BID  . sodium chloride  3 mL Intravenous Q12H  . tamsulosin  0.4 mg Oral Daily   Continuous Infusions:    Principal  Problem:   Cellulitis of fourth toe of right foot Active Problems:   Diabetic peripheral neuropathy (HCC)   Acute encephalopathy   S/P BKA (below knee amputation) unilateral (HCC)   History of pulmonary embolism   Acute renal failure superimposed on stage 4 chronic kidney disease (HCC)   Hyperkalemia   Metabolic acidosis   Uncontrolled diabetes mellitus with circulatory complication, with long-term current use of insulin (HCC)   Anemia of chronic renal failure, stage 4 (severe) (HCC)   Benign essential HTN   Chronic combined systolic and diastolic CHF (congestive heart failure) (HCC)   Leukocytosis   Ellamay Fors  [facility] Pager [phone]. If 7PM-7AM, please contact night-coverage at [email or web address], password [email or web address] 05/23/2015, 3:34 PM  LOS: 9 days

## 2015-05-23 NOTE — Care Management Important Message (Signed)
Important Message  Patient Details  Name: Justin HansenDonald Datta Sr. MRN: [id number] Date of Birth: 01/16/1946   Medicare Important Message Given:  Yes    Haskell FlirtJamison, Doloros Kwolek 05/23/2015, 12:17 PMImportant Message  Patient Details  Name: Justin HansenDonald Pesantez Sr. MRN: [id number] Date of Birth: 03/03/1946   Medicare Important Message Given:  Yes    Haskell FlirtJamison, Murray Durrell 05/23/2015, 12:16 PM

## 2015-05-23 NOTE — Progress Notes (Signed)
Nutrition Follow-up  DOCUMENTATION CODES:   Obesity unspecified  INTERVENTION:  - Continue supplement orders: Prostat once/day, Glucerna Shake once/day, Juven BID - Encourage PO intakes of meals and supplements - RD will continue to monitor for needs  NUTRITION DIAGNOSIS:   Increased nutrient needs related to wound healing as evidenced by estimated needs.  GOAL:   Patient will meet greater than or equal to 90% of their needs  MONITOR:   PO intake, Supplement acceptance, Weight trends, Labs, Skin, I & O's  REASON FOR ASSESSMENT:   Consult Wound healing  ASSESSMENT:   69 year old male with past medical history of hypertension, CKD stage 4 (baseline Cr 2.4), left BKA, history of pulmonary embolism (on anticoagulation with apixaban) diabetes mellitus on insulin, recently hospitalized for acute encephalopathy thought to be due to acute infection, UTI. He presented to [facility] ED with worsening mental status changes per his wife who provided most of the history of present illness. Mental status changes started about 1-2 days prior to this admission. In addition, he had worsening wound on his right 4th toe over past few days and home nurse was concerned about possible infection. Wife also said his right leg was more red and swollen in past few days  12/15 Per chart review, pt ate 50% of breakfast, 100% of lunch, and 25% of dinner 12/13 and yesterday (12/14) he ate 65% of breakfast and 100% of lunch. Pt drowsy during visit this AM and slightly mumbles answers but otherwise does not engage with RD. Bowl of oatmeal is in front of pt, untouched. Two unopened Glucerna Shake containers also on bedside table. Pt likely variably meeting minimal needs but will continue to monitor. Unable to determine intakes of supplements overall at this time.   Medications reviewed. Labs reviewed; CBGs: 137-202 mg/dL, BUN/creatinine elevated, Ca: 7.9 mg/dL, GFR: 27.    16/10 - Nutrition-related needs updated  based on current medical course, age.  - Per chart review, pt ate 50% of lunch 12/9, 75% of breakfast 12/10, and 50% of lunch 12/11.  - Pt states that he has not yet received breakfast this AM. - He does not recall trying Prostat supplement and states that his wife has mixed Juven powder into liquid in the past but he does not recall consuming this supplement either. - Pt seemed to have some slight confusion during discussion based on responses and talking about irrelevant information.  - Pt not meeting needs at this time.  - Noted pt missing the majority of his teeth. He states that he has trouble chewing foods and needs softer-texture foods related to this.   12/7 - Pt is s/p BKA 07/2013 - He now has a poorly healing diabetic foot ulcer on his 4th toe; appears to be infected; celluitis. - Pt also has Stg II pressure ulcer on buttocks. - Wife admits patient hasn't eaten well since Thanksgiving when "he first got sick." - Pt is feeling better now, most recent PO intake 75%. - Will provide Juven BID, PS Q24H to help with healing.    Diet Order:  Diet Carb Modified Fluid consistency:: Thin; Room service appropriate?: Yes  Skin:  Wound (see comment) (Stg II PU to buttocks, Infected 4th toe of foot.)  Last BM:  12/9  Height:   Ht Readings from Last 1 Encounters:  05/22/15  (1.778 m)    Weight:   Wt Readings from Last 1 Encounters:  05/23/15 284 lb 6.4 oz (129.003 kg)    Ideal Body Weight:  64.54 kg  BMI:  Body mass index is 40.81 kg/(m^2).  Estimated Nutritional Needs:   Kcal:  2000-2250  Protein:  115-125 grams  Fluid:  >/= 2.2L  EDUCATION NEEDS:   No education needs identified at this time     Trenton GammonJessica Rayfield Beem, RD, LDN Inpatient Clinical Dietitian Pager # [phone] After hours/weekend pager # [phone]

## 2015-05-24 LAB — CBC
HCT: 27.3 % — ABNORMAL LOW (ref 39.0–52.0)
Hemoglobin: 8.5 g/dL — ABNORMAL LOW (ref 13.0–17.0)
MCH: 27.1 pg (ref 26.0–34.0)
MCHC: 31.1 g/dL (ref 30.0–36.0)
MCV: 86.9 fL (ref 78.0–100.0)
PLATELETS: 291 10*3/uL (ref 150–400)
RBC: 3.14 MIL/uL — AB (ref 4.22–5.81)
RDW: 13.8 % (ref 11.5–15.5)
WBC: 12.7 10*3/uL — AB (ref 4.0–10.5)

## 2015-05-24 LAB — BASIC METABOLIC PANEL
Anion gap: 8 (ref 5–15)
BUN: 63 mg/dL — AB (ref 6–20)
CO2: 26 mmol/L (ref 22–32)
Calcium: 8.1 mg/dL — ABNORMAL LOW (ref 8.9–10.3)
Chloride: 109 mmol/L (ref 101–111)
Creatinine, Ser: 2.7 mg/dL — ABNORMAL HIGH (ref 0.61–1.24)
GFR, EST AFRICAN AMERICAN: 26 mL/min — AB (ref >60)
GFR, EST NON AFRICAN AMERICAN: 22 mL/min — AB (ref >60)
Glucose, Bld: 166 mg/dL — ABNORMAL HIGH (ref 65–99)
POTASSIUM: 4.8 mmol/L (ref 3.5–5.1)
SODIUM: 143 mmol/L (ref 135–145)

## 2015-05-24 LAB — GLUCOSE, CAPILLARY
GLUCOSE-CAPILLARY: 139 mg/dL — AB (ref 65–99)
GLUCOSE-CAPILLARY: 154 mg/dL — AB (ref 65–99)
Glucose-Capillary: 159 mg/dL — ABNORMAL HIGH (ref 65–99)

## 2015-05-24 MED ORDER — FUROSEMIDE 40 MG PO TABS: 40.0000 mg | ORAL_TABLET | Freq: Two times a day (BID) | ORAL | Status: DC

## 2015-05-24 MED ORDER — DOXYCYCLINE HYCLATE 100 MG PO TABS: 100.0000 mg | ORAL_TABLET | Freq: Two times a day (BID) | ORAL | Status: AC

## 2015-05-24 MED ORDER — FUROSEMIDE 40 MG PO TABS: 40.0000 mg | ORAL_TABLET | Freq: Two times a day (BID) | ORAL | Status: AC

## 2015-05-24 MED ORDER — POTASSIUM CHLORIDE ER 20 MEQ PO TBCR: 20.0000 meq | EXTENDED_RELEASE_TABLET | Freq: Two times a day (BID) | ORAL | Status: AC

## 2015-05-24 MED ORDER — HYDROCODONE-ACETAMINOPHEN 5-325 MG PO TABS: 1.0000 | ORAL_TABLET | Freq: Four times a day (QID) | ORAL | Status: AC | PRN

## 2015-05-24 NOTE — Discharge Instructions (Signed)
Keep pressure off of lateral aspect of right foot. Change dressing daily , wash foot with antibacterial soap. Do not submerge foot in water. Dry foot completely and apply new dry dressing.

## 2015-05-24 NOTE — Discharge Summary (Addendum)
Physician Discharge Summary  Justin Ortez Sr. ZOX:[id number] DOB: May 15, 1946 DOA: 05/14/2015  PCP: [name], MD  Admit date: 05/14/2015 Discharge date: 05/24/2015  Time spent: 45 minutes  Recommendations for Outpatient Follow-up:  1. Home with Hospice 2. PCP Dr.[name] in 1 -2weeks, titrate lasix dose if needed 3. FU with Dr.Blackman for R foot , s/p ray amputation 4.         Change dressing daily wash foot with antibacterial soap and dry completely then dressing foot with dry dressing.   Discharge Diagnoses:  Principal Problem:   Cellulitis of fourth toe of right foot   PVD with necrotic toes   Diabetic peripheral neuropathy (HCC)   Acute encephalopathy   S/P BKA (below knee amputation) unilateral (HCC)   History of pulmonary embolism   Acute renal failure superimposed on stage 4 chronic kidney disease (HCC)   Hyperkalemia   Metabolic acidosis   Uncontrolled diabetes mellitus with circulatory complication, with long-term current use of insulin (HCC)   Anemia of chronic renal failure, stage 4 (severe) (HCC)   Benign essential HTN   Chronic combined systolic and diastolic CHF (congestive heart failure) (HCC)   Leukocytosis   Acute renal failure (HCC)  Discharge Condition: stable  Diet recommendation: low sodium, diabetic  Filed Weights   05/21/15 0439 05/22/15 0400 05/23/15 0500  Weight: 131.4 kg (289 lb 11 oz) 131.8 kg (290 lb 9.1 oz) 129.003 kg (284 lb 6.4 oz)    History of present illness:  Chief Complaint: leg redness, pain, right 4th toe wound  HPI:  69 year old male with past medical history of hypertension, CKD stage 4 (baseline Cr 2.4), left BKA, history of pulmonary embolism (on anticoagulation with apixaban) diabetes mellitus on insulin, recently hospitalized for acute encephalopathy thought to be due to acute infection, UTI. He presented to [facility] ED with worsening mental status changes per his wife who provided most of the history of present illness. Mental  status changes started about 1-2 days prior to this admission. In addition, he had worsening wound on his right 4th toe over past few days and home nurse was concerned about possible infection  Hospital Course:  PVD with necrotic toes and Cellulitis of right foot - Patient initially presented with necrotic R 4th and 5th digits with infection.  - started on IV Antibiotics,  Right ABI performed on 12/7 and demonstrated severe loss of flow with abnormal waveforms and flat great toe.Dr. Magnus Ivan was consulted - Underwent R Foot third through fifth ray amputation/resection on 12/11 -continue wound care, overall prognosis very poor due to host of other issues too -continue Wound care, FU with Dr.Blackman -s/p Clindamycin for 9 days, changed to Po Doxycycline for 4 more days -will be followed by Hospice RN too -Per Ortho: Change dressing daily wash foot with antibacterial soap and dry completely then dressing foot with dry dressing.   Acute encephalopathy - Likely from acute infection, acute on chronic renal failure -improved and back to baseline   S/P left BKA (below knee amputation) unilateral (HCC) - Stable    History of pulmonary embolism - Patient is continued on apixaban post-op - Patient was on heparin gtt bridge prior to surgery   Acute renal failure superimposed on stage 4 chronic kidney disease (HCC) / Metabolic acidosis - Baseline Cr 2.4, with volume overload - Cr stable, Nephrology was consulted diuresed with high dose IV lasix gtt - Per Renal : Not candidate for HD due to severe comorbidities and debility - volume status improved  now on PO lasix 80mg  in am and 40mg  in pm   Hyperkalemia - Due to acute on chronic renal failure as well as potassium supplementation  - Given insulin, calcium gluconate and albuterol in ED - resolved   Uncontrolled diabetes mellitus with circulatory complication, with long-term current use of insulin (HCC) / Diabetic peripheral neuropathy  (HCC) - A1c on this admission 7.2 - Continue SSI coverage.    Anemia of chronic renal failure, stage 4 (severe) (HCC) - s/p one unit PRBC this admission, hb stable   Benign essential HTN - Continue Norvasc, carvedilol and clonidine   Acute on Chronic combined systolic and diastolic CHF (congestive heart failure) (HCC)  - Clinically mildly volume overloaded - Patient was diuresed with lasix gtt per Nephrology - Last 2 D ECHO in 03/2013 with EF 50% grade 1 diastolic dysfunction -volume status much improved, changed to PO lasix 2days ago -stable  GLOBAL: Very poor quality of life and multiple chronic illnesses,not HD candidate He was followed by Hospice at home and was discharged from hospice recently, was then followed by Palliative NP at Home, Wife understands very poor prognosis and Hospice resumed at discharge  Consultations:  Ortho Dr.Blackman  Renal Dr.Schertz  Discharge Exam: Filed Vitals:   05/23/15 2147 05/24/15 0514  BP: 141/18 124/47  Pulse: 69 68  Temp: 98.7 F (37.1 C) 97.7 F (36.5 C)  Resp: 18 18    General: AAOx3 Cardiovascular: S1S2/RRR Respiratory:CTAB  Discharge Instructions   Discharge Instructions    Diet - low sodium heart healthy    Complete by:  As directed      Increase activity slowly    Complete by:  As directed           Current Discharge Medication List    START taking these medications   Details  doxycycline (VIBRA-TABS) 100 MG tablet Take 1 tablet (100 mg total) by mouth every 12 (twelve) hours. For 4days Qty: 8 tablet, Refills: 0      CONTINUE these medications which have CHANGED   Details  furosemide (LASIX) 40 MG tablet Take 1-2 tablets (40-80 mg total) by mouth 2 (two) times daily. 80mg  in am and 40mg  in pm Qty: 60 tablet, Refills: 1    HYDROcodone-acetaminophen (NORCO/VICODIN) 5-325 MG tablet Take 1 tablet by mouth every 6 (six) hours as needed for moderate pain. Qty: 30 tablet, Refills: 0    potassium chloride 20  MEQ TBCR Take 20 mEq by mouth 2 (two) times daily. Qty: 60 tablet, Refills: 0      CONTINUE these medications which have NOT CHANGED   Details  amLODipine (NORVASC) 5 MG tablet Take 5 mg by mouth daily.    apixaban (ELIQUIS) 2.5 MG TABS tablet Take 2.5 mg by mouth 2 (two) times daily.    bisacodyl (BISACODYL) 5 MG EC tablet Take 5 mg by mouth daily as needed for mild constipation or moderate constipation.    carvedilol (COREG) 12.5 MG tablet Take 1 tablet (12.5 mg total) by mouth 2 (two) times daily with a meal. Qty: 60 tablet, Refills: 3    cloNIDine (CATAPRES) 0.1 MG tablet Take 0.1 mg by mouth 3 (three) times daily.     hydrOXYzine (ATARAX/VISTARIL) 10 MG tablet Take 10 mg by mouth 3 (three) times daily as needed for itching.    insulin aspart (NOVOLOG) 100 UNIT/ML injection Inject into the skin 3 (three) times daily with meals. SSI 200-300   7 UNITS; 301-400  9 UNITS; 401-500   12  UNITS; > 500 14  UNITS AND CALL MD   CBG AC AND QHS    liver oil-zinc oxide (DESITIN) 40 % ointment Apply topically 2 (two) times daily at 10 am and 4 pm. Qty: 56.7 g, Refills: 0    Tamsulosin HCl (FLOMAX) 0.4 MG CAPS Take 1 capsule (0.4 mg total) by mouth daily. Qty: 30 capsule, Refills: 3    traMADol (ULTRAM) 50 MG tablet Take 1 tablet (50 mg total) by mouth every 6 (six) hours as needed for moderate pain. Qty: 20 tablet, Refills: 0       Allergies  Allergen Reactions  . Ancef [Cefazolin] Itching and Rash  . Sulfa Antibiotics Itching and Rash   Follow-up Information    Follow up with Kathryne Hitch, MD. Schedule an appointment as soon as possible for a visit in 1 week.   Specialty:  Orthopedic Surgery   Contact information:   [address] [place] [address] [phone]       Follow up with [facility] K, MD. Schedule an appointment as soon as possible for a visit in 1 week.   Specialty:  Family Medicine   Contact information:   [address] [place]  [address] [phone]        The results of significant diagnostics from this hospitalization (including imaging, microbiology, ancillary and laboratory) are listed below for reference.    Significant Diagnostic Studies: US Renal  05/14/2015  CLINICAL DATA:  Acute renal failure EXAM: RENAL / URINARY TRACT ULTRASOUND COMPLETE COMPARISON:  04/07/2015 FINDINGS: Right Kidney: Length: 9.1 cm. Mild cortical thinning. Normal echogenicity. No hydronephrosis evident. Stable appearance. Left Kidney: Length: 10.3 cm. Similar mild cortical thinning. Normal echogenicity. Negative for hydronephrosis or obstruction. Bladder: Collapsed by Foley catheter. IMPRESSION: Mild cortical thinning. No acute finding or hydronephrosis by ultrasound. Electronically Signed   By: Judie Petit.  Shick M.D.   On: 05/14/2015 20:05   Dg Foot Complete Right  05/14/2015  CLINICAL DATA:  69 year old male with foot infection. Open wound at the fourth and fifth toes. Diabetes. Pain. Initial encounter. EXAM: RIGHT FOOT COMPLETE - 3+ VIEW COMPARISON:  02/10/2012. FINDINGS: Chronic but progressed demineralization in the distal metatarsals and phalanges. Widespread calcified peripheral vascular disease has progressed since 2013. No subcutaneous gas. Discrete soft tissue wounds are not evident. No left foot fracture, dislocation, or cortical osteolysis. Chronic degenerative changes at the first MTP joint. Chronic posttraumatic changes to the distal tibia and fibula with partially visible intra medullary rod. IMPRESSION: 1. No plain radiographic evidence of osteomyelitis at this time. No subcutaneous gas. 2. Progressed calcified peripheral vascular disease since 2013. Electronically Signed   By: Odessa Fleming M.D.   On: 05/14/2015 12:30    Microbiology: Recent Results (from the past 240 hour(s))  MRSA PCR Screening     Status: None   Collection Time: 05/14/15  4:26 PM  Result Value Ref Range Status   MRSA by PCR NEGATIVE NEGATIVE Final    Comment:         The GeneXpert MRSA Assay (FDA approved for NASAL specimens only), is one component of a comprehensive MRSA colonization surveillance program. It is not intended to diagnose MRSA infection nor to guide or monitor treatment for MRSA infections.      Labs: Basic Metabolic Panel:  Recent Labs Lab 05/19/15 0500 05/20/15 0420 05/21/15 0456 05/23/15 1135 05/24/15 0540  NA 140 136 141 143 143  K 3.9 3.9 3.6 3.8 4.8  CL 109 105 109 108 109  CO2 GLUCOSE 169* 193* 169* 127* 166*  BUN 42* 49* 52* 63* 63*  CREATININE 2.42* 2.81* 2.60* 2.79* 2.70*  CALCIUM 7.6* 7.5* 7.9* 7.9* 8.1*   Liver Function Tests: No results for input(s): AST, ALT, ALKPHOS, BILITOT, PROT, ALBUMIN in the last 168 hours. No results for input(s): LIPASE, AMYLASE in the last 168 hours. No results for input(s): AMMONIA in the last 168 hours. CBC:  Recent Labs Lab 05/18/15 0306 05/19/15 0500 05/20/15 0420 05/21/15 0456 05/24/15 0540  WBC 11.4* 9.9 10.2 10.0 12.7*  HGB 7.4* 8.3* 8.7* 8.2* 8.5*  HCT 22.9* 25.4* 26.4* 24.7* 27.3*  MCV 85.1 82.7 83.0 82.9 86.9  PLT 267 269 276 253 291   Cardiac Enzymes: No results for input(s): CKTOTAL, CKMB, CKMBINDEX, TROPONINI in the last 168 hours. BNP: BNP (last 3 results)  Recent Labs  04/05/15 1439  BNP 175.9*    ProBNP (last 3 results) No results for input(s): PROBNP in the last 8760 hours.  CBG:  Recent Labs Lab 05/23/15 1151 05/23/15 1659 05/23/15 2148 05/24/15 0730 05/24/15 1125  GLUCAP 117* 151* 172* 139* 159*       Signed:  Rainen Vanrossum  [facility] 05/24/2015, 1:27 PM

## 2015-05-24 NOTE — Progress Notes (Addendum)
[id number]  [facility],Amy/willm schedule and call me back/Rhonda Davis,RN,BSN,CCM

## 2015-05-24 NOTE — Progress Notes (Signed)
Pt to be d/c today to home residence.  [address]  [place] [id number]  Pt and family agreeable. Confirmed plans with facility.  Plan transfer via EMS.    Leron Croakassandra Amiera Herzberg [facility]  [facility]  [address] 1[address];  [phone] Phone: [phone]

## 2015-05-24 NOTE — Progress Notes (Signed)
Patient and wife notified of discharge.  CM arranged home hospice, per patient's wife RN plans to meet patient at 11:00 on 12/17.  Notified patient of discharge.  [facility] arranged for patient transportation.  Packet sent with [facility] including patient's prescriptions, discharge instructions, and follow-up appointments.  Wife called to notify that patient was on the way home.  Patient has no IV access to discontinue.

## 2015-05-24 NOTE — Progress Notes (Signed)
Subjective: 5 Days Post-Op Procedure(s) (LRB): 3rd-5th RAY AMPUTATION RIGHT FOOT (Right) Patient reports pain as moderate.    Objective: Vital signs in last 24 hours: Temp:  [97.7 F (36.5 C)-98.9 F (37.2 C)] 98.9 F (37.2 C) (12/16 1331) Pulse Rate:  [68-72] 72 (12/16 1331) Resp:  [18] 18 (12/16 1331) BP: (118-141)/(18-49) 118/49 mmHg (12/16 1331) SpO2:  [96 %-100 %] 98 % (12/16 1331)  Intake/Output from previous day: 12/15 0701 - 12/16 0700 In: -  Out: 1350 [Urine:1350] Intake/Output this shift:     Recent Labs  05/24/15 0540  HGB 8.5*    Recent Labs  05/24/15 0540  WBC 12.7*  RBC 3.14*  HCT 27.3*  PLT 291    Recent Labs  05/23/15 1135 05/24/15 0540  NA 143 143  K 3.8 4.8  CL 108 109  CO2 24 26  BUN 63* 63*  CREATININE 2.79* 2.70*  GLUCOSE 127* 166*  CALCIUM 7.9* 8.1*   No results for input(s): LABPT, INR in the last 72 hours.  Right foot: Wound approximated with interrupted sutures . No expressible purulence. No edema or erythema.  Sensation intact distally  Assessment/Plan: 5 Days Post-Op Procedure(s) (LRB): 3rd-5th RAY AMPUTATION RIGHT FOOT (Right) Follow up in office with Dr. Magnus IvanBlackman next week. Change dressing daily wash foot with antibacterial soap and dry completely then dressing foot with dry dressing.  Richardean CanalCLARK, GILBERT 05/24/2015, 3:29 PM

## 2015-07-10 DEATH — deceased

## 2015-09-14 NOTE — Addendum Note (Signed)
Addended by: Kirt BoysARTER, Katharin Schneider on: 09/14/2015 10:17 PM   Modules accepted: Level of Service
